# Patient Record
Sex: Female | Born: 1945 | Race: White | Hispanic: No | Marital: Married | Smoking: Former smoker
Health system: Southern US, Community
[De-identification: ages and names within clinical notes are randomized; demographics above are authoritative.]

## PROBLEM LIST (undated history)

## (undated) DIAGNOSIS — M47817 Spondylosis without myelopathy or radiculopathy, lumbosacral region: Secondary | ICD-10-CM

## (undated) DIAGNOSIS — M48061 Spinal stenosis, lumbar region without neurogenic claudication: Secondary | ICD-10-CM

## (undated) DIAGNOSIS — G479 Sleep disorder, unspecified: Secondary | ICD-10-CM

## (undated) DIAGNOSIS — G4730 Sleep apnea, unspecified: Secondary | ICD-10-CM

## (undated) DIAGNOSIS — I10 Essential (primary) hypertension: Secondary | ICD-10-CM

## (undated) DIAGNOSIS — R159 Full incontinence of feces: Secondary | ICD-10-CM

## (undated) DIAGNOSIS — R519 Headache, unspecified: Secondary | ICD-10-CM

## (undated) DIAGNOSIS — Q675 Congenital deformity of spine: Secondary | ICD-10-CM

## (undated) DIAGNOSIS — D649 Anemia, unspecified: Secondary | ICD-10-CM

## (undated) DIAGNOSIS — M545 Low back pain, unspecified: Secondary | ICD-10-CM

## (undated) DIAGNOSIS — F32A Depression, unspecified: Secondary | ICD-10-CM

## (undated) HISTORY — PX: COLONOSCOPY: SHX174

## (undated) HISTORY — DX: Spondylosis without myelopathy or radiculopathy, lumbosacral region: M47.817

## (undated) HISTORY — PX: AUGMENTATION MAMMAPLASTY: SUR837

## (undated) HISTORY — DX: Spinal stenosis, lumbar region without neurogenic claudication: M48.061

## (undated) HISTORY — DX: Full incontinence of feces: R15.9

## (undated) HISTORY — DX: Congenital deformity of spine: Q67.5

## (undated) HISTORY — PX: BREAST EXCISIONAL BIOPSY: SUR124

## (undated) HISTORY — DX: Low back pain: M54.5

## (undated) HISTORY — DX: Low back pain, unspecified: M54.50

## (undated) HISTORY — PX: OTHER SURGICAL HISTORY: SHX169

---

## 2006-08-20 ENCOUNTER — Encounter: Admission: RE | Admit: 2006-08-20 | Discharge: 2006-09-21 | Payer: Self-pay | Admitting: Family Medicine

## 2006-08-23 ENCOUNTER — Encounter: Admission: RE | Admit: 2006-08-23 | Discharge: 2006-08-23 | Payer: Self-pay | Admitting: Family Medicine

## 2006-11-05 ENCOUNTER — Other Ambulatory Visit: Admission: RE | Admit: 2006-11-05 | Discharge: 2006-11-05 | Payer: Self-pay | Admitting: Family Medicine

## 2006-11-16 ENCOUNTER — Encounter: Admission: RE | Admit: 2006-11-16 | Discharge: 2006-11-16 | Payer: Self-pay | Admitting: Family Medicine

## 2007-08-26 ENCOUNTER — Encounter: Admission: RE | Admit: 2007-08-26 | Discharge: 2007-08-26 | Payer: Self-pay | Admitting: Family Medicine

## 2007-12-11 ENCOUNTER — Other Ambulatory Visit: Admission: RE | Admit: 2007-12-11 | Discharge: 2007-12-11 | Payer: Self-pay | Admitting: Family Medicine

## 2007-12-12 ENCOUNTER — Encounter: Admission: RE | Admit: 2007-12-12 | Discharge: 2007-12-23 | Payer: Self-pay | Admitting: Orthopedic Surgery

## 2007-12-17 ENCOUNTER — Ambulatory Visit (HOSPITAL_COMMUNITY): Admission: RE | Admit: 2007-12-17 | Discharge: 2007-12-17 | Payer: Self-pay | Admitting: Family Medicine

## 2007-12-20 ENCOUNTER — Encounter
Admission: RE | Admit: 2007-12-20 | Discharge: 2008-03-19 | Payer: Self-pay | Admitting: Physical Medicine & Rehabilitation

## 2007-12-23 ENCOUNTER — Ambulatory Visit: Payer: Self-pay | Admitting: Physical Medicine & Rehabilitation

## 2007-12-26 ENCOUNTER — Encounter: Admission: RE | Admit: 2007-12-26 | Discharge: 2007-12-26 | Payer: Self-pay | Admitting: Family Medicine

## 2007-12-26 ENCOUNTER — Other Ambulatory Visit: Admission: RE | Admit: 2007-12-26 | Discharge: 2007-12-26 | Payer: Self-pay | Admitting: Interventional Radiology

## 2007-12-26 ENCOUNTER — Encounter (INDEPENDENT_AMBULATORY_CARE_PROVIDER_SITE_OTHER): Payer: Self-pay | Admitting: Interventional Radiology

## 2007-12-26 ENCOUNTER — Ambulatory Visit: Payer: Self-pay | Admitting: Physical Medicine & Rehabilitation

## 2008-01-06 ENCOUNTER — Ambulatory Visit: Payer: Self-pay | Admitting: Physical Medicine & Rehabilitation

## 2008-01-13 ENCOUNTER — Ambulatory Visit: Payer: Self-pay | Admitting: Physical Medicine & Rehabilitation

## 2008-01-16 ENCOUNTER — Ambulatory Visit: Payer: Self-pay | Admitting: Physical Medicine & Rehabilitation

## 2008-01-23 ENCOUNTER — Ambulatory Visit: Payer: Self-pay | Admitting: Physical Medicine & Rehabilitation

## 2008-02-13 ENCOUNTER — Ambulatory Visit: Payer: Self-pay | Admitting: Physical Medicine & Rehabilitation

## 2008-03-13 ENCOUNTER — Ambulatory Visit: Payer: Self-pay | Admitting: Physical Medicine & Rehabilitation

## 2008-04-20 ENCOUNTER — Encounter
Admission: RE | Admit: 2008-04-20 | Discharge: 2008-04-20 | Payer: Self-pay | Admitting: Physical Medicine & Rehabilitation

## 2008-04-20 ENCOUNTER — Ambulatory Visit: Payer: Self-pay | Admitting: Physical Medicine & Rehabilitation

## 2008-07-15 ENCOUNTER — Encounter
Admission: RE | Admit: 2008-07-15 | Discharge: 2008-07-15 | Payer: Self-pay | Admitting: Physical Medicine & Rehabilitation

## 2008-08-26 ENCOUNTER — Encounter: Admission: RE | Admit: 2008-08-26 | Discharge: 2008-08-26 | Payer: Self-pay | Admitting: Family Medicine

## 2008-09-17 ENCOUNTER — Ambulatory Visit: Payer: Self-pay | Admitting: Physical Medicine & Rehabilitation

## 2008-09-17 ENCOUNTER — Encounter
Admission: RE | Admit: 2008-09-17 | Discharge: 2008-09-17 | Payer: Self-pay | Admitting: Physical Medicine & Rehabilitation

## 2009-01-18 ENCOUNTER — Ambulatory Visit: Payer: Self-pay | Admitting: Physical Medicine & Rehabilitation

## 2009-01-18 ENCOUNTER — Encounter
Admission: RE | Admit: 2009-01-18 | Discharge: 2009-04-18 | Payer: Self-pay | Admitting: Physical Medicine & Rehabilitation

## 2009-04-12 ENCOUNTER — Ambulatory Visit: Payer: Self-pay | Admitting: Physical Medicine & Rehabilitation

## 2009-06-29 ENCOUNTER — Encounter
Admission: RE | Admit: 2009-06-29 | Discharge: 2009-07-05 | Payer: Self-pay | Source: Home / Self Care | Admitting: Physical Medicine & Rehabilitation

## 2009-07-05 ENCOUNTER — Ambulatory Visit: Payer: Self-pay | Admitting: Physical Medicine & Rehabilitation

## 2009-09-06 ENCOUNTER — Encounter: Admission: RE | Admit: 2009-09-06 | Discharge: 2009-09-06 | Payer: Self-pay | Admitting: Family Medicine

## 2010-04-21 ENCOUNTER — Other Ambulatory Visit: Payer: Self-pay | Admitting: Family Medicine

## 2010-04-21 ENCOUNTER — Other Ambulatory Visit (HOSPITAL_COMMUNITY)
Admission: RE | Admit: 2010-04-21 | Discharge: 2010-04-21 | Disposition: A | Discharge status: Home / Self Care | Payer: Federal, State, Local not specified - PPO | Source: Ambulatory Visit | Attending: Family Medicine | Admitting: Family Medicine

## 2010-04-21 DIAGNOSIS — Z124 Encounter for screening for malignant neoplasm of cervix: Secondary | ICD-10-CM | POA: Insufficient documentation

## 2010-05-24 NOTE — Procedures (Signed)
[name], EBRIGHT NO.:  [id number]   MEDICAL RECORD NO.:  [id number]          PATIENT TYPE:  SPE   LOCATION:  [facility]                         FACILITY:  [facility]   PHYSICIAN:  Erick Colace, M.D.DATE OF BIRTH:  [birth year]   DATE OF PROCEDURE:  12/26/2007  DATE OF DISCHARGE:  12/26/2007                               OPERATIVE REPORT   This 65 year old female with history of scoliosis with back pain here  for acupuncture treatment.  This is treatment #3.  Last treatment done  December 26, 2007.   No new intercurrent medical problems.  Needle was placed at BL23, BL24,  BL52, DU4, and right lower extremity SP6 GB39, GB34 with e-stim between  GB34 and GB39, 4 Hz stimulation x30 minutes.  The patient tolerated  procedure well.  Post-procedure instructions given.  Return in 1 week.      Erick Colace, M.D.  Electronically Signed     AEK/MEDQ  D:  01/06/2008 15:18:39  T:  01/07/2008 02:26:15  Job:  [id number]

## 2010-05-24 NOTE — Consult Note (Signed)
A 65 year old female who has a history of scoliosis even as a teenager,  but has had some progressive pain over the last several years along with  right lower extremity pain and tingling as well.  She has seen  orthopedic surgeon in [place] couple of years ago when she lived  there and he recommended laminectomy.  Prior to getting laminectomy, he  had her go to a acupuncturist and in fact after twice a week treatments  for about 5 weeks, she had resolution of her pain to the point where she  decided not to go through with her surgery.  She has then moved to [place] with her husband.  She is retired.  She has used intermittent  morphine with the last dosage about 3 weeks ago to help with her pain in  addition to using Motrin.   Her past medical history is significant for GERD, depression, panic  attacks, hypertension, high cholesterol, and arthritis.   Current meds include Pristiq, Abilify, Crestor, hydrochlorothiazide,  Neurontin, and Prilosec.  She is also taking Naprelan 2 a day.   Her Oswestry disability index is 46%.   Pain level was 6/10 on average, but currently only 2.  Pain interferes  with activity at a 7/10 level.  Walking tolerance is about 10 minutes.  She needs assistance with certain shopping activities.   REVIEW OF SYSTEMS:  Positive depression, anxiety, and coughing as well  as weight gain due to poor exercise per her report.   PAST SURGICAL HISTORY:  Bunionectomies 1985 and 1995, breast  augmentation in the past.   SOCIAL HISTORY:  Married.  Lives with her husband.  Drinks 1-2 glasses  of wine a night.   FAMILY HISTORY:  Diabetes, high blood pressure, psychiatric problems,  alcohol abuse.   PHYSICAL EXAMINATION:  GENERAL:  Overweight female in no acute distress.  EXTREMITIES:  Without edema.  Orientation x3.  Affect is alert and  bright.  Gait is normal.  She is able to toe walk, heel walk, but has a  forward-flexed posture.  Coordination is normal  in bilateral upper and  lower extremities.  Deep tendon reflexes are normal in bilateral upper  and lower extremities.  Right lower extremity sensation reduced in the  L4 and L5 dermatomes.  She has normal range of motion and joint  stability in her upper and lower extremities and 5/5 strength in  bilateral upper and lower extremities.  Lumbar spine range of motion,  full range, forward flexion; however, extension is limited to 0 to 25%.  Her spine does have some evidence of scoliosis.  She has mild tenderness  to palpation of lumbar paraspinals.   IMPRESSION:  Scoliosis as well as lumbar stenosis.  I believe she has  some facet-mediated pain as well as right lower extremity radicular pain  corresponding L4 and L5 levels.   We discussed her acupuncture treatments in the past.  She did well using  twice a week treatments.  She did not use E-Stim with that particular  treatment.  She had some subsequent treatments using E-Stim with  chiropractor, some different points were trailed, did not do quite as  well.  She states the [place] acupuncturist did some manual type of  therapy as well.   We will initiate treatment.  We will do Ming-Men treatments a day and  scheduled for twice a week treatments for 3 weeks as a trial.   Discussed with the patient, she elects to proceed, 20-minute treatment  today, subsequent treatments will be scheduled for 30 minutes.      Erick Colace, M.D.  Electronically Signed     AEK/MedQ  D:12/23/2007 13:09:37  T:12/24/2007 03:39:07  Job #:  [id number]   cc:   Caralyn Guile. Ethelene Hal, M.D.  Fax: [phone]   Alvy Beal, MD  Fax: [phone]

## 2010-05-24 NOTE — Procedures (Signed)
NAMEDONNITA, [name] NO.:  [id number]   MEDICAL RECORD NO.:  [id number]           PATIENT TYPE:   LOCATION:                                 FACILITY:   PHYSICIAN:  Erick Colace, M.D.DATE OF BIRTH:  07-Jun-1945   DATE OF PROCEDURE:  04/20/2008  DATE OF DISCHARGE:                               OPERATIVE REPORT   PROCEDURE:  Bilateral L5 dorsal ramus injection, bilateral L4 medial  branch block, bilateral L3 medial branch block under fluoroscopic  guidance.   INDICATIONS:  Lumbar pain which was previously relieved for 6-7 weeks  with medial branch blocks at corresponding levels.   The pain is only partially responsive to medication management and  interferes with self-care mobility.   Informed consent was obtained after describing the risks and benefits of  the procedure with the patient.  These include bleeding, bruising, and  infection.  She elects to proceed and has given written consent.   The patient was placed prone on fluoroscopy table.  Betadine prep,  sterile drape.  A 25-gauge 1-1/2-inch needle was used to anesthetize the  skin and subcutaneous tissue, 1% lidocaine x2 mL at each of 6 sites.  Then, a 22-gauge 3-1/2-inch spinal needle was inserted under  fluoroscopic guidance targeting left S1 SAP sacral ala junction.  Bone  contact made, confirmed with lateral imaging.  Omnipaque 180 x0.5 mL demonstrated no intravascular uptake.  Then, 0.5  mL of dexamethasone and lidocaine solution was injected.  Then, the left  L5 SAP transverse junction targeted.  Bone contact made, confirmed with  lateral imaging.  Omnipaque 180 x0.5 mL demonstrated no intravascular  uptake.  Then, 0.5 mL of dexamethasone and lidocaine solution injected.  Then, the left L4 SAP transverse junction targeted.  Bone contact made,  confirmed with lateral imaging.  Omnipaque 180 x0.5 mL demonstrated no  intravascular uptake.  Then, 0.5 mL of dexamethasone and lidocaine  solution  was injected.  The same procedure was repeated on the right  side using the same needle and injectate technique.  The patient  tolerated procedure well.  Pre- and post-injection vitals stable.  Post-  injection instructions given.  Pre-injection pain level 4/10, post-  injection 0/10.  Return in 3 months for reinjection.  If she has a  recurrence prior to 6 weeks post-injection, we will set her up for  radiofrequency neurotomy.      Erick Colace, M.D.  Electronically Signed     AEK/MEDQ  D:  04/20/2008 12:18:05  T:  04/21/2008 02:15:38  Job:  [id number]

## 2010-05-24 NOTE — Assessment & Plan Note (Signed)
HISTORY OF PRESENT ILLNESS:  Ms. Freeman follows up today.  She was  thinking, she was here for radiofrequency neurotomy; however, she  continues to have pain relief and only has 0-1 pain currently.  She has  had good relief again with the lumbar medial branch block, L5 dorsal  ramus, L4 medial branch, L3 medial branch bilaterally performed February 13, 2008.   She can cook for a few hours at a time.  Her main pain is when she is in  a bent forward position for a prolonged of period time.  She walks  without assistive device.  She has turned to working out in a pool.  She  climbs steps.  She drives.   REVIEW OF SYSTEMS:  Positive for depression, but otherwise negative.   PHYSICAL EXAMINATION:  VITAL SIGNS:  Her blood pressure is 142/92, pulse  74, respirations 18, O2 sat 97% room air.  GENERAL:  In no acute stress.  Orientation x3.  Affect bright and alert.  Gait is normal.  She is able to toe walk, heel walk.  Her back has no tenderness to  palpation.  Lower extremity strength and range of motion are normal.  Deep tendon reflexes normal.   IMPRESSION:  Chronic low back pain, facet mediated, good response to  medial branch blocks.  At this point, do not know how long this is going  to last her and if she has a prolonged effect from this, she may be a  good candidate for repeat MBB every 3 months or so.  If she has a  shorter response, i.e. 6 weeks or less, would be a good candidate for  radiofrequency neurotomy.  I will see her back in another 6 weeks.  We  will see how she is doing.  If she has gradually returning pain at that  point, we will repeat MBB.  If she has a more pronounce return of pain  within the next couple weeks, we will schedule her for radiofrequency  procedure.      Erick Colace, M.D.  Electronically Signed     AEK/MedQ  D:  03/13/2008 16:10:07  T:  03/14/2008 16:10:96  Job #:  [id number]

## 2010-05-24 NOTE — Procedures (Signed)
[name], [name] NO.:  [id number]   MEDICAL RECORD NO.:  [id number]           PATIENT TYPE:   LOCATION:                                 FACILITY:   PHYSICIAN:  Erick Colace, M.D.DATE OF BIRTH:  Sep 12, 1945   DATE OF PROCEDURE:  DATE OF DISCHARGE:                               OPERATIVE REPORT   Acupuncture treatment.  Treatment #5 today.  She has had good results  with the right lower extremity pain, but continues to have back pain.   Needles placed bilaterally, BL23, BL24, BL52, as well as DU4 and GB34,  GB39, and SP6 with E-Stim between SP6 and GB39 as well as between the  ming-men points.   The patient tolerated the procedure well.  Postinjection instructions  given.      Erick Colace, M.D.  Electronically Signed     AEK/MEDQ  D:  01/13/2008 14:58:03  T:  01/14/2008 04:44:03  Job:  [id number]

## 2010-05-24 NOTE — Procedures (Signed)
[name], [name] NO.:  [id number]   MEDICAL RECORD NO.:  [id number]           PATIENT TYPE:   LOCATION:                                 FACILITY:   PHYSICIAN:  Erick Colace, M.D.   DATE OF BIRTH:   DATE OF PROCEDURE:  DATE OF DISCHARGE:                               OPERATIVE REPORT   PROCEDURE:  Acupuncture treatment.   INDICATIONS:  Lumbar pain as well as right lower extremity pain.   Needles placed at ming-men site with additional placement of BL 24  bilaterally.  In addition, in the lower extremity needles placed at GB  31, GB 39, and SP 6, E-Stim between SP 6 and GB 39.  The patient  tolerated the procedure well.  Post procedure instructions given.  Return next week.      Erick Colace, M.D.  Electronically Signed     AEK/MEDQ  D:  12/26/2007 10:23:14  T:  12/27/2007 04:07:10  Job:  [id number]

## 2010-05-24 NOTE — Assessment & Plan Note (Signed)
Ms. Summa returns today.  She has had good results with the radicular  pain in regards to the acupuncture; however, continues to have back pain  mainly with activity.  Standing and walking seem to bother it most.  Her  walking tolerance is only about 15 minutes because of the pain, she sits  down and her pain is relieved.  Her current pain is 2/10, average is 7.   REVIEW OF SYSTEMS:  Positive for depression.  She states she has gained  weight because her back hurts and she cannot exercise.   PHYSICAL EXAMINATION:  VITAL SIGNS:  Blood pressure is 143/82, pulse 83,  respiratory rate 18, and O2 sat 96% on room air.  GENERAL:  No acute distress.  She is obese.  EXTREMITIES:  Without edema.  NEUROLOGIC:  Mood and affect is appropriate.  Her gait is normal.   IMPRESSION:  Lumbar pain.  I looked over at some of her previous records  from [facility].  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   We discussed the treatment options including to continue with the  acupuncture, which seems to be helping with radicular component, but not  with the actual component of her pain, which is particularly worse with  prolonged standing.  I discussed all of medial branch block and its  further diagnostic procedure and she would like to proceed with this.  I  will schedule for this in the next week or two.  I already gave her some  literature on this.      Erick Colace, M.D.  Electronically Signed     AEK/MedQ  D:  01/13/2008 15:01:27  T:  01/14/2008 05:57:08  Job #:  [id number]

## 2010-05-24 NOTE — Procedures (Signed)
NAMEANMOL, PASCHEN NO.:  [id number]   MEDICAL RECORD NO.:  [id number]           PATIENT TYPE:   LOCATION:                                 FACILITY:   PHYSICIAN:  Erick Colace, M.D.DATE OF BIRTH:  Oct 24, 1945   DATE OF PROCEDURE:  DATE OF DISCHARGE:                               OPERATIVE REPORT   Acupuncture treatment number 6 has had good results with right lower  extremity pain.  Continues to have some back pain, although this has  improved to certain extent in the mid to upper back.  Having some right  buttock pain.   Needles placed bilaterally at BL23, BL24, BL52 as well as DU4 as well as  right GB34, right GB39, right SP6, and two needles at right GB30, 4 Hz  stimulation between the points with the exception of DU4, 30 minutes  treatment time.  The patient tolerated the procedure well.  The patient  has pain that interferes with prolonged standing and walking and only  partially responsive to medication management.  We would estimate she  will need continued acupuncture treatment week x1 more week and then  start spreading her out to  every 2 weeks to see if we can maintain at  that level and proceed from there.      Erick Colace, M.D.  Electronically Signed     AEK/MEDQ  D:  01/16/2008 17:39:26  T:  01/17/2008 05:00:30  Job:  [id number]

## 2010-05-24 NOTE — Procedures (Signed)
[name], [name] NO.:  [id number]   MEDICAL RECORD NO.:  [id number]           PATIENT TYPE:   LOCATION:                                 FACILITY:   PHYSICIAN:  Erick Colace, M.D.DATE OF BIRTH:  07-30-1945   DATE OF PROCEDURE:  01/23/2008  DATE OF DISCHARGE:                               OPERATIVE REPORT   PROCEDURE:  Bilateral L5 dorsal ramus injection, bilateral L4 medial  branch block, bilateral L3 medial branch block under fluoroscopic  guidance.   INDICATIONS:  Lumbar pain, lumbago, 724.2.   Informed consent was obtained after describing risks and benefits of the  procedure with the patient.  These include bleeding, bruising,  infection.  She elects to proceed and has given written consent.  The  patient placed prone on fluoroscopy table.  Betadine prep, sterile  drape.  A 25-gauge inch and a half needle was used to anesthetize the  skin and subcutaneous tissue, 1% lidocaine x2 mL.  Then, a 22-gauge 3-  1/2-inch spinal needle was inserted under fluoroscopic guidance first  targeting the left S1 SAP sacral ala junction, bone contact made,  confirmed on lateral imaging.  Omnipaque 180 x 0.5 mL demonstrated no  intravascular uptake.  Then, 0.5 mL of a solution containing 1 mL of 4  mg/mL dexamethasone and 2 mL of 1% MPF lidocaine were injected.  Then,  the left L5 SAP transverse process junction targeted, bone contact made,  confirmed.  Omnipaque 180 under live fluoro demonstrated no  intravascular uptake.  Then, 0.5 mL of a dexamethasone lidocaine  solution was injected into the left L4 SAP transverse process junction  targeted, bone contact made, confirmed with lateral imaging.  Omnipaque  180 x0.5 mL demonstrated no intravascular uptake.  Then, 0.5 mL of a  dexamethasone lidocaine solution was injected.  The same procedure was  repeated on the right side using same needle injectate and technique.  At the L5-S1 level, needle was switched out to  a 5-inch 22-gauge needle  due to the depth of adipose tissue.   The patient tolerated the procedure well.  Pre and postinjection vitals  stable.  Postinjection instructions given.  Preinjection pain level  4/10.  Postinjection pain level 0/10.  We will schedule back in 3 weeks  for repeat procedure.      Erick Colace, M.D.  Electronically Signed     AEK/MEDQ  D:  01/23/2008 08:53:40  T:  01/23/2008 22:04:17  Job:  [id number]

## 2010-05-24 NOTE — Procedures (Signed)
[name], [name] NO.:  [id number]   MEDICAL RECORD NO.:  [id number]          PATIENT TYPE:  SPE   LOCATION:  [facility]                         FACILITY:  [facility]   PHYSICIAN:  Erick Colace, M.D.DATE OF BIRTH:  Feb 07, 1945   DATE OF PROCEDURE:  02/13/2008  DATE OF DISCHARGE:  12/26/2007                               OPERATIVE REPORT   PROCEDURE:  Bilateral L5 dorsal ramus injection, bilateral L4 medial  branch block, bilateral L3 medial branch block under fluoroscopic  guidance.   INDICATIONS:  Lumbar pain, lumbago of 724.2.   Informed consent was obtained after describing the risks and benefits of  the procedure with the patient.  These include bleeding, bruising,  infection.  She elects to proceed and has given written consent.  She  had good results with the last injection.  No complications.  Her pain  is coming back and she notes that she has had moderate pain with  standing now.   The patient placed prone on fluoroscopy table.  Betadine prep, sterile  drape 25-gauge inch and half needle was used to anesthetize skin and  subcu tissue 1% lidocaine x2 mL and then a 22-gauge 2-1/2-inch spinal  needle was inserted under fluoroscopic guidance, first charting at the  left S1 SAP sacroiliac junction.  Bone contact made and confirmed with  lateral imaging.  Omnipaque 180 x 0.5 mL demonstrated no intravascular  uptake, then 0.5 mL of a solution containing 1 mL of 4 mL dexamethasone  and 2 mL of 2% MPF lidocaine, then the left L5 SAP transverse process  junction targeted, bone contact made, confirmed with lateral imaging.  Omnipaque 180 x 0.5 mL demonstrated no intravascular uptake, then 0.5 mL  of dexamethasone lidocaine solution was injected, then left L4 SAP  transverse process junction targeted, bone contact made, confirmed with  lateral imaging.  Omnipaque 180 x 0.5 mL demonstrated no intravascular  uptake, then 0.5 mL of the dexamethasone lidocaine  solution was  injected.  The same procedure was repeated on the right side using same  needle and injectate and technique.  She did have a 5-inch 42-gauge  needle at L5-S1 level to compensate for the increased depth of the  adipose tissue at that level.   Pre and post-injection vitals stable.  Post-injection instructions  given.  I will see her back in 1 month.  We will follow up on how she  did with this and decide whether to proceed with radiofrequency  neurotomy based on her results.      Erick Colace, M.D.  Electronically Signed     AEK/MEDQ  D:  02/13/2008 11:14:19  T:  02/14/2008 00:02:48  Job:  [id number]

## 2010-05-27 NOTE — Procedures (Signed)
[name], [name]             ACCOUNT NO.:  [id number]   MEDICAL RECORD NO.:  [id number]           PATIENT TYPE:   LOCATION:                                 FACILITY:   PHYSICIAN:  Erick Colace, M.D.DATE OF BIRTH:  10/12/1945   DATE OF PROCEDURE:  09/17/2008  DATE OF DISCHARGE:                               OPERATIVE REPORT   PROCEDURE:  Bilateral L5 dorsal ramus injection, bilateral L4 medial  branch block, bilateral L3 medial branch block under fluoroscopic  guidance.   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  Her pain is in the 7/10 range right now.  Interferes with activity at a  6/10 range.  Her pain is worse with walking and sitting, and persists  despite medication management, other conservative care including an  exercise program.   Informed consent was obtained after describing the risks and benefits of  the procedure with the patient.  These include bleeding, bruising, and  infection.  She elects to proceed and has given written consent.  The  patient was placed prone on fluoroscopy table.  Betadine prep, sterile  draped.  A 25-gauge, 1-1/2-inch needle was used to anesthetize the skin  and subcutaneous tissue with 1% lidocaine x2 mL and a 22-gauge, 3-1/2-  inch spinal needle was inserted under fluoroscopic guidance, first  starting the left S1 SAP sacral ala junction, bone contact made  confirmed with lateral imaging.  Omnipaque 180 under live fluoro  demonstrated no intravascular uptake, then 0.5 mL of Omnipaque 180  injected under live fluoro followed by showing no intravascular uptake,  followed by injection of 0.5 mL with solution containing 1 mL of 4 mg/mL  dexamethasone and 2 mL of 2% MPF lidocaine.  Then, left L5 SAP  transverse process junction targeted, bone contact made confirmed with  lateral imaging.  Omnipaque 180 under live fluoro demonstrated no  intravascular uptake, then 0.5 mL of dexamethasone-lidocaine  solution  injected.  Then, left L4 SAP transverse process junction targeted, bone  contact made.  Omnipaque 180 x 0.5 mL demonstrated no intravascular  uptake.  Then, 0.5 mL of dexamethasone-lidocaine solution was injected.  Same procedure was repeated on the right side on corresponding levels,  same needle, injectate and technique.  The patient tolerated the  procedure well.  Pre- and post-injection and vitals stable.  Post-  injection instructions given.      Erick Colace, M.D.  Electronically Signed     AEK/MEDQ  D:  09/17/2008 10:36:40  T:  09/18/2008 01:32:30  Job:  [id number]   cc:   Alvy Beal, MD  Fax: [phone]

## 2010-10-03 ENCOUNTER — Other Ambulatory Visit: Payer: Self-pay | Admitting: Family Medicine

## 2010-10-03 DIAGNOSIS — Z1231 Encounter for screening mammogram for malignant neoplasm of breast: Secondary | ICD-10-CM

## 2010-10-12 ENCOUNTER — Ambulatory Visit
Admission: RE | Admit: 2010-10-12 | Discharge: 2010-10-12 | Disposition: A | Discharge status: Home / Self Care | Payer: Federal, State, Local not specified - PPO | Source: Ambulatory Visit | Attending: Family Medicine | Admitting: Family Medicine

## 2010-10-12 DIAGNOSIS — Z1231 Encounter for screening mammogram for malignant neoplasm of breast: Secondary | ICD-10-CM

## 2011-01-10 HISTORY — PX: SHOULDER SURGERY: SHX246

## 2011-02-10 ENCOUNTER — Encounter: Payer: Federal, State, Local not specified - PPO | Attending: Physical Medicine & Rehabilitation

## 2011-02-10 ENCOUNTER — Ambulatory Visit (HOSPITAL_BASED_OUTPATIENT_CLINIC_OR_DEPARTMENT_OTHER): Payer: Federal, State, Local not specified - PPO | Admitting: Physical Medicine & Rehabilitation

## 2011-02-10 DIAGNOSIS — M79609 Pain in unspecified limb: Secondary | ICD-10-CM | POA: Insufficient documentation

## 2011-02-10 DIAGNOSIS — Z79899 Other long term (current) drug therapy: Secondary | ICD-10-CM | POA: Insufficient documentation

## 2011-02-10 DIAGNOSIS — M47817 Spondylosis without myelopathy or radiculopathy, lumbosacral region: Secondary | ICD-10-CM | POA: Insufficient documentation

## 2011-02-10 DIAGNOSIS — M545 Low back pain, unspecified: Secondary | ICD-10-CM | POA: Insufficient documentation

## 2011-02-10 NOTE — Assessment & Plan Note (Signed)
REASON FOR VISIT:  Back pain and bilateral lower extremity pain.  HISTORY:  [name] is a 66 year old female with lumbar scoliosis as well as lumbar spondylosis, particularly affecting L4-5 and L5-S1 levels, left greater than right side.  I last saw her when I did a radiofrequency neurotomy on July 05, 2009.  She had 15 months relief with this and now has had recurrence of pain.  In addition, she has developed increasing lower extremity pain.  She has had some foot problems, Achilles heel problems, treated by a podiatrist, but also has pain that seems to radiate from the back toward the feet.  She has been evaluated by Dr. Venita Lick and was not felt to be a surgical candidate at the current time, but may need it in the future.  She has had no epidurals.  She has had no new surgeries other than shoulder surgery.  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  MEDICATIONS: 1. Pristiq 100 mg a day. 2. Naproxen 500 mg b.i.d. 3. Losartan 10 mg daily. 4. Crestor 20 mg daily. 5. Gabapentin 600 mg 3 p.o. at bedtime. 6. Lamotrigine 1 p.o. daily.  Her average pain is 7/10, currently at 2.  Pain increases with walking, sitting and standing.  Walking tolerance is 15 minutes.  She can climb steps.  She can drive.  She needs assist with certain household duties, but otherwise is independent.  She has trouble walking, depression and anxiety under review of systems.  SOCIAL HISTORY:  Married, lives with her husband.  Two glass of wine a day.  FAMILY HISTORY:  Diabetes and hypertension.  PHYSICAL EXAMINATION:  VITAL SIGNS:  Blood pressure 134/87, pulse 80, respirations 18 and O2 sat 94% on room air.  Height 5 feet, 6 inches, 204 pounds. GENERAL:  Obese female, in no acute distress.  Orientation x3.  Affect is alert.  Gait is with a limp.  BACK:  She has a C-shaped curve cone back to the right thoracolumbar area.  She has negative straight  leg raising test. EXTREMITIES:  Knees have good range of motion.  She has decreased internal rotation on the right lower extremity at the hip.  Ankle does have some bony swelling at the insertion of the Achilles on the right side medially.  She has no tenderness of the peroneal tendons.  She has good pulses of the feet. Sensation is normal in bilateral lower extremities to pinprick and light touch.  Deep tendon reflexes are normal.  IMPRESSION:  Lumbar spondylosis.  I believe this is the primary etiology of her pain.  However, she does have some signs that are suspicious for radiculopathy as well.  PLAN:  We will repeat medial branch blocks to see whether the majority of her back pain is at least short-term relieved.  We will need to do confirmatory said as well before we do a repeat RF.  Discussed with the patient, agrees with plan.  In terms of her sciatic symptoms, we will see what slapped after we do the medial branch blocks in terms of residual pain.  I will also like to review her MRI performed at [facility].  Discussed with the patient, agrees with plan.     Erick Colace, M.D. Electronically Signed    AEK/MedQ D:  02/10/2011 12:13:31  T:  02/10/2011 17:41:18  Job #:  [id number]

## 2011-02-20 ENCOUNTER — Encounter: Payer: Federal, State, Local not specified - PPO | Admitting: Physical Medicine & Rehabilitation

## 2011-02-23 ENCOUNTER — Encounter: Payer: Federal, State, Local not specified - PPO | Admitting: Physical Medicine & Rehabilitation

## 2011-02-23 DIAGNOSIS — M47817 Spondylosis without myelopathy or radiculopathy, lumbosacral region: Secondary | ICD-10-CM

## 2011-02-23 NOTE — Procedures (Signed)
NAMEMANROOP, JAKUBOWICZ             ACCOUNT NO.:  [id number]  MEDICAL RECORD NO.:  [id number]           PATIENT TYPE:  O  LOCATION:  [facility]                          FACILITY:  [facility]  PHYSICIAN:  Erick Colace, M.D.DATE OF BIRTH:  [birth year]  DATE OF PROCEDURE: DATE OF DISCHARGE:                              OPERATIVE REPORT  PROCEDURE:  Bilateral L5 dorsal ramus injection, bilateral L4 medial branch block, bilateral L3 medial branch block under fluoroscopic guidance.  INDICATION:  Lumbar spondylosis with back pain that is only partially responsive to medication management, other conservative care and interferes with self-care and mobility.  Informed consent was obtained after describing risks and benefits of the procedure with patient.  These include bleeding, bruising, and infection.  She elects to proceed and has given written consent.  The patient is not on any anticoagulants or antibiotics at this time.  She has no allergies to the medications we will be injecting.  The patient placed prone on fluoroscopy table.  Betadine prep, sterile drape, a 25-gauge inch and half needle was used to anesthetize the skin and subcutaneous tissue with 1% lidocaine 1.5 mL at each of 6 sites. Then, a 22-gauge 3.5 inch spinal needle was inserted, first starting left S1 SAP sacral junction bone contact made.  Omnipaque 180.5 mL demonstrated no intravascular uptake.  Then, 0.5 mL of the solution containing 1 mL of 4 mg/mL dexamethasone and 2 mL of 2% MPF lidocaine. Then, left L5 SAP transverse process junction targeted, bone contact made.  Omnipaque 180 x 0.5 mL demonstrated no intravascular uptake. Then, 0.5 mL of the dexamethasone lidocaine solution was injected. Then, the left L4 SAP transverse process junction targeted, bone contact made.  Omnipaque 180 x 0.5 mL demonstrated no intravascular uptake. Then, 0.5 mL of the dexamethasone lidocaine solution was injected.  The same procedure was  repeated on the right side using same needle, injectate, and technique at corresponding levels.  The patient tolerated procedure well.  Pre and post injection vitals stable.  Post injection instructions given.  Followup in 3 months for probable reinjection at that time.     Erick Colace, M.D. Electronically Signed    AEK/MEDQ  D:  02/23/2011 09:35:06  T:  02/23/2011 20:54:31  Job:  [id number]

## 2011-05-22 ENCOUNTER — Ambulatory Visit (HOSPITAL_BASED_OUTPATIENT_CLINIC_OR_DEPARTMENT_OTHER): Payer: Federal, State, Local not specified - PPO | Admitting: Physical Medicine & Rehabilitation

## 2011-05-22 ENCOUNTER — Encounter: Payer: Self-pay | Admitting: Physical Medicine & Rehabilitation

## 2011-05-22 ENCOUNTER — Encounter: Payer: Federal, State, Local not specified - PPO | Attending: Physical Medicine & Rehabilitation

## 2011-05-22 VITALS — BP 124/86 | HR 79 | Resp 16 | Ht 66.0 in | Wt 194.0 lb

## 2011-05-22 DIAGNOSIS — M47817 Spondylosis without myelopathy or radiculopathy, lumbosacral region: Secondary | ICD-10-CM | POA: Insufficient documentation

## 2011-05-22 DIAGNOSIS — Z79899 Other long term (current) drug therapy: Secondary | ICD-10-CM | POA: Insufficient documentation

## 2011-05-22 DIAGNOSIS — M545 Low back pain, unspecified: Secondary | ICD-10-CM | POA: Insufficient documentation

## 2011-05-22 DIAGNOSIS — M79609 Pain in unspecified limb: Secondary | ICD-10-CM | POA: Insufficient documentation

## 2011-05-22 NOTE — Patient Instructions (Signed)
We injected the small nerves that go to L4-5 and L5 -S1 joints We plan to repeat RF if your pain improves again but returns

## 2011-05-22 NOTE — Progress Notes (Signed)
Left Lumbar L3, L4  medial branch blocks and L 5 dorsal ramus injection under fluoroscopic guidance   Indication: Left Lumbar pain which is not relieved by medication management or other conservative care and interfering with self-care and mobility.  Informed consent was obtained after describing risks and benefits of the procedure with the patient, this includes bleeding, bruising, infection, paralysis and medication side effects.  The patient wishes to proceed and has given written consent.  The patient was placed in a prone position.  The lumbar area was marked and prepped with Betadine.  One mL of 1% lidocaine was injected into each of 3 areas into the skin and subcutaneous tissue.  Then a 22-gauge 3.5 spinal needle was inserted targeting the junction of the left S1 superior articular process and sacral ala junction.  Needle was advanced under fluoroscopic guidance.  Bone contact was made.  Omnipaque 180 was injected x 0.5 mL demonstrating no intravascular uptake.  Then a solution containing one mL of 4 mg per mL dexamethasone and 3 mL of 2% MPF lidocaine was injected x 0.5 mL.  Then the left L5 superior articular process in transverse process junction was targeted.  Bone contact was made.  Omnipaque 180 was injected x 0.5 mL demonstrating no intravascular uptake.  Then a solution containing one mL of 4 mg per mL dexamethasone and 3 mL of 2% MPF lidocaine was injected x 0.5 mL.  Then the left L4 superior articular process in transverse process junction was targeted.  Bone contact was made.  Omnipaque 180 was injected x 0.5 mL demonstrating no intravascular uptake.  Then a solution containing one mL of 4 mg per mL dexamethasone and 3 mL of 2% MPF lidocaine was injected x 0.5 mL.  Patient tolerated procedure well.  Post procedure instructions were given. 

## 2011-05-22 NOTE — Progress Notes (Signed)
  PROCEDURE RECORD [facility]   Name: [name] DOB:01/24/1945 MRN: [id number]  Date:05/22/2011  Physician: Claudette Laws, MD    Nurse/CMA: Noralyn Pick, CMA  Allergies: No Known Allergies  Consent Signed: yes  Is patient diabetic? no    Pregnant: no LMP: No LMP recorded. (age 64-55)  Anticoagulants: no Anti-inflammatory: no Antibiotics: no  Procedure: Medial Branch Block  Position: Prone Start Time: 9:55am  End Time: 10:02am  Fluoro Time: 20 seconds  RN/CMA Carroll,CMA Carroll,CMA    Time 9:35am 10:06am    BP 124/86 126/76    Pulse 79 74    Respirations 16 16    O2 Sat 93% 96%    S/S 6 6    Pain Level 2/10 0/10     D/C home with Joe (husband), patient A & O X 3, D/C instructions reviewed, and sits independently.

## 2011-05-23 ENCOUNTER — Telehealth: Payer: Self-pay | Admitting: Physical Medicine & Rehabilitation

## 2011-05-23 NOTE — Telephone Encounter (Signed)
Please give Dx CODES for Lumbar RF.

## 2011-05-24 NOTE — Telephone Encounter (Signed)
721.3

## 2011-07-10 HISTORY — PX: FOOT SURGERY: SHX648

## 2011-08-03 ENCOUNTER — Ambulatory Visit: Payer: Federal, State, Local not specified - PPO | Admitting: Physical Medicine & Rehabilitation

## 2011-08-29 ENCOUNTER — Encounter: Payer: Self-pay | Admitting: Physical Medicine & Rehabilitation

## 2011-08-29 ENCOUNTER — Ambulatory Visit (HOSPITAL_BASED_OUTPATIENT_CLINIC_OR_DEPARTMENT_OTHER): Payer: Federal, State, Local not specified - PPO | Admitting: Physical Medicine & Rehabilitation

## 2011-08-29 ENCOUNTER — Encounter: Payer: Federal, State, Local not specified - PPO | Attending: Physical Medicine & Rehabilitation

## 2011-08-29 VITALS — BP 122/86 | HR 78 | Resp 16 | Ht 66.0 in | Wt 194.0 lb

## 2011-08-29 DIAGNOSIS — M545 Low back pain, unspecified: Secondary | ICD-10-CM | POA: Insufficient documentation

## 2011-08-29 DIAGNOSIS — M47817 Spondylosis without myelopathy or radiculopathy, lumbosacral region: Secondary | ICD-10-CM

## 2011-08-29 DIAGNOSIS — M79609 Pain in unspecified limb: Secondary | ICD-10-CM | POA: Insufficient documentation

## 2011-08-29 DIAGNOSIS — Z79899 Other long term (current) drug therapy: Secondary | ICD-10-CM | POA: Insufficient documentation

## 2011-08-29 NOTE — Progress Notes (Signed)
Left L5 dorsal ramus., left L4 and left L3 medial branch radio frequency neuropathy under fluoroscopic guidance  Indication: Low back pain due to lumbar spondylosis which has been relieved on 2 occasions by greater than 50% by lumbar medial branch blocks at corresponding levels.  Informed consent was obtained after describing risks and benefits of the procedure with the patient, this includes bleeding, bruising, infection, paralysis and medication side effects. The patient wishes to proceed and has given written consent. The patient was placed in a prone position. The lumbar and sacral area was marked and prepped with Betadine. A 25-gauge 1-1/2 inch needle was inserted into the skin and subcutaneous tissue at 3 sites in one ML of 1% lidocaine was injected into each site. Then a 20-gauge 10 cm radio frequency needle with a 1 cm curved active tip was inserted targeting the left S1 SAP/sacral ala junction. Bone contact was made and confirmed with lateral imaging. Sensory stimulation at 50 Hz followed by motor stimulation at 2 Hz confirm proper needle location followed by injection of one ML of the solution containing one ML of 4 mg per mL dexamethasone and 3 mL of 1% MPF lidocaine. Then the left L5 SAP/transverse process junction was targeted. Bone contact was made and confirmed with lateral imaging. Sensory stimulation at 50 Hz followed by motor stimulation at 2 Hz confirm proper needle location followed by injection of one ML of the solution containing one ML of 4 mg per mL dexamethasone and 3 mL of 1% MPF lidocaine. Then the left L4 SAP/transverse process junction was targeted. Bone contact was made and confirmed with lateral imaging. Sensory stimulation at 50 Hz followed by motor stimulation at 2 Hz confirm proper needle location followed by injection of one ML of the solution containing one ML of 4 mg per mL dexamethasone and 3 mL of 1% MPF lidocaine. Radio frequency lesion being at 80C for 90 seconds was  performed. Needles were removed. Post procedure instructions and vital signs were performed. Patient tolerated procedure well. Followup appointment was given.  

## 2011-08-29 NOTE — Progress Notes (Signed)
  PROCEDURE RECORD [facility]   Name: [name] DOB:1945-03-13 MRN: [id number]  Date:08/29/2011  Physician: Claudette Laws, MD    Nurse/CMA: Redgie Grayer  Allergies: No Known Allergies  Consent Signed: yes  Is patient diabetic? no   Pregnant: no LMP: No LMP recorded. (age 64-55)  Anticoagulants: no Anti-inflammatory: no Antibiotics: no  Procedure: L Lumbar Radiofreqency  Position: Prone Start Time: 3:30pm  End Time: 3:50pm  Fluoro Time: 34 seconds  RN/CMA Carroll,CMA Carroll,CMA    Time 3:01pm 4:00pm    BP 122/86 134/88    Pulse 78 81    Respirations 16 16    O2 Sat 98% 96%    S/S 6 6    Pain Level 1/10 1/10     D/C home with Joe (husband), patient A & O X 3, D/C instructions reviewed, and sits independently.

## 2011-08-29 NOTE — Patient Instructions (Signed)
Radiofrequency Lesioning Care After Refer to this sheet in the next few weeks. These instructions provide you with information on caring for yourself after your procedure. Your caregiver may also give you more specific instructions. Your treatment has been planned according to current medical practices, but problems sometimes occur. Call your caregiver if you have any problems or questions after your procedure. HOME CARE INSTRUCTIONS  Take pain medicine as directed by your caregiver.   You may have pain from the burned nerve for a while after the procedure. This takes time to heal. Ask your caregiver how long you should expect pain.   You should be able to return to your normal activities a couple of days after the procedure. When you can go back to work will depend on the type of work you do. Discuss this with your caregiver.   Pay close attention to how you feel after the procedure. If you start having pain, write down when it hurts and how it feels. This will help you and your caregiver know if you need another treatment.  SEEK MEDICAL CARE IF:  The site where the needle was inserted for the procedure becomes red, swollen, or tender to touch.   Your pain does not get better.  SEEK IMMEDIATE MEDICAL CARE IF:  You develop sudden, severe pain.   You develop numbness or tingling near the procedure site.   You have a fever.  MAKE SURE YOU:  Understand these instructions.   Will watch your condition.   Will get help right away if you are not doing well or get worse.  Document Released: 08/24/2010 Document Revised: 12/15/2010 Document Reviewed: 08/24/2010 [employer] Patient Information 2012 [employer]. 

## 2011-09-05 ENCOUNTER — Other Ambulatory Visit: Payer: Self-pay | Admitting: Family Medicine

## 2011-09-05 DIAGNOSIS — Z1231 Encounter for screening mammogram for malignant neoplasm of breast: Secondary | ICD-10-CM

## 2011-10-13 ENCOUNTER — Ambulatory Visit
Admission: RE | Admit: 2011-10-13 | Discharge: 2011-10-13 | Disposition: A | Discharge status: Home / Self Care | Payer: Federal, State, Local not specified - PPO | Source: Ambulatory Visit | Attending: Family Medicine | Admitting: Family Medicine

## 2011-10-13 DIAGNOSIS — Z1231 Encounter for screening mammogram for malignant neoplasm of breast: Secondary | ICD-10-CM

## 2011-11-27 ENCOUNTER — Encounter: Payer: Self-pay | Admitting: Physical Medicine & Rehabilitation

## 2011-11-27 ENCOUNTER — Ambulatory Visit (HOSPITAL_BASED_OUTPATIENT_CLINIC_OR_DEPARTMENT_OTHER): Payer: Federal, State, Local not specified - PPO | Admitting: Physical Medicine & Rehabilitation

## 2011-11-27 ENCOUNTER — Encounter: Payer: Federal, State, Local not specified - PPO | Attending: Physical Medicine & Rehabilitation

## 2011-11-27 VITALS — BP 150/93 | HR 87 | Resp 14 | Ht 66.0 in | Wt 202.8 lb

## 2011-11-27 DIAGNOSIS — M545 Low back pain, unspecified: Secondary | ICD-10-CM | POA: Insufficient documentation

## 2011-11-27 DIAGNOSIS — Z79899 Other long term (current) drug therapy: Secondary | ICD-10-CM | POA: Insufficient documentation

## 2011-11-27 DIAGNOSIS — M4120 Other idiopathic scoliosis, site unspecified: Secondary | ICD-10-CM

## 2011-11-27 DIAGNOSIS — M47817 Spondylosis without myelopathy or radiculopathy, lumbosacral region: Secondary | ICD-10-CM | POA: Insufficient documentation

## 2011-11-27 DIAGNOSIS — M79609 Pain in unspecified limb: Secondary | ICD-10-CM | POA: Insufficient documentation

## 2011-11-27 DIAGNOSIS — M419 Scoliosis, unspecified: Secondary | ICD-10-CM

## 2011-11-27 NOTE — Patient Instructions (Signed)
Start stationary bicycling 15 minutes 3 times a week buildup 45 minutes every day if possible Try a heel lift in the right shoe See me in 3 month Drink plenty of liquids about 2 quarts a day High fiber diet Docusate sodium if not effective with the above

## 2011-11-27 NOTE — Progress Notes (Signed)
  Subjective:    Patient ID: [name], female    DOB: 1945/10/23, 66 y.o.   MRN: [id number]  HPI Foot surgery with Dr Victorino Dike Achilles lengthening procedure with calcaneal bursectomy.  July 2013 Last RF L L5, L4,L3,  Pain Inventory Average Pain 2 Pain Right Now 0 My pain is intermittent, sharp and stabbing  In the last 24 hours, has pain interfered with the following? General activity 2 Relation with others 2 Enjoyment of life 3 What TIME of day is your pain at its worst? daytime Sleep (in general) Good  Pain is worse with: sitting and standing Pain improves with: rest, heat/ice, therapy/exercise and injections Relief from Meds: 5  Mobility walk without assistance how many minutes can you walk? 10 ability to climb steps?  yes do you drive?  yes  Function Do you have any goals in this area?  no  Neuro/Psych dizziness depression anxiety  Prior Studies Any changes since last visit?  no  Physicians involved in your care Any changes since last visit?  no   History reviewed. No pertinent family history. History   Social History  . Marital Status: Married    Spouse Name: N/A    Number of Children: N/A  . Years of Education: N/A   Social History Main Topics  . Smoking status: Never Smoker   . Smokeless tobacco: None  . Alcohol Use: None  . Drug Use: None  . Sexually Active: None   Other Topics Concern  . None   Social History Narrative  . None   Past Surgical History  Procedure Date  . Foot surgery 07/2011   Past Medical History  Diagnosis Date  . Congenital musculoskeletal deformity of spine   . Spinal stenosis, lumbar region, without neurogenic claudication   . Lumbago   . Lumbosacral spondylosis without myelopathy    BP 150/93  Pulse 87  Resp 14  Ht 5\' 6"  (1.676 m)  Wt 202 lb 12.8 oz (91.989 kg)  BMI 32.73 kg/m2  SpO2 93%    Review of Systems  Musculoskeletal: Positive for back pain.  Neurological: Positive for dizziness.    Psychiatric/Behavioral: Positive for dysphoric mood. The patient is nervous/anxious.   All other systems reviewed and are negative.       Objective:   Physical Exam  Constitutional: She is oriented to person, place, and time. She appears well-developed and well-nourished.  HENT:  Head: Normocephalic and atraumatic.  Eyes: Conjunctivae normal and EOM are normal. Pupils are equal, round, and reactive to light.  Neck: Normal range of motion.  Musculoskeletal:       Back:  Neurological: She is alert and oriented to person, place, and time.  Psychiatric: She has a normal mood and affect.    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 Thoracic lumbar levoconvex scoliosis 2. Lumbar spondylosis improvement after radiofrequency procedure 3 months post Recommend trial of heel lift on the right side Return to clinic 3 months Weight loss Increase dietary fiber for constipation Increased fluid Discussed stationary bicycling, has a gym membership

## 2012-02-23 ENCOUNTER — Ambulatory Visit: Payer: Federal, State, Local not specified - PPO | Admitting: Physical Medicine & Rehabilitation

## 2012-07-19 DIAGNOSIS — Z79899 Other long term (current) drug therapy: Secondary | ICD-10-CM | POA: Diagnosis not present

## 2012-07-19 DIAGNOSIS — E7800 Pure hypercholesterolemia, unspecified: Secondary | ICD-10-CM | POA: Diagnosis not present

## 2012-07-22 DIAGNOSIS — E7800 Pure hypercholesterolemia, unspecified: Secondary | ICD-10-CM | POA: Diagnosis not present

## 2012-07-22 DIAGNOSIS — I10 Essential (primary) hypertension: Secondary | ICD-10-CM | POA: Diagnosis not present

## 2012-07-22 DIAGNOSIS — Z79899 Other long term (current) drug therapy: Secondary | ICD-10-CM | POA: Diagnosis not present

## 2012-07-22 DIAGNOSIS — E559 Vitamin D deficiency, unspecified: Secondary | ICD-10-CM | POA: Diagnosis not present

## 2012-07-22 DIAGNOSIS — N644 Mastodynia: Secondary | ICD-10-CM | POA: Diagnosis not present

## 2012-07-23 ENCOUNTER — Other Ambulatory Visit: Payer: Self-pay | Admitting: Family Medicine

## 2012-07-23 DIAGNOSIS — N644 Mastodynia: Secondary | ICD-10-CM

## 2012-08-01 ENCOUNTER — Other Ambulatory Visit: Payer: Self-pay | Admitting: Family Medicine

## 2012-08-01 ENCOUNTER — Ambulatory Visit
Admission: RE | Admit: 2012-08-01 | Discharge: 2012-08-01 | Disposition: A | Discharge status: Home / Self Care | Payer: Medicare Other | Source: Ambulatory Visit | Attending: Family Medicine | Admitting: Family Medicine

## 2012-08-01 ENCOUNTER — Ambulatory Visit
Admission: RE | Admit: 2012-08-01 | Discharge: 2012-08-01 | Disposition: A | Discharge status: Home / Self Care | Payer: Federal, State, Local not specified - PPO | Source: Ambulatory Visit | Attending: Family Medicine | Admitting: Family Medicine

## 2012-08-01 DIAGNOSIS — N644 Mastodynia: Secondary | ICD-10-CM

## 2012-11-01 ENCOUNTER — Encounter: Payer: Medicare Other | Attending: Physical Medicine & Rehabilitation

## 2012-11-01 ENCOUNTER — Encounter: Payer: Self-pay | Admitting: Physical Medicine & Rehabilitation

## 2012-11-01 ENCOUNTER — Ambulatory Visit (HOSPITAL_BASED_OUTPATIENT_CLINIC_OR_DEPARTMENT_OTHER): Payer: Medicare Other | Admitting: Physical Medicine & Rehabilitation

## 2012-11-01 VITALS — BP 134/87 | HR 76 | Resp 14 | Ht 66.0 in | Wt 203.0 lb

## 2012-11-01 DIAGNOSIS — M47817 Spondylosis without myelopathy or radiculopathy, lumbosacral region: Secondary | ICD-10-CM

## 2012-11-01 DIAGNOSIS — M419 Scoliosis, unspecified: Secondary | ICD-10-CM

## 2012-11-01 DIAGNOSIS — M4120 Other idiopathic scoliosis, site unspecified: Secondary | ICD-10-CM | POA: Diagnosis not present

## 2012-11-01 NOTE — Progress Notes (Signed)
  Subjective:    Patient ID: [name], female    DOB: 1945/08/05, 67 y.o.   MRN: [id number]  HPI Left sided RFA started wearing off 3-4 months ago Also has had right-sided medial branch block L3-L4 as well as L5 dorsal ramus in 2011 with greater than 50% relief on 2 occasions.  Starting to feel increasing right-sided symptoms.  Pain Inventory Average Pain 7 Pain Right Now 2 My pain is sharp and burning  In the last 24 hours, has pain interfered with the following? General activity 6 Relation with others 2 Enjoyment of life 6 What TIME of day is your pain at its worst? daytime and evening Sleep (in general) Fair  Pain is worse with: walking and standing Pain improves with: rest and injections Relief from Meds: 3  Mobility walk without assistance how many minutes can you walk? 5-10 ability to climb steps?  yes do you drive?  yes  Function retired  Neuro/Psych bladder control problems spasms dizziness depression  Prior Studies Any changes since last visit?  no  Physicians involved in your care Any changes since last visit?  no   History reviewed. No pertinent family history. History   Social History  . Marital Status: Married    Spouse Name: N/A    Number of Children: N/A  . Years of Education: N/A   Social History Main Topics  . Smoking status: Never Smoker   . Smokeless tobacco: Never Used  . Alcohol Use: None  . Drug Use: None  . Sexual Activity: None   Other Topics Concern  . None   Social History Narrative  . None   Past Surgical History  Procedure Laterality Date  . Foot surgery  07/2011   Past Medical History  Diagnosis Date  . Congenital musculoskeletal deformity of spine   . Spinal stenosis, lumbar region, without neurogenic claudication   . Lumbago   . Lumbosacral spondylosis without myelopathy    BP 134/87  Pulse 76  Resp 14  Ht 5\' 6"  (1.676 m)  Wt 203 lb (92.08 kg)  BMI 32.78 kg/m2  SpO2 94%     Review of Systems   Constitutional: Positive for diaphoresis.  Respiratory: Positive for apnea.   Musculoskeletal:       Spasms  Neurological: Positive for dizziness.  Hematological: Bruises/bleeds easily.  Psychiatric/Behavioral: Positive for dysphoric mood.  All other systems reviewed and are negative.       Objective:   Physical Exam  dextroconvex  Lumbar scoliosis Pain with extension, Limited extenion with normal lumbar flexion Palpation increases pain in the L4, L5 and S1 area left greater than right     Assessment & Plan:  1.  Lumbar spondylosis without myelopathy, radiofrequency procedure has worn off. Would recommend repeat L3-L4 medial branch and L5 dorsal ramus radiofrequency ablation. We will see how the left-sided ablation helps with right-sided pain. If the right-sided pain persists after the radiofrequency, would then perform radiofrequency on the right side

## 2012-11-12 ENCOUNTER — Encounter: Payer: Self-pay | Admitting: Physical Medicine & Rehabilitation

## 2012-11-12 ENCOUNTER — Encounter: Payer: Medicare Other | Attending: Physical Medicine & Rehabilitation

## 2012-11-12 ENCOUNTER — Ambulatory Visit (HOSPITAL_BASED_OUTPATIENT_CLINIC_OR_DEPARTMENT_OTHER): Payer: Medicare Other | Admitting: Physical Medicine & Rehabilitation

## 2012-11-12 VITALS — BP 132/86 | HR 86 | Resp 14 | Ht 66.0 in | Wt 203.8 lb

## 2012-11-12 DIAGNOSIS — M47817 Spondylosis without myelopathy or radiculopathy, lumbosacral region: Secondary | ICD-10-CM

## 2012-11-12 MED ORDER — GABAPENTIN 600 MG PO TABS: 600.0000 mg | ORAL_TABLET | Freq: Every day | ORAL | Status: DC

## 2012-11-12 NOTE — Patient Instructions (Signed)
Prescription for gabapentin 600 mg take 3 tablets tonight only Return to clinic one month

## 2012-11-12 NOTE — Progress Notes (Signed)
  PROCEDURE RECORD [facility]   Name: [name] DOB:1945-08-14 MRN: [id number]  Date:11/12/2012  Physician: Claudette Laws, MD    Nurse/CMA: Shumaker RN  Allergies: No Known Allergies  Consent Signed: yes  Is patient diabetic? no  CBG today?   Pregnant: no LMP: No LMP recorded. Patient is postmenopausal. (age 65-55)  Anticoagulants: no Anti-inflammatory: no Antibiotics: no  Procedure: left L3-4-5 Radiofrequency Neurotomy  Position: Prone Start Time: 11:36  End Time:11:55  Fluoro Time: 42 seconds  RN/CMA Designer, multimedia    Time 11:06 11:59    BP 132/86 134/82    Pulse 86 79    Respirations 14 14    O2 Sat 90 94    S/S 6 6    Pain Level 7/10 0/10     D/C home with her husband, patient A & O X 3, D/C instructions reviewed, and sits independently.

## 2012-11-12 NOTE — Progress Notes (Signed)
Left L5 dorsal ramus., left L4 and left L3 medial branch radio frequency neuropathy under fluoroscopic guidance  Indication: Low back pain due to lumbar spondylosis which has been relieved on 2 occasions by greater than 50% by lumbar medial branch blocks at corresponding levels. Previous procedure performed 08/29/2011 and resulted in 10 month duration of relief. Pain has now worsened to severe.  Informed consent was obtained after describing risks and benefits of the procedure with the patient, this includes bleeding, bruising, infection, paralysis and medication side effects. The patient wishes to proceed and has given written consent. The patient was placed in a prone position. The lumbar and sacral area was marked and prepped with Betadine. A 25-gauge 1-1/2 inch needle was inserted into the skin and subcutaneous tissue at 3 sites in one ML of 1% lidocaine was injected into each site. Then a 20-gauge 10 cm radio frequency needle with a 1 cm curved active tip was inserted targeting the left S1 SAP/sacral ala junction. Bone contact was made and confirmed with lateral imaging. Sensory stimulation at 50 Hz followed by motor stimulation at 2 Hz confirm proper needle location followed by injection of one ML of the solution containing one ML of 4 mg per mL dexamethasone and 3 mL of 1% MPF lidocaine. Then the left L5 SAP/transverse process junction was targeted. Bone contact was made and confirmed with lateral imaging. Sensory stimulation at 50 Hz followed by motor stimulation at 2 Hz confirm proper needle location followed by injection of one ML of the solution containing one ML of 4 mg per mL dexamethasone and 3 mL of 1% MPF lidocaine. Then the left L4 SAP/transverse process junction was targeted. Bone contact was made and confirmed with lateral imaging. Sensory stimulation at 50 Hz followed by motor stimulation at 2 Hz confirm proper needle location followed by injection of one ML of the solution containing one ML  of 4 mg per mL dexamethasone and 3 mL of 1% MPF lidocaine. Radio frequency lesion being at [facility] for 90 seconds was performed. Needles were removed. Post procedure instructions and vital signs were performed. Patient tolerated procedure well. Followup appointment was given.

## 2012-12-13 ENCOUNTER — Encounter: Payer: Self-pay | Admitting: Physical Medicine & Rehabilitation

## 2012-12-13 ENCOUNTER — Encounter: Payer: Medicare Other | Attending: Physical Medicine & Rehabilitation

## 2012-12-13 ENCOUNTER — Ambulatory Visit (HOSPITAL_BASED_OUTPATIENT_CLINIC_OR_DEPARTMENT_OTHER): Payer: Medicare Other | Admitting: Physical Medicine & Rehabilitation

## 2012-12-13 VITALS — BP 126/86 | HR 83 | Resp 14 | Ht 65.0 in | Wt 205.4 lb

## 2012-12-13 DIAGNOSIS — S73192A Other sprain of left hip, initial encounter: Secondary | ICD-10-CM

## 2012-12-13 DIAGNOSIS — M47817 Spondylosis without myelopathy or radiculopathy, lumbosacral region: Secondary | ICD-10-CM | POA: Diagnosis not present

## 2012-12-13 DIAGNOSIS — IMO0002 Reserved for concepts with insufficient information to code with codable children: Secondary | ICD-10-CM | POA: Diagnosis not present

## 2012-12-13 NOTE — Patient Instructions (Signed)
Please call if back pain worse again  Schedule PT visit at [facility]

## 2012-12-13 NOTE — Progress Notes (Signed)
   Subjective:    Patient ID: [name], female    DOB: [birth year], 67 y.o.   MRN: [id number]  HPI  L L3-4-5 RF helpful, Still has area over Left gluteus that is sore Uses heel lift on Right Pain Inventory Average Pain 7 Pain Right Now 0 My pain is na  In the last 24 hours, has pain interfered with the following? General activity 0 Relation with others 0 Enjoyment of life 0 What TIME of day is your pain at its worst? daytime Sleep (in general) Fair  Pain is worse with: walking and sitting Pain improves with: rest and medication Relief from Meds: na  Mobility walk without assistance how many minutes can you walk? 10 ability to climb steps?  yes do you drive?  yes transfers alone Do you have any goals in this area?  yes  Function retired  Neuro/Psych dizziness depression anxiety  Prior Studies Any changes since last visit?  no  Physicians involved in your care Any changes since last visit?  no   History reviewed. No pertinent family history. History   Social History  . Marital Status: Married    Spouse Name: N/A    Number of Children: N/A  . Years of Education: N/A   Social History Main Topics  . Smoking status: Never Smoker   . Smokeless tobacco: Never Used  . Alcohol Use: None  . Drug Use: None  . Sexual Activity: None   Other Topics Concern  . None   Social History Narrative  . None   Past Surgical History  Procedure Laterality Date  . Foot surgery  07/2011   Past Medical History  Diagnosis Date  . Congenital musculoskeletal deformity of spine   . Spinal stenosis, lumbar region, without neurogenic claudication   . Lumbago   . Lumbosacral spondylosis without myelopathy    BP 126/86  Pulse 83  Resp 14  Ht 5\' 5"  (1.651 m)  Wt 205 lb 6.4 oz (93.169 kg)  BMI 34.18 kg/m2  SpO2 92%      Review of Systems  Musculoskeletal: Positive for back pain.  Neurological: Positive for dizziness.  Psychiatric/Behavioral: Positive for  dysphoric mood. The patient is nervous/anxious.   All other systems reviewed and are negative.       Objective:   Physical Exam  Tenderness over the left gluteus medius. No tenderness over the left PSIS  Left iliac crest elevated compared to the right side. S shaped scoliosis convex right in the lumbosacral and convex left in the thoracic lumbar spine  Motor strength is normal bilateral lower extremities. Deep tendon reflexes are normal Ambulates without evidence of toe drag or knee instability     Assessment & Plan:  1. Lumbar spondylosis causing left lower facet pain. This is influenced by her scoliosis. Good relief from radiofrequency neurotomy. Previous procedure lasted for about 10 months. We can repeat as early as April of 2015 however this may last until next fall  2. Left gluteus pain this is likely secondary to a functional leg length discrepancy and over use during ambulation. Would recommend physical therapy to help with posture, stretching, strengthening. Patient prefers to do this at [facility]. She will call for an appointment

## 2013-01-09 HISTORY — PX: BACK SURGERY: SHX140

## 2013-01-09 HISTORY — PX: ANKLE SURGERY: SHX546

## 2013-03-14 DIAGNOSIS — M79609 Pain in unspecified limb: Secondary | ICD-10-CM | POA: Diagnosis not present

## 2013-03-14 DIAGNOSIS — L03039 Cellulitis of unspecified toe: Secondary | ICD-10-CM | POA: Diagnosis not present

## 2013-03-28 DIAGNOSIS — L03039 Cellulitis of unspecified toe: Secondary | ICD-10-CM | POA: Diagnosis not present

## 2013-03-28 DIAGNOSIS — M2040 Other hammer toe(s) (acquired), unspecified foot: Secondary | ICD-10-CM | POA: Diagnosis not present

## 2013-04-02 DIAGNOSIS — G4733 Obstructive sleep apnea (adult) (pediatric): Secondary | ICD-10-CM | POA: Diagnosis not present

## 2013-04-17 DIAGNOSIS — R5381 Other malaise: Secondary | ICD-10-CM | POA: Diagnosis not present

## 2013-04-17 DIAGNOSIS — I10 Essential (primary) hypertension: Secondary | ICD-10-CM | POA: Diagnosis not present

## 2013-04-25 DIAGNOSIS — R7301 Impaired fasting glucose: Secondary | ICD-10-CM | POA: Diagnosis not present

## 2013-05-15 DIAGNOSIS — F329 Major depressive disorder, single episode, unspecified: Secondary | ICD-10-CM | POA: Diagnosis not present

## 2013-05-15 DIAGNOSIS — I10 Essential (primary) hypertension: Secondary | ICD-10-CM | POA: Diagnosis not present

## 2013-05-15 DIAGNOSIS — F3289 Other specified depressive episodes: Secondary | ICD-10-CM | POA: Diagnosis not present

## 2013-05-20 DIAGNOSIS — F4321 Adjustment disorder with depressed mood: Secondary | ICD-10-CM | POA: Diagnosis not present

## 2013-06-04 DIAGNOSIS — F4321 Adjustment disorder with depressed mood: Secondary | ICD-10-CM | POA: Diagnosis not present

## 2013-06-17 DIAGNOSIS — F4321 Adjustment disorder with depressed mood: Secondary | ICD-10-CM | POA: Diagnosis not present

## 2013-07-02 DIAGNOSIS — F4321 Adjustment disorder with depressed mood: Secondary | ICD-10-CM | POA: Diagnosis not present

## 2013-07-08 DIAGNOSIS — F4321 Adjustment disorder with depressed mood: Secondary | ICD-10-CM | POA: Diagnosis not present

## 2013-07-17 DIAGNOSIS — F4321 Adjustment disorder with depressed mood: Secondary | ICD-10-CM | POA: Diagnosis not present

## 2013-08-01 ENCOUNTER — Other Ambulatory Visit: Payer: Self-pay

## 2013-08-01 DIAGNOSIS — Z1231 Encounter for screening mammogram for malignant neoplasm of breast: Secondary | ICD-10-CM

## 2013-08-01 DIAGNOSIS — Z9882 Breast implant status: Secondary | ICD-10-CM

## 2013-08-11 ENCOUNTER — Ambulatory Visit
Admission: RE | Admit: 2013-08-11 | Discharge: 2013-08-11 | Disposition: A | Discharge status: Home / Self Care | Payer: Medicare Other | Source: Ambulatory Visit

## 2013-08-11 ENCOUNTER — Encounter (INDEPENDENT_AMBULATORY_CARE_PROVIDER_SITE_OTHER): Payer: Self-pay

## 2013-08-11 DIAGNOSIS — Z1231 Encounter for screening mammogram for malignant neoplasm of breast: Secondary | ICD-10-CM | POA: Diagnosis not present

## 2013-08-11 DIAGNOSIS — Z9882 Breast implant status: Secondary | ICD-10-CM

## 2013-08-13 ENCOUNTER — Other Ambulatory Visit: Payer: Self-pay | Admitting: Family Medicine

## 2013-08-13 DIAGNOSIS — R928 Other abnormal and inconclusive findings on diagnostic imaging of breast: Secondary | ICD-10-CM

## 2013-08-19 ENCOUNTER — Ambulatory Visit
Admission: RE | Admit: 2013-08-19 | Discharge: 2013-08-19 | Disposition: A | Discharge status: Home / Self Care | Payer: Medicare Other | Source: Ambulatory Visit | Attending: Family Medicine | Admitting: Family Medicine

## 2013-08-19 DIAGNOSIS — F4321 Adjustment disorder with depressed mood: Secondary | ICD-10-CM | POA: Diagnosis not present

## 2013-08-19 DIAGNOSIS — N6459 Other signs and symptoms in breast: Secondary | ICD-10-CM | POA: Diagnosis not present

## 2013-08-19 DIAGNOSIS — R928 Other abnormal and inconclusive findings on diagnostic imaging of breast: Secondary | ICD-10-CM

## 2013-08-19 DIAGNOSIS — N6489 Other specified disorders of breast: Secondary | ICD-10-CM | POA: Diagnosis not present

## 2013-08-27 DIAGNOSIS — F4321 Adjustment disorder with depressed mood: Secondary | ICD-10-CM | POA: Diagnosis not present

## 2013-09-09 DIAGNOSIS — R159 Full incontinence of feces: Secondary | ICD-10-CM

## 2013-09-09 HISTORY — DX: Full incontinence of feces: R15.9

## 2013-09-17 DIAGNOSIS — M545 Low back pain, unspecified: Secondary | ICD-10-CM | POA: Diagnosis not present

## 2013-09-17 DIAGNOSIS — R159 Full incontinence of feces: Secondary | ICD-10-CM | POA: Diagnosis not present

## 2013-09-17 DIAGNOSIS — Z23 Encounter for immunization: Secondary | ICD-10-CM | POA: Diagnosis not present

## 2013-09-18 ENCOUNTER — Other Ambulatory Visit: Payer: Self-pay | Admitting: Family Medicine

## 2013-09-18 DIAGNOSIS — M545 Low back pain: Secondary | ICD-10-CM

## 2013-09-23 ENCOUNTER — Ambulatory Visit
Admission: RE | Admit: 2013-09-23 | Discharge: 2013-09-23 | Disposition: A | Discharge status: Home / Self Care | Payer: Medicare Other | Source: Ambulatory Visit | Attending: Family Medicine | Admitting: Family Medicine

## 2013-09-23 ENCOUNTER — Other Ambulatory Visit: Payer: Self-pay | Admitting: Family Medicine

## 2013-09-23 DIAGNOSIS — M545 Low back pain: Secondary | ICD-10-CM

## 2013-09-23 DIAGNOSIS — M48061 Spinal stenosis, lumbar region without neurogenic claudication: Secondary | ICD-10-CM | POA: Diagnosis not present

## 2013-09-23 DIAGNOSIS — M5137 Other intervertebral disc degeneration, lumbosacral region: Secondary | ICD-10-CM | POA: Diagnosis not present

## 2013-09-23 DIAGNOSIS — M47817 Spondylosis without myelopathy or radiculopathy, lumbosacral region: Secondary | ICD-10-CM | POA: Diagnosis not present

## 2013-09-23 DIAGNOSIS — M4120 Other idiopathic scoliosis, site unspecified: Secondary | ICD-10-CM | POA: Diagnosis not present

## 2013-09-26 ENCOUNTER — Other Ambulatory Visit: Payer: Medicare Other

## 2013-10-06 ENCOUNTER — Ambulatory Visit (HOSPITAL_BASED_OUTPATIENT_CLINIC_OR_DEPARTMENT_OTHER): Payer: Medicare Other | Admitting: Physical Medicine & Rehabilitation

## 2013-10-06 ENCOUNTER — Encounter: Payer: Medicare Other | Attending: Physical Medicine & Rehabilitation

## 2013-10-06 ENCOUNTER — Encounter: Payer: Self-pay | Admitting: Physical Medicine & Rehabilitation

## 2013-10-06 VITALS — BP 148/92 | HR 74 | Resp 14 | Ht 66.0 in | Wt 204.4 lb

## 2013-10-06 DIAGNOSIS — M5430 Sciatica, unspecified side: Secondary | ICD-10-CM | POA: Diagnosis not present

## 2013-10-06 DIAGNOSIS — M4120 Other idiopathic scoliosis, site unspecified: Secondary | ICD-10-CM | POA: Diagnosis not present

## 2013-10-06 DIAGNOSIS — M48062 Spinal stenosis, lumbar region with neurogenic claudication: Secondary | ICD-10-CM | POA: Insufficient documentation

## 2013-10-06 DIAGNOSIS — M47817 Spondylosis without myelopathy or radiculopathy, lumbosacral region: Secondary | ICD-10-CM | POA: Diagnosis not present

## 2013-10-06 DIAGNOSIS — M419 Scoliosis, unspecified: Secondary | ICD-10-CM

## 2013-10-06 DIAGNOSIS — M5431 Sciatica, right side: Secondary | ICD-10-CM

## 2013-10-06 MED ORDER — GABAPENTIN 300 MG PO CAPS: 300.0000 mg | ORAL_CAPSULE | Freq: Two times a day (BID) | ORAL | Status: DC

## 2013-10-06 NOTE — Patient Instructions (Addendum)
Will first do epidural injection  Then we will do radiofrequency repeat  Due knee  hug stretches as discussed 1 minute on the right, 1 minute on the left and then 1 minute with both knees

## 2013-10-06 NOTE — Progress Notes (Signed)
Subjective:    Patient ID: [name], female    DOB: 12/13/1945, 68 y.o.   MRN: [id number]  HPI  L L3-4-5 RF performed on 11/12/2012  Starting to wear off  Her chief complaint is 2 month history of right sided sciatic discomfort. Pain is worse with standing and walking. Better with sitting and lying down. Needs frequent change of positioning. No trauma, no bowel or bladder dysfunction no fevers no other new medical problems, has had weight gain rather than weight loss, nocturnal pain not a big issue  Takes aleve as needed Hasn't tried prednisone   Primary care physician ordered MRI of the lumbar spine listed below Pain Inventory Average Pain 7 Pain Right Now 7 My pain is constant, sharp, burning and stabbing  In the last 24 hours, has pain interfered with the following? General activity 7 Relation with others 7 Enjoyment of life 8 What TIME of day is your pain at its worst? morning Sleep (in general) Fair  Pain is worse with: walking, sitting and standing Pain improves with: injections Relief from Meds: 4  Mobility walk without assistance  Function retired  Neuro/Psych bowel control problems  Prior Studies Any changes since last visit?  no MRI LUMBAR SPINE WITHOUT CONTRAST  TECHNIQUE:  Multiplanar, multisequence MR imaging of the lumbar spine was  performed. No intravenous contrast was administered.  COMPARISON: None.  FINDINGS:  Moderate dextroscoliosis at L1-2. Negative for fracture or mass. No  bone marrow edema. Conus medullaris is normal and terminates at L1.  T12-L1: Disc degeneration and spurring on the left with mild left  foraminal narrowing  L1-2: Disc degeneration and spurring left greater than right.  Bilateral facet hypertrophy with mild spinal stenosis.  L2-3: Disc degeneration with disc bulging and mild spurring.  Moderate facet hypertrophy. Mild spinal stenosis. Neural foramina  are adequately patent  L3-4: 7 mm anterior slip.  Advanced facet hypertrophy. Moderate  spinal stenosis.  L4-5: 7 mm anterior slip with advanced facet hypertrophy. Moderate  spinal stenosis. Marked right foraminal encroachment due to bony  overgrowth.  L5-S1: Mild facet degeneration without stenosis  IMPRESSION:  Moderate dextroscoliosis at L1-2. No acute fracture or mass  Multilevel disc and facet degeneration throughout the lumbar spine.  Mild spinal stenosis L1-2 and L2-3.  Grade 1 slip and moderate stenosis at L3-4 and L4-5.  Physicians involved in your care Any changes since last visit?  no   Family History  Problem Relation Age of Onset  . Dementia Mother   . Stroke Mother   . Depression Mother   . COPD Father   . Cancer Brother    History   Social History  . Marital Status: Married    Spouse Name: N/A    Number of Children: N/A  . Years of Education: N/A   Social History Main Topics  . Smoking status: Never Smoker   . Smokeless tobacco: Never Used  . Alcohol Use: None  . Drug Use: None  . Sexual Activity: None   Other Topics Concern  . None   Social History Narrative  . None   Past Surgical History  Procedure Laterality Date  . Foot surgery  07/2011   Past Medical History  Diagnosis Date  . Congenital musculoskeletal deformity of spine   . Spinal stenosis, lumbar region, without neurogenic claudication   . Lumbago   . Lumbosacral spondylosis without myelopathy   . Incontinence of bowel 09/2013   BP 148/92  Pulse 74  Resp 14  Ht 5\' 6"  (1.676 m)  Wt 204 lb 6.4 oz (92.715 kg)  BMI 33.01 kg/m2  SpO2 96%  Opioid Risk Score:   Fall Risk Score: Low Fall Risk (0-5 points)   Review of Systems     Objective:   Physical Exam  Vitals reviewed. Constitutional: She is oriented to person, place, and time. She appears well-developed and well-nourished.  HENT:  Head: Normocephalic and atraumatic.  Eyes: Conjunctivae and EOM are normal. Pupils are equal, round, and reactive to light.  Neck: Normal  range of motion.  Musculoskeletal:  Ltd. range of motion lumbar spine flexion 75% extension 0-25% lateral bending 0-25%  Leg length discrepancy right iliac crest lower than left  Evidence of scoliosis  Neurological: She is alert and oriented to person, place, and time. She has normal strength.  Reflex Scores:      Patellar reflexes are 2+ on the right side and 2+ on the left side.      Achilles reflexes are 1+ on the right side and 1+ on the left side. Reduced sensation to pinprick right L3 L4-L5  Psychiatric: She has a normal mood and affect.          Assessment & Plan:  1. Lumbar spondylosis causing left lower facet pain. This is influenced by her scoliosis. Good relief from radiofrequency neurotomy. Patient is 11 months post procedure and it is starting to wear off. Will need to repeat in 1-2 months  2. right sciatic pain. Appears to be in the L4-L5 dermatomes primarily. Reviewed MRI ordered by primary care physician Has evidence of L4-L5 foraminal stenosis. Has been on anti-inflammatory, does not want to try oral steroids for fear of weight gain. Discussed other treatment options, to inflamed for physical therapy at this point. Will schedule for transforaminal injection L4-L5  Will add gabapentin 300 mg twice a day, patient will continue 1800 mg each bedtime

## 2013-10-20 DIAGNOSIS — Z8371 Family history of colonic polyps: Secondary | ICD-10-CM | POA: Diagnosis not present

## 2013-10-20 DIAGNOSIS — R159 Full incontinence of feces: Secondary | ICD-10-CM | POA: Diagnosis not present

## 2013-10-20 DIAGNOSIS — K5900 Constipation, unspecified: Secondary | ICD-10-CM | POA: Diagnosis not present

## 2013-10-30 ENCOUNTER — Ambulatory Visit: Payer: Medicare Other | Admitting: Physical Medicine & Rehabilitation

## 2013-11-17 DIAGNOSIS — E780 Pure hypercholesterolemia: Secondary | ICD-10-CM | POA: Diagnosis not present

## 2013-11-17 DIAGNOSIS — I10 Essential (primary) hypertension: Secondary | ICD-10-CM | POA: Diagnosis not present

## 2013-11-17 DIAGNOSIS — E559 Vitamin D deficiency, unspecified: Secondary | ICD-10-CM | POA: Diagnosis not present

## 2013-11-17 DIAGNOSIS — E669 Obesity, unspecified: Secondary | ICD-10-CM | POA: Diagnosis not present

## 2013-11-17 DIAGNOSIS — G4733 Obstructive sleep apnea (adult) (pediatric): Secondary | ICD-10-CM | POA: Diagnosis not present

## 2013-11-17 DIAGNOSIS — Z0000 Encounter for general adult medical examination without abnormal findings: Secondary | ICD-10-CM | POA: Diagnosis not present

## 2013-11-28 ENCOUNTER — Encounter: Payer: Self-pay | Admitting: Physical Medicine & Rehabilitation

## 2013-11-28 ENCOUNTER — Encounter: Payer: Medicare Other | Attending: Physical Medicine & Rehabilitation

## 2013-11-28 ENCOUNTER — Ambulatory Visit (HOSPITAL_BASED_OUTPATIENT_CLINIC_OR_DEPARTMENT_OTHER): Payer: Medicare Other | Admitting: Physical Medicine & Rehabilitation

## 2013-11-28 VITALS — BP 114/63 | HR 71 | Resp 14

## 2013-11-28 DIAGNOSIS — M47817 Spondylosis without myelopathy or radiculopathy, lumbosacral region: Secondary | ICD-10-CM | POA: Diagnosis not present

## 2013-11-28 DIAGNOSIS — M419 Scoliosis, unspecified: Secondary | ICD-10-CM

## 2013-11-28 DIAGNOSIS — M4120 Other idiopathic scoliosis, site unspecified: Secondary | ICD-10-CM | POA: Insufficient documentation

## 2013-11-28 MED ORDER — TRAMADOL HCL 50 MG PO TABS: 50.0000 mg | ORAL_TABLET | Freq: Two times a day (BID) | ORAL | Status: DC | PRN

## 2013-11-28 NOTE — Patient Instructions (Signed)
Next visit is for RF

## 2013-11-28 NOTE — Progress Notes (Signed)
   Subjective:    Patient ID: [name], female    DOB: 1945/07/23, 68 y.o.   MRN: [id number]  HPI Right-sided sciatic pain has improved with Aleve as well as rest.  Left-sided low back pain is increasing since last visit in September 2015  Left L3 L4 L5 lumbar radiofrequency performed about one year ago. Pain Inventory Average Pain 5 Pain Right Now 8 My pain is constant, sharp and burning  In the last 24 hours, has pain interfered with the following? General activity 6 Relation with others 5 Enjoyment of life 6 What TIME of day is your pain at its worst? morning and daytime Sleep (in general) Fair  Pain is worse with: sitting and standing Pain improves with: injections Relief from Meds: 2  Mobility walk without assistance how many minutes can you walk? 5 ability to climb steps?  yes do you drive?  yes  Function retired  Neuro/Psych No problems in this area  Prior Studies Any changes since last visit?  no  Physicians involved in your care Any changes since last visit?  no   Family History  Problem Relation Age of Onset  . Dementia Mother   . Stroke Mother   . Depression Mother   . COPD Father   . Cancer Brother    History   Social History  . Marital Status: Married    Spouse Name: N/A    Number of Children: N/A  . Years of Education: N/A   Social History Main Topics  . Smoking status: Never Smoker   . Smokeless tobacco: Never Used  . Alcohol Use: None  . Drug Use: None  . Sexual Activity: None   Other Topics Concern  . None   Social History Narrative   Past Surgical History  Procedure Laterality Date  . Foot surgery  07/2011   Past Medical History  Diagnosis Date  . Congenital musculoskeletal deformity of spine   . Spinal stenosis, lumbar region, without neurogenic claudication   . Lumbago   . Lumbosacral spondylosis without myelopathy   . Incontinence of bowel 09/2013   BP 114/63 mmHg  Pulse 71  Resp 14  SpO2  93%  Opioid Risk Score:   Fall Risk Score: Low Fall Risk (0-5 points)  Review of Systems  Psychiatric/Behavioral: Positive for confusion.  All other systems reviewed and are negative.      Objective:   Physical Exam  Emission left L4 and left L5 sensation to pinprick  Reduced lumbar range of motion with extension and lateral bending. Forward flexion is good  No tenderness palpation lumbar paraspinals  Lower extremity strength is good  Decreased hip internal/external rotation bilateral  Straight leg raising is negative       Assessment & Plan:  1. Lumbar spondylosis greater than one year post left L3-L4 medial branch radiofrequency and L5 dorsal ramus radiofrequency. We'll schedule for repeat procedure  Also having some pain across the back. Reviewed MRI findings bilateral facet arthropathy advanced at L3-4 and L4-5. This is likely accounting for the pain that goes left to right  Pain increasing despite Aleve. We will start some tramadol until RF can be performed  Now off venlafaxine which should minimize any chance of serotonin syndrome

## 2013-12-05 DIAGNOSIS — M25521 Pain in right elbow: Secondary | ICD-10-CM | POA: Diagnosis not present

## 2013-12-11 DIAGNOSIS — M25521 Pain in right elbow: Secondary | ICD-10-CM | POA: Diagnosis not present

## 2013-12-18 ENCOUNTER — Telehealth: Payer: Self-pay | Admitting: *Deleted

## 2013-12-18 ENCOUNTER — Ambulatory Visit: Payer: Medicare Other | Admitting: Physical Medicine & Rehabilitation

## 2013-12-18 NOTE — Telephone Encounter (Signed)
Pt called, got an infection around Thanksgiving, was prescribed antibiotics and had two more tablets to take. She asked if she would be able to have her procedure still. I consulted with Sybil, she said it that the patient will have to reschedule. Pt was taken off the schedule and rescheduled for 01/20/2014

## 2013-12-25 DIAGNOSIS — M8580 Other specified disorders of bone density and structure, unspecified site: Secondary | ICD-10-CM | POA: Diagnosis not present

## 2013-12-25 DIAGNOSIS — F4321 Adjustment disorder with depressed mood: Secondary | ICD-10-CM | POA: Diagnosis not present

## 2013-12-25 DIAGNOSIS — E780 Pure hypercholesterolemia: Secondary | ICD-10-CM | POA: Diagnosis not present

## 2013-12-25 DIAGNOSIS — M899 Disorder of bone, unspecified: Secondary | ICD-10-CM | POA: Diagnosis not present

## 2013-12-25 DIAGNOSIS — Z780 Asymptomatic menopausal state: Secondary | ICD-10-CM | POA: Diagnosis not present

## 2013-12-31 DIAGNOSIS — R5383 Other fatigue: Secondary | ICD-10-CM | POA: Diagnosis not present

## 2013-12-31 DIAGNOSIS — R197 Diarrhea, unspecified: Secondary | ICD-10-CM | POA: Diagnosis not present

## 2014-01-01 DIAGNOSIS — R197 Diarrhea, unspecified: Secondary | ICD-10-CM | POA: Diagnosis not present

## 2014-01-20 ENCOUNTER — Encounter: Payer: Medicare Other | Attending: Physical Medicine & Rehabilitation

## 2014-01-20 ENCOUNTER — Ambulatory Visit (HOSPITAL_BASED_OUTPATIENT_CLINIC_OR_DEPARTMENT_OTHER): Payer: Medicare Other | Admitting: Physical Medicine & Rehabilitation

## 2014-01-20 ENCOUNTER — Encounter: Payer: Self-pay | Admitting: Physical Medicine & Rehabilitation

## 2014-01-20 VITALS — BP 115/82 | HR 63 | Resp 14

## 2014-01-20 DIAGNOSIS — M47817 Spondylosis without myelopathy or radiculopathy, lumbosacral region: Secondary | ICD-10-CM

## 2014-01-20 DIAGNOSIS — M4120 Other idiopathic scoliosis, site unspecified: Secondary | ICD-10-CM | POA: Insufficient documentation

## 2014-01-20 NOTE — Patient Instructions (Signed)

## 2014-01-20 NOTE — Progress Notes (Signed)
Left L5 dorsal ramus., left L4 and left L3 medial branch radio frequency neurotomy under fluoroscopic guidance  Indication: Low back pain due to lumbar spondylosis which has been relieved on 2 occasions by greater than 50% by lumbar medial branch blocks at corresponding levels. Previous procedure performed 08/29/2011 and resulted in 10 month duration of relief. Pain has now worsened to severe.  Informed consent was obtained after describing risks and benefits of the procedure with the patient, this includes bleeding, bruising, infection, paralysis and medication side effects. The patient wishes to proceed and has given written consent. The patient was placed in a prone position. The lumbar and sacral area was marked and prepped with Betadine. A 25-gauge 1-1/2 inch needle was inserted into the skin and subcutaneous tissue at 3 sites in one ML of 1% lidocaine was injected into each site. Then a 20-gauge 10 cm radio frequency needle with a 1 cm curved active tip was inserted targeting the left S1 SAP/sacral ala junction. Bone contact was made and confirmed with lateral imaging. Sensory stimulation at 50 Hz followed by motor stimulation at 2 Hz confirm proper needle location followed by injection of one ML of the solution containing one ML of 4 mg per mL dexamethasone and 3 mL of 1% MPF lidocaine. Then the left L5 SAP/transverse process junction was targeted. Bone contact was made and confirmed with lateral imaging. Sensory stimulation at 50 Hz followed by motor stimulation at 2 Hz confirm proper needle location followed by injection of one ML of the solution containing one ML of 4 mg per mL dexamethasone and 3 mL of 1% MPF lidocaine. Then the left L4 SAP/transverse process junction was targeted. Bone contact was made and confirmed with lateral imaging. Sensory stimulation at 50 Hz followed by motor stimulation at 2 Hz confirm proper needle location followed by injection of one ML of the solution containing one ML  of 4 mg per mL dexamethasone and 3 mL of 1% MPF lidocaine. Radio frequency lesion being at [facility] for 90 seconds was performed. Needles were removed. Post procedure instructions and vital signs were performed. Patient tolerated procedure well. Followup appointment was given.

## 2014-01-20 NOTE — Progress Notes (Signed)
  PROCEDURE RECORD [facility]   Name: [name] DOB:[birth year] MRN: [id number]  Date:01/20/2014  Physician: Alysia Penna, MD    Nurse/CMA: MaryBeth Ginkel  Allergies: No Known Allergies  Consent Signed: Yes.    Is patient diabetic? No.  CBG today?  Pregnant: No. LMP: No LMP recorded. Patient is postmenopausal. (age 67-55)  Anticoagulants: no Anti-inflammatory: no Antibiotics: no    Procedure: RF L3, 4 &5 Left side Position: Prone Start Time:  3:40 End Time: 3:56 Fluoro Time: 74  RN/CMA Ken Ikeem Cleckler MaryBeth Ginkel    Time 3:00 PM 4:10    BP 115/82 144/86    Pulse 63 58    Respirations 14 14    O2 Sat 99 98    S/S 6 6    Pain Level 4/10 1/10     D/C home with husband, patient A & O X 3, D/C instructions reviewed, and sits independently.

## 2014-02-11 DIAGNOSIS — K5900 Constipation, unspecified: Secondary | ICD-10-CM | POA: Diagnosis not present

## 2014-02-11 DIAGNOSIS — E780 Pure hypercholesterolemia: Secondary | ICD-10-CM | POA: Diagnosis not present

## 2014-02-11 DIAGNOSIS — F410 Panic disorder [episodic paroxysmal anxiety] without agoraphobia: Secondary | ICD-10-CM | POA: Diagnosis not present

## 2014-03-25 DIAGNOSIS — F4321 Adjustment disorder with depressed mood: Secondary | ICD-10-CM | POA: Diagnosis not present

## 2014-04-06 DIAGNOSIS — G4733 Obstructive sleep apnea (adult) (pediatric): Secondary | ICD-10-CM | POA: Diagnosis not present

## 2014-05-20 DIAGNOSIS — E559 Vitamin D deficiency, unspecified: Secondary | ICD-10-CM | POA: Diagnosis not present

## 2014-05-20 DIAGNOSIS — E669 Obesity, unspecified: Secondary | ICD-10-CM | POA: Diagnosis not present

## 2014-05-20 DIAGNOSIS — I10 Essential (primary) hypertension: Secondary | ICD-10-CM | POA: Diagnosis not present

## 2014-05-20 DIAGNOSIS — E780 Pure hypercholesterolemia: Secondary | ICD-10-CM | POA: Diagnosis not present

## 2014-05-26 DIAGNOSIS — F4321 Adjustment disorder with depressed mood: Secondary | ICD-10-CM | POA: Diagnosis not present

## 2014-07-10 ENCOUNTER — Other Ambulatory Visit: Payer: Self-pay

## 2014-07-10 DIAGNOSIS — Z1231 Encounter for screening mammogram for malignant neoplasm of breast: Secondary | ICD-10-CM

## 2014-07-20 DIAGNOSIS — F331 Major depressive disorder, recurrent, moderate: Secondary | ICD-10-CM | POA: Diagnosis not present

## 2014-07-20 DIAGNOSIS — F411 Generalized anxiety disorder: Secondary | ICD-10-CM | POA: Diagnosis not present

## 2014-08-11 DIAGNOSIS — F331 Major depressive disorder, recurrent, moderate: Secondary | ICD-10-CM | POA: Diagnosis not present

## 2014-08-11 DIAGNOSIS — F411 Generalized anxiety disorder: Secondary | ICD-10-CM | POA: Diagnosis not present

## 2014-08-17 ENCOUNTER — Ambulatory Visit
Admission: RE | Admit: 2014-08-17 | Discharge: 2014-08-17 | Disposition: A | Discharge status: Home / Self Care | Payer: Medicare Other | Source: Ambulatory Visit

## 2014-08-17 DIAGNOSIS — Z1231 Encounter for screening mammogram for malignant neoplasm of breast: Secondary | ICD-10-CM

## 2014-08-26 DIAGNOSIS — F331 Major depressive disorder, recurrent, moderate: Secondary | ICD-10-CM | POA: Diagnosis not present

## 2014-08-26 DIAGNOSIS — F411 Generalized anxiety disorder: Secondary | ICD-10-CM | POA: Diagnosis not present

## 2014-09-09 DIAGNOSIS — F331 Major depressive disorder, recurrent, moderate: Secondary | ICD-10-CM | POA: Diagnosis not present

## 2014-09-09 DIAGNOSIS — F411 Generalized anxiety disorder: Secondary | ICD-10-CM | POA: Diagnosis not present

## 2014-09-23 DIAGNOSIS — F4321 Adjustment disorder with depressed mood: Secondary | ICD-10-CM | POA: Diagnosis not present

## 2014-09-24 ENCOUNTER — Encounter: Payer: Self-pay | Admitting: Physical Medicine & Rehabilitation

## 2014-09-24 ENCOUNTER — Encounter: Payer: Medicare Other | Attending: Physical Medicine & Rehabilitation

## 2014-09-24 ENCOUNTER — Ambulatory Visit (HOSPITAL_BASED_OUTPATIENT_CLINIC_OR_DEPARTMENT_OTHER): Payer: Medicare Other | Admitting: Physical Medicine & Rehabilitation

## 2014-09-24 VITALS — BP 107/81 | HR 63 | Resp 16

## 2014-09-24 DIAGNOSIS — M47817 Spondylosis without myelopathy or radiculopathy, lumbosacral region: Secondary | ICD-10-CM | POA: Insufficient documentation

## 2014-09-24 NOTE — Progress Notes (Signed)
Subjective:    Patient ID: [name], female    DOB: 1945-08-31, 69 y.o.   MRN: [id number]  HPI 01/20/2014- Left L5 dorsal ramus., left L4 and left L3 medial branch radio frequency neurotomy under fluoroscopic guidance  Excellent relief since that time up until about 2 weeks ago. She had some sharp stabbing pain a couple days ago. She is very physically active doing a lot of yard work.  Pain is relieved by sitting. Increases with standing and walking No pain radiating into the lower extremity. Her pain is left sided low back. Pain Inventory Average Pain 9 Pain Right Now 5 My pain is sharp and burning  In the last 24 hours, has pain interfered with the following? General activity 9 Relation with others 9 Enjoyment of life 9 What TIME of day is your pain at its worst? morning Sleep (in general) Fair  Pain is worse with: walking, bending and sitting Pain improves with: rest Relief from Meds: 0  Mobility walk without assistance how many minutes can you walk? 5 ability to climb steps?  no do you drive?  yes Do you have any goals in this area?  yes  Function retired Do you have any goals in this area?  yes  Neuro/Psych dizziness depression anxiety  Prior Studies Any changes since last visit?  no  Physicians involved in your care Any changes since last visit?  no   Family History  Problem Relation Age of Onset  . Dementia Mother   . Stroke Mother   . Depression Mother   . COPD Father   . Cancer Brother    Social History   Social History  . Marital Status: Married    Spouse Name: N/A  . Number of Children: N/A  . Years of Education: N/A   Social History Main Topics  . Smoking status: Never Smoker   . Smokeless tobacco: Never Used  . Alcohol Use: None  . Drug Use: None  . Sexual Activity: Not Asked   Other Topics Concern  . None   Social History Narrative   Past Surgical History  Procedure Laterality Date  . Foot surgery  07/2011    Past Medical History  Diagnosis Date  . Congenital musculoskeletal deformity of spine   . Spinal stenosis, lumbar region, without neurogenic claudication   . Lumbago   . Lumbosacral spondylosis without myelopathy   . Incontinence of bowel 09/2013   BP 107/81 mmHg  Pulse 63  Resp 16  SpO2 97%  Opioid Risk Score:   Fall Risk Score:  `1  Depression screen PHQ 2/9  No flowsheet data found.   Review of Systems  Neurological: Positive for dizziness and light-headedness.  Psychiatric/Behavioral: Positive for dysphoric mood. The patient is nervous/anxious.   All other systems reviewed and are negative.      Objective:   Physical Exam  Constitutional: She is oriented to person, place, and time. She appears well-developed and well-nourished.  HENT:  Head: Normocephalic and atraumatic.  Eyes: Conjunctivae and EOM are normal. Pupils are equal, round, and reactive to light.  Neck: Normal range of motion.  Neurological: She is alert and oriented to person, place, and time.  Psychiatric: She has a normal mood and affect.  Nursing note and vitals reviewed.  Pelvic obliquity left iliac crest higher than right.  Hip range of motion is normal. She has back pain with hip range of motion. Negative straight leg raising Lower extremity strength 5/5 bilaterally Sensation intact to  light touch bilateral lower extremities       Assessment & Plan:  1.Lumbar spondylosis Left Lower lumbar area. She has responded well on 2 occasions to L3 L4 L5 lumbar radiofrequency ablation on the left side. She had 10 months of relief with the first RF and approximately 8 months with the second, would recommend repeat. She may require physical therapy after the repeat radiofrequency.  Discussed with patient agrees a plan

## 2014-09-29 DIAGNOSIS — F411 Generalized anxiety disorder: Secondary | ICD-10-CM | POA: Diagnosis not present

## 2014-09-29 DIAGNOSIS — F331 Major depressive disorder, recurrent, moderate: Secondary | ICD-10-CM | POA: Diagnosis not present

## 2014-10-26 ENCOUNTER — Encounter: Payer: Self-pay | Admitting: Physical Medicine & Rehabilitation

## 2014-10-26 ENCOUNTER — Ambulatory Visit (HOSPITAL_BASED_OUTPATIENT_CLINIC_OR_DEPARTMENT_OTHER): Payer: Medicare Other | Admitting: Physical Medicine & Rehabilitation

## 2014-10-26 ENCOUNTER — Encounter: Payer: Medicare Other | Attending: Physical Medicine & Rehabilitation

## 2014-10-26 VITALS — BP 117/77 | HR 67 | Resp 14

## 2014-10-26 DIAGNOSIS — M47817 Spondylosis without myelopathy or radiculopathy, lumbosacral region: Secondary | ICD-10-CM | POA: Diagnosis not present

## 2014-10-26 MED ORDER — PREGABALIN 150 MG PO CAPS: 150.0000 mg | ORAL_CAPSULE | Freq: Two times a day (BID) | ORAL | Status: DC

## 2014-10-26 NOTE — Patient Instructions (Addendum)
You had a radio frequency procedure today This was done to alleviate joint pain in your lumbar area We injected a combination of dexamethasone which is a steroid as well as lidocaine which is a local anesthetic. Dexamethasone made increased blood sugars you are diabetic You may experience soreness at the injection sites. You may also experienced some irritation of the nerves that were heated I'm recommending ice for 30 minutes every 2 hours as needed for the next 24-48 hours In addition he will be taking lyrica 150 times a day today only if this is not one of your usual medicines

## 2014-10-26 NOTE — Progress Notes (Signed)
Left L5 dorsal ramus., left L4 and left L3 medial branch radio frequency neuropathy under fluoroscopic guidance  Indication: Low back pain due to lumbar spondylosis which has been relieved on 2 occasions by greater than 50% by lumbar medial branch blocks at corresponding levels.  Informed consent was obtained after describing risks and benefits of the procedure with the patient, this includes bleeding, bruising, infection, paralysis and medication side effects. The patient wishes to proceed and has given written consent. The patient was placed in a prone position. The lumbar and sacral area was marked and prepped with Betadine. A 25-gauge 1-1/2 inch needle was inserted into the skin and subcutaneous tissue at 3 sites in one ML of 1% lidocaine was injected into each site. Then a 20-gauge 10 cm radio frequency needle with a 1 cm curved active tip was inserted targeting the left S1 SAP/sacral ala junction. Bone contact was made and confirmed with lateral imaging. Sensory stimulation at 50 Hz followed by motor stimulation at 2 Hz confirm proper needle location followed by injection of one ML of the solution containing one ML of 4 mg per mL dexamethasone and 3 mL of 1% MPF lidocaine. Then the left L5 SAP/transverse process junction was targeted. Bone contact was made and confirmed with lateral imaging. Sensory stimulation at 50 Hz followed by motor stimulation at 2 Hz confirm proper needle location followed by injection of one ML of the solution containing one ML of 4 mg per mL dexamethasone and 3 mL of 1% MPF lidocaine. Then the left L4 SAP/transverse process junction was targeted. Bone contact was made and confirmed with lateral imaging. Sensory stimulation at 50 Hz followed by motor stimulation at 2 Hz confirm proper needle location followed by injection of one ML of the solution containing one ML of 4 mg per mL dexamethasone and 3 mL of 1% MPF lidocaine. Radio frequency lesion being at 80C for 90 seconds was  performed. Needles were removed. Post procedure instructions and vital signs were performed. Patient tolerated procedure well. Followup appointment was given.  

## 2014-10-26 NOTE — Progress Notes (Signed)
  [facility]   Name: MADYSYN HANKEN DOB:May 29, 1945 MRN: [id number]  Date:10/26/2014  Physician: Alysia Penna, MD    Nurse/CMA: Mancel Parsons  Allergies: No Known Allergies  Consent Signed: Yes.    Is patient diabetic? No.  CBG today?   Pregnant: No. LMP: No LMP recorded. Patient is postmenopausal. (age 67-55)  Anticoagulants: no Anti-inflammatory: yes (aleve taken last night) Antibiotics: no  Procedure: left l3,4,5  Radiofrequency neurotomy Position: Prone Start Time: 1:15 pm  End Time:  1:54pm      Fluoro Time:128  RN/CMA Rolan Bucco Lauraine Crespo    Time 12:48 pm 1:58 pm    BP 117/77 142/84    Pulse 67 104    Respirations 14 14    O2 Sat 94 95    S/S 6 6    Pain Level 7/10 0/10     D/C home with husband, patient A & O X 3, D/C instructions reviewed, and sits independently.

## 2014-11-03 DIAGNOSIS — F411 Generalized anxiety disorder: Secondary | ICD-10-CM | POA: Diagnosis not present

## 2014-11-03 DIAGNOSIS — F331 Major depressive disorder, recurrent, moderate: Secondary | ICD-10-CM | POA: Diagnosis not present

## 2014-11-10 DIAGNOSIS — F4321 Adjustment disorder with depressed mood: Secondary | ICD-10-CM | POA: Diagnosis not present

## 2014-11-23 DIAGNOSIS — I10 Essential (primary) hypertension: Secondary | ICD-10-CM | POA: Diagnosis not present

## 2014-11-23 DIAGNOSIS — E7800 Pure hypercholesterolemia, unspecified: Secondary | ICD-10-CM | POA: Diagnosis not present

## 2014-11-23 DIAGNOSIS — E669 Obesity, unspecified: Secondary | ICD-10-CM | POA: Diagnosis not present

## 2014-11-23 DIAGNOSIS — E559 Vitamin D deficiency, unspecified: Secondary | ICD-10-CM | POA: Diagnosis not present

## 2014-11-23 DIAGNOSIS — M4800 Spinal stenosis, site unspecified: Secondary | ICD-10-CM | POA: Diagnosis not present

## 2014-11-23 DIAGNOSIS — Z23 Encounter for immunization: Secondary | ICD-10-CM | POA: Diagnosis not present

## 2014-11-23 DIAGNOSIS — R42 Dizziness and giddiness: Secondary | ICD-10-CM | POA: Diagnosis not present

## 2014-11-24 ENCOUNTER — Telehealth: Payer: Self-pay

## 2014-11-24 NOTE — Telephone Encounter (Signed)
Patient called complaining of increased pain on her left side.  She recently had a procedure and the pain in coming back.  Patient says standing and putting pressure on her left side causes her to hardly stand.  She has tried aleve with no relief.  She does take gabapentin 1800mg  qhs.  Patient would like to know what else to do?  She does have another procedure scheduled, but would like to know what to do in the mean time.  Please advise.

## 2014-11-24 NOTE — Telephone Encounter (Signed)
You can try Ibuprofen 600mg  three times a day with food until the next appt

## 2014-11-24 NOTE — Telephone Encounter (Signed)
Called pt, passed on  Dr. Letta Pate recommendation, pt understood

## 2014-11-30 DIAGNOSIS — F331 Major depressive disorder, recurrent, moderate: Secondary | ICD-10-CM | POA: Diagnosis not present

## 2014-11-30 DIAGNOSIS — F411 Generalized anxiety disorder: Secondary | ICD-10-CM | POA: Diagnosis not present

## 2014-12-01 ENCOUNTER — Encounter: Payer: Medicare Other | Attending: Physical Medicine & Rehabilitation

## 2014-12-01 ENCOUNTER — Encounter: Payer: Self-pay | Admitting: Physical Medicine & Rehabilitation

## 2014-12-01 ENCOUNTER — Ambulatory Visit (HOSPITAL_BASED_OUTPATIENT_CLINIC_OR_DEPARTMENT_OTHER): Payer: Medicare Other | Admitting: Physical Medicine & Rehabilitation

## 2014-12-01 VITALS — BP 115/81 | HR 95

## 2014-12-01 DIAGNOSIS — M533 Sacrococcygeal disorders, not elsewhere classified: Secondary | ICD-10-CM

## 2014-12-01 DIAGNOSIS — M47817 Spondylosis without myelopathy or radiculopathy, lumbosacral region: Secondary | ICD-10-CM | POA: Insufficient documentation

## 2014-12-01 MED ORDER — MELOXICAM 15 MG PO TABS: 15.0000 mg | ORAL_TABLET | Freq: Every day | ORAL | Status: DC

## 2014-12-01 NOTE — Progress Notes (Signed)
Bilateral sacroiliac injections under fluoroscopic guidance  Indication: Low back and buttocks pain not relieved by medication management and other conservative care.Patient states that the last left L3 L4 L5 RF was not helpful. Her pain is in the buttocks area bilaterally, Centered at the PSIS area.  Informed consent was obtained after describing risks and benefits of the procedure with the patient, this includes bleeding, bruising, infection, paralysis and medication side effects. The patient wishes to proceed and has given written consent. The patient was placed in a prone position. The lumbar and sacral area was marked and prepped with Betadine. A 25-gauge 1-1/2 inch needle was inserted into the skin and subcutaneous tissue and 1 mL of 1% lidocaine was injected into each side. Then a 25-gauge 3 inch spinal needle was inserted under fluoroscopic guidance into the left sacroiliac joint. AP and lateral images were utilized. Omnipaque 180x0.5 mL under live fluoroscopy demonstrated no intravascular uptake. Then a solution containing one ML of 6 mg per mL Celestone in 2 ML of 2% lidocaine MPF was injected x1.5 mL. This same procedure was repeated on the right side using the same needle, injectate, and technique. Patient tolerated the procedure well. Post procedure instructions were given. Please see post procedure form.

## 2014-12-01 NOTE — Progress Notes (Signed)
  [facility]   Name: [name] DOB:[birth year] MRN: [id number]  Date:12/01/2014  Physician: Alysia Penna, MD    Nurse/CMA: Mancel Parsons  Allergies: No Known Allergies  Consent Signed: Yes.    Is patient diabetic? No.  CBG today? NA  Pregnant: No. LMP: No LMP recorded. Patient is postmenopausal. (age 67-55)  Anticoagulants: no Anti-inflammatory: yes (Naproxen-last dose 12/01/14 this morning) Antibiotics: no  Procedure: Bilateral sacroiliac steroid injection Position: Prone Start Time: 10:07 End Time: 10:15 Fluoro Time: 25  RN/CMA [name]	[name]    Time 9:30 10:20    BP 115/81 142/74    Pulse 95 61    Respirations 14 14    O2 Sat 95 96    S/S 6 6    Pain Level 3 2     D/C home with husband, patient A & O X 3, D/C instructions reviewed, and sits independently.

## 2014-12-01 NOTE — Patient Instructions (Addendum)
Stop naproxen Start Mobic  Sacroiliac injection was performed today. A combination of a naming medicine plus a cortisone medicine was injected. The injection was done under x-ray guidance. This procedure has been performed to help reduce low back and buttocks pain as well as potentially hip pain. The duration of this injection is variable lasting from hours to  Months. It may repeated if needed.

## 2014-12-11 DIAGNOSIS — F4321 Adjustment disorder with depressed mood: Secondary | ICD-10-CM | POA: Diagnosis not present

## 2014-12-22 ENCOUNTER — Ambulatory Visit (HOSPITAL_BASED_OUTPATIENT_CLINIC_OR_DEPARTMENT_OTHER): Payer: Medicare Other | Admitting: Physical Medicine & Rehabilitation

## 2014-12-22 ENCOUNTER — Encounter: Payer: Self-pay | Admitting: Rehabilitation

## 2014-12-22 ENCOUNTER — Ambulatory Visit: Payer: Medicare Other | Attending: Physical Medicine & Rehabilitation | Admitting: Rehabilitation

## 2014-12-22 ENCOUNTER — Encounter: Payer: Self-pay | Admitting: Physical Medicine & Rehabilitation

## 2014-12-22 ENCOUNTER — Encounter: Payer: Medicare Other | Attending: Physical Medicine & Rehabilitation

## 2014-12-22 VITALS — BP 127/92 | HR 64 | Resp 14

## 2014-12-22 DIAGNOSIS — M545 Low back pain, unspecified: Secondary | ICD-10-CM

## 2014-12-22 DIAGNOSIS — R262 Difficulty in walking, not elsewhere classified: Secondary | ICD-10-CM | POA: Diagnosis not present

## 2014-12-22 DIAGNOSIS — M533 Sacrococcygeal disorders, not elsewhere classified: Secondary | ICD-10-CM | POA: Insufficient documentation

## 2014-12-22 DIAGNOSIS — M47817 Spondylosis without myelopathy or radiculopathy, lumbosacral region: Secondary | ICD-10-CM | POA: Diagnosis not present

## 2014-12-22 DIAGNOSIS — M4120 Other idiopathic scoliosis, site unspecified: Secondary | ICD-10-CM

## 2014-12-22 DIAGNOSIS — G8929 Other chronic pain: Secondary | ICD-10-CM | POA: Diagnosis not present

## 2014-12-22 DIAGNOSIS — M419 Scoliosis, unspecified: Secondary | ICD-10-CM

## 2014-12-22 MED ORDER — TRAMADOL HCL 50 MG PO TABS: 50.0000 mg | ORAL_TABLET | Freq: Three times a day (TID) | ORAL | Status: DC

## 2014-12-22 NOTE — Therapy (Signed)
[facility] [address] [place], [place], [address] Phone: [phone]   Fax:  [phone]  Physical Therapy Evaluation  Patient Details  Name: [name] MRN: [id number] Date of Birth: April 20, 1945 Referring Provider: Alysia Penna, MD  Encounter Date: 12/22/2014      PT End of Session - 12/22/14 1705    Visit Number 1   Number of Visits 16   Date for PT Re-Evaluation 02/16/15   PT Start Time [id number]   PT Stop Time 1619   PT Time Calculation (min) 45 min   Activity Tolerance Patient tolerated treatment well      Past Medical History  Diagnosis Date  . Congenital musculoskeletal deformity of spine   . Spinal stenosis, lumbar region, without neurogenic claudication   . Lumbago   . Lumbosacral spondylosis without myelopathy   . Incontinence of bowel 09/2013    Past Surgical History  Procedure Laterality Date  . Foot surgery  07/2011    There were no vitals filed for this visit.  Visit Diagnosis:  Idiopathic scoliosis - Plan: PT plan of care cert/re-cert  Difficulty walking - Plan: PT plan of care cert/re-cert  Chronic bilateral low back pain without sciatica - Plan: PT plan of care cert/re-cert      Subjective Assessment - 12/22/14 1537    Subjective Pt presents with L3, 4, 5 severe OA and DDD.  Significant history of treatment for this from nerve blocks to radiofrequency and will be starting accupuncture.  Was even going to have a laminotomy but decided against it.   Reports L sided pain is worse than the R, but is present both sides.  some leg pain but reports most problems midline in the back/sacral area.  Difficulty with walking   Pertinent History sacral injection recently that helped, severe scoliosis   Limitations Sitting   How long can you sit comfortably? 2 hours   How long can you stand comfortably? 52min   How long can you walk comfortably? 34min; feels better flexed   Diagnostic  tests MRI showing moderate lumbar stenosis, and global OA/facet degeneration   Patient Stated Goals improve tolerance to all daily activities, decrease pain with walking, decrease pain with sewing.     Currently in Pain? Yes   Pain Score 0-No pain  currently; up to 8-9/10    Pain Location Back   Pain Orientation Right;Left   Pain Descriptors / Indicators Constant;Sharp   Pain Type Chronic pain   Pain Onset More than a month ago   Aggravating Factors  walking, sitting, standing   Pain Relieving Factors aleve, lying supine,             [facility] PT Assessment - 12/22/14 0001    Assessment   Medical Diagnosis lumbar pain DDD   Referring Provider Alysia Penna, MD   Onset Date/Surgical Date 12/09/04   Next MD Visit january 13th for a branch block   Prior Therapy yes   Precautions   Precautions None   Restrictions   Weight Bearing Restrictions No   Balance Screen   Has the patient fallen in the past 6 months No   Has the patient had a decrease in activity level because of a fear of falling?  No   Is the patient reluctant to leave their home because of a fear of falling?  No   Home Ecologist residence   Prior Function   Level of Independence Independent  Vocation Retired   Leisure sewing   Observation/Other Assessments   Focus on Therapeutic Outcomes (FOTO)  60% limited   Posture/Postural Control   Posture/Postural Control Postural limitations   Postural Limitations Rounded Shoulders;Flexed trunk;Decreased lumbar lordosis;Weight shift left  L hip signifcantly higher due to scoliosis   Posture Comments due to pain and scoliosis    ROM / Strength   AROM / PROM / Strength AROM;Strength   AROM   AROM Assessment Site Lumbar   Lumbar Flexion full "feels good" R rib hump   Lumbar Extension decreased 50%   Lumbar - Right Side Bend full with pull   Lumbar - Left Side Bend full with pull   Lumbar - Right Rotation decreased by 50%   Lumbar - Left  Rotation decreased by 50%   Strength   Overall Strength Comments not tested due to time restraints today   Flexibility   Soft Tissue Assessment /Muscle Length yes   Hamstrings to 80bil   Quadriceps sig tightness R, moderate L   Piriformis sig tightness bil   Palpation   Palpation comment tenderness +1 R lumbar paraspinals L2-5 and with CPA pressure of L5   Special Tests    Special Tests Lumbar;Leg LengthTest;Hip Special Tests   Leg length test  True;Apparent   Hip Special Tests  Thomas Test;SI Compression   True   Comments ).5in difference in supine   Apparent   Comments 2in difference in standing apparent   Motorola Positive   Side Right;Left   Comments not painful but tight   SI Compression   Findings Positive   Side Right   Comments in sidelying   Ambulation/Gait   Gait Pattern Trunk flexed;Lateral trunk lean to right                   [facility] Adult PT Treatment/Exercise - 12/22/14 0001    Exercises   Exercises Lumbar   Manual Therapy   Manual Therapy Taping   Mulligan L sidelying SIJ compression leukotaping from R ASIS held in a posterior glide as a SIJ belt trial                PT Education - 12/22/14 1705    Education provided Yes   Education Details HEP, POC, taping   Person(s) Educated Patient   Methods Explanation;Demonstration;Tactile cues;Handout   Comprehension Verbalized understanding;Returned demonstration             PT Long Term Goals - 12/22/14 1716    PT LONG TERM GOAL #1   Title pt will be independent with HEP for continued core strengthening   Time 8   Period Weeks   Status New   PT LONG TERM GOAL #2   Title pt will demonstrate 4/5 or greater MMT of the LEs   Time 8   Period Weeks   Status New   PT LONG TERM GOAL #3   Title pt will report increased walking tolerance to >25minutes   Time 8   Period Weeks   Status New   PT LONG TERM GOAL #4   Title pt will decrease FOTO to 45% or less   Time 8    Period Weeks   Status New               Plan - 12/22/14 1707    Clinical Impression Statement Pt presents with chronic low back pain due to progressive scoliosis, lumbar stenosis, and facet and segmental Degeneration and  OA.  Pt reports decreased pain and improved status with prior pilates, nerve ablation, and use of Aleve.  Current deficits include decreased lumbar ROM globally, except flexion which is pain relieving, Signifcant flexibility restrictions in the quadriceps, hip flexors, and hip rotators/adductors.  MD requested a trial of an SIJ belt.  Performed SIJ taping in L sidelying today with R innominate posterior glide held.  Strength not tested today due to time but deficits will be addressed as well as a core/pilates focus.     Pt will benefit from skilled therapeutic intervention in order to improve on the following deficits Abnormal gait;Decreased mobility;Pain;Hypomobility;Difficulty walking;Postural dysfunction   Rehab Potential Good   PT Frequency 2x / week   PT Duration 8 weeks   PT Treatment/Interventions ADLs/Self Care Home Management;Electrical Stimulation;Moist Heat;Therapeutic exercise;Neuromuscular re-education;Patient/family education;Manual techniques;Passive range of motion;Taping   PT Next Visit Plan assess strength, begin flexibility and core strengthening, repeat taping? other taping if needed,         Future considerations: heel lift?, STM and modalities as needed   Consulted and Agree with Plan of Care Patient          G-Codes - 01/06/15 1718    Functional Assessment Tool Used FOTO 60% limitation   Functional Limitation Mobility: Walking and moving around   Mobility: Walking and Moving Around Current Status [phone]) At least 60 percent but less than 80 percent impaired, limited or restricted   Mobility: Walking and Moving Around Goal Status [id number]) At least 40 percent but less than 60 percent impaired, limited or restricted       Problem List Patient  Active Problem List   Diagnosis Date Noted  . Disorder of sacroiliac joint 2015/01/06  . Chronic low back pain 01-06-2015  . Spinal stenosis, lumbar region, with neurogenic claudication 10/06/2013  . Acquired scoliosis 11/27/2011  . Lumbosacral spondylosis without myelopathy 05/22/2011    Stark Bray, DPT, CMP 06-Jan-2015, 5:21 PM  [facility] [address] [place], [place], [address] Phone: [phone]   Fax:  [phone]  Name: [name] MRN: [id number] Date of Birth: 1945-11-29

## 2014-12-22 NOTE — Patient Instructions (Signed)
HEP2go handout with bilateral SKTC and KTOS stretches 3x20" each

## 2014-12-22 NOTE — Patient Instructions (Signed)
Will order physical therapy  Start tramadol  Medial branch blocks for left-sided low back pain  May repeat sacroiliac injection in the future  Acupuncture may be helpful will have office check your insurance benefits for this.

## 2014-12-22 NOTE — Progress Notes (Signed)
Subjective:    Patient ID: [name], female    DOB: February 27, 1945, 69 y.o.   MRN: [id number]  HPI Patient returns today, she is rather upset about her decreasing level of activity. She relates this to low back pain that interferes with standing and walking. She did not get of very good relief with her last left L3 L4 L5 Medial branch radiofrequency neurotomy, In the past she's had at least a several month relief with medial branch blocks. Has not trialed these for greater than one year.  She did get good relief of her buttock pain with the sacroiliac injections but these only lasted a couple weeks. We discussed that this does help establish diagnosis of sacroiliac dysfunction which is part of her chronic low back pain syndrome  We also discussed that she has some pain radiating into the thighs. This could be either from the sacroiliac joint or from the lumbar facet joints. In addition her lumbar spinal stenosis may be progressing and causing some neurogenic claudication. She has no leg weakness.  Pain with left lateral decubitus position this pain is in the back not in the hip  Denies any numbness or tingling in the feet  Pain Inventory Average Pain 8 Pain Right Now 2 My pain is sharp and burning  In the last 24 hours, has pain interfered with the following? General activity 9 Relation with others 8 Enjoyment of life 9 What TIME of day is your pain at its worst? NA Sleep (in general) NA  Pain is worse with: walking, standing and some activites Pain improves with: rest and injections Relief from Meds: 5  Mobility walk without assistance how many minutes can you walk? 5 ability to climb steps?  yes do you drive?  yes  Function retired I need assistance with the following:  household duties and shopping  Neuro/Psych trouble walking  Prior Studies Any changes since last visit?  no  Physicians involved in your care Any changes since last visit?  no   Family  History  Problem Relation Age of Onset  . Dementia Mother   . Stroke Mother   . Depression Mother   . COPD Father   . Cancer Brother    Social History   Social History  . Marital Status: Married    Spouse Name: N/A  . Number of Children: N/A  . Years of Education: N/A   Social History Main Topics  . Smoking status: Never Smoker   . Smokeless tobacco: Never Used  . Alcohol Use: None  . Drug Use: None  . Sexual Activity: Not Asked   Other Topics Concern  . None   Social History Narrative   Past Surgical History  Procedure Laterality Date  . Foot surgery  07/2011   Past Medical History  Diagnosis Date  . Congenital musculoskeletal deformity of spine   . Spinal stenosis, lumbar region, without neurogenic claudication   . Lumbago   . Lumbosacral spondylosis without myelopathy   . Incontinence of bowel 09/2013   BP 127/92 mmHg  Pulse 64  Resp 14  SpO2 93%  Opioid Risk Score:   Fall Risk Score:  `1  Depression screen PHQ 2/9  No flowsheet data found.    Review of Systems  Respiratory: Positive for apnea.   Gastrointestinal: Positive for constipation.  Musculoskeletal: Positive for gait problem.  All other systems reviewed and are negative.      Objective:   Physical Exam  Constitutional: She is oriented to  person, place, and time. She appears well-developed and well-nourished.  HENT:  Head: Normocephalic and atraumatic.  Eyes: Conjunctivae and EOM are normal. Pupils are equal, round, and reactive to light.  Neck: Normal range of motion.  Musculoskeletal:       Lumbar back: She exhibits decreased range of motion and deformity. She exhibits no swelling.       Back:  Upper lumbar dextroscoliosis  Neurological: She is alert and oriented to person, place, and time.  Psychiatric: She has a normal mood and affect.  Nursing note and vitals reviewed. Lumbar range of motion is 75% with flexion 0-25% extension 0-25% lateral bending.  Neuro: Gait shows no  evidence of total right or knee instability  Motor strength is 5/5 in bilateral deltoid, biceps, triceps, finger flexors and extensors, wrist flexors and extensors, hip flexors, knee flexors and extensors, ankle dorsiflexors, plantar flexors, invertors and evertors, toe flexors and extensors  Sensory exam is normal to pinprick, proprioception and light touch in the  lower limbs           Assessment & Plan:  1. Chronic low back pain and multifactorial including A. Lumbar spondylosis which has in the past responded well to medial branch blocks and medial branch radiofrequency ablation. The last ablation was not particularly helpful. Would repeat medial branch blocks to see if this is the same pain generator.  B. Sacroiliac dysfunction: Has had good short-term relief with sacroiliac injections. Recommend physical therapy for stabilization exercises as well as evaluate for sacroiliac belt  C.  Lumbar scoliosis this is likely giving her some postural pain due to altered biomechanics Including her side-lying pain  Will check insurance benefits for acupuncture as this may be helpful for her overall pain relief.  Trial of tramadol 50 mg 3 times a day

## 2014-12-25 ENCOUNTER — Ambulatory Visit: Payer: Medicare Other | Admitting: Physical Medicine & Rehabilitation

## 2014-12-28 DIAGNOSIS — F331 Major depressive disorder, recurrent, moderate: Secondary | ICD-10-CM | POA: Diagnosis not present

## 2014-12-28 DIAGNOSIS — F411 Generalized anxiety disorder: Secondary | ICD-10-CM | POA: Diagnosis not present

## 2014-12-29 ENCOUNTER — Encounter: Payer: Self-pay | Admitting: Physical Therapy

## 2014-12-29 ENCOUNTER — Ambulatory Visit: Payer: Medicare Other | Admitting: Physical Therapy

## 2014-12-29 DIAGNOSIS — M4120 Other idiopathic scoliosis, site unspecified: Secondary | ICD-10-CM

## 2014-12-29 DIAGNOSIS — M545 Low back pain, unspecified: Secondary | ICD-10-CM

## 2014-12-29 DIAGNOSIS — R262 Difficulty in walking, not elsewhere classified: Secondary | ICD-10-CM | POA: Diagnosis not present

## 2014-12-29 DIAGNOSIS — G8929 Other chronic pain: Secondary | ICD-10-CM | POA: Diagnosis not present

## 2014-12-29 NOTE — Patient Instructions (Signed)
Cervico-Thoracic: Extension / Rotation (Sitting)    Reach across body with left arm and grasp back of chair. Gently look over right side shoulder. Hold __20__ seconds. Relax. Repeat to other side Repeat __3__ times alternating to each side, .per set.  Do 2-3 sessions per day.  [email or web address]   C

## 2014-12-29 NOTE — Therapy (Signed)
[facility] [address], [place], [place], [address] Phone: [phone]   Fax:  [phone]  Physical Therapy Treatment  Patient Details  Name: [name] MRN: [id number] Date of Birth: July 19, 1945 Referring Provider: Alysia Penna, MD  Encounter Date: 12/29/2014      PT End of Session - 12/29/14 1033    Visit Number 2   Number of Visits 16   Date for PT Re-Evaluation 02/16/15   PT Start Time 0928   PT Stop Time 1029   PT Time Calculation (min) 61 min   Activity Tolerance Patient tolerated treatment well   Behavior During Therapy [facility] for tasks assessed/performed      Past Medical History  Diagnosis Date  . Congenital musculoskeletal deformity of spine   . Spinal stenosis, lumbar region, without neurogenic claudication   . Lumbago   . Lumbosacral spondylosis without myelopathy   . Incontinence of bowel 09/2013    Past Surgical History  Procedure Laterality Date  . Foot surgery  07/2011    There were no vitals filed for this visit.  Visit Diagnosis:  Idiopathic scoliosis  Difficulty walking  Chronic bilateral low back pain without sciatica      Subjective Assessment - 12/29/14 1001    Pertinent History sacral injection recently that helped, severe scoliosis,  pt with L3,4,5 severe OA and DDD. History of nerve blocks to radiofrequency and will be starting accupunture. Lt sided pain is worse than Rt.    Limitations Sitting   How long can you sit comfortably? 2 hours   How long can you stand comfortably? 42min   How long can you walk comfortably? 67min; feels better flexed   Diagnostic tests MRI showing moderate lumbar stenosis, and global OA/facet degeneration   Patient Stated Goals improve tolerance to all daily activities, decrease pain with walking, decrease pain with sewing.     Currently in Pain? Yes   Pain Score 1   as the day progressed pain goes up to 7-8/10   Pain Location Back    Pain Orientation Right;Left  Rt > Lt   Pain Descriptors / Indicators Constant;Sharp   Pain Type Chronic pain   Pain Onset More than a month ago   Aggravating Factors  walking, sitting, standing   Pain Relieving Factors Aleve, supine   Multiple Pain Sites No                         [facility] Adult PT Treatment/Exercise - 12/29/14 0001    Bed Mobility   Bed Mobility --  Educated pt on   Posture/Postural Control   Posture/Postural Control --  Educated pt on use of noodle / towelroll to assist sitting     Exercises   Exercises Lumbar   Lumbar Exercises: Stretches   Single Knee to Chest Stretch 3 reps;20 seconds   Double Knee to Chest Stretch 3 reps;20 seconds   Lumbar Exercises: Seated   Other Seated Lumbar Exercises Pulleys into flexion for thoracic extension, with noodle in back for support      Other Seated Lumbar Exercises thoracic selfmob in sitting x 3 with 20 sec hold, with demo and verbal cues    Lumbar Exercises: Supine   Other Supine Lumbar Exercises Pelvic tilts ant/posterior for muscle relaxation lumbar   Other Supine Lumbar Exercises abdominal activation with pelvic floor contraction x 10 with 5 sec hold   Modalities   Modalities Moist Heat   Moist  Heat Therapy   Number Minutes Moist Heat 15 Minutes   Moist Heat Location Lumbar Spine  in hooklying                PT Education - 12/29/14 1032    Education provided Yes   Education Details Cervico / thoracic extension,// verbally discussed and practiced useof tools as noodle with sitting posture   Person(s) Educated Patient   Methods Explanation;Demonstration;Tactile cues;Handout   Comprehension Verbalized understanding;Returned demonstration             PT Long Term Goals - 12/29/14 1311    PT LONG TERM GOAL #1   Title pt will be independent with HEP for continued core strengthening   Time 8   Period Weeks   Status On-going   PT LONG TERM GOAL #2   Title pt will demonstrate 4/5  or greater MMT of the LEs   Time 8   Period Weeks   Status On-going   PT LONG TERM GOAL #3   Title pt will report increased walking tolerance to >54minutes   Time 8   Period Weeks   Status On-going   PT LONG TERM GOAL #4   Title pt will decrease FOTO to 45% or less   Time 8   Period Weeks   Status On-going               Plan - 12/29/14 1037    Clinical Impression Statement Pt tearful today due to long chronic low back pain and feeling of hopeless situation. Pt was able to perform gentle stretching exercises. Pt will continue to benefit from skilled PT to adress pain, core activation and overall endurance.   Pt will benefit from skilled therapeutic intervention in order to improve on the following deficits Abnormal gait;Decreased mobility;Pain;Hypomobility;Difficulty walking;Postural dysfunction   Rehab Potential Good   Clinical Impairments Affecting Rehab Potential chronic low back pain due to progressive scoliosis, umbar stenosis, and facet and segmental    PT Frequency 2x / week   PT Duration 8 weeks   PT Treatment/Interventions ADLs/Self Care Home Management;Electrical Stimulation;Moist Heat;Therapeutic exercise;Neuromuscular re-education;Patient/family education;Manual techniques;Passive range of motion;Taping   PT Next Visit Plan Continue with flexibility, core activation,  Future considerations: heel lift?, STM and modalities as needed   Consulted and Agree with Plan of Care Patient        Problem List Patient Active Problem List   Diagnosis Date Noted  . Disorder of sacroiliac joint 12/22/2014  . Chronic low back pain 12/22/2014  . Spinal stenosis, lumbar region, with neurogenic claudication 10/06/2013  . Acquired scoliosis 11/27/2011  . Lumbosacral spondylosis without myelopathy 05/22/2011    NAUMANN-HOUEGNIFIO,[name] PTA 12/29/2014, 1:15 PM  [facility] [address] [place], [place],  [address] Phone: [phone]   Fax:  [phone]  Name: [name] MRN: [id number] Date of Birth: [birth year]

## 2014-12-31 ENCOUNTER — Encounter: Payer: Self-pay | Admitting: Physical Therapy

## 2014-12-31 ENCOUNTER — Ambulatory Visit: Payer: Medicare Other | Admitting: Physical Therapy

## 2014-12-31 DIAGNOSIS — R262 Difficulty in walking, not elsewhere classified: Secondary | ICD-10-CM

## 2014-12-31 DIAGNOSIS — G8929 Other chronic pain: Secondary | ICD-10-CM

## 2014-12-31 DIAGNOSIS — M4120 Other idiopathic scoliosis, site unspecified: Secondary | ICD-10-CM

## 2014-12-31 DIAGNOSIS — M545 Low back pain, unspecified: Secondary | ICD-10-CM

## 2014-12-31 NOTE — Therapy (Signed)
[facility] [address] [place], [place], [address] Phone: [phone]   Fax:  [phone]  Physical Therapy Treatment  Patient Details  Name: [name] MRN: [id number] Date of Birth: 10-Sep-1945 Referring Provider: Alysia Penna, MD  Encounter Date: 12/31/2014      PT End of Session - 12/31/14 1005    Visit Number 3   Number of Visits 16   Date for PT Re-Evaluation 02/16/15   PT Start Time 0929   PT Stop Time 1030   PT Time Calculation (min) 61 min   Activity Tolerance Patient tolerated treatment well   Behavior During Therapy [facility] for tasks assessed/performed      Past Medical History  Diagnosis Date  . Congenital musculoskeletal deformity of spine   . Spinal stenosis, lumbar region, without neurogenic claudication   . Lumbago   . Lumbosacral spondylosis without myelopathy   . Incontinence of bowel 09/2013    Past Surgical History  Procedure Laterality Date  . Foot surgery  07/2011    There were no vitals filed for this visit.  Visit Diagnosis:  Idiopathic scoliosis  Difficulty walking  Chronic bilateral low back pain without sciatica      Subjective Assessment - 12/31/14 0937    Subjective Pt reports pain as 1/10 toady.    Pertinent History sacral injection recently that helped, severe scoliosis,  pt with L3,4,5 severe OA and DDD. History of nerve blocks to radiofrequency and will be starting accupunture. Lt sided pain is worse than Rt.    Limitations Sitting   How long can you sit comfortably? 2 hours   How long can you stand comfortably? 68min   How long can you walk comfortably? 58min; feels better flexed   Diagnostic tests MRI showing moderate lumbar stenosis, and global OA/facet degeneration   Patient Stated Goals improve tolerance to all daily activities, decrease pain with walking, decrease pain with sewing.     Currently in Pain? Yes   Pain Score 1    Pain Location Back    Pain Orientation Left;Right  Left sacroiliac area   Pain Descriptors / Indicators Constant;Sharp   Pain Type Chronic pain   Pain Onset More than a month ago   Pain Frequency Intermittent   Aggravating Factors  walking, sitting, standing   Pain Relieving Factors Aleve, supine   Multiple Pain Sites No                         [facility] Adult PT Treatment/Exercise - 12/31/14 0001    Posture/Postural Control   Posture/Postural Control Postural limitations  re-inforced use of towel roll or noodle at home to improve s   Postural Limitations Rounded Shoulders;Flexed trunk;Decreased lumbar lordosis;Weight shift left   Posture Comments due to pain and scoliosis    Exercises   Exercises Lumbar   Lumbar Exercises: Stretches   Single Knee to Chest Stretch Other (comment);3 reps;20 seconds  diagonal for piriformis stretch   Double Knee to Chest Stretch --   Piriformis Stretch 3 reps;20 seconds  in sitting, in supine; & pigeonpose x 1 each side   Lumbar Exercises: Seated   Long Arc Quad on Chair Other (comment)  lat bending to right x 10 with 3 sec hold sitting in chair   Sit to Stand 20 reps  2 x 10 from mat table, 5#added in UE focus on abdominal    Other Seated Lumbar Exercises Pulleys into flexion for thoracic  extension, with noodle in back for support      Other Seated Lumbar Exercises thoracic selfmob in sitting x 3 with 20 sec hold, with demo and verbal cues    Moist Heat Therapy   Number Minutes Moist Heat 15 Minutes   Moist Heat Location Lumbar Spine                PT Education - 12/31/14 1004    Education provided Yes   Education Details Piriformis stretch in sitting, supine, diagonal, and pigeonpose   Person(s) Educated Patient   Methods Explanation;Demonstration;Tactile cues;Handout   Comprehension Verbalized understanding;Returned demonstration             PT Long Term Goals - 12/29/14 1311    PT LONG TERM GOAL #1   Title pt will be  independent with HEP for continued core strengthening   Time 8   Period Weeks   Status On-going   PT LONG TERM GOAL #2   Title pt will demonstrate 4/5 or greater MMT of the LEs   Time 8   Period Weeks   Status On-going   PT LONG TERM GOAL #3   Title pt will report increased walking tolerance to >20minutes   Time 8   Period Weeks   Status On-going   PT LONG TERM GOAL #4   Title pt will decrease FOTO to 45% or less   Time 8   Period Weeks   Status On-going               Plan - 12/31/14 1005    Clinical Impression Statement Pt emotionally in a better place today, Pt able to tolerate stretching exercises very well. Pt will continue to benefit from skilled PT to address pain, core activation and overall strength and endurance.   Pt will benefit from skilled therapeutic intervention in order to improve on the following deficits Abnormal gait;Decreased mobility;Pain;Hypomobility;Difficulty walking;Postural dysfunction   Rehab Potential Good   Clinical Impairments Affecting Rehab Potential chronic low back pain due to progressive scoliosis, umbar stenosis, and facet and segmental    PT Frequency 2x / week   PT Duration 8 weeks   PT Treatment/Interventions ADLs/Self Care Home Management;Electrical Stimulation;Moist Heat;Therapeutic exercise;Neuromuscular re-education;Patient/family education;Manual techniques;Passive range of motion;Taping   PT Next Visit Plan Continue with flexibility, core activation, - Pilates-  Future considerations: heel lift?, STM and modalities as needed   Consulted and Agree with Plan of Care Patient        Problem List Patient Active Problem List   Diagnosis Date Noted  . Disorder of sacroiliac joint 12/22/2014  . Chronic low back pain 12/22/2014  . Spinal stenosis, lumbar region, with neurogenic claudication 10/06/2013  . Acquired scoliosis 11/27/2011  . Lumbosacral spondylosis without myelopathy 05/22/2011    NAUMANN-HOUEGNIFIO,Denece Shearer  PTA 12/31/2014, 10:27 AM  [facility] [address], [place], [place], [address] Phone: [phone]   Fax:  [phone]  Name: [name] MRN: [id number] Date of Birth: 06-23-1945

## 2014-12-31 NOTE — Patient Instructions (Signed)
Piriformis (Supine)     Each exercise for 20 to 30 second hold and repeat x 3 on each side  Cross legs, right on top. Gently pull other knee toward chest until stretch is felt in buttock/hip of top leg. Hold ____ seconds. Repeat ____ times per set. Do ____ sets per session. Do ____ sessions per day.  Hip Stretch  Put right ankle over left knee. Let right knee fall downward, but keep ankle in place. Feel the stretch in hip. May push down gently with hand to feel stretch. Hold ____ seconds while counting out loud. Repeat with other leg. Repeat ____ times. Do ____ sessions per day.    Cross left leg over other thigh and place elbow over outside of knee. Gently stretch buttock muscles by pushing bent knee across body. Hold ____ seconds. Repeat ____ times per set. Do ____ sets per session. Do ____ sessions per day.  Stretching: Piriformis (Supine)  Pull right knee toward opposite shoulder. Hold ____ seconds. Relax. Repeat ____ times per set. Do ____ sets per session. Do ____ sessions per day.  Piriformis Stretch, Kneeling Modified [place]  From hands and knees, slide one leg backward, turn bent other leg out slightly to side. Rest weight on outside of bent leg. If extended hip is elevated place a blanket or towel underneath to relax hip. With back straight, lay trunk forward over bent leg. Hold ___ seconds.  Repeat ___ times per session. Do ___ sessions per day.  Copyright  VHI. All rights reserved.

## 2015-01-06 ENCOUNTER — Encounter: Payer: Self-pay | Admitting: Physical Therapy

## 2015-01-06 ENCOUNTER — Ambulatory Visit: Payer: Medicare Other | Admitting: Physical Therapy

## 2015-01-06 DIAGNOSIS — M4120 Other idiopathic scoliosis, site unspecified: Secondary | ICD-10-CM

## 2015-01-06 DIAGNOSIS — M545 Low back pain, unspecified: Secondary | ICD-10-CM

## 2015-01-06 DIAGNOSIS — E7800 Pure hypercholesterolemia, unspecified: Secondary | ICD-10-CM | POA: Diagnosis not present

## 2015-01-06 DIAGNOSIS — G8929 Other chronic pain: Secondary | ICD-10-CM

## 2015-01-06 DIAGNOSIS — R262 Difficulty in walking, not elsewhere classified: Secondary | ICD-10-CM | POA: Diagnosis not present

## 2015-01-06 DIAGNOSIS — I10 Essential (primary) hypertension: Secondary | ICD-10-CM | POA: Diagnosis not present

## 2015-01-06 DIAGNOSIS — E559 Vitamin D deficiency, unspecified: Secondary | ICD-10-CM | POA: Diagnosis not present

## 2015-01-06 NOTE — Therapy (Signed)
[facility] [address] [place], [place], [address] Phone: [phone]   Fax:  [phone]  Physical Therapy Treatment  Patient Details  Name: MILISA LUU MRN: [id number] Date of Birth: [birth year] Referring Provider: Alysia Penna, MD  Encounter Date: 01/06/2015      PT End of Session - 01/06/15 1321    Visit Number 4   Number of Visits 16   Date for PT Re-Evaluation 02/16/15   PT Start Time 1233   PT Stop Time 1316   PT Time Calculation (min) 43 min   Activity Tolerance Patient tolerated treatment well   Behavior During Therapy [facility] for tasks assessed/performed      Past Medical History  Diagnosis Date  . Congenital musculoskeletal deformity of spine   . Spinal stenosis, lumbar region, without neurogenic claudication   . Lumbago   . Lumbosacral spondylosis without myelopathy   . Incontinence of bowel 09/2013    Past Surgical History  Procedure Laterality Date  . Foot surgery  07/2011    There were no vitals filed for this visit.  Visit Diagnosis:  Idiopathic scoliosis  Chronic bilateral low back pain without sciatica      Subjective Assessment - 01/06/15 1238    Subjective Constant pain mainly LT.    Currently in Pain? Yes   Pain Score 4    Pain Location Back   Pain Orientation Left   Pain Descriptors / Indicators Constant;Sore   Aggravating Factors  Walking, sitting, standing   Pain Relieving Factors Meds, laying down   Multiple Pain Sites No                         [facility] Adult PT Treatment/Exercise - 01/06/15 0001    Self-Care   Self-Care --  Sleeping positions that are decompressive.   Lumbar Exercises: Supine   Ab Set --  TA ex 8x   Heel Slides 5 reps   Bent Knee Raise 10 reps;2 seconds                PT Education - 01/06/15 1256    Education provided Yes   Education Details Sleeping positions that are decompression, TA exercises, and ADL  body mechanics   Person(s) Educated Patient   Methods Explanation;Demonstration;Handout   Comprehension Verbalized understanding;Returned demonstration             PT Long Term Goals - 01/06/15 1322    PT LONG TERM GOAL #1   Title pt will be independent with HEP for continued core strengthening   Time 8   Period Weeks   Status On-going   PT LONG TERM GOAL #3   Title pt will report increased walking tolerance to >89minutes   Time 8   Period Weeks   Status On-going  too early for any significant changes               Plan - 01/06/15 1323    Clinical Impression Statement Pt compliant with home stretches, adding basic core strength with Pilates bias today. She continues to have difficulty walking and standing for any length of time. Educating her in decompressive sleeping positions will hopefully give her some relief when sleeping or help her quality of sleeping.    Pt will benefit from skilled therapeutic intervention in order to improve on the following deficits Abnormal gait;Decreased mobility;Pain;Hypomobility;Difficulty walking;Postural dysfunction   Rehab Potential Good   Clinical Impairments Affecting Rehab Potential chronic low  back pain due to progressive scoliosis, umbar stenosis, and facet and segmental    PT Frequency 2x / week   PT Duration 8 weeks   PT Treatment/Interventions ADLs/Self Care Home Management;Electrical Stimulation;Moist Heat;Therapeutic exercise;Neuromuscular re-education;Patient/family education;Manual techniques;Passive range of motion;Taping   PT Next Visit Plan Continue with flexibility, core activation, - Pilates-  Future considerations: heel lift?, STM and modalities as needed   Consulted and Agree with Plan of Care Patient        Problem List Patient Active Problem List   Diagnosis Date Noted  . Disorder of sacroiliac joint 12/22/2014  . Chronic low back pain 12/22/2014  . Spinal stenosis, lumbar region, with neurogenic  claudication 10/06/2013  . Acquired scoliosis 11/27/2011  . Lumbosacral spondylosis without myelopathy 05/22/2011    Arabela Basaldua, PTA 01/06/2015, 1:27 PM  [facility] [address], [place], [place], [address] Phone: [phone]   Fax:  [phone]  Name: JENNIFFER MOHAMMAD MRN: [id number] Date of Birth: September 23, 1945

## 2015-01-06 NOTE — Patient Instructions (Addendum)
 Lifting Principles  .Maintain proper posture and head alignment. .Slide object as close as possible before lifting. .Move obstacles out of the way. .Test before lifting; ask for help if too heavy. .Tighten stomach muscles without holding breath. .Use smooth movements; do not jerk. .Use legs to do the work, and pivot with feet. .Distribute the work load symmetrically and close to the center of trunk. .Push instead of pull whenever possible.   Squat down and hold basket close to stand. Use leg muscles to do the work.    Avoid twisting or bending back. Pivot around using foot movements, and bend at knees if needed when reaching for articles.        Getting Into / Out of Bed   Lower self to lie down on one side by raising legs and lowering head at the same time. Use arms to assist moving without twisting. Bend both knees to roll onto back if desired. To sit up, start from lying on side, and use same move-ments in reverse. Keep trunk aligned with legs.    Shift weight from front foot to back foot as item is lifted off shelf.    When leaning forward to pick object up from floor, extend one leg out behind. Keep back straight. Hold onto a sturdy support with other hand.      Sit upright, head facing forward. Try using a roll to support lower back. Keep shoulders relaxed, and avoid rounded back. Keep hips level with knees. Avoid crossing legs for long periods.    Lower abdominal/core stability exercises  1. Practice your breathing technique: Inhale through your nose expanding your belly and rib cage. Try not to breathe into your chest. Exhale slowly and gradually out your mouth feeling a sense of softness to your body. Practice multiple times. This can be performed unlimited.  2. Finding the lower abdominals. Laying on your back with the knees bent, place your fingers just below your belly button. Using your breathing technique from above, on your exhale gently pull the  belly button away from your fingertips without tensing any other muscles. Practice this 5x. Next, as you exhale, draw belly button inwards and hold onto it...then feel as if you are pulling that muscle across your pelvis like you are tightening a belt. This can be hard to do at first so be patient and practice. Do 5-10 reps 1-3 x day. Always recognize quality over quantity; if your abdominal muscles become tired you will notice you may tighten/contract other muscles. This is the time to take a break.   Practice this first laying on your back, then in sitting, progressing to standing and finally adding it to all your daily movements.   3. Finding your pelvic floor. Using the breathing technique above, when your exhale, this time draw your pelvic floor muscles up as if you were attempting to stop the flow of urination. Be careful NOT to tense any other muscles. This can be hard, BE PATIENT. Try to hold up to 10 seconds repeating 10x. Try 2x a day. Once you feel you are doing this well, add this contraction to exercise #2. First contracting your pelvic floor followed by lower abdominals.  4. Adding leg movements. Add the following leg movements to challenge your ability to keep your core stable:  1. Single leg drop outs: Laying on your back with knees bent feet flat. Inhale,  dropping one knee outward KEEPING YOUR PELVIS STILL. Exhale as you bring the leg back, simultaneously performing   your lower abdominal contraction. Do 5-10 on each leg.  2. Marching: While keeping your pelvis still, lift the right foot a few inches, put it down then lift left foot. This will mimic a march. Start slow to establish control. Once you have control you may speed it up. Do 10-20x. You MUST keep your lower abdominlas contracted while you march. Breathe naturally   3. Single leg slides: Inhale while you slowly slide one leg out keeping your pelvis still. Only slide your leg as far as you can keep your pelvis still. Exhale as you  bring the leg back to the start, contracting the lower abdominals as you do that. Keep your upper body relaxed. Do 5-10 on each side. Lower abdominal/core stability exercises  1. Practice your breathing technique: Inhale through your nose expanding your belly and rib cage. Try not to breathe into your chest. Exhale slowly and gradually out your mouth feeling a sense of softness to your body. Practice multiple times. This can be performed unlimited.  2. Finding the lower abdominals. Laying on your back with the knees bent, place your fingers just below your belly button. Using your breathing technique from above, on your exhale gently pull the belly button away from your fingertips without tensing any other muscles. Practice this 5x. Next, as you exhale, draw belly button inwards and hold onto it...then feel as if you are pulling that muscle across your pelvis like you are tightening a belt. This can be hard to do at first so be patient and practice. Do 5-10 reps 1-3 x day. Always recognize quality over quantity; if your abdominal muscles become tired you will notice you may tighten/contract other muscles. This is the time to take a break.   Practice this first laying on your back, then in sitting, progressing to standing and finally adding it to all your daily movements.   3. Finding your pelvic floor. Using the breathing technique above, when your exhale, this time draw your pelvic floor muscles up as if you were attempting to stop the flow of urination. Be careful NOT to tense any other muscles. This can be hard, BE PATIENT. Try to hold up to 10 seconds repeating 10x. Try 2x a day. Once you feel you are doing this well, add this contraction to exercise #2. First contracting your pelvic floor followed by lower abdominals.   4. Adding leg movements. Add the following leg movements to challenge your ability to keep your core stable:  1. Single leg drop outs: Laying on your back with knees bent feet flat.  Inhale,  dropping one knee outward KEEPING YOUR PELVIS STILL. Exhale as you bring the leg back, simultaneously performing your lower abdominal contraction. Do 5-10 on each leg.   2. Marching: While keeping your pelvis still, lift the right foot a few inches, put it down then lift left foot. This will mimic a march. Start slow to establish control. Once you have control you may speed it up. Do 10-20x. You MUST keep your lower abdominlas contracted while you march. Breathe naturally    3. Single leg slides: Inhale while you slowly slide one leg out keeping your pelvis still. Only slide your leg as far as you can keep your pelvis still. Exhale as you bring the leg back to the start, contracting the lower abdominals as you do that. Keep your upper body relaxed. Do 5-10 on each side.

## 2015-01-08 ENCOUNTER — Ambulatory Visit (HOSPITAL_BASED_OUTPATIENT_CLINIC_OR_DEPARTMENT_OTHER): Payer: Medicare Other | Admitting: Physical Medicine & Rehabilitation

## 2015-01-08 ENCOUNTER — Encounter: Payer: Self-pay | Admitting: Physical Medicine & Rehabilitation

## 2015-01-08 VITALS — BP 130/80 | HR 86 | Resp 16

## 2015-01-08 DIAGNOSIS — M47817 Spondylosis without myelopathy or radiculopathy, lumbosacral region: Secondary | ICD-10-CM | POA: Diagnosis not present

## 2015-01-08 NOTE — Progress Notes (Signed)
Medical Acupuncture treatment #1  Indication is lumbar spondylosis with severe pain  The treatment was discussed with the patient, she elected to proceed. Patient placed in a prone position. Areas were palpated. needles inserted at GV 4, BL 23 BL 24 bilateral as well as BL 52 bilateral 2 Hz stimulation

## 2015-01-08 NOTE — Patient Instructions (Signed)
received acupuncture treatment today. No vigorous exercise or activities today. You are able to drive.

## 2015-01-13 ENCOUNTER — Encounter: Payer: Self-pay | Admitting: Physical Therapy

## 2015-01-13 ENCOUNTER — Ambulatory Visit: Payer: Medicare Other | Attending: Physical Medicine & Rehabilitation | Admitting: Physical Therapy

## 2015-01-13 DIAGNOSIS — G8929 Other chronic pain: Secondary | ICD-10-CM | POA: Diagnosis not present

## 2015-01-13 DIAGNOSIS — M4120 Other idiopathic scoliosis, site unspecified: Secondary | ICD-10-CM | POA: Diagnosis not present

## 2015-01-13 DIAGNOSIS — M545 Low back pain, unspecified: Secondary | ICD-10-CM

## 2015-01-13 DIAGNOSIS — R262 Difficulty in walking, not elsewhere classified: Secondary | ICD-10-CM | POA: Diagnosis not present

## 2015-01-13 NOTE — Patient Instructions (Signed)
Piriformis Stretch, Sitting    Sit, one ankle on opposite knee, same-side hand on crossed knee. Push down on knee, keeping spine straight. Lean torso forward, with flat back, until tension is felt in hamstrings and gluteals of crossed-leg side. Hold _20__ seconds.  Repeat _2__ times per session. Do __1-2_ sessions per day.  Copyright  VHI. All rights reserved.

## 2015-01-13 NOTE — Therapy (Signed)
[facility] [address], [place], [place], [address] Phone: [phone]   Fax:  [phone]  Physical Therapy Treatment  Patient Details  Name: [name] MRN: [id number] Date of Birth: 03-Jul-1945 Referring Provider: Alysia Penna, MD  Encounter Date: 01/13/2015      PT End of Session - 01/13/15 1108    Visit Number 5   Number of Visits 16   Date for PT Re-Evaluation 02/16/15   PT Start Time 1100   PT Stop Time 1200   PT Time Calculation (min) 60 min   Activity Tolerance Patient tolerated treatment well   Behavior During Therapy [facility] for tasks assessed/performed      Past Medical History  Diagnosis Date  . Congenital musculoskeletal deformity of spine   . Spinal stenosis, lumbar region, without neurogenic claudication   . Lumbago   . Lumbosacral spondylosis without myelopathy   . Incontinence of bowel 09/2013    Past Surgical History  Procedure Laterality Date  . Foot surgery  07/2011    There were no vitals filed for this visit.  Visit Diagnosis:  Idiopathic scoliosis  Chronic bilateral low back pain without sciatica  Difficulty walking      Subjective Assessment - 01/13/15 1106    Subjective I'm frustrated with my exercises. Was able to work on her sewing yesterday taking some Aleve.    Currently in Pain? Yes   Pain Score 2    Pain Location Back   Pain Orientation Left;Mid   Pain Descriptors / Indicators Discomfort   Aggravating Factors  Walking,sitting, standing   Pain Relieving Factors meds, laying down   Multiple Pain Sites No                         [facility] Adult PT Treatment/Exercise - 01/13/15 0001    Lumbar Exercises: Stretches   Lower Trunk Rotation --  Seated thoracic rotation bil 5x. VC to lift rib cage slightl   Piriformis Stretch 2 reps;20 seconds  Sitting and supine   Lumbar Exercises: Supine   Ab Set 10 reps;2 seconds   Heel Slides 10 reps   Bent Knee Raise 10 reps   Other Supine Lumbar Exercises Ball squeeze with PF/TA contraction. 10x    Other Supine Lumbar Exercises Single leg drop outs 10 bil added to HEP   Moist Heat Therapy   Number Minutes Moist Heat 15 Minutes   Moist Heat Location Lumbar Spine  sidelying with pillows   Electrical Stimulation   Electrical Stimulation Location Lt lumbar   Electrical Stimulation Action IFC   Electrical Stimulation Goals Pain   Manual Therapy   Manual Therapy --                PT Education - 01/13/15 1147    Education provided Yes   Education Details Sitting piriformis stretch   Person(s) Educated Patient   Methods Explanation;Demonstration;Tactile cues;Verbal cues;Handout   Comprehension Verbalized understanding;Returned demonstration             PT Long Term Goals - 01/13/15 1145    PT LONG TERM GOAL #1   Title pt will be independent with HEP for continued core strengthening   Time 8   Period Weeks   Status On-going   PT LONG TERM GOAL #3   Title pt will report increased walking tolerance to >60minutes   Time 8   Period Weeks   Status On-going   PT LONG TERM  GOAL #4   Title pt will decrease FOTO to 45% or less   Time 8   Period Weeks   Status On-going               Plan - 01/13/15 1143    Clinical Impression Statement pt was frustrated with her exercises, not understanding them, We reviewed them and at the end of the session she felt more comfortable with them. She reports not having any extra pillows or 'anything" at home to help with her sleeping posiitons. We tried adding Estim to tx today at her Lt  lumbar.     Pt will benefit from skilled therapeutic intervention in order to improve on the following deficits Abnormal gait;Decreased mobility;Pain;Hypomobility;Difficulty walking;Postural dysfunction   Rehab Potential Good   Clinical Impairments Affecting Rehab Potential chronic low back pain due to progressive scoliosis, umbar stenosis, and  facet and segmental    PT Frequency 2x / week   PT Duration 8 weeks   PT Treatment/Interventions ADLs/Self Care Home Management;Electrical Stimulation;Moist Heat;Therapeutic exercise;Neuromuscular re-education;Patient/family education;Manual techniques;Passive range of motion;Taping   PT Next Visit Plan Review seated hip stretches, continue withcore exercises. Pt reports trying a heel lift but it did not help. See if Estim helped.    Consulted and Agree with Plan of Care Patient        Problem List Patient Active Problem List   Diagnosis Date Noted  . Disorder of sacroiliac joint 12/22/2014  . Chronic low back pain 12/22/2014  . Spinal stenosis, lumbar region, with neurogenic claudication 10/06/2013  . Acquired scoliosis 11/27/2011  . Lumbosacral spondylosis without myelopathy 05/22/2011    Laird Runnion ,PTA 01/13/2015, 11:49 AM  [facility] [address], [place], [place], [address] Phone: [phone]   Fax:  [phone]  Name: [name] MRN: [id number] Date of Birth: 1945-11-03

## 2015-01-15 ENCOUNTER — Encounter: Payer: Self-pay | Admitting: Physical Therapy

## 2015-01-15 ENCOUNTER — Ambulatory Visit: Payer: Medicare Other | Admitting: Physical Therapy

## 2015-01-15 DIAGNOSIS — G8929 Other chronic pain: Secondary | ICD-10-CM

## 2015-01-15 DIAGNOSIS — M545 Low back pain, unspecified: Secondary | ICD-10-CM

## 2015-01-15 DIAGNOSIS — R262 Difficulty in walking, not elsewhere classified: Secondary | ICD-10-CM | POA: Diagnosis not present

## 2015-01-15 DIAGNOSIS — M4120 Other idiopathic scoliosis, site unspecified: Secondary | ICD-10-CM | POA: Diagnosis not present

## 2015-01-15 NOTE — Therapy (Signed)
[facility] [address] [place], [place], [address] Phone: [phone]   Fax:  [phone]  Physical Therapy Treatment  Patient Details  Name: [name] MRN: [id number] Date of Birth: 31-Oct-1945 Referring Provider: Alysia Penna, MD  Encounter Date: 01/15/2015      PT End of Session - 01/15/15 1107    Visit Number 6   Number of Visits 16   Date for PT Re-Evaluation 02/16/15   PT Start Time 1059   PT Stop Time 1150   PT Time Calculation (min) 51 min   Activity Tolerance Patient tolerated treatment well   Behavior During Therapy [facility] for tasks assessed/performed      Past Medical History  Diagnosis Date  . Congenital musculoskeletal deformity of spine   . Spinal stenosis, lumbar region, without neurogenic claudication   . Lumbago   . Lumbosacral spondylosis without myelopathy   . Incontinence of bowel 09/2013    Past Surgical History  Procedure Laterality Date  . Foot surgery  07/2011    There were no vitals filed for this visit.  Visit Diagnosis:  Idiopathic scoliosis  Chronic bilateral low back pain without sciatica  Difficulty walking      Subjective Assessment - 01/15/15 1105    Subjective Much better today, even feels better after doign the Estim and heat. Her muscles are just sore all over...   Currently in Pain? Yes   Pain Score 2    Pain Location --  General muscles all over   Pain Descriptors / Indicators Aching   Multiple Pain Sites No                         [facility] Adult PT Treatment/Exercise - 01/15/15 0001    Lumbar Exercises: Aerobic   Stationary Bike Nustep L1 x 5 min   Lumbar Exercises: Supine   Other Supine Lumbar Exercises scapular unattached yellow 10x each bil.    Moist Heat Therapy   Number Minutes Moist Heat 15 Minutes   Moist Heat Location Lumbar Spine  sidelying with pillows   Electrical Stimulation   Electrical Stimulation Location Lt  lumbar   Electrical Stimulation Action IFC   Electrical Stimulation Goals Pain                PT Education - 01/15/15 1122    Education provided Yes   Education Details Supine yellow band scapular unanattached series for HEP   Person(s) Educated Patient   Methods Explanation;Demonstration;Tactile cues;Verbal cues;Handout   Comprehension Verbalized understanding;Returned demonstration             PT Long Term Goals - 01/15/15 1124    PT LONG TERM GOAL #1   Title pt will be independent with HEP for continued core strengthening   Time 8   Period Weeks   Status On-going   PT LONG TERM GOAL #3   Title pt will report increased walking tolerance to >66minutes   Time 8   Period Weeks   Status On-going  5 min   PT LONG TERM GOAL #4   Title pt will decrease FOTO to 45% or less   Time 8   Period Weeks   Status On-going  Will do on visit 10               Plan - 01/15/15 1134    Clinical Impression Statement HEP is going betterand was advanced this week to include band work in  supine for postural strength & endurance. The estim and heat has given her a days relief from her back pain. She still cannot stand or walking greater than 5 min which is limited by pain.    Pt will benefit from skilled therapeutic intervention in order to improve on the following deficits Abnormal gait;Decreased mobility;Pain;Hypomobility;Difficulty walking;Postural dysfunction   Rehab Potential Good   Clinical Impairments Affecting Rehab Potential chronic low back pain due to progressive scoliosis, umbar stenosis, and facet and segmental    PT Frequency 2x / week   PT Duration 8 weeks   PT Treatment/Interventions ADLs/Self Care Home Management;Electrical Stimulation;Moist Heat;Therapeutic exercise;Neuromuscular re-education;Patient/family education;Manual techniques;Passive range of motion;Taping   PT Next Visit Plan Review band exercises, continue with Nustep, hip stretching   Consulted  and Agree with Plan of Care Patient        Problem List Patient Active Problem List   Diagnosis Date Noted  . Disorder of sacroiliac joint 12/22/2014  . Chronic low back pain 12/22/2014  . Spinal stenosis, lumbar region, with neurogenic claudication 10/06/2013  . Acquired scoliosis 11/27/2011  . Lumbosacral spondylosis without myelopathy 05/22/2011    Cathe Bilger, PTA 01/15/2015, 11:38 AM  [facility] [address], [place], [place], [address] Phone: [phone]   Fax:  [phone]  Name: [name] MRN: [id number] Date of Birth: 1945/12/31

## 2015-01-15 NOTE — Patient Instructions (Signed)
  PNF Strengthening: Resisted   Standing with resistive band around each hand, bring right arm up and away, thumb back. Repeat _10___ times per set. Do _2___ sets per session. Do _1-2___ sessions per day.      Resisted Horizontal Abduction: Bilateral   Sit or stand, tubing in both hands, arms out in front. Keeping arms straight, pinch shoulder blades together and stretch arms out. Repeat _10___ times per set. Do 2____ sets per session. Do _1-2___ sessions per day.                  Scapular Retraction: Elbow Flexion (Standing) or sitting   With elbows bent to 90, pinch shoulder blades together and rotate arms out, keeping elbows bent. Repeat 5-_10___ times per set. Do _1___ sets per session. Do many____ sessions per day.

## 2015-01-18 ENCOUNTER — Other Ambulatory Visit: Payer: Self-pay | Admitting: Family Medicine

## 2015-01-18 ENCOUNTER — Encounter: Payer: Medicare Other | Admitting: Physical Therapy

## 2015-01-18 ENCOUNTER — Ambulatory Visit: Payer: Medicare Other | Admitting: Physical Medicine & Rehabilitation

## 2015-01-18 ENCOUNTER — Ambulatory Visit
Admission: RE | Admit: 2015-01-18 | Discharge: 2015-01-18 | Disposition: A | Discharge status: Home / Self Care | Payer: Medicare Other | Source: Ambulatory Visit | Attending: Family Medicine | Admitting: Family Medicine

## 2015-01-18 DIAGNOSIS — R0781 Pleurodynia: Secondary | ICD-10-CM | POA: Diagnosis not present

## 2015-01-20 ENCOUNTER — Encounter: Payer: Self-pay | Admitting: Physical Therapy

## 2015-01-20 ENCOUNTER — Ambulatory Visit: Payer: Medicare Other | Admitting: Physical Therapy

## 2015-01-20 DIAGNOSIS — M545 Low back pain, unspecified: Secondary | ICD-10-CM

## 2015-01-20 DIAGNOSIS — R262 Difficulty in walking, not elsewhere classified: Secondary | ICD-10-CM

## 2015-01-20 DIAGNOSIS — M4120 Other idiopathic scoliosis, site unspecified: Secondary | ICD-10-CM

## 2015-01-20 DIAGNOSIS — G8929 Other chronic pain: Secondary | ICD-10-CM | POA: Diagnosis not present

## 2015-01-20 NOTE — Therapy (Signed)
[facility] [address], [place], [place], [address] Phone: [phone]   Fax:  [phone]  Physical Therapy Treatment  Patient Details  Name: [name] MRN: [id number] Date of Birth: Aug 26, 1945 Referring Provider: Alysia Penna, MD  Encounter Date: 01/20/2015      PT End of Session - 01/20/15 1103    Visit Number 7   Number of Visits 16   Date for PT Re-Evaluation 02/16/15   PT Start Time 1058   PT Stop Time 1155   PT Time Calculation (min) 57 min   Activity Tolerance Patient tolerated treatment well   Behavior During Therapy [facility] for tasks assessed/performed      Past Medical History  Diagnosis Date  . Congenital musculoskeletal deformity of spine   . Spinal stenosis, lumbar region, without neurogenic claudication   . Lumbago   . Lumbosacral spondylosis without myelopathy   . Incontinence of bowel 09/2013    Past Surgical History  Procedure Laterality Date  . Foot surgery  07/2011    There were no vitals filed for this visit.  Visit Diagnosis:  Idiopathic scoliosis  Chronic bilateral low back pain without sciatica  Difficulty walking      Subjective Assessment - 01/20/15 1059    Subjective I upholstered a lot this weekend and did not take rest breaks and as a result I hurt terribly throughout the weekend.    Currently in Pain? Yes   Pain Score 5    Pain Location Shoulder   Pain Orientation Right;Left   Pain Descriptors / Indicators Constant;Aching   Aggravating Factors  I over did it big time.   Pain Relieving Factors Meds   Multiple Pain Sites No                         [facility] Adult PT Treatment/Exercise - 01/20/15 0001    Lumbar Exercises: Stretches   Single Knee to Chest Stretch 3 reps;20 seconds  Then knee to opposite shoulder bil 3x20 sec   Pelvic Tilt 3 reps;10 seconds   Piriformis Stretch 3 reps;20 seconds   Lumbar Exercises: Aerobic   Stationary  Bike Nustep L1 x 7 min   Lumbar Exercises: Standing   Other Standing Lumbar Exercises Weight shifting 3 ways 1 min for Multifidus   VC for posture and TA   Lumbar Exercises: Supine   Other Supine Lumbar Exercises scapular unattached red 10x each bil.    Moist Heat Therapy   Number Minutes Moist Heat 15 Minutes   Moist Heat Location Lumbar Spine  sidelying with pillows   Electrical Stimulation   Electrical Stimulation Location Lt lumbar   Electrical Stimulation Action IFC   Electrical Stimulation Goals Pain                     PT Long Term Goals - 01/20/15 1140    PT LONG TERM GOAL #1   Title pt will be independent with HEP for continued core strengthening   Period Weeks   Status On-going  Inconsistent   PT LONG TERM GOAL #3   Title pt will report increased walking tolerance to >67minutes   Time 8   Period Weeks   Status On-going   PT LONG TERM GOAL #4   Title pt will decrease FOTO to 45% or less   Time 8   Period Weeks   Status On-going  Plan - 01/20/15 1130    Clinical Impression Statement Pt definitely has a hard time self monitoring herself at home: overworking, not changing her posiotns nor doing her stretching consistently. She can cross her LT knee over her RT one to put shoes/socks on now and she can sleep better using pillows between her knees. Still no improvement standing or walking.    Pt will benefit from skilled therapeutic intervention in order to improve on the following deficits Abnormal gait;Decreased mobility;Pain;Hypomobility;Difficulty walking;Postural dysfunction   Rehab Potential Good   Clinical Impairments Affecting Rehab Potential chronic low back pain due to progressive scoliosis, umbar stenosis, and facet and segmental    PT Frequency 2x / week   PT Duration 8 weeks   PT Treatment/Interventions ADLs/Self Care Home Management;Electrical Stimulation;Moist Heat;Therapeutic exercise;Neuromuscular  re-education;Patient/family education;Manual techniques;Passive range of motion;Taping   PT Next Visit Plan Add bridge to exercise program, see if PT can tolerate walking on treadmill?   Consulted and Agree with Plan of Care Patient        Problem List Patient Active Problem List   Diagnosis Date Noted  . Disorder of sacroiliac joint 12/22/2014  . Chronic low back pain 12/22/2014  . Spinal stenosis, lumbar region, with neurogenic claudication 10/06/2013  . Acquired scoliosis 11/27/2011  . Lumbosacral spondylosis without myelopathy 05/22/2011    COCHRAN,JENNIFER, PTA 01/20/2015, 11:41 AM  [facility] [address] [place], [place], [address] Phone: [phone]   Fax:  [phone]  Name: [name] MRN: [id number] Date of Birth: 28-Sep-1945

## 2015-01-22 ENCOUNTER — Encounter: Payer: Self-pay | Admitting: Physical Medicine & Rehabilitation

## 2015-01-22 ENCOUNTER — Encounter: Payer: Medicare Other | Attending: Physical Medicine & Rehabilitation

## 2015-01-22 ENCOUNTER — Ambulatory Visit (HOSPITAL_BASED_OUTPATIENT_CLINIC_OR_DEPARTMENT_OTHER): Payer: Medicare Other | Admitting: Physical Medicine & Rehabilitation

## 2015-01-22 ENCOUNTER — Other Ambulatory Visit: Payer: Self-pay | Admitting: Family Medicine

## 2015-01-22 VITALS — BP 123/70 | HR 72 | Resp 14

## 2015-01-22 DIAGNOSIS — M47816 Spondylosis without myelopathy or radiculopathy, lumbar region: Secondary | ICD-10-CM

## 2015-01-22 DIAGNOSIS — M47817 Spondylosis without myelopathy or radiculopathy, lumbosacral region: Secondary | ICD-10-CM | POA: Diagnosis not present

## 2015-01-22 DIAGNOSIS — R9389 Abnormal findings on diagnostic imaging of other specified body structures: Secondary | ICD-10-CM

## 2015-01-22 MED ORDER — TRAMADOL HCL 50 MG PO TABS: 50.0000 mg | ORAL_TABLET | Freq: Three times a day (TID) | ORAL | Status: DC

## 2015-01-22 NOTE — Progress Notes (Signed)
Left Lumbar L3, L4  medial branch blocks and L 5 dorsal ramus injection under fluoroscopic guidance   Indication: Left Lumbar pain which is not relieved by medication management or other conservative care and interfering with self-care and mobility.  Informed consent was obtained after describing risks and benefits of the procedure with the patient, this includes bleeding, bruising, infection, paralysis and medication side effects.  The patient wishes to proceed and has given written consent.  The patient was placed in a prone position.  The lumbar area was marked and prepped with Betadine.  One mL of 1% lidocaine was injected into each of 3 areas into the skin and subcutaneous tissue.  Then a 22-gauge 5in spinal needle was inserted targeting the junction of the left S1 superior articular process and sacral ala junction.  Needle was advanced under fluoroscopic guidance.  Bone contact was made.  Omnipaque 180 was injected x 0.5 mL demonstrating no intravascular uptake.  Then a solution containing one mL of 4 mg per mL dexamethasone and 3 mL of 2% MPF lidocaine was injected x 0.5 mL.  Then the left L5 superior articular process in transverse process junction was targeted.  Bone contact was made.  Omnipaque 180 was injected x 0.5 mL demonstrating no intravascular uptake.  Then a solution containing one mL of 4 mg per mL dexamethasone and 3 mL of 2% MPF lidocaine was injected x 0.5 mL.  Then the left L4 superior articular process in transverse process junction was targeted.  Bone contact was made.  Omnipaque 180 was injected x 0.5 mL demonstrating no intravascular uptake.  Then a solution containing one mL of 4 mg per mL dexamethasone and 3 mL of 2% MPF lidocaine was injected x 0.5 mL.  Patient tolerated procedure well.  Post procedure instructions were given.

## 2015-01-22 NOTE — Progress Notes (Signed)
  PROCEDURE RECORD [name]   Name: [name] DOB:Jan 25, 1945 MRN: [id number]  Date:01/22/2015  Physician: Alysia Penna, MD    Nurse/CMA: Gara Kroner, CMA  Allergies: No Known Allergies  Consent Signed: Yes.    Is patient diabetic? No.  CBG today? NA  Pregnant: No. LMP: No LMP recorded. Patient is postmenopausal. (age 68-55)  Anticoagulants: no Anti-inflammatory: no Antibiotics: no  Procedure: Medial Branch Block  Position: Prone Start Time: 11:03am End Time: 11:10am Fluoro Time: 31  RN/CMA [name]	[name]    Time 10:30am 11:16am    BP 123/70 137/75    Pulse 72 64    Respirations 14 14    O2 Sat 95 96    S/S 6 6    Pain Level 0 0     D/C home with husband, patient A & O X 3, D/C instructions reviewed, and sits independently.

## 2015-01-22 NOTE — Patient Instructions (Signed)

## 2015-01-25 ENCOUNTER — Ambulatory Visit: Payer: Medicare Other | Admitting: Physical Therapy

## 2015-01-25 ENCOUNTER — Encounter: Payer: Self-pay | Admitting: Physical Therapy

## 2015-01-25 DIAGNOSIS — R262 Difficulty in walking, not elsewhere classified: Secondary | ICD-10-CM

## 2015-01-25 DIAGNOSIS — M545 Low back pain, unspecified: Secondary | ICD-10-CM

## 2015-01-25 DIAGNOSIS — G8929 Other chronic pain: Secondary | ICD-10-CM | POA: Diagnosis not present

## 2015-01-25 DIAGNOSIS — M4120 Other idiopathic scoliosis, site unspecified: Secondary | ICD-10-CM | POA: Diagnosis not present

## 2015-01-25 NOTE — Therapy (Signed)
[facility] [address], [place], [place], [address] Phone: [phone]   Fax:  [phone]  Physical Therapy Treatment  Patient Details  Name: [name] MRN: [id number] Date of Birth: 30-Apr-1945 Referring Provider: Alysia Penna, MD  Encounter Date: 01/25/2015      PT End of Session - 01/25/15 1129    Visit Number 8   Number of Visits 16   Date for PT Re-Evaluation 02/16/15   PT Start Time 1100   PT Stop Time 1145   PT Time Calculation (min) 45 min   Activity Tolerance Patient tolerated treatment well   Behavior During Therapy [facility] for tasks assessed/performed      Past Medical History  Diagnosis Date  . Congenital musculoskeletal deformity of spine   . Spinal stenosis, lumbar region, without neurogenic claudication   . Lumbago   . Lumbosacral spondylosis without myelopathy   . Incontinence of bowel 09/2013    Past Surgical History  Procedure Laterality Date  . Foot surgery  07/2011    There were no vitals filed for this visit.  Visit Diagnosis:  Idiopathic scoliosis  Chronic bilateral low back pain without sciatica  Difficulty walking      Subjective Assessment - 01/25/15 1128    Subjective Had medial branch block on Friday. This typicaly helps. tried HEP this morning and was sore in my thighs after. She reports she can stand to do things in a range of 1-2 hours.    Currently in Pain? No/denies   Multiple Pain Sites No                         [facility] Adult PT Treatment/Exercise - 01/25/15 0001    Lumbar Exercises: Aerobic   Stationary Bike L1 x 10 min   Moist Heat Therapy   Number Minutes Moist Heat 15 Minutes   Moist Heat Location Lumbar Spine  sidelying with pillows   Electrical Stimulation   Electrical Stimulation Location Lt lumbar   Electrical Stimulation Action IFC   Electrical Stimulation Goals Pain   Manual Therapy   Manual therapy comments Soft tissue  work Lt lower back in S-L                      PT Long Term Goals - 01/25/15 1134    PT LONG TERM GOAL #1   Title pt will be independent with HEP for continued core strengthening   Time 8   Period Weeks   Status On-going   PT LONG TERM GOAL #3   Title pt will report increased walking tolerance to >86minutes   Time 8   Period Weeks   Status Achieved   PT LONG TERM GOAL #4   Title pt will decrease FOTO to 45% or less   Time 8   Period Weeks   Status On-going  Will do on visit 10               Plan - 01/25/15 1130    Clinical Impression Statement Pt is attempting to be more compliant with her HEP. She finds she is sore but it doesn't seem to increase her LBP. 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 Standing tolerance she reports is much better, able to be up and doing things 1-2 hours.    Pt will benefit from skilled therapeutic intervention in order to improve on  the following deficits Abnormal gait;Decreased mobility;Pain;Hypomobility;Difficulty walking;Postural dysfunction   Rehab Potential Good   Clinical Impairments Affecting Rehab Potential chronic low back pain due to progressive scoliosis, umbar stenosis, and facet and segmental    PT Frequency 2x / week   PT Duration 8 weeks   PT Treatment/Interventions ADLs/Self Care Home Management;Electrical Stimulation;Moist Heat;Therapeutic exercise;Neuromuscular re-education;Patient/family education;Manual techniques;Passive range of motion;Taping   PT Next Visit Plan Add bridge to exercise program, see if PT can tolerate walking on treadmill?   Consulted and Agree with Plan of Care Patient        Problem List Patient Active Problem List   Diagnosis Date Noted  . Disorder of sacroiliac joint 12/22/2014  . Chronic low back pain 12/22/2014  . Spinal stenosis, lumbar region, with neurogenic claudication 10/06/2013  . Acquired scoliosis 11/27/2011  .  Lumbosacral spondylosis without myelopathy 05/22/2011    Danyl Deems, PTA 01/25/2015, 11:35 AM  [facility] [address] [place], [place], [address] Phone: [phone]   Fax:  [phone]  Name: [name] MRN: [id number] Date of Birth: 1945/02/16

## 2015-01-27 ENCOUNTER — Ambulatory Visit
Admission: RE | Admit: 2015-01-27 | Discharge: 2015-01-27 | Disposition: A | Discharge status: Home / Self Care | Payer: Medicare Other | Source: Ambulatory Visit | Attending: Family Medicine | Admitting: Family Medicine

## 2015-01-27 ENCOUNTER — Ambulatory Visit: Payer: Medicare Other | Admitting: Physical Therapy

## 2015-01-27 ENCOUNTER — Encounter: Payer: Self-pay | Admitting: Physical Therapy

## 2015-01-27 DIAGNOSIS — R262 Difficulty in walking, not elsewhere classified: Secondary | ICD-10-CM | POA: Diagnosis not present

## 2015-01-27 DIAGNOSIS — R9389 Abnormal findings on diagnostic imaging of other specified body structures: Secondary | ICD-10-CM

## 2015-01-27 DIAGNOSIS — M545 Low back pain, unspecified: Secondary | ICD-10-CM

## 2015-01-27 DIAGNOSIS — M4120 Other idiopathic scoliosis, site unspecified: Secondary | ICD-10-CM | POA: Diagnosis not present

## 2015-01-27 DIAGNOSIS — G8929 Other chronic pain: Secondary | ICD-10-CM | POA: Diagnosis not present

## 2015-01-27 DIAGNOSIS — R911 Solitary pulmonary nodule: Secondary | ICD-10-CM | POA: Diagnosis not present

## 2015-01-27 NOTE — Patient Instructions (Signed)
Bridge    Lying on back, legs bent 90, feet flat on floor. Press up hips and torso, reaching hands to feet. Hold for _4___ breaths. Do 2x 5-10 reps  Work on symmetry and "finding your glute muscles." Always engage your core  Pelvic Tilt: Posterior - Legs Bent (Supine)    Tighten stomach and flatten back by rolling pelvis down. Hold __5__ seconds. Relax. Repeat 5-10____ times per set. Do __1__ sets per session. Do __2__ sessions per day.  http://orth.exer.us/203   Copyright  VHI. All rights reserved.   Copyright  VHI. All rights reserved.

## 2015-01-27 NOTE — Therapy (Signed)
[facility] [address] [place], [place], [address] Phone: [phone]   Fax:  [phone]  Physical Therapy Treatment  Patient Details  Name: [name] MRN: [id number] Date of Birth: 1945/05/30 Referring Provider: Alysia Penna, MD  Encounter Date: 01/27/2015      PT End of Session - 01/27/15 1119    Visit Number 9   Number of Visits 16   Date for PT Re-Evaluation 02/16/15   PT Start Time 1103   PT Stop Time 1200   PT Time Calculation (min) 57 min   Behavior During Therapy [facility] for tasks assessed/performed      Past Medical History  Diagnosis Date  . Congenital musculoskeletal deformity of spine   . Spinal stenosis, lumbar region, without neurogenic claudication   . Lumbago   . Lumbosacral spondylosis without myelopathy   . Incontinence of bowel 09/2013    Past Surgical History  Procedure Laterality Date  . Foot surgery  07/2011    There were no vitals filed for this visit.  Visit Diagnosis:  Idiopathic scoliosis  Chronic bilateral low back pain without sciatica  Difficulty walking      Subjective Assessment - 01/27/15 1107    Subjective When I stretch too aggressive I hurt. I'm learning how to execute the exercises better.    Currently in Pain? No/denies   Multiple Pain Sites No                         [facility] Adult PT Treatment/Exercise - 01/27/15 0001    Lumbar Exercises: Stretches   Lower Trunk Rotation 3 reps;10 seconds   Pelvic Tilt 5 reps   Piriformis Stretch 3 reps;20 seconds  VC to ease up stretch   Lumbar Exercises: Aerobic   Stationary Bike L2 x 10 min   Lumbar Exercises: Supine   Bridge 10 reps   Moist Heat Therapy   Number Minutes Moist Heat 15 Minutes   Moist Heat Location Lumbar Spine  sidelying with pillows   Electrical Stimulation   Electrical Stimulation Location Lt lumbar   Electrical Stimulation Action IFC   Electrical Stimulation Goals  Pain                PT Education - 01/27/15 1114    Education provided Yes   Education Details HEP bridge and post pelvic tilt   Person(s) Educated Patient   Methods Explanation;Demonstration;Tactile cues;Verbal cues   Comprehension Verbalized understanding;Returned demonstration             PT Long Term Goals - 01/25/15 1134    PT LONG TERM GOAL #1   Title pt will be independent with HEP for continued core strengthening   Time 8   Period Weeks   Status On-going   PT LONG TERM GOAL #3   Title pt will report increased walking tolerance to >69minutes   Time 8   Period Weeks   Status Achieved   PT LONG TERM GOAL #4   Title pt will decrease FOTO to 45% or less   Time 8   Period Weeks   Status On-going  Will do on visit 10               Plan - 01/27/15 1123    Clinical Impression Statement Pt continues to learning how to properly execute her exercises. She so badly wants to exercise "like she used to" and is learning that she cannot. We were able  to add to HEP today that included some lumbar stability exercises.    Pt will benefit from skilled therapeutic intervention in order to improve on the following deficits Abnormal gait;Decreased mobility;Pain;Hypomobility;Difficulty walking;Postural dysfunction   Rehab Potential Good   Clinical Impairments Affecting Rehab Potential chronic low back pain due to progressive scoliosis, umbar stenosis, and facet and segmental    PT Frequency 2x / week   PT Duration 8 weeks   PT Treatment/Interventions ADLs/Self Care Home Management;Electrical Stimulation;Moist Heat;Therapeutic exercise;Neuromuscular re-education;Patient/family education;Manual techniques;Passive range of motion;Taping   PT Next Visit Plan See how she is doing with new exercises.   Consulted and Agree with Plan of Care Patient        Problem List Patient Active Problem List   Diagnosis Date Noted  . Disorder of sacroiliac joint 12/22/2014  .  Chronic low back pain 12/22/2014  . Spinal stenosis, lumbar region, with neurogenic claudication 10/06/2013  . Acquired scoliosis 11/27/2011  . Lumbosacral spondylosis without myelopathy 05/22/2011    COCHRAN,JENNIFER, PTA 01/27/2015, 11:43 AM  [facility] [address], [place], [place], [address] Phone: [phone]   Fax:  [phone]  Name: [name] MRN: [id number] Date of Birth: July 07, 1945

## 2015-02-01 ENCOUNTER — Ambulatory Visit: Payer: Medicare Other | Admitting: Physical Therapy

## 2015-02-03 ENCOUNTER — Encounter: Payer: Self-pay | Admitting: Physical Therapy

## 2015-02-03 ENCOUNTER — Ambulatory Visit: Payer: Medicare Other | Admitting: Physical Therapy

## 2015-02-03 DIAGNOSIS — M545 Low back pain, unspecified: Secondary | ICD-10-CM

## 2015-02-03 DIAGNOSIS — R262 Difficulty in walking, not elsewhere classified: Secondary | ICD-10-CM | POA: Diagnosis not present

## 2015-02-03 DIAGNOSIS — M4120 Other idiopathic scoliosis, site unspecified: Secondary | ICD-10-CM | POA: Diagnosis not present

## 2015-02-03 DIAGNOSIS — G8929 Other chronic pain: Secondary | ICD-10-CM

## 2015-02-03 NOTE — Therapy (Signed)
[name] [address] [place], [place], [address] Phone: [phone]   Fax:  [phone]  Physical Therapy Treatment  Patient Details  Name: [name] MRN: [id number] Date of Birth: [birth year] Referring Provider: Alysia Penna, MD  Encounter Date: 02/03/2015      PT End of Session - 02/03/15 1108    Visit Number 10   Number of Visits 20   Date for PT Re-Evaluation 02/16/15   PT Start Time 1103   PT Stop Time 1200   PT Time Calculation (min) 57 min   Activity Tolerance Patient tolerated treatment well   Behavior During Therapy [facility] for tasks assessed/performed      Past Medical History  Diagnosis Date  . Congenital musculoskeletal deformity of spine   . Spinal stenosis, lumbar region, without neurogenic claudication   . Lumbago   . Lumbosacral spondylosis without myelopathy   . Incontinence of bowel 09/2013    Past Surgical History  Procedure Laterality Date  . Foot surgery  07/2011    There were no vitals filed for this visit.  Visit Diagnosis:  Idiopathic scoliosis  Chronic bilateral low back pain without sciatica  Difficulty walking      Subjective Assessment - 02/03/15 1107    Subjective I was sick the other day, feeling better now. Feel ok since she hasn't really been doig anything but resting.    Currently in Pain? No/denies   Multiple Pain Sites No            [facility] PT Assessment - 02/03/15 0001    Assessment   Medical Diagnosis lumbar pain DDD   Onset Date/Surgical Date 12/09/04   Observation/Other Assessments   Focus on Therapeutic Outcomes (FOTO)  50% limited  CK in all 3 categories   Strength   Overall Strength Comments Hip Bil 5/5, Abd Bil 4+/5, Knee ext RT 5/5, LT 4/5, Ankle 5/5, 4+/5                     [facility] Adult PT Treatment/Exercise - 02/03/15 0001    Lumbar Exercises: Aerobic   Stationary Bike L3 x10 min   UBE (Upper Arm Bike) sitting on  green ball L1 6 min   Moist Heat Therapy   Number Minutes Moist Heat 15 Minutes   Moist Heat Location Lumbar Spine  sidelying with pillows   Electrical Stimulation   Electrical Stimulation Location Lt lumbar   Electrical Stimulation Action IFC   Electrical Stimulation Goals Pain                PT Education - 02/03/15 1146    Education provided Yes   Education Details Review of HEP and gym ideas that would be healthy for her spine. Suggested walking poles.             PT Long Term Goals - 02/03/15 1112    PT LONG TERM GOAL #1   Title pt will be independent with HEP for continued core strengthening   Time 8   Period Weeks   Status On-going   PT LONG TERM GOAL #2   Title pt will demonstrate 4/5 or greater MMT of the LEs   Time 8   Period Weeks   Status Achieved   PT LONG TERM GOAL #3   Title pt will report increased walking tolerance to >34minutes   Time 8   Period Weeks   Status Achieved   PT LONG TERM GOAL #4  Title pt will decrease FOTO to 45% or less   Time 8   Period Weeks   Status On-going  50%               Plan - 02/20/15 1126    Clinical Impression Statement Pt has been sick this week so she hasn't really done much exercise. She absolutely feels better when she does not upholster, so when she finishes her last project she will most llikely continue to feel better. Her LE strength tested pretty good today. She cannot walk long distances very well  with out UE support but she can do 20-30 like a short grocery trip without hurting too bad.     Pt will benefit from skilled therapeutic intervention in order to improve on the following deficits Abnormal gait;Decreased mobility;Pain;Hypomobility;Difficulty walking;Postural dysfunction   Rehab Potential Good   Clinical Impairments Affecting Rehab Potential chronic low back pain due to progressive scoliosis, umbar stenosis, and facet and segmental    PT Frequency 2x / week   PT Duration 8 weeks   PT  Treatment/Interventions ADLs/Self Care Home Management;Electrical Stimulation;Moist Heat;Therapeutic exercise;Neuromuscular re-education;Patient/family education;Manual techniques;Passive range of motion;Taping   PT Next Visit Plan HEP   Consulted and Agree with Plan of Care Patient          G-Codes - 02-20-15 1243    Functional Assessment Tool Used FOTO: 50%    Functional Limitation Mobility: Walking and moving around   Mobility: Walking and Moving Around Current Status [phone]) At least 40 percent but less than 60 percent impaired, limited or restricted   Mobility: Walking and Moving Around Goal Status [id number]) At least 40 percent but less than 60 percent impaired, limited or restricted      Problem List Patient Active Problem List   Diagnosis Date Noted  . Disorder of sacroiliac joint 12/22/2014  . Chronic low back pain 12/22/2014  . Spinal stenosis, lumbar region, with neurogenic claudication 10/06/2013  . Acquired scoliosis 11/27/2011  . Lumbosacral spondylosis without myelopathy 05/22/2011   [name], PTA 02-20-15 1:15 PM   [facility] [address] [place], [place], [address] Phone: [phone]   Fax:  [phone]  Name: [name] MRN: [id number] Date of Birth: 1945-05-22

## 2015-02-03 NOTE — Therapy (Signed)
[facility] [address], [place], [place], [address] Phone: [phone]   Fax:  [phone]  Physical Therapy Treatment  Patient Details  Name: [name] MRN: [id number] Date of Birth: [birth year] Referring Provider: Alysia Penna, MD  Encounter Date: 02/03/2015      PT End of Session - 02/03/15 1108    Visit Number 10   Number of Visits 16   Date for PT Re-Evaluation 02/16/15   PT Start Time 1103   PT Stop Time 1200   PT Time Calculation (min) 57 min   Activity Tolerance Patient tolerated treatment well   Behavior During Therapy [facility] for tasks assessed/performed      Past Medical History  Diagnosis Date  . Congenital musculoskeletal deformity of spine   . Spinal stenosis, lumbar region, without neurogenic claudication   . Lumbago   . Lumbosacral spondylosis without myelopathy   . Incontinence of bowel 09/2013    Past Surgical History  Procedure Laterality Date  . Foot surgery  07/2011    There were no vitals filed for this visit.  Visit Diagnosis:  Idiopathic scoliosis  Chronic bilateral low back pain without sciatica  Difficulty walking      Subjective Assessment - 02/03/15 1107    Subjective I was sick the other day, feeling better now. Feel ok since she hasn't really been doig anything but resting.    Currently in Pain? No/denies   Multiple Pain Sites No            [facility] PT Assessment - 02/03/15 0001    Assessment   Medical Diagnosis lumbar pain DDD   Onset Date/Surgical Date 12/09/04   Observation/Other Assessments   Focus on Therapeutic Outcomes (FOTO)  50% limited  CK in all 3 categories   Strength   Overall Strength Comments Hip Bil 5/5, Abd Bil 4+/5, Knee ext RT 5/5, LT 4/5, Ankle 5/5, 4+/5                     [facility] Adult PT Treatment/Exercise - 02/03/15 0001    Lumbar Exercises: Aerobic   Stationary Bike L3 x10 min   UBE (Upper Arm Bike) sitting on  green ball L1 6 min   Moist Heat Therapy   Number Minutes Moist Heat 15 Minutes   Moist Heat Location Lumbar Spine  sidelying with pillows   Electrical Stimulation   Electrical Stimulation Location Lt lumbar   Electrical Stimulation Action IFC   Electrical Stimulation Goals Pain                PT Education - 02/03/15 1146    Education provided Yes   Education Details Review of HEP and gym ideas that would be healthy for her spine. Suggested walking poles.             PT Long Term Goals - 02/03/15 1112    PT LONG TERM GOAL #1   Title pt will be independent with HEP for continued core strengthening   Time 8   Period Weeks   Status On-going   PT LONG TERM GOAL #2   Title pt will demonstrate 4/5 or greater MMT of the LEs   Time 8   Period Weeks   Status Achieved   PT LONG TERM GOAL #3   Title pt will report increased walking tolerance to >82minutes   Time 8   Period Weeks   Status Achieved   PT LONG TERM GOAL #4  Title pt will decrease FOTO to 45% or less   Time 8   Period Weeks   Status On-going  50%               Plan - 02-15-2015 1126    Clinical Impression Statement Pt has been sick this week so she hasn't really done much exercise. She absolutely feels better when she does not upholster, so when she finishes her last project she will most llikely continue to feel better. Her LE strength tested pretty good today. She cannot walk long distances very well  with out UE support but she can do 20-30 like a short grocery trip without hurting too bad.     Pt will benefit from skilled therapeutic intervention in order to improve on the following deficits Abnormal gait;Decreased mobility;Pain;Hypomobility;Difficulty walking;Postural dysfunction   Rehab Potential Good   Clinical Impairments Affecting Rehab Potential chronic low back pain due to progressive scoliosis, umbar stenosis, and facet and segmental    PT Frequency 2x / week   PT Duration 8 weeks   PT  Treatment/Interventions ADLs/Self Care Home Management;Electrical Stimulation;Moist Heat;Therapeutic exercise;Neuromuscular re-education;Patient/family education;Manual techniques;Passive range of motion;Taping   PT Next Visit Plan HEP   Consulted and Agree with Plan of Care Patient          G-Codes - February 15, 2015 1243    Functional Assessment Tool Used FOTO: 50%    Functional Limitation Mobility: Walking and moving around   Mobility: Walking and Moving Around Current Status [phone]) At least 40 percent but less than 60 percent impaired, limited or restricted   Mobility: Walking and Moving Around Goal Status [id number]) At least 40 percent but less than 60 percent impaired, limited or restricted      Problem List Patient Active Problem List   Diagnosis Date Noted  . Disorder of sacroiliac joint 12/22/2014  . Chronic low back pain 12/22/2014  . Spinal stenosis, lumbar region, with neurogenic claudication 10/06/2013  . Acquired scoliosis 11/27/2011  . Lumbosacral spondylosis without myelopathy 05/22/2011   Physical Therapy Progress Note  Dates of Reporting Period: 12/22/14 to 02/15/2015  Objective Reports of Subjective Statement: See above.  Feeling better.  Has been sick so not able to determine overall progress today.    Objective Measurements: see above for MMT and FOTO score.    Goal Update: see above   Plan: Continue to improve mobility, overall strength and pain management as needed.    Reason Skilled Services are Required: Pt with continued pain and functional LE strength deficits.  Pt will benefit from skilled PT for strength and mobility progression to allow for more tolerance for community and home function.   Sigurd Sos, PT 2015-02-15 12:47 PM   2015/02/15, 12:44 PM  [facility] [address] [place], [place], [address] Phone: [phone]   Fax:  [phone]  Name: [name] MRN: [id number] Date of  Birth: 1945-12-28

## 2015-02-08 ENCOUNTER — Encounter: Payer: Self-pay | Admitting: Physical Therapy

## 2015-02-08 ENCOUNTER — Ambulatory Visit: Payer: Medicare Other | Admitting: Physical Therapy

## 2015-02-08 ENCOUNTER — Ambulatory Visit (HOSPITAL_BASED_OUTPATIENT_CLINIC_OR_DEPARTMENT_OTHER): Payer: Medicare Other | Admitting: Physical Medicine & Rehabilitation

## 2015-02-08 ENCOUNTER — Encounter: Payer: Self-pay | Admitting: Physical Medicine & Rehabilitation

## 2015-02-08 VITALS — BP 135/80 | HR 72

## 2015-02-08 DIAGNOSIS — G8929 Other chronic pain: Secondary | ICD-10-CM

## 2015-02-08 DIAGNOSIS — M545 Low back pain, unspecified: Secondary | ICD-10-CM

## 2015-02-08 DIAGNOSIS — R262 Difficulty in walking, not elsewhere classified: Secondary | ICD-10-CM

## 2015-02-08 DIAGNOSIS — M47817 Spondylosis without myelopathy or radiculopathy, lumbosacral region: Secondary | ICD-10-CM

## 2015-02-08 DIAGNOSIS — M4120 Other idiopathic scoliosis, site unspecified: Secondary | ICD-10-CM

## 2015-02-08 NOTE — Progress Notes (Signed)
Medical acupuncture treatment #2, Treatment time 30 minutes Indication Acute on chronic low back pain  Needles placed bilaterally at BL 21 BL 23 BL 25 BL 27 as well as bilateral GB 30 150 Hz stim between bilateral BL 21 and bilateral BL 23 4 Hz stim bilateral 25 BL 27 and GB 30  Patient tolerated procedure well

## 2015-02-08 NOTE — Patient Instructions (Signed)
Left low back stretches at the kitchen sink:  - Hold onto kitchen sink or other solid piece of furniture. - Walk legs back and put head between arms like you are making a right angle - Walk arms to the right, feeling a stretch along the left side of your low back.  - Relax here and breathe for 10-15 sec, return to middle then repeat 1-2 more times.

## 2015-02-08 NOTE — Therapy (Signed)
[facility] [address], [place], [place], [address] Phone: [phone]   Fax:  [phone]  Physical Therapy Treatment  Patient Details  Name: [name] MRN: [id number] Date of Birth: December 10, 1945 Referring Provider: Alysia Penna, MD  Encounter Date: 02/08/2015      PT End of Session - 02/08/15 1154    Visit Number 11   Number of Visits 20   Date for PT Re-Evaluation 02/16/15   PT Start Time [id number]   PT Stop Time 1245   PT Time Calculation (min) 60 min   Activity Tolerance Patient tolerated treatment well   Behavior During Therapy [facility] for tasks assessed/performed      Past Medical History  Diagnosis Date  . Congenital musculoskeletal deformity of spine   . Spinal stenosis, lumbar region, without neurogenic claudication   . Lumbago   . Lumbosacral spondylosis without myelopathy   . Incontinence of bowel 09/2013    Past Surgical History  Procedure Laterality Date  . Foot surgery  07/2011    There were no vitals filed for this visit.  Visit Diagnosis:  Idiopathic scoliosis  Chronic bilateral low back pain without sciatica  Difficulty walking      Subjective Assessment - 02/08/15 1148    Subjective Just taking a long time to get my chair finished so pt's back gets flared up after working on it.    Currently in Pain? No/denies   Multiple Pain Sites No                         [facility] Adult PT Treatment/Exercise - 02/08/15 0001    Lumbar Exercises: Stretches   Single Knee to Chest Stretch 3 reps;20 seconds  Then knee to opposite shoulder bil 3x20 sec   Lumbar Exercises: Aerobic   Stationary Bike L3 x10 min   UBE (Upper Arm Bike) sitting on green ball L1 6 min   Lumbar Exercises: Seated   Sit to Stand --  seated on green ball: yellow band horizontal abd 3x 10    Other Seated Lumbar Exercises Sidebend stretch bil 3x 10 sec    Other Seated Lumbar Exercises Seated forward   flexion stretch with right rotation, repeat in standing  at kitchen sink.   Pt repeated kitchen  sink stretch 3 x    Moist Heat Therapy   Number Minutes Moist Heat 15 Minutes   Moist Heat Location Lumbar Spine  sidelying with pillows   Electrical Stimulation   Electrical Stimulation Location Lt lumbar   Electrical Stimulation Action IFC   Electrical Stimulation Goals Pain                PT Education - 02/08/15 1208    Education provided Yes   Education Details Lumbar stretches standing   Person(s) Educated Patient   Methods Explanation;Demonstration;Tactile cues;Verbal cues;Handout   Comprehension Verbalized understanding;Returned demonstration             PT Long Term Goals - 02/08/15 1209    PT LONG TERM GOAL #1   Title pt will be independent with HEP for continued core strengthening   Time 8   Period Weeks   Status On-going  Still adding to it.               Plan - 02/08/15 1209    Clinical Impression Statement Continues to work on her chair which is her liimiting factor ulltimately in if she is pain or  not. She has been doing more grocery shopping which she reports continues to be less painful. Added some more stretches to her short side today.    Pt will benefit from skilled therapeutic intervention in order to improve on the following deficits Abnormal gait;Decreased mobility;Pain;Hypomobility;Difficulty walking;Postural dysfunction   Rehab Potential Good   Clinical Impairments Affecting Rehab Potential chronic low back pain due to progressive scoliosis, umbar stenosis, and facet and segmental    PT Frequency 2x / week   PT Duration 8 weeks   PT Treatment/Interventions ADLs/Self Care Home Management;Electrical Stimulation;Moist Heat;Therapeutic exercise;Neuromuscular re-education;Patient/family education;Manual techniques;Passive range of motion;Taping   PT Next Visit Plan Continue to finalize HEP, plan up on 2/7   Consulted and Agree with Plan of  Care Patient        Problem List Patient Active Problem List   Diagnosis Date Noted  . Disorder of sacroiliac joint 12/22/2014  . Chronic low back pain 12/22/2014  . Spinal stenosis, lumbar region, with neurogenic claudication 10/06/2013  . Acquired scoliosis 11/27/2011  . Lumbosacral spondylosis without myelopathy 05/22/2011    Cayleb Jarnigan, PTA 02/08/2015, 12:29 PM  [facility] [address], [place], [place], [address] Phone: [phone]   Fax:  [phone]  Name: RANDEL NETTLE MRN: [id number] Date of Birth: 1945/02/10

## 2015-02-08 NOTE — Patient Instructions (Signed)
Schedule once a week for the next 2 weeks then reassess may need of additional treatments after that

## 2015-02-10 ENCOUNTER — Ambulatory Visit: Payer: Medicare Other | Attending: Physical Medicine & Rehabilitation

## 2015-02-10 DIAGNOSIS — M4120 Other idiopathic scoliosis, site unspecified: Secondary | ICD-10-CM | POA: Diagnosis not present

## 2015-02-10 DIAGNOSIS — M545 Low back pain, unspecified: Secondary | ICD-10-CM

## 2015-02-10 DIAGNOSIS — G8929 Other chronic pain: Secondary | ICD-10-CM | POA: Diagnosis not present

## 2015-02-10 DIAGNOSIS — R262 Difficulty in walking, not elsewhere classified: Secondary | ICD-10-CM

## 2015-02-10 NOTE — Therapy (Signed)
[facility] [address], [place], [place], [address] Phone: [phone]   Fax:  [phone]  Physical Therapy Treatment  Patient Details  Name: [name] MRN: [id number] Date of Birth: October 26, 1945 Referring Provider: Alysia Penna, MD  Encounter Date: 02/10/2015      PT End of Session - 02/10/15 1435    Visit Number 12   Number of Visits 20   Date for PT Re-Evaluation 02/16/15   PT Start Time [id number]   PT Stop Time 1450   PT Time Calculation (min) 51 min   Activity Tolerance Patient limited by pain   Behavior During Therapy [facility] for tasks assessed/performed      Past Medical History  Diagnosis Date  . Congenital musculoskeletal deformity of spine   . Spinal stenosis, lumbar region, without neurogenic claudication   . Lumbago   . Lumbosacral spondylosis without myelopathy   . Incontinence of bowel 09/2013    Past Surgical History  Procedure Laterality Date  . Foot surgery  07/2011    There were no vitals filed for this visit.  Visit Diagnosis:  Idiopathic scoliosis  Chronic bilateral low back pain without sciatica  Difficulty walking      Subjective Assessment - 02/10/15 1357    Subjective Pt reports that she hurts all over today.  Pt thinks that she might have stretched too much last session and has been working to finish working on Emergency planning/management officer and this flares her symptoms.   Currently in Pain? Yes   Pain Score 7    Pain Location Back  entire body   Pain Orientation Right;Left   Pain Descriptors / Indicators Constant;Aching   Pain Type Chronic pain   Aggravating Factors  i over did it   Pain Relieving Factors rest, e-stim and heat                         [facility] Adult PT Treatment/Exercise - 02/10/15 0001    Lumbar Exercises: Stretches   Active Hamstring Stretch 3 reps;20 seconds   Single Knee to Chest Stretch 3 reps;20 seconds  Then knee to opposite shoulder bil  3x20 sec   Lower Trunk Rotation 3 reps;10 seconds   Pelvic Tilt 5 reps   Lumbar Exercises: Aerobic   Stationary Bike Nu Step: Seat 10, arms 10, Level 3   UBE (Upper Arm Bike) sitting on green ball L1 6 min   Lumbar Exercises: Seated   Other Seated Lumbar Exercises seated thoracolumbar rotation 3x20 seconds   Moist Heat Therapy   Number Minutes Moist Heat 15 Minutes   Moist Heat Location Lumbar Spine  sidelying with pillows   Electrical Stimulation   Electrical Stimulation Location Lt lumbar   Electrical Stimulation Action IFC   Electrical Stimulation Parameters 15 minutes   Electrical Stimulation Goals Pain                     PT Long Term Goals - 02/08/15 1209    PT LONG TERM GOAL #1   Title pt will be independent with HEP for continued core strengthening   Time 8   Period Weeks   Status On-going  Still adding to it.               Plan - 02/10/15 1402    Clinical Impression Statement Pt with significant flare-up of pain due to stretching too much and continuing to work on her chair project.  Gentle exercise today and modalities today due to this flare-up  Pt with scoliosis and tightness associated with this.  Pt will continue to benefit from skilled PT for flexibility, strength and modalities PRN.   Pt will benefit from skilled therapeutic intervention in order to improve on the following deficits Abnormal gait;Decreased mobility;Pain;Hypomobility;Difficulty walking;Postural dysfunction   Rehab Potential Good   Clinical Impairments Affecting Rehab Potential chronic low back pain due to progressive scoliosis, umbar stenosis, and facet and segmental    PT Frequency 2x / week   PT Duration 8 weeks   PT Treatment/Interventions ADLs/Self Care Home Management;Electrical Stimulation;Moist Heat;Therapeutic exercise;Neuromuscular re-education;Patient/family education;Manual techniques;Passive range of motion;Taping   PT Next Visit Plan Plan D/C next session to HEP.   use FOTO from visit 10 and D/C FOTO and G-codes   Consulted and Agree with Plan of Care Patient        Problem List Patient Active Problem List   Diagnosis Date Noted  . Disorder of sacroiliac joint 12/22/2014  . Chronic low back pain 12/22/2014  . Spinal stenosis, lumbar region, with neurogenic claudication 10/06/2013  . Acquired scoliosis 11/27/2011  . Lumbosacral spondylosis without myelopathy 05/22/2011    Ellen Mayol, PT 02/10/2015, 2:37 PM  [facility] [address] [place], [place], [address] Phone: [phone]   Fax:  [phone]  Name: [name] MRN: [id number] Date of Birth: Nov 04, 1945

## 2015-02-15 ENCOUNTER — Ambulatory Visit: Payer: Medicare Other

## 2015-02-15 DIAGNOSIS — R262 Difficulty in walking, not elsewhere classified: Secondary | ICD-10-CM | POA: Diagnosis not present

## 2015-02-15 DIAGNOSIS — M4120 Other idiopathic scoliosis, site unspecified: Secondary | ICD-10-CM | POA: Diagnosis not present

## 2015-02-15 DIAGNOSIS — G8929 Other chronic pain: Secondary | ICD-10-CM

## 2015-02-15 DIAGNOSIS — M545 Low back pain, unspecified: Secondary | ICD-10-CM

## 2015-02-15 NOTE — Therapy (Signed)
[facility] [address], [place], [place], [address] Phone: [phone]   Fax:  [phone]  Physical Therapy Treatment  Patient Details  Name: [name] MRN: [id number] Date of Birth: 07-25-1945 Referring Provider: Alysia Penna, MD  Encounter Date: 02/15/2015      PT End of Session - 02/15/15 1216    Visit Number 13   PT Start Time 8099   PT Stop Time 8338   PT Time Calculation (min) 49 min   Activity Tolerance Patient tolerated treatment well   Behavior During Therapy [facility] for tasks assessed/performed      Past Medical History  Diagnosis Date  . Congenital musculoskeletal deformity of spine   . Spinal stenosis, lumbar region, without neurogenic claudication   . Lumbago   . Lumbosacral spondylosis without myelopathy   . Incontinence of bowel 09/2013    Past Surgical History  Procedure Laterality Date  . Foot surgery  07/2011    There were no vitals filed for this visit.  Visit Diagnosis:  Idiopathic scoliosis  Chronic bilateral low back pain without sciatica  Difficulty walking      Subjective Assessment - 02/15/15 1151    Subjective Pt reports that she is feeling overall better with PT.  Ready for D/C.     Currently in Pain? Yes   Pain Score 1    Pain Location Back   Pain Orientation Right;Left   Pain Descriptors / Indicators Aching;Constant   Pain Onset More than a month ago   Pain Frequency Intermittent   Aggravating Factors  overdoing it, end of the day, working on chair   Pain Relieving Factors rest, e-stim and heat            [facility] PT Assessment - 02/15/15 0001    Assessment   Medical Diagnosis lumbar pain DDD   Onset Date/Surgical Date 12/09/04   Observation/Other Assessments   Focus on Therapeutic Outcomes (FOTO)  39% limitation   Strength   Overall Strength Comments Bil extension 4/5, abduction 4+/5, flexion 5/5                     [facility] Adult PT  Treatment/Exercise - 02/15/15 0001    Lumbar Exercises: Stretches   Active Hamstring Stretch 3 reps;20 seconds   Single Knee to Chest Stretch 3 reps;20 seconds  Then knee to opposite shoulder bil 3x20 sec   Lower Trunk Rotation 3 reps;10 seconds   Lumbar Exercises: Aerobic   Stationary Bike Nu Step: Seat 10, arms 10, Level 4 x 10 minutes   UBE (Upper Arm Bike) sitting on green ball L1 6 min   Moist Heat Therapy   Number Minutes Moist Heat 15 Minutes   Moist Heat Location Lumbar Spine  sidelying with pillows   Electrical Stimulation   Electrical Stimulation Location Lt lumbar   Electrical Stimulation Action IFC   Electrical Stimulation Parameters 15 minutes   Electrical Stimulation Goals Pain                     PT Long Term Goals - 02/15/15 1148    PT LONG TERM GOAL #1   Title pt will be independent with HEP for continued core strengthening   Status Achieved   PT LONG TERM GOAL #2   Title pt will demonstrate 4/5 or greater MMT of the LEs   Status Achieved   PT LONG TERM GOAL #3   Title pt will report increased walking  tolerance to >70mnutes   Status Partially Met  10 minutes   PT LONG TERM GOAL #4   Title pt will decrease FOTO to 45% or less   Status Achieved  39% limitation               Plan - 0Mar 03, 2017103-21-07   Clinical Impression Statement Pt reports 65-75% overall improvement in symptoms since the start of care.  Pt has variable pain levels based on time of day and activity level.  She continues to work on her chair project and this flares up her pain . Pt has HEP in place for fleixbility and core strength and will consider pilates after discharge.    PT Next Visit Plan D/C PT to HEP   Consulted and Agree with Plan of Care Patient          G-Codes - 003/03/2017103/21/17   Functional Assessment Tool Used 39% limitation   Functional Limitation Mobility: Walking and moving around   Mobility: Walking and Moving Around Goal Status ([phone] At least 40  percent but less than 60 percent impaired, limited or restricted   Mobility: Walking and Moving Around Discharge Status ([phone] At least 20 percent but less than 40 percent impaired, limited or restricted      Problem List Patient Active Problem List   Diagnosis Date Noted  . Disorder of sacroiliac joint 12/22/2014  . Chronic low back pain 12/22/2014  . Spinal stenosis, lumbar region, with neurogenic claudication 10/06/2013  . Acquired scoliosis 11/27/2011  . Lumbosacral spondylosis without myelopathy 05/22/2011  PHYSICAL THERAPY DISCHARGE SUMMARY  Visits from Start of Care: 13  Current functional level related to goals / functional outcomes: See above for current status.  Pt reports 65-70% overall improvement since the start of care.     Remaining deficits: Continued and chronic LBP and scoliosis.  Pt will continue with HEP and body mechanics modifications to reduce pain.     Education / Equipment: HEP, bEconomistPlan: Patient agrees to discharge.  Patient goals were partially met. Patient is being discharged due to being pleased with the current functional level.  ?????      TAKACS,KELLY 2March 03, 2017 12:18 PM  [facility] [address] [place] [address]Phone: [phone]  Fax:  [phone] Name: Suzanne ARAMBULAMRN: [id number] of Birth: 611-08-1945

## 2015-02-16 ENCOUNTER — Encounter: Payer: Self-pay | Admitting: Physical Medicine & Rehabilitation

## 2015-02-16 ENCOUNTER — Encounter: Payer: Medicare Other | Attending: Physical Medicine & Rehabilitation

## 2015-02-16 ENCOUNTER — Ambulatory Visit (HOSPITAL_BASED_OUTPATIENT_CLINIC_OR_DEPARTMENT_OTHER): Payer: Medicare Other | Admitting: Physical Medicine & Rehabilitation

## 2015-02-16 VITALS — BP 111/73 | HR 68 | Resp 14

## 2015-02-16 DIAGNOSIS — G8929 Other chronic pain: Secondary | ICD-10-CM | POA: Diagnosis not present

## 2015-02-16 DIAGNOSIS — M545 Low back pain, unspecified: Secondary | ICD-10-CM

## 2015-02-16 DIAGNOSIS — M47817 Spondylosis without myelopathy or radiculopathy, lumbosacral region: Secondary | ICD-10-CM | POA: Diagnosis not present

## 2015-02-16 NOTE — Patient Instructions (Signed)
Acupuncture  Acupuncture is a technique that is used in traditional Chinese medical treatment. Traditional Chinese medicine recognizes more than 2,000 points on the body that connect energy pathways (meridians) through the body. Acupuncture stimulates these points with needles that are inserted through your skin. The goal is to balance the physical, emotional, and mental energy in your body.  This treatment is done by a health care provider who has specialized training (licensed acupuncture practitioner). You may have this treatment for many reasons. For instance, many people have acupuncture to treat long-term or short-term pain. Others have acupuncture to treat conditions such as addiction, headaches, and arthritis. Some have it to help them recover from a stroke. Treatment often requires several acupuncture sessions. You may have acupuncture along with other medical treatments.  LET YOUR HEALTH CARE PROVIDER KNOW ABOUT:  · Any allergies you have.  · All medicines you are taking, including vitamins, herbs, eye drops, creams, and over-the-counter medicines.  · Any blood disorders you have.  · Previous surgeries you have had.  · Any medical conditions you may have.  RISKS AND COMPLICATIONS  Generally, this is a safe procedure. However, problems may occur, including:  · Skin infection.  · Damage to organs or structures beneath the skin.  BEFORE THE PROCEDURE  · Your acupuncture practitioner will ask about your medical history and your symptoms.  · You may have a physical exam.  PROCEDURE  · Your skin will be cleaned with a germ-killing (antiseptic) solution.  · Your acupuncture practitioner will open a new set of germ-free (sterile) needles.  · The needles will be inserted in your skin. They will be left in place for a certain length of time. You may feel slight pain or a tingling sensation.  · Your acupuncture practitioner may apply electrical energy to the needles.  · Your acupuncture practitioner may adjust the  needles in certain ways.  · After your procedure, the acupuncture practitioner will remove the needles, throw them away, and clean your skin.  The exact procedure that you have will depend on your condition and how your acupuncture provider treats it. The procedure may vary among health care providers.  AFTER THE PROCEDURE  · Keep all follow-up visits as directed by your health care provider. This is important.  · Let your acupuncture provider know if you have:    Soreness.    Skin redness or irritation.    Fever.     This information is not intended to replace advice given to you by your health care provider. Make sure you discuss any questions you have with your health care provider.     Document Released: 12/29/2002 Document Revised: 05/12/2014 Document Reviewed: 12/17/2013  Elsevier Interactive Patient Education ©2016 [employer]

## 2015-02-16 NOTE — Progress Notes (Addendum)
Medical acupuncture treatment #3 Treatment time 30 minutes Indication Acute on chronic low back pain, history of scoliosis, Some aggravation after doing hip extension exercises during physical therapy yesterday. 50 mm acupuncture needles placed BL 21, BL 23, BL 25, BL 27, BL 29 E-stim 2 Hz between bilateral BL 21 BL 23 BL 25 E-stim 150 Hz bilateral BL 27 BL 29  Patient notes reduction posttreatment for almost 1 week.  Patient tolerated procedure well

## 2015-02-23 ENCOUNTER — Ambulatory Visit (HOSPITAL_BASED_OUTPATIENT_CLINIC_OR_DEPARTMENT_OTHER): Payer: Medicare Other | Admitting: Physical Medicine & Rehabilitation

## 2015-02-23 ENCOUNTER — Encounter: Payer: Self-pay | Admitting: Physical Medicine & Rehabilitation

## 2015-02-23 VITALS — BP 121/65 | HR 85

## 2015-02-23 DIAGNOSIS — M4120 Other idiopathic scoliosis, site unspecified: Secondary | ICD-10-CM

## 2015-02-23 DIAGNOSIS — M419 Scoliosis, unspecified: Secondary | ICD-10-CM

## 2015-02-23 DIAGNOSIS — M533 Sacrococcygeal disorders, not elsewhere classified: Secondary | ICD-10-CM | POA: Diagnosis not present

## 2015-02-23 DIAGNOSIS — M47817 Spondylosis without myelopathy or radiculopathy, lumbosacral region: Secondary | ICD-10-CM

## 2015-02-23 DIAGNOSIS — M48062 Spinal stenosis, lumbar region with neurogenic claudication: Secondary | ICD-10-CM

## 2015-02-23 DIAGNOSIS — M4806 Spinal stenosis, lumbar region: Secondary | ICD-10-CM | POA: Diagnosis not present

## 2015-02-23 DIAGNOSIS — M545 Low back pain, unspecified: Secondary | ICD-10-CM

## 2015-02-23 DIAGNOSIS — G8929 Other chronic pain: Secondary | ICD-10-CM

## 2015-02-23 NOTE — Progress Notes (Signed)
Medical acupuncture treatment #4 Treatment time 30 minutes Indication Acute on chronic low back pain, history of scoliosis, Patient has noted some pain radiating from her buttocks bilaterally to her lateral thigh and lateral knee area. 50 mm acupuncture needles placed Right BL 21, BL 23, BL 25, BL 27, BL 29 Left BL 21 and left BL 29 Bilateral GB 30 Bilateral GB 34, bilateral GB 31  E-stim 2 Hz between bilateral GB 31, GB 34 as well as BL 29 in GB 30 as well as right BL 25 BL 27, bilateral BL 21   Patient notes reduction posttreatment for almost 1 week. We'll schedule additional visits  Patient tolerated procedure well

## 2015-02-23 NOTE — Patient Instructions (Signed)
Acupuncture  Acupuncture is a technique that is used in traditional Chinese medical treatment. Traditional Chinese medicine recognizes more than 2,000 points on the body that connect energy pathways (meridians) through the body. Acupuncture stimulates these points with needles that are inserted through your skin. The goal is to balance the physical, emotional, and mental energy in your body.  This treatment is done by a health care provider who has specialized training (licensed acupuncture practitioner). You may have this treatment for many reasons. For instance, many people have acupuncture to treat long-term or short-term pain. Others have acupuncture to treat conditions such as addiction, headaches, and arthritis. Some have it to help them recover from a stroke. Treatment often requires several acupuncture sessions. You may have acupuncture along with other medical treatments.  LET YOUR HEALTH CARE PROVIDER KNOW ABOUT:  · Any allergies you have.  · All medicines you are taking, including vitamins, herbs, eye drops, creams, and over-the-counter medicines.  · Any blood disorders you have.  · Previous surgeries you have had.  · Any medical conditions you may have.  RISKS AND COMPLICATIONS  Generally, this is a safe procedure. However, problems may occur, including:  · Skin infection.  · Damage to organs or structures beneath the skin.  BEFORE THE PROCEDURE  · Your acupuncture practitioner will ask about your medical history and your symptoms.  · You may have a physical exam.  PROCEDURE  · Your skin will be cleaned with a germ-killing (antiseptic) solution.  · Your acupuncture practitioner will open a new set of germ-free (sterile) needles.  · The needles will be inserted in your skin. They will be left in place for a certain length of time. You may feel slight pain or a tingling sensation.  · Your acupuncture practitioner may apply electrical energy to the needles.  · Your acupuncture practitioner may adjust the  needles in certain ways.  · After your procedure, the acupuncture practitioner will remove the needles, throw them away, and clean your skin.  The exact procedure that you have will depend on your condition and how your acupuncture provider treats it. The procedure may vary among health care providers.  AFTER THE PROCEDURE  · Keep all follow-up visits as directed by your health care provider. This is important.  · Let your acupuncture provider know if you have:    Soreness.    Skin redness or irritation.    Fever.     This information is not intended to replace advice given to you by your health care provider. Make sure you discuss any questions you have with your health care provider.     Document Released: 12/29/2002 Document Revised: 05/12/2014 Document Reviewed: 12/17/2013  Elsevier Interactive Patient Education ©2016 [employer]

## 2015-03-02 ENCOUNTER — Encounter: Payer: Self-pay | Admitting: Physical Medicine & Rehabilitation

## 2015-03-02 ENCOUNTER — Ambulatory Visit (HOSPITAL_BASED_OUTPATIENT_CLINIC_OR_DEPARTMENT_OTHER): Payer: Medicare Other | Admitting: Physical Medicine & Rehabilitation

## 2015-03-02 VITALS — BP 135/75 | HR 75 | Resp 14

## 2015-03-02 DIAGNOSIS — M4120 Other idiopathic scoliosis, site unspecified: Secondary | ICD-10-CM

## 2015-03-02 DIAGNOSIS — M545 Low back pain, unspecified: Secondary | ICD-10-CM

## 2015-03-02 DIAGNOSIS — M419 Scoliosis, unspecified: Secondary | ICD-10-CM

## 2015-03-02 DIAGNOSIS — G8929 Other chronic pain: Secondary | ICD-10-CM

## 2015-03-02 DIAGNOSIS — M47817 Spondylosis without myelopathy or radiculopathy, lumbosacral region: Secondary | ICD-10-CM | POA: Diagnosis not present

## 2015-03-02 NOTE — Patient Instructions (Signed)
Referral made to Dr. Patrice Paradise to evaluate scoliosis

## 2015-03-02 NOTE — Progress Notes (Signed)
Medical acupuncture treatment #5 Treatment time 30 minutes Indication Acute on chronic low back pain, history of scoliosis, Patient has noted some pain radiating from her buttocks bilaterally to her lateral thigh and lateral knee area. 50 mm acupuncture needles placed Left BL 21, BL 23, BL 25, BL 27, BL 50, BL 51, BL 52, BL 53 Left BL 21 and left BL 29 Bilateral GB 30 Bilateral BL 40, BL 61 E-stim 2 Hz between BL 21-BL 50, BL 23-BL 51, BL 25-BL 52, BL 27-BL 53, right BL 40-BL 61   Patient notes reduction posttreatment That varies and if her activity increases with prolonged standing the effects of the treatment subside. We'll schedule additional visits  Patient tolerated procedure well  We discussed other treatment alternatives. She would like a surgical opinion given that she is only getting temporary relief with conservative care. We discussed that her scoliosis would require extensive surgery with instrumentation which she is not interested in. Nevertheless she feels like something more needs to be done. I have recommended obtaining a surgical consultation so she has a better idea of the range of her options. Currently her radicular discomfort is quite intermittent and not the major symptom that she is dealing with which is actually axial back pain left greater than right side  Made a referral to Dr. Patrice Paradise at [facility] sc[facility]osis [facility] At least 15 minutes was spent counseling coordinating care

## 2015-03-08 ENCOUNTER — Encounter: Payer: Self-pay | Admitting: Physical Medicine & Rehabilitation

## 2015-03-08 ENCOUNTER — Ambulatory Visit (HOSPITAL_BASED_OUTPATIENT_CLINIC_OR_DEPARTMENT_OTHER): Payer: Medicare Other | Admitting: Physical Medicine & Rehabilitation

## 2015-03-08 VITALS — BP 127/69 | HR 75 | Resp 14

## 2015-03-08 DIAGNOSIS — M545 Low back pain, unspecified: Secondary | ICD-10-CM

## 2015-03-08 DIAGNOSIS — G8929 Other chronic pain: Secondary | ICD-10-CM | POA: Diagnosis not present

## 2015-03-08 DIAGNOSIS — M47817 Spondylosis without myelopathy or radiculopathy, lumbosacral region: Secondary | ICD-10-CM

## 2015-03-08 DIAGNOSIS — M419 Scoliosis, unspecified: Secondary | ICD-10-CM

## 2015-03-08 NOTE — Patient Instructions (Signed)
Acupuncture  Acupuncture is a technique that is used in traditional Chinese medical treatment. Traditional Chinese medicine recognizes more than 2,000 points on the body that connect energy pathways (meridians) through the body. Acupuncture stimulates these points with needles that are inserted through your skin. The goal is to balance the physical, emotional, and mental energy in your body.  This treatment is done by a health care provider who has specialized training (licensed acupuncture practitioner). You may have this treatment for many reasons. For instance, many people have acupuncture to treat long-term or short-term pain. Others have acupuncture to treat conditions such as addiction, headaches, and arthritis. Some have it to help them recover from a stroke. Treatment often requires several acupuncture sessions. You may have acupuncture along with other medical treatments.  LET YOUR HEALTH CARE PROVIDER KNOW ABOUT:  · Any allergies you have.  · All medicines you are taking, including vitamins, herbs, eye drops, creams, and over-the-counter medicines.  · Any blood disorders you have.  · Previous surgeries you have had.  · Any medical conditions you may have.  RISKS AND COMPLICATIONS  Generally, this is a safe procedure. However, problems may occur, including:  · Skin infection.  · Damage to organs or structures beneath the skin.  BEFORE THE PROCEDURE  · Your acupuncture practitioner will ask about your medical history and your symptoms.  · You may have a physical exam.  PROCEDURE  · Your skin will be cleaned with a germ-killing (antiseptic) solution.  · Your acupuncture practitioner will open a new set of germ-free (sterile) needles.  · The needles will be inserted in your skin. They will be left in place for a certain length of time. You may feel slight pain or a tingling sensation.  · Your acupuncture practitioner may apply electrical energy to the needles.  · Your acupuncture practitioner may adjust the  needles in certain ways.  · After your procedure, the acupuncture practitioner will remove the needles, throw them away, and clean your skin.  The exact procedure that you have will depend on your condition and how your acupuncture provider treats it. The procedure may vary among health care providers.  AFTER THE PROCEDURE  · Keep all follow-up visits as directed by your health care provider. This is important.  · Let your acupuncture provider know if you have:    Soreness.    Skin redness or irritation.    Fever.     This information is not intended to replace advice given to you by your health care provider. Make sure you discuss any questions you have with your health care provider.     Document Released: 12/29/2002 Document Revised: 05/12/2014 Document Reviewed: 12/17/2013  Elsevier Interactive Patient Education ©2016 [employer]

## 2015-03-08 NOTE — Progress Notes (Signed)
Medical acupuncture treatment #6 Treatment time 30 minutes Indication Acute on chronic low back pain, history of scoliosis, Patient has noted some pain radiating from her buttocks bilaterally to her lateral thigh and lateral knee area. 50 mm acupuncture needles placed Left BL 21, BL 23, BL 25, BL 27, BL 50, BL 51, BL 52, BL 53 Left BL 21 and left BL 29 Bilateral GB 30 Bilateral BL 40, BL 61 E-stim 2 Hz between BL 21-BL 50, BL 23-BL 51, BL 25-BL 52, BL 27-BL 53, Bilateral BL 40-BL 61, Bilateral GB 30   Patient notes reduction posttreatment, taking tramadol and aleve, the sciatic pain is relieved ,left lower back mainly Weekly visit x 4 more.    Patient tolerated procedure well

## 2015-03-15 ENCOUNTER — Ambulatory Visit (HOSPITAL_BASED_OUTPATIENT_CLINIC_OR_DEPARTMENT_OTHER): Payer: Medicare Other | Admitting: Physical Medicine & Rehabilitation

## 2015-03-15 ENCOUNTER — Encounter: Payer: Self-pay | Admitting: Physical Medicine & Rehabilitation

## 2015-03-15 ENCOUNTER — Encounter: Payer: Medicare Other | Attending: Physical Medicine & Rehabilitation

## 2015-03-15 VITALS — BP 121/86 | HR 68 | Resp 14

## 2015-03-15 DIAGNOSIS — M545 Low back pain, unspecified: Secondary | ICD-10-CM

## 2015-03-15 DIAGNOSIS — G8929 Other chronic pain: Secondary | ICD-10-CM | POA: Diagnosis not present

## 2015-03-15 DIAGNOSIS — M47817 Spondylosis without myelopathy or radiculopathy, lumbosacral region: Secondary | ICD-10-CM | POA: Insufficient documentation

## 2015-03-15 NOTE — Patient Instructions (Signed)
I do not think you are addicted to tramadol  Charlett Blake M.D. [facility] FAAPM&R (Sports Med, Neuromuscular Med) Diplomate Am Board of Electrodiagnostic Med

## 2015-03-15 NOTE — Progress Notes (Signed)
Medical acupuncture treatment #7 Treatment time 30 minutes Indication Acute on chronic low back pain, history of scoliosis, Patient has noted some pain radiating from her buttocks bilaterally to her lateral thigh and lateral knee area. 50 mm acupuncture needles placed Left BL 21, BL 23, BL 25, BL 27, BL 50, BL 51, BL 52, BL 53 Left BL 21 and left BL 29  Bilateral KI 3, SP-9, BL 61 E-stim 2 Hz between BL 21-BL 50, BL 23-BL 51, BL 25-BL 52, BL 27-BL 53, Bilateral BL 40-BL 61, Bilateral GB 30  Sciatic pain resolving, still has axial pain which increases towards the end of the day Patient notes reduction posttreatment, taking tramadol and aleve, the sciatic pain is relieved ,left lower back mainly Weekly visit x 3 more.    Patient tolerated procedure well

## 2015-03-22 ENCOUNTER — Encounter: Payer: Self-pay | Admitting: Physical Medicine & Rehabilitation

## 2015-03-22 ENCOUNTER — Ambulatory Visit (HOSPITAL_BASED_OUTPATIENT_CLINIC_OR_DEPARTMENT_OTHER): Payer: Medicare Other | Admitting: Physical Medicine & Rehabilitation

## 2015-03-22 VITALS — BP 118/69 | HR 80 | Resp 14

## 2015-03-22 DIAGNOSIS — G8929 Other chronic pain: Secondary | ICD-10-CM | POA: Diagnosis not present

## 2015-03-22 DIAGNOSIS — M545 Low back pain, unspecified: Secondary | ICD-10-CM

## 2015-03-22 DIAGNOSIS — M47817 Spondylosis without myelopathy or radiculopathy, lumbosacral region: Secondary | ICD-10-CM

## 2015-03-22 DIAGNOSIS — M533 Sacrococcygeal disorders, not elsewhere classified: Secondary | ICD-10-CM

## 2015-03-22 NOTE — Progress Notes (Signed)
Medical acupuncture treatment #8 Treatment time 30 minutes Indication Acute on chronic low back pain, history of scoliosis,  50 mm acupuncture needles placed Left  BL 23, BL 25, BL 27, BL 50, BL 51, BL 52, BL 53 Left BL 23 and left BL 29    Sciatic pain resolved still has axial pain which increases towards the end of the day Patient notes reduction posttreatment, taking tramadol and aleve, the sciatic pain is relieved ,left lower back mainly Next visit in 2 wks  Patient tolerated procedure well

## 2015-03-22 NOTE — Patient Instructions (Signed)
Acupuncture  Acupuncture is a technique that is used in traditional Chinese medical treatment. Traditional Chinese medicine recognizes more than 2,000 points on the body that connect energy pathways (meridians) through the body. Acupuncture stimulates these points with needles that are inserted through your skin. The goal is to balance the physical, emotional, and mental energy in your body.  This treatment is done by a health care provider who has specialized training (licensed acupuncture practitioner). You may have this treatment for many reasons. For instance, many people have acupuncture to treat long-term or short-term pain. Others have acupuncture to treat conditions such as addiction, headaches, and arthritis. Some have it to help them recover from a stroke. Treatment often requires several acupuncture sessions. You may have acupuncture along with other medical treatments.  LET YOUR HEALTH CARE PROVIDER KNOW ABOUT:  · Any allergies you have.  · All medicines you are taking, including vitamins, herbs, eye drops, creams, and over-the-counter medicines.  · Any blood disorders you have.  · Previous surgeries you have had.  · Any medical conditions you may have.  RISKS AND COMPLICATIONS  Generally, this is a safe procedure. However, problems may occur, including:  · Skin infection.  · Damage to organs or structures beneath the skin.  BEFORE THE PROCEDURE  · Your acupuncture practitioner will ask about your medical history and your symptoms.  · You may have a physical exam.  PROCEDURE  · Your skin will be cleaned with a germ-killing (antiseptic) solution.  · Your acupuncture practitioner will open a new set of germ-free (sterile) needles.  · The needles will be inserted in your skin. They will be left in place for a certain length of time. You may feel slight pain or a tingling sensation.  · Your acupuncture practitioner may apply electrical energy to the needles.  · Your acupuncture practitioner may adjust the  needles in certain ways.  · After your procedure, the acupuncture practitioner will remove the needles, throw them away, and clean your skin.  The exact procedure that you have will depend on your condition and how your acupuncture provider treats it. The procedure may vary among health care providers.  AFTER THE PROCEDURE  · Keep all follow-up visits as directed by your health care provider. This is important.  · Let your acupuncture provider know if you have:    Soreness.    Skin redness or irritation.    Fever.     This information is not intended to replace advice given to you by your health care provider. Make sure you discuss any questions you have with your health care provider.     Document Released: 12/29/2002 Document Revised: 05/12/2014 Document Reviewed: 12/17/2013  Elsevier Interactive Patient Education ©2016 [employer]

## 2015-04-05 ENCOUNTER — Encounter: Payer: Self-pay | Admitting: Physical Medicine & Rehabilitation

## 2015-04-05 ENCOUNTER — Ambulatory Visit (HOSPITAL_BASED_OUTPATIENT_CLINIC_OR_DEPARTMENT_OTHER): Payer: Medicare Other | Admitting: Physical Medicine & Rehabilitation

## 2015-04-05 VITALS — BP 114/80 | HR 69 | Resp 14

## 2015-04-05 DIAGNOSIS — G8929 Other chronic pain: Secondary | ICD-10-CM | POA: Diagnosis not present

## 2015-04-05 DIAGNOSIS — M545 Low back pain, unspecified: Secondary | ICD-10-CM | POA: Insufficient documentation

## 2015-04-05 DIAGNOSIS — M533 Sacrococcygeal disorders, not elsewhere classified: Secondary | ICD-10-CM

## 2015-04-05 NOTE — Progress Notes (Signed)
Medical acupuncture treatment #9 Pain has moved down toward the sacral area. This is bilateral. Her primarily left-sided pain has improved in the lumbar spine area Bilateral BL 25,26,27 Bilateral GB 30  4 Hz stim for 30 min  Patient tolerated procedure well Return in 2 weeks for repeat procedure

## 2015-04-05 NOTE — Patient Instructions (Signed)
Last sacroiliac injection was in November 2016, both sides were injected

## 2015-04-08 DIAGNOSIS — M4125 Other idiopathic scoliosis, thoracolumbar region: Secondary | ICD-10-CM | POA: Diagnosis not present

## 2015-04-08 DIAGNOSIS — M5416 Radiculopathy, lumbar region: Secondary | ICD-10-CM | POA: Diagnosis not present

## 2015-04-08 DIAGNOSIS — M549 Dorsalgia, unspecified: Secondary | ICD-10-CM | POA: Diagnosis not present

## 2015-04-15 ENCOUNTER — Other Ambulatory Visit: Payer: Self-pay | Admitting: Orthopaedic Surgery

## 2015-04-15 DIAGNOSIS — M5416 Radiculopathy, lumbar region: Secondary | ICD-10-CM

## 2015-04-21 ENCOUNTER — Ambulatory Visit
Admission: RE | Admit: 2015-04-21 | Discharge: 2015-04-21 | Disposition: A | Discharge status: Home / Self Care | Payer: Medicare Other | Source: Ambulatory Visit | Attending: Orthopaedic Surgery | Admitting: Orthopaedic Surgery

## 2015-04-21 DIAGNOSIS — M4806 Spinal stenosis, lumbar region: Secondary | ICD-10-CM | POA: Diagnosis not present

## 2015-04-21 DIAGNOSIS — M5416 Radiculopathy, lumbar region: Secondary | ICD-10-CM

## 2015-04-26 ENCOUNTER — Encounter: Payer: Self-pay | Admitting: Physical Medicine & Rehabilitation

## 2015-04-26 ENCOUNTER — Ambulatory Visit (HOSPITAL_BASED_OUTPATIENT_CLINIC_OR_DEPARTMENT_OTHER): Payer: Medicare Other | Admitting: Physical Medicine & Rehabilitation

## 2015-04-26 ENCOUNTER — Encounter: Payer: Medicare Other | Attending: Physical Medicine & Rehabilitation

## 2015-04-26 VITALS — BP 112/59 | HR 82 | Resp 14

## 2015-04-26 DIAGNOSIS — G8929 Other chronic pain: Secondary | ICD-10-CM

## 2015-04-26 DIAGNOSIS — M545 Low back pain, unspecified: Secondary | ICD-10-CM

## 2015-04-26 DIAGNOSIS — M47817 Spondylosis without myelopathy or radiculopathy, lumbosacral region: Secondary | ICD-10-CM | POA: Diagnosis present

## 2015-04-26 DIAGNOSIS — M4120 Other idiopathic scoliosis, site unspecified: Secondary | ICD-10-CM

## 2015-04-26 DIAGNOSIS — M419 Scoliosis, unspecified: Secondary | ICD-10-CM

## 2015-04-26 NOTE — Patient Instructions (Signed)
This is  10th acupuncture treatment If you wish to continue these we can give you a coupon for free visits at another acupuncture clinic

## 2015-04-26 NOTE — Progress Notes (Signed)
Acupuncture treatment #10 Indication: Chronic low back pain with lumbar spondylosis and scoliosis Patient has had improvement in her lumbosacral pain but has had some increase in the left-sided lumbar paraspinal pain. Needles placed at bilateral ADL 21 and bilateral BL 27 as well as left EL 23 and BL 25. In addition he feels placed at BL 50, BL 51, BL 52, BL 53 on the left Electrostimulation 4 Hz , 30 minutes  Patient tolerated procedure well Patient will [facility] and will make another appointment for follow-up. Coupons for free acupuncture at outside clinic

## 2015-05-05 DIAGNOSIS — Z6831 Body mass index (BMI) 31.0-31.9, adult: Secondary | ICD-10-CM | POA: Diagnosis not present

## 2015-05-05 DIAGNOSIS — M5416 Radiculopathy, lumbar region: Secondary | ICD-10-CM | POA: Diagnosis not present

## 2015-05-05 DIAGNOSIS — Z4689 Encounter for fitting and adjustment of other specified devices: Secondary | ICD-10-CM | POA: Diagnosis not present

## 2015-05-05 DIAGNOSIS — M4125 Other idiopathic scoliosis, thoracolumbar region: Secondary | ICD-10-CM | POA: Diagnosis not present

## 2015-05-14 DIAGNOSIS — F4321 Adjustment disorder with depressed mood: Secondary | ICD-10-CM | POA: Diagnosis not present

## 2015-05-21 ENCOUNTER — Ambulatory Visit (HOSPITAL_BASED_OUTPATIENT_CLINIC_OR_DEPARTMENT_OTHER): Payer: Medicare Other | Admitting: Physical Medicine & Rehabilitation

## 2015-05-21 ENCOUNTER — Encounter: Payer: Self-pay | Admitting: Physical Medicine & Rehabilitation

## 2015-05-21 ENCOUNTER — Encounter: Payer: Medicare Other | Attending: Physical Medicine & Rehabilitation

## 2015-05-21 VITALS — BP 120/73 | HR 82 | Resp 15

## 2015-05-21 DIAGNOSIS — Z79899 Other long term (current) drug therapy: Secondary | ICD-10-CM | POA: Diagnosis not present

## 2015-05-21 DIAGNOSIS — G894 Chronic pain syndrome: Secondary | ICD-10-CM | POA: Diagnosis not present

## 2015-05-21 DIAGNOSIS — Z5181 Encounter for therapeutic drug level monitoring: Secondary | ICD-10-CM

## 2015-05-21 DIAGNOSIS — M47817 Spondylosis without myelopathy or radiculopathy, lumbosacral region: Secondary | ICD-10-CM | POA: Insufficient documentation

## 2015-05-21 MED ORDER — BUPRENORPHINE 5 MCG/HR TD PTWK: 5.0000 ug | MEDICATED_PATCH | TRANSDERMAL | Status: DC

## 2015-05-21 NOTE — Patient Instructions (Signed)
RF next time

## 2015-05-21 NOTE — Progress Notes (Signed)
Subjective:    Patient ID: [name], female    DOB: 08/22/1945, 70 y.o.   MRN: [id number]  HPI   Pain Inventory Average Pain 8 Pain Right Now 4 My pain is sharp and burning  In the last 24 hours, has pain interfered with the following? General activity 9 Relation with others 9 Enjoyment of life 9 What TIME of day is your pain at its worst? Daytime and Evening Sleep (in general) Fair  Pain is worse with: walking, standing and some activites Pain improves with: rest, medication and injections Relief from Meds: 5  Mobility how many minutes can you walk? 4-5 ability to climb steps?  yes do you drive?  yes  Function retired I need assistance with the following:  meal prep and household duties  Neuro/Psych No problems in this area  Prior Studies Any changes since last visit?  yes MRI LUMBAR SPINE WITHOUT CONTRAST  TECHNIQUE: Multiplanar, multisequence MR imaging of the lumbar spine was performed. No intravenous contrast was administered.  COMPARISON: 09/23/2013.  FINDINGS: Moderate dextroscoliosis lumbar spine. Disc space narrowing endplate reactive changes greatest on the left at the L3-4 level.  Slightly tortuous common iliac arteries. No worrisome paravertebral abnormality.  Conus L1 level.  Various degrees of spinal stenosis and foraminal narrowing lower thoracic region incompletely assessed on present exam (axial imaging not performed). If further delineation is clinically desired, thoracic spine MR can be obtained for further delineation.  L1-2: Facet degenerative changes and bony overgrowth greater on left. Mild anterior slip, rotation and bulge. Multifactorial moderate spinal stenosis greater on the left. Mild right-sided and moderate left-sided foraminal narrowing.  L2-3: Facet degenerative changes with bony overgrowth greater on the right. Bulge and mild rotation. Multifactorial mild spinal stenosis. Mild to moderate right-sided  and mild left-sided foraminal narrowing.  L3-4: Facet bony overgrowth greater on the right. Ligamentum flavum hypertrophy. Grade 1 anterior spondylolisthesis with 5.8 mm anterior slip. Bulge. Multifactorial mild to moderate spinal stenosis. Mild foraminal narrowing slightly greater on the left.  L4-5: Facet bony overgrowth. 5.8 mm anterior slip of L4. Bulge greater to the right. Bulge/protrusion with extension into the inferior aspect of the neural foramen with right annular fissure. Multifactorial moderate to marked spinal stenosis and right greater than left lateral recess stenosis. Moderate to marked right-sided foraminal narrowing and encroachment upon the exiting right L4 nerve root. Mild left foraminal narrowing.  L5-S1: Facet degenerative changes. Bulge. Moderate left-sided and mild right-sided lateral recess stenosis. Mild spinal stenosis. Mild to moderate left-sided foraminal narrowing.  IMPRESSION: Progressive degenerative changes as detailed above. Summary of pertinent findings includes:  L1-2 multifactorial moderate spinal stenosis greater on the left. Mild right-sided and moderate left-sided foraminal narrowing.  L2-3 multifactorial mild spinal stenosis. Mild to moderate right-sided and mild left-sided foraminal narrowing.  L3-4 multifactorial mild to moderate spinal stenosis. Mild foraminal narrowing slightly greater on the left.  L4-5 multifactorial moderate to marked spinal stenosis and right greater than left lateral recess stenosis. Moderate to marked right-sided foraminal narrowing and encroachment upon the exiting right L4 nerve root. Mild left foraminal narrowing.  L5-S1 multifactorial moderate left-sided and mild right-sided lateral recess stenosis. Mild spinal stenosis. Mild to moderate left-sided foraminal narrowing.   Electronically Signed  By: Genia Del M.D.  On: 04/22/2015 07:14       Physicians involved in your care Any  changes since last visit?  no Dr. Rennis Harding, orthopedic spine surgeon, visit on 05/05/2015, thoracic and lumbar spine films done to assess scoliosis  curvature in his office.  Family History  Problem Relation Age of Onset  . Dementia Mother   . Stroke Mother   . Depression Mother   . COPD Father   . Cancer Brother    Social History   Social History  . Marital Status: Married    Spouse Name: N/A  . Number of Children: N/A  . Years of Education: N/A   Social History Main Topics  . Smoking status: Never Smoker   . Smokeless tobacco: Never Used  . Alcohol Use: None  . Drug Use: None  . Sexual Activity: Not Asked   Other Topics Concern  . None   Social History Narrative   Past Surgical History  Procedure Laterality Date  . Foot surgery  07/2011   Past Medical History  Diagnosis Date  . Congenital musculoskeletal deformity of spine   . Spinal stenosis, lumbar region, without neurogenic claudication   . Lumbago   . Lumbosacral spondylosis without myelopathy   . Incontinence of bowel 09/2013   BP 120/73 mmHg  Pulse 82  Resp 15  SpO2 95%  Opioid Risk Score:   Fall Risk Score:  `1  Depression screen PHQ 2/9  Depression screen [facility] 2/9 02/23/2015 02/16/2015 02/08/2015 01/22/2015  Decreased Interest 0 0 1 -  Down, Depressed, Hopeless 0 0 1 1  PHQ - 2 Score 0 0 2 1  Altered sleeping 0 2 - 1  Tired, decreased energy 0 2 - 1  Change in appetite 0 0 - 0  Feeling bad or failure about yourself  0 0 - 0  Trouble concentrating 0 0 - 1  Moving slowly or fidgety/restless 0 0 - 0  Suicidal thoughts 0 0 - 0  PHQ-9 Score 0 4 - 4  Difficult doing work/chores - Very difficult - Somewhat difficult       Review of Systems  All other systems reviewed and are negative.      Objective:   Physical Exam  Constitutional: She is oriented to person, place, and time. She appears well-developed and well-nourished.  HENT:  Head: Normocephalic and atraumatic.  Eyes: Conjunctivae and  EOM are normal. Pupils are equal, round, and reactive to light.  Neck: Normal range of motion.  Musculoskeletal:       Right knee: Normal. She exhibits normal range of motion, no swelling, no effusion, no deformity, no LCL laxity and no MCL laxity. No medial joint line, no lateral joint line and no patellar tendon tenderness noted.       Left knee: She exhibits normal range of motion, no swelling, no effusion, normal alignment, no LCL laxity and no MCL laxity. No medial joint line, no lateral joint line and no patellar tendon tenderness noted.       Lumbar back: She exhibits decreased range of motion, tenderness and deformity.  Dextroconvex thoracic or lumbar scoliosis with rotatory component  Pelvic obliquity with left iliac crest writing higher than right and compensatory right knee flexion during stance phase  Ambulates without evidence of toe drag or knee instability.  Bilateral knees show no evidence of effusion no tenderness for range of motion  Patient has 0-25% lumbar flexion and extension lateral bending and rotation.  Neurological: She is alert and oriented to person, place, and time. She displays no atrophy. Gait abnormal.  Reflex Scores:      Patellar reflexes are 1+ on the right side and 1+ on the left side.      Achilles reflexes are 1+ on  the right side and 1+ on the left side. Sensation intact to light touch in bilateral lower limbs no evidence of intrinsic atrophy in the feet  Motor strength is 5/5 bilateral hip flexors knee extensors ankle dorsi flexors and plantar flexors  Forward flexed gait  Psychiatric: She has a normal mood and affect.  Nursing note and vitals reviewed.         Assessment & Plan:  1. Thoracolumbar scoliosis as well as lumbar spinal stenosis. She has a combination of axial back pain worse on the left side which is related to lumbar spondylosis. She got partial relief with lumbar medial branch block for about a month at L3 L4 L5, the  radiofrequency procedure in October did not give a for very long response. Recommend doing an additional level i.e. L2-L3 L4-L5  2. Bilateral knee pain no evidence of knee pathology on examination. I believe this is radicular, given her MRI findings, suspect bilateral L4 nerve root impingement We discussed options. She is on a high-dose of gabapentin at night. We could potentially switch her to her Lyrica 200 mg at night. Another treatment option includes lumbar epidural injection, this could be either bilateral L4 transforaminal or a midline trans-laminar injection under fluoroscopic guidance  3. Chronic pain syndrome multifactorial primarily spine related, tramadol no longer effective. We will trial buprenorphine patch 5 g per hour change every week. 2 week supply. May need to titrate upward. Check urine drug screen. Explained that this is a schedule III controlled substance. We discussed risk and benefits of the patch versus Tylenol with codeine. We'll need every three-month follow-ups  Treatment options discussed 1. Surgery as discussed with Dr. Rennis Harding, lumbar fusion. We did discuss potential adjacent level degeneration over time  2. Physical therapy this has been trialed already, patient has maintained her home exercise program and has been quite compliant with exercise in general  3. Injections these are outlined above, to broad categories, axial back pain as well as radicular pain  4. Medication management as outlined above, gabapentin for radicular pain and opioid for axial pain  5. Alternative treatments has had some partial benefit with acupuncture, other treatment options include bio wave percutaneous electrical nerve stimulation

## 2015-05-24 DIAGNOSIS — E559 Vitamin D deficiency, unspecified: Secondary | ICD-10-CM | POA: Diagnosis not present

## 2015-05-24 DIAGNOSIS — I10 Essential (primary) hypertension: Secondary | ICD-10-CM | POA: Diagnosis not present

## 2015-05-24 DIAGNOSIS — E669 Obesity, unspecified: Secondary | ICD-10-CM | POA: Diagnosis not present

## 2015-05-24 DIAGNOSIS — M4125 Other idiopathic scoliosis, thoracolumbar region: Secondary | ICD-10-CM | POA: Diagnosis not present

## 2015-05-24 DIAGNOSIS — E7800 Pure hypercholesterolemia, unspecified: Secondary | ICD-10-CM | POA: Diagnosis not present

## 2015-05-27 ENCOUNTER — Telehealth: Payer: Self-pay | Admitting: *Deleted

## 2015-05-27 MED ORDER — ACETAMINOPHEN-CODEINE #3 300-30 MG PO TABS: 1.0000 | ORAL_TABLET | Freq: Three times a day (TID) | ORAL | Status: DC | PRN

## 2015-05-27 NOTE — Telephone Encounter (Signed)
Pt went to pharmacy to fill her Rx  For the [place]rans trial, insurance will not cover, out of pocket would be $120.00.  She is asking if you would write a script for tylenol with codeine....please advise

## 2015-05-27 NOTE — Telephone Encounter (Signed)
Spoke with pt. Pt. Advised that I have called in medication to the pharmacy.

## 2015-05-27 NOTE — Telephone Encounter (Signed)
Please call and Tylenol No. 3, 1 tablet every 8 hours as needed #90, 2 refill

## 2015-05-30 LAB — TOXASSURE SELECT,+ANTIDEPR,UR

## 2015-06-02 DIAGNOSIS — M4155 Other secondary scoliosis, thoracolumbar region: Secondary | ICD-10-CM | POA: Diagnosis not present

## 2015-06-02 DIAGNOSIS — Z6832 Body mass index (BMI) 32.0-32.9, adult: Secondary | ICD-10-CM | POA: Diagnosis not present

## 2015-06-04 ENCOUNTER — Ambulatory Visit: Payer: Medicare Other | Admitting: Physical Medicine & Rehabilitation

## 2015-06-04 ENCOUNTER — Ambulatory Visit (HOSPITAL_BASED_OUTPATIENT_CLINIC_OR_DEPARTMENT_OTHER): Payer: Medicare Other | Admitting: Physical Medicine & Rehabilitation

## 2015-06-04 ENCOUNTER — Encounter: Payer: Self-pay | Admitting: Physical Medicine & Rehabilitation

## 2015-06-04 VITALS — BP 138/94 | HR 73 | Resp 15

## 2015-06-04 DIAGNOSIS — M47817 Spondylosis without myelopathy or radiculopathy, lumbosacral region: Secondary | ICD-10-CM | POA: Diagnosis not present

## 2015-06-04 NOTE — Patient Instructions (Signed)

## 2015-06-04 NOTE — Progress Notes (Signed)
Left L5 dorsal ramus., left L4 and left L3 medial branch radio frequency ablation under fluoroscopic guidance  Indication: Low back pain due to lumbar spondylosis which has been relieved on 2 occasions by greater than 50% by lumbar medial branch blocks at corresponding levels.  Informed consent was obtained after describing risks and benefits of the procedure with the patient, this includes bleeding, bruising, infection, paralysis and medication side effects. The patient wishes to proceed and has given written consent. The patient was placed in a prone position. The lumbar and sacral area was marked and prepped with Betadine. A 25-gauge 1-1/2 inch needle was inserted into the skin and subcutaneous tissue at 3 sites in one ML of 1% lidocaine was injected into each site. Then a 20-gauge 10 cm radio frequency needle with a 1 cm curved active tip was inserted targeting the left S1 SAP/sacral ala junction. Bone contact was made and confirmed with lateral imaging. Sensory stimulation at 50 Hz followed by motor stimulation at 2 Hz confirm proper needle location followed by injection of one ML of the solution containing one ML of 4 mg per mL dexamethasone and 3 mL of 1% MPF lidocaine. Then the left L5 SAP/transverse process junction was targeted. Bone contact was made and confirmed with lateral imaging. Sensory stimulation at 50 Hz followed by motor stimulation at 2 Hz confirm proper needle location followed by injection of one ML of the solution containing one ML of 4 mg per mL dexamethasone and 3 mL of 1% MPF lidocaine. Then the left L4 SAP/transverse process junction was targeted. Bone contact was made and confirmed with lateral imaging. Sensory stimulation at 50 Hz followed by motor stimulation at 2 Hz confirm proper needle location followed by injection of one ML of the solution containing one ML of 4 mg per mL dexamethasone and 3 mL of 1% MPF lidocaine. Radio frequency lesion being at [facility] for 90 seconds was  performed. Needles were removed. Post procedure instructions and vital signs were performed. Patient tolerated procedure well. Followup appointment was given.

## 2015-06-04 NOTE — Progress Notes (Signed)
  [facility]   Name: [name] DOB:November 09, 1945 MRN: [id number]  Date:06/04/2015  Physician: Alysia Penna, MD    Nurse/CMA: Gara Kroner, CMA  Allergies: No Known Allergies  Consent Signed: Yes.    Is patient diabetic? No.  CBG today? NA  Pregnant: No. LMP: No LMP recorded. Patient is postmenopausal. (age 68-55)  Anticoagulants: no Anti-inflammatory: no Antibiotics: no  Procedure: Left Radiofrequency Neurotomy  Position: Prone Start Time: 1010 End Time: 1038 Fluoro Time: 27  RN/CMA Wood, CMA Wood, CMA    Time 0945 1043    BP 138/94 120/83    Pulse 73 69    Respirations 15 15    O2 Sat 96 96    S/S 6 6    Pain Level 5/10 0/10     D/C home with Husband, patient A & O X 3, D/C instructions reviewed, and sits independently.

## 2015-06-23 DIAGNOSIS — M5416 Radiculopathy, lumbar region: Secondary | ICD-10-CM | POA: Diagnosis not present

## 2015-06-23 DIAGNOSIS — M545 Low back pain: Secondary | ICD-10-CM | POA: Diagnosis not present

## 2015-06-23 DIAGNOSIS — M4125 Other idiopathic scoliosis, thoracolumbar region: Secondary | ICD-10-CM | POA: Diagnosis not present

## 2015-07-02 ENCOUNTER — Encounter: Payer: Medicare Other | Attending: Physical Medicine & Rehabilitation

## 2015-07-02 ENCOUNTER — Encounter: Payer: Self-pay | Admitting: Physical Medicine & Rehabilitation

## 2015-07-02 ENCOUNTER — Ambulatory Visit (HOSPITAL_BASED_OUTPATIENT_CLINIC_OR_DEPARTMENT_OTHER): Payer: Medicare Other | Admitting: Physical Medicine & Rehabilitation

## 2015-07-02 VITALS — BP 129/87 | HR 73

## 2015-07-02 DIAGNOSIS — M4806 Spinal stenosis, lumbar region: Secondary | ICD-10-CM | POA: Diagnosis not present

## 2015-07-02 DIAGNOSIS — M4126 Other idiopathic scoliosis, lumbar region: Secondary | ICD-10-CM

## 2015-07-02 DIAGNOSIS — M47817 Spondylosis without myelopathy or radiculopathy, lumbosacral region: Secondary | ICD-10-CM

## 2015-07-02 DIAGNOSIS — M533 Sacrococcygeal disorders, not elsewhere classified: Secondary | ICD-10-CM | POA: Diagnosis not present

## 2015-07-02 DIAGNOSIS — M419 Scoliosis, unspecified: Secondary | ICD-10-CM

## 2015-07-02 DIAGNOSIS — M545 Low back pain, unspecified: Secondary | ICD-10-CM

## 2015-07-02 DIAGNOSIS — G8929 Other chronic pain: Secondary | ICD-10-CM

## 2015-07-02 DIAGNOSIS — M48062 Spinal stenosis, lumbar region with neurogenic claudication: Secondary | ICD-10-CM

## 2015-07-02 NOTE — Patient Instructions (Addendum)
TENS-   Right heel lift

## 2015-07-02 NOTE — Progress Notes (Signed)
Subjective:    Patient ID: [name], female    DOB: 1945-03-26, 70 y.o.   MRN: [id number]  HPI Left L3,4,5  RFA  5/26 with improvement , Still has some residual pain in the left PSIS area. Notices more pain on the right side now. Had a fall 6/13 which aggraved low back pain   Sees orthopedics for scoliosis, repeated xray after fall no new issues.  Patient has a follow-up appointment with orthopedic surgery  Pain Inventory Average Pain 8 Pain Right Now 9 My pain is constant, sharp and burning  In the last 24 hours, has pain interfered with the following? General activity 8 Relation with others 8 Enjoyment of life 8 What TIME of day is your pain at its worst? all times Sleep (in general) Poor  Pain is worse with: walking and standing Pain improves with: rest, heat/ice and injections Relief from Meds: 6  Mobility walk without assistance how many minutes can you walk? 3-5 ability to climb steps?  yes do you drive?  yes  Function retired I need assistance with the following:  bathing and household duties  Neuro/Psych weakness dizziness  Prior Studies Any changes since last visit?  yes  Physicians involved in your care .   Family History  Problem Relation Age of Onset  . Dementia Mother   . Stroke Mother   . Depression Mother   . COPD Father   . Cancer Brother    Social History   Social History  . Marital Status: Married    Spouse Name: N/A  . Number of Children: N/A  . Years of Education: N/A   Social History Main Topics  . Smoking status: Never Smoker   . Smokeless tobacco: Never Used  . Alcohol Use: None  . Drug Use: None  . Sexual Activity: Not Asked   Other Topics Concern  . None   Social History Narrative   Past Surgical History  Procedure Laterality Date  . Foot surgery  07/2011   Past Medical History  Diagnosis Date  . Congenital musculoskeletal deformity of spine   . Spinal stenosis, lumbar region, without neurogenic  claudication   . Lumbago   . Lumbosacral spondylosis without myelopathy   . Incontinence of bowel 09/2013   BP 129/87 mmHg  Pulse 73  SpO2 94%  Opioid Risk Score:   Fall Risk Score:  `1  Depression screen PHQ 2/9  Depression screen [facility] 2/9 02/23/2015 02/16/2015 02/08/2015 01/22/2015  Decreased Interest 0 0 1 -  Down, Depressed, Hopeless 0 0 1 1  PHQ - 2 Score 0 0 2 1  Altered sleeping 0 2 - 1  Tired, decreased energy 0 2 - 1  Change in appetite 0 0 - 0  Feeling bad or failure about yourself  0 0 - 0  Trouble concentrating 0 0 - 1  Moving slowly or fidgety/restless 0 0 - 0  Suicidal thoughts 0 0 - 0  PHQ-9 Score 0 4 - 4  Difficult doing work/chores - Very difficult - Somewhat difficult     Review of Systems     Objective:   Physical Exam  Musculoskeletal:       Arms:   Pelvic obliquity with left iliac crest writing higher than the right iliac crest Decreased pp R L3 Dextroscoliosis thoracolumbar Motor strength is 5/5 bilateral hip flexor knee extensor and ankle dorsiflexor. Deep tendon reflexes are 1+ bilateral patellar and Achilles. Right heel Achilles tendon repair scar    Assessment &  Plan:  1. Lumbar scoliosis as well as lumbar spinal stenosis. She has  Clinical evidence of right L3 sensory radiculopathy but no motor symptoms at this point. Patient will follow up with orthopedic spine surgery, Considering thoracolumbar fusion T 10 to sacrum  Patient did not have very much pain relief with Tylenol No. 3 or with tramadol.Already failed over-the-counter analgesics  We discussed other conservative treatment options including percutaneous electrical nerve stimulation. She's had some partial relief with TENS unit. Failure of recent physical therapy as well as medication management.  2. Spondylosis lumbar mainly left sided however after left L3 L4 L5 RFA has been noticing right sided lumbar pain. Recommend right-sided L3 L4 L5 medial branch blocks   We discussed at  some length her treatment options, she is apprehensive about surgery but also is frustrated about the failure of other treatments that she has are retried. Over half of the 25 min visit was spent counseling and coordinating care.

## 2015-07-07 ENCOUNTER — Encounter: Payer: Self-pay | Admitting: *Deleted

## 2015-07-27 ENCOUNTER — Telehealth: Payer: Self-pay | Admitting: Physical Medicine & Rehabilitation

## 2015-07-27 NOTE — Telephone Encounter (Signed)
Patient is due to have a MBB this coming Friday and would like to know if she can have it switched to a left RF.  Left side is bothering her more than right side.  Please call patient at [phone].

## 2015-07-27 NOTE — Telephone Encounter (Signed)
Please advise on injection?

## 2015-07-27 NOTE — Telephone Encounter (Signed)
too Early for left repeat RF L3-L4 5, may do left medial branch blocks L3, L4, L5

## 2015-07-28 DIAGNOSIS — M5416 Radiculopathy, lumbar region: Secondary | ICD-10-CM | POA: Diagnosis not present

## 2015-07-28 DIAGNOSIS — M4125 Other idiopathic scoliosis, thoracolumbar region: Secondary | ICD-10-CM | POA: Diagnosis not present

## 2015-07-28 NOTE — Telephone Encounter (Signed)
Called patient and informed. Patient acknowledged

## 2015-07-29 ENCOUNTER — Other Ambulatory Visit: Payer: Self-pay | Admitting: Family Medicine

## 2015-07-29 DIAGNOSIS — Z1231 Encounter for screening mammogram for malignant neoplasm of breast: Secondary | ICD-10-CM

## 2015-07-30 ENCOUNTER — Encounter: Payer: Self-pay | Admitting: Physical Medicine & Rehabilitation

## 2015-07-30 ENCOUNTER — Encounter: Payer: Medicare Other | Attending: Physical Medicine & Rehabilitation

## 2015-07-30 ENCOUNTER — Ambulatory Visit (HOSPITAL_BASED_OUTPATIENT_CLINIC_OR_DEPARTMENT_OTHER): Payer: Medicare Other | Admitting: Physical Medicine & Rehabilitation

## 2015-07-30 VITALS — BP 115/76 | HR 66 | Resp 14

## 2015-07-30 DIAGNOSIS — M47817 Spondylosis without myelopathy or radiculopathy, lumbosacral region: Secondary | ICD-10-CM | POA: Insufficient documentation

## 2015-07-30 DIAGNOSIS — M533 Sacrococcygeal disorders, not elsewhere classified: Secondary | ICD-10-CM

## 2015-07-30 NOTE — Patient Instructions (Signed)
Sacroiliac injection was performed today. A combination of a naming medicine plus a cortisone medicine was injected. The injection was done under x-ray guidance. This procedure has been performed to help reduce low back and buttocks pain as well as potentially hip pain. The duration of this injection is variable lasting from hours to  Months. It may repeated if needed. 

## 2015-07-30 NOTE — Progress Notes (Signed)

## 2015-07-30 NOTE — Progress Notes (Signed)
  [facility]   Name: [name] DOB:1945/10/24 MRN: [id number]  Date:07/30/2015  Physician: Alysia Penna, MD    Nurse/CMA: Clydene Laming, CMA  Allergies: No Known Allergies  Consent Signed: Yes.    Is patient diabetic? No.  CBG today? n/a  Pregnant: No. LMP: No LMP recorded. Patient is postmenopausal. (age 68-55)  Anticoagulants: no Anti-inflammatory: no Antibiotics: no  Procedure: left sacroiliac steroid injection Position: Prone Start Time: 0958  End Time: 1002  Fluoro Time: 12  RN/CMA Chancy Smigiel, CMA Wood, CMA    Time 9:35am 1013    BP 115/76 119/82    Pulse 66 65    Respirations 14 14    O2 Sat 96 94    S/S 6 6    Pain Level 7/10 3/10     D/C home with husband, patient A & O X 3, D/C instructions reviewed, and sits independently.

## 2015-08-02 ENCOUNTER — Encounter: Payer: Self-pay | Admitting: Physical Medicine & Rehabilitation

## 2015-08-02 ENCOUNTER — Ambulatory Visit (HOSPITAL_BASED_OUTPATIENT_CLINIC_OR_DEPARTMENT_OTHER): Payer: Medicare Other | Admitting: Physical Medicine & Rehabilitation

## 2015-08-02 VITALS — BP 113/78 | HR 78 | Temp 98.5°F

## 2015-08-02 DIAGNOSIS — M4806 Spinal stenosis, lumbar region: Secondary | ICD-10-CM | POA: Diagnosis not present

## 2015-08-02 DIAGNOSIS — M47817 Spondylosis without myelopathy or radiculopathy, lumbosacral region: Secondary | ICD-10-CM

## 2015-08-02 DIAGNOSIS — M4120 Other idiopathic scoliosis, site unspecified: Secondary | ICD-10-CM

## 2015-08-02 DIAGNOSIS — M533 Sacrococcygeal disorders, not elsewhere classified: Secondary | ICD-10-CM

## 2015-08-02 DIAGNOSIS — M419 Scoliosis, unspecified: Secondary | ICD-10-CM

## 2015-08-02 DIAGNOSIS — M48062 Spinal stenosis, lumbar region with neurogenic claudication: Secondary | ICD-10-CM

## 2015-08-02 NOTE — Patient Instructions (Signed)
Patches include thermic care, which is heat Also lidocaine patches over-the-counter Also, menthol patches over-the-counter

## 2015-08-02 NOTE — Progress Notes (Signed)
Subjective:    Patient ID: [name], female    DOB: [birth year], 70 y.o.   MRN: [id number]  HPI 70 year old female with chronic low back pain. She is here today to discuss treatment options for her lumbar scoliosis and lumbar spinal stenosis. She is experienced increasing pain over the last several months. This was partially ameliorated by left L3, L4, L5 RFA on 06/04/2015, read. She then had some residual pain in the left PSIS area and underwent a left sacroiliac injection performed on 07/30/2015 also with good relief. Her primary pain concern today is her left flank pain. She denies any dysuria. She denies any sweats or chills. Does not feel sick or nauseated at all. This pain occurs both in the morning as well as after she's been moving about for a while. It does not bother her at night.  She is strongly considering thoracic or lumbar decompression fusion and correction of scoliotic deformities as recommended by Dr. Patrice Paradise.  He specifically was looking at using T10-S1. The patient would like to discuss the surgery as well as the rehabilitation process. She would be interested in going to inpatient rehabilitation facility rather than skilled nursing facility. She is currently taking baclofen 10 mg 3 times a day for spasms. In addition to occasional Tylenol with Codeine. Previously tested positive for alcohol and we discussed that in order to get further codeine prescriptions, she would need to quit drinking. Pain Inventory Average Pain 2  sitting Pain Right Now 7  While standing My pain is constant  In the last 24 hours, has pain interfered with the following? General activity 5 Relation with others 5 Enjoyment of life 5 What TIME of day is your pain at its worst? all Sleep (in general) NA  Pain is worse with: walking, standing and some activites Pain improves with: medication Relief from Meds: 2  Mobility walk without assistance  Function employed # of hrs/week  na  Neuro/Psych trouble walking  Prior Studies Any changes since last visit?  no  Physicians involved in your care Any changes since last visit?  no   Family History  Problem Relation Age of Onset  . Dementia Mother   . Stroke Mother   . Depression Mother   . COPD Father   . Cancer Brother    Social History   Social History  . Marital status: Married    Spouse name: N/A  . Number of children: N/A  . Years of education: N/A   Social History Main Topics  . Smoking status: Never Smoker  . Smokeless tobacco: Never Used  . Alcohol use None  . Drug use: Unknown  . Sexual activity: Not Asked   Other Topics Concern  . None   Social History Narrative  . None   Past Surgical History:  Procedure Laterality Date  . FOOT SURGERY  07/2011   Past Medical History:  Diagnosis Date  . Congenital musculoskeletal deformity of spine   . Incontinence of bowel 09/2013  . Lumbago   . Lumbosacral spondylosis without myelopathy   . Spinal stenosis, lumbar region, without neurogenic claudication    BP 113/78 (BP Location: Right Arm, Patient Position: Sitting, Cuff Size: Large)   Pulse 78   Temp 98.5 F (36.9 C) (Oral)   SpO2 93%   Opioid Risk Score:   Fall Risk Score:  `1  Depression screen PHQ 2/9  Depression screen [facility] 2/9 02/23/2015 02/16/2015 02/08/2015 01/22/2015  Decreased Interest 0 0 1 -  Down, Depressed,  Hopeless 0 0 1 1  PHQ - 2 Score 0 0 2 1  Altered sleeping 0 2 - 1  Tired, decreased energy 0 2 - 1  Change in appetite 0 0 - 0  Feeling bad or failure about yourself  0 0 - 0  Trouble concentrating 0 0 - 1  Moving slowly or fidgety/restless 0 0 - 0  Suicidal thoughts 0 0 - 0  PHQ-9 Score 0 4 - 4  Difficult doing work/chores - Very difficult - Somewhat difficult   Review of Systems  All other systems reviewed and are negative.      Objective:   Physical Exam  Constitutional: She is oriented to person, place, and time. She appears well-developed and  well-nourished.  HENT:  Head: Normocephalic and atraumatic.  Eyes: Conjunctivae and EOM are normal. Pupils are equal, round, and reactive to light.  Neck: Normal range of motion.  Pulmonary/Chest: Effort normal. No respiratory distress.  Neurological: She is alert and oriented to person, place, and time.  Psychiatric: She has a normal mood and affect.  Nursing note and vitals reviewed.  Dextroconvex scoliosis centered at L3 Tenderness palpation around the upper lumbar paraspinal muscles. Limited lumbar range of motion with flexion, extension, lateral bending and rotation. No tenderness over PSIS       Assessment & Plan:  1. Degenerative scoliosis. She has pain as well as decreased range of motion. This is also contributing to her lumbar facet arthropathy. We discussed treatment options including monitoring over time for any further worsening. Discussed difficulty in predicting rate of progression. We discussed postoperative rehabilitation including potential complications, such as bowel and bladder issues, infections, pain control issues as well as side effects from medications. We discussed that inpatient rehabilitation may be a good option for her and that usual length of stay is 10-14 days, although this can vary based on patient's progress as well as complicating factors.  2. Lumbar spinal stenosis. No evidence of any radiculopathy or myelopathy. At this point, no evidence of mobility issues  3. Left sacroiliac disorder. We discussed that even after a fusion. This may be an issue and could potentially be worse.  4. Lumbar facet arthropathy. This is also related to numbers 1 and 2. Radiofrequency ablation can be repeated every 6 months. She may benefit from T12, L1, L2 medial branch blocks to further assess the left flank pain.  Over half of the 25 min visit was spent counseling and coordinating care.

## 2015-08-31 ENCOUNTER — Ambulatory Visit: Payer: Medicare Other

## 2015-09-03 ENCOUNTER — Ambulatory Visit (HOSPITAL_BASED_OUTPATIENT_CLINIC_OR_DEPARTMENT_OTHER): Payer: Medicare Other | Admitting: Physical Medicine & Rehabilitation

## 2015-09-03 ENCOUNTER — Encounter: Payer: Self-pay | Admitting: Physical Medicine & Rehabilitation

## 2015-09-03 ENCOUNTER — Encounter: Payer: Medicare Other | Attending: Physical Medicine & Rehabilitation

## 2015-09-03 VITALS — BP 115/81 | HR 70 | Resp 14

## 2015-09-03 DIAGNOSIS — M47817 Spondylosis without myelopathy or radiculopathy, lumbosacral region: Secondary | ICD-10-CM | POA: Insufficient documentation

## 2015-09-03 MED ORDER — ACETAMINOPHEN-CODEINE #3 300-30 MG PO TABS: 1.0000 | ORAL_TABLET | Freq: Three times a day (TID) | ORAL | 2 refills | Status: DC | PRN

## 2015-09-03 NOTE — Patient Instructions (Signed)

## 2015-09-03 NOTE — Progress Notes (Signed)
Left Lumbar L3, L4  medial branch blocks and L 5 dorsal ramus injection under fluoroscopic guidance   Indication: Left Lumbar pain which is not relieved by medication management or other conservative care and interfering with self-care and mobility.  Informed consent was obtained after describing risks and benefits of the procedure with the patient, this includes bleeding, bruising, infection, paralysis and medication side effects.  The patient wishes to proceed and has given written consent.  The patient was placed in a prone position.  The lumbar area was marked and prepped with Betadine.  One mL of 1% lidocaine was injected into each of 3 areas into the skin and subcutaneous tissue.  Then a 22-gauge 5in spinal needle was inserted targeting the junction of the left S1 superior articular process and sacral ala junction.  Needle was advanced under fluoroscopic guidance.  Bone contact was made.  Omnipaque 180 was injected x 0.5 mL demonstrating no intravascular uptake.  Then a solution containing one mL of 4 mg per mL dexamethasone and 3 mL of 2% MPF lidocaine was injected x 0.5 mL.  Then the left L5 superior articular process in transverse process junction was targeted.  Bone contact was made.  Omnipaque 180 was injected x 0.5 mL demonstrating no intravascular uptake.  Then a solution containing one mL of 4 mg per mL dexamethasone and 3 mL of 2% MPF lidocaine was injected x 0.5 mL.  Then the left L4 superior articular process in transverse process junction was targeted.  Bone contact was made.  Omnipaque 180 was injected x 0.5 mL demonstrating no intravascular uptake.  Then a solution containing one mL of 4 mg per mL dexamethasone and 3 mL of 2% MPF lidocaine was injected x 0.5 mL.  Patient tolerated procedure well.  Post procedure instructions were given.

## 2015-09-03 NOTE — Progress Notes (Addendum)
  PROCEDURE RECORD [facility]   Name: [name] DOB:1945-06-12 MRN: [id number]  Date:09/03/2015  Physician: Alysia Penna, MD    Nurse/CMA: Kasten Leveque, CMA  Allergies: No Known Allergies  Consent Signed: Yes.    Is patient diabetic? No.  CBG today?   Pregnant: No. LMP: No LMP recorded. Patient is postmenopausal. (age 70-55)  Anticoagulants: no Anti-inflammatory: no Antibiotics: no  Procedure: left medial branch block  Position: Prone Start Time: 10:49am  End Time: 10:56am   Fluoro Time: 7  RN/CMA Villa Burgin, CMA Abdiaziz Klahn, CMA    Time 10:25am 11:04am    BP 115/81 117/83    Pulse 70 66    Respirations 14 14    O2 Sat 93 93    S/S 6 6    Pain Level 6/10 1/10     D/C home with husband, patient A & O X 3, D/C instructions reviewed, and sits independently.

## 2015-09-07 ENCOUNTER — Ambulatory Visit
Admission: RE | Admit: 2015-09-07 | Discharge: 2015-09-07 | Disposition: A | Discharge status: Home / Self Care | Payer: Medicare Other | Source: Ambulatory Visit | Attending: Family Medicine | Admitting: Family Medicine

## 2015-09-07 DIAGNOSIS — Z1231 Encounter for screening mammogram for malignant neoplasm of breast: Secondary | ICD-10-CM

## 2015-09-20 DIAGNOSIS — F4321 Adjustment disorder with depressed mood: Secondary | ICD-10-CM | POA: Diagnosis not present

## 2015-09-24 DIAGNOSIS — M4125 Other idiopathic scoliosis, thoracolumbar region: Secondary | ICD-10-CM | POA: Diagnosis not present

## 2015-09-24 DIAGNOSIS — M5416 Radiculopathy, lumbar region: Secondary | ICD-10-CM | POA: Diagnosis not present

## 2015-09-24 DIAGNOSIS — M4010 Other secondary kyphosis, site unspecified: Secondary | ICD-10-CM | POA: Diagnosis not present

## 2015-09-24 DIAGNOSIS — M4150 Other secondary scoliosis, site unspecified: Secondary | ICD-10-CM | POA: Diagnosis not present

## 2015-10-01 DIAGNOSIS — M4125 Other idiopathic scoliosis, thoracolumbar region: Secondary | ICD-10-CM | POA: Diagnosis not present

## 2015-10-01 DIAGNOSIS — M4806 Spinal stenosis, lumbar region: Secondary | ICD-10-CM | POA: Diagnosis not present

## 2015-10-01 DIAGNOSIS — M4010 Other secondary kyphosis, site unspecified: Secondary | ICD-10-CM | POA: Diagnosis not present

## 2015-10-01 DIAGNOSIS — M4150 Other secondary scoliosis, site unspecified: Secondary | ICD-10-CM | POA: Diagnosis not present

## 2015-10-05 DIAGNOSIS — M4314 Spondylolisthesis, thoracic region: Secondary | ICD-10-CM | POA: Diagnosis not present

## 2015-10-11 DIAGNOSIS — E785 Hyperlipidemia, unspecified: Secondary | ICD-10-CM | POA: Diagnosis present

## 2015-10-11 DIAGNOSIS — M4315 Spondylolisthesis, thoracolumbar region: Secondary | ICD-10-CM | POA: Diagnosis not present

## 2015-10-11 DIAGNOSIS — G4733 Obstructive sleep apnea (adult) (pediatric): Secondary | ICD-10-CM | POA: Diagnosis not present

## 2015-10-11 DIAGNOSIS — M4324 Fusion of spine, thoracic region: Secondary | ICD-10-CM | POA: Diagnosis not present

## 2015-10-11 DIAGNOSIS — M5442 Lumbago with sciatica, left side: Secondary | ICD-10-CM | POA: Diagnosis not present

## 2015-10-11 DIAGNOSIS — Z4789 Encounter for other orthopedic aftercare: Secondary | ICD-10-CM | POA: Diagnosis not present

## 2015-10-11 DIAGNOSIS — G4730 Sleep apnea, unspecified: Secondary | ICD-10-CM | POA: Diagnosis present

## 2015-10-11 DIAGNOSIS — M4186 Other forms of scoliosis, lumbar region: Secondary | ICD-10-CM | POA: Diagnosis not present

## 2015-10-11 DIAGNOSIS — M4156 Other secondary scoliosis, lumbar region: Secondary | ICD-10-CM | POA: Diagnosis present

## 2015-10-11 DIAGNOSIS — M4316 Spondylolisthesis, lumbar region: Secondary | ICD-10-CM | POA: Diagnosis present

## 2015-10-11 DIAGNOSIS — M4005 Postural kyphosis, thoracolumbar region: Secondary | ICD-10-CM | POA: Diagnosis not present

## 2015-10-11 DIAGNOSIS — N390 Urinary tract infection, site not specified: Secondary | ICD-10-CM | POA: Diagnosis not present

## 2015-10-11 DIAGNOSIS — F411 Generalized anxiety disorder: Secondary | ICD-10-CM | POA: Diagnosis not present

## 2015-10-11 DIAGNOSIS — D649 Anemia, unspecified: Secondary | ICD-10-CM | POA: Diagnosis not present

## 2015-10-11 DIAGNOSIS — M4150 Other secondary scoliosis, site unspecified: Secondary | ICD-10-CM | POA: Diagnosis not present

## 2015-10-11 DIAGNOSIS — M4010 Other secondary kyphosis, site unspecified: Secondary | ICD-10-CM | POA: Diagnosis not present

## 2015-10-11 DIAGNOSIS — K5909 Other constipation: Secondary | ICD-10-CM | POA: Diagnosis present

## 2015-10-11 DIAGNOSIS — K5903 Drug induced constipation: Secondary | ICD-10-CM | POA: Diagnosis not present

## 2015-10-11 DIAGNOSIS — T402X5A Adverse effect of other opioids, initial encounter: Secondary | ICD-10-CM | POA: Diagnosis not present

## 2015-10-11 DIAGNOSIS — M4726 Other spondylosis with radiculopathy, lumbar region: Secondary | ICD-10-CM | POA: Diagnosis not present

## 2015-10-11 DIAGNOSIS — M4326 Fusion of spine, lumbar region: Secondary | ICD-10-CM | POA: Diagnosis not present

## 2015-10-11 DIAGNOSIS — M48061 Spinal stenosis, lumbar region without neurogenic claudication: Secondary | ICD-10-CM | POA: Diagnosis present

## 2015-10-11 DIAGNOSIS — M5441 Lumbago with sciatica, right side: Secondary | ICD-10-CM | POA: Diagnosis not present

## 2015-10-11 DIAGNOSIS — I10 Essential (primary) hypertension: Secondary | ICD-10-CM | POA: Diagnosis present

## 2015-10-11 DIAGNOSIS — Z9989 Dependence on other enabling machines and devices: Secondary | ICD-10-CM | POA: Diagnosis not present

## 2015-10-14 DIAGNOSIS — Z6831 Body mass index (BMI) 31.0-31.9, adult: Secondary | ICD-10-CM | POA: Diagnosis not present

## 2015-10-14 DIAGNOSIS — M40299 Other kyphosis, site unspecified: Secondary | ICD-10-CM | POA: Diagnosis not present

## 2015-10-14 DIAGNOSIS — D72829 Elevated white blood cell count, unspecified: Secondary | ICD-10-CM | POA: Diagnosis present

## 2015-10-14 DIAGNOSIS — G4733 Obstructive sleep apnea (adult) (pediatric): Secondary | ICD-10-CM | POA: Diagnosis not present

## 2015-10-14 DIAGNOSIS — K5903 Drug induced constipation: Secondary | ICD-10-CM | POA: Diagnosis not present

## 2015-10-14 DIAGNOSIS — K5909 Other constipation: Secondary | ICD-10-CM | POA: Diagnosis not present

## 2015-10-14 DIAGNOSIS — M5442 Lumbago with sciatica, left side: Secondary | ICD-10-CM | POA: Diagnosis not present

## 2015-10-14 DIAGNOSIS — I10 Essential (primary) hypertension: Secondary | ICD-10-CM | POA: Diagnosis present

## 2015-10-14 DIAGNOSIS — Z4789 Encounter for other orthopedic aftercare: Secondary | ICD-10-CM | POA: Diagnosis not present

## 2015-10-14 DIAGNOSIS — M4186 Other forms of scoliosis, lumbar region: Secondary | ICD-10-CM | POA: Diagnosis present

## 2015-10-14 DIAGNOSIS — F411 Generalized anxiety disorder: Secondary | ICD-10-CM | POA: Diagnosis not present

## 2015-10-14 DIAGNOSIS — K219 Gastro-esophageal reflux disease without esophagitis: Secondary | ICD-10-CM | POA: Diagnosis present

## 2015-10-14 DIAGNOSIS — M4005 Postural kyphosis, thoracolumbar region: Secondary | ICD-10-CM | POA: Diagnosis not present

## 2015-10-14 DIAGNOSIS — M5441 Lumbago with sciatica, right side: Secondary | ICD-10-CM | POA: Diagnosis not present

## 2015-10-14 DIAGNOSIS — B9620 Unspecified Escherichia coli [E. coli] as the cause of diseases classified elsewhere: Secondary | ICD-10-CM | POA: Diagnosis present

## 2015-10-14 DIAGNOSIS — Z8249 Family history of ischemic heart disease and other diseases of the circulatory system: Secondary | ICD-10-CM | POA: Diagnosis not present

## 2015-10-14 DIAGNOSIS — T402X5A Adverse effect of other opioids, initial encounter: Secondary | ICD-10-CM | POA: Diagnosis not present

## 2015-10-14 DIAGNOSIS — Z981 Arthrodesis status: Secondary | ICD-10-CM | POA: Diagnosis not present

## 2015-10-14 DIAGNOSIS — M4316 Spondylolisthesis, lumbar region: Secondary | ICD-10-CM | POA: Diagnosis present

## 2015-10-14 DIAGNOSIS — N390 Urinary tract infection, site not specified: Secondary | ICD-10-CM | POA: Diagnosis present

## 2015-10-14 DIAGNOSIS — M545 Low back pain: Secondary | ICD-10-CM | POA: Diagnosis not present

## 2015-10-14 DIAGNOSIS — G4700 Insomnia, unspecified: Secondary | ICD-10-CM | POA: Diagnosis present

## 2015-10-14 DIAGNOSIS — M4726 Other spondylosis with radiculopathy, lumbar region: Secondary | ICD-10-CM | POA: Diagnosis not present

## 2015-10-14 DIAGNOSIS — M47814 Spondylosis without myelopathy or radiculopathy, thoracic region: Secondary | ICD-10-CM | POA: Diagnosis not present

## 2015-10-14 DIAGNOSIS — M48061 Spinal stenosis, lumbar region without neurogenic claudication: Secondary | ICD-10-CM | POA: Diagnosis not present

## 2015-10-14 DIAGNOSIS — Z9989 Dependence on other enabling machines and devices: Secondary | ICD-10-CM | POA: Diagnosis not present

## 2015-10-14 DIAGNOSIS — D649 Anemia, unspecified: Secondary | ICD-10-CM | POA: Diagnosis present

## 2015-10-29 DIAGNOSIS — M47814 Spondylosis without myelopathy or radiculopathy, thoracic region: Secondary | ICD-10-CM | POA: Diagnosis not present

## 2015-10-29 DIAGNOSIS — Z981 Arthrodesis status: Secondary | ICD-10-CM | POA: Diagnosis not present

## 2015-10-30 DIAGNOSIS — Z4789 Encounter for other orthopedic aftercare: Secondary | ICD-10-CM | POA: Diagnosis not present

## 2015-10-30 DIAGNOSIS — E669 Obesity, unspecified: Secondary | ICD-10-CM | POA: Diagnosis not present

## 2015-10-30 DIAGNOSIS — K5903 Drug induced constipation: Secondary | ICD-10-CM | POA: Diagnosis not present

## 2015-10-30 DIAGNOSIS — M4316 Spondylolisthesis, lumbar region: Secondary | ICD-10-CM | POA: Diagnosis not present

## 2015-10-30 DIAGNOSIS — Z6831 Body mass index (BMI) 31.0-31.9, adult: Secondary | ICD-10-CM | POA: Diagnosis not present

## 2015-10-30 DIAGNOSIS — M48061 Spinal stenosis, lumbar region without neurogenic claudication: Secondary | ICD-10-CM | POA: Diagnosis not present

## 2015-10-30 DIAGNOSIS — T402X1D Poisoning by other opioids, accidental (unintentional), subsequent encounter: Secondary | ICD-10-CM | POA: Diagnosis not present

## 2015-10-30 DIAGNOSIS — M40205 Unspecified kyphosis, thoracolumbar region: Secondary | ICD-10-CM | POA: Diagnosis not present

## 2015-10-30 DIAGNOSIS — G4733 Obstructive sleep apnea (adult) (pediatric): Secondary | ICD-10-CM | POA: Diagnosis not present

## 2015-10-30 DIAGNOSIS — F419 Anxiety disorder, unspecified: Secondary | ICD-10-CM | POA: Diagnosis not present

## 2015-11-02 DIAGNOSIS — M48061 Spinal stenosis, lumbar region without neurogenic claudication: Secondary | ICD-10-CM | POA: Diagnosis not present

## 2015-11-02 DIAGNOSIS — M4316 Spondylolisthesis, lumbar region: Secondary | ICD-10-CM | POA: Diagnosis not present

## 2015-11-02 DIAGNOSIS — T402X1D Poisoning by other opioids, accidental (unintentional), subsequent encounter: Secondary | ICD-10-CM | POA: Diagnosis not present

## 2015-11-02 DIAGNOSIS — Z4789 Encounter for other orthopedic aftercare: Secondary | ICD-10-CM | POA: Diagnosis not present

## 2015-11-02 DIAGNOSIS — K5903 Drug induced constipation: Secondary | ICD-10-CM | POA: Diagnosis not present

## 2015-11-02 DIAGNOSIS — M40205 Unspecified kyphosis, thoracolumbar region: Secondary | ICD-10-CM | POA: Diagnosis not present

## 2015-11-03 DIAGNOSIS — K5903 Drug induced constipation: Secondary | ICD-10-CM | POA: Diagnosis not present

## 2015-11-03 DIAGNOSIS — F325 Major depressive disorder, single episode, in full remission: Secondary | ICD-10-CM | POA: Diagnosis not present

## 2015-11-03 DIAGNOSIS — Z4789 Encounter for other orthopedic aftercare: Secondary | ICD-10-CM | POA: Diagnosis not present

## 2015-11-03 DIAGNOSIS — M40205 Unspecified kyphosis, thoracolumbar region: Secondary | ICD-10-CM | POA: Diagnosis not present

## 2015-11-03 DIAGNOSIS — T402X1D Poisoning by other opioids, accidental (unintentional), subsequent encounter: Secondary | ICD-10-CM | POA: Diagnosis not present

## 2015-11-03 DIAGNOSIS — M48061 Spinal stenosis, lumbar region without neurogenic claudication: Secondary | ICD-10-CM | POA: Diagnosis not present

## 2015-11-03 DIAGNOSIS — M419 Scoliosis, unspecified: Secondary | ICD-10-CM | POA: Diagnosis not present

## 2015-11-03 DIAGNOSIS — M4316 Spondylolisthesis, lumbar region: Secondary | ICD-10-CM | POA: Diagnosis not present

## 2015-11-05 DIAGNOSIS — M48061 Spinal stenosis, lumbar region without neurogenic claudication: Secondary | ICD-10-CM | POA: Diagnosis not present

## 2015-11-05 DIAGNOSIS — Z4789 Encounter for other orthopedic aftercare: Secondary | ICD-10-CM | POA: Diagnosis not present

## 2015-11-05 DIAGNOSIS — T402X1D Poisoning by other opioids, accidental (unintentional), subsequent encounter: Secondary | ICD-10-CM | POA: Diagnosis not present

## 2015-11-05 DIAGNOSIS — M40205 Unspecified kyphosis, thoracolumbar region: Secondary | ICD-10-CM | POA: Diagnosis not present

## 2015-11-05 DIAGNOSIS — M4316 Spondylolisthesis, lumbar region: Secondary | ICD-10-CM | POA: Diagnosis not present

## 2015-11-05 DIAGNOSIS — K5903 Drug induced constipation: Secondary | ICD-10-CM | POA: Diagnosis not present

## 2015-11-08 DIAGNOSIS — Z4789 Encounter for other orthopedic aftercare: Secondary | ICD-10-CM | POA: Diagnosis not present

## 2015-11-08 DIAGNOSIS — M40205 Unspecified kyphosis, thoracolumbar region: Secondary | ICD-10-CM | POA: Diagnosis not present

## 2015-11-08 DIAGNOSIS — M48061 Spinal stenosis, lumbar region without neurogenic claudication: Secondary | ICD-10-CM | POA: Diagnosis not present

## 2015-11-08 DIAGNOSIS — K5903 Drug induced constipation: Secondary | ICD-10-CM | POA: Diagnosis not present

## 2015-11-08 DIAGNOSIS — M4316 Spondylolisthesis, lumbar region: Secondary | ICD-10-CM | POA: Diagnosis not present

## 2015-11-08 DIAGNOSIS — T402X1D Poisoning by other opioids, accidental (unintentional), subsequent encounter: Secondary | ICD-10-CM | POA: Diagnosis not present

## 2015-11-09 DIAGNOSIS — M48061 Spinal stenosis, lumbar region without neurogenic claudication: Secondary | ICD-10-CM | POA: Diagnosis not present

## 2015-11-09 DIAGNOSIS — K5903 Drug induced constipation: Secondary | ICD-10-CM | POA: Diagnosis not present

## 2015-11-09 DIAGNOSIS — M549 Dorsalgia, unspecified: Secondary | ICD-10-CM | POA: Diagnosis not present

## 2015-11-09 DIAGNOSIS — M4316 Spondylolisthesis, lumbar region: Secondary | ICD-10-CM | POA: Diagnosis not present

## 2015-11-09 DIAGNOSIS — Z4789 Encounter for other orthopedic aftercare: Secondary | ICD-10-CM | POA: Diagnosis not present

## 2015-11-09 DIAGNOSIS — T402X1D Poisoning by other opioids, accidental (unintentional), subsequent encounter: Secondary | ICD-10-CM | POA: Diagnosis not present

## 2015-11-09 DIAGNOSIS — M40205 Unspecified kyphosis, thoracolumbar region: Secondary | ICD-10-CM | POA: Diagnosis not present

## 2015-11-10 DIAGNOSIS — M48061 Spinal stenosis, lumbar region without neurogenic claudication: Secondary | ICD-10-CM | POA: Diagnosis not present

## 2015-11-10 DIAGNOSIS — M40205 Unspecified kyphosis, thoracolumbar region: Secondary | ICD-10-CM | POA: Diagnosis not present

## 2015-11-10 DIAGNOSIS — Z4789 Encounter for other orthopedic aftercare: Secondary | ICD-10-CM | POA: Diagnosis not present

## 2015-11-10 DIAGNOSIS — M4316 Spondylolisthesis, lumbar region: Secondary | ICD-10-CM | POA: Diagnosis not present

## 2015-11-10 DIAGNOSIS — T402X1D Poisoning by other opioids, accidental (unintentional), subsequent encounter: Secondary | ICD-10-CM | POA: Diagnosis not present

## 2015-11-10 DIAGNOSIS — K5903 Drug induced constipation: Secondary | ICD-10-CM | POA: Diagnosis not present

## 2015-11-11 DIAGNOSIS — Z0000 Encounter for general adult medical examination without abnormal findings: Secondary | ICD-10-CM | POA: Diagnosis not present

## 2015-11-11 DIAGNOSIS — E669 Obesity, unspecified: Secondary | ICD-10-CM | POA: Diagnosis not present

## 2015-11-11 DIAGNOSIS — I10 Essential (primary) hypertension: Secondary | ICD-10-CM | POA: Diagnosis not present

## 2015-11-11 DIAGNOSIS — E7800 Pure hypercholesterolemia, unspecified: Secondary | ICD-10-CM | POA: Diagnosis not present

## 2015-11-11 DIAGNOSIS — E559 Vitamin D deficiency, unspecified: Secondary | ICD-10-CM | POA: Diagnosis not present

## 2015-11-11 DIAGNOSIS — F325 Major depressive disorder, single episode, in full remission: Secondary | ICD-10-CM | POA: Diagnosis not present

## 2015-11-12 DIAGNOSIS — M4316 Spondylolisthesis, lumbar region: Secondary | ICD-10-CM | POA: Diagnosis not present

## 2015-11-12 DIAGNOSIS — Z4789 Encounter for other orthopedic aftercare: Secondary | ICD-10-CM | POA: Diagnosis not present

## 2015-11-12 DIAGNOSIS — M48061 Spinal stenosis, lumbar region without neurogenic claudication: Secondary | ICD-10-CM | POA: Diagnosis not present

## 2015-11-12 DIAGNOSIS — T402X1D Poisoning by other opioids, accidental (unintentional), subsequent encounter: Secondary | ICD-10-CM | POA: Diagnosis not present

## 2015-11-12 DIAGNOSIS — M40205 Unspecified kyphosis, thoracolumbar region: Secondary | ICD-10-CM | POA: Diagnosis not present

## 2015-11-12 DIAGNOSIS — K5903 Drug induced constipation: Secondary | ICD-10-CM | POA: Diagnosis not present

## 2015-11-15 DIAGNOSIS — M48061 Spinal stenosis, lumbar region without neurogenic claudication: Secondary | ICD-10-CM | POA: Diagnosis not present

## 2015-11-15 DIAGNOSIS — Z4789 Encounter for other orthopedic aftercare: Secondary | ICD-10-CM | POA: Diagnosis not present

## 2015-11-15 DIAGNOSIS — M4316 Spondylolisthesis, lumbar region: Secondary | ICD-10-CM | POA: Diagnosis not present

## 2015-11-15 DIAGNOSIS — M40205 Unspecified kyphosis, thoracolumbar region: Secondary | ICD-10-CM | POA: Diagnosis not present

## 2015-11-15 DIAGNOSIS — K5903 Drug induced constipation: Secondary | ICD-10-CM | POA: Diagnosis not present

## 2015-11-15 DIAGNOSIS — T402X1D Poisoning by other opioids, accidental (unintentional), subsequent encounter: Secondary | ICD-10-CM | POA: Diagnosis not present

## 2015-11-16 DIAGNOSIS — Z4789 Encounter for other orthopedic aftercare: Secondary | ICD-10-CM | POA: Diagnosis not present

## 2015-11-16 DIAGNOSIS — K5903 Drug induced constipation: Secondary | ICD-10-CM | POA: Diagnosis not present

## 2015-11-16 DIAGNOSIS — M40205 Unspecified kyphosis, thoracolumbar region: Secondary | ICD-10-CM | POA: Diagnosis not present

## 2015-11-16 DIAGNOSIS — M48061 Spinal stenosis, lumbar region without neurogenic claudication: Secondary | ICD-10-CM | POA: Diagnosis not present

## 2015-11-16 DIAGNOSIS — T402X1D Poisoning by other opioids, accidental (unintentional), subsequent encounter: Secondary | ICD-10-CM | POA: Diagnosis not present

## 2015-11-16 DIAGNOSIS — M4316 Spondylolisthesis, lumbar region: Secondary | ICD-10-CM | POA: Diagnosis not present

## 2015-11-17 DIAGNOSIS — M40205 Unspecified kyphosis, thoracolumbar region: Secondary | ICD-10-CM | POA: Diagnosis not present

## 2015-11-17 DIAGNOSIS — M48061 Spinal stenosis, lumbar region without neurogenic claudication: Secondary | ICD-10-CM | POA: Diagnosis not present

## 2015-11-17 DIAGNOSIS — M4316 Spondylolisthesis, lumbar region: Secondary | ICD-10-CM | POA: Diagnosis not present

## 2015-11-17 DIAGNOSIS — Z4789 Encounter for other orthopedic aftercare: Secondary | ICD-10-CM | POA: Diagnosis not present

## 2015-11-17 DIAGNOSIS — K5903 Drug induced constipation: Secondary | ICD-10-CM | POA: Diagnosis not present

## 2015-11-17 DIAGNOSIS — T402X1D Poisoning by other opioids, accidental (unintentional), subsequent encounter: Secondary | ICD-10-CM | POA: Diagnosis not present

## 2015-11-19 DIAGNOSIS — K5903 Drug induced constipation: Secondary | ICD-10-CM | POA: Diagnosis not present

## 2015-11-19 DIAGNOSIS — M4316 Spondylolisthesis, lumbar region: Secondary | ICD-10-CM | POA: Diagnosis not present

## 2015-11-19 DIAGNOSIS — Z4789 Encounter for other orthopedic aftercare: Secondary | ICD-10-CM | POA: Diagnosis not present

## 2015-11-19 DIAGNOSIS — M48061 Spinal stenosis, lumbar region without neurogenic claudication: Secondary | ICD-10-CM | POA: Diagnosis not present

## 2015-11-19 DIAGNOSIS — T402X1D Poisoning by other opioids, accidental (unintentional), subsequent encounter: Secondary | ICD-10-CM | POA: Diagnosis not present

## 2015-11-19 DIAGNOSIS — M40205 Unspecified kyphosis, thoracolumbar region: Secondary | ICD-10-CM | POA: Diagnosis not present

## 2015-11-24 DIAGNOSIS — M4316 Spondylolisthesis, lumbar region: Secondary | ICD-10-CM | POA: Diagnosis not present

## 2015-11-24 DIAGNOSIS — T402X1D Poisoning by other opioids, accidental (unintentional), subsequent encounter: Secondary | ICD-10-CM | POA: Diagnosis not present

## 2015-11-24 DIAGNOSIS — K5903 Drug induced constipation: Secondary | ICD-10-CM | POA: Diagnosis not present

## 2015-11-24 DIAGNOSIS — Z4789 Encounter for other orthopedic aftercare: Secondary | ICD-10-CM | POA: Diagnosis not present

## 2015-11-24 DIAGNOSIS — M48061 Spinal stenosis, lumbar region without neurogenic claudication: Secondary | ICD-10-CM | POA: Diagnosis not present

## 2015-11-24 DIAGNOSIS — M40205 Unspecified kyphosis, thoracolumbar region: Secondary | ICD-10-CM | POA: Diagnosis not present

## 2015-11-25 DIAGNOSIS — K5903 Drug induced constipation: Secondary | ICD-10-CM | POA: Diagnosis not present

## 2015-11-25 DIAGNOSIS — Z4789 Encounter for other orthopedic aftercare: Secondary | ICD-10-CM | POA: Diagnosis not present

## 2015-11-25 DIAGNOSIS — M4316 Spondylolisthesis, lumbar region: Secondary | ICD-10-CM | POA: Diagnosis not present

## 2015-11-25 DIAGNOSIS — T402X1D Poisoning by other opioids, accidental (unintentional), subsequent encounter: Secondary | ICD-10-CM | POA: Diagnosis not present

## 2015-11-25 DIAGNOSIS — M48061 Spinal stenosis, lumbar region without neurogenic claudication: Secondary | ICD-10-CM | POA: Diagnosis not present

## 2015-11-25 DIAGNOSIS — M40205 Unspecified kyphosis, thoracolumbar region: Secondary | ICD-10-CM | POA: Diagnosis not present

## 2015-11-30 DIAGNOSIS — Z4789 Encounter for other orthopedic aftercare: Secondary | ICD-10-CM | POA: Diagnosis not present

## 2015-11-30 DIAGNOSIS — M48061 Spinal stenosis, lumbar region without neurogenic claudication: Secondary | ICD-10-CM | POA: Diagnosis not present

## 2015-11-30 DIAGNOSIS — M4316 Spondylolisthesis, lumbar region: Secondary | ICD-10-CM | POA: Diagnosis not present

## 2015-11-30 DIAGNOSIS — T402X1D Poisoning by other opioids, accidental (unintentional), subsequent encounter: Secondary | ICD-10-CM | POA: Diagnosis not present

## 2015-11-30 DIAGNOSIS — M40205 Unspecified kyphosis, thoracolumbar region: Secondary | ICD-10-CM | POA: Diagnosis not present

## 2015-11-30 DIAGNOSIS — K5903 Drug induced constipation: Secondary | ICD-10-CM | POA: Diagnosis not present

## 2016-01-05 DIAGNOSIS — M4316 Spondylolisthesis, lumbar region: Secondary | ICD-10-CM | POA: Diagnosis not present

## 2016-01-12 DIAGNOSIS — M6281 Muscle weakness (generalized): Secondary | ICD-10-CM | POA: Diagnosis not present

## 2016-01-12 DIAGNOSIS — M545 Low back pain: Secondary | ICD-10-CM | POA: Diagnosis not present

## 2016-01-12 DIAGNOSIS — M419 Scoliosis, unspecified: Secondary | ICD-10-CM | POA: Diagnosis not present

## 2016-01-17 DIAGNOSIS — M6281 Muscle weakness (generalized): Secondary | ICD-10-CM | POA: Diagnosis not present

## 2016-01-17 DIAGNOSIS — M419 Scoliosis, unspecified: Secondary | ICD-10-CM | POA: Diagnosis not present

## 2016-01-17 DIAGNOSIS — M545 Low back pain: Secondary | ICD-10-CM | POA: Diagnosis not present

## 2016-01-19 DIAGNOSIS — M6281 Muscle weakness (generalized): Secondary | ICD-10-CM | POA: Diagnosis not present

## 2016-01-19 DIAGNOSIS — M419 Scoliosis, unspecified: Secondary | ICD-10-CM | POA: Diagnosis not present

## 2016-01-19 DIAGNOSIS — M545 Low back pain: Secondary | ICD-10-CM | POA: Diagnosis not present

## 2016-01-25 DIAGNOSIS — M6281 Muscle weakness (generalized): Secondary | ICD-10-CM | POA: Diagnosis not present

## 2016-01-25 DIAGNOSIS — M545 Low back pain: Secondary | ICD-10-CM | POA: Diagnosis not present

## 2016-01-25 DIAGNOSIS — M419 Scoliosis, unspecified: Secondary | ICD-10-CM | POA: Diagnosis not present

## 2016-01-31 DIAGNOSIS — M545 Low back pain: Secondary | ICD-10-CM | POA: Diagnosis not present

## 2016-01-31 DIAGNOSIS — M419 Scoliosis, unspecified: Secondary | ICD-10-CM | POA: Diagnosis not present

## 2016-01-31 DIAGNOSIS — M6281 Muscle weakness (generalized): Secondary | ICD-10-CM | POA: Diagnosis not present

## 2016-02-02 DIAGNOSIS — M6281 Muscle weakness (generalized): Secondary | ICD-10-CM | POA: Diagnosis not present

## 2016-02-02 DIAGNOSIS — M419 Scoliosis, unspecified: Secondary | ICD-10-CM | POA: Diagnosis not present

## 2016-02-02 DIAGNOSIS — M545 Low back pain: Secondary | ICD-10-CM | POA: Diagnosis not present

## 2016-02-03 DIAGNOSIS — M47817 Spondylosis without myelopathy or radiculopathy, lumbosacral region: Secondary | ICD-10-CM | POA: Diagnosis not present

## 2016-02-03 DIAGNOSIS — M4325 Fusion of spine, thoracolumbar region: Secondary | ICD-10-CM | POA: Diagnosis not present

## 2016-02-03 DIAGNOSIS — Z6831 Body mass index (BMI) 31.0-31.9, adult: Secondary | ICD-10-CM | POA: Diagnosis not present

## 2016-02-07 DIAGNOSIS — M419 Scoliosis, unspecified: Secondary | ICD-10-CM | POA: Diagnosis not present

## 2016-02-07 DIAGNOSIS — M6281 Muscle weakness (generalized): Secondary | ICD-10-CM | POA: Diagnosis not present

## 2016-02-07 DIAGNOSIS — M545 Low back pain: Secondary | ICD-10-CM | POA: Diagnosis not present

## 2016-02-09 DIAGNOSIS — M419 Scoliosis, unspecified: Secondary | ICD-10-CM | POA: Diagnosis not present

## 2016-02-09 DIAGNOSIS — M545 Low back pain: Secondary | ICD-10-CM | POA: Diagnosis not present

## 2016-02-09 DIAGNOSIS — M6281 Muscle weakness (generalized): Secondary | ICD-10-CM | POA: Diagnosis not present

## 2016-02-14 DIAGNOSIS — M6281 Muscle weakness (generalized): Secondary | ICD-10-CM | POA: Diagnosis not present

## 2016-02-14 DIAGNOSIS — M545 Low back pain: Secondary | ICD-10-CM | POA: Diagnosis not present

## 2016-02-14 DIAGNOSIS — M419 Scoliosis, unspecified: Secondary | ICD-10-CM | POA: Diagnosis not present

## 2016-02-17 DIAGNOSIS — M545 Low back pain: Secondary | ICD-10-CM | POA: Diagnosis not present

## 2016-02-17 DIAGNOSIS — F4321 Adjustment disorder with depressed mood: Secondary | ICD-10-CM | POA: Diagnosis not present

## 2016-02-17 DIAGNOSIS — M6281 Muscle weakness (generalized): Secondary | ICD-10-CM | POA: Diagnosis not present

## 2016-02-17 DIAGNOSIS — M419 Scoliosis, unspecified: Secondary | ICD-10-CM | POA: Diagnosis not present

## 2016-02-21 DIAGNOSIS — M6281 Muscle weakness (generalized): Secondary | ICD-10-CM | POA: Diagnosis not present

## 2016-02-21 DIAGNOSIS — M419 Scoliosis, unspecified: Secondary | ICD-10-CM | POA: Diagnosis not present

## 2016-02-21 DIAGNOSIS — M545 Low back pain: Secondary | ICD-10-CM | POA: Diagnosis not present

## 2016-02-24 DIAGNOSIS — M419 Scoliosis, unspecified: Secondary | ICD-10-CM | POA: Diagnosis not present

## 2016-02-24 DIAGNOSIS — M6281 Muscle weakness (generalized): Secondary | ICD-10-CM | POA: Diagnosis not present

## 2016-02-24 DIAGNOSIS — M545 Low back pain: Secondary | ICD-10-CM | POA: Diagnosis not present

## 2016-02-28 DIAGNOSIS — M545 Low back pain: Secondary | ICD-10-CM | POA: Diagnosis not present

## 2016-02-28 DIAGNOSIS — M419 Scoliosis, unspecified: Secondary | ICD-10-CM | POA: Diagnosis not present

## 2016-02-28 DIAGNOSIS — M6281 Muscle weakness (generalized): Secondary | ICD-10-CM | POA: Diagnosis not present

## 2016-03-02 DIAGNOSIS — M419 Scoliosis, unspecified: Secondary | ICD-10-CM | POA: Diagnosis not present

## 2016-03-02 DIAGNOSIS — M545 Low back pain: Secondary | ICD-10-CM | POA: Diagnosis not present

## 2016-03-02 DIAGNOSIS — M6281 Muscle weakness (generalized): Secondary | ICD-10-CM | POA: Diagnosis not present

## 2016-03-09 DIAGNOSIS — M6281 Muscle weakness (generalized): Secondary | ICD-10-CM | POA: Diagnosis not present

## 2016-03-09 DIAGNOSIS — M419 Scoliosis, unspecified: Secondary | ICD-10-CM | POA: Diagnosis not present

## 2016-03-09 DIAGNOSIS — M545 Low back pain: Secondary | ICD-10-CM | POA: Diagnosis not present

## 2016-03-15 DIAGNOSIS — M419 Scoliosis, unspecified: Secondary | ICD-10-CM | POA: Diagnosis not present

## 2016-03-15 DIAGNOSIS — M6281 Muscle weakness (generalized): Secondary | ICD-10-CM | POA: Diagnosis not present

## 2016-03-15 DIAGNOSIS — M545 Low back pain: Secondary | ICD-10-CM | POA: Diagnosis not present

## 2016-03-20 DIAGNOSIS — M419 Scoliosis, unspecified: Secondary | ICD-10-CM | POA: Diagnosis not present

## 2016-03-20 DIAGNOSIS — M6281 Muscle weakness (generalized): Secondary | ICD-10-CM | POA: Diagnosis not present

## 2016-03-20 DIAGNOSIS — M545 Low back pain: Secondary | ICD-10-CM | POA: Diagnosis not present

## 2016-03-23 DIAGNOSIS — M545 Low back pain: Secondary | ICD-10-CM | POA: Diagnosis not present

## 2016-03-23 DIAGNOSIS — M6281 Muscle weakness (generalized): Secondary | ICD-10-CM | POA: Diagnosis not present

## 2016-03-23 DIAGNOSIS — M419 Scoliosis, unspecified: Secondary | ICD-10-CM | POA: Diagnosis not present

## 2016-03-27 DIAGNOSIS — M419 Scoliosis, unspecified: Secondary | ICD-10-CM | POA: Diagnosis not present

## 2016-03-27 DIAGNOSIS — M6281 Muscle weakness (generalized): Secondary | ICD-10-CM | POA: Diagnosis not present

## 2016-03-27 DIAGNOSIS — M545 Low back pain: Secondary | ICD-10-CM | POA: Diagnosis not present

## 2016-03-30 DIAGNOSIS — M6281 Muscle weakness (generalized): Secondary | ICD-10-CM | POA: Diagnosis not present

## 2016-03-30 DIAGNOSIS — M419 Scoliosis, unspecified: Secondary | ICD-10-CM | POA: Diagnosis not present

## 2016-03-30 DIAGNOSIS — M545 Low back pain: Secondary | ICD-10-CM | POA: Diagnosis not present

## 2016-04-03 DIAGNOSIS — M6281 Muscle weakness (generalized): Secondary | ICD-10-CM | POA: Diagnosis not present

## 2016-04-03 DIAGNOSIS — M419 Scoliosis, unspecified: Secondary | ICD-10-CM | POA: Diagnosis not present

## 2016-04-03 DIAGNOSIS — M545 Low back pain: Secondary | ICD-10-CM | POA: Diagnosis not present

## 2016-04-04 DIAGNOSIS — M791 Myalgia: Secondary | ICD-10-CM | POA: Diagnosis not present

## 2016-04-04 DIAGNOSIS — M4325 Fusion of spine, thoracolumbar region: Secondary | ICD-10-CM | POA: Diagnosis not present

## 2016-04-06 DIAGNOSIS — M6281 Muscle weakness (generalized): Secondary | ICD-10-CM | POA: Diagnosis not present

## 2016-04-06 DIAGNOSIS — M545 Low back pain: Secondary | ICD-10-CM | POA: Diagnosis not present

## 2016-04-06 DIAGNOSIS — M419 Scoliosis, unspecified: Secondary | ICD-10-CM | POA: Diagnosis not present

## 2016-04-11 DIAGNOSIS — M419 Scoliosis, unspecified: Secondary | ICD-10-CM | POA: Diagnosis not present

## 2016-04-11 DIAGNOSIS — M6281 Muscle weakness (generalized): Secondary | ICD-10-CM | POA: Diagnosis not present

## 2016-04-11 DIAGNOSIS — M545 Low back pain: Secondary | ICD-10-CM | POA: Diagnosis not present

## 2016-04-13 DIAGNOSIS — M6281 Muscle weakness (generalized): Secondary | ICD-10-CM | POA: Diagnosis not present

## 2016-04-13 DIAGNOSIS — M419 Scoliosis, unspecified: Secondary | ICD-10-CM | POA: Diagnosis not present

## 2016-04-13 DIAGNOSIS — M545 Low back pain: Secondary | ICD-10-CM | POA: Diagnosis not present

## 2016-04-17 DIAGNOSIS — M419 Scoliosis, unspecified: Secondary | ICD-10-CM | POA: Diagnosis not present

## 2016-04-17 DIAGNOSIS — M545 Low back pain: Secondary | ICD-10-CM | POA: Diagnosis not present

## 2016-04-17 DIAGNOSIS — M6281 Muscle weakness (generalized): Secondary | ICD-10-CM | POA: Diagnosis not present

## 2016-04-20 DIAGNOSIS — M419 Scoliosis, unspecified: Secondary | ICD-10-CM | POA: Diagnosis not present

## 2016-04-20 DIAGNOSIS — M545 Low back pain: Secondary | ICD-10-CM | POA: Diagnosis not present

## 2016-04-20 DIAGNOSIS — M6281 Muscle weakness (generalized): Secondary | ICD-10-CM | POA: Diagnosis not present

## 2016-04-24 DIAGNOSIS — M6281 Muscle weakness (generalized): Secondary | ICD-10-CM | POA: Diagnosis not present

## 2016-04-24 DIAGNOSIS — M545 Low back pain: Secondary | ICD-10-CM | POA: Diagnosis not present

## 2016-04-24 DIAGNOSIS — M419 Scoliosis, unspecified: Secondary | ICD-10-CM | POA: Diagnosis not present

## 2016-04-27 DIAGNOSIS — M545 Low back pain: Secondary | ICD-10-CM | POA: Diagnosis not present

## 2016-04-27 DIAGNOSIS — M6281 Muscle weakness (generalized): Secondary | ICD-10-CM | POA: Diagnosis not present

## 2016-04-27 DIAGNOSIS — M419 Scoliosis, unspecified: Secondary | ICD-10-CM | POA: Diagnosis not present

## 2016-05-01 DIAGNOSIS — M6281 Muscle weakness (generalized): Secondary | ICD-10-CM | POA: Diagnosis not present

## 2016-05-01 DIAGNOSIS — M545 Low back pain: Secondary | ICD-10-CM | POA: Diagnosis not present

## 2016-05-01 DIAGNOSIS — M419 Scoliosis, unspecified: Secondary | ICD-10-CM | POA: Diagnosis not present

## 2016-05-04 DIAGNOSIS — M545 Low back pain: Secondary | ICD-10-CM | POA: Diagnosis not present

## 2016-05-04 DIAGNOSIS — M419 Scoliosis, unspecified: Secondary | ICD-10-CM | POA: Diagnosis not present

## 2016-05-04 DIAGNOSIS — M6281 Muscle weakness (generalized): Secondary | ICD-10-CM | POA: Diagnosis not present

## 2016-05-10 DIAGNOSIS — M419 Scoliosis, unspecified: Secondary | ICD-10-CM | POA: Diagnosis not present

## 2016-05-10 DIAGNOSIS — M545 Low back pain: Secondary | ICD-10-CM | POA: Diagnosis not present

## 2016-05-10 DIAGNOSIS — M6281 Muscle weakness (generalized): Secondary | ICD-10-CM | POA: Diagnosis not present

## 2016-05-15 DIAGNOSIS — I10 Essential (primary) hypertension: Secondary | ICD-10-CM | POA: Diagnosis not present

## 2016-05-15 DIAGNOSIS — E7800 Pure hypercholesterolemia, unspecified: Secondary | ICD-10-CM | POA: Diagnosis not present

## 2016-05-15 DIAGNOSIS — M19041 Primary osteoarthritis, right hand: Secondary | ICD-10-CM | POA: Diagnosis not present

## 2016-05-17 DIAGNOSIS — M419 Scoliosis, unspecified: Secondary | ICD-10-CM | POA: Diagnosis not present

## 2016-05-17 DIAGNOSIS — M6281 Muscle weakness (generalized): Secondary | ICD-10-CM | POA: Diagnosis not present

## 2016-05-17 DIAGNOSIS — M545 Low back pain: Secondary | ICD-10-CM | POA: Diagnosis not present

## 2016-05-23 DIAGNOSIS — M419 Scoliosis, unspecified: Secondary | ICD-10-CM | POA: Diagnosis not present

## 2016-05-23 DIAGNOSIS — M6281 Muscle weakness (generalized): Secondary | ICD-10-CM | POA: Diagnosis not present

## 2016-05-23 DIAGNOSIS — M545 Low back pain: Secondary | ICD-10-CM | POA: Diagnosis not present

## 2016-05-31 DIAGNOSIS — M545 Low back pain: Secondary | ICD-10-CM | POA: Diagnosis not present

## 2016-05-31 DIAGNOSIS — M419 Scoliosis, unspecified: Secondary | ICD-10-CM | POA: Diagnosis not present

## 2016-05-31 DIAGNOSIS — M6281 Muscle weakness (generalized): Secondary | ICD-10-CM | POA: Diagnosis not present

## 2016-06-07 DIAGNOSIS — M79643 Pain in unspecified hand: Secondary | ICD-10-CM | POA: Diagnosis not present

## 2016-06-07 DIAGNOSIS — I7300 Raynaud's syndrome without gangrene: Secondary | ICD-10-CM | POA: Diagnosis not present

## 2016-06-07 DIAGNOSIS — H04129 Dry eye syndrome of unspecified lacrimal gland: Secondary | ICD-10-CM | POA: Diagnosis not present

## 2016-06-07 DIAGNOSIS — M2560 Stiffness of unspecified joint, not elsewhere classified: Secondary | ICD-10-CM | POA: Diagnosis not present

## 2016-06-07 DIAGNOSIS — M19041 Primary osteoarthritis, right hand: Secondary | ICD-10-CM | POA: Diagnosis not present

## 2016-06-07 DIAGNOSIS — M19042 Primary osteoarthritis, left hand: Secondary | ICD-10-CM | POA: Diagnosis not present

## 2016-06-19 DIAGNOSIS — M2560 Stiffness of unspecified joint, not elsewhere classified: Secondary | ICD-10-CM | POA: Diagnosis not present

## 2016-06-19 DIAGNOSIS — M79643 Pain in unspecified hand: Secondary | ICD-10-CM | POA: Diagnosis not present

## 2016-06-19 DIAGNOSIS — I7300 Raynaud's syndrome without gangrene: Secondary | ICD-10-CM | POA: Diagnosis not present

## 2016-06-19 DIAGNOSIS — H04129 Dry eye syndrome of unspecified lacrimal gland: Secondary | ICD-10-CM | POA: Diagnosis not present

## 2016-07-17 DIAGNOSIS — F4321 Adjustment disorder with depressed mood: Secondary | ICD-10-CM | POA: Diagnosis not present

## 2016-08-17 ENCOUNTER — Other Ambulatory Visit: Payer: Self-pay | Admitting: Family Medicine

## 2016-08-17 DIAGNOSIS — Z1231 Encounter for screening mammogram for malignant neoplasm of breast: Secondary | ICD-10-CM

## 2016-08-24 DIAGNOSIS — K5901 Slow transit constipation: Secondary | ICD-10-CM | POA: Diagnosis not present

## 2016-08-24 DIAGNOSIS — Z8371 Family history of colonic polyps: Secondary | ICD-10-CM | POA: Diagnosis not present

## 2016-09-08 ENCOUNTER — Ambulatory Visit
Admission: RE | Admit: 2016-09-08 | Discharge: 2016-09-08 | Disposition: A | Discharge status: Home / Self Care | Payer: Medicare Other | Source: Ambulatory Visit | Attending: Family Medicine | Admitting: Family Medicine

## 2016-09-08 DIAGNOSIS — Z1231 Encounter for screening mammogram for malignant neoplasm of breast: Secondary | ICD-10-CM

## 2016-09-09 DIAGNOSIS — T63461A Toxic effect of venom of wasps, accidental (unintentional), initial encounter: Secondary | ICD-10-CM | POA: Diagnosis not present

## 2016-09-18 DIAGNOSIS — Z1211 Encounter for screening for malignant neoplasm of colon: Secondary | ICD-10-CM | POA: Diagnosis not present

## 2016-09-18 DIAGNOSIS — K5730 Diverticulosis of large intestine without perforation or abscess without bleeding: Secondary | ICD-10-CM | POA: Diagnosis not present

## 2016-09-18 DIAGNOSIS — D126 Benign neoplasm of colon, unspecified: Secondary | ICD-10-CM | POA: Diagnosis not present

## 2016-09-18 DIAGNOSIS — Z8371 Family history of colonic polyps: Secondary | ICD-10-CM | POA: Diagnosis not present

## 2016-09-19 DIAGNOSIS — F4321 Adjustment disorder with depressed mood: Secondary | ICD-10-CM | POA: Diagnosis not present

## 2016-09-20 DIAGNOSIS — D126 Benign neoplasm of colon, unspecified: Secondary | ICD-10-CM | POA: Diagnosis not present

## 2016-10-05 DIAGNOSIS — M545 Low back pain: Secondary | ICD-10-CM | POA: Diagnosis not present

## 2016-10-05 DIAGNOSIS — M4325 Fusion of spine, thoracolumbar region: Secondary | ICD-10-CM | POA: Diagnosis not present

## 2016-10-05 DIAGNOSIS — M791 Myalgia: Secondary | ICD-10-CM | POA: Diagnosis not present

## 2016-10-05 DIAGNOSIS — M1611 Unilateral primary osteoarthritis, right hip: Secondary | ICD-10-CM | POA: Diagnosis not present

## 2016-10-11 DIAGNOSIS — S67191A Crushing injury of left index finger, initial encounter: Secondary | ICD-10-CM | POA: Diagnosis not present

## 2016-10-11 DIAGNOSIS — M79645 Pain in left finger(s): Secondary | ICD-10-CM | POA: Diagnosis not present

## 2016-10-11 DIAGNOSIS — S62661A Nondisplaced fracture of distal phalanx of left index finger, initial encounter for closed fracture: Secondary | ICD-10-CM | POA: Diagnosis not present

## 2016-10-11 DIAGNOSIS — S61211A Laceration without foreign body of left index finger without damage to nail, initial encounter: Secondary | ICD-10-CM | POA: Diagnosis not present

## 2016-10-19 DIAGNOSIS — M25551 Pain in right hip: Secondary | ICD-10-CM | POA: Diagnosis not present

## 2016-10-19 DIAGNOSIS — M25552 Pain in left hip: Secondary | ICD-10-CM | POA: Diagnosis not present

## 2016-10-19 DIAGNOSIS — M545 Low back pain: Secondary | ICD-10-CM | POA: Diagnosis not present

## 2016-10-19 DIAGNOSIS — M1611 Unilateral primary osteoarthritis, right hip: Secondary | ICD-10-CM | POA: Diagnosis not present

## 2016-10-26 DIAGNOSIS — M1611 Unilateral primary osteoarthritis, right hip: Secondary | ICD-10-CM | POA: Diagnosis not present

## 2016-10-26 DIAGNOSIS — M545 Low back pain: Secondary | ICD-10-CM | POA: Diagnosis not present

## 2016-10-26 DIAGNOSIS — G8929 Other chronic pain: Secondary | ICD-10-CM | POA: Diagnosis not present

## 2016-10-26 DIAGNOSIS — M25551 Pain in right hip: Secondary | ICD-10-CM | POA: Diagnosis not present

## 2016-10-27 DIAGNOSIS — T8489XA Other specified complication of internal orthopedic prosthetic devices, implants and grafts, initial encounter: Secondary | ICD-10-CM | POA: Diagnosis not present

## 2016-10-27 DIAGNOSIS — M791 Myalgia, unspecified site: Secondary | ICD-10-CM | POA: Diagnosis not present

## 2016-10-27 DIAGNOSIS — Z6827 Body mass index (BMI) 27.0-27.9, adult: Secondary | ICD-10-CM | POA: Diagnosis not present

## 2016-10-27 DIAGNOSIS — M4325 Fusion of spine, thoracolumbar region: Secondary | ICD-10-CM | POA: Diagnosis not present

## 2016-11-03 DIAGNOSIS — M5135 Other intervertebral disc degeneration, thoracolumbar region: Secondary | ICD-10-CM | POA: Diagnosis not present

## 2016-11-03 DIAGNOSIS — M4325 Fusion of spine, thoracolumbar region: Secondary | ICD-10-CM | POA: Diagnosis not present

## 2016-11-09 DIAGNOSIS — Z6827 Body mass index (BMI) 27.0-27.9, adult: Secondary | ICD-10-CM | POA: Diagnosis not present

## 2016-11-09 DIAGNOSIS — M4325 Fusion of spine, thoracolumbar region: Secondary | ICD-10-CM | POA: Diagnosis not present

## 2016-11-09 DIAGNOSIS — M791 Myalgia, unspecified site: Secondary | ICD-10-CM | POA: Diagnosis not present

## 2016-11-09 DIAGNOSIS — M4125 Other idiopathic scoliosis, thoracolumbar region: Secondary | ICD-10-CM | POA: Diagnosis not present

## 2016-11-17 DIAGNOSIS — M791 Myalgia, unspecified site: Secondary | ICD-10-CM | POA: Diagnosis not present

## 2016-11-17 DIAGNOSIS — M4325 Fusion of spine, thoracolumbar region: Secondary | ICD-10-CM | POA: Diagnosis not present

## 2016-11-17 DIAGNOSIS — M1611 Unilateral primary osteoarthritis, right hip: Secondary | ICD-10-CM | POA: Diagnosis not present

## 2016-12-06 DIAGNOSIS — Z0000 Encounter for general adult medical examination without abnormal findings: Secondary | ICD-10-CM | POA: Diagnosis not present

## 2016-12-06 DIAGNOSIS — R32 Unspecified urinary incontinence: Secondary | ICD-10-CM | POA: Diagnosis not present

## 2016-12-06 DIAGNOSIS — E7800 Pure hypercholesterolemia, unspecified: Secondary | ICD-10-CM | POA: Diagnosis not present

## 2016-12-06 DIAGNOSIS — I10 Essential (primary) hypertension: Secondary | ICD-10-CM | POA: Diagnosis not present

## 2016-12-06 DIAGNOSIS — M8580 Other specified disorders of bone density and structure, unspecified site: Secondary | ICD-10-CM | POA: Diagnosis not present

## 2016-12-06 DIAGNOSIS — E559 Vitamin D deficiency, unspecified: Secondary | ICD-10-CM | POA: Diagnosis not present

## 2016-12-06 DIAGNOSIS — E669 Obesity, unspecified: Secondary | ICD-10-CM | POA: Diagnosis not present

## 2016-12-06 DIAGNOSIS — F325 Major depressive disorder, single episode, in full remission: Secondary | ICD-10-CM | POA: Diagnosis not present

## 2016-12-06 DIAGNOSIS — Z1159 Encounter for screening for other viral diseases: Secondary | ICD-10-CM | POA: Diagnosis not present

## 2016-12-07 DIAGNOSIS — M1611 Unilateral primary osteoarthritis, right hip: Secondary | ICD-10-CM | POA: Diagnosis not present

## 2016-12-07 DIAGNOSIS — M545 Low back pain: Secondary | ICD-10-CM | POA: Diagnosis not present

## 2016-12-07 DIAGNOSIS — M4150 Other secondary scoliosis, site unspecified: Secondary | ICD-10-CM | POA: Diagnosis not present

## 2016-12-07 DIAGNOSIS — M4325 Fusion of spine, thoracolumbar region: Secondary | ICD-10-CM | POA: Diagnosis not present

## 2016-12-07 DIAGNOSIS — Z6827 Body mass index (BMI) 27.0-27.9, adult: Secondary | ICD-10-CM | POA: Diagnosis not present

## 2016-12-12 DIAGNOSIS — M8588 Other specified disorders of bone density and structure, other site: Secondary | ICD-10-CM | POA: Diagnosis not present

## 2016-12-15 DIAGNOSIS — M4125 Other idiopathic scoliosis, thoracolumbar region: Secondary | ICD-10-CM | POA: Diagnosis not present

## 2016-12-15 DIAGNOSIS — M791 Myalgia, unspecified site: Secondary | ICD-10-CM | POA: Diagnosis not present

## 2016-12-15 DIAGNOSIS — T8489XA Other specified complication of internal orthopedic prosthetic devices, implants and grafts, initial encounter: Secondary | ICD-10-CM | POA: Diagnosis not present

## 2017-01-05 DIAGNOSIS — M4325 Fusion of spine, thoracolumbar region: Secondary | ICD-10-CM | POA: Diagnosis not present

## 2017-01-05 DIAGNOSIS — M960 Pseudarthrosis after fusion or arthrodesis: Secondary | ICD-10-CM | POA: Diagnosis not present

## 2017-01-05 DIAGNOSIS — T84296A Other mechanical complication of internal fixation device of vertebrae, initial encounter: Secondary | ICD-10-CM | POA: Diagnosis not present

## 2017-01-09 HISTORY — PX: BACK SURGERY: SHX140

## 2017-01-10 DIAGNOSIS — Z9989 Dependence on other enabling machines and devices: Secondary | ICD-10-CM | POA: Diagnosis not present

## 2017-01-10 DIAGNOSIS — M48062 Spinal stenosis, lumbar region with neurogenic claudication: Secondary | ICD-10-CM | POA: Diagnosis not present

## 2017-01-10 DIAGNOSIS — G4733 Obstructive sleep apnea (adult) (pediatric): Secondary | ICD-10-CM | POA: Diagnosis not present

## 2017-01-10 DIAGNOSIS — M40209 Unspecified kyphosis, site unspecified: Secondary | ICD-10-CM | POA: Diagnosis not present

## 2017-01-10 DIAGNOSIS — M4316 Spondylolisthesis, lumbar region: Secondary | ICD-10-CM | POA: Diagnosis not present

## 2017-01-10 DIAGNOSIS — Z79899 Other long term (current) drug therapy: Secondary | ICD-10-CM | POA: Diagnosis not present

## 2017-01-10 DIAGNOSIS — Z01812 Encounter for preprocedural laboratory examination: Secondary | ICD-10-CM | POA: Diagnosis not present

## 2017-01-16 ENCOUNTER — Inpatient Hospital Stay: Admit: 2017-01-16 | Payer: Medicare Other | Admitting: Orthopedic Surgery

## 2017-01-16 DIAGNOSIS — K219 Gastro-esophageal reflux disease without esophagitis: Secondary | ICD-10-CM | POA: Diagnosis present

## 2017-01-16 DIAGNOSIS — M4316 Spondylolisthesis, lumbar region: Secondary | ICD-10-CM | POA: Diagnosis present

## 2017-01-16 DIAGNOSIS — T84296A Other mechanical complication of internal fixation device of vertebrae, initial encounter: Secondary | ICD-10-CM | POA: Diagnosis not present

## 2017-01-16 DIAGNOSIS — M4726 Other spondylosis with radiculopathy, lumbar region: Secondary | ICD-10-CM | POA: Diagnosis present

## 2017-01-16 DIAGNOSIS — M4325 Fusion of spine, thoracolumbar region: Secondary | ICD-10-CM | POA: Diagnosis not present

## 2017-01-16 DIAGNOSIS — Z9119 Patient's noncompliance with other medical treatment and regimen: Secondary | ICD-10-CM | POA: Diagnosis not present

## 2017-01-16 DIAGNOSIS — T84216A Breakdown (mechanical) of internal fixation device of vertebrae, initial encounter: Secondary | ICD-10-CM | POA: Diagnosis present

## 2017-01-16 DIAGNOSIS — F411 Generalized anxiety disorder: Secondary | ICD-10-CM | POA: Diagnosis present

## 2017-01-16 DIAGNOSIS — M4326 Fusion of spine, lumbar region: Secondary | ICD-10-CM | POA: Diagnosis not present

## 2017-01-16 DIAGNOSIS — Z9989 Dependence on other enabling machines and devices: Secondary | ICD-10-CM | POA: Diagnosis not present

## 2017-01-16 DIAGNOSIS — M960 Pseudarthrosis after fusion or arthrodesis: Secondary | ICD-10-CM | POA: Diagnosis not present

## 2017-01-16 DIAGNOSIS — G4733 Obstructive sleep apnea (adult) (pediatric): Secondary | ICD-10-CM | POA: Diagnosis present

## 2017-01-16 DIAGNOSIS — M4150 Other secondary scoliosis, site unspecified: Secondary | ICD-10-CM | POA: Diagnosis not present

## 2017-01-16 DIAGNOSIS — F329 Major depressive disorder, single episode, unspecified: Secondary | ICD-10-CM | POA: Diagnosis present

## 2017-01-16 SURGERY — ARTHROPLASTY, HIP, TOTAL, ANTERIOR APPROACH
Anesthesia: Spinal | Site: Hip | Laterality: Right

## 2017-01-22 DIAGNOSIS — M4316 Spondylolisthesis, lumbar region: Secondary | ICD-10-CM | POA: Diagnosis not present

## 2017-01-22 DIAGNOSIS — M4727 Other spondylosis with radiculopathy, lumbosacral region: Secondary | ICD-10-CM | POA: Diagnosis not present

## 2017-01-22 DIAGNOSIS — M455 Ankylosing spondylitis of thoracolumbar region: Secondary | ICD-10-CM | POA: Diagnosis not present

## 2017-01-22 DIAGNOSIS — Z981 Arthrodesis status: Secondary | ICD-10-CM | POA: Diagnosis not present

## 2017-01-22 DIAGNOSIS — Z9181 History of falling: Secondary | ICD-10-CM | POA: Diagnosis not present

## 2017-01-22 DIAGNOSIS — G4733 Obstructive sleep apnea (adult) (pediatric): Secondary | ICD-10-CM | POA: Diagnosis not present

## 2017-01-22 DIAGNOSIS — I10 Essential (primary) hypertension: Secondary | ICD-10-CM | POA: Diagnosis not present

## 2017-01-22 DIAGNOSIS — F419 Anxiety disorder, unspecified: Secondary | ICD-10-CM | POA: Diagnosis not present

## 2017-01-22 DIAGNOSIS — T84296D Other mechanical complication of internal fixation device of vertebrae, subsequent encounter: Secondary | ICD-10-CM | POA: Diagnosis not present

## 2017-01-22 DIAGNOSIS — M5416 Radiculopathy, lumbar region: Secondary | ICD-10-CM | POA: Diagnosis not present

## 2017-01-24 DIAGNOSIS — M5416 Radiculopathy, lumbar region: Secondary | ICD-10-CM | POA: Diagnosis not present

## 2017-01-24 DIAGNOSIS — M4727 Other spondylosis with radiculopathy, lumbosacral region: Secondary | ICD-10-CM | POA: Diagnosis not present

## 2017-01-24 DIAGNOSIS — I10 Essential (primary) hypertension: Secondary | ICD-10-CM | POA: Diagnosis not present

## 2017-01-24 DIAGNOSIS — T84296D Other mechanical complication of internal fixation device of vertebrae, subsequent encounter: Secondary | ICD-10-CM | POA: Diagnosis not present

## 2017-01-24 DIAGNOSIS — M455 Ankylosing spondylitis of thoracolumbar region: Secondary | ICD-10-CM | POA: Diagnosis not present

## 2017-01-24 DIAGNOSIS — M4316 Spondylolisthesis, lumbar region: Secondary | ICD-10-CM | POA: Diagnosis not present

## 2017-01-26 DIAGNOSIS — M4316 Spondylolisthesis, lumbar region: Secondary | ICD-10-CM | POA: Diagnosis not present

## 2017-01-26 DIAGNOSIS — M4727 Other spondylosis with radiculopathy, lumbosacral region: Secondary | ICD-10-CM | POA: Diagnosis not present

## 2017-01-26 DIAGNOSIS — M455 Ankylosing spondylitis of thoracolumbar region: Secondary | ICD-10-CM | POA: Diagnosis not present

## 2017-01-26 DIAGNOSIS — T84296D Other mechanical complication of internal fixation device of vertebrae, subsequent encounter: Secondary | ICD-10-CM | POA: Diagnosis not present

## 2017-01-26 DIAGNOSIS — M5416 Radiculopathy, lumbar region: Secondary | ICD-10-CM | POA: Diagnosis not present

## 2017-01-26 DIAGNOSIS — I10 Essential (primary) hypertension: Secondary | ICD-10-CM | POA: Diagnosis not present

## 2017-01-29 DIAGNOSIS — I10 Essential (primary) hypertension: Secondary | ICD-10-CM | POA: Diagnosis not present

## 2017-01-29 DIAGNOSIS — M5416 Radiculopathy, lumbar region: Secondary | ICD-10-CM | POA: Diagnosis not present

## 2017-01-29 DIAGNOSIS — T84296D Other mechanical complication of internal fixation device of vertebrae, subsequent encounter: Secondary | ICD-10-CM | POA: Diagnosis not present

## 2017-01-29 DIAGNOSIS — M4316 Spondylolisthesis, lumbar region: Secondary | ICD-10-CM | POA: Diagnosis not present

## 2017-01-29 DIAGNOSIS — M4727 Other spondylosis with radiculopathy, lumbosacral region: Secondary | ICD-10-CM | POA: Diagnosis not present

## 2017-01-29 DIAGNOSIS — M455 Ankylosing spondylitis of thoracolumbar region: Secondary | ICD-10-CM | POA: Diagnosis not present

## 2017-01-31 DIAGNOSIS — M4325 Fusion of spine, thoracolumbar region: Secondary | ICD-10-CM | POA: Diagnosis not present

## 2017-02-01 DIAGNOSIS — M4316 Spondylolisthesis, lumbar region: Secondary | ICD-10-CM | POA: Diagnosis not present

## 2017-02-01 DIAGNOSIS — M455 Ankylosing spondylitis of thoracolumbar region: Secondary | ICD-10-CM | POA: Diagnosis not present

## 2017-02-01 DIAGNOSIS — I10 Essential (primary) hypertension: Secondary | ICD-10-CM | POA: Diagnosis not present

## 2017-02-01 DIAGNOSIS — M4727 Other spondylosis with radiculopathy, lumbosacral region: Secondary | ICD-10-CM | POA: Diagnosis not present

## 2017-02-01 DIAGNOSIS — M5416 Radiculopathy, lumbar region: Secondary | ICD-10-CM | POA: Diagnosis not present

## 2017-02-01 DIAGNOSIS — T84296D Other mechanical complication of internal fixation device of vertebrae, subsequent encounter: Secondary | ICD-10-CM | POA: Diagnosis not present

## 2017-02-02 DIAGNOSIS — M4316 Spondylolisthesis, lumbar region: Secondary | ICD-10-CM | POA: Diagnosis not present

## 2017-02-02 DIAGNOSIS — M4727 Other spondylosis with radiculopathy, lumbosacral region: Secondary | ICD-10-CM | POA: Diagnosis not present

## 2017-02-02 DIAGNOSIS — T84296D Other mechanical complication of internal fixation device of vertebrae, subsequent encounter: Secondary | ICD-10-CM | POA: Diagnosis not present

## 2017-02-05 DIAGNOSIS — F4321 Adjustment disorder with depressed mood: Secondary | ICD-10-CM | POA: Diagnosis not present

## 2017-02-06 DIAGNOSIS — T84296D Other mechanical complication of internal fixation device of vertebrae, subsequent encounter: Secondary | ICD-10-CM | POA: Diagnosis not present

## 2017-02-06 DIAGNOSIS — M4727 Other spondylosis with radiculopathy, lumbosacral region: Secondary | ICD-10-CM | POA: Diagnosis not present

## 2017-02-06 DIAGNOSIS — M4316 Spondylolisthesis, lumbar region: Secondary | ICD-10-CM | POA: Diagnosis not present

## 2017-02-06 DIAGNOSIS — I10 Essential (primary) hypertension: Secondary | ICD-10-CM | POA: Diagnosis not present

## 2017-02-06 DIAGNOSIS — M5416 Radiculopathy, lumbar region: Secondary | ICD-10-CM | POA: Diagnosis not present

## 2017-02-06 DIAGNOSIS — M455 Ankylosing spondylitis of thoracolumbar region: Secondary | ICD-10-CM | POA: Diagnosis not present

## 2017-02-08 DIAGNOSIS — I10 Essential (primary) hypertension: Secondary | ICD-10-CM | POA: Diagnosis not present

## 2017-02-08 DIAGNOSIS — M4727 Other spondylosis with radiculopathy, lumbosacral region: Secondary | ICD-10-CM | POA: Diagnosis not present

## 2017-02-08 DIAGNOSIS — T84296D Other mechanical complication of internal fixation device of vertebrae, subsequent encounter: Secondary | ICD-10-CM | POA: Diagnosis not present

## 2017-02-08 DIAGNOSIS — M4316 Spondylolisthesis, lumbar region: Secondary | ICD-10-CM | POA: Diagnosis not present

## 2017-02-08 DIAGNOSIS — M455 Ankylosing spondylitis of thoracolumbar region: Secondary | ICD-10-CM | POA: Diagnosis not present

## 2017-02-08 DIAGNOSIS — M5416 Radiculopathy, lumbar region: Secondary | ICD-10-CM | POA: Diagnosis not present

## 2017-02-13 DIAGNOSIS — M455 Ankylosing spondylitis of thoracolumbar region: Secondary | ICD-10-CM | POA: Diagnosis not present

## 2017-02-13 DIAGNOSIS — M5416 Radiculopathy, lumbar region: Secondary | ICD-10-CM | POA: Diagnosis not present

## 2017-02-13 DIAGNOSIS — I10 Essential (primary) hypertension: Secondary | ICD-10-CM | POA: Diagnosis not present

## 2017-02-13 DIAGNOSIS — T84296D Other mechanical complication of internal fixation device of vertebrae, subsequent encounter: Secondary | ICD-10-CM | POA: Diagnosis not present

## 2017-02-13 DIAGNOSIS — M4316 Spondylolisthesis, lumbar region: Secondary | ICD-10-CM | POA: Diagnosis not present

## 2017-02-13 DIAGNOSIS — M4727 Other spondylosis with radiculopathy, lumbosacral region: Secondary | ICD-10-CM | POA: Diagnosis not present

## 2017-02-15 DIAGNOSIS — M4316 Spondylolisthesis, lumbar region: Secondary | ICD-10-CM | POA: Diagnosis not present

## 2017-02-15 DIAGNOSIS — M5416 Radiculopathy, lumbar region: Secondary | ICD-10-CM | POA: Diagnosis not present

## 2017-02-15 DIAGNOSIS — I10 Essential (primary) hypertension: Secondary | ICD-10-CM | POA: Diagnosis not present

## 2017-02-15 DIAGNOSIS — M4727 Other spondylosis with radiculopathy, lumbosacral region: Secondary | ICD-10-CM | POA: Diagnosis not present

## 2017-02-15 DIAGNOSIS — M455 Ankylosing spondylitis of thoracolumbar region: Secondary | ICD-10-CM | POA: Diagnosis not present

## 2017-02-15 DIAGNOSIS — T84296D Other mechanical complication of internal fixation device of vertebrae, subsequent encounter: Secondary | ICD-10-CM | POA: Diagnosis not present

## 2017-02-16 DIAGNOSIS — M4325 Fusion of spine, thoracolumbar region: Secondary | ICD-10-CM | POA: Diagnosis not present

## 2017-02-19 DIAGNOSIS — T84296D Other mechanical complication of internal fixation device of vertebrae, subsequent encounter: Secondary | ICD-10-CM | POA: Diagnosis not present

## 2017-02-19 DIAGNOSIS — M5416 Radiculopathy, lumbar region: Secondary | ICD-10-CM | POA: Diagnosis not present

## 2017-02-19 DIAGNOSIS — M4316 Spondylolisthesis, lumbar region: Secondary | ICD-10-CM | POA: Diagnosis not present

## 2017-02-19 DIAGNOSIS — I10 Essential (primary) hypertension: Secondary | ICD-10-CM | POA: Diagnosis not present

## 2017-02-19 DIAGNOSIS — M4727 Other spondylosis with radiculopathy, lumbosacral region: Secondary | ICD-10-CM | POA: Diagnosis not present

## 2017-02-19 DIAGNOSIS — M455 Ankylosing spondylitis of thoracolumbar region: Secondary | ICD-10-CM | POA: Diagnosis not present

## 2017-03-06 DIAGNOSIS — M4325 Fusion of spine, thoracolumbar region: Secondary | ICD-10-CM | POA: Diagnosis not present

## 2017-03-07 DIAGNOSIS — M4325 Fusion of spine, thoracolumbar region: Secondary | ICD-10-CM | POA: Diagnosis not present

## 2017-03-07 DIAGNOSIS — M545 Low back pain: Secondary | ICD-10-CM | POA: Diagnosis not present

## 2017-03-14 DIAGNOSIS — S63502A Unspecified sprain of left wrist, initial encounter: Secondary | ICD-10-CM | POA: Diagnosis not present

## 2017-03-14 DIAGNOSIS — W19XXXA Unspecified fall, initial encounter: Secondary | ICD-10-CM | POA: Diagnosis not present

## 2017-03-20 DIAGNOSIS — M4325 Fusion of spine, thoracolumbar region: Secondary | ICD-10-CM | POA: Diagnosis not present

## 2017-03-21 DIAGNOSIS — M545 Low back pain: Secondary | ICD-10-CM | POA: Diagnosis not present

## 2017-03-27 DIAGNOSIS — M4325 Fusion of spine, thoracolumbar region: Secondary | ICD-10-CM | POA: Diagnosis not present

## 2017-03-28 DIAGNOSIS — M25551 Pain in right hip: Secondary | ICD-10-CM | POA: Diagnosis not present

## 2017-03-28 DIAGNOSIS — M1611 Unilateral primary osteoarthritis, right hip: Secondary | ICD-10-CM | POA: Diagnosis not present

## 2017-04-03 DIAGNOSIS — M4325 Fusion of spine, thoracolumbar region: Secondary | ICD-10-CM | POA: Diagnosis not present

## 2017-04-10 DIAGNOSIS — M4325 Fusion of spine, thoracolumbar region: Secondary | ICD-10-CM | POA: Diagnosis not present

## 2017-04-17 DIAGNOSIS — M4325 Fusion of spine, thoracolumbar region: Secondary | ICD-10-CM | POA: Diagnosis not present

## 2017-04-20 DIAGNOSIS — M4325 Fusion of spine, thoracolumbar region: Secondary | ICD-10-CM | POA: Diagnosis not present

## 2017-04-20 NOTE — Progress Notes (Signed)
Need orders in epic.  Preop on 04/25/2017.  Thanks.

## 2017-04-23 NOTE — Progress Notes (Signed)
Preop on 04/25/2017.  Need orders in epic

## 2017-04-24 ENCOUNTER — Other Ambulatory Visit (HOSPITAL_COMMUNITY): Payer: Self-pay | Admitting: Emergency Medicine

## 2017-04-24 DIAGNOSIS — M4325 Fusion of spine, thoracolumbar region: Secondary | ICD-10-CM | POA: Diagnosis not present

## 2017-04-24 NOTE — Patient Instructions (Addendum)
[name]  04/24/2017   Your procedure is scheduled on: 05-08-17   Report to [facility] Main  Entrance    Report to admitting at 9:00AM    Call this number if you have problems the morning of surgery [phone]     Remember: Do not eat food or drink liquids :After Midnight.    PLEASE BRING CPAP MASK AND TUBING TO [place]. DEVICE WILL BE PROVIDED!   Take these medicines the morning of surgery with A SIP OF WATER: BUPROPION, ZYRTEC                                You may not have any metal on your body including hair pins and              piercings  Do not wear jewelry, make-up, lotions, powders or perfumes, deodorant             Do not wear nail polish.  Do not shave  48 hours prior to surgery.     Do not bring valuables to the hospital. [place].  Contacts, dentures or bridgework may not be worn into surgery.  Leave suitcase in the car. After surgery it may be brought to your room.               Please read over the following fact sheets you were given: _____________________________________________________________________             [facility] - Preparing for Surgery Before surgery, you can play an important role.  Because skin is not sterile, your skin needs to be as free of germs as possible.  You can reduce the number of germs on your skin by washing with CHG (chlorahexidine gluconate) soap before surgery.  CHG is an antiseptic cleaner which kills germs and bonds with the skin to continue killing germs even after washing. Please DO NOT use if you have an allergy to CHG or antibacterial soaps.  If your skin becomes reddened/irritated stop using the CHG and inform your nurse when you arrive at Short Stay. Do not shave (including legs and underarms) for at least 48 hours prior to the first CHG shower.  You may shave your face/neck. Please follow these instructions carefully:  1.  Shower  with CHG Soap the night before surgery and the  morning of Surgery.  2.  If you choose to wash your hair, wash your hair first as usual with your  normal  shampoo.  3.  After you shampoo, rinse your hair and body thoroughly to remove the  shampoo.                           4.  Use CHG as you would any other liquid soap.  You can apply chg directly  to the skin and wash                       Gently with a scrungie or clean washcloth.  5.  Apply the CHG Soap to your body ONLY FROM THE NECK DOWN.   Do not use on face/ open  Wound or open sores. Avoid contact with eyes, ears mouth and genitals (private parts).                       Wash face,  Genitals (private parts) with your normal soap.             6.  Wash thoroughly, paying special attention to the area where your surgery  will be performed.  7.  Thoroughly rinse your body with warm water from the neck down.  8.  DO NOT shower/wash with your normal soap after using and rinsing off  the CHG Soap.                9.  Pat yourself dry with a clean towel.            10.  Wear clean pajamas.            11.  Place clean sheets on your bed the night of your first shower and do not  sleep with pets. Day of Surgery : Do not apply any lotions/deodorants the morning of surgery.  Please wear clean clothes to the hospital/surgery center.  FAILURE TO FOLLOW THESE INSTRUCTIONS MAY RESULT IN THE CANCELLATION OF YOUR SURGERY PATIENT SIGNATURE_________________________________  NURSE SIGNATURE__________________________________  ________________________________________________________________________   Adam Phenix  An incentive spirometer is a tool that can help keep your lungs clear and active. This tool measures how well you are filling your lungs with each breath. Taking long deep breaths may help reverse or decrease the chance of developing breathing (pulmonary) problems (especially infection) following:  A long period  of time when you are unable to move or be active. BEFORE THE PROCEDURE   If the spirometer includes an indicator to show your best effort, your nurse or respiratory therapist will set it to a desired goal.  If possible, sit up straight or lean slightly forward. Try not to slouch.  Hold the incentive spirometer in an upright position. INSTRUCTIONS FOR USE  1. Sit on the edge of your bed if possible, or sit up as far as you can in bed or on a chair. 2. Hold the incentive spirometer in an upright position. 3. Breathe out normally. 4. Place the mouthpiece in your mouth and seal your lips tightly around it. 5. Breathe in slowly and as deeply as possible, raising the piston or the ball toward the top of the column. 6. Hold your breath for 3-5 seconds or for as long as possible. Allow the piston or ball to fall to the bottom of the column. 7. Remove the mouthpiece from your mouth and breathe out normally. 8. Rest for a few seconds and repeat Steps 1 through 7 at least 10 times every 1-2 hours when you are awake. Take your time and take a few normal breaths between deep breaths. 9. The spirometer may include an indicator to show your best effort. Use the indicator as a goal to work toward during each repetition. 10. After each set of 10 deep breaths, practice coughing to be sure your lungs are clear. If you have an incision (the cut made at the time of surgery), support your incision when coughing by placing a pillow or rolled up towels firmly against it. Once you are able to get out of bed, walk around indoors and cough well. You may stop using the incentive spirometer when instructed by your caregiver.  RISKS AND COMPLICATIONS  Take your time so you do not get  dizzy or light-headed.  If you are in pain, you may need to take or ask for pain medication before doing incentive spirometry. It is harder to take a deep breath if you are having pain. AFTER USE  Rest and breathe slowly and easily.  It  can be helpful to keep track of a log of your progress. Your caregiver can provide you with a simple table to help with this. If you are using the spirometer at home, follow these instructions: [place] IF:   You are having difficultly using the spirometer.  You have trouble using the spirometer as often as instructed.  Your pain medication is not giving enough relief while using the spirometer.  You develop fever of 100.5 F (38.1 C) or higher. SEEK IMMEDIATE MEDICAL CARE IF:   You cough up bloody sputum that had not been present before.  You develop fever of 102 F (38.9 C) or greater.  You develop worsening pain at or near the incision site. MAKE SURE YOU:   Understand these instructions.  Will watch your condition.  Will get help right away if you are not doing well or get worse. Document Released: 05/08/2006 Document Revised: 03/20/2011 Document Reviewed: 07/09/2006 Exit[employer] Patient Information 2014 [employer], [place].   ________________________________________________________________________  WHAT IS A BLOOD TRANSFUSION? Blood Transfusion Information  A transfusion is the replacement of blood or some of its parts. Blood is made up of multiple cells which provide different functions.  Red blood cells carry oxygen and are used for blood loss replacement.  White blood cells fight against infection.  Platelets control bleeding.  Plasma helps clot blood.  Other blood products are available for specialized needs, such as hemophilia or other clotting disorders. BEFORE THE TRANSFUSION  Who gives blood for transfusions?   Healthy volunteers who are fully evaluated to make sure their blood is safe. This is blood bank blood. Transfusion therapy is the safest it has ever been in the practice of medicine. Before blood is taken from a donor, a complete history is taken to make sure that person has no history of diseases nor engages in risky social behavior (examples  are intravenous drug use or sexual activity with multiple partners). The donor's travel history is screened to minimize risk of transmitting infections, such as malaria. The donated blood is tested for signs of infectious diseases, such as HIV and hepatitis. The blood is then tested to be sure it is compatible with you in order to minimize the chance of a transfusion reaction. If you or a relative donates blood, this is often done in anticipation of surgery and is not appropriate for emergency situations. It takes many days to process the donated blood. RISKS AND COMPLICATIONS Although transfusion therapy is very safe and saves many lives, the main dangers of transfusion include:   Getting an infectious disease.  Developing a transfusion reaction. This is an allergic reaction to something in the blood you were given. Every precaution is taken to prevent this. The decision to have a blood transfusion has been considered carefully by your caregiver before blood is given. Blood is not given unless the benefits outweigh the risks. AFTER THE TRANSFUSION  Right after receiving a blood transfusion, you will usually feel much better and more energetic. This is especially true if your red blood cells have gotten low (anemic). The transfusion raises the level of the red blood cells which carry oxygen, and this usually causes an energy increase.  The nurse administering the transfusion will  monitor you carefully for complications. HOME CARE INSTRUCTIONS  No special instructions are needed after a transfusion. You may find your energy is better. Speak with your caregiver about any limitations on activity for underlying diseases you may have. SEEK MEDICAL CARE IF:   Your condition is not improving after your transfusion.  You develop redness or irritation at the intravenous (IV) site. SEEK IMMEDIATE MEDICAL CARE IF:  Any of the following symptoms occur over the next 12 hours:  Shaking chills.  You have a  temperature by mouth above 102 F (38.9 C), not controlled by medicine.  Chest, back, or muscle pain.  People around you feel you are not acting correctly or are confused.  Shortness of breath or difficulty breathing.  Dizziness and fainting.  You get a rash or develop hives.  You have a decrease in urine output.  Your urine turns a dark color or changes to pink, red, or brown. Any of the following symptoms occur over the next 10 days:  You have a temperature by mouth above 102 F (38.9 C), not controlled by medicine.  Shortness of breath.  Weakness after normal activity.  The white part of the eye turns yellow (jaundice).  You have a decrease in the amount of urine or are urinating less often.  Your urine turns a dark color or changes to pink, red, or brown. Document Released: 12/24/1999 Document Revised: 03/20/2011 Document Reviewed: 08/12/2007 [facility] Patient Information 2014 [place], [place].  _______________________________________________________________________

## 2017-04-25 ENCOUNTER — Encounter (HOSPITAL_COMMUNITY): Payer: Self-pay

## 2017-04-25 ENCOUNTER — Other Ambulatory Visit: Payer: Self-pay

## 2017-04-25 ENCOUNTER — Encounter (HOSPITAL_COMMUNITY)
Admission: RE | Admit: 2017-04-25 | Discharge: 2017-04-25 | Disposition: A | Discharge status: Home / Self Care | Payer: Medicare Other | Source: Ambulatory Visit | Attending: Orthopedic Surgery | Admitting: Orthopedic Surgery

## 2017-04-25 DIAGNOSIS — I10 Essential (primary) hypertension: Secondary | ICD-10-CM | POA: Diagnosis not present

## 2017-04-25 DIAGNOSIS — M1611 Unilateral primary osteoarthritis, right hip: Secondary | ICD-10-CM | POA: Diagnosis not present

## 2017-04-25 DIAGNOSIS — M25561 Pain in right knee: Secondary | ICD-10-CM | POA: Diagnosis not present

## 2017-04-25 DIAGNOSIS — Z01818 Encounter for other preprocedural examination: Secondary | ICD-10-CM | POA: Insufficient documentation

## 2017-04-25 DIAGNOSIS — I4510 Unspecified right bundle-branch block: Secondary | ICD-10-CM | POA: Diagnosis not present

## 2017-04-25 DIAGNOSIS — Z01812 Encounter for preprocedural laboratory examination: Secondary | ICD-10-CM | POA: Insufficient documentation

## 2017-04-25 HISTORY — DX: Sleep disorder, unspecified: G47.9

## 2017-04-25 HISTORY — DX: Essential (primary) hypertension: I10

## 2017-04-25 LAB — BASIC METABOLIC PANEL
Anion gap: 12 (ref 5–15)
BUN: 14 mg/dL (ref 6–20)
CHLORIDE: 105 mmol/L (ref 101–111)
CO2: 24 mmol/L (ref 22–32)
Calcium: 9.3 mg/dL (ref 8.9–10.3)
Creatinine, Ser: 0.77 mg/dL (ref 0.44–1.00)
GFR calc Af Amer: >60 mL/min (ref >60)
GFR calc non Af Amer: >60 mL/min (ref >60)
Glucose, Bld: 89 mg/dL (ref 65–99)
Potassium: 4.2 mmol/L (ref 3.5–5.1)
SODIUM: 141 mmol/L (ref 135–145)

## 2017-04-25 LAB — CBC
HEMATOCRIT: 43.4 % (ref 36.0–46.0)
HEMOGLOBIN: 14.1 g/dL (ref 12.0–15.0)
MCH: 29 pg (ref 26.0–34.0)
MCHC: 32.5 g/dL (ref 30.0–36.0)
MCV: 89.3 fL (ref 78.0–100.0)
Platelets: 258 10*3/uL (ref 150–400)
RBC: 4.86 MIL/uL (ref 3.87–5.11)
RDW: 13.3 % (ref 11.5–15.5)
WBC: 6.4 10*3/uL (ref 4.0–10.5)

## 2017-04-25 LAB — SURGICAL PCR SCREEN
MRSA, PCR: NEGATIVE
STAPHYLOCOCCUS AUREUS: NEGATIVE

## 2017-04-30 NOTE — H&P (Signed)
TOTAL HIP ADMISSION H&P  Patient is admitted for right total hip arthroplasty, anterior approach.  Subjective:  Chief Complaint:   Right hip primary OA / pain  HPI: [name], 72 y.o. female, has a history of pain and functional disability in the right hip(s) due to arthritis and patient has failed non-surgical conservative treatments for greater than 12 weeks to include NSAID's and/or analgesics, use of assistive devices and activity modification.  Onset of symptoms was gradual starting ~2 years ago with gradually worsening course since that time.The patient noted no past surgery on the right hip(s).  Patient currently rates pain in the right hip at 9 out of 10 with activity. Patient has night pain, worsening of pain with activity and weight bearing, trendelenberg gait, pain that interfers with activities of daily living and pain with passive range of motion. Patient has evidence of periarticular osteophytes and joint space narrowing by imaging studies. This condition presents safety issues increasing the risk of falls.  There is no current active infection.  Risks, benefits and expectations were discussed with the patient.  Risks including but not limited to the risk of anesthesia, blood clots, nerve damage, blood vessel damage, failure of the prosthesis, infection and up to and including death.  Patient understand the risks, benefits and expectations and wishes to proceed with surgery.   PCP: Darcus Austin, MD  D/C Plans:       Home   Post-op Meds:       No Rx given   Tranexamic Acid:      To be given - IV   Decadron:      Is to be given  FYI:      ASA  Oxycodone  DME:   Pt already has equipment  PT:   No PT    Patient Active Problem List   Diagnosis Date Noted  . Chronic bilateral low back pain without sciatica 04/05/2015  . Disorder of sacroiliac joint 12/22/2014  . Chronic low back pain 12/22/2014  . Spinal stenosis, lumbar region, with neurogenic claudication 10/06/2013   . Acquired scoliosis 11/27/2011  . Lumbosacral spondylosis without myelopathy 05/22/2011   Past Medical History:  Diagnosis Date  . Congenital musculoskeletal deformity of spine   . Hypertension   . Incontinence of bowel 09/2013  . Lumbago   . Lumbosacral spondylosis without myelopathy   . Sleep disorder    uses gabapentin at bedtime   . Spinal stenosis, lumbar region, without neurogenic claudication     Past Surgical History:  Procedure Laterality Date  . ANKLE SURGERY Right 2015   lengthened the achilles   . AUGMENTATION MAMMAPLASTY Bilateral 2000 or 2001 unsure    Left Implant has busted and is no longer visible  . BACK SURGERY  2015   fusion , Dr Patrice Paradise at [facility]; has 2 metal rods in place , fusion from t2 to s1   . BACK SURGERY  01/2017   Dr Rennis Harding ; repair of cracked titanium rod in back lumbar   . bunionectomy  Bilateral 1980s   multiple   . COLONOSCOPY     with polypectomy ; q 5 years   . FOOT SURGERY  07/2011  . SHOULDER SURGERY Left 2013   rotator cuff     No current facility-administered medications for this encounter.    Current Outpatient Medications  Medication Sig Dispense Refill Last Dose  . buPROPion (WELLBUTRIN XL) 150 MG 24 hr tablet Take 150 mg by mouth daily.  Taking  . cetirizine (ZYRTEC) 10 MG tablet Take 10 mg by mouth daily as needed for allergies.     . clonazePAM (KLONOPIN) 0.5 MG tablet Take 1 mg by mouth at bedtime.   3 Taking  . fluticasone (FLONASE) 50 MCG/ACT nasal spray Place 1 spray into both nostrils at bedtime as needed for allergies or rhinitis.     Marland Kitchen gabapentin (NEURONTIN) 600 MG tablet Take 1 tablet (600 mg total) by mouth daily. 3 tabs QHS (Patient taking differently: Take 1,800-2,400 mg by mouth at bedtime. ) 3 tablet 1 Taking  . losartan (COZAAR) 100 MG tablet Take 50 mg by mouth daily. Patient reports that she now takes 1/2 of tablet, or 50mg  once a day ; reports her PCP Dr Inda Merlin is aware of this   Taking  .  Menthol, Topical Analgesic, (BIOFREEZE EX) Apply 1 application topically daily as needed (knee pain).     . Oxycodone HCl 10 MG TABS Take 10 mg by mouth daily as needed for pain.  0   . rosuvastatin (CRESTOR) 20 MG tablet Take 20 mg by mouth at bedtime.    Taking  . calcium carbonate (OS-CAL - DOSED IN MG OF ELEMENTAL CALCIUM) 1250 (500 Ca) MG tablet Take 1 tablet by mouth daily.     . Vitamin D, Ergocalciferol, (DRISDOL) 50000 UNITS CAPS capsule Take 50,000 Units by mouth every Thursday.    Taking   Allergies  Allergen Reactions  . Lisinopril     cough    Social History   Tobacco Use  . Smoking status: Former Research scientist (life sciences)  . Smokeless tobacco: Never Used  . Tobacco comment: smoked for ayear and half in college   Substance Use Topics  . Alcohol use: Yes    Alcohol/week: 1.8 oz    Types: 3 Glasses of wine per week    Family History  Problem Relation Age of Onset  . Dementia Mother   . Stroke Mother   . Depression Mother   . COPD Father   . Cancer Brother   . Breast cancer Neg Hx      Review of Systems  Constitutional: Negative.   HENT: Negative.   Eyes: Negative.   Respiratory: Negative.   Cardiovascular: Negative.   Gastrointestinal: Positive for constipation.  Genitourinary: Negative.   Musculoskeletal: Positive for back pain and joint pain.  Skin: Negative.   Neurological: Negative.   Endo/Heme/Allergies: Negative.   Psychiatric/Behavioral: Negative.     Objective:  Physical Exam  Constitutional: She is oriented to person, place, and time. She appears well-developed.  HENT:  Head: Normocephalic.  Eyes: Pupils are equal, round, and reactive to light.  Neck: Neck supple. No JVD present. No tracheal deviation present. No thyromegaly present.  Cardiovascular: Normal rate, regular rhythm and intact distal pulses.  Respiratory: Effort normal and breath sounds normal. No respiratory distress. She has no wheezes.  GI: Soft. There is no tenderness. There is no guarding.   Musculoskeletal:       Right hip: She exhibits decreased range of motion, decreased strength, tenderness and bony tenderness. She exhibits no swelling, no deformity and no laceration.  Lymphadenopathy:    She has no cervical adenopathy.  Neurological: She is alert and oriented to person, place, and time.  Skin: Skin is warm and dry.  Psychiatric: She has a normal mood and affect.      Labs:  Estimated body mass index is 28.79 kg/m as calculated from the following:   Height as of 04/25/17: 5'  5" (1.651 m).   Weight as of 04/25/17: 78.5 kg (173 lb).   Imaging Review Plain radiographs demonstrate severe degenerative joint disease of the right hip(s). The bone quality appears to be good for age and reported activity level.    Preoperative templating of the joint replacement has been completed, documented, and submitted to the Operating Room personnel in order to optimize intra-operative equipment management.      Assessment/Plan:  End stage arthritis, right hip  The patient history, physical examination, clinical judgement of the provider and imaging studies are consistent with end stage degenerative joint disease of the right hip(s) and total hip arthroplasty is deemed medically necessary. The treatment options including medical management, injection therapy, arthroscopy and arthroplasty were discussed at length. The risks and benefits of total hip arthroplasty were presented and reviewed. The risks due to aseptic loosening, infection, stiffness, dislocation/subluxation,  thromboembolic complications and other imponderables were discussed.  The patient acknowledged the explanation, agreed to proceed with the plan and consent was signed. Patient is being admitted for inpatient treatment for surgery, pain control, PT, OT, prophylactic antibiotics, VTE prophylaxis, progressive ambulation and ADL's and discharge planning.The patient is planning to be discharged home.     West Pugh  Lashaun Poch   PA-C  04/30/2017, 5:30 PM

## 2017-05-01 DIAGNOSIS — F4321 Adjustment disorder with depressed mood: Secondary | ICD-10-CM | POA: Diagnosis not present

## 2017-05-01 DIAGNOSIS — M4325 Fusion of spine, thoracolumbar region: Secondary | ICD-10-CM | POA: Diagnosis not present

## 2017-05-04 DIAGNOSIS — M4325 Fusion of spine, thoracolumbar region: Secondary | ICD-10-CM | POA: Diagnosis not present

## 2017-05-07 MED ORDER — TRANEXAMIC ACID 1000 MG/10ML IV SOLN
1000.0000 mg | INTRAVENOUS | Status: AC
  Administered 2017-05-08: 1000 mg via INTRAVENOUS
  Filled 2017-05-07: qty 1100

## 2017-05-08 ENCOUNTER — Inpatient Hospital Stay (HOSPITAL_COMMUNITY): Payer: Medicare Other

## 2017-05-08 ENCOUNTER — Encounter (HOSPITAL_COMMUNITY)
Admission: RE | Disposition: A | Discharge status: Home / Self Care | Payer: Self-pay | Source: Ambulatory Visit | Attending: Orthopedic Surgery

## 2017-05-08 ENCOUNTER — Inpatient Hospital Stay (HOSPITAL_COMMUNITY): Payer: Medicare Other | Admitting: Registered Nurse

## 2017-05-08 ENCOUNTER — Inpatient Hospital Stay (HOSPITAL_COMMUNITY)
Admission: RE | Admit: 2017-05-08 | Discharge: 2017-05-11 | DRG: 470 | Disposition: A | Discharge status: Home / Self Care | Payer: Medicare Other | Source: Ambulatory Visit | Attending: Orthopedic Surgery | Admitting: Orthopedic Surgery

## 2017-05-08 ENCOUNTER — Encounter (HOSPITAL_COMMUNITY): Payer: Self-pay

## 2017-05-08 ENCOUNTER — Other Ambulatory Visit: Payer: Self-pay

## 2017-05-08 DIAGNOSIS — I10 Essential (primary) hypertension: Secondary | ICD-10-CM | POA: Diagnosis not present

## 2017-05-08 DIAGNOSIS — E663 Overweight: Secondary | ICD-10-CM | POA: Diagnosis present

## 2017-05-08 DIAGNOSIS — K5900 Constipation, unspecified: Secondary | ICD-10-CM | POA: Diagnosis present

## 2017-05-08 DIAGNOSIS — M419 Scoliosis, unspecified: Secondary | ICD-10-CM | POA: Diagnosis present

## 2017-05-08 DIAGNOSIS — G479 Sleep disorder, unspecified: Secondary | ICD-10-CM | POA: Diagnosis not present

## 2017-05-08 DIAGNOSIS — Z96641 Presence of right artificial hip joint: Secondary | ICD-10-CM | POA: Diagnosis not present

## 2017-05-08 DIAGNOSIS — Z79899 Other long term (current) drug therapy: Secondary | ICD-10-CM

## 2017-05-08 DIAGNOSIS — Z6828 Body mass index (BMI) 28.0-28.9, adult: Secondary | ICD-10-CM

## 2017-05-08 DIAGNOSIS — Z7951 Long term (current) use of inhaled steroids: Secondary | ICD-10-CM | POA: Diagnosis not present

## 2017-05-08 DIAGNOSIS — M62838 Other muscle spasm: Secondary | ICD-10-CM | POA: Diagnosis not present

## 2017-05-08 DIAGNOSIS — G8929 Other chronic pain: Secondary | ICD-10-CM | POA: Diagnosis present

## 2017-05-08 DIAGNOSIS — M1611 Unilateral primary osteoarthritis, right hip: Principal | ICD-10-CM | POA: Diagnosis present

## 2017-05-08 DIAGNOSIS — Z471 Aftercare following joint replacement surgery: Secondary | ICD-10-CM | POA: Diagnosis not present

## 2017-05-08 DIAGNOSIS — Z87891 Personal history of nicotine dependence: Secondary | ICD-10-CM

## 2017-05-08 DIAGNOSIS — M545 Low back pain: Secondary | ICD-10-CM | POA: Diagnosis not present

## 2017-05-08 DIAGNOSIS — Z9181 History of falling: Secondary | ICD-10-CM | POA: Diagnosis not present

## 2017-05-08 DIAGNOSIS — Z96649 Presence of unspecified artificial hip joint: Secondary | ICD-10-CM

## 2017-05-08 DIAGNOSIS — Z888 Allergy status to other drugs, medicaments and biological substances status: Secondary | ICD-10-CM

## 2017-05-08 HISTORY — PX: TOTAL HIP ARTHROPLASTY: SHX124

## 2017-05-08 LAB — TYPE AND SCREEN
ABO/RH(D): O NEG
Antibody Screen: NEGATIVE

## 2017-05-08 LAB — ABO/RH: ABO/RH(D): O NEG

## 2017-05-08 SURGERY — ARTHROPLASTY, HIP, TOTAL, ANTERIOR APPROACH
Anesthesia: General | Site: Hip | Laterality: Right

## 2017-05-08 MED ORDER — HYDROMORPHONE HCL 1 MG/ML IJ SOLN
0.5000 mg | INTRAMUSCULAR | Status: DC | PRN
  Administered 2017-05-08: 0.5 mg via INTRAVENOUS
  Filled 2017-05-08: qty 1

## 2017-05-08 MED ORDER — OXYCODONE HCL 5 MG PO TABS
10.0000 mg | ORAL_TABLET | ORAL | Status: DC | PRN
  Administered 2017-05-09 – 2017-05-11 (×8): 10 mg via ORAL
  Filled 2017-05-08 (×9): qty 2

## 2017-05-08 MED ORDER — LACTATED RINGERS IV SOLN
INTRAVENOUS | Status: DC
  Administered 2017-05-08 (×4): via INTRAVENOUS

## 2017-05-08 MED ORDER — POLYETHYLENE GLYCOL 3350 17 G PO PACK
17.0000 g | PACK | Freq: Two times a day (BID) | ORAL | Status: DC
  Administered 2017-05-09 – 2017-05-11 (×3): 17 g via ORAL
  Filled 2017-05-08 (×5): qty 1

## 2017-05-08 MED ORDER — MIDAZOLAM HCL 2 MG/2ML IJ SOLN
INTRAMUSCULAR | Status: AC
  Filled 2017-05-08: qty 2

## 2017-05-08 MED ORDER — FENTANYL CITRATE (PF) 250 MCG/5ML IJ SOLN
INTRAMUSCULAR | Status: AC
  Filled 2017-05-08: qty 5

## 2017-05-08 MED ORDER — CELECOXIB 200 MG PO CAPS
200.0000 mg | ORAL_CAPSULE | Freq: Two times a day (BID) | ORAL | Status: DC
  Administered 2017-05-08 – 2017-05-11 (×5): 200 mg via ORAL
  Filled 2017-05-08 (×6): qty 1

## 2017-05-08 MED ORDER — METHOCARBAMOL 1000 MG/10ML IJ SOLN
500.0000 mg | Freq: Four times a day (QID) | INTRAVENOUS | Status: DC | PRN
  Administered 2017-05-08: 500 mg via INTRAVENOUS
  Filled 2017-05-08: qty 550

## 2017-05-08 MED ORDER — MEPERIDINE HCL 50 MG/ML IJ SOLN: 6.2500 mg | INTRAMUSCULAR | Status: DC | PRN

## 2017-05-08 MED ORDER — FENTANYL CITRATE (PF) 100 MCG/2ML IJ SOLN
INTRAMUSCULAR | Status: AC
  Filled 2017-05-08: qty 2

## 2017-05-08 MED ORDER — FLUTICASONE PROPIONATE 50 MCG/ACT NA SUSP
1.0000 | Freq: Every evening | NASAL | Status: DC | PRN
  Filled 2017-05-08: qty 16

## 2017-05-08 MED ORDER — MENTHOL 3 MG MT LOZG: 1.0000 | LOZENGE | OROMUCOSAL | Status: DC | PRN

## 2017-05-08 MED ORDER — DEXAMETHASONE SODIUM PHOSPHATE 10 MG/ML IJ SOLN
10.0000 mg | Freq: Once | INTRAMUSCULAR | Status: AC
  Administered 2017-05-08: 10 mg via INTRAVENOUS

## 2017-05-08 MED ORDER — SUGAMMADEX SODIUM 200 MG/2ML IV SOLN
INTRAVENOUS | Status: AC
  Filled 2017-05-08: qty 2

## 2017-05-08 MED ORDER — SUGAMMADEX SODIUM 200 MG/2ML IV SOLN
INTRAVENOUS | Status: DC | PRN
  Administered 2017-05-08: 175 mg via INTRAVENOUS

## 2017-05-08 MED ORDER — LOSARTAN POTASSIUM 50 MG PO TABS
50.0000 mg | ORAL_TABLET | Freq: Every day | ORAL | Status: DC
  Filled 2017-05-08: qty 1

## 2017-05-08 MED ORDER — ONDANSETRON HCL 4 MG PO TABS
4.0000 mg | ORAL_TABLET | Freq: Four times a day (QID) | ORAL | Status: DC | PRN
Start: 2017-05-08 — End: 2017-05-11

## 2017-05-08 MED ORDER — HYDROMORPHONE HCL 1 MG/ML IJ SOLN
0.2500 mg | INTRAMUSCULAR | Status: DC | PRN
  Administered 2017-05-08 (×4): 0.5 mg via INTRAVENOUS

## 2017-05-08 MED ORDER — POLYETHYLENE GLYCOL 3350 17 G PO PACK
17.0000 g | PACK | Freq: Two times a day (BID) | ORAL | 0 refills | Status: DC
Start: 2017-05-08 — End: 2020-08-23

## 2017-05-08 MED ORDER — DEXAMETHASONE SODIUM PHOSPHATE 10 MG/ML IJ SOLN
10.0000 mg | Freq: Once | INTRAMUSCULAR | Status: AC
  Administered 2017-05-09: 10 mg via INTRAVENOUS
  Filled 2017-05-08: qty 1

## 2017-05-08 MED ORDER — ACETAMINOPHEN 500 MG PO TABS
1000.0000 mg | ORAL_TABLET | Freq: Four times a day (QID) | ORAL | Status: AC
  Administered 2017-05-08 – 2017-05-09 (×4): 1000 mg via ORAL
  Filled 2017-05-08 (×4): qty 2

## 2017-05-08 MED ORDER — BISACODYL 10 MG RE SUPP: 10.0000 mg | Freq: Every day | RECTAL | Status: DC | PRN

## 2017-05-08 MED ORDER — OXYCODONE HCL 5 MG PO TABS: 5.0000 mg | ORAL_TABLET | ORAL | 0 refills | Status: DC | PRN

## 2017-05-08 MED ORDER — PROPOFOL 10 MG/ML IV BOLUS
INTRAVENOUS | Status: AC
  Filled 2017-05-08: qty 40

## 2017-05-08 MED ORDER — DIPHENHYDRAMINE HCL 12.5 MG/5ML PO ELIX: 12.5000 mg | ORAL_SOLUTION | ORAL | Status: DC | PRN

## 2017-05-08 MED ORDER — METOCLOPRAMIDE HCL 5 MG PO TABS: 5.0000 mg | ORAL_TABLET | Freq: Three times a day (TID) | ORAL | Status: DC | PRN

## 2017-05-08 MED ORDER — ROCURONIUM BROMIDE 100 MG/10ML IV SOLN
INTRAVENOUS | Status: DC | PRN
  Administered 2017-05-08: 20 mg via INTRAVENOUS
  Administered 2017-05-08: 50 mg via INTRAVENOUS

## 2017-05-08 MED ORDER — FENTANYL CITRATE (PF) 100 MCG/2ML IJ SOLN
INTRAMUSCULAR | Status: DC | PRN
  Administered 2017-05-08: 50 ug via INTRAVENOUS
  Administered 2017-05-08: 100 ug via INTRAVENOUS
  Administered 2017-05-08: 50 ug via INTRAVENOUS
  Administered 2017-05-08: 100 ug via INTRAVENOUS
  Administered 2017-05-08 (×6): 50 ug via INTRAVENOUS

## 2017-05-08 MED ORDER — CEFAZOLIN SODIUM-DEXTROSE 2-4 GM/100ML-% IV SOLN
2.0000 g | INTRAVENOUS | Status: AC
  Administered 2017-05-08: 2 g via INTRAVENOUS
  Filled 2017-05-08: qty 100

## 2017-05-08 MED ORDER — PHENOL 1.4 % MT LIQD
1.0000 | OROMUCOSAL | Status: DC | PRN
  Filled 2017-05-08: qty 177

## 2017-05-08 MED ORDER — ONDANSETRON HCL 4 MG/2ML IJ SOLN: 4.0000 mg | Freq: Four times a day (QID) | INTRAMUSCULAR | Status: DC | PRN

## 2017-05-08 MED ORDER — CHLORHEXIDINE GLUCONATE 4 % EX LIQD: 60.0000 mL | Freq: Once | CUTANEOUS | Status: DC

## 2017-05-08 MED ORDER — BUPROPION HCL ER (XL) 300 MG PO TB24
300.0000 mg | ORAL_TABLET | Freq: Every day | ORAL | Status: DC
  Administered 2017-05-09 – 2017-05-11 (×3): 300 mg via ORAL
  Filled 2017-05-08 (×3): qty 1

## 2017-05-08 MED ORDER — MIDAZOLAM HCL 5 MG/5ML IJ SOLN
INTRAMUSCULAR | Status: DC | PRN
  Administered 2017-05-08 (×2): 1 mg via INTRAVENOUS

## 2017-05-08 MED ORDER — LORATADINE 10 MG PO TABS
10.0000 mg | ORAL_TABLET | Freq: Every day | ORAL | Status: DC
  Filled 2017-05-08 (×2): qty 1

## 2017-05-08 MED ORDER — ONDANSETRON HCL 4 MG/2ML IJ SOLN
INTRAMUSCULAR | Status: DC | PRN
  Administered 2017-05-08: 4 mg via INTRAVENOUS

## 2017-05-08 MED ORDER — HYDROMORPHONE HCL 1 MG/ML IJ SOLN
INTRAMUSCULAR | Status: AC
  Filled 2017-05-08: qty 1

## 2017-05-08 MED ORDER — STERILE WATER FOR IRRIGATION IR SOLN
Status: DC | PRN
  Administered 2017-05-08: 2000 mL

## 2017-05-08 MED ORDER — HYDROMORPHONE HCL 1 MG/ML IJ SOLN
INTRAMUSCULAR | Status: DC | PRN
  Administered 2017-05-08 (×2): 1 mg via INTRAVENOUS

## 2017-05-08 MED ORDER — METHOCARBAMOL 500 MG PO TABS
500.0000 mg | ORAL_TABLET | Freq: Four times a day (QID) | ORAL | Status: DC | PRN
Start: 2017-05-08 — End: 2017-05-11
  Administered 2017-05-09 – 2017-05-11 (×7): 500 mg via ORAL
  Filled 2017-05-08 (×8): qty 1

## 2017-05-08 MED ORDER — ROSUVASTATIN CALCIUM 20 MG PO TABS
20.0000 mg | ORAL_TABLET | Freq: Every day | ORAL | Status: DC
  Administered 2017-05-08 – 2017-05-10 (×3): 20 mg via ORAL
  Filled 2017-05-08 (×3): qty 1

## 2017-05-08 MED ORDER — CLONAZEPAM 1 MG PO TABS
1.0000 mg | ORAL_TABLET | Freq: Every day | ORAL | Status: DC
  Administered 2017-05-08 – 2017-05-10 (×3): 1 mg via ORAL
  Filled 2017-05-08 (×3): qty 1

## 2017-05-08 MED ORDER — TRANEXAMIC ACID 1000 MG/10ML IV SOLN
1000.0000 mg | Freq: Once | INTRAVENOUS | Status: AC
  Administered 2017-05-08: 1000 mg via INTRAVENOUS
  Filled 2017-05-08: qty 1100

## 2017-05-08 MED ORDER — CEFAZOLIN SODIUM-DEXTROSE 2-4 GM/100ML-% IV SOLN
2.0000 g | Freq: Four times a day (QID) | INTRAVENOUS | Status: AC
  Administered 2017-05-08 – 2017-05-09 (×2): 2 g via INTRAVENOUS
  Filled 2017-05-08 (×2): qty 100

## 2017-05-08 MED ORDER — DEXAMETHASONE SODIUM PHOSPHATE 10 MG/ML IJ SOLN
INTRAMUSCULAR | Status: AC
  Filled 2017-05-08: qty 1

## 2017-05-08 MED ORDER — DOCUSATE SODIUM 100 MG PO CAPS: 100.0000 mg | ORAL_CAPSULE | Freq: Two times a day (BID) | ORAL | 0 refills | Status: DC

## 2017-05-08 MED ORDER — GABAPENTIN 300 MG PO CAPS
1800.0000 mg | ORAL_CAPSULE | Freq: Every day | ORAL | Status: DC
  Administered 2017-05-08: 1800 mg via ORAL
  Administered 2017-05-09: 2400 mg via ORAL
  Filled 2017-05-08 (×2): qty 8
  Filled 2017-05-08: qty 4

## 2017-05-08 MED ORDER — ASPIRIN 81 MG PO CHEW
81.0000 mg | CHEWABLE_TABLET | Freq: Two times a day (BID) | ORAL | Status: DC
  Administered 2017-05-08 – 2017-05-11 (×6): 81 mg via ORAL
  Filled 2017-05-08 (×6): qty 1

## 2017-05-08 MED ORDER — ALUM & MAG HYDROXIDE-SIMETH 200-200-20 MG/5ML PO SUSP: 15.0000 mL | ORAL | Status: DC | PRN

## 2017-05-08 MED ORDER — SODIUM CHLORIDE 0.9 % IR SOLN
Status: DC | PRN
  Administered 2017-05-08: 1000 mL

## 2017-05-08 MED ORDER — PROPOFOL 10 MG/ML IV BOLUS
INTRAVENOUS | Status: DC | PRN
  Administered 2017-05-08: 110 mg via INTRAVENOUS

## 2017-05-08 MED ORDER — ACETAMINOPHEN 500 MG PO TABS: 1000.0000 mg | ORAL_TABLET | Freq: Three times a day (TID) | ORAL | 0 refills | Status: DC

## 2017-05-08 MED ORDER — ONDANSETRON HCL 4 MG/2ML IJ SOLN: 4.0000 mg | Freq: Once | INTRAMUSCULAR | Status: DC | PRN

## 2017-05-08 MED ORDER — SODIUM CHLORIDE 0.9 % IV SOLN
INTRAVENOUS | Status: DC
  Administered 2017-05-08: 17:00:00 via INTRAVENOUS

## 2017-05-08 MED ORDER — OXYCODONE HCL 5 MG PO TABS
5.0000 mg | ORAL_TABLET | ORAL | Status: DC | PRN
  Administered 2017-05-08 – 2017-05-09 (×4): 5 mg via ORAL
  Filled 2017-05-08 (×3): qty 1

## 2017-05-08 MED ORDER — ASPIRIN 81 MG PO CHEW: 81.0000 mg | CHEWABLE_TABLET | Freq: Two times a day (BID) | ORAL | 0 refills | Status: AC

## 2017-05-08 MED ORDER — METHOCARBAMOL 500 MG PO TABS: 500.0000 mg | ORAL_TABLET | Freq: Four times a day (QID) | ORAL | 0 refills | Status: DC | PRN

## 2017-05-08 MED ORDER — DOCUSATE SODIUM 100 MG PO CAPS
100.0000 mg | ORAL_CAPSULE | Freq: Two times a day (BID) | ORAL | Status: DC
  Administered 2017-05-08 – 2017-05-11 (×6): 100 mg via ORAL
  Filled 2017-05-08 (×6): qty 1

## 2017-05-08 MED ORDER — HYDROMORPHONE HCL 2 MG/ML IJ SOLN
INTRAMUSCULAR | Status: AC
  Filled 2017-05-08: qty 1

## 2017-05-08 MED ORDER — ONDANSETRON HCL 4 MG/2ML IJ SOLN
INTRAMUSCULAR | Status: AC
  Filled 2017-05-08: qty 2

## 2017-05-08 MED ORDER — MAGNESIUM CITRATE PO SOLN: 1.0000 | Freq: Once | ORAL | Status: DC | PRN

## 2017-05-08 MED ORDER — FERROUS SULFATE 325 (65 FE) MG PO TABS
325.0000 mg | ORAL_TABLET | Freq: Three times a day (TID) | ORAL | Status: DC
  Administered 2017-05-08 – 2017-05-11 (×7): 325 mg via ORAL
  Filled 2017-05-08 (×8): qty 1

## 2017-05-08 MED ORDER — METOCLOPRAMIDE HCL 5 MG/ML IJ SOLN: 5.0000 mg | Freq: Three times a day (TID) | INTRAMUSCULAR | Status: DC | PRN

## 2017-05-08 MED ORDER — FERROUS SULFATE 325 (65 FE) MG PO TABS: 325.0000 mg | ORAL_TABLET | Freq: Three times a day (TID) | ORAL | 3 refills | Status: DC

## 2017-05-08 SURGICAL SUPPLY — 36 items
BAG DECANTER FOR FLEXI CONT (MISCELLANEOUS) IMPLANT
BAG ZIPLOCK 12X15 (MISCELLANEOUS) IMPLANT
BLADE SAG 18X100X1.27 (BLADE) ×2 IMPLANT
CAPT HIP TOTAL 2 ×2 IMPLANT
CLOTH BEACON ORANGE TIMEOUT ST (SAFETY) ×2 IMPLANT
COVER PERINEAL POST (MISCELLANEOUS) ×2 IMPLANT
COVER SURGICAL LIGHT HANDLE (MISCELLANEOUS) ×2 IMPLANT
DERMABOND ADVANCED (GAUZE/BANDAGES/DRESSINGS) ×1
DERMABOND ADVANCED .7 DNX12 (GAUZE/BANDAGES/DRESSINGS) ×1 IMPLANT
DRAPE STERI IOBAN 125X83 (DRAPES) ×2 IMPLANT
DRAPE U-SHAPE 47X51 STRL (DRAPES) ×4 IMPLANT
DRESSING AQUACEL AG SP 3.5X10 (GAUZE/BANDAGES/DRESSINGS) ×1 IMPLANT
DRSG AQUACEL AG SP 3.5X10 (GAUZE/BANDAGES/DRESSINGS) ×2
DURAPREP 26ML APPLICATOR (WOUND CARE) ×2 IMPLANT
ELECT REM PT RETURN 15FT ADLT (MISCELLANEOUS) ×2 IMPLANT
GLOVE BIOGEL M STRL SZ7.5 (GLOVE) ×4 IMPLANT
GLOVE BIOGEL PI IND STRL 7.5 (GLOVE) ×8 IMPLANT
GLOVE BIOGEL PI IND STRL 8.5 (GLOVE) IMPLANT
GLOVE BIOGEL PI INDICATOR 7.5 (GLOVE) ×8
GLOVE BIOGEL PI INDICATOR 8.5 (GLOVE)
GLOVE ECLIPSE 8.0 STRL XLNG CF (GLOVE) IMPLANT
GLOVE ORTHO TXT STRL SZ7.5 (GLOVE) ×2 IMPLANT
GOWN STRL REUS W/TWL 2XL LVL3 (GOWN DISPOSABLE) ×6 IMPLANT
GOWN STRL REUS W/TWL LRG LVL3 (GOWN DISPOSABLE) ×4 IMPLANT
HOLDER FOLEY CATH W/STRAP (MISCELLANEOUS) ×2 IMPLANT
PACK ANTERIOR HIP CUSTOM (KITS) ×2 IMPLANT
SUT MNCRL AB 4-0 PS2 18 (SUTURE) ×2 IMPLANT
SUT STRATAFIX 0 PDS 27 VIOLET (SUTURE) ×2
SUT VIC AB 1 CT1 36 (SUTURE) ×6 IMPLANT
SUT VIC AB 2-0 CT1 27 (SUTURE) ×2
SUT VIC AB 2-0 CT1 TAPERPNT 27 (SUTURE) ×2 IMPLANT
SUTURE STRATFX 0 PDS 27 VIOLET (SUTURE) ×1 IMPLANT
TRAY FOLEY CATH 14FR (SET/KITS/TRAYS/PACK) ×2 IMPLANT
TRAY FOLEY W/METER SILVER 16FR (SET/KITS/TRAYS/PACK) IMPLANT
WATER STERILE IRR 1000ML POUR (IV SOLUTION) ×2 IMPLANT
YANKAUER SUCT BULB TIP 10FT TU (MISCELLANEOUS) IMPLANT

## 2017-05-08 NOTE — Interval H&P Note (Signed)
History and Physical Interval Note:  05/08/2017 10:04 AM  [name]  has presented today for surgery, with the diagnosis of Right hip osteoarthritis  The various methods of treatment have been discussed with the patient and family. After consideration of risks, benefits and other options for treatment, the patient has consented to  Procedure(s) with comments: RIGHT TOTAL HIP ARTHROPLASTY ANTERIOR APPROACH (Right) - 70 mins as a surgical intervention .  The patient's history has been reviewed, patient examined, no change in status, stable for surgery.  I have reviewed the patient's chart and labs.  Questions were answered to the patient's satisfaction.     Mauri Pole

## 2017-05-08 NOTE — Transfer of Care (Signed)
Immediate Anesthesia Transfer of Care Note  Patient: [name]  Procedure(s) Performed: RIGHT TOTAL HIP ARTHROPLASTY ANTERIOR APPROACH (Right Hip)  Patient Location: PACU  Anesthesia Type:General  Level of Consciousness: awake, alert , oriented and patient cooperative  Airway & Oxygen Therapy: Patient Spontanous Breathing and Patient connected to face mask oxygen  Post-op Assessment: Report given to RN, Post -op Vital signs reviewed and stable and Patient moving all extremities X 4  Post vital signs: stable  Last Vitals:  Vitals Value Taken Time  BP 113/74 05/08/2017  1:45 PM  Temp    Pulse 67 05/08/2017  1:48 PM  Resp 9 05/08/2017  1:48 PM  SpO2 100 % 05/08/2017  1:48 PM  Vitals shown include unvalidated device data.  Last Pain:  Vitals:   05/08/17 0924  TempSrc:   PainSc: 1       Patients Stated Pain Goal: 1 (20/23/34 3568)  Complications: No apparent anesthesia complications

## 2017-05-08 NOTE — Anesthesia Preprocedure Evaluation (Signed)
Anesthesia Evaluation  Patient identified by MRN, date of birth, ID band Patient awake    Reviewed: Allergy & Precautions, NPO status , Patient's Chart, lab work & pertinent test results  Airway Mallampati: I  TM Distance: >3 FB Neck ROM: Full    Dental   Pulmonary former smoker,    Pulmonary exam normal        Cardiovascular hypertension, Pt. on medications Normal cardiovascular exam     Neuro/Psych    GI/Hepatic   Endo/Other    Renal/GU      Musculoskeletal   Abdominal   Peds  Hematology   Anesthesia Other Findings   Reproductive/Obstetrics                             Anesthesia Physical Anesthesia Plan  ASA: III  Anesthesia Plan: General   Post-op Pain Management:    Induction: Intravenous  PONV Risk Score and Plan: 3 and Ondansetron and Midazolam  Airway Management Planned: Oral ETT  Additional Equipment:   Intra-op Plan:   Post-operative Plan: Extubation in OR  Informed Consent: I have reviewed the patients History and Physical, chart, labs and discussed the procedure including the risks, benefits and alternatives for the proposed anesthesia with the patient or authorized representative who has indicated his/her understanding and acceptance.     Plan Discussed with: CRNA and Surgeon  Anesthesia Plan Comments:         Anesthesia Quick Evaluation

## 2017-05-08 NOTE — Op Note (Signed)
NAME:  [name]                ACCOUNT NO.: [id number]      MEDICAL RECORD NO.: [id number]      FACILITY:  [facility]      PHYSICIAN:  Mauri Pole  DATE OF BIRTH:  Jan 08, 1946     DATE OF PROCEDURE:  05/08/2017                                 OPERATIVE REPORT         PREOPERATIVE DIAGNOSIS: Right  hip osteoarthritis.      POSTOPERATIVE DIAGNOSIS:  Right hip osteoarthritis.      PROCEDURE:  Right total hip replacement through an anterior approach   utilizing DePuy THR system, component size 52mm pinnacle cup, a size 36+4 neutral   Altrex liner, a size 6 Hi Tri Lock stem with a 36+1.5 delta ceramic   ball.      SURGEON:  Pietro Cassis. Alvan Dame, M.D.      ASSISTANT:  Nehemiah Massed, PA-C     ANESTHESIA:  Spinal.      SPECIMENS:  None.      COMPLICATIONS:  None.      BLOOD LOSS:  800 cc     DRAINS:  None.      INDICATION OF THE PROCEDURE:  [name] is a 72 y.o. female who had   presented to office for evaluation of right hip pain.  Radiographs revealed   progressive degenerative changes with bone-on-bone   articulation to the  hip joint.  The patient had painful limited range of   motion significantly affecting their overall quality of life.  The patient was failing to    respond to conservative measures, and at this point was ready   to proceed with more definitive measures.  The patient has noted progressive   degenerative changes in his hip, progressive problems and dysfunction   with regarding the hip prior to surgery.  Consent was obtained for   benefit of pain relief.  Specific risk of infection, DVT, component   failure, dislocation, need for revision surgery, as well discussion of   the anterior versus posterior approach were reviewed.  Consent was   obtained for benefit of anterior pain relief through an anterior   approach.      PROCEDURE IN DETAIL:  The patient was brought to operative theater.   Once adequate anesthesia,  preoperative antibiotics, 2 gm of Ancef, 1 gm of Tranexamic Acid, and 10 mg of Decadron administered.   The patient was positioned supine on the OSI Hanna table.  Once adequate   padding of boney process was carried out, we had predraped out the hip, and  used fluoroscopy to confirm orientation of the pelvis and position.      The right hip was then prepped and draped from proximal iliac crest to   mid thigh with shower curtain technique.      Time-out was performed identifying the patient, planned procedure, and   extremity.     An incision was then made 2 cm distal and lateral to the   anterior superior iliac spine extending over the orientation of the   tensor fascia lata muscle and sharp dissection was carried down to the   fascia of the muscle and protractor placed in the soft tissues.      The  fascia was then incised.  The muscle belly was identified and swept   laterally and retractor placed along the superior neck.  Following   cauterization of the circumflex vessels and removing some pericapsular   fat, a second cobra retractor was placed on the inferior neck.  A third   retractor was placed on the anterior acetabulum after elevating the   anterior rectus.  A L-capsulotomy was along the line of the   superior neck to the trochanteric fossa, then extended proximally and   distally.  Tag sutures were placed and the retractors were then placed   intracapsular.  We then identified the trochanteric fossa and   orientation of my neck cut, confirmed this radiographically   and then made a neck osteotomy with the femur on traction.  The femoral   head was removed without difficulty or complication.  Traction was let   off and retractors were placed posterior and anterior around the   acetabulum.      The labrum and foveal tissue were debrided.  I began reaming with a 75mmmm   reamer and reamed up to 75mmmm reamer with good bony bed preparation and a 42mmmm   cup was chosen.  The  final 42mmmm Pinnacle cup was then impacted under fluoroscopy  to confirm the depth of penetration and orientation with respect to   abduction.  A screw was placed followed by the hole eliminator.  The final   36+4 neutral Altrex liner was impacted with good visualized rim fit.  The cup was positioned anatomically within the acetabular portion of the pelvis.      At this point, the femur was rolled at 80 degrees.  Further capsule was   released off the inferior aspect of the femoral neck.  I then   released the superior capsule proximally.  The hook was placed laterally   along the femur and elevated manually and held in position with the bed   hook.  The leg was then extended and adducted with the leg rolled to 100   degrees of external rotation.  Once the proximal femur was fully   exposed, I used a box osteotome to set orientation.  I then began   broaching with the starting chili pepper broach and passed this by hand and then broached up to 6.  With the 6 broach in place I chose a high offset neck and did several trial reductions.  The offset was appropriate, leg lengths   appeared to be equal best matched with the +1.5 confirmed radiographically.   Given these findings, I went ahead and dislocated the hip, repositioned all   retractors and positioned the right hip in the extended and abducted position.  The final 6 Hi Tri Lock stem was   chosen and it was impacted down to the level of neck cut.  Based on this   and the trial reduction, a 36+1.5 delta ceramic ball was chosen and   impacted onto a clean and dry trunnion, and the hip was reduced.  The   hip had been irrigated throughout the case again at this point.  I did   reapproximate the superior capsular leaflet to the anterior leaflet   using #1 Vicryl.  The fascia of the   tensor fascia lata muscle was then reapproximated using #1 Vicryl and #0 Stratafix sutures.  The   remaining wound was closed with 2-0 Vicryl and running 4-0  Monocryl.   The hip was cleaned, dried, and  dressed sterilely using Dermabond and   Aquacel dressing.  She was then brought   to recovery room in stable condition tolerating the procedure well.    Nehemiah Massed, PA-C was present for the entirety of the case involved from   preoperative positioning, perioperative retractor management, general   facilitation of the case, as well as primary wound closure as assistant.            Pietro Cassis Alvan Dame, M.D.        05/08/2017 11:31 AM

## 2017-05-08 NOTE — Anesthesia Procedure Notes (Addendum)
Procedure Name: Intubation Date/Time: 05/08/2017 11:30 AM Performed by: Lissa Morales, CRNA Pre-anesthesia Checklist: Patient identified, Emergency Drugs available, Suction available and Patient being monitored Patient Re-evaluated:Patient Re-evaluated prior to induction Oxygen Delivery Method: Circle system utilized Preoxygenation: Pre-oxygenation with 100% oxygen Induction Type: IV induction Ventilation: Mask ventilation without difficulty Laryngoscope Size: Mac and 4 Grade View: Grade II Tube type: Oral Tube size: 7.5 mm Number of attempts: 1 Airway Equipment and Method: Stylet and Oral airway Placement Confirmation: ETT inserted through vocal cords under direct vision,  positive ETCO2 and breath sounds checked- equal and bilateral Secured at: 21 cm Tube secured with: Tape Dental Injury: Teeth and Oropharynx as per pre-operative assessment

## 2017-05-08 NOTE — Anesthesia Postprocedure Evaluation (Signed)
Anesthesia Post Note  Patient: [name]  Procedure(s) Performed: RIGHT TOTAL HIP ARTHROPLASTY ANTERIOR APPROACH (Right Hip)     Patient location during evaluation: PACU Anesthesia Type: General Level of consciousness: awake and alert Pain management: pain level controlled Vital Signs Assessment: post-procedure vital signs reviewed and stable Respiratory status: spontaneous breathing, nonlabored ventilation, respiratory function stable and patient connected to nasal cannula oxygen Cardiovascular status: blood pressure returned to baseline and stable Postop Assessment: no apparent nausea or vomiting Anesthetic complications: no    Last Vitals:  Vitals:   05/08/17 1615 05/08/17 1635  BP: 119/77 117/85  Pulse: 73 72  Resp: 12   Temp: (!) 36.4 C 36.4 C  SpO2: 100% 100%    Last Pain:  Vitals:   05/08/17 1615  TempSrc:   PainSc: 7                  Gailene Youkhana DAVID

## 2017-05-08 NOTE — Discharge Instructions (Signed)

## 2017-05-09 ENCOUNTER — Encounter (HOSPITAL_COMMUNITY): Payer: Self-pay | Admitting: Orthopedic Surgery

## 2017-05-09 DIAGNOSIS — E663 Overweight: Secondary | ICD-10-CM | POA: Diagnosis present

## 2017-05-09 LAB — BASIC METABOLIC PANEL
ANION GAP: 7 (ref 5–15)
BUN: 10 mg/dL (ref 6–20)
CALCIUM: 8.3 mg/dL — AB (ref 8.9–10.3)
CHLORIDE: 107 mmol/L (ref 101–111)
CO2: 26 mmol/L (ref 22–32)
Creatinine, Ser: 0.68 mg/dL (ref 0.44–1.00)
GFR calc non Af Amer: >60 mL/min (ref >60)
Glucose, Bld: 117 mg/dL — ABNORMAL HIGH (ref 65–99)
Potassium: 3.9 mmol/L (ref 3.5–5.1)
SODIUM: 140 mmol/L (ref 135–145)

## 2017-05-09 LAB — CBC
HCT: 30.6 % — ABNORMAL LOW (ref 36.0–46.0)
HEMOGLOBIN: 9.9 g/dL — AB (ref 12.0–15.0)
MCH: 29.1 pg (ref 26.0–34.0)
MCHC: 32.4 g/dL (ref 30.0–36.0)
MCV: 90 fL (ref 78.0–100.0)
Platelets: 217 10*3/uL (ref 150–400)
RBC: 3.4 MIL/uL — ABNORMAL LOW (ref 3.87–5.11)
RDW: 13.5 % (ref 11.5–15.5)
WBC: 13.4 10*3/uL — AB (ref 4.0–10.5)

## 2017-05-09 MED ORDER — SODIUM CHLORIDE 0.9 % IV BOLUS
250.0000 mL | Freq: Once | INTRAVENOUS | Status: AC
  Administered 2017-05-09: 250 mL via INTRAVENOUS

## 2017-05-09 MED ORDER — LIP MEDEX EX OINT
TOPICAL_OINTMENT | CUTANEOUS | Status: AC
  Administered 2017-05-09: 23:00:00
  Filled 2017-05-09: qty 7

## 2017-05-09 NOTE — Progress Notes (Signed)
Merwyn Katos aware of patients hypotension and dizziness. 272mL bolus ordered, recheck of bp 94/60 and spoke with PA. Will continue to monitor.

## 2017-05-09 NOTE — Progress Notes (Signed)
Physical Therapy Treatment Patient Details Name: [name] MRN: [id number] DOB: 1945/01/28 Today's Date: 05/09/2017    History of Present Illness Pt s/p R THR and with hx of extensive back surgery in 2018    PT Comments    Pt motivated and progressing with mobility despite increased pain this pm.  Pt hopes to dc home tomorrow.   Follow Up Recommendations  Follow surgeon's recommendation for DC plan and follow-up therapies     Equipment Recommendations  None recommended by PT    Recommendations for Other Services OT consult     Precautions / Restrictions Precautions Precautions: Fall Restrictions Weight Bearing Restrictions: No Other Position/Activity Restrictions: WBAT    Mobility  Bed Mobility Overal bed mobility: Needs Assistance Bed Mobility: Sit to Supine     Supine to sit: Min assist;Mod assist Sit to supine: Min assist   General bed mobility comments: cues for sequence and use of L LE to self assist.  Physical assist to manage R LE and to bring trunk to upright sitting  Transfers Overall transfer level: Needs assistance Equipment used: Rolling walker (2 wheeled) Transfers: Sit to/from Stand Sit to Stand: Min assist         General transfer comment: cues for LE management and use of UEs to self assist  Ambulation/Gait Ambulation/Gait assistance: Min assist;Min guard Ambulation Distance (Feet): 70 Feet Assistive device: Rolling walker (2 wheeled) Gait Pattern/deviations: Step-to pattern;Step-through pattern;Decreased step length - right;Decreased step length - left;Shuffle;Trunk flexed Gait velocity: decr   General Gait Details: cues for sequence, posture and position from [employer]             Wheelchair Mobility    Modified Rankin (Stroke Patients Only)       Balance Overall balance assessment: Needs assistance Sitting-balance support: No upper extremity supported;Feet supported Sitting balance-Leahy Scale: Good      Standing balance support: Bilateral upper extremity supported Standing balance-Leahy Scale: Poor                              Cognition Arousal/Alertness: Awake/alert Behavior During Therapy: WFL for tasks assessed/performed Overall Cognitive Status: Within Functional Limits for tasks assessed                                        Exercises      General Comments        Pertinent Vitals/Pain Pain Assessment: 0-10 Pain Score: 7  Pain Location: R hip Pain Descriptors / Indicators: Aching;Sore Pain Intervention(s): Limited activity within patient's tolerance;Monitored during session;Premedicated before session;Ice applied;Patient requesting pain meds-RN notified    Home Living                      Prior Function            PT Goals (current goals can now be found in the care plan section) Acute Rehab PT Goals Patient Stated Goal: Regain IND PT Goal Formulation: With patient Time For Goal Achievement: 05/16/17 Potential to Achieve Goals: Fair Progress towards PT goals: Progressing toward goals    Frequency    7X/week      PT Plan Current plan remains appropriate    Co-evaluation              AM-PAC PT "6 Clicks" Daily Activity  Outcome Measure  Difficulty turning over in bed (including adjusting bedclothes, sheets and blankets)?: Unable Difficulty moving from lying on back to sitting on the side of the bed? : Unable Difficulty sitting down on and standing up from a chair with arms (e.g., wheelchair, bedside commode, etc,.)?: Unable Help needed moving to and from a bed to chair (including a wheelchair)?: A Lot Help needed walking in hospital room?: A Little Help needed climbing 3-5 steps with a railing? : A Lot 6 Click Score: 10    End of Session Equipment Utilized During Treatment: Gait belt Activity Tolerance: Patient tolerated treatment well Patient left: in bed;with call bell/phone within reach Nurse  Communication: Mobility status PT Visit Diagnosis: Difficulty in walking, not elsewhere classified (R26.2)     Time: 1740-8144 PT Time Calculation (min) (ACUTE ONLY): 33 min  Charges:  $Gait Training: 23-37 mins                    G Codes:       Pg [phone]    Ozella Comins 05/09/2017, 2:35 PM

## 2017-05-09 NOTE — Evaluation (Signed)
Physical Therapy Evaluation Patient Details Name: [name] MRN: [id number] DOB: 21-Aug-1945 Today's Date: 05/09/2017   History of Present Illness  Pt s/p R THR and with hx of extensive back surgery in 2018  Clinical Impression  Pt s/p R THR and presents with decreased R LE strength/ROM and post op pain limiting functional mobility.  Pt should progress to dc home with family assist.  OOB deferred this session pending completion of bolus for decreased BP.    Follow Up Recommendations Follow surgeon's recommendation for DC plan and follow-up therapies    Equipment Recommendations  None recommended by PT    Recommendations for Other Services OT consult     Precautions / Restrictions Precautions Precautions: Fall Restrictions Weight Bearing Restrictions: No Other Position/Activity Restrictions: WBAT      Mobility  Bed Mobility                  Transfers                    Ambulation/Gait                Stairs            Wheelchair Mobility    Modified Rankin (Stroke Patients Only)       Balance                                             Pertinent Vitals/Pain Pain Assessment: 0-10 Pain Score: 4  Pain Location: R hip Pain Descriptors / Indicators: Aching;Sore Pain Intervention(s): Limited activity within patient's tolerance;Monitored during session;Premedicated before session;Ice applied    Home Living Family/patient expects to be discharged to:: Private residence Living Arrangements: Spouse/significant other Available Help at Discharge: Family Type of Home: House Home Access: Stairs to enter Entrance Stairs-Rails: None Technical brewer of Steps: 3 Home Layout: One level Home Equipment: Environmental consultant - 2 wheels      Prior Function Level of Independence: Independent with assistive device(s)         Comments: using RW for balance and hip pain     Hand Dominance        Extremity/Trunk  Assessment        Lower Extremity Assessment Lower Extremity Assessment: RLE deficits/detail RLE Deficits / Details: Strength at hip 2-/5 with AAROM at hip to 80 flex and 15 abd       Communication   Communication: No difficulties  Cognition Arousal/Alertness: Awake/alert Behavior During Therapy: WFL for tasks assessed/performed Overall Cognitive Status: Within Functional Limits for tasks assessed                                        General Comments      Exercises Total Joint Exercises Ankle Circles/Pumps: AROM;Both;15 reps;Supine Quad Sets: AROM;Both;10 reps;Supine Heel Slides: AAROM;Right;20 reps;Supine Hip ABduction/ADduction: AAROM;Right;15 reps;Supine   Assessment/Plan    PT Assessment Patient needs continued PT services  PT Problem List Decreased strength;Decreased range of motion;Decreased activity tolerance;Decreased balance;Decreased mobility;Decreased knowledge of use of DME;Pain       PT Treatment Interventions DME instruction;Gait training;Stair training;Functional mobility training;Therapeutic activities;Therapeutic exercise;Patient/family education    PT Goals (Current goals can be found in the Care Plan section)  Acute Rehab PT Goals Patient Stated Goal: Regain IND PT Goal  Formulation: With patient Time For Goal Achievement: 05/16/17 Potential to Achieve Goals: Fair    Frequency 7X/week   Barriers to discharge        Co-evaluation               AM-PAC PT "6 Clicks" Daily Activity  Outcome Measure Difficulty turning over in bed (including adjusting bedclothes, sheets and blankets)?: Unable Difficulty moving from lying on back to sitting on the side of the bed? : Unable Difficulty sitting down on and standing up from a chair with arms (e.g., wheelchair, bedside commode, etc,.)?: Unable Help needed moving to and from a bed to chair (including a wheelchair)?: A Lot Help needed walking in hospital room?: A Little Help  needed climbing 3-5 steps with a railing? : A Lot 6 Click Score: 10    End of Session   Activity Tolerance: Patient tolerated treatment well Patient left: in bed;with call bell/phone within reach   PT Visit Diagnosis: Difficulty in walking, not elsewhere classified (R26.2)    Time: 0383-3383 PT Time Calculation (min) (ACUTE ONLY): 26 min   Charges:   PT Evaluation $PT Eval Low Complexity: 1 Low PT Treatments $Therapeutic Exercise: 8-22 mins   PT G Codes:        Pg [phone]   Latara Micheli 05/09/2017, 10:07 AM

## 2017-05-09 NOTE — Progress Notes (Signed)
Physical Therapy Treatment Patient Details Name: [name] MRN: [id number] DOB: 1945-01-12 Today's Date: 05/09/2017    History of Present Illness Pt s/p R THR and with hx of extensive back surgery in 2018    PT Comments    Initiated ambulation this session with pt ltd by fatigue.     Follow Up Recommendations  Follow surgeon's recommendation for DC plan and follow-up therapies     Equipment Recommendations  None recommended by PT    Recommendations for Other Services OT consult     Precautions / Restrictions Precautions Precautions: Fall Restrictions Weight Bearing Restrictions: No Other Position/Activity Restrictions: WBAT    Mobility  Bed Mobility Overal bed mobility: Needs Assistance Bed Mobility: Supine to Sit     Supine to sit: Min assist;Mod assist     General bed mobility comments: cues for sequence and use of L LE to self assist.  Physical assist to manage R LE and to bring trunk to upright sitting  Transfers Overall transfer level: Needs assistance Equipment used: Rolling walker (2 wheeled) Transfers: Sit to/from Stand Sit to Stand: Min assist;Mod assist         General transfer comment: cues for LE management and use of UEs to self assist  Ambulation/Gait Ambulation/Gait assistance: Min assist Ambulation Distance (Feet): 30 Feet Assistive device: Rolling walker (2 wheeled) Gait Pattern/deviations: Step-to pattern;Decreased step length - right;Decreased step length - left;Shuffle;Trunk flexed Gait velocity: decr   General Gait Details: cues for sequence, posture and position from [employer]             Wheelchair Mobility    Modified Rankin (Stroke Patients Only)       Balance Overall balance assessment: Needs assistance Sitting-balance support: No upper extremity supported;Feet supported Sitting balance-Leahy Scale: Good     Standing balance support: Bilateral upper extremity supported Standing balance-Leahy Scale:  Poor                              Cognition Arousal/Alertness: Awake/alert Behavior During Therapy: WFL for tasks assessed/performed Overall Cognitive Status: Within Functional Limits for tasks assessed                                        Exercises Total Joint Exercises Ankle Circles/Pumps: AROM;Both;15 reps;Supine Quad Sets: AROM;Both;10 reps;Supine Heel Slides: AAROM;Right;20 reps;Supine Hip ABduction/ADduction: AAROM;Right;15 reps;Supine    General Comments        Pertinent Vitals/Pain Pain Assessment: 0-10 Pain Score: 4  Pain Location: R hip Pain Descriptors / Indicators: Aching;Sore Pain Intervention(s): Limited activity within patient's tolerance;Monitored during session;Premedicated before session;Ice applied    Home Living Family/patient expects to be discharged to:: Private residence Living Arrangements: Spouse/significant other Available Help at Discharge: Family Type of Home: House Home Access: Stairs to enter Entrance Stairs-Rails: None Home Layout: One level Home Equipment: Environmental consultant - 2 wheels      Prior Function Level of Independence: Independent with assistive device(s)      Comments: using RW for balance and hip pain   PT Goals (current goals can now be found in the care plan section) Acute Rehab PT Goals Patient Stated Goal: Regain IND PT Goal Formulation: With patient Time For Goal Achievement: 05/16/17 Potential to Achieve Goals: Fair Progress towards PT goals: Progressing toward goals    Frequency    7X/week  PT Plan Current plan remains appropriate    Co-evaluation              AM-PAC PT "6 Clicks" Daily Activity  Outcome Measure  Difficulty turning over in bed (including adjusting bedclothes, sheets and blankets)?: Unable Difficulty moving from lying on back to sitting on the side of the bed? : Unable Difficulty sitting down on and standing up from a chair with arms (e.g., wheelchair,  bedside commode, etc,.)?: Unable Help needed moving to and from a bed to chair (including a wheelchair)?: A Lot Help needed walking in hospital room?: A Little Help needed climbing 3-5 steps with a railing? : A Lot 6 Click Score: 10    End of Session Equipment Utilized During Treatment: Gait belt Activity Tolerance: Patient tolerated treatment well Patient left: in chair;with call bell/phone within reach;with chair alarm set Nurse Communication: Mobility status PT Visit Diagnosis: Difficulty in walking, not elsewhere classified (R26.2)     Time: 4970-2637 PT Time Calculation (min) (ACUTE ONLY): 25 min  Charges:  $Gait Training: 23-37 mins $Therapeutic Exercise: 8-22 mins                    G Codes:       Pg [phone]    Amrit Cress 05/09/2017, 12:26 PM

## 2017-05-09 NOTE — Progress Notes (Signed)
     Subjective: 1 Day Post-Op Procedure(s) (LRB): RIGHT TOTAL HIP ARTHROPLASTY ANTERIOR APPROACH (Right)   Patient reports pain as mild, pain controlled.  No events throughout the night. Ready to work with PT.  Ready to be discharged home.   Objective:   VITALS:   Vitals:   05/09/17 0237 05/09/17 0536  BP: 109/60 99/68  Pulse: 75 78  Resp: 16 16  Temp: 98.1 F (36.7 C) 98.1 F (36.7 C)  SpO2: 98% 97%    Dorsiflexion/Plantar flexion intact Incision: dressing C/D/I No cellulitis present Compartment soft  LABS Recent Labs    05/09/17 0559  HGB 9.9*  HCT 30.6*  WBC 13.4*  PLT 217    Recent Labs    05/09/17 0559  NA 140  K 3.9  BUN 10  CREATININE 0.68  GLUCOSE 117*     Assessment/Plan: 1 Day Post-Op Procedure(s) (LRB): RIGHT TOTAL HIP ARTHROPLASTY ANTERIOR APPROACH (Right) Foley cath d/c'ed Advance diet Up with therapy D/C IV fluids Discharge home Follow up in 2 weeks at [facility]. Follow up with OLIN,Sigmund Morera D in 2 weeks.  Contact information:  [facility] [address] [place] [phone]    Overweight (BMI 25-29.9) Estimated body mass index is 28.79 kg/m as calculated from the following:   Height as of this encounter: 5\' 5"  (1.651 m).   Weight as of this encounter: 78.5 kg (173 lb). Patient also counseled that weight may inhibit the healing process Patient counseled that losing weight will help with future health issues      West Pugh. Tineshia Becraft   PAC  05/09/2017, 8:58 AM

## 2017-05-10 LAB — CBC
HEMATOCRIT: 30.6 % — AB (ref 36.0–46.0)
HEMOGLOBIN: 10 g/dL — AB (ref 12.0–15.0)
MCH: 29.2 pg (ref 26.0–34.0)
MCHC: 32.7 g/dL (ref 30.0–36.0)
MCV: 89.5 fL (ref 78.0–100.0)
Platelets: 189 10*3/uL (ref 150–400)
RBC: 3.42 MIL/uL — ABNORMAL LOW (ref 3.87–5.11)
RDW: 13.8 % (ref 11.5–15.5)
WBC: 14.4 10*3/uL — AB (ref 4.0–10.5)

## 2017-05-10 LAB — BASIC METABOLIC PANEL
ANION GAP: 8 (ref 5–15)
BUN: 5 mg/dL — ABNORMAL LOW (ref 6–20)
CALCIUM: 8.5 mg/dL — AB (ref 8.9–10.3)
CO2: 27 mmol/L (ref 22–32)
Chloride: 105 mmol/L (ref 101–111)
Creatinine, Ser: 0.6 mg/dL (ref 0.44–1.00)
Glucose, Bld: 98 mg/dL (ref 65–99)
Potassium: 3.5 mmol/L (ref 3.5–5.1)
Sodium: 140 mmol/L (ref 135–145)

## 2017-05-10 NOTE — Progress Notes (Signed)
Physical Therapy Treatment Patient Details Name: [name] MRN: [id number] DOB: 08/26/1945 Today's Date: 05/10/2017    History of Present Illness Pt s/p R THR and with hx of extensive back surgery in 2018    PT Comments    Pt limited this am by fatigue and increased muscle spasms.  Pt ambulated in hall and assist back to bed and positioned on L side to sleep.   Follow Up Recommendations  Follow surgeon's recommendation for DC plan and follow-up therapies     Equipment Recommendations  None recommended by PT    Recommendations for Other Services OT consult     Precautions / Restrictions Precautions Precautions: Fall Restrictions Weight Bearing Restrictions: No RLE Weight Bearing: Weight bearing as tolerated Other Position/Activity Restrictions: WBAT    Mobility  Bed Mobility Overal bed mobility: Needs Assistance Bed Mobility: Sit to Supine       Sit to supine: Min assist   General bed mobility comments: cues for sequence and use of L LE to self assist.  Physical assist to manage R LE and to bring trunk to upright sitting  Transfers Overall transfer level: Needs assistance Equipment used: Rolling walker (2 wheeled) Transfers: Sit to/from Stand Sit to Stand: Min assist         General transfer comment: cues for LE management and use of UEs to self assist  Ambulation/Gait Ambulation/Gait assistance: Min assist;Min guard Ambulation Distance (Feet): 111 Feet Assistive device: Rolling walker (2 wheeled) Gait Pattern/deviations: Step-to pattern;Step-through pattern;Decreased step length - right;Decreased step length - left;Shuffle;Trunk flexed Gait velocity: decr   General Gait Details: cues for sequence, posture and position from [employer]             Wheelchair Mobility    Modified Rankin (Stroke Patients Only)       Balance Overall balance assessment: Needs assistance Sitting-balance support: No upper extremity supported;Feet  supported Sitting balance-Leahy Scale: Good     Standing balance support: Bilateral upper extremity supported Standing balance-Leahy Scale: Poor                              Cognition Arousal/Alertness: Awake/alert Behavior During Therapy: WFL for tasks assessed/performed Overall Cognitive Status: Within Functional Limits for tasks assessed                                        Exercises      General Comments        Pertinent Vitals/Pain Pain Assessment: 0-10 Pain Score: 7  Pain Location: R hip and thigh Pain Descriptors / Indicators: Aching;Sore Pain Intervention(s): Limited activity within patient's tolerance;Monitored during session;Premedicated before session;Ice applied    Home Living                      Prior Function            PT Goals (current goals can now be found in the care plan section) Acute Rehab PT Goals Patient Stated Goal: Regain IND PT Goal Formulation: With patient Time For Goal Achievement: 05/16/17 Potential to Achieve Goals: Fair Progress towards PT goals: Progressing toward goals    Frequency    7X/week      PT Plan Current plan remains appropriate    Co-evaluation  AM-PAC PT "6 Clicks" Daily Activity  Outcome Measure  Difficulty turning over in bed (including adjusting bedclothes, sheets and blankets)?: Unable Difficulty moving from lying on back to sitting on the side of the bed? : Unable Difficulty sitting down on and standing up from a chair with arms (e.g., wheelchair, bedside commode, etc,.)?: Unable Help needed moving to and from a bed to chair (including a wheelchair)?: A Lot Help needed walking in hospital room?: A Little Help needed climbing 3-5 steps with a railing? : A Lot 6 Click Score: 10    End of Session Equipment Utilized During Treatment: Gait belt Activity Tolerance: Patient tolerated treatment well Patient left: in bed;with call bell/phone within  reach Nurse Communication: Mobility status PT Visit Diagnosis: Difficulty in walking, not elsewhere classified (R26.2)     Time: 9842-1031 PT Time Calculation (min) (ACUTE ONLY): 18 min  Charges:  $Gait Training: 8-22 mins                    G Codes:       Pg [phone]    Chamaine Stankus 05/10/2017, 11:43 AM

## 2017-05-10 NOTE — Progress Notes (Signed)
OT Cancellation Note  Patient Details Name: [name] MRN: [id number] DOB: [birth year]   Cancelled Treatment:    Reason Eval/Treat Not Completed: Other (comment).  Pt had muscle spasms when I checked on her earlier and PT got her positioned comfortably afterwards. She is now sleeping. Will check back.  Sage Hammill 05/10/2017, 10:34 AM  Lesle Chris, OTR/L [phone] 05/10/2017

## 2017-05-10 NOTE — Progress Notes (Signed)
Physical Therapy Treatment Patient Details Name: [name] MRN: [id number] DOB: 1945-04-22 Today's Date: 05/10/2017    History of Present Illness Pt s/p R THR and with hx of extensive back surgery in 2018    PT Comments    Pt feeling better this pm and progressing with mobility but continues to struggle with bed mobility.  Pt hopeful for return home tomorrow.   Follow Up Recommendations  Follow surgeon's recommendation for DC plan and follow-up therapies     Equipment Recommendations  None recommended by PT    Recommendations for Other Services OT consult     Precautions / Restrictions Precautions Precautions: Fall Restrictions Weight Bearing Restrictions: No RLE Weight Bearing: Weight bearing as tolerated    Mobility  Bed Mobility Overal bed mobility: Needs Assistance Bed Mobility: Supine to Sit     Supine to sit: Min guard     General bed mobility comments: Increased time with sequence cues and min assist to manage R LE  Transfers Overall transfer level: Needs assistance Equipment used: Rolling walker (2 wheeled) Transfers: Sit to/from Stand Sit to Stand: Min guard         General transfer comment: min cues for safe transition position and use of UEs to self assist  Ambulation/Gait Ambulation/Gait assistance: Min guard Ambulation Distance (Feet): 140 Feet Assistive device: Rolling walker (2 wheeled) Gait Pattern/deviations: Step-to pattern;Step-through pattern;Decreased step length - right;Decreased step length - left;Shuffle;Trunk flexed Gait velocity: decr   General Gait Details: cues for posture and position from [employer]             Wheelchair Mobility    Modified Rankin (Stroke Patients Only)       Balance Overall balance assessment: Needs assistance Sitting-balance support: No upper extremity supported;Feet supported Sitting balance-Leahy Scale: Good     Standing balance support: Bilateral upper extremity  supported Standing balance-Leahy Scale: Fair                              Cognition Arousal/Alertness: Awake/alert Behavior During Therapy: WFL for tasks assessed/performed Overall Cognitive Status: Within Functional Limits for tasks assessed                                        Exercises Total Joint Exercises Ankle Circles/Pumps: AROM;Both;15 reps;Supine Quad Sets: AROM;Both;10 reps;Supine Heel Slides: AAROM;Right;20 reps;Supine Hip ABduction/ADduction: AAROM;Right;15 reps;Supine    General Comments        Pertinent Vitals/Pain Pain Assessment: 0-10 Pain Score: 7  Pain Location: R hip and thigh with movement Pain Descriptors / Indicators: Aching;Sore Pain Intervention(s): Limited activity within patient's tolerance;Monitored during session;Premedicated before session;Ice applied    Home Living Family/patient expects to be discharged to:: Private residence Living Arrangements: Spouse/significant other Available Help at Discharge: Family         Home Equipment: Bedside commode;Shower Printmaker)      Prior Function Level of Independence: Independent with assistive device(s)          PT Goals (current goals can now be found in the care plan section) Acute Rehab PT Goals Patient Stated Goal: Regain IND PT Goal Formulation: With patient Time For Goal Achievement: 05/16/17 Potential to Achieve Goals: Fair Progress towards PT goals: Progressing toward goals    Frequency    7X/week      PT Plan  Current plan remains appropriate    Co-evaluation              AM-PAC PT "6 Clicks" Daily Activity  Outcome Measure  Difficulty turning over in bed (including adjusting bedclothes, sheets and blankets)?: Unable Difficulty moving from lying on back to sitting on the side of the bed? : Unable Difficulty sitting down on and standing up from a chair with arms (e.g., wheelchair, bedside commode, etc,.)?:  Unable Help needed moving to and from a bed to chair (including a wheelchair)?: A Lot Help needed walking in hospital room?: A Little Help needed climbing 3-5 steps with a railing? : A Lot 6 Click Score: 10    End of Session Equipment Utilized During Treatment: Gait belt Activity Tolerance: Patient tolerated treatment well Patient left: in bed;with call bell/phone within reach Nurse Communication: Mobility status PT Visit Diagnosis: Difficulty in walking, not elsewhere classified (R26.2)     Time: 7001-7494 PT Time Calculation (min) (ACUTE ONLY): 27 min  Charges:  $Gait Training: 8-22 mins $Therapeutic Exercise: 8-22 mins                    G Codes:       Pg [phone]    Nemiah Kissner 05/10/2017, 5:24 PM

## 2017-05-10 NOTE — Progress Notes (Signed)
     Subjective: 2 Days Post-Op Procedure(s) (LRB): RIGHT TOTAL HIP ARTHROPLASTY ANTERIOR APPROACH (Right)   Patient reports pain as severe, c/o significant muscle spasms.  Difficulty with pain and ambulation to get toilet to urinate.  She is frustrated and had some anxiety with regards to not doing as well as she felt she should be at this time. She did have a fever last night, but this has since decreased after doing the incentive spirometer.  Objective:   VITALS:   Vitals:   05/10/17 0639 05/10/17 0722  BP: 117/72   Pulse: 94   Resp: 18   Temp: (!) 101.2 F (38.4 C) 99.1 F (37.3 C)  SpO2: 95%     Dorsiflexion/Plantar flexion intact Incision: dressing C/D/I No cellulitis present Compartment soft  LABS Recent Labs    05/09/17 0559 05/10/17 0540  HGB 9.9* 10.0*  HCT 30.6* 30.6*  WBC 13.4* 14.4*  PLT 217 189    Recent Labs    05/09/17 0559 05/10/17 0540  NA 140 140  K 3.9 3.5  BUN 10 5*  CREATININE 0.68 0.60  GLUCOSE 117* 98     Assessment/Plan: 2 Days Post-Op Procedure(s) (LRB): RIGHT TOTAL HIP ARTHROPLASTY ANTERIOR APPROACH (Right)  Discussed using muscle relaxer in a more regular basis as well as working with PT Up with therapy Discharge home, eventually when ready    West Pugh. Cephus Tupy   PAC  05/10/2017, 8:33 AM

## 2017-05-10 NOTE — Evaluation (Signed)
Occupational Therapy Evaluation Patient Details Name: [name] MRN: [id number] DOB: 1945/10/31 Today's Date: 05/10/2017    History of Present Illness Pt s/p R THR and with hx of extensive back surgery in 2018   Clinical Impression   This 72 year old female was admitted for the above sx.  Evaluation was limited due to pain.  Pt verbalizes use of AE for adls. Will follow in acute to further educate on bathroom transfers    Follow Up Recommendations  Supervision/Assistance - 24 hour    Equipment Recommendations  None recommended by OT    Recommendations for Other Services       Precautions / Restrictions Precautions Precautions: Fall Restrictions RLE Weight Bearing: Weight bearing as tolerated      Mobility Bed Mobility               General bed mobility comments: tried leg lifter; did not lessen pain during bed mobility.  assisted with repositioning in bed  Transfers                 General transfer comment: not performed:  pt did not want to get OOB again    Balance                                           ADL either performed or assessed with clinical judgement   ADL Overall ADL's : Needs assistance/impaired                                       General ADL Comments: pt has been using AE for adls due to extensive back sx.  She has reacher and sock aide but not long sponge, and she is interested in getting this.  Educated pt on leg lifter for OOB; this was painful for her to use.  Based on clinical reasoning, pt needs set up for UB adls and min A for LB adls.       Vision         Perception     Praxis      Pertinent Vitals/Pain Pain Score: 8  Pain Location: R hip and thigh with movement Pain Descriptors / Indicators: Aching;Sore Pain Intervention(s): Limited activity within patient's tolerance;Monitored during session;Premedicated before session;Repositioned;Ice applied     Hand Dominance      Extremity/Trunk Assessment Upper Extremity Assessment Upper Extremity Assessment: Overall WFL for tasks assessed           Communication Communication Communication: No difficulties   Cognition Arousal/Alertness: Awake/alert Behavior During Therapy: WFL for tasks assessed/performed Overall Cognitive Status: Within Functional Limits for tasks assessed                                     General Comments       Exercises     Shoulder Instructions      Home Living Family/patient expects to be discharged to:: Private residence Living Arrangements: Spouse/significant other Available Help at Discharge: Family               Bathroom Shower/Tub: Walk-in Corporate treasurer Toilet: Handicapped height     Home Equipment: Bedside commode;Shower Printmaker)  Prior Functioning/Environment Level of Independence: Independent with assistive device(s)                 OT Problem List: Decreased strength;Decreased activity tolerance;Pain      OT Treatment/Interventions: Self-care/ADL training;DME and/or AE instruction;Patient/family education    OT Goals(Current goals can be found in the care plan section) Acute Rehab OT Goals Patient Stated Goal: Regain IND OT Goal Formulation: With patient Time For Goal Achievement: 05/24/17 Potential to Achieve Goals: Good ADL Goals Pt Will Transfer to Toilet: with min guard assist;ambulating;bedside commode Pt Will Perform Tub/Shower Transfer: Shower transfer;with min guard assist;ambulating;shower seat  OT Frequency: Min 2X/week   Barriers to D/C:            Co-evaluation              AM-PAC PT "6 Clicks" Daily Activity     Outcome Measure Help from another person eating meals?: None Help from another person taking care of personal grooming?: A Little Help from another person toileting, which includes using toliet, bedpan, or urinal?: A Little Help from another  person bathing (including washing, rinsing, drying)?: A Little Help from another person to put on and taking off regular upper body clothing?: A Little Help from another person to put on and taking off regular lower body clothing?: A Little 6 Click Score: 19   End of Session    Activity Tolerance: Patient limited by pain Patient left: in bed;with call bell/phone within reach  OT Visit Diagnosis: Pain Pain - Right/Left: Right Pain - part of body: Hip                Time: 1610-9604 OT Time Calculation (min): 15 min Charges:  OT General Charges $OT Visit: 1 Visit OT Evaluation $OT Eval Low Complexity: 1 Low G-Codes:     [place], OTR/L [phone] 05/10/2017  [place] 05/10/2017, 4:40 PM

## 2017-05-11 MED ORDER — GABAPENTIN 300 MG PO CAPS
300.0000 mg | ORAL_CAPSULE | Freq: Two times a day (BID) | ORAL | Status: DC
  Administered 2017-05-11 (×2): 300 mg via ORAL
  Filled 2017-05-11 (×2): qty 1

## 2017-05-11 NOTE — Progress Notes (Signed)
Patient ID: [name], female   DOB: 05-19-1945, 72 y.o.   MRN: [id number] Subjective: 3 Days Post-Op Procedure(s) (LRB): RIGHT TOTAL HIP ARTHROPLASTY ANTERIOR APPROACH (Right)    Patient reports pain as moderate.  Still challenged with bed mobility.  Worried about pain control and function  Objective:   VITALS:   Vitals:   05/10/17 2256 05/11/17 0637  BP:  100/66  Pulse:  84  Resp:  15  Temp: 99.9 F (37.7 C) 98.5 F (36.9 C)  SpO2:  97%    Neurovascular intact Incision: dressing C/D/I  LABS Recent Labs    05/09/17 0559 05/10/17 0540  HGB 9.9* 10.0*  HCT 30.6* 30.6*  WBC 13.4* 14.4*  PLT 217 189    Recent Labs    05/09/17 0559 05/10/17 0540  NA 140 140  K 3.9 3.5  BUN 10 5*  CREATININE 0.68 0.60  GLUCOSE 117* 98    No results for input(s): LABPT, INR in the last 72 hours.   Assessment/Plan: 3 Days Post-Op Procedure(s) (LRB): RIGHT TOTAL HIP ARTHROPLASTY ANTERIOR APPROACH (Right)   Advance diet Up with therapy   Work with therapy today If unable to make significant progress with bed mobility hold discharge until tomorrow Add gabapentin morning and lunch doses So, wither home today after therapy or tomorrow

## 2017-05-11 NOTE — Progress Notes (Signed)
Physical Therapy Treatment Patient Details Name: [name] MRN: [id number] DOB: Jun 08, 1945 Today's Date: 05/11/2017    History of Present Illness Pt s/p R THR and with hx of extensive back surgery in 2018    PT Comments    POD # 3 Assisted with amb in hallway, practiced stairs then performed HEP following handout.  Instructed on proper tech, freq as well use of ICE. Pt plans to D/C to home later today  Follow Up Recommendations  Follow surgeon's recommendation for DC plan and follow-up therapies(HEP)     Equipment Recommendations  None recommended by PT    Recommendations for Other Services       Precautions / Restrictions Precautions Precautions: Fall Precaution Comments: scoliosis  Restrictions Weight Bearing Restrictions: No RLE Weight Bearing: Weight bearing as tolerated Other Position/Activity Restrictions: WBAT    Mobility  Bed Mobility Overal bed mobility: Needs Assistance       Supine to sit: Supervision Sit to supine: Min assist   General bed mobility comments: increased assist back to bed support B LE due to hip pain   Transfers Overall transfer level: Needs assistance Equipment used: Rolling walker (2 wheeled) Transfers: Sit to/from Stand Sit to Stand: Supervision         General transfer comment: <25% VC's on proper hand placement and safety with turns   Ambulation/Gait Ambulation/Gait assistance: Supervision;Min guard Ambulation Distance (Feet): 85 Feet Assistive device: Rolling walker (2 wheeled) Gait Pattern/deviations: Step-to pattern;Step-through pattern;Decreased step length - right;Decreased step length - left;Shuffle;Trunk flexed Gait velocity: decreased    General Gait Details: <25% cues for posture and position from RW   Stairs Stairs: Yes Stairs assistance: Min assist Stair Management: No rails;With walker;Backwards Number of Stairs: 2 General stair comments: 25% VC's on proper tech, walker placement and  sequencing Also given handout  Wheelchair Mobility    Modified Rankin (Stroke Patients Only)       Balance                                            Cognition Arousal/Alertness: Awake/alert Behavior During Therapy: WFL for tasks assessed/performed Overall Cognitive Status: Within Functional Limits for tasks assessed                                        Exercises   Total Hip Replacement TE's 10 reps ankle pumps 10 reps knee presses 10 reps heel slides 10 reps SAQ's 10 reps ABD Followed by ICE Also issued a handout    General Comments        Pertinent Vitals/Pain Pain Assessment: 0-10 Pain Score: 5  Pain Location: R hip  Pain Descriptors / Indicators: Sore Pain Intervention(s): Limited activity within patient's tolerance;Monitored during session;Premedicated before session;Repositioned;Ice applied    Home Living                      Prior Function            PT Goals (current goals can now be found in the care plan section) Progress towards PT goals: Progressing toward goals    Frequency    7X/week      PT Plan Current plan remains appropriate    Co-evaluation  AM-PAC PT "6 Clicks" Daily Activity  Outcome Measure  Difficulty turning over in bed (including adjusting bedclothes, sheets and blankets)?: A Lot Difficulty moving from lying on back to sitting on the side of the bed? : A Lot Difficulty sitting down on and standing up from a chair with arms (e.g., wheelchair, bedside commode, etc,.)?: A Lot Help needed moving to and from a bed to chair (including a wheelchair)?: A Lot Help needed walking in hospital room?: A Lot Help needed climbing 3-5 steps with a railing? : A Lot 6 Click Score: 12    End of Session Equipment Utilized During Treatment: Gait belt Activity Tolerance: Patient tolerated treatment well Patient left: in bed;with call bell/phone within reach Nurse  Communication: Mobility status(pt ready for D/C to home after one PT session for today) PT Visit Diagnosis: Difficulty in walking, not elsewhere classified (R26.2)     Time: 2956-2130 PT Time Calculation (min) (ACUTE ONLY): 30 min  Charges:  $Gait Training: 8-22 mins $Therapeutic Exercise: 8-22 mins                    G Codes:       Rica Koyanagi  PTA [facility]  Acute  Rehab Pager      [phone]

## 2017-05-11 NOTE — Progress Notes (Signed)
Occupational Therapy Treatment Patient Details Name: RAYVEN RETTIG MRN: [id number] DOB: [birth year] Today's Date: 05/11/2017    History of present illness Pt s/p R THR and with hx of extensive back surgery in 2018   OT comments  All education completed this session  Follow Up Recommendations  Supervision/Assistance - 24 hour    Equipment Recommendations  None recommended by OT    Recommendations for Other Services      Precautions / Restrictions Precautions Precautions: Fall Restrictions RLE Weight Bearing: Weight bearing as tolerated       Mobility Bed Mobility         Supine to sit: Supervision     General bed mobility comments: used gait belt and moved bil LEs together. rolled to push up on elbow to be more independent  Transfers   Equipment used: Rolling walker (2 wheeled)   Sit to Stand: Supervision              Balance                                           ADL either performed or assessed with clinical judgement   ADL                                   Tub/ Shower Transfer: Walk-in shower;Min guard;Ambulation     General ADL Comments: practiced shower transfer and gave her handout with sequence     Vision       Perception     Praxis      Cognition Arousal/Alertness: Awake/alert Behavior During Therapy: WFL for tasks assessed/performed Overall Cognitive Status: Within Functional Limits for tasks assessed                                          Exercises     Shoulder Instructions       General Comments      Pertinent Vitals/ Pain       Pain Score: 4  Pain Location: mostly R knee Pain Descriptors / Indicators: Sore Pain Intervention(s): Limited activity within patient's tolerance;Monitored during session;Premedicated before session;Repositioned;Ice applied  Home Living                                          Prior Functioning/Environment               Frequency  Min 2X/week        Progress Toward Goals  OT Goals(current goals can now be found in the care plan section)  Progress towards OT goals: Progressing toward goals(does not need further OT)     Plan      Co-evaluation                 AM-PAC PT "6 Clicks" Daily Activity     Outcome Measure   Help from another person eating meals?: None Help from another person taking care of personal grooming?: A Little Help from another person toileting, which includes using toliet, bedpan, or urinal?: A Little Help from another person bathing (including washing, rinsing, drying)?: A Little Help from another  person to put on and taking off regular upper body clothing?: A Little Help from another person to put on and taking off regular lower body clothing?: A Little 6 Click Score: 19    End of Session    OT Visit Diagnosis: Pain Pain - Right/Left: Right Pain - part of body: Hip   Activity Tolerance Patient limited by pain   Patient Left in chair;with call bell/phone within reach   Nurse Communication          Time: 4970-2637 OT Time Calculation (min): 16 min  Charges: OT General Charges $OT Visit: 1 Visit OT Treatments $Self Care/Home Management : 8-22 mins  Lesle Chris, OTR/L [phone] 05/11/2017   [place] 05/11/2017, 12:05 PM

## 2017-05-15 NOTE — Discharge Summary (Signed)
Physician Discharge Summary  Patient ID: [name] MRN: [id number] DOB/AGE: [birth year] 71 y.o.  Admit date: 05/08/2017 Discharge date: 05/11/2017   Procedures:  Procedure(s) (LRB): RIGHT TOTAL HIP ARTHROPLASTY ANTERIOR APPROACH (Right)  Attending Physician:  Dr. Paralee Wood   Admission Diagnoses:   Right hip primary OA / pain  Discharge Diagnoses:  Principal Problem:   S/P right THA, AA Active Problems:   Overweight (BMI 25.0-29.9)  Past Medical History:  Diagnosis Date  . Congenital musculoskeletal deformity of spine   . Hypertension   . Incontinence of bowel 09/2013  . Lumbago   . Lumbosacral spondylosis without myelopathy   . Sleep disorder    uses gabapentin at bedtime   . Spinal stenosis, lumbar region, without neurogenic claudication     HPI:    [name], 72 y.o. female, has a history of pain and functional disability in the right hip(s) due to arthritis and patient has failed non-surgical conservative treatments for greater than 12 weeks to include NSAID's and/or analgesics, use of assistive devices and activity modification.  Onset of symptoms was gradual starting ~2 years ago with gradually worsening course since that time.The patient noted no past surgery on the right hip(s).  Patient currently rates pain in the right hip at 9 out of 10 with activity. Patient has night pain, worsening of pain with activity and weight bearing, trendelenberg gait, pain that interfers with activities of daily living and pain with passive range of motion. Patient has evidence of periarticular osteophytes and joint space narrowing by imaging studies. This condition presents safety issues increasing the risk of falls. There is no current active infection.  Risks, benefits and expectations were discussed with the patient.  Risks including but not limited to the risk of anesthesia, blood clots, nerve damage, blood vessel damage, failure of the prosthesis, infection and up to and  including death.  Patient understand the risks, benefits and expectations and wishes to proceed with surgery.   PCP: Suzanne Austin, MD   Discharged Condition: good  Hospital Course:  Patient underwent the above stated procedure on 05/08/2017. Patient tolerated the procedure well and brought to the recovery room in good condition and subsequently to the floor.  POD #1 BP: 99/68 ; Pulse: 78 ; Temp: 98.1 F (36.7 C) ; Resp: 16 Patient reports pain as mild, pain controlled.  No events throughout the night. Ready to work with PT. Difficulty with pain and blood pressure.  Stayed the night to ensure safety for eventual d/c home.  Dorsiflexion/plantar flexion intact, incision: dressing C/D/I, no cellulitis present and compartment soft.   LABS  Basename    HGB     9.9  HCT     30.6   POD #2  BP: 117/72 ; Pulse: 94 ; Temp: 99.1 F (37.3 C) ; Resp: 18 Patient reports pain as severe, c/o significant muscle spasms.  Difficulty with pain and ambulation to get toilet to urinate.  She is frustrated and had some anxiety with regards to not doing as well as she felt she should be at this time. She did have a fever last night, but this has since decreased after doing the incentive spirometer. Dorsiflexion/plantar flexion intact, incision: dressing C/D/I, no cellulitis present and compartment soft.   LABS  Basename    HGB     10.0  HCT     30.6   POD #3  BP: 100/66 ; Pulse: 84 ; Temp: 98.5 F (36.9 C) ; Resp: 15 Patient reports  pain as moderate.  Still challenged with bed mobility.  Worried about pain control and function.  WOrked better with PT.  Ready to be discharged home. Neurovascular intact and incision: dressing C/D/I.   LABS   No new labs   Discharge Exam: General appearance: alert, cooperative and no distress Extremities: Homans sign is negative, no sign of DVT, no edema, redness or tenderness in the calves or thighs and no ulcers, gangrene or trophic changes  Disposition:  Home with  follow up in 2 weeks   Follow-up Information    Suzanne Cancel, MD. Schedule an appointment as soon as possible for a visit in 2 weeks.   Specialty:  Orthopedic Surgery Contact information: [address] [place] [address] [phone]           Discharge Instructions    Call MD / Call 911   Complete by:  As directed    If you experience chest pain or shortness of breath, CALL 911 and be transported to the hospital emergency room.  If you develope a fever above 101 F, pus (white drainage) or increased drainage or redness at the wound, or calf pain, call your surgeon's office.   Change dressing   Complete by:  As directed    Maintain surgical dressing until follow up in the clinic. If the edges start to pull up, may reinforce with tape. If the dressing is no longer working, may remove and cover with gauze and tape, but must keep the area dry and clean.  Call with any questions or concerns.   Constipation Prevention   Complete by:  As directed    Drink plenty of fluids.  Prune juice may be helpful.  You may use a stool softener, such as Colace (over the counter) 100 mg twice a day.  Use MiraLax (over the counter) for constipation as needed.   Diet - low sodium heart healthy   Complete by:  As directed    Discharge instructions   Complete by:  As directed    Maintain surgical dressing until follow up in the clinic. If the edges start to pull up, may reinforce with tape. If the dressing is no longer working, may remove and cover with gauze and tape, but must keep the area dry and clean.  Follow up in 2 [facility] at [facility]. Call with any questions or concerns.   Increase activity slowly as tolerated   Complete by:  As directed    Weight bearing as tolerated with assist device (walker, cane, etc) as directed, use it as long as suggested by your surgeon or therapist, typically at least 4-6 weeks.   TED hose   Complete by:  As directed    Use stockings (TED  hose) for 2 weeks on both leg(s).  You may remove them at night for sleeping.      Allergies as of 05/11/2017      Reactions   Lisinopril    cough      Medication List    TAKE these medications   acetaminophen 500 MG tablet Commonly known as:  TYLENOL Take 2 tablets (1,000 mg total) by mouth every 8 (eight) hours.   aspirin 81 MG chewable tablet Commonly known as:  ASPIRIN CHILDRENS Chew 1 tablet (81 mg total) by mouth 2 (two) times daily. Take for 4 weeks, then resume regular dose.   BIOFREEZE EX Apply 1 application topically daily as needed (knee pain).   buPROPion 150 MG 24 hr tablet  Commonly known as:  WELLBUTRIN XL Take 300 mg by mouth daily.   calcium carbonate 1250 (500 Ca) MG tablet Commonly known as:  OS-CAL - dosed in mg of elemental calcium Take 1 tablet by mouth daily.   cetirizine 10 MG tablet Commonly known as:  ZYRTEC Take 10 mg by mouth daily as needed for allergies.   clonazePAM 0.5 MG tablet Commonly known as:  KLONOPIN Take 1 mg by mouth at bedtime.   docusate sodium 100 MG capsule Commonly known as:  COLACE Take 1 capsule (100 mg total) by mouth 2 (two) times daily.   ferrous sulfate 325 (65 FE) MG tablet Commonly known as:  FERROUSUL Take 1 tablet (325 mg total) by mouth 3 (three) times daily with meals.   fluticasone 50 MCG/ACT nasal spray Commonly known as:  FLONASE Place 1 spray into both nostrils at bedtime as needed for allergies or rhinitis.   gabapentin 600 MG tablet Commonly known as:  NEURONTIN Take 1 tablet (600 mg total) by mouth daily. 3 tabs QHS What changed:    how much to take  when to take this  additional instructions   losartan 100 MG tablet Commonly known as:  COZAAR Take 50 mg by mouth daily. Patient reports that she now takes 1/2 of tablet, or 50mg  once a day ; reports her PCP Dr Inda Merlin is aware of this   methocarbamol 500 MG tablet Commonly known as:  ROBAXIN Take 1 tablet (500 mg total) by mouth every 6  (six) hours as needed for muscle spasms.   oxyCODONE 5 MG immediate release tablet Commonly known as:  Oxy IR/ROXICODONE Take 1-2 tablets (5-10 mg total) by mouth every 4 (four) hours as needed for moderate pain or severe pain. What changed:    medication strength  how much to take  when to take this  reasons to take this   polyethylene glycol packet Commonly known as:  MIRALAX / GLYCOLAX Take 17 g by mouth 2 (two) times daily.   rosuvastatin 20 MG tablet Commonly known as:  CRESTOR Take 20 mg by mouth at bedtime.   Vitamin D (Ergocalciferol) 50000 units Caps capsule Commonly known as:  DRISDOL Take 50,000 Units by mouth every Thursday.            Discharge Care Instructions  (From admission, onward)        Start     Ordered   05/09/17 0000  Change dressing    Comments:  Maintain surgical dressing until follow up in the clinic. If the edges start to pull up, may reinforce with tape. If the dressing is no longer working, may remove and cover with gauze and tape, but must keep the area dry and clean.  Call with any questions or concerns.   05/09/17 7741       Signed: [place]. Ludene Stokke   PA-C  05/15/2017, 1:29 PM

## 2017-05-17 DIAGNOSIS — Z96641 Presence of right artificial hip joint: Secondary | ICD-10-CM | POA: Diagnosis not present

## 2017-05-17 DIAGNOSIS — Z471 Aftercare following joint replacement surgery: Secondary | ICD-10-CM | POA: Diagnosis not present

## 2017-05-23 DIAGNOSIS — M4325 Fusion of spine, thoracolumbar region: Secondary | ICD-10-CM | POA: Diagnosis not present

## 2017-05-23 DIAGNOSIS — M25551 Pain in right hip: Secondary | ICD-10-CM | POA: Diagnosis not present

## 2017-05-23 DIAGNOSIS — M545 Low back pain: Secondary | ICD-10-CM | POA: Diagnosis not present

## 2017-06-07 DIAGNOSIS — M1990 Unspecified osteoarthritis, unspecified site: Secondary | ICD-10-CM | POA: Diagnosis not present

## 2017-06-07 DIAGNOSIS — E7800 Pure hypercholesterolemia, unspecified: Secondary | ICD-10-CM | POA: Diagnosis not present

## 2017-06-07 DIAGNOSIS — I10 Essential (primary) hypertension: Secondary | ICD-10-CM | POA: Diagnosis not present

## 2017-06-20 DIAGNOSIS — Z471 Aftercare following joint replacement surgery: Secondary | ICD-10-CM | POA: Diagnosis not present

## 2017-06-20 DIAGNOSIS — Z96641 Presence of right artificial hip joint: Secondary | ICD-10-CM | POA: Diagnosis not present

## 2017-07-05 DIAGNOSIS — Z96641 Presence of right artificial hip joint: Secondary | ICD-10-CM | POA: Diagnosis not present

## 2017-07-05 DIAGNOSIS — M5410 Radiculopathy, site unspecified: Secondary | ICD-10-CM | POA: Diagnosis not present

## 2017-07-05 DIAGNOSIS — M25552 Pain in left hip: Secondary | ICD-10-CM | POA: Diagnosis not present

## 2017-07-05 DIAGNOSIS — Z471 Aftercare following joint replacement surgery: Secondary | ICD-10-CM | POA: Diagnosis not present

## 2017-07-30 DIAGNOSIS — F4321 Adjustment disorder with depressed mood: Secondary | ICD-10-CM | POA: Diagnosis not present

## 2017-08-01 DIAGNOSIS — Z96641 Presence of right artificial hip joint: Secondary | ICD-10-CM | POA: Diagnosis not present

## 2017-08-01 DIAGNOSIS — Z471 Aftercare following joint replacement surgery: Secondary | ICD-10-CM | POA: Diagnosis not present

## 2017-08-03 ENCOUNTER — Other Ambulatory Visit: Payer: Self-pay | Admitting: Family Medicine

## 2017-08-03 DIAGNOSIS — Z1231 Encounter for screening mammogram for malignant neoplasm of breast: Secondary | ICD-10-CM

## 2017-08-23 DIAGNOSIS — I10 Essential (primary) hypertension: Secondary | ICD-10-CM | POA: Diagnosis not present

## 2017-08-23 DIAGNOSIS — M4325 Fusion of spine, thoracolumbar region: Secondary | ICD-10-CM | POA: Diagnosis not present

## 2017-08-23 DIAGNOSIS — M545 Low back pain: Secondary | ICD-10-CM | POA: Diagnosis not present

## 2017-08-23 DIAGNOSIS — Z6827 Body mass index (BMI) 27.0-27.9, adult: Secondary | ICD-10-CM | POA: Diagnosis not present

## 2017-08-27 DIAGNOSIS — M545 Low back pain: Secondary | ICD-10-CM | POA: Diagnosis not present

## 2017-08-27 DIAGNOSIS — M6281 Muscle weakness (generalized): Secondary | ICD-10-CM | POA: Diagnosis not present

## 2017-08-27 DIAGNOSIS — R262 Difficulty in walking, not elsewhere classified: Secondary | ICD-10-CM | POA: Diagnosis not present

## 2017-08-27 DIAGNOSIS — M25551 Pain in right hip: Secondary | ICD-10-CM | POA: Diagnosis not present

## 2017-08-31 DIAGNOSIS — R262 Difficulty in walking, not elsewhere classified: Secondary | ICD-10-CM | POA: Diagnosis not present

## 2017-08-31 DIAGNOSIS — M25551 Pain in right hip: Secondary | ICD-10-CM | POA: Diagnosis not present

## 2017-08-31 DIAGNOSIS — M545 Low back pain: Secondary | ICD-10-CM | POA: Diagnosis not present

## 2017-08-31 DIAGNOSIS — M6281 Muscle weakness (generalized): Secondary | ICD-10-CM | POA: Diagnosis not present

## 2017-09-04 DIAGNOSIS — M6281 Muscle weakness (generalized): Secondary | ICD-10-CM | POA: Diagnosis not present

## 2017-09-04 DIAGNOSIS — M545 Low back pain: Secondary | ICD-10-CM | POA: Diagnosis not present

## 2017-09-04 DIAGNOSIS — M25551 Pain in right hip: Secondary | ICD-10-CM | POA: Diagnosis not present

## 2017-09-04 DIAGNOSIS — R262 Difficulty in walking, not elsewhere classified: Secondary | ICD-10-CM | POA: Diagnosis not present

## 2017-09-07 DIAGNOSIS — M25551 Pain in right hip: Secondary | ICD-10-CM | POA: Diagnosis not present

## 2017-09-07 DIAGNOSIS — M6281 Muscle weakness (generalized): Secondary | ICD-10-CM | POA: Diagnosis not present

## 2017-09-07 DIAGNOSIS — M545 Low back pain: Secondary | ICD-10-CM | POA: Diagnosis not present

## 2017-09-07 DIAGNOSIS — R262 Difficulty in walking, not elsewhere classified: Secondary | ICD-10-CM | POA: Diagnosis not present

## 2017-09-11 ENCOUNTER — Ambulatory Visit
Admission: RE | Admit: 2017-09-11 | Discharge: 2017-09-11 | Disposition: A | Discharge status: Home / Self Care | Payer: Medicare Other | Source: Ambulatory Visit | Attending: Family Medicine | Admitting: Family Medicine

## 2017-09-11 DIAGNOSIS — Z1231 Encounter for screening mammogram for malignant neoplasm of breast: Secondary | ICD-10-CM | POA: Diagnosis not present

## 2017-09-12 DIAGNOSIS — R262 Difficulty in walking, not elsewhere classified: Secondary | ICD-10-CM | POA: Diagnosis not present

## 2017-09-12 DIAGNOSIS — M25551 Pain in right hip: Secondary | ICD-10-CM | POA: Diagnosis not present

## 2017-09-12 DIAGNOSIS — M545 Low back pain: Secondary | ICD-10-CM | POA: Diagnosis not present

## 2017-09-12 DIAGNOSIS — M6281 Muscle weakness (generalized): Secondary | ICD-10-CM | POA: Diagnosis not present

## 2017-09-14 DIAGNOSIS — M545 Low back pain: Secondary | ICD-10-CM | POA: Diagnosis not present

## 2017-09-14 DIAGNOSIS — M6281 Muscle weakness (generalized): Secondary | ICD-10-CM | POA: Diagnosis not present

## 2017-09-14 DIAGNOSIS — R262 Difficulty in walking, not elsewhere classified: Secondary | ICD-10-CM | POA: Diagnosis not present

## 2017-09-14 DIAGNOSIS — M25551 Pain in right hip: Secondary | ICD-10-CM | POA: Diagnosis not present

## 2017-09-18 DIAGNOSIS — M25551 Pain in right hip: Secondary | ICD-10-CM | POA: Diagnosis not present

## 2017-09-18 DIAGNOSIS — R262 Difficulty in walking, not elsewhere classified: Secondary | ICD-10-CM | POA: Diagnosis not present

## 2017-09-18 DIAGNOSIS — M545 Low back pain: Secondary | ICD-10-CM | POA: Diagnosis not present

## 2017-09-18 DIAGNOSIS — M6281 Muscle weakness (generalized): Secondary | ICD-10-CM | POA: Diagnosis not present

## 2017-09-20 DIAGNOSIS — M545 Low back pain: Secondary | ICD-10-CM | POA: Diagnosis not present

## 2017-09-20 DIAGNOSIS — M6281 Muscle weakness (generalized): Secondary | ICD-10-CM | POA: Diagnosis not present

## 2017-09-20 DIAGNOSIS — R262 Difficulty in walking, not elsewhere classified: Secondary | ICD-10-CM | POA: Diagnosis not present

## 2017-09-20 DIAGNOSIS — M25551 Pain in right hip: Secondary | ICD-10-CM | POA: Diagnosis not present

## 2017-09-26 DIAGNOSIS — M6281 Muscle weakness (generalized): Secondary | ICD-10-CM | POA: Diagnosis not present

## 2017-09-26 DIAGNOSIS — R262 Difficulty in walking, not elsewhere classified: Secondary | ICD-10-CM | POA: Diagnosis not present

## 2017-09-26 DIAGNOSIS — M25551 Pain in right hip: Secondary | ICD-10-CM | POA: Diagnosis not present

## 2017-09-26 DIAGNOSIS — M545 Low back pain: Secondary | ICD-10-CM | POA: Diagnosis not present

## 2017-10-02 DIAGNOSIS — M545 Low back pain: Secondary | ICD-10-CM | POA: Diagnosis not present

## 2017-10-02 DIAGNOSIS — M25551 Pain in right hip: Secondary | ICD-10-CM | POA: Diagnosis not present

## 2017-10-02 DIAGNOSIS — M6281 Muscle weakness (generalized): Secondary | ICD-10-CM | POA: Diagnosis not present

## 2017-10-02 DIAGNOSIS — R262 Difficulty in walking, not elsewhere classified: Secondary | ICD-10-CM | POA: Diagnosis not present

## 2017-10-04 DIAGNOSIS — M419 Scoliosis, unspecified: Secondary | ICD-10-CM | POA: Diagnosis not present

## 2017-10-04 DIAGNOSIS — M25551 Pain in right hip: Secondary | ICD-10-CM | POA: Diagnosis not present

## 2017-10-04 DIAGNOSIS — Z6827 Body mass index (BMI) 27.0-27.9, adult: Secondary | ICD-10-CM | POA: Diagnosis not present

## 2017-10-04 DIAGNOSIS — M7551 Bursitis of right shoulder: Secondary | ICD-10-CM | POA: Diagnosis not present

## 2017-10-04 DIAGNOSIS — M6281 Muscle weakness (generalized): Secondary | ICD-10-CM | POA: Diagnosis not present

## 2017-10-04 DIAGNOSIS — M545 Low back pain: Secondary | ICD-10-CM | POA: Diagnosis not present

## 2017-10-04 DIAGNOSIS — M4325 Fusion of spine, thoracolumbar region: Secondary | ICD-10-CM | POA: Diagnosis not present

## 2017-10-04 DIAGNOSIS — R262 Difficulty in walking, not elsewhere classified: Secondary | ICD-10-CM | POA: Diagnosis not present

## 2017-10-09 DIAGNOSIS — M25551 Pain in right hip: Secondary | ICD-10-CM | POA: Diagnosis not present

## 2017-10-09 DIAGNOSIS — M545 Low back pain: Secondary | ICD-10-CM | POA: Diagnosis not present

## 2017-10-09 DIAGNOSIS — M6281 Muscle weakness (generalized): Secondary | ICD-10-CM | POA: Diagnosis not present

## 2017-10-09 DIAGNOSIS — R262 Difficulty in walking, not elsewhere classified: Secondary | ICD-10-CM | POA: Diagnosis not present

## 2017-10-11 DIAGNOSIS — R262 Difficulty in walking, not elsewhere classified: Secondary | ICD-10-CM | POA: Diagnosis not present

## 2017-10-11 DIAGNOSIS — M545 Low back pain: Secondary | ICD-10-CM | POA: Diagnosis not present

## 2017-10-11 DIAGNOSIS — M25551 Pain in right hip: Secondary | ICD-10-CM | POA: Diagnosis not present

## 2017-10-11 DIAGNOSIS — M6281 Muscle weakness (generalized): Secondary | ICD-10-CM | POA: Diagnosis not present

## 2017-10-16 DIAGNOSIS — R262 Difficulty in walking, not elsewhere classified: Secondary | ICD-10-CM | POA: Diagnosis not present

## 2017-10-16 DIAGNOSIS — M545 Low back pain: Secondary | ICD-10-CM | POA: Diagnosis not present

## 2017-10-16 DIAGNOSIS — M25551 Pain in right hip: Secondary | ICD-10-CM | POA: Diagnosis not present

## 2017-10-16 DIAGNOSIS — M6281 Muscle weakness (generalized): Secondary | ICD-10-CM | POA: Diagnosis not present

## 2017-10-18 DIAGNOSIS — M25551 Pain in right hip: Secondary | ICD-10-CM | POA: Diagnosis not present

## 2017-10-18 DIAGNOSIS — M545 Low back pain: Secondary | ICD-10-CM | POA: Diagnosis not present

## 2017-10-18 DIAGNOSIS — M6281 Muscle weakness (generalized): Secondary | ICD-10-CM | POA: Diagnosis not present

## 2017-10-18 DIAGNOSIS — R262 Difficulty in walking, not elsewhere classified: Secondary | ICD-10-CM | POA: Diagnosis not present

## 2017-10-22 DIAGNOSIS — M4325 Fusion of spine, thoracolumbar region: Secondary | ICD-10-CM | POA: Diagnosis not present

## 2017-10-22 DIAGNOSIS — M549 Dorsalgia, unspecified: Secondary | ICD-10-CM | POA: Diagnosis not present

## 2017-10-22 DIAGNOSIS — M545 Low back pain: Secondary | ICD-10-CM | POA: Diagnosis not present

## 2017-11-05 ENCOUNTER — Other Ambulatory Visit: Payer: Self-pay | Admitting: Orthopaedic Surgery

## 2017-11-05 DIAGNOSIS — T84296A Other mechanical complication of internal fixation device of vertebrae, initial encounter: Secondary | ICD-10-CM

## 2017-11-07 ENCOUNTER — Ambulatory Visit
Admission: RE | Admit: 2017-11-07 | Discharge: 2017-11-07 | Disposition: A | Discharge status: Home / Self Care | Payer: Medicare Other | Source: Ambulatory Visit | Attending: Orthopaedic Surgery | Admitting: Orthopaedic Surgery

## 2017-11-07 DIAGNOSIS — T84296A Other mechanical complication of internal fixation device of vertebrae, initial encounter: Secondary | ICD-10-CM

## 2017-11-07 DIAGNOSIS — M545 Low back pain: Secondary | ICD-10-CM | POA: Diagnosis not present

## 2017-11-13 DIAGNOSIS — M6281 Muscle weakness (generalized): Secondary | ICD-10-CM | POA: Diagnosis not present

## 2017-11-13 DIAGNOSIS — R262 Difficulty in walking, not elsewhere classified: Secondary | ICD-10-CM | POA: Diagnosis not present

## 2017-11-13 DIAGNOSIS — M25551 Pain in right hip: Secondary | ICD-10-CM | POA: Diagnosis not present

## 2017-11-13 DIAGNOSIS — M545 Low back pain: Secondary | ICD-10-CM | POA: Diagnosis not present

## 2017-11-14 DIAGNOSIS — M545 Low back pain: Secondary | ICD-10-CM | POA: Diagnosis not present

## 2017-11-14 DIAGNOSIS — M4325 Fusion of spine, thoracolumbar region: Secondary | ICD-10-CM | POA: Diagnosis not present

## 2017-11-15 DIAGNOSIS — R262 Difficulty in walking, not elsewhere classified: Secondary | ICD-10-CM | POA: Diagnosis not present

## 2017-11-15 DIAGNOSIS — M25551 Pain in right hip: Secondary | ICD-10-CM | POA: Diagnosis not present

## 2017-11-15 DIAGNOSIS — M6281 Muscle weakness (generalized): Secondary | ICD-10-CM | POA: Diagnosis not present

## 2017-11-15 DIAGNOSIS — M545 Low back pain: Secondary | ICD-10-CM | POA: Diagnosis not present

## 2017-11-19 DIAGNOSIS — M5136 Other intervertebral disc degeneration, lumbar region: Secondary | ICD-10-CM | POA: Diagnosis not present

## 2017-11-21 DIAGNOSIS — M25551 Pain in right hip: Secondary | ICD-10-CM | POA: Diagnosis not present

## 2017-11-21 DIAGNOSIS — M6281 Muscle weakness (generalized): Secondary | ICD-10-CM | POA: Diagnosis not present

## 2017-11-21 DIAGNOSIS — M545 Low back pain: Secondary | ICD-10-CM | POA: Diagnosis not present

## 2017-11-21 DIAGNOSIS — R262 Difficulty in walking, not elsewhere classified: Secondary | ICD-10-CM | POA: Diagnosis not present

## 2017-11-28 DIAGNOSIS — M4325 Fusion of spine, thoracolumbar region: Secondary | ICD-10-CM | POA: Diagnosis not present

## 2017-11-28 DIAGNOSIS — M545 Low back pain: Secondary | ICD-10-CM | POA: Diagnosis not present

## 2017-11-28 DIAGNOSIS — M5136 Other intervertebral disc degeneration, lumbar region: Secondary | ICD-10-CM | POA: Diagnosis not present

## 2017-11-30 DIAGNOSIS — M6281 Muscle weakness (generalized): Secondary | ICD-10-CM | POA: Diagnosis not present

## 2017-11-30 DIAGNOSIS — R262 Difficulty in walking, not elsewhere classified: Secondary | ICD-10-CM | POA: Diagnosis not present

## 2017-11-30 DIAGNOSIS — M25551 Pain in right hip: Secondary | ICD-10-CM | POA: Diagnosis not present

## 2017-11-30 DIAGNOSIS — M545 Low back pain: Secondary | ICD-10-CM | POA: Diagnosis not present

## 2017-12-04 DIAGNOSIS — R262 Difficulty in walking, not elsewhere classified: Secondary | ICD-10-CM | POA: Diagnosis not present

## 2017-12-04 DIAGNOSIS — M25551 Pain in right hip: Secondary | ICD-10-CM | POA: Diagnosis not present

## 2017-12-04 DIAGNOSIS — M545 Low back pain: Secondary | ICD-10-CM | POA: Diagnosis not present

## 2017-12-04 DIAGNOSIS — M6281 Muscle weakness (generalized): Secondary | ICD-10-CM | POA: Diagnosis not present

## 2017-12-07 DIAGNOSIS — R262 Difficulty in walking, not elsewhere classified: Secondary | ICD-10-CM | POA: Diagnosis not present

## 2017-12-07 DIAGNOSIS — M6281 Muscle weakness (generalized): Secondary | ICD-10-CM | POA: Diagnosis not present

## 2017-12-07 DIAGNOSIS — M25551 Pain in right hip: Secondary | ICD-10-CM | POA: Diagnosis not present

## 2017-12-07 DIAGNOSIS — M545 Low back pain: Secondary | ICD-10-CM | POA: Diagnosis not present

## 2017-12-11 DIAGNOSIS — M545 Low back pain: Secondary | ICD-10-CM | POA: Diagnosis not present

## 2017-12-11 DIAGNOSIS — R262 Difficulty in walking, not elsewhere classified: Secondary | ICD-10-CM | POA: Diagnosis not present

## 2017-12-11 DIAGNOSIS — M25551 Pain in right hip: Secondary | ICD-10-CM | POA: Diagnosis not present

## 2017-12-11 DIAGNOSIS — M6281 Muscle weakness (generalized): Secondary | ICD-10-CM | POA: Diagnosis not present

## 2017-12-13 DIAGNOSIS — M6281 Muscle weakness (generalized): Secondary | ICD-10-CM | POA: Diagnosis not present

## 2017-12-13 DIAGNOSIS — R262 Difficulty in walking, not elsewhere classified: Secondary | ICD-10-CM | POA: Diagnosis not present

## 2017-12-13 DIAGNOSIS — M25551 Pain in right hip: Secondary | ICD-10-CM | POA: Diagnosis not present

## 2017-12-13 DIAGNOSIS — M545 Low back pain: Secondary | ICD-10-CM | POA: Diagnosis not present

## 2017-12-17 DIAGNOSIS — M6281 Muscle weakness (generalized): Secondary | ICD-10-CM | POA: Diagnosis not present

## 2017-12-17 DIAGNOSIS — M25551 Pain in right hip: Secondary | ICD-10-CM | POA: Diagnosis not present

## 2017-12-17 DIAGNOSIS — M545 Low back pain: Secondary | ICD-10-CM | POA: Diagnosis not present

## 2017-12-17 DIAGNOSIS — R262 Difficulty in walking, not elsewhere classified: Secondary | ICD-10-CM | POA: Diagnosis not present

## 2017-12-19 DIAGNOSIS — F325 Major depressive disorder, single episode, in full remission: Secondary | ICD-10-CM | POA: Diagnosis not present

## 2017-12-19 DIAGNOSIS — E559 Vitamin D deficiency, unspecified: Secondary | ICD-10-CM | POA: Diagnosis not present

## 2017-12-19 DIAGNOSIS — I10 Essential (primary) hypertension: Secondary | ICD-10-CM | POA: Diagnosis not present

## 2017-12-19 DIAGNOSIS — Z23 Encounter for immunization: Secondary | ICD-10-CM | POA: Diagnosis not present

## 2017-12-19 DIAGNOSIS — E7800 Pure hypercholesterolemia, unspecified: Secondary | ICD-10-CM | POA: Diagnosis not present

## 2017-12-19 DIAGNOSIS — E669 Obesity, unspecified: Secondary | ICD-10-CM | POA: Diagnosis not present

## 2017-12-19 DIAGNOSIS — Z0000 Encounter for general adult medical examination without abnormal findings: Secondary | ICD-10-CM | POA: Diagnosis not present

## 2017-12-19 DIAGNOSIS — H9190 Unspecified hearing loss, unspecified ear: Secondary | ICD-10-CM | POA: Diagnosis not present

## 2017-12-21 DIAGNOSIS — Z23 Encounter for immunization: Secondary | ICD-10-CM | POA: Diagnosis not present

## 2017-12-24 DIAGNOSIS — F4321 Adjustment disorder with depressed mood: Secondary | ICD-10-CM | POA: Diagnosis not present

## 2017-12-25 DIAGNOSIS — R262 Difficulty in walking, not elsewhere classified: Secondary | ICD-10-CM | POA: Diagnosis not present

## 2017-12-25 DIAGNOSIS — M545 Low back pain: Secondary | ICD-10-CM | POA: Diagnosis not present

## 2017-12-25 DIAGNOSIS — M6281 Muscle weakness (generalized): Secondary | ICD-10-CM | POA: Diagnosis not present

## 2017-12-25 DIAGNOSIS — M25551 Pain in right hip: Secondary | ICD-10-CM | POA: Diagnosis not present

## 2017-12-26 DIAGNOSIS — M545 Low back pain: Secondary | ICD-10-CM | POA: Diagnosis not present

## 2017-12-26 DIAGNOSIS — M5136 Other intervertebral disc degeneration, lumbar region: Secondary | ICD-10-CM | POA: Diagnosis not present

## 2017-12-26 DIAGNOSIS — M7062 Trochanteric bursitis, left hip: Secondary | ICD-10-CM | POA: Diagnosis not present

## 2017-12-26 DIAGNOSIS — A084 Viral intestinal infection, unspecified: Secondary | ICD-10-CM | POA: Diagnosis not present

## 2018-01-01 DIAGNOSIS — R262 Difficulty in walking, not elsewhere classified: Secondary | ICD-10-CM | POA: Diagnosis not present

## 2018-01-01 DIAGNOSIS — M6281 Muscle weakness (generalized): Secondary | ICD-10-CM | POA: Diagnosis not present

## 2018-01-01 DIAGNOSIS — M25551 Pain in right hip: Secondary | ICD-10-CM | POA: Diagnosis not present

## 2018-01-01 DIAGNOSIS — M545 Low back pain: Secondary | ICD-10-CM | POA: Diagnosis not present

## 2018-01-03 DIAGNOSIS — M6281 Muscle weakness (generalized): Secondary | ICD-10-CM | POA: Diagnosis not present

## 2018-01-03 DIAGNOSIS — R262 Difficulty in walking, not elsewhere classified: Secondary | ICD-10-CM | POA: Diagnosis not present

## 2018-01-03 DIAGNOSIS — M25551 Pain in right hip: Secondary | ICD-10-CM | POA: Diagnosis not present

## 2018-01-03 DIAGNOSIS — M545 Low back pain: Secondary | ICD-10-CM | POA: Diagnosis not present

## 2018-01-08 DIAGNOSIS — M545 Low back pain: Secondary | ICD-10-CM | POA: Diagnosis not present

## 2018-01-08 DIAGNOSIS — R262 Difficulty in walking, not elsewhere classified: Secondary | ICD-10-CM | POA: Diagnosis not present

## 2018-01-08 DIAGNOSIS — M6281 Muscle weakness (generalized): Secondary | ICD-10-CM | POA: Diagnosis not present

## 2018-01-08 DIAGNOSIS — M25551 Pain in right hip: Secondary | ICD-10-CM | POA: Diagnosis not present

## 2018-04-18 ENCOUNTER — Other Ambulatory Visit: Payer: Self-pay | Admitting: Orthopaedic Surgery

## 2018-04-18 DIAGNOSIS — M5136 Other intervertebral disc degeneration, lumbar region: Secondary | ICD-10-CM

## 2018-04-18 DIAGNOSIS — M546 Pain in thoracic spine: Secondary | ICD-10-CM

## 2018-05-21 ENCOUNTER — Encounter: Payer: Self-pay | Admitting: Physical Medicine & Rehabilitation

## 2018-05-21 ENCOUNTER — Other Ambulatory Visit: Payer: Self-pay

## 2018-05-21 ENCOUNTER — Encounter (INDEPENDENT_AMBULATORY_CARE_PROVIDER_SITE_OTHER): Payer: Self-pay

## 2018-05-21 ENCOUNTER — Encounter: Payer: Medicare Other | Attending: Physical Medicine & Rehabilitation | Admitting: Physical Medicine & Rehabilitation

## 2018-05-21 VITALS — BP 142/91 | HR 82 | Temp 98.1°F | Ht 65.0 in | Wt 194.0 lb

## 2018-05-21 DIAGNOSIS — M961 Postlaminectomy syndrome, not elsewhere classified: Secondary | ICD-10-CM

## 2018-05-21 DIAGNOSIS — M24559 Contracture, unspecified hip: Secondary | ICD-10-CM | POA: Diagnosis present

## 2018-05-21 NOTE — Patient Instructions (Signed)
Hip flexor stretching and stationary bicycling

## 2018-05-21 NOTE — Progress Notes (Signed)
Subjective:    Patient ID: [name], female    DOB: [birth year], 73 y.o.   MRN: [id number]  HPI 73 year old female known to me from previous clinic visits.  She has a long history of thoracolumbar scoliosis with chronic low back pain.  She was treated with lumbar radiofrequency procedures for pain management as well as medication management but did not experience any significant improvements.  She was referred to orthopedic spine surgery in 2017 T9-S1 fusion per Dr Patrice Paradise, was doing very well , walking ~3 miles a day until she felt a pop and xray demonstrated hardware failure. Jan 30th, 2019 had hardware augmentation  Right THR Dr Alvan Dame, April 2019 Had PT post op   RIght leg length now using 5/8" shoe lift Patient is not sure whether the shoe lift helps her pain or not.  Her main concern right now is that she is not walking independently, she does use a cane.  She tends to lean toward the right side and forward. She also states that her husband wants her to cook meals for him every day and this is difficult for her. Her husband is approximately 30 years older than her and is not helping much in terms of housework Pain Inventory Average Pain 3 Pain Right Now 0 My pain is burning and stabbing  In the last 24 hours, has pain interfered with the following? General activity 5 Relation with others 5 Enjoyment of life 5 What TIME of day is your pain at its worst? night Sleep (in general) Fair  Pain is worse with: bending Pain improves with: medication Relief from Meds: 1  Mobility walk with assistance use a cane how many minutes can you walk? 30 ability to climb steps?  yes do you drive?  yes Do you have any goals in this area?  yes  Function Retired I need assistance with the following: household duties, shopping  Neuro/Psych bladder control problems trouble walking depression  Prior Studies Any changes since last visit?  yes CT/MRI  Physicians involved in your  care Primary care Dr. Maurice Small Neurosurgeon Max Jeremy Johann Laurence [place]. [place]   Family History  Problem Relation Age of Onset  . Dementia Mother   . Stroke Mother   . Depression Mother   . COPD Father   . Cancer Brother   . Breast cancer Neg Hx    Social History   Socioeconomic History  . Marital status: Married    Spouse name: Not on file  . Number of children: Not on file  . Years of education: Not on file  . Highest education level: Not on file  Occupational History  . Not on file  Social Needs  . Financial resource strain: Not on file  . Food insecurity:    Worry: Not on file    Inability: Not on file  . Transportation needs:    Medical: Not on file    Non-medical: Not on file  Tobacco Use  . Smoking status: Former Research scientist (life sciences)  . Smokeless tobacco: Never Used  . Tobacco comment: smoked for ayear and half in college   Substance and Sexual Activity  . Alcohol use: Yes    Alcohol/week: 3.0 standard drinks    Types: 3 Glasses of wine per week  . Drug use: Not on file  . Sexual activity: Not on file  Lifestyle  . Physical activity:    Days per week: Not on file    Minutes per session: Not on file  .  Stress: Not on file  Relationships  . Social connections:    Talks on phone: Not on file    Gets together: Not on file    Attends religious service: Not on file    Active member of club or organization: Not on file    Attends meetings of clubs or organizations: Not on file    Relationship status: Not on file  Other Topics Concern  . Not on file  Social History Narrative  . Not on file   Past Surgical History:  Procedure Laterality Date  . ANKLE SURGERY Right 2015   lengthened the achilles   . AUGMENTATION MAMMAPLASTY Bilateral 2000 or 2001 unsure    Left Implant has busted and is no longer visible  . BACK SURGERY  2015   fusion , Dr Patrice Paradise at [facility]; has 2 metal rods in place , fusion from t2 to s1   . BACK SURGERY  01/2017   Dr Rennis Harding ; repair  of cracked titanium rod in back lumbar   . bunionectomy  Bilateral 1980s   multiple   . COLONOSCOPY     with polypectomy ; q 5 years   . FOOT SURGERY  07/2011  . SHOULDER SURGERY Left 2013   rotator cuff   . TOTAL HIP ARTHROPLASTY Right 05/08/2017   Procedure: RIGHT TOTAL HIP ARTHROPLASTY ANTERIOR APPROACH;  Surgeon: Paralee Cancel, MD;  Location: [facility];  Service: Orthopedics;  Laterality: Right;  70 mins   Past Medical History:  Diagnosis Date  . Congenital musculoskeletal deformity of spine   . Hypertension   . Incontinence of bowel 09/2013  . Lumbago   . Lumbosacral spondylosis without myelopathy   . Sleep disorder    uses gabapentin at bedtime   . Spinal stenosis, lumbar region, without neurogenic claudication    BP (!) 142/91   Pulse 82   Temp 98.1 F (36.7 C)   Ht 5\' 5"  (1.651 m)   Wt 194 lb (88 kg)   SpO2 (!) 82%   BMI 32.28 kg/m   Opioid Risk Score:   Fall Risk Score:  `1  Depression screen PHQ 2/9  Depression screen [facility] 2/9 05/21/2018 02/23/2015 02/16/2015 02/08/2015 01/22/2015  Decreased Interest 1 0 0 1 -  Down, Depressed, Hopeless 1 0 0 1 1  PHQ - 2 Score 2 0 0 2 1  Altered sleeping 1 0 2 - 1  Tired, decreased energy 1 0 2 - 1  Change in appetite 0 0 0 - 0  Feeling bad or failure about yourself  1 0 0 - 0  Trouble concentrating 1 0 0 - 1  Moving slowly or fidgety/restless 0 0 0 - 0  Suicidal thoughts 0 0 0 - 0  PHQ-9 Score 6 0 4 - 4  Difficult doing work/chores Somewhat difficult - Very difficult - Somewhat difficult    Review of Systems  Constitutional: Positive for unexpected weight change.  HENT: Negative.   Eyes: Negative.   Respiratory: Negative.   Cardiovascular: Negative.   Gastrointestinal: Positive for constipation.  Endocrine: Negative.   Genitourinary: Negative.   Musculoskeletal: Negative.   Skin: Negative.   Allergic/Immunologic: Negative.   Neurological: Negative.   Hematological: Negative.   Psychiatric/Behavioral: Negative.   All  other systems reviewed and are negative.      Objective:   Physical Exam Vitals signs and nursing note reviewed.  Constitutional:      Appearance: Normal appearance. She is obese.  HENT:  Nose: No congestion.     Mouth/Throat:     Mouth: Mucous membranes are moist.  Eyes:     General: No scleral icterus.    Extraocular Movements: Extraocular movements intact.     Conjunctiva/sclera: Conjunctivae normal.     Pupils: Pupils are equal, round, and reactive to light.  Neck:     Musculoskeletal: No muscular tenderness.     Comments: Reduced cervical spine range of motion Musculoskeletal:     Thoracic back: She exhibits deformity.  Neurological:     Mental Status: She is alert and oriented to person, place, and time.     Sensory: Sensation is intact.     Motor: Weakness present. No tremor.     Coordination: Coordination is intact.     Gait: Gait abnormal.     Comments: Motor strength is 4/5 bilateral hip abductors 5 bilateral knee extensors for bilateral hip flexors 5 bilateral ankle dorsiflexors  Sensation equal bilateral L3-L4-L5 dermatome distribution  Gait is forward flexed as well as leaning toward the right.  Ambulates with out a cane short distances No evidence of toe drag or knee instability.   Psychiatric:        Mood and Affect: Affect is labile.        Behavior: Behavior normal.        Thought Content: Thought content normal.        Judgment: Judgment normal.     Comments: Cried when talking about using a cane for ambulation.        Convext right thoracolumbar scoliosis       Assessment & Plan:  1.  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  She has no residual neurologic deficits.  She has mild hip abductor and hip extensor weakness. She has moderately severe hip flexor contractures.  We discussed stretching techniques placing 1 foot flat on the ground with the knee slightly flexed and the other knee up on the table or  chair behind her to allow for hip flexor stretching.  1 minute rest a minute and repeat x3 on each side each day Continue hip abductor strengthening exercises 2.  Status post right total hip replacement overall doing well with this. 3.  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  We discussed that the hip flexor contractures would need to be corrected in order to improve her ability to ambulate without an assistive device. Physical medicine rehab follow-up in 4 to 6 weeks  Over half of the 25 min visit was spent counseling and coordinating care.

## 2018-05-28 ENCOUNTER — Telehealth: Payer: Self-pay | Admitting: Nurse Practitioner

## 2018-05-28 NOTE — Telephone Encounter (Signed)
Phone call to patient to verify medication list and allergies for myelogram procedure. Pt instructed to hold Wellbutrin for 48hrs prior to myelogram appointment time. Pt verbalized understanding. Pre and post procedure instructions reviewed with pt.

## 2018-06-18 ENCOUNTER — Other Ambulatory Visit: Payer: Medicare Other

## 2018-07-02 ENCOUNTER — Encounter: Payer: Medicare Other | Attending: Physical Medicine & Rehabilitation | Admitting: Physical Medicine & Rehabilitation

## 2018-07-02 ENCOUNTER — Other Ambulatory Visit: Payer: Self-pay

## 2018-07-02 ENCOUNTER — Encounter: Payer: Self-pay | Admitting: Physical Medicine & Rehabilitation

## 2018-07-02 VITALS — BP 129/91 | HR 94 | Temp 97.6°F | Resp 12 | Ht 65.0 in | Wt 200.0 lb

## 2018-07-02 DIAGNOSIS — Z96641 Presence of right artificial hip joint: Secondary | ICD-10-CM | POA: Diagnosis not present

## 2018-07-02 DIAGNOSIS — M961 Postlaminectomy syndrome, not elsewhere classified: Secondary | ICD-10-CM | POA: Insufficient documentation

## 2018-07-02 DIAGNOSIS — M24559 Contracture, unspecified hip: Secondary | ICD-10-CM | POA: Insufficient documentation

## 2018-07-02 DIAGNOSIS — M419 Scoliosis, unspecified: Secondary | ICD-10-CM

## 2018-07-02 NOTE — Progress Notes (Signed)
Subjective:    Patient ID: [name], female    DOB: July 05, 1945, 73 y.o.   MRN: [id number] 73 year old female with history of thoracolumbar scoliosis who failed conservative care, referred to orthopedic spine surgery in 2017 T9-S1 fusion per Dr Patrice Paradise, was doing very well , walking ~3 miles a day until she felt a pop and xray demonstrated hardware failure. Jan 30th, 2019 had hardware augmentation  Right THR Dr Alvan Dame, April 2019 Had PT post op   RIght leg length now using 5/8" shoe lift HPI  Patient continues to complain of walking bent over.  She leans toward the right as well as forward.  She has difficulty laying flat on her back. She is independent with all her self-care and mobility.  Seen by Dr Patrice Paradise who ordered Lumbar myelogram but the patient's husband told her not to get it.  Patient continues to experience weakness in the right greater than left lower extremity.  In addition she does have some numbness more in her left lower extremity.  No bowel or bladder dysfunction.   Pain Inventory Average Pain 2 Pain Right Now 2 My pain is sharp, burning, dull, stabbing, tingling and aching  In the last 24 hours, has pain interfered with the following? General activity 6 Relation with others 6 Enjoyment of life 6 What TIME of day is your pain at its worst? daytime Sleep (in general) Good  Pain is worse with: walking Pain improves with: heat/ice and medication Relief from Meds: 0  Mobility use a cane ability to climb steps?  yes do you drive?  yes  Function retired  Neuro/Psych trouble walking dizziness depression anxiety  Prior Studies n/a  Physicians involved in your care n/a   Family History  Problem Relation Age of Onset  . Dementia Mother   . Stroke Mother   . Depression Mother   . COPD Father   . Cancer Brother   . Breast cancer Neg Hx    Social History   Socioeconomic History  . Marital status: Married    Spouse name: Not on file  .  Number of children: Not on file  . Years of education: Not on file  . Highest education level: Not on file  Occupational History  . Not on file  Social Needs  . Financial resource strain: Not on file  . Food insecurity    Worry: Not on file    Inability: Not on file  . Transportation needs    Medical: Not on file    Non-medical: Not on file  Tobacco Use  . Smoking status: Former Research scientist (life sciences)  . Smokeless tobacco: Never Used  . Tobacco comment: smoked for ayear and half in college   Substance and Sexual Activity  . Alcohol use: Yes    Alcohol/week: 3.0 standard drinks    Types: 3 Glasses of wine per week  . Drug use: Not on file  . Sexual activity: Not on file  Lifestyle  . Physical activity    Days per week: Not on file    Minutes per session: Not on file  . Stress: Not on file  Relationships  . Social Herbalist on phone: Not on file    Gets together: Not on file    Attends religious service: Not on file    Active member of club or organization: Not on file    Attends meetings of clubs or organizations: Not on file    Relationship status: Not on file  Other Topics Concern  . Not on file  Social History Narrative  . Not on file   Past Surgical History:  Procedure Laterality Date  . ANKLE SURGERY Right 2015   lengthened the achilles   . AUGMENTATION MAMMAPLASTY Bilateral 2000 or 2001 unsure    Left Implant has busted and is no longer visible  . BACK SURGERY  2015   fusion , Dr Patrice Paradise at [facility]; has 2 metal rods in place , fusion from t2 to s1   . BACK SURGERY  01/2017   Dr Rennis Harding ; repair of cracked titanium rod in back lumbar   . bunionectomy  Bilateral 1980s   multiple   . COLONOSCOPY     with polypectomy ; q 5 years   . FOOT SURGERY  07/2011  . SHOULDER SURGERY Left 2013   rotator cuff   . TOTAL HIP ARTHROPLASTY Right 05/08/2017   Procedure: RIGHT TOTAL HIP ARTHROPLASTY ANTERIOR APPROACH;  Surgeon: Paralee Cancel, MD;  Location: [facility];   Service: Orthopedics;  Laterality: Right;  70 mins   Past Medical History:  Diagnosis Date  . Congenital musculoskeletal deformity of spine   . Hypertension   . Incontinence of bowel 09/2013  . Lumbago   . Lumbosacral spondylosis without myelopathy   . Sleep disorder    uses gabapentin at bedtime   . Spinal stenosis, lumbar region, without neurogenic claudication    Temp 97.6 F (36.4 C)   Resp 12   Wt 200 lb (90.7 kg)   BMI 33.28 kg/m   Opioid Risk Score:   Fall Risk Score:  `1  Depression screen PHQ 2/9  Depression screen [facility] 2/9 05/21/2018 02/23/2015 02/16/2015 02/08/2015 01/22/2015  Decreased Interest 1 0 0 1 -  Down, Depressed, Hopeless 1 0 0 1 1  PHQ - 2 Score 2 0 0 2 1  Altered sleeping 1 0 2 - 1  Tired, decreased energy 1 0 2 - 1  Change in appetite 0 0 0 - 0  Feeling bad or failure about yourself  1 0 0 - 0  Trouble concentrating 1 0 0 - 1  Moving slowly or fidgety/restless 0 0 0 - 0  Suicidal thoughts 0 0 0 - 0  PHQ-9 Score 6 0 4 - 4  Difficult doing work/chores Somewhat difficult - Very difficult - Somewhat difficult      Review of Systems     Objective:   Physical Exam Vitals signs and nursing note reviewed.  Constitutional:      Appearance: She is obese.  HENT:     Head: Normocephalic and atraumatic.  Eyes:     Extraocular Movements: Extraocular movements intact.     Conjunctiva/sclera: Conjunctivae normal.     Pupils: Pupils are equal, round, and reactive to light.  Musculoskeletal:     Comments: Patient has weakness with hip abduction bilaterally. Her posture is forward flexed and bends toward the right side. She is unable to lay flat with her hips and knees extended.  At around 30 degrees of recumbency her hips rise with knees bent off the table  Neurological:     Mental Status: She is alert and oriented to person, place, and time. Mental status is at baseline.     Comments: Patient has diminished sensation left L4 and left S1 dermatomal  distribution. Achilles reflexes diminished bilaterally. Motor strength is 3- bilateral hip abductors as well as hip flexors.  4/5 bilateral knee extensors 4/5 bilateral ankle dorsiflexors and  great toe extensors.   Psychiatric:        Mood and Affect: Mood normal.        Behavior: Behavior normal.           Assessment & Plan:  1.  Patient with history of thoracolumbar fusion for scoliosis.  She initially did quite well with this.  She had a revision due to hardware failure followed within a couple months by a right total hip replacement and since that time has not been able to ambulate without a cane.  She has seen her spine surgeon who recommended CT myelogram which the patient did not follow through on.  Dr. Alvan Dame states her hip replacement is functioning well. We discussed that her hip weakness may be from chronic poor mobility however there may be a chronic radiculopathy superimposed.  Will check EMG/NCV to further evaluate. 2.  Hip flexion contractures referral to outpatient PT at [facility]  From a pain standpoint the patient is actually doing quite well and is not requiring chronic opiates.

## 2018-07-08 ENCOUNTER — Other Ambulatory Visit: Payer: Self-pay

## 2018-07-08 ENCOUNTER — Ambulatory Visit: Payer: Medicare Other | Attending: Physical Medicine & Rehabilitation | Admitting: Physical Therapy

## 2018-07-08 ENCOUNTER — Encounter: Payer: Self-pay | Admitting: Physical Therapy

## 2018-07-08 DIAGNOSIS — R293 Abnormal posture: Secondary | ICD-10-CM | POA: Diagnosis present

## 2018-07-08 DIAGNOSIS — M6281 Muscle weakness (generalized): Secondary | ICD-10-CM

## 2018-07-08 DIAGNOSIS — R262 Difficulty in walking, not elsewhere classified: Secondary | ICD-10-CM | POA: Insufficient documentation

## 2018-07-08 DIAGNOSIS — M79605 Pain in left leg: Secondary | ICD-10-CM | POA: Insufficient documentation

## 2018-07-08 NOTE — Patient Instructions (Signed)
Access Code: [email or web address]  URL: [email or web address]  Date: 07/08/2018  Prepared by: Jari Favre   Exercises  Hooklying Small March - 10 reps - 2 sets - 1x daily - 7x weekly  Hip Flexor Stretch at Edge of Bed - 10 reps - 3 sets - 1x daily - 7x weekly

## 2018-07-08 NOTE — Therapy (Signed)
[facility] [address], [place], [place], [address] Phone: [phone]   Fax:  [phone]  Physical Therapy Evaluation  Patient Details  Name: [name] MRN: [id number] Date of Birth: [birth year] Referring Provider (PT): Letta Pate Luanna Salk, MD   Encounter Date: 07/08/2018  PT End of Session - 07/08/18 1433    Visit Number  1    Date for PT Re-Evaluation  09/30/18    PT Start Time  1330    PT Stop Time  1425    PT Time Calculation (min)  55 min    Activity Tolerance  Patient tolerated treatment well    Behavior During Therapy  [facility] for tasks assessed/performed       Past Medical History:  Diagnosis Date  . Congenital musculoskeletal deformity of spine   . Hypertension   . Incontinence of bowel 09/2013  . Lumbago   . Lumbosacral spondylosis without myelopathy   . Sleep disorder    uses gabapentin at bedtime   . Spinal stenosis, lumbar region, without neurogenic claudication     Past Surgical History:  Procedure Laterality Date  . ANKLE SURGERY Right 2015   lengthened the achilles   . AUGMENTATION MAMMAPLASTY Bilateral 2000 or 2001 unsure    Left Implant has busted and is no longer visible  . BACK SURGERY  2015   fusion , Dr Patrice Paradise at [facility]; has 2 metal rods in place , fusion from t2 to s1   . BACK SURGERY  01/2017   Dr Rennis Harding ; repair of cracked titanium rod in back lumbar   . bunionectomy  Bilateral 1980s   multiple   . COLONOSCOPY     with polypectomy ; q 5 years   . FOOT SURGERY  07/2011  . SHOULDER SURGERY Left 2013   rotator cuff   . TOTAL HIP ARTHROPLASTY Right 05/08/2017   Procedure: RIGHT TOTAL HIP ARTHROPLASTY ANTERIOR APPROACH;  Surgeon: Paralee Cancel, MD;  Location: [facility];  Service: Orthopedics;  Laterality: Right;  70 mins    There were no vitals filed for this visit.   Subjective Assessment - 07/08/18 1331    Subjective  Pt was walking 3 miles after the first  back surgery.  She did no use an AD.  She was not using a cane until that surgery.  Then she had to get back surgery revised.  Then had hip replacement on the Rt LE. She has had pain theLt thigh after this.  Pain occurs after walking about 1/4 mile and at night when lying on the left.    Limitations  Walking    How long can you walk comfortably?  1/4 mile    Diagnostic tests  scheduled for nerve conduction test    Patient Stated Goals  be able to go for walks again    Currently in Pain?  Yes    Pain Score  8     Pain Location  Leg    Pain Orientation  Left;Anterior    Pain Descriptors / Indicators  Burning;Sharp    Pain Type  Chronic pain    Pain Radiating Towards  thigh    Pain Onset  More than a month ago    Pain Frequency  Intermittent    Aggravating Factors   walking and lying on it    Pain Relieving Factors  self massage; rest and taking weight off    Multiple Pain Sites  No  [facility] PT Assessment - 07/08/18 0001      Assessment   Medical Diagnosis  M24.559 (ICD-10-CM) - Hip flexor tightness, unspecified laterality    Referring Provider (PT)  Kirsteins, Luanna Salk, MD    Prior Therapy  Yes more for her gait      Precautions   Precautions  None      Restrictions   Weight Bearing Restrictions  No      Balance Screen   Has the patient fallen in the past 6 months  No      [place] residence    Living Arrangements  Spouse/significant other      Prior Function   Level of Independence  Independent      Cognition   Overall Cognitive Status  Within Functional Limits for tasks assessed      Observation/Other Assessments   Focus on Therapeutic Outcomes (FOTO)   59%      Posture/Postural Control   Posture/Postural Control  Postural limitations    Postural Limitations  Rounded Shoulders;Increased thoracic kyphosis;Anterior pelvic tilt   scoliosis, left hip elevated     ROM / Strength   AROM / PROM / Strength  Strength;PROM       PROM   Overall PROM Comments  hip extension 10% limited      Strength   Overall Strength Comments  core weakness 4/5    Strength Assessment Site  Hip    Right/Left Hip  Right;Left    Right Hip Flexion  4-/5    Right Hip Extension  4-/5    Right Hip External Rotation   4-/5    Right Hip Internal Rotation  5/5    Right Hip ABduction  4/5    Right Hip ADduction  4/5    Left Hip Flexion  4/5    Left Hip Extension  4-/5    Left Hip External Rotation  5/5    Left Hip Internal Rotation  5/5    Left Hip ABduction  4-/5    Left Hip ADduction  4-/5      Flexibility   Soft Tissue Assessment /Muscle Length  --      Palpation   Palpation comment  hip flexors tight      Ambulation/Gait   Gait Pattern  Decreased step length - right;Decreased step length - left;Lateral trunk lean to right;Trunk flexed;Decreased stride length    Gait Comments  can ambulate without cane but usually does to take pressure off Left LE                Objective measurements completed on examination: See above findings.    Pelvic Floor Special Questions - 07/08/18 0001    Prior Pregnancies  Yes    Urinary Leakage  Yes    How often  all the time    Pad use  yes    Urinary urgency  Yes       [facility] Adult PT Treatment/Exercise - 07/08/18 0001      Self-Care   Self-Care  Other Self-Care Comments    Other Self-Care Comments   Access Code: [email or web address] educated and performed             PT Education - 07/08/18 1428    Education Details  Access Code: [email or web address]    Person(s) Educated  Patient    Methods  Explanation;Demonstration;Handout;Verbal cues    Comprehension  Verbalized understanding;Returned demonstration       PT Short Term  Goals - 07/08/18 1441      PT SHORT TERM GOAL #1   Title  ind with initial HEP    Time  2    Period  Weeks    Status  New    Target Date  07/22/18      PT SHORT TERM GOAL #2   Title  Pt will report 25% less hip pain at night    Baseline  8/10    Time  6     Period  Weeks    Status  New    Target Date  08/19/18        PT Long Term Goals - 07/08/18 1434      PT LONG TERM GOAL #1   Title  pt will be independent with HEP and consistently doing it on her own at least 5x/week    Time  12    Period  Weeks    Status  New    Target Date  09/30/18      PT LONG TERM GOAL #2   Title  Pt will be able to demonstrate hip extension ROM to neutral for improved gait and upright posture    Time  12    Period  Weeks    Status  New    Target Date  09/30/18      PT LONG TERM GOAL #3   Title  pt will report increased walking tolerance to >19minutes without increased pain    Time  12    Period  Weeks    Status  New    Target Date  09/30/18      PT LONG TERM GOAL #4   Title  pt will decrease FOTO to 47% or less    Baseline  56%    Time  12    Period  Weeks    Status  New    Target Date  09/30/18             Plan - 07/08/18 1445    Clinical Impression Statement  Pt was referred to skilled PT due to pain in left thigh.  Pt had back and Rt hip surgeries and since last surgery has had difficulty walking due to hip pain.  Pt demonstrates posture and gait abnormalities mentioned above.  Pt has LE and core weakness.  She currently uses a straight cane and wears an aspen back brace.  Pt also reports she has had urinary leakage for a long time but that has gotten worse.  No direct pelvic assessment was completed but based on history she most likely has pelvic floor weakness.  Pt has tight hip flexors with hip extension ROM limited by about 10%.  Pt will benefit from skilled PT to address all previously mentioned impairments so she can improve her ability to take care of her home and return to healthy exercise routine    Personal Factors and Comorbidities  Comorbidity 2    Comorbidities  back surgery, THA, sleep dysfunction    Examination-Activity Limitations  Bed Mobility;Locomotion Level;Sleep    Stability/Clinical Decision Making   Evolving/Moderate complexity    Clinical Decision Making  Moderate    Rehab Potential  Excellent    PT Frequency  3x / week   reducing to 2 and 1x throughout POC   PT Duration  12 weeks    PT Treatment/Interventions  ADLs/Self Care Home Management;Biofeedback;Cryotherapy;Electrical Stimulation;Iontophoresis 4mg /ml Dexamethasone;Moist Heat;Gait training;Stair training;Functional mobility training;Therapeutic activities;Therapeutic exercise;Neuromuscular re-education;Patient/family education;Manual techniques;Dry needling;Passive range of  motion;Taping;Scar mobilization    PT Next Visit Plan  f/u on shoe lift, f/u on initial HEP, core and hip strength, hip flexor stretching    PT Home Exercise Plan  Access Code: [email or web address]    Recommended Other Services  patient to f/u with insert in shoe to see if it can be raised up a little higher    Consulted and Agree with Plan of Care  Patient       Patient will benefit from skilled therapeutic intervention in order to improve the following deficits and impairments:  Abnormal gait, Difficulty walking, Impaired flexibility, Decreased strength, Decreased range of motion, Pain, Postural dysfunction  Visit Diagnosis: 1. Pain in left leg   2. Abnormal posture   3. Difficulty in walking, not elsewhere classified   4. Muscle weakness (generalized)        Problem List Patient Active Problem List   Diagnosis Date Noted  . Hip contracture, unspecified laterality 07/02/2018  . Overweight (BMI 25.0-29.9) 05/09/2017  . S/P right THA, AA 05/08/2017  . Chronic bilateral low back pain without sciatica 04/05/2015  . Disorder of sacroiliac joint 12/22/2014  . Chronic low back pain 12/22/2014  . Acquired scoliosis 11/27/2011  . Lumbosacral spondylosis without myelopathy 05/22/2011    Jule Ser, PT 07/08/2018, 3:10 PM  [facility] [address] [place], [place], [address] Phone:  [phone]   Fax:  [phone]  Name: Daliya Parchment MRN: [id number] Date of Birth: 04-May-1945

## 2018-07-10 ENCOUNTER — Ambulatory Visit: Payer: Medicare Other | Attending: Physical Medicine & Rehabilitation | Admitting: Physical Therapy

## 2018-07-10 ENCOUNTER — Other Ambulatory Visit: Payer: Self-pay

## 2018-07-10 ENCOUNTER — Encounter: Payer: Self-pay | Admitting: Physical Therapy

## 2018-07-10 DIAGNOSIS — R262 Difficulty in walking, not elsewhere classified: Secondary | ICD-10-CM | POA: Diagnosis present

## 2018-07-10 DIAGNOSIS — R293 Abnormal posture: Secondary | ICD-10-CM

## 2018-07-10 DIAGNOSIS — M79605 Pain in left leg: Secondary | ICD-10-CM | POA: Diagnosis present

## 2018-07-10 DIAGNOSIS — M6281 Muscle weakness (generalized): Secondary | ICD-10-CM | POA: Diagnosis present

## 2018-07-10 NOTE — Therapy (Signed)
[facility] [address], [place], [place], [address] Phone: [phone]   Fax:  [phone]  Physical Therapy Treatment  Patient Details  Name: [name] MRN: [id number] Date of Birth: 06-Sep-1945 Referring Provider (PT): Letta Pate Luanna Salk, MD   Encounter Date: 07/10/2018  PT End of Session - 07/10/18 1358    Visit Number  2    Date for PT Re-Evaluation  09/30/18    PT Start Time  4268    PT Stop Time  1447    PT Time Calculation (min)  49 min    Activity Tolerance  Patient tolerated treatment well    Behavior During Therapy  [facility] for tasks assessed/performed       Past Medical History:  Diagnosis Date  . Congenital musculoskeletal deformity of spine   . Hypertension   . Incontinence of bowel 09/2013  . Lumbago   . Lumbosacral spondylosis without myelopathy   . Sleep disorder    uses gabapentin at bedtime   . Spinal stenosis, lumbar region, without neurogenic claudication     Past Surgical History:  Procedure Laterality Date  . ANKLE SURGERY Right 2015   lengthened the achilles   . AUGMENTATION MAMMAPLASTY Bilateral 2000 or 2001 unsure    Left Implant has busted and is no longer visible  . BACK SURGERY  2015   fusion , Dr Patrice Paradise at [facility]; has 2 metal rods in place , fusion from t2 to s1   . BACK SURGERY  01/2017   Dr Rennis Harding ; repair of cracked titanium rod in back lumbar   . bunionectomy  Bilateral 1980s   multiple   . COLONOSCOPY     with polypectomy ; q 5 years   . FOOT SURGERY  07/2011  . SHOULDER SURGERY Left 2013   rotator cuff   . TOTAL HIP ARTHROPLASTY Right 05/08/2017   Procedure: RIGHT TOTAL HIP ARTHROPLASTY ANTERIOR APPROACH;  Surgeon: Paralee Cancel, MD;  Location: [facility];  Service: Orthopedics;  Laterality: Right;  70 mins    There were no vitals filed for this visit.  Subjective Assessment - 07/10/18 1359    Subjective  I am having difficulty with my hip flexor  stretching at home. I am walking today with ankle weights on because it makes my legs feel better.    Currently in Pain?  Yes    Pain Score  3     Pain Location  Back    Pain Orientation  Right    Pain Descriptors / Indicators  Discomfort                       [facility] Adult PT Treatment/Exercise - 07/10/18 0001      Lumbar Exercises: Supine   Bent Knee Raise  5 reps;3 seconds    Bent Knee Raise Limitations  weak in lower abs, Pt did not understand purpose of the exercise. PTA re-instructed  and pt could demo.       Knee/Hip Exercises: Stretches   Hip Flexor Stretch  Both;2 reps;20 seconds    Hip Flexor Stretch Limitations  VC/TC for technique   Standing at step and in supine     Manual Therapy   Soft tissue mobilization  Lt hip flexor, quad group, lateral hip    Myofascial Release  Lt quad             PT Education - 07/10/18 1456    Education Details  Why it is not a good idea to walk with 3# weights on her ankles. Posture    Person(s) Educated  Patient    Methods  Explanation    Comprehension  Verbalized understanding       PT Short Term Goals - 07/08/18 1441      PT SHORT TERM GOAL #1   Title  ind with initial HEP    Time  2    Period  Weeks    Status  New    Target Date  07/22/18      PT SHORT TERM GOAL #2   Title  Pt will report 25% less hip pain at night    Baseline  8/10    Time  6    Period  Weeks    Status  New    Target Date  08/19/18        PT Long Term Goals - 07/08/18 1434      PT LONG TERM GOAL #1   Title  pt will be independent with HEP and consistently doing it on her own at least 5x/week    Time  12    Period  Weeks    Status  New    Target Date  09/30/18      PT LONG TERM GOAL #2   Title  Pt will be able to demonstrate hip extension ROM to neutral for improved gait and upright posture    Time  12    Period  Weeks    Status  New    Target Date  09/30/18      PT LONG TERM GOAL #3   Title  pt will report increased  walking tolerance to >31minutes without increased pain    Time  12    Period  Weeks    Status  New    Target Date  09/30/18      PT LONG TERM GOAL #4   Title  pt will decrease FOTO to 47% or less    Baseline  56%    Time  12    Period  Weeks    Status  New    Target Date  09/30/18            Plan - 07/10/18 1420    Clinical Impression Statement  Pt presents today with a few concerns: she has a difficulty with her home stretches and she was unsure how her other exercises related to her LT thigh pain. Pt was re-instructed in her exercises including a modification to her hip flexors. Pt with increased resting tone in her left quad. This appeared to improved with myofascial worl and gentle soft tissue work.    Personal Factors and Comorbidities  Comorbidity 2    Comorbidities  back surgery, THA, sleep dysfunction    Examination-Activity Limitations  Bed Mobility;Locomotion Level;Sleep    Stability/Clinical Decision Making  Evolving/Moderate complexity    Rehab Potential  Excellent    PT Frequency  3x / week    PT Duration  12 weeks    PT Treatment/Interventions  ADLs/Self Care Home Management;Biofeedback;Cryotherapy;Electrical Stimulation;Iontophoresis 4mg /ml Dexamethasone;Moist Heat;Gait training;Stair training;Functional mobility training;Therapeutic activities;Therapeutic exercise;Neuromuscular re-education;Patient/family education;Manual techniques;Dry needling;Passive range of motion;Taping;Scar mobilization    PT Next Visit Plan  Work on LT quads, hips. progress her core strength if she is ready.    PT Home Exercise Plan  Access Code: [email or web address]    Consulted and Agree with Plan of Care  Patient  Patient will benefit from skilled therapeutic intervention in order to improve the following deficits and impairments:  Abnormal gait, Difficulty walking, Impaired flexibility, Decreased strength, Decreased range of motion, Pain, Postural dysfunction  Visit Diagnosis: 1. Pain in  left leg   2. Abnormal posture   3. Difficulty in walking, not elsewhere classified   4. Muscle weakness (generalized)        Problem List Patient Active Problem List   Diagnosis Date Noted  . Hip contracture, unspecified laterality 07/02/2018  . Overweight (BMI 25.0-29.9) 05/09/2017  . S/P right THA, AA 05/08/2017  . Chronic bilateral low back pain without sciatica 04/05/2015  . Disorder of sacroiliac joint 12/22/2014  . Chronic low back pain 12/22/2014  . Acquired scoliosis 11/27/2011  . Lumbosacral spondylosis without myelopathy 05/22/2011    [name], PTA 07/10/2018, 2:56 PM  [facility] [address], [place], [place], [address] Phone: [phone]   Fax:  [phone]  Name: [name] MRN: [id number] Date of Birth: [birth year]

## 2018-07-15 ENCOUNTER — Ambulatory Visit: Payer: Medicare Other | Admitting: Physical Therapy

## 2018-07-15 ENCOUNTER — Encounter: Payer: Self-pay | Admitting: Physical Therapy

## 2018-07-15 ENCOUNTER — Other Ambulatory Visit: Payer: Self-pay

## 2018-07-15 DIAGNOSIS — M6281 Muscle weakness (generalized): Secondary | ICD-10-CM

## 2018-07-15 DIAGNOSIS — R293 Abnormal posture: Secondary | ICD-10-CM

## 2018-07-15 DIAGNOSIS — M79605 Pain in left leg: Secondary | ICD-10-CM

## 2018-07-15 DIAGNOSIS — R262 Difficulty in walking, not elsewhere classified: Secondary | ICD-10-CM

## 2018-07-15 NOTE — Therapy (Signed)
[facility] [address], [place], [place], [address] Phone: [phone]   Fax:  [phone]  Physical Therapy Treatment  Patient Details  Name: [name] MRN: [id number] Date of Birth: 26-May-1945 Referring Provider (PT): Letta Pate Luanna Salk, MD   Encounter Date: 07/15/2018  PT End of Session - 07/15/18 1405    Visit Number  3    Date for PT Re-Evaluation  09/30/18    PT Start Time  7939    PT Stop Time  1443    PT Time Calculation (min)  41 min    Activity Tolerance  Patient tolerated treatment well    Behavior During Therapy  [facility] for tasks assessed/performed       Past Medical History:  Diagnosis Date  . Congenital musculoskeletal deformity of spine   . Hypertension   . Incontinence of bowel 09/2013  . Lumbago   . Lumbosacral spondylosis without myelopathy   . Sleep disorder    uses gabapentin at bedtime   . Spinal stenosis, lumbar region, without neurogenic claudication     Past Surgical History:  Procedure Laterality Date  . ANKLE SURGERY Right 2015   lengthened the achilles   . AUGMENTATION MAMMAPLASTY Bilateral 2000 or 2001 unsure    Left Implant has busted and is no longer visible  . BACK SURGERY  2015   fusion , Dr Patrice Paradise at [facility]; has 2 metal rods in place , fusion from t2 to s1   . BACK SURGERY  01/2017   Dr Rennis Harding ; repair of cracked titanium rod in back lumbar   . bunionectomy  Bilateral 1980s   multiple   . COLONOSCOPY     with polypectomy ; q 5 years   . FOOT SURGERY  07/2011  . SHOULDER SURGERY Left 2013   rotator cuff   . TOTAL HIP ARTHROPLASTY Right 05/08/2017   Procedure: RIGHT TOTAL HIP ARTHROPLASTY ANTERIOR APPROACH;  Surgeon: Paralee Cancel, MD;  Location: [facility];  Service: Orthopedics;  Laterality: Right;  70 mins    There were no vitals filed for this visit.  Subjective Assessment - 07/15/18 1433    Subjective  I felt very good when I left here last  session. It lasted for awhile. No pain right now. I am doing my exercises.    Currently in Pain?  No/denies                       [facility] Adult PT Treatment/Exercise - 07/15/18 0001      Lumbar Exercises: Supine   Bridge  --   2x 3 with max VC to contract gluteals     Knee/Hip Exercises: Aerobic   Recumbent Bike  L2 x 6 min, RPMS > 60      Knee/Hip Exercises: Standing   Hip Extension  AROM;Stengthening;Both;1 set;10 reps;Knee straight      Manual Therapy   Soft tissue mobilization  Lt hip flexor, quad group, lateral hip    Myofascial Release  Lt quad             PT Education - 07/15/18 1432    Education Details  HEP: supine bridge and standing hip extension    Person(s) Educated  Patient    Methods  Explanation;Demonstration;Tactile cues;Verbal cues    Comprehension  Returned demonstration;Verbalized understanding       PT Short Term Goals - 07/15/18 1417      PT SHORT TERM GOAL #1  Title  ind with initial HEP    Time  2    Period  Weeks    Status  Achieved        PT Long Term Goals - 07/08/18 1434      PT LONG TERM GOAL #1   Title  pt will be independent with HEP and consistently doing it on her own at least 5x/week    Time  12    Period  Weeks    Status  New    Target Date  09/30/18      PT LONG TERM GOAL #2   Title  Pt will be able to demonstrate hip extension ROM to neutral for improved gait and upright posture    Time  12    Period  Weeks    Status  New    Target Date  09/30/18      PT LONG TERM GOAL #3   Title  pt will report increased walking tolerance to >20minutes without increased pain    Time  12    Period  Weeks    Status  New    Target Date  09/30/18      PT LONG TERM GOAL #4   Title  pt will decrease FOTO to 47% or less    Baseline  56%    Time  12    Period  Weeks    Status  New    Target Date  09/30/18            Plan - 07/15/18 1405    Clinical Impression Statement  Pt reports the adjustments we made  to her stretches were helpful and she can do them now. Her pain is better, around 5%-8% in general. Soft tissues with improved suppleness that pt could get some better active hip extension. She will work on her bridges and standing hip extensions at home.    Personal Factors and Comorbidities  Comorbidity 2    Comorbidities  back surgery, THA, sleep dysfunction    Examination-Activity Limitations  Bed Mobility;Locomotion Level;Sleep    Stability/Clinical Decision Making  Evolving/Moderate complexity    Rehab Potential  Excellent    PT Frequency  3x / week    PT Duration  12 weeks    PT Treatment/Interventions  ADLs/Self Care Home Management;Biofeedback;Cryotherapy;Electrical Stimulation;Iontophoresis 4mg /ml Dexamethasone;Moist Heat;Gait training;Stair training;Functional mobility training;Therapeutic activities;Therapeutic exercise;Neuromuscular re-education;Patient/family education;Manual techniques;Dry needling;Passive range of motion;Taping;Scar mobilization    PT Next Visit Plan  Work on LT quads, hips. progress her core and hip strength.    PT Home Exercise Plan  Access Code: [email or web address]    Consulted and Agree with Plan of Care  Patient       Patient will benefit from skilled therapeutic intervention in order to improve the following deficits and impairments:  Abnormal gait, Difficulty walking, Impaired flexibility, Decreased strength, Decreased range of motion, Pain, Postural dysfunction  Visit Diagnosis: 1. Pain in left leg   2. Abnormal posture   3. Difficulty in walking, not elsewhere classified   4. Muscle weakness (generalized)        Problem List Patient Active Problem List   Diagnosis Date Noted  . Hip contracture, unspecified laterality 07/02/2018  . Overweight (BMI 25.0-29.9) 05/09/2017  . S/P right THA, AA 05/08/2017  . Chronic bilateral low back pain without sciatica 04/05/2015  . Disorder of sacroiliac joint 12/22/2014  . Chronic low back pain 12/22/2014  . Acquired  scoliosis 11/27/2011  . Lumbosacral spondylosis without myelopathy 05/22/2011  Enrique Weiss, PTA 07/15/2018, 2:45 PM  [facility] [address], [place], [place], [address] Phone: [phone]   Fax:  [phone]  Name: [name] MRN: [id number] Date of Birth: 09-Mar-1945  Access Code: [email or web address]  URL: [email or web address]  Date: 07/15/2018  Prepared by: [name]   Exercises  Hooklying Small March - 10 reps - 2 sets - 1x daily - 7x weekly  Supine Single Knee to Chest Stretch - 3 reps - 1 sets - 15 hold - 3x daily - 7x weekly  Hip Flexor Stretch with Chair - 3 reps - 1 sets - 15 hold - 2x daily - 7x weekly  Supine Bridge - 3 reps - 1 sets - 2 hold - 2x daily - 7x weekly  Standing Hip Extension - 10 reps - 1 sets - 2x daily - 7x weekly

## 2018-07-17 ENCOUNTER — Ambulatory Visit: Payer: Medicare Other | Admitting: Physical Therapy

## 2018-07-17 ENCOUNTER — Encounter: Payer: Self-pay | Admitting: Physical Therapy

## 2018-07-17 ENCOUNTER — Other Ambulatory Visit: Payer: Self-pay

## 2018-07-17 DIAGNOSIS — R293 Abnormal posture: Secondary | ICD-10-CM

## 2018-07-17 DIAGNOSIS — R262 Difficulty in walking, not elsewhere classified: Secondary | ICD-10-CM

## 2018-07-17 DIAGNOSIS — M79605 Pain in left leg: Secondary | ICD-10-CM | POA: Diagnosis not present

## 2018-07-17 DIAGNOSIS — M6281 Muscle weakness (generalized): Secondary | ICD-10-CM

## 2018-07-17 NOTE — Therapy (Signed)
[facility] [address] [place], [place], [address] Phone: [phone]   Fax:  [phone]  Physical Therapy Treatment  Patient Details  Name: [name] MRN: [id number] Date of Birth: December 18, 1945 Referring Provider (PT): Letta Pate Luanna Salk, MD   Encounter Date: 07/17/2018  PT End of Session - 07/17/18 1402    Visit Number  4    Date for PT Re-Evaluation  09/30/18    PT Start Time  1359    PT Stop Time  1442    PT Time Calculation (min)  43 min    Activity Tolerance  Patient tolerated treatment well    Behavior During Therapy  [facility] for tasks assessed/performed       Past Medical History:  Diagnosis Date  . Congenital musculoskeletal deformity of spine   . Hypertension   . Incontinence of bowel 09/2013  . Lumbago   . Lumbosacral spondylosis without myelopathy   . Sleep disorder    uses gabapentin at bedtime   . Spinal stenosis, lumbar region, without neurogenic claudication     Past Surgical History:  Procedure Laterality Date  . ANKLE SURGERY Right 2015   lengthened the achilles   . AUGMENTATION MAMMAPLASTY Bilateral 2000 or 2001 unsure    Left Implant has busted and is no longer visible  . BACK SURGERY  2015   fusion , Dr Patrice Paradise at [facility]; has 2 metal rods in place , fusion from t2 to s1   . BACK SURGERY  01/2017   Dr Rennis Harding ; repair of cracked titanium rod in back lumbar   . bunionectomy  Bilateral 1980s   multiple   . COLONOSCOPY     with polypectomy ; q 5 years   . FOOT SURGERY  07/2011  . SHOULDER SURGERY Left 2013   rotator cuff   . TOTAL HIP ARTHROPLASTY Right 05/08/2017   Procedure: RIGHT TOTAL HIP ARTHROPLASTY ANTERIOR APPROACH;  Surgeon: Paralee Cancel, MD;  Location: [facility];  Service: Orthopedics;  Laterality: Right;  70 mins    There were no vitals filed for this visit.  Subjective Assessment - 07/17/18 1404    Subjective  I am feeling better every day. My gym is  open and I plan on going to work on the stationary bike.    Currently in Pain?  No/denies    Multiple Pain Sites  No                       [facility] Adult PT Treatment/Exercise - 07/17/18 0001      Lumbar Exercises: Supine   Bridge  --   6x     Knee/Hip Exercises: Stretches   Hip Flexor Stretch  Both;2 reps;20 seconds    Hip Flexor Stretch Limitations  VC/TC for technique   Standing at step and in supine     Knee/Hip Exercises: Aerobic   Nustep  L2 x 10 min PTA present to discuss status      Knee/Hip Exercises: Standing   Hip Extension  AROM;Stengthening;Both;2 sets;10 reps;Knee straight      Manual Therapy   Soft tissue mobilization  Lt hip flexor, quad group, lateral hip    Myofascial Release  Lt quad               PT Short Term Goals - 07/15/18 1417      PT SHORT TERM GOAL #1   Title  ind with initial HEP  Time  2    Period  Weeks    Status  Achieved        PT Long Term Goals - 07/08/18 1434      PT LONG TERM GOAL #1   Title  pt will be independent with HEP and consistently doing it on her own at least 5x/week    Time  12    Period  Weeks    Status  New    Target Date  09/30/18      PT LONG TERM GOAL #2   Title  Pt will be able to demonstrate hip extension ROM to neutral for improved gait and upright posture    Time  12    Period  Weeks    Status  New    Target Date  09/30/18      PT LONG TERM GOAL #3   Title  pt will report increased walking tolerance to >46minutes without increased pain    Time  12    Period  Weeks    Status  New    Target Date  09/30/18      PT LONG TERM GOAL #4   Title  pt will decrease FOTO to 47% or less    Baseline  56%    Time  12    Period  Weeks    Status  New    Target Date  09/30/18            Plan - 07/17/18 1405    Clinical Impression Statement  Pt arrives today pain free with reports of  "every day is better and better." She feels the soft tissue work has really release her Lt hip  flexors and quads allowing her walking to improve. Her gym is now open so she plans to go and ride th estationary bike and work on her endurance. She currently can ride 10 minutes. She is independent in her new HEP with reports to concentrate on quality over quanity especially her bridges.    Personal Factors and Comorbidities  Comorbidity 2    Comorbidities  back surgery, THA, sleep dysfunction    Examination-Activity Limitations  Bed Mobility;Locomotion Level;Sleep    Stability/Clinical Decision Making  Evolving/Moderate complexity    Rehab Potential  Excellent    PT Frequency  3x / week    PT Duration  12 weeks    PT Treatment/Interventions  ADLs/Self Care Home Management;Biofeedback;Cryotherapy;Electrical Stimulation;Iontophoresis 4mg /ml Dexamethasone;Moist Heat;Gait training;Stair training;Functional mobility training;Therapeutic activities;Therapeutic exercise;Neuromuscular re-education;Patient/family education;Manual techniques;Dry needling;Passive range of motion;Taping;Scar mobilization    PT Next Visit Plan  Nustep or recumbent bike, work on LT quads, hips. progress her core and hip strength.    PT Home Exercise Plan  Access Code: [email or web address]    Consulted and Agree with Plan of Care  Patient       Patient will benefit from skilled therapeutic intervention in order to improve the following deficits and impairments:  Abnormal gait, Difficulty walking, Impaired flexibility, Decreased strength, Decreased range of motion, Pain, Postural dysfunction  Visit Diagnosis: 1. Pain in left leg   2. Abnormal posture   3. Difficulty in walking, not elsewhere classified   4. Muscle weakness (generalized)        Problem List Patient Active Problem List   Diagnosis Date Noted  . Hip contracture, unspecified laterality 07/02/2018  . Overweight (BMI 25.0-29.9) 05/09/2017  . S/P right THA, AA 05/08/2017  . Chronic bilateral low back pain without sciatica 04/05/2015  . Disorder of sacroiliac  joint  12/22/2014  . Chronic low back pain 12/22/2014  . Acquired scoliosis 11/27/2011  . Lumbosacral spondylosis without myelopathy 05/22/2011    Lanisa Ishler, PTA 07/17/2018, 2:45 PM  [facility] [address], [place], [place], [address] Phone: [phone]   Fax:  [phone]  Name: Britanee Vanblarcom MRN: [id number] Date of Birth: Aug 19, 1945

## 2018-07-19 ENCOUNTER — Other Ambulatory Visit: Payer: Self-pay

## 2018-07-19 ENCOUNTER — Encounter: Payer: Self-pay | Admitting: Physical Therapy

## 2018-07-19 ENCOUNTER — Ambulatory Visit: Payer: Medicare Other | Admitting: Physical Therapy

## 2018-07-19 DIAGNOSIS — R293 Abnormal posture: Secondary | ICD-10-CM

## 2018-07-19 DIAGNOSIS — M6281 Muscle weakness (generalized): Secondary | ICD-10-CM

## 2018-07-19 DIAGNOSIS — R262 Difficulty in walking, not elsewhere classified: Secondary | ICD-10-CM

## 2018-07-19 DIAGNOSIS — M79605 Pain in left leg: Secondary | ICD-10-CM

## 2018-07-19 NOTE — Therapy (Signed)
[facility] [address] [place], [place], [address] Phone: [phone]   Fax:  [phone]  Physical Therapy Treatment  Patient Details  Name: [name] MRN: [id number] Date of Birth: 03-Mar-1945 Referring Provider (PT): Letta Pate Luanna Salk, MD   Encounter Date: 07/19/2018  PT End of Session - 07/19/18 1426    Visit Number  5    Date for PT Re-Evaluation  09/30/18    PT Start Time  5885    PT Stop Time  1413    PT Time Calculation (min)  40 min    Activity Tolerance  Patient tolerated treatment well    Behavior During Therapy  [facility] for tasks assessed/performed       Past Medical History:  Diagnosis Date  . Congenital musculoskeletal deformity of spine   . Hypertension   . Incontinence of bowel 09/2013  . Lumbago   . Lumbosacral spondylosis without myelopathy   . Sleep disorder    uses gabapentin at bedtime   . Spinal stenosis, lumbar region, without neurogenic claudication     Past Surgical History:  Procedure Laterality Date  . ANKLE SURGERY Right 2015   lengthened the achilles   . AUGMENTATION MAMMAPLASTY Bilateral 2000 or 2001 unsure    Left Implant has busted and is no longer visible  . BACK SURGERY  2015   fusion , Dr Patrice Paradise at [facility]; has 2 metal rods in place , fusion from t2 to s1   . BACK SURGERY  01/2017   Dr Rennis Harding ; repair of cracked titanium rod in back lumbar   . bunionectomy  Bilateral 1980s   multiple   . COLONOSCOPY     with polypectomy ; q 5 years   . FOOT SURGERY  07/2011  . SHOULDER SURGERY Left 2013   rotator cuff   . TOTAL HIP ARTHROPLASTY Right 05/08/2017   Procedure: RIGHT TOTAL HIP ARTHROPLASTY ANTERIOR APPROACH;  Surgeon: Paralee Cancel, MD;  Location: [facility];  Service: Orthopedics;  Laterality: Right;  70 mins    There were no vitals filed for this visit.  Subjective Assessment - 07/19/18 1338    Subjective  I was able to pick up my dog this morning  and she is 13/14lbs.  I am sore today because I have company coming.    Currently in Pain?  Yes    Pain Score  1     Pain Location  Back    Pain Orientation  Right    Pain Descriptors / Indicators  Tiring    Pain Type  Chronic pain    Pain Onset  More than a month ago    Pain Frequency  Intermittent    Aggravating Factors   making the bed and cleaning    Multiple Pain Sites  No                       [facility] Adult PT Treatment/Exercise - 07/19/18 0001      Lumbar Exercises: Supine   Bridge  --   6x   Bridge Limitations  began to tilt hips to the side    Large Ball Abdominal Isometric Limitations  red ball press and overhead - 20x each    Other Supine Lumbar Exercises  clam with green band - 10x each side      Knee/Hip Exercises: Aerobic   Nustep  L3 x 10 min PT present to discuss status  Manual Therapy   Soft tissue mobilization  Lt hip flexor, quad group, lateral hip    Myofascial Release  Lt quad               PT Short Term Goals - 07/19/18 1343      PT SHORT TERM GOAL #1   Title  ind with initial HEP    Status  Achieved      PT SHORT TERM GOAL #2   Title  Pt will report 25% less hip pain at night    Baseline  40% less pain    Status  Achieved        PT Long Term Goals - 07/08/18 1434      PT LONG TERM GOAL #1   Title  pt will be independent with HEP and consistently doing it on her own at least 5x/week    Time  12    Period  Weeks    Status  New    Target Date  09/30/18      PT LONG TERM GOAL #2   Title  Pt will be able to demonstrate hip extension ROM to neutral for improved gait and upright posture    Time  12    Period  Weeks    Status  New    Target Date  09/30/18      PT LONG TERM GOAL #3   Title  pt will report increased walking tolerance to >59minutes without increased pain    Time  12    Period  Weeks    Status  New    Target Date  09/30/18      PT LONG TERM GOAL #4   Title  pt will decrease FOTO to 47% or less     Baseline  56%    Time  12    Period  Weeks    Status  New    Target Date  09/30/18            Plan - 07/19/18 1342    Clinical Impression Statement  Pt is feeling a little more pain today due to increased activity at home.  Pt did well with progression of core and glute strength and was able to add to her HEP. Pt will benefit from skilled PT to continue working towards improved gait.    PT Treatment/Interventions  ADLs/Self Care Home Management;Biofeedback;Cryotherapy;Electrical Stimulation;Iontophoresis 4mg /ml Dexamethasone;Moist Heat;Gait training;Stair training;Functional mobility training;Therapeutic activities;Therapeutic exercise;Neuromuscular re-education;Patient/family education;Manual techniques;Dry needling;Passive range of motion;Taping;Scar mobilization    PT Next Visit Plan  Nustep or recumbent bike, STM LT quads, hips. progress her core and hip strength    PT Home Exercise Plan  Access Code: [email or web address]    Consulted and Agree with Plan of Care  Patient       Patient will benefit from skilled therapeutic intervention in order to improve the following deficits and impairments:  Abnormal gait, Difficulty walking, Impaired flexibility, Decreased strength, Decreased range of motion, Pain, Postural dysfunction  Visit Diagnosis: 1. Pain in left leg   2. Abnormal posture   3. Difficulty in walking, not elsewhere classified   4. Muscle weakness (generalized)        Problem List Patient Active Problem List   Diagnosis Date Noted  . Hip contracture, unspecified laterality 07/02/2018  . Overweight (BMI 25.0-29.9) 05/09/2017  . S/P right THA, AA 05/08/2017  . Chronic bilateral low back pain without sciatica 04/05/2015  . Disorder of sacroiliac joint 12/22/2014  . Chronic low back  pain 12/22/2014  . Acquired scoliosis 11/27/2011  . Lumbosacral spondylosis without myelopathy 05/22/2011    Jule Ser, PT 07/19/2018, 2:27 PM  [facility] [address] [place], [place], [address] Phone: [phone]   Fax:  [phone]  Name: Arlys Scatena MRN: [id number] Date of Birth: [birth year]

## 2018-07-22 ENCOUNTER — Ambulatory Visit: Payer: Medicare Other | Admitting: Physical Therapy

## 2018-07-23 ENCOUNTER — Telehealth: Payer: Self-pay | Admitting: Physical Medicine & Rehabilitation

## 2018-07-23 NOTE — Telephone Encounter (Signed)
Pt called have an appt. 07/26/18--EMG/NVC--have question about instructions  about medications . Notified pt no needed to stop of any medications. Pt understood without questions.

## 2018-07-24 ENCOUNTER — Ambulatory Visit: Payer: Medicare Other | Admitting: Physical Therapy

## 2018-07-24 ENCOUNTER — Other Ambulatory Visit: Payer: Self-pay

## 2018-07-24 ENCOUNTER — Encounter: Payer: Self-pay | Admitting: Physical Therapy

## 2018-07-24 DIAGNOSIS — M79605 Pain in left leg: Secondary | ICD-10-CM

## 2018-07-24 DIAGNOSIS — M6281 Muscle weakness (generalized): Secondary | ICD-10-CM

## 2018-07-24 DIAGNOSIS — R262 Difficulty in walking, not elsewhere classified: Secondary | ICD-10-CM

## 2018-07-24 DIAGNOSIS — R293 Abnormal posture: Secondary | ICD-10-CM

## 2018-07-24 NOTE — Therapy (Signed)
[facility] [address], [place], [place], [address] Phone: [phone]   Fax:  [phone]  Physical Therapy Treatment  Patient Details  Name: [name] MRN: [id number] Date of Birth: [birth year] Referring Provider (PT): Letta Pate Luanna Salk, MD   Encounter Date: 07/24/2018  PT End of Session - 07/24/18 1355    Visit Number  6    Date for PT Re-Evaluation  09/30/18    PT Start Time  1324    PT Stop Time  1445    PT Time Calculation (min)  50 min    Activity Tolerance  Patient tolerated treatment well    Behavior During Therapy  [facility] for tasks assessed/performed       Past Medical History:  Diagnosis Date  . Congenital musculoskeletal deformity of spine   . Hypertension   . Incontinence of bowel 09/2013  . Lumbago   . Lumbosacral spondylosis without myelopathy   . Sleep disorder    uses gabapentin at bedtime   . Spinal stenosis, lumbar region, without neurogenic claudication     Past Surgical History:  Procedure Laterality Date  . ANKLE SURGERY Right 2015   lengthened the achilles   . AUGMENTATION MAMMAPLASTY Bilateral 2000 or 2001 unsure    Left Implant has busted and is no longer visible  . BACK SURGERY  2015   fusion , Dr Patrice Paradise at [facility]; has 2 metal rods in place , fusion from t2 to s1   . BACK SURGERY  01/2017   Dr Rennis Harding ; repair of cracked titanium rod in back lumbar   . bunionectomy  Bilateral 1980s   multiple   . COLONOSCOPY     with polypectomy ; q 5 years   . FOOT SURGERY  07/2011  . SHOULDER SURGERY Left 2013   rotator cuff   . TOTAL HIP ARTHROPLASTY Right 05/08/2017   Procedure: RIGHT TOTAL HIP ARTHROPLASTY ANTERIOR APPROACH;  Surgeon: Paralee Cancel, MD;  Location: [facility];  Service: Orthopedics;  Laterality: Right;  70 mins    There were no vitals filed for this visit.  Subjective Assessment - 07/24/18 1356    Subjective  My thigh issue is pretty much resolved. I  do notice some pain from posterior knee down back of calf, I plan to discuss this with Dr Aretta Nip.    Currently in Pain?  No/denies                       [facility] Adult PT Treatment/Exercise - 07/24/18 0001      Knee/Hip Exercises: Aerobic   Nustep  L3 x 10 min PT present to discuss status      Knee/Hip Exercises: Standing   Hip Abduction  --   Tried 10x, stopped d/t pt could not keep plane     Knee/Hip Exercises: Sidelying   Hip ABduction  Strengthening;Left;2 sets    Hip ABduction Limitations  6x 2 added to HEP      Manual Therapy   Soft tissue mobilization  Lt hip flexor, quad group, lateral hip    Myofascial Release  Lt quad               PT Short Term Goals - 07/19/18 1343      PT SHORT TERM GOAL #1   Title  ind with initial HEP    Status  Achieved      PT SHORT TERM GOAL #2   Title  Pt  will report 25% less hip pain at night    Baseline  40% less pain    Status  Achieved        PT Long Term Goals - 07/08/18 1434      PT LONG TERM GOAL #1   Title  pt will be independent with HEP and consistently doing it on her own at least 5x/week    Time  12    Period  Weeks    Status  New    Target Date  09/30/18      PT LONG TERM GOAL #2   Title  Pt will be able to demonstrate hip extension ROM to neutral for improved gait and upright posture    Time  12    Period  Weeks    Status  New    Target Date  09/30/18      PT LONG TERM GOAL #3   Title  pt will report increased walking tolerance to >35minutes without increased pain    Time  12    Period  Weeks    Status  New    Target Date  09/30/18      PT LONG TERM GOAL #4   Title  pt will decrease FOTO to 47% or less    Baseline  56%    Time  12    Period  Weeks    Status  New    Target Date  09/30/18            Plan - 07/24/18 1355    Clinical Impression Statement  PTA suggested to pt to break her exercises up throughout the day versus doing all of them at once. She agreed to try.  Standing abduction pt could not get into a good sagittal plane so we tried in sidelying with a shortened lever. This was more difficult but pt could stay in plane better. We added this her HEP along with lateral trunk stretching for her RT side. Soft tissues remarkable.Pt is interested in participating in aquatic therapy to work on her hip strengh and gait training .    Personal Factors and Comorbidities  Comorbidity 2    Comorbidities  back surgery, THA, sleep dysfunction    Examination-Activity Limitations  Bed Mobility;Locomotion Level;Sleep    Stability/Clinical Decision Making  Evolving/Moderate complexity    Rehab Potential  Excellent    PT Frequency  3x / week    PT Duration  12 weeks    PT Treatment/Interventions  ADLs/Self Care Home Management;Biofeedback;Cryotherapy;Electrical Stimulation;Iontophoresis 4mg /ml Dexamethasone;Moist Heat;Gait training;Stair training;Functional mobility training;Therapeutic activities;Therapeutic exercise;Neuromuscular re-education;Patient/family education;Manual techniques;Dry needling;Passive range of motion;Taping;Scar mobilization    PT Next Visit Plan  Send order to MD for aquatic therapy. Continue to work on LT glute medius strength and consider measuring hip extension for progress towards LTG.    PT Home Exercise Plan  Access Code: [email or web address]    Consulted and Agree with Plan of Care  Patient       Patient will benefit from skilled therapeutic intervention in order to improve the following deficits and impairments:  Abnormal gait, Difficulty walking, Impaired flexibility, Decreased strength, Decreased range of motion, Pain, Postural dysfunction  Visit Diagnosis: 1. Pain in left leg   2. Abnormal posture   3. Difficulty in walking, not elsewhere classified   4. Muscle weakness (generalized)        Problem List Patient Active Problem List   Diagnosis Date Noted  . Hip contracture, unspecified laterality 07/02/2018  . Overweight (  BMI 25.0-29.9)  05/09/2017  . S/P right THA, AA 05/08/2017  . Chronic bilateral low back pain without sciatica 04/05/2015  . Disorder of sacroiliac joint 12/22/2014  . Chronic low back pain 12/22/2014  . Acquired scoliosis 11/27/2011  . Lumbosacral spondylosis without myelopathy 05/22/2011    Joye Wesenberg, PTA 07/24/2018, 2:55 PM  [facility] [address] [place], [place], [address] Phone: [phone]   Fax:  [phone]  Name: [name] MRN: [id number] Date of Birth: 01-01-1946  Access Code: [email or web address]  URL: [email or web address]  Date: 07/24/2018  Prepared by: [name]   Exercises  Supine Single Knee to Chest Stretch - 3 reps - 1 sets - 15 hold - 3x daily - 7x weekly  Hip Flexor Stretch with Chair - 3 reps - 1 sets - 15 hold - 2x daily - 7x weekly  Supine Bridge - 3 reps - 1 sets - 2 hold - 2x daily - 7x weekly  Standing Hip Extension - 10 reps - 1 sets - 2x daily - 7x weekly  Hooklying Isometric Clamshell - 10 reps - 1 sets - 2x daily - 7x weekly  Seated Sidebending - 2 reps - 1 sets - 10 hold - 3x daily - 7x weekly  Sidelying Bent Knee Lift at 45 Degrees - 6 reps - 1 sets - 2x daily - 7x weekly

## 2018-07-25 ENCOUNTER — Ambulatory Visit: Payer: Medicare Other | Admitting: Physical Therapy

## 2018-07-25 ENCOUNTER — Other Ambulatory Visit: Payer: Self-pay

## 2018-07-25 ENCOUNTER — Encounter: Payer: Self-pay | Admitting: Physical Therapy

## 2018-07-25 DIAGNOSIS — M79605 Pain in left leg: Secondary | ICD-10-CM | POA: Diagnosis not present

## 2018-07-25 DIAGNOSIS — R262 Difficulty in walking, not elsewhere classified: Secondary | ICD-10-CM

## 2018-07-25 DIAGNOSIS — M6281 Muscle weakness (generalized): Secondary | ICD-10-CM

## 2018-07-25 DIAGNOSIS — R293 Abnormal posture: Secondary | ICD-10-CM

## 2018-07-25 NOTE — Addendum Note (Signed)
Addended by: Su Hoff on: 07/25/2018 07:08 PM   Modules accepted: Orders

## 2018-07-25 NOTE — Therapy (Signed)
[facility] [address] [place], [place], [address] Phone: [phone]   Fax:  [phone]  Physical Therapy Treatment  Patient Details  Name: [name] MRN: [id number] Date of Birth: 01-05-1946 Referring Provider (PT): Letta Pate Luanna Salk, MD   Encounter Date: 07/25/2018  PT End of Session - 07/25/18 1434    Visit Number  7    Date for PT Re-Evaluation  09/30/18    PT Start Time  9381    PT Stop Time  1520    PT Time Calculation (min)  46 min    Activity Tolerance  Patient tolerated treatment well    Behavior During Therapy  [facility] for tasks assessed/performed       Past Medical History:  Diagnosis Date  . Congenital musculoskeletal deformity of spine   . Hypertension   . Incontinence of bowel 09/2013  . Lumbago   . Lumbosacral spondylosis without myelopathy   . Sleep disorder    uses gabapentin at bedtime   . Spinal stenosis, lumbar region, without neurogenic claudication     Past Surgical History:  Procedure Laterality Date  . ANKLE SURGERY Right 2015   lengthened the achilles   . AUGMENTATION MAMMAPLASTY Bilateral 2000 or 2001 unsure    Left Implant has busted and is no longer visible  . BACK SURGERY  2015   fusion , Dr Patrice Paradise at [facility]; has 2 metal rods in place , fusion from t2 to s1   . BACK SURGERY  01/2017   Dr Rennis Harding ; repair of cracked titanium rod in back lumbar   . bunionectomy  Bilateral 1980s   multiple   . COLONOSCOPY     with polypectomy ; q 5 years   . FOOT SURGERY  07/2011  . SHOULDER SURGERY Left 2013   rotator cuff   . TOTAL HIP ARTHROPLASTY Right 05/08/2017   Procedure: RIGHT TOTAL HIP ARTHROPLASTY ANTERIOR APPROACH;  Surgeon: Paralee Cancel, MD;  Location: [facility];  Service: Orthopedics;  Laterality: Right;  70 mins    There were no vitals filed for this visit.  Subjective Assessment - 07/25/18 1527    Subjective  I am feeling sore from being here  yesterday.  I was also up for a long time cooking a huge meal.  Pt states she walked with the dogs for 20-25 minutes today.    Patient Stated Goals  be able to go for walks again    Currently in Pain?  No/denies         [facility] PT Assessment - 07/25/18 0001      PROM   Overall PROM Comments  Rt 4 deg limited; Lt 6 deg limited                    [facility] Adult PT Treatment/Exercise - 07/25/18 0001      Lumbar Exercises: Stretches   Hip Flexor Stretch  Right;Left;3 reps;20 seconds   standing - cues to position hip forward     Knee/Hip Exercises: Standing   Hip Extension  Stengthening;Right;Left;10 reps;Knee straight   leaning on mat table - cues to squeeze glute     Knee/Hip Exercises: Sidelying   Hip ABduction  Strengthening;Left;2 sets    Hip ABduction Limitations  6x 2 added to HEP   reviewed     Manual Therapy   Soft tissue mobilization  Lt hip flexor, quad group, lateral hip, ITB - using trigger point release and addaday  Myofascial Release  Lt quad               PT Short Term Goals - 07/19/18 1343      PT SHORT TERM GOAL #1   Title  ind with initial HEP    Status  Achieved      PT SHORT TERM GOAL #2   Title  Pt will report 25% less hip pain at night    Baseline  40% less pain    Status  Achieved        PT Long Term Goals - 07/25/18 1449      PT LONG TERM GOAL #1   Title  pt will be independent with HEP and consistently doing it on her own at least 5x/week    Status  On-going      PT LONG TERM GOAL #2   Title  Pt will be able to demonstrate hip extension ROM to neutral for improved gait and upright posture    Baseline  Lt 6 degrees; Rt 4 degrees      PT LONG TERM GOAL #3   Title  pt will report increased walking tolerance to >32minutes without increased pain    Baseline  able to walk 1/4 mile; 20 minutes with stopping d/t walking dogs    Status  On-going      PT LONG TERM GOAL #4   Title  pt will decrease FOTO to 47% or less     Status  On-going            Plan - 07/25/18 1457    Clinical Impression Statement  Pt demonstrates improved hip extension today.  She is planning on doing aquatic therapy which will help her get greater ROM and improved gait due to pressure on joints while in an upright position.  exercises done in upright position are limited in the gym due to postural deficits and weakness.  Pt will benefit from skilled PT to continue working on functional goals.    PT Treatment/Interventions  ADLs/Self Care Home Management;Biofeedback;Cryotherapy;Electrical Stimulation;Iontophoresis 4mg /ml Dexamethasone;Moist Heat;Gait training;Stair training;Functional mobility training;Therapeutic activities;Therapeutic exercise;Neuromuscular re-education;Patient/family education;Manual techniques;Dry needling;Passive range of motion;Taping;Scar mobilization;Aquatic Therapy    PT Next Visit Plan  continue LE glute strength, hip extension, core strength for improved upright posture, progress HEP as needed    PT Home Exercise Plan  Access Code: [email or web address]    Consulted and Agree with Plan of Care  Patient       Patient will benefit from skilled therapeutic intervention in order to improve the following deficits and impairments:  Abnormal gait, Difficulty walking, Impaired flexibility, Decreased strength, Decreased range of motion, Pain, Postural dysfunction  Visit Diagnosis: 1. Pain in left leg   2. Abnormal posture   3. Difficulty in walking, not elsewhere classified   4. Muscle weakness (generalized)        Problem List Patient Active Problem List   Diagnosis Date Noted  . Hip contracture, unspecified laterality 07/02/2018  . Overweight (BMI 25.0-29.9) 05/09/2017  . S/P right THA, AA 05/08/2017  . Chronic bilateral low back pain without sciatica 04/05/2015  . Disorder of sacroiliac joint 12/22/2014  . Chronic low back pain 12/22/2014  . Acquired scoliosis 11/27/2011  . Lumbosacral spondylosis without  myelopathy 05/22/2011    Jule Ser, PT 07/25/2018, 3:29 PM  [facility] [address], [place], [place], [address] Phone: [phone]   Fax:  [phone]  Name: [name] MRN: [id number] Date of  Birth: 1945/07/13

## 2018-07-25 NOTE — Therapy (Signed)
[facility] [address] [place], [place], [address] Phone: [phone]   Fax:  [phone]  Physical Therapy Treatment  Patient Details  Name: [name] MRN: [id number] Date of Birth: 23-Oct-1945 Referring Provider (PT): Letta Pate Luanna Salk, MD   Encounter Date: 07/25/2018  PT End of Session - 07/25/18 1434    Visit Number  7    Date for PT Re-Evaluation  09/30/18    PT Start Time  9702    PT Stop Time  1520    PT Time Calculation (min)  46 min    Activity Tolerance  Patient tolerated treatment well    Behavior During Therapy  [facility] for tasks assessed/performed       Past Medical History:  Diagnosis Date  . Congenital musculoskeletal deformity of spine   . Hypertension   . Incontinence of bowel 09/2013  . Lumbago   . Lumbosacral spondylosis without myelopathy   . Sleep disorder    uses gabapentin at bedtime   . Spinal stenosis, lumbar region, without neurogenic claudication     Past Surgical History:  Procedure Laterality Date  . ANKLE SURGERY Right 2015   lengthened the achilles   . AUGMENTATION MAMMAPLASTY Bilateral 2000 or 2001 unsure    Left Implant has busted and is no longer visible  . BACK SURGERY  2015   fusion , Dr Patrice Paradise at [facility]; has 2 metal rods in place , fusion from t2 to s1   . BACK SURGERY  01/2017   Dr Rennis Harding ; repair of cracked titanium rod in back lumbar   . bunionectomy  Bilateral 1980s   multiple   . COLONOSCOPY     with polypectomy ; q 5 years   . FOOT SURGERY  07/2011  . SHOULDER SURGERY Left 2013   rotator cuff   . TOTAL HIP ARTHROPLASTY Right 05/08/2017   Procedure: RIGHT TOTAL HIP ARTHROPLASTY ANTERIOR APPROACH;  Surgeon: Paralee Cancel, MD;  Location: [facility];  Service: Orthopedics;  Laterality: Right;  70 mins    There were no vitals filed for this visit.  Subjective Assessment - 07/25/18 1527    Subjective  I am feeling sore from being here  yesterday.  I was also up for a long time cooking a huge meal.  Pt states she walked with the dogs for 20-25 minutes today.    Patient Stated Goals  be able to go for walks again    Currently in Pain?  No/denies         [facility] PT Assessment - 07/25/18 0001      Assessment   Medical Diagnosis  M24.559 (ICD-10-CM) - Hip flexor tightness, unspecified laterality    Referring Provider (PT)  Kirsteins, Luanna Salk, MD      PROM   Overall PROM Comments  Rt 4 deg limited; Lt 6 deg limited                    [facility] Adult PT Treatment/Exercise - 07/25/18 0001      Lumbar Exercises: Stretches   Hip Flexor Stretch  Right;Left;3 reps;20 seconds   standing - cues to position hip forward     Knee/Hip Exercises: Standing   Hip Extension  Stengthening;Right;Left;10 reps;Knee straight   leaning on mat table - cues to squeeze glute     Knee/Hip Exercises: Sidelying   Hip ABduction  Strengthening;Left;2 sets    Hip ABduction Limitations  6x 2 added to HEP  reviewed     Manual Therapy   Soft tissue mobilization  Lt hip flexor, quad group, lateral hip, ITB - using trigger point release and addaday    Myofascial Release  Lt quad               PT Short Term Goals - 07/19/18 1343      PT SHORT TERM GOAL #1   Title  ind with initial HEP    Status  Achieved      PT SHORT TERM GOAL #2   Title  Pt will report 25% less hip pain at night    Baseline  40% less pain    Status  Achieved        PT Long Term Goals - 07/25/18 1449      PT LONG TERM GOAL #1   Title  pt will be independent with HEP and consistently doing it on her own at least 5x/week    Status  On-going      PT LONG TERM GOAL #2   Title  Pt will be able to demonstrate hip extension ROM to neutral for improved gait and upright posture    Baseline  Lt 6 degrees; Rt 4 degrees      PT LONG TERM GOAL #3   Title  pt will report increased walking tolerance to >47minutes without increased pain    Baseline  able to  walk 1/4 mile; 20 minutes with stopping d/t walking dogs    Status  On-going      PT LONG TERM GOAL #4   Title  pt will decrease FOTO to 47% or less    Status  On-going            Plan - 07/25/18 1457    Clinical Impression Statement  Pt demonstrates improved hip extension today.  She is planning on doing aquatic therapy which will help her get greater ROM and improved gait due to pressure on joints while in an upright position.  exercises done in upright position are limited in the gym due to postural deficits and weakness.  Pt will benefit from skilled PT to continue working on functional goals.    PT Treatment/Interventions  ADLs/Self Care Home Management;Biofeedback;Cryotherapy;Electrical Stimulation;Iontophoresis 4mg /ml Dexamethasone;Moist Heat;Gait training;Stair training;Functional mobility training;Therapeutic activities;Therapeutic exercise;Neuromuscular re-education;Patient/family education;Manual techniques;Dry needling;Passive range of motion;Taping;Scar mobilization;Aquatic Therapy    PT Next Visit Plan  continue LE glute strength, hip extension, core strength for improved upright posture, progress HEP as needed    PT Home Exercise Plan  Access Code: [email or web address]    Consulted and Agree with Plan of Care  Patient       Patient will benefit from skilled therapeutic intervention in order to improve the following deficits and impairments:  Abnormal gait, Difficulty walking, Impaired flexibility, Decreased strength, Decreased range of motion, Pain, Postural dysfunction  Visit Diagnosis: 1. Pain in left leg   2. Abnormal posture   3. Difficulty in walking, not elsewhere classified   4. Muscle weakness (generalized)        Problem List Patient Active Problem List   Diagnosis Date Noted  . Hip contracture, unspecified laterality 07/02/2018  . Overweight (BMI 25.0-29.9) 05/09/2017  . S/P right THA, AA 05/08/2017  . Chronic bilateral low back pain without sciatica 04/05/2015   . Disorder of sacroiliac joint 12/22/2014  . Chronic low back pain 12/22/2014  . Acquired scoliosis 11/27/2011  . Lumbosacral spondylosis without myelopathy 05/22/2011    Camillo Flaming Garrie Elenes, PT 07/25/2018, 7:06 PM  [facility] [address], [place], [place], [address] Phone: [phone]   Fax:  [phone]  Name: Delisha Peaden MRN: [id number] Date of Birth: Jan 21, 1945

## 2018-07-26 ENCOUNTER — Other Ambulatory Visit: Payer: Self-pay

## 2018-07-26 ENCOUNTER — Encounter: Payer: Medicare Other | Attending: Physical Medicine & Rehabilitation | Admitting: Physical Medicine & Rehabilitation

## 2018-07-26 ENCOUNTER — Encounter: Payer: Self-pay | Admitting: Physical Medicine & Rehabilitation

## 2018-07-26 VITALS — BP 148/105 | HR 78 | Temp 97.7°F | Ht 65.0 in | Wt 199.0 lb

## 2018-07-26 DIAGNOSIS — M961 Postlaminectomy syndrome, not elsewhere classified: Secondary | ICD-10-CM | POA: Insufficient documentation

## 2018-07-26 DIAGNOSIS — R29898 Other symptoms and signs involving the musculoskeletal system: Secondary | ICD-10-CM

## 2018-07-26 DIAGNOSIS — M24559 Contracture, unspecified hip: Secondary | ICD-10-CM | POA: Diagnosis present

## 2018-07-26 NOTE — Patient Instructions (Signed)
Left leg has no evidence of neuropathy or radiculopathy.

## 2018-07-29 ENCOUNTER — Other Ambulatory Visit: Payer: Self-pay

## 2018-07-29 ENCOUNTER — Encounter: Payer: Self-pay | Admitting: Physical Therapy

## 2018-07-29 ENCOUNTER — Ambulatory Visit: Payer: Medicare Other | Admitting: Physical Therapy

## 2018-07-29 DIAGNOSIS — M79605 Pain in left leg: Secondary | ICD-10-CM

## 2018-07-29 DIAGNOSIS — R293 Abnormal posture: Secondary | ICD-10-CM

## 2018-07-29 DIAGNOSIS — M6281 Muscle weakness (generalized): Secondary | ICD-10-CM

## 2018-07-29 DIAGNOSIS — R262 Difficulty in walking, not elsewhere classified: Secondary | ICD-10-CM

## 2018-07-29 NOTE — Therapy (Signed)
[facility] [address], [place], [place], [address] Phone: [phone]   Fax:  [phone]  Physical Therapy Treatment  Patient Details  Name: [name] MRN: [id number] Date of Birth: December 26, 1945 Referring Provider (PT): Letta Pate Luanna Salk, MD   Encounter Date: 07/29/2018  PT End of Session - 07/29/18 1405    Visit Number  8    Date for PT Re-Evaluation  09/30/18    PT Start Time  0867    PT Stop Time  1442    PT Time Calculation (min)  47 min    Activity Tolerance  Patient tolerated treatment well    Behavior During Therapy  [facility] for tasks assessed/performed       Past Medical History:  Diagnosis Date  . Congenital musculoskeletal deformity of spine   . Hypertension   . Incontinence of bowel 09/2013  . Lumbago   . Lumbosacral spondylosis without myelopathy   . Sleep disorder    uses gabapentin at bedtime   . Spinal stenosis, lumbar region, without neurogenic claudication     Past Surgical History:  Procedure Laterality Date  . ANKLE SURGERY Right 2015   lengthened the achilles   . AUGMENTATION MAMMAPLASTY Bilateral 2000 or 2001 unsure    Left Implant has busted and is no longer visible  . BACK SURGERY  2015   fusion , Dr Patrice Paradise at [facility]; has 2 metal rods in place , fusion from t2 to s1   . BACK SURGERY  01/2017   Dr Rennis Harding ; repair of cracked titanium rod in back lumbar   . bunionectomy  Bilateral 1980s   multiple   . COLONOSCOPY     with polypectomy ; q 5 years   . FOOT SURGERY  07/2011  . SHOULDER SURGERY Left 2013   rotator cuff   . TOTAL HIP ARTHROPLASTY Right 05/08/2017   Procedure: RIGHT TOTAL HIP ARTHROPLASTY ANTERIOR APPROACH;  Surgeon: Paralee Cancel, MD;  Location: [facility];  Service: Orthopedics;  Laterality: Right;  70 mins    There were no vitals filed for this visit.  Subjective Assessment - 07/29/18 1407    Subjective  I am exhausted from doing home projects.  EMG normal.    Currently in Pain?  No/denies    Multiple Pain Sites  No                       [facility] Adult PT Treatment/Exercise - 07/29/18 0001      Lumbar Exercises: Stretches   Lower Trunk Rotation  3 reps;20 seconds    Other Lumbar Stretch Exercise  knee to opposite shoulder 2 x 20sec      Lumbar Exercises: Supine   Bent Knee Raise  10 reps    Bridge  --   8x     Knee/Hip Exercises: Aerobic   Nustep  L3 x 10 min PTA present to discuss status      Knee/Hip Exercises: Supine   Other Supine Knee/Hip Exercises  green band clamshells 10x      Knee/Hip Exercises: Sidelying   Hip ABduction  Strengthening;Left    Hip ABduction Limitations  8x2       Manual Therapy   Soft tissue mobilization  Lt hip flexor, quad group, lateral hip, ITB - using trigger point release and addaday    Myofascial Release  Lt quad               PT  Short Term Goals - 07/19/18 1343      PT SHORT TERM GOAL #1   Title  ind with initial HEP    Status  Achieved      PT SHORT TERM GOAL #2   Title  Pt will report 25% less hip pain at night    Baseline  40% less pain    Status  Achieved        PT Long Term Goals - 07/25/18 1449      PT LONG TERM GOAL #1   Title  pt will be independent with HEP and consistently doing it on her own at least 5x/week    Status  On-going      PT LONG TERM GOAL #2   Title  Pt will be able to demonstrate hip extension ROM to neutral for improved gait and upright posture    Baseline  Lt 6 degrees; Rt 4 degrees      PT LONG TERM GOAL #3   Title  pt will report increased walking tolerance to >17minutes without increased pain    Baseline  able to walk 1/4 mile; 20 minutes with stopping d/t walking dogs    Status  On-going      PT LONG TERM GOAL #4   Title  pt will decrease FOTO to 47% or less    Status  On-going            Plan - 07/29/18 1406    Clinical Impression Statement  Pt presents fatigued from all the physical work she has  undertaken at her home as they are getting ready to sell their house. She had a negative EMG last week. Session focused a lot of gluteal & core strength. Pt showing improved strength in her gluteals.    Personal Factors and Comorbidities  Comorbidity 2    Comorbidities  back surgery, THA, sleep dysfunction    Examination-Activity Limitations  Bed Mobility;Locomotion Level;Sleep    Stability/Clinical Decision Making  Evolving/Moderate complexity    Rehab Potential  Excellent    PT Frequency  3x / week    PT Duration  12 weeks    PT Treatment/Interventions  ADLs/Self Care Home Management;Biofeedback;Cryotherapy;Electrical Stimulation;Iontophoresis 4mg /ml Dexamethasone;Moist Heat;Gait training;Stair training;Functional mobility training;Therapeutic activities;Therapeutic exercise;Neuromuscular re-education;Patient/family education;Manual techniques;Dry needling;Passive range of motion;Taping;Scar mobilization;Aquatic Therapy    PT Next Visit Plan  continue LE glute strength, hip extension, core strength for improved upright posture, progress HEP as needed    PT Home Exercise Plan  Access Code: [email or web address]    Consulted and Agree with Plan of Care  Patient       Patient will benefit from skilled therapeutic intervention in order to improve the following deficits and impairments:  Abnormal gait, Difficulty walking, Impaired flexibility, Decreased strength, Decreased range of motion, Pain, Postural dysfunction  Visit Diagnosis: 1. Pain in left leg   2. Abnormal posture   3. Difficulty in walking, not elsewhere classified   4. Muscle weakness (generalized)        Problem List Patient Active Problem List   Diagnosis Date Noted  . Hip contracture, unspecified laterality 07/02/2018  . Overweight (BMI 25.0-29.9) 05/09/2017  . S/P right THA, AA 05/08/2017  . Chronic bilateral low back pain without sciatica 04/05/2015  . Disorder of sacroiliac joint 12/22/2014  . Chronic low back pain 12/22/2014   . Acquired scoliosis 11/27/2011  . Lumbosacral spondylosis without myelopathy 05/22/2011    Emanuell Morina, PTA 07/29/2018, 2:45 PM  [facility] [address]  [address] [place], [place], [address] Phone: [phone]   Fax:  [phone]  Name: Kaianna Dolezal MRN: [id number] Date of Birth: 09-18-1945

## 2018-07-31 ENCOUNTER — Ambulatory Visit: Payer: Medicare Other | Admitting: Physical Therapy

## 2018-07-31 ENCOUNTER — Encounter: Payer: Self-pay | Admitting: Physical Therapy

## 2018-07-31 ENCOUNTER — Other Ambulatory Visit: Payer: Self-pay

## 2018-07-31 DIAGNOSIS — M6281 Muscle weakness (generalized): Secondary | ICD-10-CM

## 2018-07-31 DIAGNOSIS — M79605 Pain in left leg: Secondary | ICD-10-CM | POA: Diagnosis not present

## 2018-07-31 DIAGNOSIS — R293 Abnormal posture: Secondary | ICD-10-CM

## 2018-07-31 DIAGNOSIS — R262 Difficulty in walking, not elsewhere classified: Secondary | ICD-10-CM

## 2018-07-31 NOTE — Therapy (Signed)
[facility] [address], [place], [place], [address] Phone: [phone]   Fax:  [phone]  Physical Therapy Treatment  Patient Details  Name: [name] MRN: [id number] Date of Birth: 11/14/1945 Referring Provider (PT): Letta Pate Luanna Salk, MD   Encounter Date: 07/31/2018  PT End of Session - 07/31/18 1404    Visit Number  9    Date for PT Re-Evaluation  09/30/18    PT Start Time  1355    PT Stop Time  1450    PT Time Calculation (min)  55 min    Activity Tolerance  Patient tolerated treatment well    Behavior During Therapy  [facility] for tasks assessed/performed       Past Medical History:  Diagnosis Date  . Congenital musculoskeletal deformity of spine   . Hypertension   . Incontinence of bowel 09/2013  . Lumbago   . Lumbosacral spondylosis without myelopathy   . Sleep disorder    uses gabapentin at bedtime   . Spinal stenosis, lumbar region, without neurogenic claudication     Past Surgical History:  Procedure Laterality Date  . ANKLE SURGERY Right 2015   lengthened the achilles   . AUGMENTATION MAMMAPLASTY Bilateral 2000 or 2001 unsure    Left Implant has busted and is no longer visible  . BACK SURGERY  2015   fusion , Dr Patrice Paradise at [facility]; has 2 metal rods in place , fusion from t2 to s1   . BACK SURGERY  01/2017   Dr Rennis Harding ; repair of cracked titanium rod in back lumbar   . bunionectomy  Bilateral 1980s   multiple   . COLONOSCOPY     with polypectomy ; q 5 years   . FOOT SURGERY  07/2011  . SHOULDER SURGERY Left 2013   rotator cuff   . TOTAL HIP ARTHROPLASTY Right 05/08/2017   Procedure: RIGHT TOTAL HIP ARTHROPLASTY ANTERIOR APPROACH;  Surgeon: Paralee Cancel, MD;  Location: [facility];  Service: Orthopedics;  Laterality: Right;  70 mins    There were no vitals filed for this visit.  Subjective Assessment - 07/31/18 1406    Subjective  I am tired and frustrated, when I am tired  my posture isn't good. Pt walked for half a mile this AM and fatigued. I want to be all better. Still pain/nerve pain is abolished in her LT anterior hip/thigh.    Limitations  Walking    How long can you walk comfortably?  1/4 mile    Diagnostic tests  scheduled for nerve conduction test    Currently in Pain?  No/denies   Not currently, just fatigue.   Multiple Pain Sites  No                       [facility] Adult PT Treatment/Exercise - 07/31/18 0001      Lumbar Exercises: Seated   Other Seated Lumbar Exercises  Red band horizontal abduction 10x, diagonals 10x bil small folded towel under LT hip to level out pelvis   Did in supine vs sitting easier on LT hip/trunk     Lumbar Exercises: Supine   Clam  15 reps    Clam Limitations  red band    Bridge  10 reps;3 seconds      Knee/Hip Exercises: Aerobic   Nustep  L3 x 10 min PTA present to discuss status      Knee/Hip Exercises: Sidelying   Hip ABduction  Strengthening;Left;2 sets;10 reps      Manual Therapy   Soft tissue mobilization  Lt hip flexor, quad group, lateral hip, ITB - using trigger point release and addaday    Myofascial Release  Lt quad             PT Education - 07/31/18 1423    Education Details  Red band supine scapular unattached ex for HEP progression    Person(s) Educated  Patient    Methods  Explanation;Demonstration;Tactile cues;Verbal cues;Handout    Comprehension  Returned demonstration;Verbalized understanding       PT Short Term Goals - 07/19/18 1343      PT SHORT TERM GOAL #1   Title  ind with initial HEP    Status  Achieved      PT SHORT TERM GOAL #2   Title  Pt will report 25% less hip pain at night    Baseline  40% less pain    Status  Achieved        PT Long Term Goals - 07/31/18 1455      PT LONG TERM GOAL #3   Title  pt will report increased walking tolerance to >66minutes without increased pain    Time  12    Period  Weeks    Status  On-going   Pt walking 1/2  mile now, it is fatiguing but no pain.           Plan - 07/31/18 1404    Clinical Impression Statement  Pt presents today super fatigued from her days work ( [employer]) and she did mention not sleeping last night due to her dogs.She is frustrated at how hard it is to maintain her posture especially when she is fatigued. PTA affirmed her frustration and discussed all the ways pt has improved since her evaluation. Pt is now able to walk 1/2 mile with her cane ( which frustrates her having to use a cane). Prior distance was 1/4 mile. Walking does fatigue her, mainly her LT side. She was encouraged to keep up her walking and to use the cane for support. Her hip strength has improved greatly as she can now complete 10 reps of bridging and S/L hip abduction ( modifed) without complete muscle fatigue. The Lt anterior thigh pain has abolished 100%.    Personal Factors and Comorbidities  Comorbidity 2    Comorbidities  back surgery, THA, sleep dysfunction    Examination-Activity Limitations  Bed Mobility;Locomotion Level;Sleep    Stability/Clinical Decision Making  Evolving/Moderate complexity    Rehab Potential  Excellent    PT Frequency  3x / week    PT Duration  12 weeks    PT Treatment/Interventions  ADLs/Self Care Home Management;Biofeedback;Cryotherapy;Electrical Stimulation;Iontophoresis 4mg /ml Dexamethasone;Moist Heat;Gait training;Stair training;Functional mobility training;Therapeutic activities;Therapeutic exercise;Neuromuscular re-education;Patient/family education;Manual techniques;Dry needling;Passive range of motion;Taping;Scar mobilization;Aquatic Therapy    PT Next Visit Plan  10th visit protocol; FOTO, ROM of LT hip, MMT LT hip/LE, begin strength for LT trunk if time available    PT Home Exercise Plan  Access Code: [email or web address]    Consulted and Agree with Plan of Care  Patient       Patient will benefit from skilled therapeutic intervention in order to improve the following deficits and  impairments:  Abnormal gait, Difficulty walking, Impaired flexibility, Decreased strength, Decreased range of motion, Pain, Postural dysfunction  Visit Diagnosis: 1. Pain in left leg   2. Abnormal posture   3. Difficulty in walking, not elsewhere classified   4.  Muscle weakness (generalized)        Problem List Patient Active Problem List   Diagnosis Date Noted  . Hip contracture, unspecified laterality 07/02/2018  . Overweight (BMI 25.0-29.9) 05/09/2017  . S/P right THA, AA 05/08/2017  . Chronic bilateral low back pain without sciatica 04/05/2015  . Disorder of sacroiliac joint 12/22/2014  . Chronic low back pain 12/22/2014  . Acquired scoliosis 11/27/2011  . Lumbosacral spondylosis without myelopathy 05/22/2011    [name], PTA 07/31/2018, 4:51 PM  [facility] [address] [place], [place], [address] Phone: [phone]   Fax:  [phone]  Name: [name] MRN: [id number] Date of Birth: 1945-08-03  Access Code: [email or web address]  URL: [email or web address]  Date: 07/31/2018  Prepared by: [name]   Exercises  Supine Single Knee to Chest Stretch - 3 reps - 1 sets - 15 hold - 3x daily - 7x weekly  Hip Flexor Stretch with Chair - 3 reps - 1 sets - 15 hold - 2x daily - 7x weekly  Supine Bridge - 3 reps - 1 sets - 2 hold - 2x daily - 7x weekly  Standing Hip Extension - 10 reps - 1 sets - 2x daily - 7x weekly  Hooklying Isometric Clamshell - 10 reps - 1 sets - 2x daily - 7x weekly  Seated Sidebending - 2 reps - 1 sets - 10 hold - 3x daily - 7x weekly  Sidelying Bent Knee Lift at 45 Degrees - 6 reps - 1 sets - 2x daily - 7x weekly  Supine Shoulder Horizontal Abduction with Resistance - 10 reps - 2 sets - 1x daily - 7x weekly  Seated Shoulder Diagonal with Resistance - 10 reps - 2 sets - 1x daily - 7x weekly  Supine Piriformis Stretch with Foot on Ground - 3 reps - 1 sets - 30 hold -  2x daily - 7x weekly

## 2018-08-02 ENCOUNTER — Ambulatory Visit: Payer: Medicare Other | Admitting: Physical Therapy

## 2018-08-02 ENCOUNTER — Encounter: Payer: Self-pay | Admitting: Physical Therapy

## 2018-08-02 DIAGNOSIS — M6281 Muscle weakness (generalized): Secondary | ICD-10-CM

## 2018-08-02 DIAGNOSIS — M79605 Pain in left leg: Secondary | ICD-10-CM | POA: Diagnosis not present

## 2018-08-02 DIAGNOSIS — R262 Difficulty in walking, not elsewhere classified: Secondary | ICD-10-CM

## 2018-08-02 DIAGNOSIS — R293 Abnormal posture: Secondary | ICD-10-CM

## 2018-08-02 NOTE — Patient Instructions (Signed)
Added BUE extension with blue TB 2x10 reps a day   [facility] [address] [place], [place] [address] Phone # [phone] Fax [phone]

## 2018-08-02 NOTE — Therapy (Signed)
[facility] [address], [place], [place], [address] Phone: [phone]   Fax:  [phone]  Physical Therapy Treatment  Patient Details  Name: Naavya Postma MRN: [id number] Date of Birth: [birth year] Referring Provider (PT): Letta Pate Luanna Salk, MD    Progress Note Reporting Period 07/08/18 to 08/02/18  See note below for Objective Data and Assessment of Progress/Goals.      Encounter Date: 08/02/2018  PT End of Session - 08/02/18 1433    Visit Number  10    Date for PT Re-Evaluation  09/30/18    PT Start Time  0623   pt arrived before her appointment   PT Stop Time  1433    PT Time Calculation (min)  35 min    Activity Tolerance  Patient tolerated treatment well;No increased pain    Behavior During Therapy  [facility] for tasks assessed/performed       Past Medical History:  Diagnosis Date  . Congenital musculoskeletal deformity of spine   . Hypertension   . Incontinence of bowel 09/2013  . Lumbago   . Lumbosacral spondylosis without myelopathy   . Sleep disorder    uses gabapentin at bedtime   . Spinal stenosis, lumbar region, without neurogenic claudication     Past Surgical History:  Procedure Laterality Date  . ANKLE SURGERY Right 2015   lengthened the achilles   . AUGMENTATION MAMMAPLASTY Bilateral 2000 or 2001 unsure    Left Implant has busted and is no longer visible  . BACK SURGERY  2015   fusion , Dr Patrice Paradise at [facility]; has 2 metal rods in place , fusion from t2 to s1   . BACK SURGERY  01/2017   Dr Rennis Harding ; repair of cracked titanium rod in back lumbar   . bunionectomy  Bilateral 1980s   multiple   . COLONOSCOPY     with polypectomy ; q 5 years   . FOOT SURGERY  07/2011  . SHOULDER SURGERY Left 2013   rotator cuff   . TOTAL HIP ARTHROPLASTY Right 05/08/2017   Procedure: RIGHT TOTAL HIP ARTHROPLASTY ANTERIOR APPROACH;  Surgeon: Paralee Cancel, MD;  Location: [facility];  Service:  Orthopedics;  Laterality: Right;  70 mins    There were no vitals filed for this visit.  Subjective Assessment - 08/02/18 1359    Subjective  Pt states that she is frustrated about her trendelenburg gait. She is working on her exercises at home. No pain currently. She feels that her Lt hip is getting stronger.    Limitations  Walking    How long can you walk comfortably?  1/4 mile    Diagnostic tests  scheduled for nerve conduction test    Currently in Pain?  No/denies         [facility] PT Assessment - 08/02/18 0001      Assessment   Medical Diagnosis  M24.559 (ICD-10-CM) - Hip flexor tightness, unspecified laterality    Referring Provider (PT)  Kirsteins, Luanna Salk, MD      Observation/Other Assessments   Focus on Therapeutic Outcomes (FOTO)   38% limited       Strength   Right Hip Flexion  5/5    Right Hip External Rotation   4/5    Right Hip ABduction  4+/5    Left Hip Flexion  5/5    Left Hip External Rotation  4/5    Left Hip ABduction  4-/5  [facility] Adult PT Treatment/Exercise - 08/02/18 0001      Lumbar Exercises: Standing   Other Standing Lumbar Exercises  BUE pressdown with blue TB x15 reps, minor cues to tuck pelvis underneath trunk       Knee/Hip Exercises: Stretches   Other Knee/Hip Stretches  standing hip adductor stretch 3x20 sec on Lt      Knee/Hip Exercises: Standing   Hip Abduction  Both;Stengthening;1 set;10 reps    Abduction Limitations  yellow TB around feet     Other Standing Knee Exercises  Rt hip hike x10 reps BUE suport              PT Education - 08/02/18 1437    Education Details  improvements in objective measurements and FOTO; reviewed HEP questions    Person(s) Educated  Patient    Methods  Explanation;Verbal cues    Comprehension  Verbalized understanding;Returned demonstration       PT Short Term Goals - 08/02/18 1455      PT SHORT TERM GOAL #1   Title  ind with initial HEP    Status  Achieved       PT SHORT TERM GOAL #2   Title  Pt will report 25% less hip pain at night    Baseline  40% less pain    Status  Achieved        PT Long Term Goals - 08/02/18 1456      PT LONG TERM GOAL #1   Title  pt will be independent with HEP and consistently doing it on her own at least 5x/week    Status  On-going      PT LONG TERM GOAL #2   Title  Pt will be able to demonstrate hip extension ROM to neutral for improved gait and upright posture    Baseline  Lt 6 degrees; Rt 4 degrees    Status  On-going      PT LONG TERM GOAL #3   Title  pt will report increased walking tolerance to >39mnutes without increased pain    Baseline  able to walk 1/2 mile    Status  Partially Met      PT LONG TERM GOAL #4   Title  pt will decrease FOTO to 47% or less    Baseline  38% limited    Status  On-going            Plan - 08/02/18 1526    Clinical Impression Statement  Pt is making steady progress towards her goals. Her FOTO has improved from 59% limitation down to 38% limitation. She feels that her strength is improving and this was evident by her muscle grade increase with MMT. Pt is now walking .5 mile compared to .234me without difficulty. She does still have limitations in active and passive hip abduction and extension, as well as noted trunk weakness requiring consistent verbal cuing for posture correction during her session. Pt would continue to benefit from skilled PT to address her limitations in flexibility, strength and endurance to promote a healthy lifestyle and increase her quality of life.    Personal Factors and Comorbidities  Comorbidity 2    Comorbidities  back surgery, THA, sleep dysfunction    Examination-Activity Limitations  Bed Mobility;Locomotion Level;Sleep    Stability/Clinical Decision Making  Evolving/Moderate complexity    Rehab Potential  Excellent    PT Frequency  3x / week    PT Duration  12 weeks    PT Treatment/Interventions  ADLs/Self Care Home  Management;Biofeedback;Cryotherapy;Electrical Stimulation;Iontophoresis 104m/ml Dexamethasone;Moist Heat;Gait training;Stair training;Functional mobility training;Therapeutic activities;Therapeutic exercise;Neuromuscular re-education;Patient/family education;Manual techniques;Dry needling;Passive range of motion;Taping;Scar mobilization;Aquatic Therapy    PT Next Visit Plan  trunk strength, pallof press for antirotation; hip abductor and extensor strength progression    PT Home Exercise Plan  Access Code: [email or web address]   Consulted and Agree with Plan of Care  Patient       Patient will benefit from skilled therapeutic intervention in order to improve the following deficits and impairments:  Abnormal gait, Difficulty walking, Impaired flexibility, Decreased strength, Decreased range of motion, Pain, Postural dysfunction  Visit Diagnosis: 1. Pain in left leg   2. Abnormal posture   3. Difficulty in walking, not elsewhere classified   4. Muscle weakness (generalized)        Problem List Patient Active Problem List   Diagnosis Date Noted  . Hip contracture, unspecified laterality 07/02/2018  . Overweight (BMI 25.0-29.9) 05/09/2017  . S/P right THA, AA 05/08/2017  . Chronic bilateral low back pain without sciatica 04/05/2015  . Disorder of sacroiliac joint 12/22/2014  . Chronic low back pain 12/22/2014  . Acquired scoliosis 11/27/2011  . Lumbosacral spondylosis without myelopathy 05/22/2011    3:31 PM,08/02/18 SSherol DadePT, DPT [facility] [address] [place] [address]Phone: [phone]  Fax:  [phone] Name: HJameshia HayashidaMRN: [id number] of Birth: 606-Jun-1947

## 2018-08-05 ENCOUNTER — Ambulatory Visit: Payer: Medicare Other | Admitting: Physical Therapy

## 2018-08-05 ENCOUNTER — Other Ambulatory Visit: Payer: Self-pay

## 2018-08-05 ENCOUNTER — Encounter: Payer: Self-pay | Admitting: Physical Therapy

## 2018-08-05 DIAGNOSIS — R293 Abnormal posture: Secondary | ICD-10-CM

## 2018-08-05 DIAGNOSIS — M79605 Pain in left leg: Secondary | ICD-10-CM | POA: Diagnosis not present

## 2018-08-05 DIAGNOSIS — M6281 Muscle weakness (generalized): Secondary | ICD-10-CM

## 2018-08-05 DIAGNOSIS — R262 Difficulty in walking, not elsewhere classified: Secondary | ICD-10-CM

## 2018-08-05 NOTE — Therapy (Signed)
[facility] [address], [place], [place], [address] Phone: [phone]   Fax:  [phone]  Physical Therapy Treatment  Patient Details  Name: [name] MRN: [id number] Date of Birth: [birth year] Referring Provider (PT): Letta Pate Luanna Salk, MD   Encounter Date: 08/05/2018  PT End of Session - 08/05/18 1401    Visit Number  11    Date for PT Re-Evaluation  09/30/18    PT Start Time  1400    PT Stop Time  1445    PT Time Calculation (min)  45 min    Activity Tolerance  Patient tolerated treatment well    Behavior During Therapy  [facility] for tasks assessed/performed       Past Medical History:  Diagnosis Date  . Congenital musculoskeletal deformity of spine   . Hypertension   . Incontinence of bowel 09/2013  . Lumbago   . Lumbosacral spondylosis without myelopathy   . Sleep disorder    uses gabapentin at bedtime   . Spinal stenosis, lumbar region, without neurogenic claudication     Past Surgical History:  Procedure Laterality Date  . ANKLE SURGERY Right 2015   lengthened the achilles   . AUGMENTATION MAMMAPLASTY Bilateral 2000 or 2001 unsure    Left Implant has busted and is no longer visible  . BACK SURGERY  2015   fusion , Dr Patrice Paradise at [facility]; has 2 metal rods in place , fusion from t2 to s1   . BACK SURGERY  01/2017   Dr Rennis Harding ; repair of cracked titanium rod in back lumbar   . bunionectomy  Bilateral 1980s   multiple   . COLONOSCOPY     with polypectomy ; q 5 years   . FOOT SURGERY  07/2011  . SHOULDER SURGERY Left 2013   rotator cuff   . TOTAL HIP ARTHROPLASTY Right 05/08/2017   Procedure: RIGHT TOTAL HIP ARTHROPLASTY ANTERIOR APPROACH;  Surgeon: Paralee Cancel, MD;  [facility]: [facility];  Service: Orthopedics;  Laterality: Right;  70 mins    There were no vitals filed for this visit.  Subjective Assessment - 08/05/18 1404    Subjective  I need to review my exercises     Currently in Pain?  No/denies    Multiple Pain Sites  No                       [facility] Adult PT Treatment/Exercise - 08/05/18 0001      Lumbar Exercises: Stretches   Single Knee to Chest Stretch  Right;Left;1 rep;20 seconds      Lumbar Exercises: Standing   Shoulder Extension  Strengthening;15 reps;Theraband   Blue band tied over top of door   Other Standing Lumbar Exercises  red band anirotation side arm movements 10x each       Lumbar Exercises: Supine   Clam  15 reps   2 sets   Clam Limitations  green band    Bridge  20 reps;3 seconds      Knee/Hip Exercises: Aerobic   Nustep  L3 x 10 min PTA present to discuss status      Knee/Hip Exercises: Sidelying   Hip ABduction  Strengthening;Left;2 sets;10 reps   Knee bent to shorten lever   Hip ABduction Limitations  Tried straightening out the LT leg to advance ex but pt coul dnot do well.       Manual Therapy   Soft tissue mobilization  S-L proximal  glutes and glute medius               PT Short Term Goals - 08/02/18 1455      PT SHORT TERM GOAL #1   Title  ind with initial HEP    Status  Achieved      PT SHORT TERM GOAL #2   Title  Pt will report 25% less hip pain at night    Baseline  40% less pain    Status  Achieved        PT Long Term Goals - 08/02/18 1456      PT LONG TERM GOAL #1   Title  pt will be independent with HEP and consistently doing it on her own at least 5x/week    Status  On-going      PT LONG TERM GOAL #2   Title  Pt will be able to demonstrate hip extension ROM to neutral for improved gait and upright posture    Baseline  Lt 6 degrees; Rt 4 degrees    Status  On-going      PT LONG TERM GOAL #3   Title  pt will report increased walking tolerance to >33mnutes without increased pain    Baseline  able to walk 1/2 mile    Status  Partially Met      PT LONG TERM GOAL #4   Title  pt will decrease FOTO to 47% or less    Baseline  38% limited    Status  On-going             Plan - 08/05/18 1401    Clinical Impression Statement  Pt develpoed some LT low back toward the end of the session today. Soft tissues at proximal glute and glute medius were dense/thick and tender. Soft tissue mobs helped decreased the pain in the area. Pt demonstrates improved strength and endurance today regarding her hip exercises.    Personal Factors and Comorbidities  Comorbidity 2    Comorbidities  back surgery, THA, sleep dysfunction    Examination-Activity Limitations  Bed Mobility;Locomotion Level;Sleep    Stability/Clinical Decision Making  Evolving/Moderate complexity    Rehab Potential  Excellent    PT Frequency  3x / week    PT Duration  12 weeks    PT Treatment/Interventions  ADLs/Self Care Home Management;Biofeedback;Cryotherapy;Electrical Stimulation;Iontophoresis 444mml Dexamethasone;Moist Heat;Gait training;Stair training;Functional mobility training;Therapeutic activities;Therapeutic exercise;Neuromuscular re-education;Patient/family education;Manual techniques;Dry needling;Passive range of motion;Taping;Scar mobilization;Aquatic Therapy    PT Next Visit Plan  trunk strength, pallof press for antirotation; hip abductor and extensor strength progression    PT Home Exercise Plan  Access Code: [email or web address]  Consulted and Agree with Plan of Care  Patient       Patient will benefit from skilled therapeutic intervention in order to improve the following deficits and impairments:  Abnormal gait, Difficulty walking, Impaired flexibility, Decreased strength, Decreased range of motion, Pain, Postural dysfunction  Visit Diagnosis: 1. Pain in left leg   2. Abnormal posture   3. Difficulty in walking, not elsewhere classified   4. Muscle weakness (generalized)        Problem List Patient Active Problem List   Diagnosis Date Noted  . Hip contracture, unspecified laterality 07/02/2018  . Overweight (BMI 25.0-29.9) 05/09/2017  . S/P right THA, AA 05/08/2017  .  Chronic bilateral low back pain without sciatica 04/05/2015  . Disorder of sacroiliac joint 12/22/2014  . Chronic low back pain 12/22/2014  . Acquired scoliosis 11/27/2011  .  Lumbosacral spondylosis without myelopathy 05/22/2011    [name], PTA 08/05/2018, 2:57 PM  [facility] [address], [place], [place], [address] Phone: [phone]   Fax:  [phone]  Name: [name] MRN: [id number] Date of Birth: [birth year]

## 2018-08-07 ENCOUNTER — Ambulatory Visit: Payer: Medicare Other | Admitting: Physical Therapy

## 2018-08-07 ENCOUNTER — Other Ambulatory Visit: Payer: Self-pay

## 2018-08-07 DIAGNOSIS — R262 Difficulty in walking, not elsewhere classified: Secondary | ICD-10-CM

## 2018-08-07 DIAGNOSIS — M79605 Pain in left leg: Secondary | ICD-10-CM

## 2018-08-07 DIAGNOSIS — M6281 Muscle weakness (generalized): Secondary | ICD-10-CM

## 2018-08-07 DIAGNOSIS — R293 Abnormal posture: Secondary | ICD-10-CM

## 2018-08-07 NOTE — Patient Instructions (Signed)
     [facility] Welcome to  Aquatics! Here you will find all the information you will need regarding your pool therapy. If you have further questions at any time, please call our office at [phone]. After completing your initial evaluation in the [facility], you may be eligible to complete a portion of your therapy in the pool. A typical week of therapy will consist of 1-2 typical physical therapy visits at our [facility] location and an additional session of therapy in the pool located at the [facility] at [address], [place], [place] [address].  Aquatic therapy will be offered on Friday afternoons. Each session will last approximately 30 minutes. All scheduling and payments for aquatic therapy sessions, including cancelations, will be done through our [facility] location.  To be eligible for aquatic therapy, these criteria must be met: . You must be able to independently change in the locker room and get to the pool deck. A caregiver can come with you to help if needed. There are bleachers for a caregiver to sit on next to the pool. . No one with an open wound is permitted in the pool.  Handicap parking is available in the front and there is a drop off option for even closer accessibility. Please arrive 15 minutes prior to your appointment to prepare for your pool session. You must sign in at the front desk upon your arrival. Please be sure to attend to any toileting needs prior to entering the pool. Locker rooms for changing are located to the right of the check-in desk. There is direct access to the pool deck from the locker room. You can lock your belongings in a locker but must bring a lock. Your therapist will greet you on the pool deck. There may be other swimmers in the pool at the same time but your session is one-on-one with the therapist.      

## 2018-08-07 NOTE — Therapy (Signed)
[facility] [address], [place], [place], [address] Phone: [phone]   Fax:  [phone]  Physical Therapy Treatment  Patient Details  Name: [name] MRN: [id number] Date of Birth: [birth year] Referring Provider (PT): Letta Pate Luanna Salk, MD   Encounter Date: 08/07/2018  PT End of Session - 08/07/18 1407    Visit Number  12    Date for PT Re-Evaluation  09/30/18    PT Start Time  1404    PT Stop Time  1447    PT Time Calculation (min)  43 min    Activity Tolerance  Patient tolerated treatment well    Behavior During Therapy  [facility] for tasks assessed/performed       Past Medical History:  Diagnosis Date  . Congenital musculoskeletal deformity of spine   . Hypertension   . Incontinence of bowel 09/2013  . Lumbago   . Lumbosacral spondylosis without myelopathy   . Sleep disorder    uses gabapentin at bedtime   . Spinal stenosis, lumbar region, without neurogenic claudication     Past Surgical History:  Procedure Laterality Date  . ANKLE SURGERY Right 2015   lengthened the achilles   . AUGMENTATION MAMMAPLASTY Bilateral 2000 or 2001 unsure    Left Implant has busted and is no longer visible  . BACK SURGERY  2015   fusion , Dr Patrice Paradise at [facility]; has 2 metal rods in place , fusion from t2 to s1   . BACK SURGERY  01/2017   Dr Rennis Harding ; repair of cracked titanium rod in back lumbar   . bunionectomy  Bilateral 1980s   multiple   . COLONOSCOPY     with polypectomy ; q 5 years   . FOOT SURGERY  07/2011  . SHOULDER SURGERY Left 2013   rotator cuff   . TOTAL HIP ARTHROPLASTY Right 05/08/2017   Procedure: RIGHT TOTAL HIP ARTHROPLASTY ANTERIOR APPROACH;  Surgeon: Paralee Cancel, MD;  Location: [facility];  Service: Orthopedics;  Laterality: Right;  70 mins    There were no vitals filed for this visit.  Subjective Assessment - 08/07/18 1431    Subjective  I just walked .5 miles before coming  here. Felt good after last session.    Currently in Pain?  No/denies                       [facility] Adult PT Treatment/Exercise - 08/07/18 0001      Lumbar Exercises: Stretches   Other Lumbar Stretch Exercise  Seated RT lateral trunk stretch 3x 10 sec      Lumbar Exercises: Standing   Shoulder Extension  Strengthening;Both;Theraband   2x15   Other Standing Lumbar Exercises  red band anirotation side arm movements 10x each       Lumbar Exercises: Supine   Clam  15 reps   2 sets   Clam Limitations  blue band    Dead Bug  20 reps    Dead Bug Limitations  added to HEP    Bridge with [employer]  --   6x with ball 2 sets VC control/contract gluteals   Other Supine Lumbar Exercises  Double leg lift with knees bent 2x5   VC for core contraction     Knee/Hip Exercises: Aerobic   Nustep  L3 x 10 min PTA present to discuss status      Knee/Hip Exercises: Sidelying   Hip ABduction  Strengthening;Both;2 sets;10 reps  knee bent, added yellow band at distal thigh            PT Education - 08/07/18 1438    Education Details  Supine dying bug for HEP    Person(s) Educated  Patient    Methods  Explanation;Demonstration;Tactile cues;Verbal cues;Handout    Comprehension  Returned demonstration;Verbalized understanding       PT Short Term Goals - 08/02/18 1455      PT SHORT TERM GOAL #1   Title  ind with initial HEP    Status  Achieved      PT SHORT TERM GOAL #2   Title  Pt will report 25% less hip pain at night    Baseline  40% less pain    Status  Achieved        PT Long Term Goals - 08/02/18 1456      PT LONG TERM GOAL #1   Title  pt will be independent with HEP and consistently doing it on her own at least 5x/week    Status  On-going      PT LONG TERM GOAL #2   Title  Pt will be able to demonstrate hip extension ROM to neutral for improved gait and upright posture    Baseline  Lt 6 degrees; Rt 4 degrees    Status  On-going      PT LONG TERM GOAL  #3   Title  pt will report increased walking tolerance to >23mnutes without increased pain    Baseline  able to walk 1/2 mile    Status  Partially Met      PT LONG TERM GOAL #4   Title  pt will decrease FOTO to 47% or less    Baseline  38% limited    Status  On-going            Plan - 08/07/18 1408    Clinical Impression Statement  Pt has not had much pain this pain as she is learning how to pace her activites better at home. Still no anterior LT hip/thigh pain. Pt was able to demonstrate using yellow tband for her bent knee hip abduction exercise. The quality of a straight leg hip abduction in sidelying is poor and in standing she tends to use her back too much vs her hip. Added supine core stabilization to her HEP for progression. Pt still walking .5 miles almost everyday. She plans to try doing this walk 2x a week now.    Personal Factors and Comorbidities  Comorbidity 2    Comorbidities  back surgery, THA, sleep dysfunction    Examination-Activity Limitations  Bed Mobility;Locomotion Level;Sleep    Stability/Clinical Decision Making  Evolving/Moderate complexity    Rehab Potential  Excellent    PT Frequency  3x / week    PT Duration  12 weeks    PT Treatment/Interventions  ADLs/Self Care Home Management;Biofeedback;Cryotherapy;Electrical Stimulation;Iontophoresis 418mml Dexamethasone;Moist Heat;Gait training;Stair training;Functional mobility training;Therapeutic activities;Therapeutic exercise;Neuromuscular re-education;Patient/family education;Manual techniques;Dry needling;Passive range of motion;Taping;Scar mobilization;Aquatic Therapy    PT Next Visit Plan  Review new supine core exs given today. Standing red band anti-rotation exercises, Nustep, keep strengthening LT hip, LT trunk strength    PT Home Exercise Plan  Access Code: [email or web address]  Consulted and Agree with Plan of Care  Patient       Patient will benefit from skilled therapeutic intervention in order to improve the  following deficits and impairments:  Abnormal gait, Difficulty walking, Impaired flexibility, Decreased strength, Decreased range  of motion, Pain, Postural dysfunction  Visit Diagnosis: 1. Pain in left leg   2. Abnormal posture   3. Difficulty in walking, not elsewhere classified   4. Muscle weakness (generalized)        Problem List Patient Active Problem List   Diagnosis Date Noted  . Hip contracture, unspecified laterality 07/02/2018  . Overweight (BMI 25.0-29.9) 05/09/2017  . S/P right THA, AA 05/08/2017  . Chronic bilateral low back pain without sciatica 04/05/2015  . Disorder of sacroiliac joint 12/22/2014  . Chronic low back pain 12/22/2014  . Acquired scoliosis 11/27/2011  . Lumbosacral spondylosis without myelopathy 05/22/2011    Catina Nuss, PTA 08/07/2018, 3:00 PM  [facility] [address] [place], [place], [address] Phone: [phone]   Fax:  [phone]  Name: Yanin Muhlestein MRN: [id number] Date of Birth: 05-02-1945  Access Code: [email or web address]  URL: [email or web address]  Date: 08/07/2018  Prepared by: [name]   Exercises  Supine Single Knee to Chest Stretch - 3 reps - 1 sets - 15 hold - 3x daily - 7x weekly  Hip Flexor Stretch with Chair - 3 reps - 1 sets - 15 hold - 2x daily - 7x weekly  Supine Bridge - 3 reps - 1 sets - 2 hold - 2x daily - 7x weekly  Standing Hip Extension - 10 reps - 1 sets - 2x daily - 7x weekly  Hooklying Isometric Clamshell - 10 reps - 1 sets - 2x daily - 7x weekly  Seated Sidebending - 2 reps - 1 sets - 10 hold - 3x daily - 7x weekly  Sidelying Bent Knee Lift at 45 Degrees - 6 reps - 1 sets - 2x daily - 7x weekly  Supine Shoulder Horizontal Abduction with Resistance - 10 reps - 2 sets - 1x daily - 7x weekly  Seated Shoulder Diagonal with Resistance - 10 reps - 2 sets - 1x daily - 7x weekly  Supine Piriformis Stretch with Foot on Ground - 3 reps  - 1 sets - 30 hold - 2x daily - 7x weekly  Shoulder extension with resistance - Neutral - 10 reps - 2 sets - 1x daily - 7x weekly  Supine Dead Bug with Leg Extension - 20 reps - 2 sets - 2x daily - 7x weekly  Bilateral Bent Leg Lift - 10 reps - 1 sets - 2x daily - 7x weekly

## 2018-08-09 ENCOUNTER — Encounter: Payer: Self-pay | Admitting: Physical Therapy

## 2018-08-09 ENCOUNTER — Other Ambulatory Visit: Payer: Self-pay | Admitting: Family Medicine

## 2018-08-09 ENCOUNTER — Other Ambulatory Visit: Payer: Self-pay

## 2018-08-09 ENCOUNTER — Ambulatory Visit: Payer: Medicare Other | Admitting: Physical Therapy

## 2018-08-09 DIAGNOSIS — R293 Abnormal posture: Secondary | ICD-10-CM

## 2018-08-09 DIAGNOSIS — M6281 Muscle weakness (generalized): Secondary | ICD-10-CM

## 2018-08-09 DIAGNOSIS — M79605 Pain in left leg: Secondary | ICD-10-CM

## 2018-08-09 DIAGNOSIS — Z1231 Encounter for screening mammogram for malignant neoplasm of breast: Secondary | ICD-10-CM

## 2018-08-09 DIAGNOSIS — R262 Difficulty in walking, not elsewhere classified: Secondary | ICD-10-CM

## 2018-08-09 NOTE — Therapy (Signed)
[facility] [address] [place], [place], [address] Phone: [phone]   Fax:  [phone]  Physical Therapy Treatment  Patient Details  Name: [name] MRN: [id number] Date of Birth: 08-27-1945 Referring Provider (PT): Letta Pate Luanna Salk, MD   Encounter Date: 08/09/2018  PT End of Session - 08/09/18 1332    Visit Number  13    Date for PT Re-Evaluation  09/30/18    PT Start Time  1326    PT Stop Time  1406    PT Time Calculation (min)  40 min    Activity Tolerance  Patient tolerated treatment well    Behavior During Therapy  [facility] for tasks assessed/performed       Past Medical History:  Diagnosis Date  . Congenital musculoskeletal deformity of spine   . Hypertension   . Incontinence of bowel 09/2013  . Lumbago   . Lumbosacral spondylosis without myelopathy   . Sleep disorder    uses gabapentin at bedtime   . Spinal stenosis, lumbar region, without neurogenic claudication     Past Surgical History:  Procedure Laterality Date  . ANKLE SURGERY Right 2015   lengthened the achilles   . AUGMENTATION MAMMAPLASTY Bilateral 2000 or 2001 unsure    Left Implant has busted and is no longer visible  . BACK SURGERY  2015   fusion , Dr Patrice Paradise at [facility]; has 2 metal rods in place , fusion from t2 to s1   . BACK SURGERY  01/2017   Dr Rennis Harding ; repair of cracked titanium rod in back lumbar   . bunionectomy  Bilateral 1980s   multiple   . COLONOSCOPY     with polypectomy ; q 5 years   . FOOT SURGERY  07/2011  . SHOULDER SURGERY Left 2013   rotator cuff   . TOTAL HIP ARTHROPLASTY Right 05/08/2017   Procedure: RIGHT TOTAL HIP ARTHROPLASTY ANTERIOR APPROACH;  Surgeon: Paralee Cancel, MD;  Location: [facility];  Service: Orthopedics;  Laterality: Right;  70 mins    There were no vitals filed for this visit.  Subjective Assessment - 08/09/18 1332    Subjective  I walked .5 miles again yesterday, do  that consistently.    Patient Stated Goals  be able to go for walks again    Currently in Pain?  No/denies                       [facility] Adult PT Treatment/Exercise - 08/09/18 0001      Lumbar Exercises: Stretches   Single Knee to Chest Stretch  Right;Left;1 rep;20 seconds      Lumbar Exercises: Supine   Clam  15 reps   2 sets   Clam Limitations  blue band    Dead Bug  20 reps    Dead Bug Limitations  added to HEP      Knee/Hip Exercises: Aerobic   Nustep  L3 x 10 min seat 9; UE 10; PT present to discuss status      Knee/Hip Exercises: Standing   Other Standing Knee Exercises  stepping back and side - holding green band - 10x each side      Knee/Hip Exercises: Seated   Clamshell with Marga Hoots               PT Short Term Goals - 08/02/18 1455      PT SHORT TERM GOAL #1   Title  ind with initial HEP    Status  Achieved      PT SHORT TERM GOAL #2   Title  Pt will report 25% less hip pain at night    Baseline  40% less pain    Status  Achieved        PT Long Term Goals - 08/09/18 1334      PT LONG TERM GOAL #1   Title  pt will be independent with HEP and consistently doing it on her own at least 5x/week    Status  On-going      PT LONG TERM GOAL #2   Title  Pt will be able to demonstrate hip extension ROM to neutral for improved gait and upright posture    Status  On-going      PT LONG TERM GOAL #3   Title  pt will report increased walking tolerance to >76mnutes without increased pain    Baseline  able to walk 1/2 mile, 10 minutes    Status  Partially Met      PT LONG TERM GOAL #4   Title  pt will decrease FOTO to 47% or less    Status  On-going            Plan - 08/09/18 1415    Clinical Impression Statement  Pt denies pain today.  She was able to tolerate exercises.  Standing exercises were challenging and needed cues to take breaks when her form got worse.  She was able to finish out sets with breaks as needed.  Pt did  well with dead bug exercise without modification.  Pt needs a lot of encouragement throughout session.  She will continue to benefit from skilled PT to progress strength for improved gait.    PT Treatment/Interventions  ADLs/Self Care Home Management;Biofeedback;Cryotherapy;Electrical Stimulation;Iontophoresis 496mml Dexamethasone;Moist Heat;Gait training;Stair training;Functional mobility training;Therapeutic activities;Therapeutic exercise;Neuromuscular re-education;Patient/family education;Manual techniques;Dry needling;Passive range of motion;Taping;Scar mobilization;Aquatic Therapy    PT Next Visit Plan  f/u on sitting with small towel under her Rt ischium, Standing red band anti-rotation exercises, Nustep, keep strengthening LT hip, LT trunk strength    PT Home Exercise Plan  Access Code: [email or web address]  Consulted and Agree with Plan of Care  Patient       Patient will benefit from skilled therapeutic intervention in order to improve the following deficits and impairments:  Abnormal gait, Difficulty walking, Impaired flexibility, Decreased strength, Decreased range of motion, Pain, Postural dysfunction  Visit Diagnosis: 1. Pain in left leg   2. Abnormal posture   3. Difficulty in walking, not elsewhere classified   4. Muscle weakness (generalized)        Problem List Patient Active Problem List   Diagnosis Date Noted  . Hip contracture, unspecified laterality 07/02/2018  . Overweight (BMI 25.0-29.9) 05/09/2017  . S/P right THA, AA 05/08/2017  . Chronic bilateral low back pain without sciatica 04/05/2015  . Disorder of sacroiliac joint 12/22/2014  . Chronic low back pain 12/22/2014  . Acquired scoliosis 11/27/2011  . Lumbosacral spondylosis without myelopathy 05/22/2011    JaJule SerPT 08/09/2018, 2:20 PM  [facility] [address]: [phone] Fax:  [phone]:  Suzanne PinardRN: [id number] of Birth: 06/10/16/1947

## 2018-08-12 ENCOUNTER — Encounter: Payer: Self-pay | Admitting: Physical Therapy

## 2018-08-12 ENCOUNTER — Other Ambulatory Visit: Payer: Self-pay

## 2018-08-12 ENCOUNTER — Ambulatory Visit: Payer: Medicare Other | Attending: Physical Medicine & Rehabilitation | Admitting: Physical Therapy

## 2018-08-12 DIAGNOSIS — M6281 Muscle weakness (generalized): Secondary | ICD-10-CM | POA: Insufficient documentation

## 2018-08-12 DIAGNOSIS — M79605 Pain in left leg: Secondary | ICD-10-CM

## 2018-08-12 DIAGNOSIS — R293 Abnormal posture: Secondary | ICD-10-CM | POA: Diagnosis present

## 2018-08-12 DIAGNOSIS — R262 Difficulty in walking, not elsewhere classified: Secondary | ICD-10-CM | POA: Diagnosis present

## 2018-08-12 NOTE — Therapy (Signed)
[facility] [address] [place], [place], [address] Phone: [phone]   Fax:  [phone]  Physical Therapy Treatment  Patient Details  Name: [name] MRN: [id number] Date of Birth: Oct 26, 1945 Referring Provider (PT): Letta Pate Luanna Salk, MD   Encounter Date: 08/12/2018  PT End of Session - 08/12/18 1448    Visit Number  14    Date for PT Re-Evaluation  09/30/18    PT Start Time  1446    PT Stop Time  1524    PT Time Calculation (min)  38 min    Activity Tolerance  Patient tolerated treatment well    Behavior During Therapy  [facility] for tasks assessed/performed       Past Medical History:  Diagnosis Date  . Congenital musculoskeletal deformity of spine   . Hypertension   . Incontinence of bowel 09/2013  . Lumbago   . Lumbosacral spondylosis without myelopathy   . Sleep disorder    uses gabapentin at bedtime   . Spinal stenosis, lumbar region, without neurogenic claudication     Past Surgical History:  Procedure Laterality Date  . ANKLE SURGERY Right 2015   lengthened the achilles   . AUGMENTATION MAMMAPLASTY Bilateral 2000 or 2001 unsure    Left Implant has busted and is no longer visible  . BACK SURGERY  2015   fusion , Dr Patrice Paradise at [facility]; has 2 metal rods in place , fusion from t2 to s1   . BACK SURGERY  01/2017   Dr Rennis Harding ; repair of cracked titanium rod in back lumbar   . bunionectomy  Bilateral 1980s   multiple   . COLONOSCOPY     with polypectomy ; q 5 years   . FOOT SURGERY  07/2011  . SHOULDER SURGERY Left 2013   rotator cuff   . TOTAL HIP ARTHROPLASTY Right 05/08/2017   Procedure: RIGHT TOTAL HIP ARTHROPLASTY ANTERIOR APPROACH;  Surgeon: Paralee Cancel, MD;  Location: [facility];  Service: Orthopedics;  Laterality: Right;  70 mins    There were no vitals filed for this visit.  Subjective Assessment - 08/12/18 1449    Subjective  I think I overdo my exercises. I did a lot  around the house already today and i probably should not do that so much on PT days.    Currently in Pain?  No/denies    Aggravating Factors   Making the bed, doing a lot of housework    Pain Relieving Factors  self massage, rest/pacing activities.    Multiple Pain Sites  No                       [facility] Adult PT Treatment/Exercise - 08/12/18 0001      Lumbar Exercises: Standing   Other Standing Lumbar Exercises  red band anirotation side arm movements 10x each    2x10 PTA stabilizing pelvis     Lumbar Exercises: Supine   Dead Bug  20 reps    Other Supine Lumbar Exercises  yellow band diagonals 10x bil yellow band supine      Lumbar Exercises: Sidelying   Hip Abduction  Left;20 reps   2x10 with yellow band knee bent     Knee/Hip Exercises: Aerobic   Nustep  L4 x 10 min with discussion of current status      Knee/Hip Exercises: Standing   Other Standing Knee Exercises  side stepping green band along counter top 6x  Knee/Hip Exercises: Supine   Bridges with Clamshell  Strengthening;Both;2 sets;10 reps   green band              PT Short Term Goals - 08/02/18 1455      PT SHORT TERM GOAL #1   Title  ind with initial HEP    Status  Achieved      PT SHORT TERM GOAL #2   Title  Pt will report 25% less hip pain at night    Baseline  40% less pain    Status  Achieved        PT Long Term Goals - 08/09/18 1334      PT LONG TERM GOAL #1   Title  pt will be independent with HEP and consistently doing it on her own at least 5x/week    Status  On-going      PT LONG TERM GOAL #2   Title  Pt will be able to demonstrate hip extension ROM to neutral for improved gait and upright posture    Status  On-going      PT LONG TERM GOAL #3   Title  pt will report increased walking tolerance to >61mnutes without increased pain    Baseline  able to walk 1/2 mile, 10 minutes    Status  Partially Met      PT LONG TERM GOAL #4   Title  pt will decrease FOTO  to 47% or less    Status  On-going            Plan - 08/12/18 1448    Clinical Impression Statement  Pt reports her endurance for her ADLS is getting better she noticed over the weekend. Noticable improvement using the yellow band for resisatnce for her sidelying hip abduction, pt can contract her gluteals enough to affect her gait: less Trendelenberg.    Personal Factors and Comorbidities  Comorbidity 2    Comorbidities  back surgery, THA, sleep dysfunction    Examination-Activity Limitations  Bed Mobility;Locomotion Level;Sleep    Stability/Clinical Decision Making  Evolving/Moderate complexity    Rehab Potential  Excellent    PT Frequency  3x / week    PT Duration  12 weeks    PT Treatment/Interventions  ADLs/Self Care Home Management;Biofeedback;Cryotherapy;Electrical Stimulation;Iontophoresis 433mml Dexamethasone;Moist Heat;Gait training;Stair training;Functional mobility training;Therapeutic activities;Therapeutic exercise;Neuromuscular re-education;Patient/family education;Manual techniques;Dry needling;Passive range of motion;Taping;Scar mobilization;Aquatic Therapy    PT Next Visit Plan  Aquatic therapy next session    PT Home Exercise Plan  Access Code: [email or web address]  Consulted and Agree with Plan of Care  Patient       Patient will benefit from skilled therapeutic intervention in order to improve the following deficits and impairments:  Abnormal gait, Difficulty walking, Impaired flexibility, Decreased strength, Decreased range of motion, Pain, Postural dysfunction  Visit Diagnosis: 1. Pain in left leg   2. Abnormal posture   3. Difficulty in walking, not elsewhere classified   4. Muscle weakness (generalized)        Problem List Patient Active Problem List   Diagnosis Date Noted  . Hip contracture, unspecified laterality 07/02/2018  . Overweight (BMI 25.0-29.9) 05/09/2017  . S/P right THA, AA 05/08/2017  . Chronic bilateral low back pain without sciatica  04/05/2015  . Disorder of sacroiliac joint 12/22/2014  . Chronic low back pain 12/22/2014  . Acquired scoliosis 11/27/2011  . Lumbosacral spondylosis without myelopathy 05/22/2011    Jhalil Silvera, PTA 08/12/2018, 3:25 PM  [facility]  [place] [address], [place], [place], [address] Phone: [phone]   Fax:  [phone]  Name: Kyerra Vargo MRN: [id number] Date of Birth: 18-Jul-1945

## 2018-08-16 ENCOUNTER — Other Ambulatory Visit: Payer: Self-pay

## 2018-08-16 ENCOUNTER — Ambulatory Visit: Payer: Medicare Other | Admitting: Physical Therapy

## 2018-08-16 ENCOUNTER — Encounter: Payer: Self-pay | Admitting: Physical Therapy

## 2018-08-16 DIAGNOSIS — M79605 Pain in left leg: Secondary | ICD-10-CM | POA: Diagnosis not present

## 2018-08-16 DIAGNOSIS — M6281 Muscle weakness (generalized): Secondary | ICD-10-CM

## 2018-08-16 DIAGNOSIS — R293 Abnormal posture: Secondary | ICD-10-CM

## 2018-08-16 DIAGNOSIS — R262 Difficulty in walking, not elsewhere classified: Secondary | ICD-10-CM

## 2018-08-16 NOTE — Therapy (Signed)
[facility] [address] [place], [place], [address] Phone: [phone]   Fax:  [phone]  Physical Therapy Treatment  Patient Details  Name: [name] MRN: [id number] Date of Birth: [birth year] Referring Provider (PT): Letta Pate Luanna Salk, MD   Encounter Date: 08/16/2018  PT End of Session - 08/16/18 1635    Visit Number  15    Date for PT Re-Evaluation  09/30/18    PT Start Time  1345    PT Stop Time  1430    PT Time Calculation (min)  45 min    Activity Tolerance  Patient tolerated treatment well    Behavior During Therapy  [facility] for tasks assessed/performed       Past Medical History:  Diagnosis Date  . Congenital musculoskeletal deformity of spine   . Hypertension   . Incontinence of bowel 09/2013  . Lumbago   . Lumbosacral spondylosis without myelopathy   . Sleep disorder    uses gabapentin at bedtime   . Spinal stenosis, lumbar region, without neurogenic claudication     Past Surgical History:  Procedure Laterality Date  . ANKLE SURGERY Right 2015   lengthened the achilles   . AUGMENTATION MAMMAPLASTY Bilateral 2000 or 2001 unsure    Left Implant has busted and is no longer visible  . BACK SURGERY  2015   fusion , Dr Patrice Paradise at [facility]; has 2 metal rods in place , fusion from t2 to s1   . BACK SURGERY  01/2017   Dr Rennis [place] ; repair of cracked titanium rod in back lumbar   . bunionectomy  Bilateral 1980s   multiple   . COLONOSCOPY     with polypectomy ; q 5 years   . FOOT SURGERY  07/2011  . SHOULDER SURGERY Left 2013   rotator cuff   . TOTAL HIP ARTHROPLASTY Right 05/08/2017   Procedure: RIGHT TOTAL HIP ARTHROPLASTY ANTERIOR APPROACH;  Surgeon: Paralee Cancel, MD;  Location: [facility];  Service: Orthopedics;  Laterality: Right;  70 mins    There were no vitals filed for this visit.  Subjective Assessment - 08/16/18 1634    Subjective  I feel pretty good I just get so  frustrated with my walking. I can only walk straight for a short period.    Currently in Pain?  No/denies    Multiple Pain Sites  No      Water temperature 84.7 degrees Pt entered and exited the pool via long ramp using bil hand rails.  Seated: Ankle circles and pumps 20x Knee flexion and extension 15x with concurrent verbal education on how we use the properties of buoyancy, viscosity, and refraction for our session today. Pt verbally understood principles.  Standing: Walking 2x in 4 diections using the medium size noodle for lumbar support. VC for standing straight, not leaning RT.   Medium noodle presses for core contraction 5 sec hold 10x Arm/shoulder extensions with aqua jogger anteriorly worn waist deep, 15x  Decompression float with 4 noodles x 3 min and again at end of session  PTA assisted Rt lateral trunk stretching in supported supine using aqua jogger. 10x                           PT Short Term Goals - 08/02/18 1455      PT SHORT TERM GOAL #1   Title  ind with initial HEP    Status  Achieved  PT SHORT TERM GOAL #2   Title  Pt will report 25% less hip pain at night    Baseline  40% less pain    Status  Achieved        PT Long Term Goals - 08/09/18 1334      PT LONG TERM GOAL #1   Title  pt will be independent with HEP and consistently doing it on her own at least 5x/week    Status  On-going      PT LONG TERM GOAL #2   Title  Pt will be able to demonstrate hip extension ROM to neutral for improved gait and upright posture    Status  On-going      PT LONG TERM GOAL #3   Title  pt will report increased walking tolerance to >42mnutes without increased pain    Baseline  able to walk 1/2 mile, 10 minutes    Status  Partially Met      PT LONG TERM GOAL #4   Title  pt will decrease FOTO to 47% or less    Status  On-going            Plan - 08/16/18 1635    Clinical Impression Statement  Pt was able to walk longer distances  in the pool standing straight, no RT sided lean. This was tiring for pt to do, but she reported "feeling so good to be standing straight." Via property of refraction and the use of the aqua jogger, pt was able to facilitate her spinal extensors and not have pain. Additionally pt felt the buoyancy of the water helped to unload her back making mobility much less difficult.    Personal Factors and Comorbidities  Comorbidity 2    Comorbidities  back surgery, THA, sleep dysfunction    Examination-Activity Limitations  Bed Mobility;Locomotion Level;Sleep    Stability/Clinical Decision Making  Evolving/Moderate complexity    Rehab Potential  Excellent    PT Frequency  3x / week    PT Duration  12 weeks    PT Treatment/Interventions  ADLs/Self Care Home Management;Biofeedback;Cryotherapy;Electrical Stimulation;Iontophoresis 460mml Dexamethasone;Moist Heat;Gait training;Stair training;Functional mobility training;Therapeutic activities;Therapeutic exercise;Neuromuscular re-education;Patient/family education;Manual techniques;Dry needling;Passive range of motion;Taping;Scar mobilization;Aquatic Therapy    PT Next Visit Plan  Assess how pt felt after aquatic therapy    PT Home Exercise Plan  Access Code: [email or web address]  Consulted and Agree with Plan of Care  Patient       Patient will benefit from skilled therapeutic intervention in order to improve the following deficits and impairments:  Abnormal gait, Difficulty walking, Impaired flexibility, Decreased strength, Decreased range of motion, Pain, Postural dysfunction  Visit Diagnosis: 1. Pain in left leg   2. Abnormal posture   3. Difficulty in walking, not elsewhere classified   4. Muscle weakness (generalized)        Problem List Patient Active Problem List   Diagnosis Date Noted  . Hip contracture, unspecified laterality 07/02/2018  . Overweight (BMI 25.0-29.9) 05/09/2017  . S/P right THA, AA 05/08/2017  . Chronic bilateral low back pain  without sciatica 04/05/2015  . Disorder of sacroiliac joint 12/22/2014  . Chronic low back pain 12/22/2014  . Acquired scoliosis 11/27/2011  . Lumbosacral spondylosis without myelopathy 05/22/2011    Carlean Crowl, PTA 08/16/2018, 4:42 PM  [facility] [address]: [phone] Fax:  [phone]: Suzanne RamaswamyRN: [id number] of Birth: 6/02-27-1947

## 2018-08-21 ENCOUNTER — Other Ambulatory Visit: Payer: Self-pay

## 2018-08-21 ENCOUNTER — Ambulatory Visit: Payer: Medicare Other | Admitting: Physical Therapy

## 2018-08-21 ENCOUNTER — Encounter: Payer: Self-pay | Admitting: Physical Therapy

## 2018-08-21 DIAGNOSIS — M79605 Pain in left leg: Secondary | ICD-10-CM

## 2018-08-21 DIAGNOSIS — M6281 Muscle weakness (generalized): Secondary | ICD-10-CM

## 2018-08-21 DIAGNOSIS — R262 Difficulty in walking, not elsewhere classified: Secondary | ICD-10-CM

## 2018-08-21 DIAGNOSIS — R293 Abnormal posture: Secondary | ICD-10-CM

## 2018-08-21 NOTE — Therapy (Signed)
[name] [address], [place], [place], [address] Phone: [phone]   Fax:  [phone]  Physical Therapy Treatment  Patient Details  Name: [name] MRN: [id number] Date of Birth: 1945-01-13 Referring Provider (PT): [name] Suzanne Salk, MD   Encounter Date: 08/21/2018  PT End of Session - 08/21/18 1446    Visit Number  16    Date for PT Re-Evaluation  09/30/18    PT Start Time  7711    PT Stop Time  1526    PT Time Calculation (min)  41 min    Activity Tolerance  Patient tolerated treatment well    Behavior During Therapy  [name] for tasks assessed/performed       Past Medical History:  Diagnosis Date  . Congenital musculoskeletal deformity of spine   . Hypertension   . Incontinence of bowel 09/2013  . Lumbago   . Lumbosacral spondylosis without myelopathy   . Sleep disorder    uses gabapentin at bedtime   . Spinal stenosis, lumbar region, without neurogenic claudication     Past Surgical History:  Procedure Laterality Date  . ANKLE SURGERY Right 2015   lengthened the achilles   . AUGMENTATION MAMMAPLASTY Bilateral 2000 or 2001 unsure    Left Implant has busted and is no longer visible  . BACK SURGERY  2015   fusion , Dr [name] at [facility]; has 2 metal rods in place , fusion from t2 to s1   . BACK SURGERY  01/2017   Dr [name] ; repair of cracked titanium rod in back lumbar   . bunionectomy  Bilateral 1980s   multiple   . COLONOSCOPY     with polypectomy ; q 5 years   . FOOT SURGERY  07/2011  . SHOULDER SURGERY Left 2013   rotator cuff   . TOTAL HIP ARTHROPLASTY Right 05/08/2017   Procedure: RIGHT TOTAL HIP ARTHROPLASTY ANTERIOR APPROACH;  Surgeon: Suzanne Cancel, MD;  Location: [facility];  Service: Orthopedics;  Laterality: Right;  70 mins    There were no vitals filed for this visit.  Subjective Assessment - 08/21/18 1447    Subjective  The pool was great. I tried walking a  mile this weekend and I was miserable.    Currently in Pain?  --   Nothing now, but this AM my back was sore.        [facility] PT Assessment - 08/21/18 0001      Strength   Left Hip Flexion  5/5    Left Hip External Rotation  4/5    Left Hip ABduction  4/5                   [facility] Adult PT Treatment/Exercise - 08/21/18 0001      Lumbar Exercises: Stretches   Standing Side Bend  Right;5 reps    Standing Side Bend Limitations  green ball on the mat      Lumbar Exercises: Seated   Other Seated Lumbar Exercises  On green ball; diagonals 10x, green 10x : horizontal abd  seated on ball 2x10      Knee/Hip Exercises: Aerobic   Nustep  L4 x 10 min with review of status/pool       Manual Therapy   Soft tissue mobilization  Addaday assised                PT Short Term Goals - 08/02/18 1455  PT SHORT TERM GOAL #1   Title  ind with initial HEP    Status  Achieved      PT SHORT TERM GOAL #2   Title  Pt will report 25% less hip pain at night    Baseline  40% less pain    Status  Achieved        PT Long Term Goals - 08/09/18 1334      PT LONG TERM GOAL #1   Title  pt will be independent with HEP and consistently doing it on her own at least 5x/week    Status  On-going      PT LONG TERM GOAL #2   Title  Pt will be able to demonstrate hip extension ROM to neutral for improved gait and upright posture    Status  On-going      PT LONG TERM GOAL #3   Title  pt will report increased walking tolerance to >59mnutes without increased pain    Baseline  able to walk 1/2 mile, 10 minutes    Status  Partially Met      PT LONG TERM GOAL #4   Title  pt will decrease FOTO to 47% or less    Status  On-going            Plan - 08/21/18 1505    Clinical Impression Statement  Pt attempted walking 1 mile this weekend. This produced significant back pain. We discussed either walking 1/2 mile 2x a day or just walking 1/2 mile 1x day. Pt was able to perform trunk  strngthening exs on the physioball which she has at home. Pt became pretty sore from her exercises so we used the Addaday t owork on her LT flank, lateral hip and proximal glutes. This abolished the soreness. Pt felt great with her aquatic therapy, no pain and she loved being able to stand up straight.    PT Next Visit Plan  Aquatics next    PT Home Exercise Plan  Access Code: [email or web address]   Consulted and Agree with Plan of Care  Patient       Patient will benefit from skilled therapeutic intervention in order to improve the following deficits and impairments:  Abnormal gait, Difficulty walking, Impaired flexibility, Decreased strength, Decreased range of motion, Pain, Postural dysfunction  Visit Diagnosis: 1. Pain in left leg   2. Abnormal posture   3. Difficulty in walking, not elsewhere classified   4. Muscle weakness (generalized)        Problem List Patient Active Problem List   Diagnosis Date Noted  . Hip contracture, unspecified laterality 07/02/2018  . Overweight (BMI 25.0-29.9) 05/09/2017  . S/P right THA, AA 05/08/2017  . Chronic bilateral low back pain without sciatica 04/05/2015  . Disorder of sacroiliac joint 12/22/2014  . Chronic low back pain 12/22/2014  . Acquired scoliosis 11/27/2011  . Lumbosacral spondylosis without myelopathy 05/22/2011    [name], PTA 08/21/2018, 3:30 PM  [name] [address] [place] [address]Phone: [phone]  Fax:  [phone] Name: Suzanne YazdiMRN: [id number] of Birth: [birth year]

## 2018-08-23 ENCOUNTER — Encounter: Payer: Self-pay | Admitting: Physical Therapy

## 2018-08-23 ENCOUNTER — Ambulatory Visit: Payer: Medicare Other | Admitting: Physical Therapy

## 2018-08-23 ENCOUNTER — Other Ambulatory Visit: Payer: Self-pay

## 2018-08-23 DIAGNOSIS — M6281 Muscle weakness (generalized): Secondary | ICD-10-CM

## 2018-08-23 DIAGNOSIS — M79605 Pain in left leg: Secondary | ICD-10-CM

## 2018-08-23 DIAGNOSIS — R262 Difficulty in walking, not elsewhere classified: Secondary | ICD-10-CM

## 2018-08-23 DIAGNOSIS — R293 Abnormal posture: Secondary | ICD-10-CM

## 2018-08-23 NOTE — Therapy (Signed)
[facility] [address] [place], [place], [address] Phone: [phone]   Fax:  [phone]  Physical Therapy Treatment  Patient Details  Name: [name] MRN: [id number] Date of Birth: [birth year] Referring Provider (PT): Letta Pate Luanna Salk, MD   Encounter Date: 08/23/2018  PT End of Session - 08/23/18 1704    Visit Number  17    Date for PT Re-Evaluation  09/30/18    PT Start Time  1345    PT Stop Time  1425    PT Time Calculation (min)  40 min    Activity Tolerance  Patient limited by lethargy    Behavior During Therapy  [facility] for tasks assessed/performed       Past Medical History:  Diagnosis Date  . Congenital musculoskeletal deformity of spine   . Hypertension   . Incontinence of bowel 09/2013  . Lumbago   . Lumbosacral spondylosis without myelopathy   . Sleep disorder    uses gabapentin at bedtime   . Spinal stenosis, lumbar region, without neurogenic claudication     Past Surgical History:  Procedure Laterality Date  . ANKLE SURGERY Right 2015   lengthened the achilles   . AUGMENTATION MAMMAPLASTY Bilateral 2000 or 2001 unsure    Left Implant has busted and is no longer visible  . BACK SURGERY  2015   fusion , Dr Patrice Paradise at [facility]; has 2 metal rods in place , fusion from t2 to s1   . BACK SURGERY  01/2017   Dr Rennis [place] ; repair of cracked titanium rod in back lumbar   . bunionectomy  Bilateral 1980s   multiple   . COLONOSCOPY     with polypectomy ; q 5 years   . FOOT SURGERY  07/2011  . SHOULDER SURGERY Left 2013   rotator cuff   . TOTAL HIP ARTHROPLASTY Right 05/08/2017   Procedure: RIGHT TOTAL HIP ARTHROPLASTY ANTERIOR APPROACH;  Surgeon: Paralee Cancel, MD;  Location: [facility];  Service: Orthopedics;  Laterality: Right;  70 mins    There were no vitals filed for this visit.  Subjective Assessment - 08/23/18 1701    Subjective  I way over did it on Wednesday. Then the next  day I walked too much and had a really bad night. Laying on the heating pad helped.       Treatment: Water temperature 84.7 degrees F Pt entered and exited the pool via long ramp with hand rails.  Supported by aqua jogger back float for passive lateral trunk flexion ROM by PTA x 2 min 2 lengths of the waist deep water walking for each direction. Needed a large noodle to help assist the pt's back on last length as she was fatiguing along the left side of her back.   Standing hip circles LT 2x10  1 min bicycle with 2 large noodles behind pt  5 min decompression hang to end session with 1 noodle behind pt for support & buoyancy                           PT Short Term Goals - 08/02/18 1455      PT SHORT TERM GOAL #1   Title  ind with initial HEP    Status  Achieved      PT SHORT TERM GOAL #2   Title  Pt will report 25% less hip pain at night    Baseline  40%  less pain    Status  Achieved        PT Long Term Goals - 08/09/18 1334      PT LONG TERM GOAL #1   Title  pt will be independent with HEP and consistently doing it on her own at least 5x/week    Status  On-going      PT LONG TERM GOAL #2   Title  Pt will be able to demonstrate hip extension ROM to neutral for improved gait and upright posture    Status  On-going      PT LONG TERM GOAL #3   Title  pt will report increased walking tolerance to >19mnutes without increased pain    Baseline  able to walk 1/2 mile, 10 minutes    Status  Partially Met      PT LONG TERM GOAL #4   Title  pt will decrease FOTO to 47% or less    Status  On-going            Plan - 08/23/18 1705    Clinical Impression Statement  Pt reports overdoing her exercises on Wednesdays appt. She feels she should have stayed with red band and maybe she wasn't ready to sit on the ball? She arrives today with no pain but a sense of lethargy. PTA discussed with patient the importance of energy balance and pacing herself. She  admittsto having a very difficult time doing this. Decompression and lessening the effects of weightbearing on her spine helped her move/exercise more comfortably.    Personal Factors and Comorbidities  Comorbidity 2    Comorbidities  back surgery, THA, sleep dysfunction    Examination-Activity Limitations  Bed Mobility;Locomotion Level;Sleep    Rehab Potential  Excellent    PT Frequency  3x / week    PT Duration  12 weeks    PT Treatment/Interventions  ADLs/Self Care Home Management;Biofeedback;Cryotherapy;Electrical Stimulation;Iontophoresis 438mml Dexamethasone;Moist Heat;Gait training;Stair training;Functional mobility training;Therapeutic activities;Therapeutic exercise;Neuromuscular re-education;Patient/family education;Manual techniques;Dry needling;Passive range of motion;Taping;Scar mobilization;Aquatic Therapy    PT Next Visit Plan  Gentle trunk strength, Nustep    PT Home Exercise Plan  Access Code: [email or web address]  Consulted and Agree with Plan of Care  Patient       Patient will benefit from skilled therapeutic intervention in order to improve the following deficits and impairments:  Abnormal gait, Difficulty walking, Impaired flexibility, Decreased strength, Decreased range of motion, Pain, Postural dysfunction  Visit Diagnosis: 1. Pain in left leg   2. Abnormal posture   3. Difficulty in walking, not elsewhere classified   4. Muscle weakness (generalized)        Problem List Patient Active Problem List   Diagnosis Date Noted  . Hip contracture, unspecified laterality 07/02/2018  . Overweight (BMI 25.0-29.9) 05/09/2017  . S/P right THA, AA 05/08/2017  . Chronic bilateral low back pain without sciatica 04/05/2015  . Disorder of sacroiliac joint 12/22/2014  . Chronic low back pain 12/22/2014  . Acquired scoliosis 11/27/2011  . Lumbosacral spondylosis without myelopathy 05/22/2011    Vlada Uriostegui, PTA 08/23/2018, 5:11 PM  [facility] [address]one: [phone] Fax:  [phone]: HeAnalaya HoeyRN: [id number] of Birth: [birth year]

## 2018-08-28 ENCOUNTER — Other Ambulatory Visit: Payer: Self-pay

## 2018-08-28 ENCOUNTER — Ambulatory Visit: Payer: Medicare Other | Admitting: Physical Therapy

## 2018-08-28 ENCOUNTER — Encounter: Payer: Self-pay | Admitting: Physical Therapy

## 2018-08-28 DIAGNOSIS — R293 Abnormal posture: Secondary | ICD-10-CM

## 2018-08-28 DIAGNOSIS — M79605 Pain in left leg: Secondary | ICD-10-CM

## 2018-08-28 DIAGNOSIS — M6281 Muscle weakness (generalized): Secondary | ICD-10-CM

## 2018-08-28 DIAGNOSIS — R262 Difficulty in walking, not elsewhere classified: Secondary | ICD-10-CM

## 2018-08-28 NOTE — Therapy (Signed)
[facility] [address] [place], [place], [address] Phone: [phone]   Fax:  [phone]  Physical Therapy Treatment  Patient Details  Name: [name] MRN: [id number] Date of Birth: 1945-03-18 Referring Provider (PT): Letta Pate Luanna Salk, MD   Encounter Date: 08/28/2018  PT End of Session - 08/28/18 1444    Visit Number  18    Date for PT Re-Evaluation  09/30/18    PT Start Time  1436    PT Stop Time  1521    PT Time Calculation (min)  45 min    Activity Tolerance  Patient tolerated treatment well    Behavior During Therapy  [facility] for tasks assessed/performed       Past Medical History:  Diagnosis Date  . Congenital musculoskeletal deformity of spine   . Hypertension   . Incontinence of bowel 09/2013  . Lumbago   . Lumbosacral spondylosis without myelopathy   . Sleep disorder    uses gabapentin at bedtime   . Spinal stenosis, lumbar region, without neurogenic claudication     Past Surgical History:  Procedure Laterality Date  . ANKLE SURGERY Right 2015   lengthened the achilles   . AUGMENTATION MAMMAPLASTY Bilateral 2000 or 2001 unsure    Left Implant has busted and is no longer visible  . BACK SURGERY  2015   fusion , Dr Patrice Paradise at [facility]; has 2 metal rods in place , fusion from t2 to s1   . BACK SURGERY  01/2017   Dr Rennis [place] ; repair of cracked titanium rod in back lumbar   . bunionectomy  Bilateral 1980s   multiple   . COLONOSCOPY     with polypectomy ; q 5 years   . FOOT SURGERY  07/2011  . SHOULDER SURGERY Left 2013   rotator cuff   . TOTAL HIP ARTHROPLASTY Right 05/08/2017   Procedure: RIGHT TOTAL HIP ARTHROPLASTY ANTERIOR APPROACH;  Surgeon: Paralee Cancel, MD;  Location: [facility];  Service: Orthopedics;  Laterality: Right;  70 mins    There were no vitals filed for this visit.  Subjective Assessment - 08/28/18 1447    Subjective  I felt so much better after being in the  pool. I am pacing myself better this week and I feel better when I do. Pt walked 20 min this AM.    Currently in Pain?  No/denies    Multiple Pain Sites  No                       [facility] Adult PT Treatment/Exercise - 08/28/18 0001      Lumbar Exercises: Stretches   Standing Side Bend  Right;5 reps    Standing Side Bend Limitations  green ball on the mat      Lumbar Exercises: Standing   Row  Strengthening;Both;20 reps;Theraband    Theraband Level (Row)  Level 4 (Blue)    Shoulder Extension  Strengthening;Both;20 reps;Theraband    Theraband Level (Shoulder Extension)  Level 4 (Blue)    Shoulder Extension Limitations  VC for posture and core.       Lumbar Exercises: Supine   Ab Set  --   Cocontraction add/glutes/TA 3 sec hold 10x     Knee/Hip Exercises: Stretches   Hip Flexor Stretch  Both;2 reps;30 seconds      Knee/Hip Exercises: Aerobic   Nustep  L4 x 10 min with review of status/pool  Manual Therapy   Soft tissue mobilization  Addaday assised    Lt thoracic/mid back/later hip /gluteals              PT Short Term Goals - 08/02/18 1455      PT SHORT TERM GOAL #1   Title  ind with initial HEP    Status  Achieved      PT SHORT TERM GOAL #2   Title  Pt will report 25% less hip pain at night    Baseline  40% less pain    Status  Achieved        PT Long Term Goals - 08/09/18 1334      PT LONG TERM GOAL #1   Title  pt will be independent with HEP and consistently doing it on her own at least 5x/week    Status  On-going      PT LONG TERM GOAL #2   Title  Pt will be able to demonstrate hip extension ROM to neutral for improved gait and upright posture    Status  On-going      PT LONG TERM GOAL #3   Title  pt will report increased walking tolerance to >75mnutes without increased pain    Baseline  able to walk 1/2 mile, 10 minutes    Status  Partially Met      PT LONG TERM GOAL #4   Title  pt will decrease FOTO to 47% or less     Status  On-going            Plan - 08/28/18 1445    Clinical Impression Statement  Pt arrives today feeling much better than when she came to aquatic therapy on Friday. She is understanding better how to pace herself at home including her walking for ADLS and recreation/exercise.    Personal Factors and Comorbidities  Comorbidity 2    Comorbidities  back surgery, THA, sleep dysfunction    Examination-Activity Limitations  Bed Mobility;Locomotion Level;Sleep    Stability/Clinical Decision Making  Evolving/Moderate complexity    Rehab Potential  Excellent    PT Frequency  3x / week    PT Duration  12 weeks    PT Treatment/Interventions  ADLs/Self Care Home Management;Biofeedback;Cryotherapy;Electrical Stimulation;Iontophoresis 469mml Dexamethasone;Moist Heat;Gait training;Stair training;Functional mobility training;Therapeutic activities;Therapeutic exercise;Neuromuscular re-education;Patient/family education;Manual techniques;Dry needling;Passive range of motion;Taping;Scar mobilization;Aquatic Therapy    PT Next Visit Plan  Aquatics next    PT Home Exercise Plan  Access Code: [email or web address]  Consulted and Agree with Plan of Care  Patient       Patient will benefit from skilled therapeutic intervention in order to improve the following deficits and impairments:  Abnormal gait, Difficulty walking, Impaired flexibility, Decreased strength, Decreased range of motion, Pain, Postural dysfunction  Visit Diagnosis: 1. Pain in left leg   2. Abnormal posture   3. Difficulty in walking, not elsewhere classified   4. Muscle weakness (generalized)        Problem List Patient Active Problem List   Diagnosis Date Noted  . Hip contracture, unspecified laterality 07/02/2018  . Overweight (BMI 25.0-29.9) 05/09/2017  . S/P right THA, AA 05/08/2017  . Chronic bilateral low back pain without sciatica 04/05/2015  . Disorder of sacroiliac joint 12/22/2014  . Chronic low back pain 12/22/2014  .  Acquired scoliosis 11/27/2011  . Lumbosacral spondylosis without myelopathy 05/22/2011    [name],PTA 08/28/2018, 3:27 PM  [facility] [address] [place]  [address] Phone: [phone]   Fax:  [phone]  Name: [name] MRN: [id number] Date of Birth: October 14, 1945

## 2018-08-30 ENCOUNTER — Ambulatory Visit: Payer: Medicare Other | Admitting: Physical Therapy

## 2018-08-30 ENCOUNTER — Encounter: Payer: Self-pay | Admitting: Physical Therapy

## 2018-08-30 ENCOUNTER — Other Ambulatory Visit: Payer: Self-pay

## 2018-08-30 DIAGNOSIS — M79605 Pain in left leg: Secondary | ICD-10-CM

## 2018-08-30 DIAGNOSIS — R262 Difficulty in walking, not elsewhere classified: Secondary | ICD-10-CM

## 2018-08-30 DIAGNOSIS — M6281 Muscle weakness (generalized): Secondary | ICD-10-CM

## 2018-08-30 DIAGNOSIS — R293 Abnormal posture: Secondary | ICD-10-CM

## 2018-08-30 NOTE — Therapy (Signed)
[facility] [address] [place], [place], [address] Phone: [phone]   Fax:  [phone]  Physical Therapy Treatment  Patient Details  Name: [name] MRN: [id number] Date of Birth: 1945-04-19 Referring Provider (PT): Letta Pate Luanna Salk, MD   Encounter Date: 08/30/2018  PT End of Session - 08/30/18 1652    Visit Number  19    Date for PT Re-Evaluation  09/30/18    PT Start Time  1345    PT Stop Time  1430    PT Time Calculation (min)  45 min    Activity Tolerance  Patient tolerated treatment well    Behavior During Therapy  [facility] for tasks assessed/performed       Past Medical History:  Diagnosis Date  . Congenital musculoskeletal deformity of spine   . Hypertension   . Incontinence of bowel 09/2013  . Lumbago   . Lumbosacral spondylosis without myelopathy   . Sleep disorder    uses gabapentin at bedtime   . Spinal stenosis, lumbar region, without neurogenic claudication     Past Surgical History:  Procedure Laterality Date  . ANKLE SURGERY Right 2015   lengthened the achilles   . AUGMENTATION MAMMAPLASTY Bilateral 2000 or 2001 unsure    Left Implant has busted and is no longer visible  . BACK SURGERY  2015   fusion , Dr Patrice Paradise at [facility]; has 2 metal rods in place , fusion from t2 to s1   . BACK SURGERY  01/2017   Dr Rennis Harding ; repair of cracked titanium rod in back lumbar   . bunionectomy  Bilateral 1980s   multiple   . COLONOSCOPY     with polypectomy ; q 5 years   . FOOT SURGERY  07/2011  . SHOULDER SURGERY Left 2013   rotator cuff   . TOTAL HIP ARTHROPLASTY Right 05/08/2017   Procedure: RIGHT TOTAL HIP ARTHROPLASTY ANTERIOR APPROACH;  Surgeon: Paralee Cancel, MD;  Location: [facility];  Service: Orthopedics;  Laterality: Right;  70 mins    There were no vitals filed for this visit.  Subjective Assessment - 08/30/18 1651    Subjective  I did not walk this AM because I knew we  would walk in the water. A little sore after Wednesday but not as bad as last week.    Limitations  Walking    Currently in Pain?  No/denies    Multiple Pain Sites  No      Aquatic session: Water temp; 84.7 degrees F Pt entered and exited the pool via long ramp with bil hand rails.   Seated: Ankle circles, PF/DF, knee flex/ext all 10x while we discussed her current status.  Water walking: waist deep to just below the chest ( as she walked from 3 ft to 4 ft) 4 lengths in each direction. Pt was able to generate some current today due to increased speed which facilitated more resistance.   Submerged UE water weights in standing: shoulder flex/ext 10x, shoulder abd/add 10x, breast stroke arm series 15x the reverse direction 15x. VC to contract her core.  Rt lateral trunk stretch to tolerance supported LTUE on kickboard 5x. VC for move trunk not arm. Discomfort.  End 5 min decompression float with noodle.                           PT Short Term Goals - 08/02/18 1455  PT SHORT TERM GOAL #1   Title  ind with initial HEP    Status  Achieved      PT SHORT TERM GOAL #2   Title  Pt will report 25% less hip pain at night    Baseline  40% less pain    Status  Achieved        PT Long Term Goals - 08/09/18 1334      PT LONG TERM GOAL #1   Title  pt will be independent with HEP and consistently doing it on her own at least 5x/week    Status  On-going      PT LONG TERM GOAL #2   Title  Pt will be able to demonstrate hip extension ROM to neutral for improved gait and upright posture    Status  On-going      PT LONG TERM GOAL #3   Title  pt will report increased walking tolerance to >32mnutes without increased pain    Baseline  able to walk 1/2 mile, 10 minutes    Status  Partially Met      PT LONG TERM GOAL #4   Title  pt will decrease FOTO to 47% or less    Status  On-going            Plan - 08/30/18 1652    Clinical Impression Statement  Pt  arrives for aquatic therapy today tired as theaquatic center changed their parking due to front lobby renovation. Pt does not have a handicap sticker for her car so she was reluctant to use the handicap spot which would have gotten her closer to where we had to enter the pool today. Pt was able to demonstrate improved walking endurance as she could walk 4 lengths in each direction before needing to stop due to fatigue. Pt also was able to use the water weights for her ue to work on her postural/trunk strength without any pain complaints. Pt has discomfort stretching her Rt lateral trunk.    Personal Factors and Comorbidities  Comorbidity 2    Comorbidities  back surgery, THA, sleep dysfunction    Examination-Activity Limitations  Bed Mobility;Locomotion Level;Sleep    Stability/Clinical Decision Making  Evolving/Moderate complexity    PT Frequency  3x / week    PT Duration  12 weeks    PT Treatment/Interventions  ADLs/Self Care Home Management;Biofeedback;Cryotherapy;Electrical Stimulation;Iontophoresis 429mml Dexamethasone;Moist Heat;Gait training;Stair training;Functional mobility training;Therapeutic activities;Therapeutic exercise;Neuromuscular re-education;Patient/family education;Manual techniques;Dry needling;Passive range of motion;Taping;Scar mobilization;Aquatic Therapy    PT Next Visit Plan  Trunk strength, review goals, hip strength    PT Home Exercise Plan  Access Code: [email or web address]  Consulted and Agree with Plan of Care  Patient       Patient will benefit from skilled therapeutic intervention in order to improve the following deficits and impairments:  Abnormal gait, Difficulty walking, Impaired flexibility, Decreased strength, Decreased range of motion, Pain, Postural dysfunction  Visit Diagnosis: Pain in left leg  Abnormal posture  Difficulty in walking, not elsewhere classified  Muscle weakness (generalized)     Problem List Patient Active Problem List   Diagnosis Date  Noted  . Hip contracture, unspecified laterality 07/02/2018  . Overweight (BMI 25.0-29.9) 05/09/2017  . S/P right THA, AA 05/08/2017  . Chronic bilateral low back pain without sciatica 04/05/2015  . Disorder of sacroiliac joint 12/22/2014  . Chronic low back pain 12/22/2014  . Acquired scoliosis 11/27/2011  . Lumbosacral spondylosis without myelopathy 05/22/2011    Ahyana Skillin,  PTA 08/30/2018, 4:58 PM  [facility] [address], [place], [place], [address] Phone: [phone]   Fax:  [phone]  Name: [name] MRN: [id number] Date of Birth: 06-11-1945

## 2018-09-04 ENCOUNTER — Encounter: Payer: Self-pay | Admitting: Physical Therapy

## 2018-09-04 ENCOUNTER — Other Ambulatory Visit: Payer: Self-pay

## 2018-09-04 ENCOUNTER — Ambulatory Visit: Payer: Medicare Other | Admitting: Physical Therapy

## 2018-09-04 DIAGNOSIS — M79605 Pain in left leg: Secondary | ICD-10-CM

## 2018-09-04 DIAGNOSIS — M6281 Muscle weakness (generalized): Secondary | ICD-10-CM

## 2018-09-04 DIAGNOSIS — R293 Abnormal posture: Secondary | ICD-10-CM

## 2018-09-04 DIAGNOSIS — R262 Difficulty in walking, not elsewhere classified: Secondary | ICD-10-CM

## 2018-09-04 NOTE — Therapy (Addendum)
[facility] [address] [place], [place], [address] Phone: [phone]   Fax:  [phone]  Physical Therapy Treatment Progress Note Reporting Period 08/02/18 to 09/04/18   See note below for Objective Data and Assessment of Progress/Goals.   Gustavus Bryant, PT 09/04/18 5:21 PM    Patient Details  Name: [name] MRN: [id number] Date of Birth: Dec 18, 1945 Referring Provider (PT): Letta Pate Luanna Salk, MD   Encounter Date: 09/04/2018  PT End of Session - 09/04/18 1433    Visit Number  20    Date for PT Re-Evaluation  09/30/18    PT Start Time  1433    PT Stop Time  1511    PT Time Calculation (min)  38 min    Activity Tolerance  Patient tolerated treatment well    Behavior During Therapy  [facility] for tasks assessed/performed       Past Medical History:  Diagnosis Date  . Congenital musculoskeletal deformity of spine   . Hypertension   . Incontinence of bowel 09/2013  . Lumbago   . Lumbosacral spondylosis without myelopathy   . Sleep disorder    uses gabapentin at bedtime   . Spinal stenosis, lumbar region, without neurogenic claudication     Past Surgical History:  Procedure Laterality Date  . ANKLE SURGERY Right 2015   lengthened the achilles   . AUGMENTATION MAMMAPLASTY Bilateral 2000 or 2001 unsure    Left Implant has busted and is no longer visible  . BACK SURGERY  2015   fusion , Dr Patrice Paradise at [facility]; has 2 metal rods in place , fusion from t2 to s1   . BACK SURGERY  01/2017   Dr Rennis Harding ; repair of cracked titanium rod in back lumbar   . bunionectomy  Bilateral 1980s   multiple   . COLONOSCOPY     with polypectomy ; q 5 years   . FOOT SURGERY  07/2011  . SHOULDER SURGERY Left 2013   rotator cuff   . TOTAL HIP ARTHROPLASTY Right 05/08/2017   Procedure: RIGHT TOTAL HIP ARTHROPLASTY ANTERIOR APPROACH;  Surgeon: Paralee Cancel, MD;  Location: [facility];  Service: Orthopedics;   Laterality: Right;  70 mins    There were no vitals filed for this visit.      [facility] PT Assessment - 09/04/18 0001      Assessment   Medical Diagnosis  M24.559 (ICD-10-CM) - Hip flexor tightness, unspecified laterality    Referring Provider (PT)  Kirsteins, Luanna Salk, MD      Observation/Other Assessments   Focus on Therapeutic Outcomes (FOTO)   45% limited      Strength   Left Hip Flexion  5/5    Left Hip External Rotation  5/5    Left Hip ABduction  4+/5                   [facility] Adult PT Treatment/Exercise - 09/04/18 0001      Lumbar Exercises: Aerobic   Nustep  L3 x 10 min PTA present to discuss status      Lumbar Exercises: Supine   Bridge with Ball Squeeze  10 reps;3 seconds      Knee/Hip Exercises: Sidelying   Hip ABduction  Strengthening;Left;2 sets;10 reps    Hip ABduction Limitations  Pt able to do with full knee extension and no substititions    Clams  green band 2x10  PT Short Term Goals - 09/04/18 1448      PT SHORT TERM GOAL #1   Title  ind with initial HEP    Time  2    Period  Weeks    Status  Achieved    Target Date  07/22/18      PT SHORT TERM GOAL #2   Title  Pt will report 25% less hip pain at night    Baseline  Abolished    Time  6    Period  Weeks    Status  Achieved        PT Long Term Goals - 09/04/18 1448      PT LONG TERM GOAL #1   Title  pt will be independent with HEP and consistently doing it on her own at least 5x/week    Time  12    Period  Weeks    Status  On-going      PT LONG TERM GOAL #3   Title  pt will report increased walking tolerance to >75minutes without increased pain    Baseline  isn't sure how far, but she is walking 20 min now.    Time  12    Period  Weeks    Status  Achieved            Plan - 09/04/18 1434    Clinical Impression Statement  Pt  hip strength improved since last measurment, FOTO improved, as well as meeting all STG and some long term. Her main goal  at this time is to walk 1 mile with greater ease and to eventually walk longer community distances without her cane.    Personal Factors and Comorbidities  Comorbidity 2    Comorbidities  back surgery, THA, sleep dysfunction    Examination-Activity Limitations  Bed Mobility;Locomotion Level;Sleep    Stability/Clinical Decision Making  Evolving/Moderate complexity    Rehab Potential  Excellent    PT Frequency  3x / week    PT Duration  12 weeks    PT Treatment/Interventions  ADLs/Self Care Home Management;Biofeedback;Cryotherapy;Electrical Stimulation;Iontophoresis 4mg /ml Dexamethasone;Moist Heat;Gait training;Stair training;Functional mobility training;Therapeutic activities;Therapeutic exercise;Neuromuscular re-education;Patient/family education;Manual techniques;Dry needling;Passive range of motion;Taping;Scar mobilization;Aquatic Therapy    PT Next Visit Plan  Aquatics next session, 20th MD note sent    PT Home Exercise Plan  Access Code: [email or web address]    Consulted and Agree with Plan of Care  Patient       Patient will benefit from skilled therapeutic intervention in order to improve the following deficits and impairments:  Abnormal gait, Difficulty walking, Impaired flexibility, Decreased strength, Decreased range of motion, Pain, Postural dysfunction  Visit Diagnosis: Pain in left leg  Abnormal posture  Difficulty in walking, not elsewhere classified  Muscle weakness (generalized)     Problem List Patient Active Problem List   Diagnosis Date Noted  . Hip contracture, unspecified laterality 07/02/2018  . Overweight (BMI 25.0-29.9) 05/09/2017  . S/P right THA, AA 05/08/2017  . Chronic bilateral low back pain without sciatica 04/05/2015  . Disorder of sacroiliac joint 12/22/2014  . Chronic low back pain 12/22/2014  . Acquired scoliosis 11/27/2011  . Lumbosacral spondylosis without myelopathy 05/22/2011    [name], PTA 09/04/18 3:12 PM  [facility] [address], [place], [place], [address] Phone: [phone]   Fax:  [phone]  Name: [name] MRN: [id number] Date of Birth: August 30, 1945

## 2018-09-06 ENCOUNTER — Encounter: Payer: Self-pay | Admitting: Physical Therapy

## 2018-09-06 ENCOUNTER — Ambulatory Visit: Payer: Medicare Other | Admitting: Physical Therapy

## 2018-09-06 ENCOUNTER — Other Ambulatory Visit: Payer: Self-pay

## 2018-09-06 DIAGNOSIS — M79605 Pain in left leg: Secondary | ICD-10-CM | POA: Diagnosis not present

## 2018-09-06 DIAGNOSIS — M6281 Muscle weakness (generalized): Secondary | ICD-10-CM

## 2018-09-06 DIAGNOSIS — R293 Abnormal posture: Secondary | ICD-10-CM

## 2018-09-06 DIAGNOSIS — R262 Difficulty in walking, not elsewhere classified: Secondary | ICD-10-CM

## 2018-09-06 NOTE — Therapy (Signed)
[facility] [address], [place], [place], [address] Phone: [phone]   Fax:  [phone]  Physical Therapy Treatment  Patient Details  Name: [name] MRN: [id number] Date of Birth: March 30, 1945 Referring Provider (PT): Letta Pate Luanna Salk, MD   Encounter Date: 09/06/2018  PT End of Session - 09/06/18 1558    Visit Number  21    Date for PT Re-Evaluation  09/30/18    PT Start Time  1330    PT Stop Time  1420    PT Time Calculation (min)  50 min    Activity Tolerance  Patient tolerated treatment well    Behavior During Therapy  [facility] for tasks assessed/performed       Past Medical History:  Diagnosis Date  . Congenital musculoskeletal deformity of spine   . Hypertension   . Incontinence of bowel 09/2013  . Lumbago   . Lumbosacral spondylosis without myelopathy   . Sleep disorder    uses gabapentin at bedtime   . Spinal stenosis, lumbar region, without neurogenic claudication     Past Surgical History:  Procedure Laterality Date  . ANKLE SURGERY Right 2015   lengthened the achilles   . AUGMENTATION MAMMAPLASTY Bilateral 2000 or 2001 unsure    Left Implant has busted and is no longer visible  . BACK SURGERY  2015   fusion , Dr Patrice Paradise at [facility]; has 2 metal rods in place , fusion from t2 to s1   . BACK SURGERY  01/2017   Dr Rennis Harding ; repair of cracked titanium rod in back lumbar   . bunionectomy  Bilateral 1980s   multiple   . COLONOSCOPY     with polypectomy ; q 5 years   . FOOT SURGERY  07/2011  . SHOULDER SURGERY Left 2013   rotator cuff   . TOTAL HIP ARTHROPLASTY Right 05/08/2017   Procedure: RIGHT TOTAL HIP ARTHROPLASTY ANTERIOR APPROACH;  Surgeon: Paralee Cancel, MD;  Location: [facility];  Service: Orthopedics;  Laterality: Right;  70 mins    There were no vitals filed for this visit.  Subjective Assessment - 09/06/18 1607    Subjective  I hurt my back bending over to pick up a  napkin this afternoon. My left side hurts. I did good walking yesterday, was able to go a little farther. My balance when I put my pants on is not good.    Currently in Pain?  Yes    Pain Score  4     Pain Location  Back    Pain Orientation  Left    Pain Descriptors / Indicators  Sharp;Shooting    Pain Relieving Factors  Resting, doing my exercises especially in the pool    Multiple Pain Sites  No        Treatment: Aquatic session: water termp 84.7 degrees F Pt entered the pool and exited via the long ramp using both hand rails.   Sitting on bench for submersion up to upper chest. Discussed pain while warm jets hit her back. This felt good to her back. Ankle circles 20x during communication Bil, knee active flex/extension with active push against the water 10x, and seated LT hamstring stretch 3x 20 sec. PTA demo stretch with verbal instruction.   Water walking: only 2 lengths in each direction to honor her pain. Pain did not increaease.   Decompression float x 1 min to give back traction with 1 large pool noodle.  Standing hip abduction  bil alternating, pt had hard time with balance today. Added a noodle with vC to contract core and buttocks more. Hip circles 10x Bil holding onto 2 noodles. VC for core and gluteal contraction   Water walking forward and backward 1 length using the noodle to push and pull in the water ( full submersion) for upper back/postural strength.   Bicycle x 5 min with noodle behind pt.                          PT Short Term Goals - 09/04/18 1448      PT SHORT TERM GOAL #1   Title  ind with initial HEP    Time  2    Period  Weeks    Status  Achieved    Target Date  07/22/18      PT SHORT TERM GOAL #2   Title  Pt will report 25% less hip pain at night    Baseline  Abolished    Time  6    Period  Weeks    Status  Achieved        PT Long Term Goals - 09/04/18 1448      PT LONG TERM GOAL #1   Title  pt will be independent with  HEP and consistently doing it on her own at least 5x/week    Time  12    Period  Weeks    Status  On-going      PT LONG TERM GOAL #3   Title  pt will report increased walking tolerance to >65minutes without increased pain    Baseline  isn't sure how far, but she is walking 20 min now.    Time  12    Period  Weeks    Status  Achieved            Plan - 09/06/18 1559    Clinical Impression Statement  Pt bent over to pick up a napkin this afternoon and shereports feeling a very sharp pain along her LT low back into her thigh. This pain seemed to have abolishedclose to 90% after her aquatic session today. Pt was educated in avoiding unsupported forward bending and taught how to utilize the golfers lift more often. She agreed to try this. Pt was challenged in the pool whenever she had to perform an exercise in single leg stance. Even with the support of 2 pool noodles for extra buoyancy this was tough.Pt was able to add extra resistance during her water walking today using the noodle for added resistance against the water. Patient demonstrates less Rt lean when she ambulates in the pool.    Personal Factors and Comorbidities  Comorbidity 2    Comorbidities  back surgery, THA, sleep dysfunction    Examination-Activity Limitations  Bed Mobility;Locomotion Level;Sleep    Stability/Clinical Decision Making  Evolving/Moderate complexity    Rehab Potential  Excellent    PT Frequency  3x / week    PT Duration  12 weeks    PT Treatment/Interventions  ADLs/Self Care Home Management;Biofeedback;Cryotherapy;Electrical Stimulation;Iontophoresis 4mg /ml Dexamethasone;Moist Heat;Gait training;Stair training;Functional mobility training;Therapeutic activities;Therapeutic exercise;Neuromuscular re-education;Patient/family education;Manual techniques;Dry needling;Passive range of motion;Taping;Scar mobilization;Aquatic Therapy    PT Next Visit Plan  Trunk and core strengh on land next session.    PT Home  Exercise Plan  Access Code: [email or web address]    Consulted and Agree with Plan of Care  Patient       Patient will benefit from  skilled therapeutic intervention in order to improve the following deficits and impairments:  Abnormal gait, Difficulty walking, Impaired flexibility, Decreased strength, Decreased range of motion, Pain, Postural dysfunction  Visit Diagnosis: Pain in left leg  Abnormal posture  Difficulty in walking, not elsewhere classified  Muscle weakness (generalized)     Problem List Patient Active Problem List   Diagnosis Date Noted  . Hip contracture, unspecified laterality 07/02/2018  . Overweight (BMI 25.0-29.9) 05/09/2017  . S/P right THA, AA 05/08/2017  . Chronic bilateral low back pain without sciatica 04/05/2015  . Disorder of sacroiliac joint 12/22/2014  . Chronic low back pain 12/22/2014  . Acquired scoliosis 11/27/2011  . Lumbosacral spondylosis without myelopathy 05/22/2011    COCHRAN,JENNIFER, PTA 09/06/2018, 4:09 PM  [facility] [address] [place], [place], [address] Phone: [phone]   Fax:  [phone]  Name: [name] MRN: [id number] Date of Birth: 03/27/1945

## 2018-09-09 ENCOUNTER — Other Ambulatory Visit: Payer: Self-pay

## 2018-09-09 ENCOUNTER — Encounter: Payer: Self-pay | Admitting: Physical Medicine & Rehabilitation

## 2018-09-09 ENCOUNTER — Encounter: Payer: Medicare Other | Attending: Physical Medicine & Rehabilitation | Admitting: Physical Medicine & Rehabilitation

## 2018-09-09 VITALS — BP 124/83 | HR 69 | Temp 97.6°F | Ht 64.0 in | Wt 196.0 lb

## 2018-09-09 DIAGNOSIS — Z96641 Presence of right artificial hip joint: Secondary | ICD-10-CM | POA: Diagnosis not present

## 2018-09-09 DIAGNOSIS — M961 Postlaminectomy syndrome, not elsewhere classified: Secondary | ICD-10-CM | POA: Insufficient documentation

## 2018-09-09 DIAGNOSIS — M419 Scoliosis, unspecified: Secondary | ICD-10-CM

## 2018-09-09 DIAGNOSIS — M24559 Contracture, unspecified hip: Secondary | ICD-10-CM | POA: Insufficient documentation

## 2018-09-09 NOTE — Progress Notes (Signed)
Subjective:    Patient ID: [name], female    DOB: [birth year], 73 y.o.   MRN: [id number]  HPI  72 year old female with history of thoracolumbar scoliosis who underwent a T9-S1 fusion per Dr. Rennis Harding with complication of hardware failure requiring hardware augmentation performed February 07, 2017.  In addition patient had total hip replacement 3/8' lift on RIght side this is a built-up sole.  Patient feels like this is doing little bit better Overall PT is great  HomeEx program inconsistent Hip abductor exercose  Pain Inventory Average Pain 1 Pain Right Now 2 My pain is burning and aching  In the last 24 hours, has pain interfered with the following? General activity 2 Relation with others 4 Enjoyment of life 4 What TIME of day is your pain at its worst? night Sleep (in general) Fair  Pain is worse with: walking and standing Pain improves with: rest and heat/ice Relief from Meds: 5  Mobility use a cane ability to climb steps?  yes do you drive?  yes  Function retired  Neuro/Psych weakness trouble walking  Prior Studies Any changes since last visit?  no  Physicians involved in your care Any changes since last visit?  no   Family History  Problem Relation Age of Onset  . Dementia Mother   . Stroke Mother   . Depression Mother   . COPD Father   . Cancer Brother   . Breast cancer Neg Hx    Social History   Socioeconomic History  . Marital status: Married    Spouse name: Not on file  . Number of children: Not on file  . Years of education: Not on file  . Highest education level: Not on file  Occupational History  . Not on file  Social Needs  . Financial resource strain: Not on file  . Food insecurity    Worry: Not on file    Inability: Not on file  . Transportation needs    Medical: Not on file    Non-medical: Not on file  Tobacco Use  . Smoking status: Former Research scientist (life sciences)  . Smokeless tobacco: Never Used  . Tobacco comment: smoked for ayear  and half in college   Substance and Sexual Activity  . Alcohol use: Yes    Alcohol/week: 3.0 standard drinks    Types: 3 Glasses of wine per week  . Drug use: Not on file  . Sexual activity: Not on file  Lifestyle  . Physical activity    Days per week: Not on file    Minutes per session: Not on file  . Stress: Not on file  Relationships  . Social Herbalist on phone: Not on file    Gets together: Not on file    Attends religious service: Not on file    Active member of club or organization: Not on file    Attends meetings of clubs or organizations: Not on file    Relationship status: Not on file  Other Topics Concern  . Not on file  Social History Narrative  . Not on file   Past Surgical History:  Procedure Laterality Date  . ANKLE SURGERY Right 2015   lengthened the achilles   . AUGMENTATION MAMMAPLASTY Bilateral 2000 or 2001 unsure    Left Implant has busted and is no longer visible  . BACK SURGERY  2015   fusion , Dr Patrice Paradise at [facility]; has 2 metal rods in place , fusion from  t2 to s1   . BACK SURGERY  01/2017   Dr Rennis Harding ; repair of cracked titanium rod in back lumbar   . bunionectomy  Bilateral 1980s   multiple   . COLONOSCOPY     with polypectomy ; q 5 years   . FOOT SURGERY  07/2011  . SHOULDER SURGERY Left 2013   rotator cuff   . TOTAL HIP ARTHROPLASTY Right 05/08/2017   Procedure: RIGHT TOTAL HIP ARTHROPLASTY ANTERIOR APPROACH;  Surgeon: Paralee Cancel, MD;  Location: [facility];  Service: Orthopedics;  Laterality: Right;  70 mins   Past Medical History:  Diagnosis Date  . Congenital musculoskeletal deformity of spine   . Hypertension   . Incontinence of bowel 09/2013  . Lumbago   . Lumbosacral spondylosis without myelopathy   . Sleep disorder    uses gabapentin at bedtime   . Spinal stenosis, lumbar region, without neurogenic claudication    BP 124/83   Pulse 69   Temp 97.6 F (36.4 C)   Ht 5\' 4"  (1.626 m)   Wt 196 lb (88.9 kg)    SpO2 94%   BMI 33.64 kg/m   Opioid Risk Score:   Fall Risk Score:  `1  Depression screen PHQ 2/9  Depression screen [facility] 2/9 05/21/2018 02/23/2015 02/16/2015 02/08/2015 01/22/2015  Decreased Interest 1 0 0 1 -  Down, Depressed, Hopeless 1 0 0 1 1  PHQ - 2 Score 2 0 0 2 1  Altered sleeping 1 0 2 - 1  Tired, decreased energy 1 0 2 - 1  Change in appetite 0 0 0 - 0  Feeling bad or failure about yourself  1 0 0 - 0  Trouble concentrating 1 0 0 - 1  Moving slowly or fidgety/restless 0 0 0 - 0  Suicidal thoughts 0 0 0 - 0  PHQ-9 Score 6 0 4 - 4  Difficult doing work/chores Somewhat difficult - Very difficult - Somewhat difficult     Review of Systems  Constitutional: Positive for unexpected weight change.  HENT: Negative.   Eyes: Negative.   Respiratory: Negative.   Cardiovascular: Negative.   Gastrointestinal: Negative.   Endocrine: Negative.   Genitourinary: Negative.   Musculoskeletal: Positive for arthralgias, back pain, gait problem and myalgias.  Skin: Negative.   Allergic/Immunologic: Negative.   Neurological: Positive for weakness.  Hematological: Negative.   Psychiatric/Behavioral: Negative.   All other systems reviewed and are negative.      Objective:   Physical Exam Vitals signs and nursing note reviewed.  Constitutional:      Appearance: Normal appearance.  Skin:    General: Skin is warm and dry.  Neurological:     Mental Status: She is alert.    Weakness with hip abduction at posture is forward flexed and bends toward the right. 3- strength bilateral hip abductors.  4/5 bilateral ankle dorsiflexors, 4/5 bilateral knee extensors      Assessment & Plan:  1.  History of lumbar scoliosis and radiculopathy she has residual hip abductor weakness bilaterally. In addition she has pelvic obliquity and functional leg length discrepancy of approximately 5/8 inch.  I do think she would benefit from additional 1/4  inch right shoe buildup She has goals of  ambulating 1 mile She also has goal of ambulating without a cane. She is making slow progress toward these goals and I think she would benefit from additional rehabilitation We will check her progress at next visit in 2 months.  We may  consider EMG/NCV if she is failing to make any further gains as she may have chronic radiculopathy

## 2018-09-09 NOTE — Patient Instructions (Addendum)
Y-T-W Exercise 10 reps after sewing 1 hour  Please do home exercises daily  Please call if orthotist needs a new prescription for shoe lift

## 2018-09-11 ENCOUNTER — Other Ambulatory Visit: Payer: Self-pay

## 2018-09-11 ENCOUNTER — Encounter: Payer: Self-pay | Admitting: Physical Therapy

## 2018-09-11 ENCOUNTER — Ambulatory Visit: Payer: Medicare Other | Attending: Physical Medicine & Rehabilitation | Admitting: Physical Therapy

## 2018-09-11 DIAGNOSIS — M6281 Muscle weakness (generalized): Secondary | ICD-10-CM | POA: Diagnosis present

## 2018-09-11 DIAGNOSIS — R262 Difficulty in walking, not elsewhere classified: Secondary | ICD-10-CM | POA: Insufficient documentation

## 2018-09-11 DIAGNOSIS — R293 Abnormal posture: Secondary | ICD-10-CM | POA: Diagnosis present

## 2018-09-11 DIAGNOSIS — M79605 Pain in left leg: Secondary | ICD-10-CM

## 2018-09-11 NOTE — Therapy (Signed)
[facility] [address], [place], [place], [address] Phone: [phone]   Fax:  [phone]  Physical Therapy Treatment  Patient Details  Name: [name] MRN: [id number] Date of Birth: Jul 10, 1945 Referring Provider (PT): Letta Pate Luanna Salk, MD   Encounter Date: 09/11/2018  PT End of Session - 09/11/18 1405    Visit Number  [id number]    Date for PT Re-Evaluation  09/30/18    PT Start Time  [id number]    PT Stop Time  1503    PT Time Calculation (min)  60 min    Activity Tolerance  Patient tolerated treatment well    Behavior During Therapy  [facility] for tasks assessed/performed       Past Medical History:  Diagnosis Date  . Congenital musculoskeletal deformity of spine   . Hypertension   . Incontinence of bowel 09/2013  . Lumbago   . Lumbosacral spondylosis without myelopathy   . Sleep disorder    uses gabapentin at bedtime   . Spinal stenosis, lumbar region, without neurogenic claudication     Past Surgical History:  Procedure Laterality Date  . ANKLE SURGERY Right 2015   lengthened the achilles   . AUGMENTATION MAMMAPLASTY Bilateral 2000 or 2001 unsure    Left Implant has busted and is no longer visible  . BACK SURGERY  2015   fusion , Dr Patrice Paradise at [facility]; has 2 metal rods in place , fusion from t2 to s1   . BACK SURGERY  01/2017   Dr Rennis Harding ; repair of cracked titanium rod in back lumbar   . bunionectomy  Bilateral 1980s   multiple   . COLONOSCOPY     with polypectomy ; q 5 years   . FOOT SURGERY  07/2011  . SHOULDER SURGERY Left 2013   rotator cuff   . TOTAL HIP ARTHROPLASTY Right 05/08/2017   Procedure: RIGHT TOTAL HIP ARTHROPLASTY ANTERIOR APPROACH;  Surgeon: Paralee Cancel, MD;  Location: [facility];  Service: Orthopedics;  Laterality: Right;  70 mins    There were no vitals filed for this visit.  Subjective Assessment - 09/11/18 1408    Subjective  My back pain abolished each day and was  totally gone by Monday. I am paying attention more to how I bend. Saw the MD, went well. I am going to look into getting a bigger lift in my Rt shoe. Walked this AM and yesterday. My MD wants me to do another month of PT.    Currently in Pain?  No/denies    Multiple Pain Sites  No                       [facility] Adult PT Treatment/Exercise - 09/11/18 0001      Lumbar Exercises: Stretches   Lower Trunk Rotation  2 reps;20 seconds   To the Lt stretching the RT side   Standing Side Bend  Right;3 reps    Standing Side Bend Limitations  green ball on the mat      Lumbar Exercises: Aerobic   UBE (Upper Arm Bike)  #11 reverse L1.5 with towel under Rt buttocks and lumbar support. 4 min    Nustep  L4 x 15 min Discussion of MD appt, continuted idea of pacing herself, how she did after the pool.       Lumbar Exercises: Seated   Other Seated Lumbar Exercises  Yellow band Multifidus activation 2x6 5 sec hold  Added to HEP     Lumbar Exercises: Supine   Other Supine Lumbar Exercises  Multifidus heel push into towel 10x 5 sec hold Bil   Added to HEP     Knee/Hip Exercises: Sidelying   Hip ABduction  Strengthening;Left;2 sets;10 reps    Hip ABduction Limitations  Very little palpable muscle contraction: pt reports if she doesn't do this daily she looses the ability to lift her leg.              PT Education - 09/11/18 1438    Education Details  Seated and supine multifidus strengthening for HEP progression    Person(s) Educated  Patient    Methods  Explanation;Demonstration;Tactile cues;Verbal cues;Handout    Comprehension  Returned demonstration;Verbalized understanding       PT Short Term Goals - 09/04/18 1448      PT SHORT TERM GOAL #1   Title  ind with initial HEP    Time  2    Period  Weeks    Status  Achieved    Target Date  07/22/18      PT SHORT TERM GOAL #2   Title  Pt will report 25% less hip pain at night    Baseline  Abolished    Time  6    Period   Weeks    Status  Achieved        PT Long Term Goals - 09/11/18 1508      PT LONG TERM GOAL #1   Title  pt will be independent with HEP and consistently doing it on her own at least 5x/week    Time  12    Period  Weeks    Status  On-going            Plan - 09/11/18 1406    Clinical Impression Statement  Pt's flare up of back pain reported last Friday abolished by the end of the weekend. She has recently seen the MD who was pleased with her progress and has encouraged her to continue with her increased activity and to do 1 more month of PT.    Personal Factors and Comorbidities  Comorbidity 2    Comorbidities  back surgery, THA, sleep dysfunction    Examination-Activity Limitations  Bed Mobility;Locomotion Level;Sleep    Stability/Clinical Decision Making  Evolving/Moderate complexity    Rehab Potential  Excellent    PT Frequency  3x / week    PT Duration  12 weeks    PT Treatment/Interventions  ADLs/Self Care Home Management;Biofeedback;Cryotherapy;Electrical Stimulation;Iontophoresis 4mg /ml Dexamethasone;Moist Heat;Gait training;Stair training;Functional mobility training;Therapeutic activities;Therapeutic exercise;Neuromuscular re-education;Patient/family education;Manual techniques;Dry needling;Passive range of motion;Taping;Scar mobilization;Aquatic Therapy    PT Next Visit Plan  Aquatics next; re-evaluation on 9/21. pt would like to add balance to her program.    PT Home Exercise Plan  Access Code: [email or web address]    Consulted and Agree with Plan of Care  Patient       Patient will benefit from skilled therapeutic intervention in order to improve the following deficits and impairments:  Abnormal gait, Difficulty walking, Impaired flexibility, Decreased strength, Decreased range of motion, Pain, Postural dysfunction  Visit Diagnosis: Pain in left leg  Abnormal posture  Difficulty in walking, not elsewhere classified  Muscle weakness (generalized)     Problem  List Patient Active Problem List   Diagnosis Date Noted  . Hip contracture, unspecified laterality 07/02/2018  . Overweight (BMI 25.0-29.9) 05/09/2017  . S/P right THA, AA 05/08/2017  . Chronic bilateral low  back pain without sciatica 04/05/2015  . Disorder of sacroiliac joint 12/22/2014  . Chronic low back pain 12/22/2014  . Acquired scoliosis 11/27/2011  . Lumbosacral spondylosis without myelopathy 05/22/2011    Kendyn Zaman, PTA 09/11/2018, 3:12 PM  [facility] [address] [place], [place], [address] Phone: [phone]   Fax:  [phone]  Name: Kylah Puricelli MRN: [id number] Date of Birth: July 17, 1945  Access Code: [email or web address]  URL: [email or web address]  Date: 09/11/2018  Prepared by: [name]   Exercises  Supine Single Knee to Chest Stretch - 3 reps - 1 sets - 15 hold - 3x daily - 7x weekly  Hip Flexor Stretch with Chair - 3 reps - 1 sets - 15 hold - 2x daily - 7x weekly  Supine Bridge - 3 reps - 1 sets - 2 hold - 2x daily - 7x weekly  Standing Hip Extension - 10 reps - 1 sets - 2x daily - 7x weekly  Hooklying Isometric Clamshell - 10 reps - 1 sets - 2x daily - 7x weekly  Seated Sidebending - 2 reps - 1 sets - 10 hold - 3x daily - 7x weekly  Sidelying Bent Knee Lift at 45 Degrees - 6 reps - 1 sets - 2x daily - 7x weekly  Supine Shoulder Horizontal Abduction with Resistance - 10 reps - 2 sets - 1x daily - 7x weekly  Seated Shoulder Diagonal with Resistance - 10 reps - 2 sets - 1x daily - 7x weekly  Supine Piriformis Stretch with Foot on Ground - 3 reps - 1 sets - 30 hold - 2x daily - 7x weekly  Shoulder extension with resistance - Neutral - 10 reps - 2 sets - 1x daily - 7x weekly  Supine Dead Bug with Leg Extension - 20 reps - 2 sets - 2x daily - 7x weekly  Bilateral Bent Leg Lift - 10 reps - 1 sets - 2x daily - 7x weekly  Supine Multifidus with Heel Press - Leg Bent - 10 reps - 1 sets  - 5 hold - 2x daily - 7x weekly

## 2018-09-13 ENCOUNTER — Ambulatory Visit: Payer: Medicare Other | Admitting: Physical Therapy

## 2018-09-13 ENCOUNTER — Other Ambulatory Visit: Payer: Self-pay

## 2018-09-13 DIAGNOSIS — R293 Abnormal posture: Secondary | ICD-10-CM

## 2018-09-13 DIAGNOSIS — M6281 Muscle weakness (generalized): Secondary | ICD-10-CM

## 2018-09-13 DIAGNOSIS — R262 Difficulty in walking, not elsewhere classified: Secondary | ICD-10-CM

## 2018-09-13 DIAGNOSIS — M79605 Pain in left leg: Secondary | ICD-10-CM

## 2018-09-13 NOTE — Therapy (Signed)
[facility] [address] [place], [place], [address] Phone: [phone]   Fax:  [phone]  Physical Therapy Treatment  Patient Details  Name: [name] MRN: [id number] Date of Birth: 08-Jan-1946 Referring Provider (PT): Letta Pate Luanna Salk, MD   Encounter Date: 09/13/2018  PT End of Session - 09/13/18 1629    Visit Number  [id number]    Date for PT Re-Evaluation  09/30/18    PT Start Time  1345    PT Stop Time  1500    PT Time Calculation (min)  75 min    Activity Tolerance  Patient tolerated treatment well    Behavior During Therapy  [facility] for tasks assessed/performed       Past Medical History:  Diagnosis Date  . Congenital musculoskeletal deformity of spine   . Hypertension   . Incontinence of bowel 09/2013  . Lumbago   . Lumbosacral spondylosis without myelopathy   . Sleep disorder    uses gabapentin at bedtime   . Spinal stenosis, lumbar region, without neurogenic claudication     Past Surgical History:  Procedure Laterality Date  . ANKLE SURGERY Right 2015   lengthened the achilles   . AUGMENTATION MAMMAPLASTY Bilateral 2000 or 2001 unsure    Left Implant has busted and is no longer visible  . BACK SURGERY  2015   fusion , Dr Patrice Paradise at [facility]; has 2 metal rods in place , fusion from t2 to s1   . BACK SURGERY  01/2017   Dr Rennis Harding ; repair of cracked titanium rod in back lumbar   . bunionectomy  Bilateral 1980s   multiple   . COLONOSCOPY     with polypectomy ; q 5 years   . FOOT SURGERY  07/2011  . SHOULDER SURGERY Left 2013   rotator cuff   . TOTAL HIP ARTHROPLASTY Right 05/08/2017   Procedure: RIGHT TOTAL HIP ARTHROPLASTY ANTERIOR APPROACH;  Surgeon: Paralee Cancel, MD;  Location: [facility];  Service: Orthopedics;  Laterality: Right;  70 mins    There were no vitals filed for this visit.  Subjective Assessment - 09/13/18 1627    Subjective  My back muscles were sore from those  little exercises!    Currently in Pain?  No/denies   LT back muscles are sore from exs     Treatment: Water temp 86.7 degrees Pt exited and entered the pool using long ramp and bil hand rails.   Seated exs with concurrent review of status and discussion of todays treatment goals.  Ankle and knee AROM exs submerged.  Water walking, waist deep. Pt able to generate small current today to increase her difficulty/resistance. 4x each direction, used large noodle to support her back per pt choice today.   Standing with large noodle in front of pt to aide balance: Waist deep water Single leg stance, Bil 4x Pt could not do longer than 5 se, maybe a little longer on the LT.  VC for core activation.   Marching 20x alternating Hip circles 2x10 Bil Decompression float x 2 min for fatigue  Bicycle with large noodle behind pt x 15 min without stopping.                           PT Short Term Goals - 09/04/18 1448      PT SHORT TERM GOAL #1   Title  ind with initial HEP    Time  2    Period  Weeks    Status  Achieved    Target Date  07/22/18      PT SHORT TERM GOAL #2   Title  Pt will report 25% less hip pain at night    Baseline  Abolished    Time  6    Period  Weeks    Status  Achieved        PT Long Term Goals - 09/11/18 1508      PT LONG TERM GOAL #1   Title  pt will be independent with HEP and consistently doing it on her own at least 5x/week    Time  12    Period  Weeks    Status  On-going            Plan - 09/13/18 1632    Clinical Impression Statement  Pt was sore from her muitifidus exercises performed on Wednesday in the clinic. Today pt  wanted to focus on hip strength and balance in the water. Pt had great difficulty balancing on one leg even in the water and using a pool noodle. She could balance for maybe 5 sec. Pt was able to generate a small current with her water walking in order to increease her resistance. Pt was also able to  perform water bicycle for 15 minutes without stopping. Pt had no pain throughout the session and needed some verbal cuing for core activation and Rt gluteal activation when in single leg stance.    Personal Factors and Comorbidities  Comorbidity 2    Comorbidities  back surgery, THA, sleep dysfunction    Examination-Activity Limitations  Bed Mobility;Locomotion Level;Sleep    Stability/Clinical Decision Making  Evolving/Moderate complexity    Rehab Potential  Excellent    PT Frequency  3x / week    PT Duration  12 weeks    PT Treatment/Interventions  ADLs/Self Care Home Management;Biofeedback;Cryotherapy;Electrical Stimulation;Iontophoresis 4mg /ml Dexamethasone;Moist Heat;Gait training;Stair training;Functional mobility training;Therapeutic activities;Therapeutic exercise;Neuromuscular re-education;Manual techniques;Dry needling;Passive range of motion;Taping;Scar mobilization;Aquatic Therapy    PT Next Visit Plan  land therapy next, review muitifidus exs given last for HEP    PT Home Exercise Plan  Access Code: [email or web address]    Consulted and Agree with Plan of Care  Patient       Patient will benefit from skilled therapeutic intervention in order to improve the following deficits and impairments:  Abnormal gait, Difficulty walking, Impaired flexibility, Decreased strength, Decreased range of motion, Pain, Postural dysfunction  Visit Diagnosis: Pain in left leg  Abnormal posture  Difficulty in walking, not elsewhere classified  Muscle weakness (generalized)     Problem List Patient Active Problem List   Diagnosis Date Noted  . Hip contracture, unspecified laterality 07/02/2018  . Overweight (BMI 25.0-29.9) 05/09/2017  . S/P right THA, AA 05/08/2017  . Chronic bilateral low back pain without sciatica 04/05/2015  . Disorder of sacroiliac joint 12/22/2014  . Chronic low back pain 12/22/2014  . Acquired scoliosis 11/27/2011  . Lumbosacral spondylosis without myelopathy 05/22/2011     [name], PTA 09/13/2018, 4:39 PM  [facility] [address], [place], [place], [address] Phone: [phone]   Fax:  [phone]  Name: [name] MRN: [id number] Date of Birth: 1945-07-23

## 2018-09-18 ENCOUNTER — Other Ambulatory Visit: Payer: Self-pay

## 2018-09-18 ENCOUNTER — Encounter: Payer: Self-pay | Admitting: Physical Therapy

## 2018-09-18 ENCOUNTER — Ambulatory Visit: Payer: Medicare Other | Admitting: Physical Therapy

## 2018-09-18 DIAGNOSIS — M6281 Muscle weakness (generalized): Secondary | ICD-10-CM

## 2018-09-18 DIAGNOSIS — R262 Difficulty in walking, not elsewhere classified: Secondary | ICD-10-CM

## 2018-09-18 DIAGNOSIS — M79605 Pain in left leg: Secondary | ICD-10-CM | POA: Diagnosis not present

## 2018-09-18 DIAGNOSIS — R293 Abnormal posture: Secondary | ICD-10-CM

## 2018-09-18 NOTE — Therapy (Signed)
[facility] [address] [place], [place], [address] Phone: [phone]   Fax:  [phone]  Physical Therapy Treatment  Patient Details  Name: [name] MRN: [id number] Date of Birth: 1945/08/18 Referring Provider (PT): Letta Pate Luanna Salk, MD   Encounter Date: 09/18/2018  PT End of Session - 09/18/18 1623    Visit Number  24    Date for PT Re-Evaluation  09/30/18    PT Start Time  1617    PT Stop Time  1700    PT Time Calculation (min)  43 min    Activity Tolerance  Patient tolerated treatment well    Behavior During Therapy  [facility] for tasks assessed/performed       Past Medical History:  Diagnosis Date  . Congenital musculoskeletal deformity of spine   . Hypertension   . Incontinence of bowel 09/2013  . Lumbago   . Lumbosacral spondylosis without myelopathy   . Sleep disorder    uses gabapentin at bedtime   . Spinal stenosis, lumbar region, without neurogenic claudication     Past Surgical History:  Procedure Laterality Date  . ANKLE SURGERY Right 2015   lengthened the achilles   . AUGMENTATION MAMMAPLASTY Bilateral 2000 or 2001 unsure    Left Implant has busted and is no longer visible  . BACK SURGERY  2015   fusion , Dr Patrice Paradise at [facility]; has 2 metal rods in place , fusion from t2 to s1   . BACK SURGERY  01/2017   Dr Rennis Harding ; repair of cracked titanium rod in back lumbar   . bunionectomy  Bilateral 1980s   multiple   . COLONOSCOPY     with polypectomy ; q 5 years   . FOOT SURGERY  07/2011  . SHOULDER SURGERY Left 2013   rotator cuff   . TOTAL HIP ARTHROPLASTY Right 05/08/2017   Procedure: RIGHT TOTAL HIP ARTHROPLASTY ANTERIOR APPROACH;  Surgeon: Paralee Cancel, MD;  Location: [facility];  Service: Orthopedics;  Laterality: Right;  70 mins    There were no vitals filed for this visit.  Subjective Assessment - 09/18/18 1624    Subjective  I was so sore from being in the pool for  an hour! I was surprised! Walked almost 3/4 mile yesterday. I also walked Monday.    Currently in Pain?  No/denies    Multiple Pain Sites  No                       [facility] Adult PT Treatment/Exercise - 09/18/18 0001      Lumbar Exercises: Aerobic   Nustep  L4 x 7 min: pt was not able to come early to ride longer      Lumbar Exercises: Standing   Other Standing Lumbar Exercises  weight shift 10x with LTLE in front       Lumbar Exercises: Seated   Other Seated Lumbar Exercises  Yellow band Multifidus activation10x 5 sec hold    Added to HEP     Lumbar Exercises: Supine   Other Supine Lumbar Exercises  Multifidus heel push into towel 10x 5 sec hold Bil   Added to HEP     Knee/Hip Exercises: Stretches   Hip Flexor Stretch  --   Foot on 3rd step: bil 2x 30 sec   Piriformis Stretch  Both;1 rep;30 seconds      Knee/Hip Exercises: Sidelying   Hip ABduction  Strengthening;Left;2 sets;15 reps  Hip ABduction Limitations  Improved ability to lift leg with quality from Glute medius               PT Short Term Goals - 09/04/18 1448      PT SHORT TERM GOAL #1   Title  ind with initial HEP    Time  2    Period  Weeks    Status  Achieved    Target Date  07/22/18      PT SHORT TERM GOAL #2   Title  Pt will report 25% less hip pain at night    Baseline  Abolished    Time  6    Period  Weeks    Status  Achieved        PT Long Term Goals - 09/11/18 1508      PT LONG TERM GOAL #1   Title  pt will be independent with HEP and consistently doing it on her own at least 5x/week    Time  12    Period  Weeks    Status  On-going            Plan - 09/18/18 1623    Clinical Impression Statement  Pt was super sore in her muscles ( mostly back) being in the pool for 60+ minutes. She was ok with this soreness as is dissipated fully the next day. She presents today painfree. Back was sore at end of session.    Personal Factors and Comorbidities  Comorbidity 2     Comorbidities  back surgery, THA, sleep dysfunction    Examination-Activity Limitations  Bed Mobility;Locomotion Level;Sleep    Stability/Clinical Decision Making  Evolving/Moderate complexity    Rehab Potential  Excellent    PT Frequency  3x / week    PT Duration  12 weeks    PT Treatment/Interventions  ADLs/Self Care Home Management;Biofeedback;Cryotherapy;Electrical Stimulation;Iontophoresis 4mg /ml Dexamethasone;Moist Heat;Gait training;Stair training;Functional mobility training;Therapeutic activities;Therapeutic exercise;Neuromuscular re-education;Manual techniques;Dry needling;Passive range of motion;Taping;Scar mobilization;Aquatic Therapy    PT Next Visit Plan  land therapy next, review muitifidus exs given last for HEP, evaluate for dry needling to her back muscles.    PT Home Exercise Plan  Access Code: [email or web address]    Recommended Other Services  Re-evaluation on next Wednesday. No aquatic appts available this Friday. Pt would like to continue PT 1 month to work on her balance,  Lt hip strength and she would still like to walk a mile.    Consulted and Agree with Plan of Care  Patient       Patient will benefit from skilled therapeutic intervention in order to improve the following deficits and impairments:  Abnormal gait, Difficulty walking, Impaired flexibility, Decreased strength, Decreased range of motion, Pain, Postural dysfunction  Visit Diagnosis: Pain in left leg  Abnormal posture  Difficulty in walking, not elsewhere classified  Muscle weakness (generalized)     Problem List Patient Active Problem List   Diagnosis Date Noted  . Hip contracture, unspecified laterality 07/02/2018  . Overweight (BMI 25.0-29.9) 05/09/2017  . S/P right THA, AA 05/08/2017  . Chronic bilateral low back pain without sciatica 04/05/2015  . Disorder of sacroiliac joint 12/22/2014  . Chronic low back pain 12/22/2014  . Acquired scoliosis 11/27/2011  . Lumbosacral spondylosis without  myelopathy 05/22/2011    COCHRAN,JENNIFER, PTA 09/18/2018, 5:05 PM  [facility] [address] [place], [place], [address] Phone: [phone]   Fax:  [phone]  Name: [name] MRN:  [id number] Date of Birth: 07-Sep-1945

## 2018-09-25 ENCOUNTER — Other Ambulatory Visit: Payer: Self-pay

## 2018-09-25 ENCOUNTER — Ambulatory Visit: Payer: Medicare Other | Admitting: Physical Therapy

## 2018-09-25 DIAGNOSIS — R262 Difficulty in walking, not elsewhere classified: Secondary | ICD-10-CM

## 2018-09-25 DIAGNOSIS — M6281 Muscle weakness (generalized): Secondary | ICD-10-CM

## 2018-09-25 DIAGNOSIS — M79605 Pain in left leg: Secondary | ICD-10-CM

## 2018-09-25 DIAGNOSIS — R293 Abnormal posture: Secondary | ICD-10-CM

## 2018-09-25 NOTE — Therapy (Signed)
[facility] [address] [place], [place], [address] Phone: [phone]   Fax:  [phone]  Physical Therapy Treatment  Patient Details  Name: [name] MRN: [id number] Date of Birth: 10-22-1945 Referring Provider (PT): Letta Pate Luanna Salk, MD   Encounter Date: 09/25/2018  PT End of Session - 09/25/18 1408    Visit Number  25    Date for PT Re-Evaluation  11/06/18    PT Start Time  1401    PT Stop Time  1446    PT Time Calculation (min)  45 min    Activity Tolerance  Patient tolerated treatment well    Behavior During Therapy  [facility] for tasks assessed/performed       Past Medical History:  Diagnosis Date  . Congenital musculoskeletal deformity of spine   . Hypertension   . Incontinence of bowel 09/2013  . Lumbago   . Lumbosacral spondylosis without myelopathy   . Sleep disorder    uses gabapentin at bedtime   . Spinal stenosis, lumbar region, without neurogenic claudication     Past Surgical History:  Procedure Laterality Date  . ANKLE SURGERY Right 2015   lengthened the achilles   . AUGMENTATION MAMMAPLASTY Bilateral 2000 or 2001 unsure    Left Implant has busted and is no longer visible  . BACK SURGERY  2015   fusion , Dr Patrice Paradise at [facility]; has 2 metal rods in place , fusion from t2 to s1   . BACK SURGERY  01/2017   Dr Rennis Harding ; repair of cracked titanium rod in back lumbar   . bunionectomy  Bilateral 1980s   multiple   . COLONOSCOPY     with polypectomy ; q 5 years   . FOOT SURGERY  07/2011  . SHOULDER SURGERY Left 2013   rotator cuff   . TOTAL HIP ARTHROPLASTY Right 05/08/2017   Procedure: RIGHT TOTAL HIP ARTHROPLASTY ANTERIOR APPROACH;  Surgeon: Paralee Cancel, MD;  Location: [facility];  Service: Orthopedics;  Laterality: Right;  70 mins    There were no vitals filed for this visit.  Subjective Assessment - 09/25/18 1411    Subjective  Pt got a heel lift yesterday.  Feels like  I am going backwards in my gait.  It will be hard to get used to.  I walked about 3/4 of a mile today. Pt denies pain today.    Limitations  Walking    Patient Stated Goals  be able to go for walks again    Currently in Pain?  No/denies         [facility] PT Assessment - 09/26/18 0001      Assessment   Medical Diagnosis  M24.559 (ICD-10-CM) - Hip flexor tightness, unspecified laterality    Referring Provider (PT)  Kirsteins, Luanna Salk, MD      Observation/Other Assessments   Focus on Therapeutic Outcomes (FOTO)   53%      Strength   Left Hip ABduction  4+/5      Ambulation/Gait   Assistive device  --   shoe lift 1 and 1/8 inch Rt LE   Gait Pattern  Trunk flexed;Lateral trunk lean to right      Standardized Balance Assessment   Standardized Balance Assessment  Five Times Sit to Stand    Five times sit to stand comments   17 sec                   [facility]  Adult PT Treatment/Exercise - 09/26/18 0001      Lumbar Exercises: Aerobic   Nustep  L4 x 10 min   PT present for status update     Lumbar Exercises: Standing   Other Standing Lumbar Exercises  weight shifting side to side and fwd/back - cues to keep pelvis neutral and keep Rt LE quad and gluteals activated    Other Standing Lumbar Exercises  single leg stand - Lt9 sec; Rt 7 sec (after 3 atttempts)      Knee/Hip Exercises: Stretches   Piriformis Stretch  Both;1 rep;30 seconds               PT Short Term Goals - 09/04/18 1448      PT SHORT TERM GOAL #1   Title  ind with initial HEP    Time  2    Period  Weeks    Status  Achieved    Target Date  07/22/18      PT SHORT TERM GOAL #2   Title  Pt will report 25% less hip pain at night    Baseline  Abolished    Time  6    Period  Weeks    Status  Achieved        PT Long Term Goals - 09/25/18 1415      PT LONG TERM GOAL #1   Title  pt will be independent with HEP and consistently doing it on her own at least 5x/week    Time  6    Period  Weeks     Status  On-going    Target Date  11/06/18      PT LONG TERM GOAL #2   Title  Pt will be able to demonstrate hip extension ROM to neutral for improved gait and upright posture    Baseline  equal Rt and Lt both 10 deg    Time  6    Period  Weeks    Status  On-going    Target Date  11/06/18      PT LONG TERM GOAL #3   Title  pt will report increased walking tolerance to 1 mile    Time  6    Period  Weeks    Status  Revised    Target Date  11/06/18      PT LONG TERM GOAL #4   Title  pt will decrease FOTO to 47% or less    Baseline  53%    Time  6    Period  Weeks    Status  On-going    Target Date  11/06/18      PT LONG TERM GOAL #5   Title  Pt will perform 5 x sit to stand in less than 13 seconds    Baseline  17 sec    Time  6    Period  Weeks    Status  New    Target Date  11/06/18      Additional Long Term Goals   Additional Long Term Goals  Yes      PT LONG TERM GOAL #6   Title  Pt will be able to perform single leg stand x 10 sec on each side    Time  6    Period  Weeks    Status  New    Target Date  11/06/18            Plan - 09/25/18 1428    Clinical Impression  Statement  Pt has been making very steady progross and is now walking 3/4 of a mile up from 1/4.  Pt has recently gotten a shoe lift and is expected to have new issues arise from having to adjust from the new gait and posture with the 1 and 1/8 inch lift in Rt shoe.  Pt continue to have flexed posutre but hip extension is now equal on both sides. She  fatigued with exercises to work on weight shifting in her new shoes.  She responded well and felt muscles fatigued.  Pt will benefit from skilled PT to continue working on strength andendurance so she can build up to her goal of walking one mile.  She will also need further skilled PT now to assist in improving her gait with new corrective shoes.  Pt demonstrates balance deficits based off of her 5xsit to stand and single leg stance.  New goals were made  to address these issues as well in order to increase her walking distance safely with reduced risk of falls.    Comorbidities  back surgery, THA, sleep dysfunction    Examination-Activity Limitations  Bed Mobility;Locomotion Level;Sleep    Stability/Clinical Decision Making  Evolving/Moderate complexity    Clinical Decision Making  Moderate    Rehab Potential  Excellent    PT Frequency  2x / week    PT Duration  6 weeks    PT Treatment/Interventions  ADLs/Self Care Home Management;Biofeedback;Cryotherapy;Electrical Stimulation;Iontophoresis 4mg /ml Dexamethasone;Moist Heat;Gait training;Stair training;Functional mobility training;Therapeutic activities;Therapeutic exercise;Neuromuscular re-education;Manual techniques;Dry needling;Passive range of motion;Taping;Scar mobilization;Aquatic Therapy    PT Next Visit Plan  aquatic next, continue core and hip strength, balance, posture and DN/STM to Rt QL and lumbar paraspinals    PT Home Exercise Plan  Access Code: [email or web address]    Consulted and Agree with Plan of Care  Patient       Patient will benefit from skilled therapeutic intervention in order to improve the following deficits and impairments:  Abnormal gait, Difficulty walking, Impaired flexibility, Decreased strength, Decreased range of motion, Pain, Postural dysfunction  Visit Diagnosis: Pain in left leg  Abnormal posture  Difficulty in walking, not elsewhere classified  Muscle weakness (generalized)     Problem List Patient Active Problem List   Diagnosis Date Noted  . Hip contracture, unspecified laterality 07/02/2018  . Overweight (BMI 25.0-29.9) 05/09/2017  . S/P right THA, AA 05/08/2017  . Chronic bilateral low back pain without sciatica 04/05/2015  . Disorder of sacroiliac joint 12/22/2014  . Chronic low back pain 12/22/2014  . Acquired scoliosis 11/27/2011  . Lumbosacral spondylosis without myelopathy 05/22/2011    Jule Ser, PT 09/26/2018, 5:02 PM  [facility] [address], [place], [place], [address] Phone: [phone]   Fax:  [phone]  Name: Rosemarie Rohr MRN: [id number] Date of Birth: May 04, 1945

## 2018-09-26 ENCOUNTER — Ambulatory Visit
Admission: RE | Admit: 2018-09-26 | Discharge: 2018-09-26 | Disposition: A | Discharge status: Home / Self Care | Payer: Medicare Other | Source: Ambulatory Visit | Attending: Family Medicine | Admitting: Family Medicine

## 2018-09-26 ENCOUNTER — Other Ambulatory Visit: Payer: Self-pay

## 2018-09-26 DIAGNOSIS — Z1231 Encounter for screening mammogram for malignant neoplasm of breast: Secondary | ICD-10-CM

## 2018-09-27 ENCOUNTER — Other Ambulatory Visit: Payer: Self-pay

## 2018-09-27 ENCOUNTER — Ambulatory Visit: Payer: Medicare Other | Admitting: Physical Therapy

## 2018-09-27 ENCOUNTER — Encounter: Payer: Self-pay | Admitting: Physical Therapy

## 2018-09-27 DIAGNOSIS — R293 Abnormal posture: Secondary | ICD-10-CM

## 2018-09-27 DIAGNOSIS — M79605 Pain in left leg: Secondary | ICD-10-CM | POA: Diagnosis not present

## 2018-09-27 DIAGNOSIS — M6281 Muscle weakness (generalized): Secondary | ICD-10-CM

## 2018-09-27 DIAGNOSIS — R262 Difficulty in walking, not elsewhere classified: Secondary | ICD-10-CM

## 2018-09-27 NOTE — Therapy (Signed)
[facility] [address] [place], [place], [address] Phone: [phone]   Fax:  [phone]  Physical Therapy Treatment  Patient Details  Name: [name] MRN: [id number] Date of Birth: May 11, 1945 Referring Provider (PT): Letta Pate Luanna Salk, MD   Encounter Date: 09/27/2018  PT End of Session - 09/27/18 2006    Visit Number  26    Date for PT Re-Evaluation  11/06/18    PT Start Time  1200    PT Stop Time  1300    PT Time Calculation (min)  60 min    Activity Tolerance  Patient tolerated treatment well    Behavior During Therapy   [facility] for tasks assessed/performed       Past Medical History:  Diagnosis Date  . Congenital musculoskeletal deformity of spine   . Hypertension   . Incontinence of bowel 09/2013  . Lumbago   . Lumbosacral spondylosis without myelopathy   . Sleep disorder    uses gabapentin at bedtime   . Spinal stenosis, lumbar region, without neurogenic claudication     Past Surgical History:  Procedure Laterality Date  . ANKLE SURGERY Right 2015   lengthened the achilles   . AUGMENTATION MAMMAPLASTY Bilateral 2000 or 2001 unsure    Left Implant has busted and is no longer visible  . BACK SURGERY  2015   fusion , Dr Patrice Paradise at [facility]; has 2 metal rods in place , fusion from t2 to s1   . BACK SURGERY  01/2017   Dr Rennis Harding ; repair of cracked titanium rod in back lumbar   . bunionectomy  Bilateral 1980s   multiple   . COLONOSCOPY     with polypectomy ; q 5 years   . FOOT SURGERY  07/2011  . SHOULDER SURGERY Left 2013   rotator cuff   . TOTAL HIP ARTHROPLASTY Right 05/08/2017   Procedure: RIGHT TOTAL HIP ARTHROPLASTY ANTERIOR APPROACH;  Surgeon: Paralee Cancel, MD;  Location: [facility];  Service: Orthopedics;  Laterality: Right;  70 mins    There were no vitals filed for this visit.  Subjective Assessment - 09/27/18 2004    Subjective  Not sure I like the shoe being built up.  It is hard to find my COG, I feel off balance and my back was super sore yesterday.    Currently in Pain?  Yes    Pain Score  2     Pain Location  Back    Pain Orientation  Left    Pain Descriptors / Indicators  Sore    Aggravating Factors   Probably overdoing activities, maybe this new shoe?    Pain Relieving Factors  resting, exercising in the pool.    Multiple Pain Sites  No       Treatment: Aquatic Session: Water temp 86.7 degrees F Pt entered the pool via long ramp using bil hand rails.   Seated: Concurrent discussion of current status and pool. Ankle, knee, hip AROM to warm up joints total submersion for joint support.   Mid trunk depth water walking 2x length of the pool in each direction, adding the pool noodle for rowing or pushing. No UE for sidestepping.  Standing core compressions pushing noodle under the water. Hold 5 sec 10x. Hip abduction 20x bil.   Standing single leg stance with pt holding noodle: bil 5x 5 sec was greatest onLTLEand then 10 sec greatest on RTLE.  High stepping, slow to emphasize SLS length of  the pool 4x. Pt required short rest break in between the laps.   Supported bicycle x 6 min                            PT Short Term Goals - 09/04/18 1448      PT SHORT TERM GOAL #1   Title  ind with initial HEP    Time  2    Period  Weeks    Status  Achieved    Target Date  07/22/18      PT SHORT TERM GOAL #2   Title  Pt will report 25% less hip pain at night    Baseline  Abolished    Time  6    Period  Weeks    Status  Achieved        PT Long Term Goals - 09/25/18 1415      PT LONG TERM GOAL #1   Title  pt will be independent with HEP and consistently doing it on her own at least 5x/week    Time  6    Period  Weeks    Status  On-going    Target Date  11/06/18      PT LONG TERM GOAL #2   Title  Pt will be able to demonstrate hip extension ROM to neutral for improved gait and upright posture    Baseline  equal Rt  and Lt both 10 deg    Time  6    Period  Weeks    Status  On-going    Target Date  11/06/18      PT LONG TERM GOAL #3   Title  pt will report increased walking tolerance to 1 mile    Time  6    Period  Weeks    Status  Revised    Target Date  11/06/18      PT LONG TERM GOAL #4   Title  pt will decrease FOTO to 47% or less    Baseline  53%    Time  6    Period  Weeks    Status  On-going    Target Date  11/06/18      PT LONG TERM GOAL #5   Title  Pt will perform 5 x sit to stand in less than 13 seconds    Baseline  17 sec    Time  6    Period  Weeks    Status  New    Target Date  11/06/18      Additional Long Term Goals   Additional Long Term Goals  Yes      PT LONG TERM GOAL #6   Title  Pt will be able to perform single leg stand x 10 sec on each side    Time  6    Period  Weeks    Status  New    Target Date  11/06/18            Plan - 09/27/18 2008    Clinical Impression Statement  Pt having difficult time adjusting to new shoe. She reports having a lot of pain yesterday which she contributes to wearing her shoe for walking and back to back grocery shopping. PTA suggested more of a weaning into process but pt felt that was not a good idea. Pt was able to perform balance activities in the pool that focused on her single leg stance. This  is challenging for pt to more than 5 sec on her LTLE. Pt continues to build more current, increasing her resistance n the water with her aqua walking. She was also able to coordinate UE movements with her walking to work on her postural strength andendurance.    Personal Factors and Comorbidities  Comorbidity 2    Comorbidities  back surgery, THA, sleep dysfunction    Examination-Activity Limitations  Bed Mobility;Locomotion Level;Sleep    Stability/Clinical Decision Making  Evolving/Moderate complexity    Rehab Potential  Excellent    PT Frequency  2x / week    PT Duration  6 weeks    PT Treatment/Interventions  ADLs/Self Care  Home Management;Biofeedback;Cryotherapy;Electrical Stimulation;Iontophoresis 4mg /ml Dexamethasone;Moist Heat;Gait training;Stair training;Functional mobility training;Therapeutic activities;Therapeutic exercise;Neuromuscular re-education;Manual techniques;Dry needling;Passive range of motion;Taping;Scar mobilization;Aquatic Therapy    PT Next Visit Plan  land PT next,  continue core and hip strength, balance, posture and DN/STM to Rt QL and lumbar paraspinals    PT Home Exercise Plan  Access Code: [email or web address]    Consulted and Agree with Plan of Care  Patient       Patient will benefit from skilled therapeutic intervention in order to improve the following deficits and impairments:  Abnormal gait, Difficulty walking, Impaired flexibility, Decreased strength, Decreased range of motion, Pain, Postural dysfunction  Visit Diagnosis: Pain in left leg  Abnormal posture  Difficulty in walking, not elsewhere classified  Muscle weakness (generalized)     Problem List Patient Active Problem List   Diagnosis Date Noted  . Hip contracture, unspecified laterality 07/02/2018  . Overweight (BMI 25.0-29.9) 05/09/2017  . S/P right THA, AA 05/08/2017  . Chronic bilateral low back pain without sciatica 04/05/2015  . Disorder of sacroiliac joint 12/22/2014  . Chronic low back pain 12/22/2014  . Acquired scoliosis 11/27/2011  . Lumbosacral spondylosis without myelopathy 05/22/2011    Kymberly Blomberg, PTA 09/27/2018, 8:13 PM  [facility] [address], [place], [place], [address] Phone: [phone]   Fax:  [phone]  Name: [name] MRN: [id number] Date of Birth: June 20, 1945

## 2018-09-30 ENCOUNTER — Other Ambulatory Visit: Payer: Self-pay

## 2018-09-30 ENCOUNTER — Ambulatory Visit: Payer: Medicare Other | Admitting: Physical Therapy

## 2018-09-30 ENCOUNTER — Encounter: Payer: Self-pay | Admitting: Physical Therapy

## 2018-09-30 DIAGNOSIS — M79605 Pain in left leg: Secondary | ICD-10-CM | POA: Diagnosis not present

## 2018-09-30 DIAGNOSIS — M6281 Muscle weakness (generalized): Secondary | ICD-10-CM

## 2018-09-30 DIAGNOSIS — R262 Difficulty in walking, not elsewhere classified: Secondary | ICD-10-CM

## 2018-09-30 DIAGNOSIS — R293 Abnormal posture: Secondary | ICD-10-CM

## 2018-09-30 NOTE — Therapy (Signed)
[facility] [address] [place], [place], [address] Phone: [phone]   Fax:  [phone]  Physical Therapy Treatment  Patient Details  Name: [name] MRN: [id number] Date of Birth: 1945-12-25 Referring Provider (PT): Letta Pate Luanna Salk, MD   Encounter Date: 09/30/2018  PT End of Session - 09/30/18 1348    Visit Number  27    Date for PT Re-Evaluation  11/06/18    PT Start Time  1352    PT Stop Time  1432    PT Time Calculation (min)  40 min    Activity Tolerance  Patient tolerated treatment well    Behavior During Therapy  [facility] for tasks assessed/performed       Past Medical History:  Diagnosis Date  . Congenital musculoskeletal deformity of spine   . Hypertension   . Incontinence of bowel 09/2013  . Lumbago   . Lumbosacral spondylosis without myelopathy   . Sleep disorder    uses gabapentin at bedtime   . Spinal stenosis, lumbar region, without neurogenic claudication     Past Surgical History:  Procedure Laterality Date  . ANKLE SURGERY Right 2015   lengthened the achilles   . AUGMENTATION MAMMAPLASTY Bilateral 2000 or 2001 unsure    Left Implant has busted and is no longer visible  . BACK SURGERY  2015   fusion , Dr Patrice Paradise at [facility]; has 2 metal rods in place , fusion from t2 to s1   . BACK SURGERY  01/2017   Dr Rennis Harding ; repair of cracked titanium rod in back lumbar   . bunionectomy  Bilateral 1980s   multiple   . COLONOSCOPY     with polypectomy ; q 5 years   . FOOT SURGERY  07/2011  . SHOULDER SURGERY Left 2013   rotator cuff   . TOTAL HIP ARTHROPLASTY Right 05/08/2017   Procedure: RIGHT TOTAL HIP ARTHROPLASTY ANTERIOR APPROACH;  Surgeon: Paralee Cancel, MD;  Location: [facility];  Service: Orthopedics;  Laterality: Right;  70 mins    There were no vitals filed for this visit.  Subjective Assessment - 09/30/18 1401    Subjective  I still can't walk with this shoe, I feel  like starting over at square one. Denies pain.    Currently in Pain?  No/denies                       [facility] Adult PT Treatment/Exercise - 09/30/18 0001      Lumbar Exercises: Aerobic   Nustep  L4 x 10 min   PT present for status update;      Lumbar Exercises: Standing   Other Standing Lumbar Exercises  weight shifting side to side and fwd/back - cues to keep pelvis neutral and keep Rt LE quad and gluteals activated - heel strike       Lumbar Exercises: Sidelying   Clam  Left;20 reps;Right    Clam Limitations  red band    Hip Abduction  Left;15 reps      Manual Therapy   Soft tissue mobilization  gluteals       Trigger Point Dry Needling - 09/30/18 0001    Consent Given?  Yes    Education Handout Provided  Previously provided   verbally reviewed   Muscles Treated Back/Hip  Gluteus medius;Gluteus maximus;Piriformis;Tensor fascia lata    Gluteus Medius Response  Twitch response elicited;Palpable increased muscle length    Gluteus Maximus Response  Twitch response elicited;Palpable increased muscle length    Piriformis Response  Palpable increased muscle length;Twitch response elicited    Tensor Fascia Lata Response  Twitch response elicited;Palpable increased muscle length             PT Short Term Goals - 09/04/18 1448      PT SHORT TERM GOAL #1   Title  ind with initial HEP    Time  2    Period  Weeks    Status  Achieved    Target Date  07/22/18      PT SHORT TERM GOAL #2   Title  Pt will report 25% less hip pain at night    Baseline  Abolished    Time  6    Period  Weeks    Status  Achieved        PT Long Term Goals - 09/25/18 1415      PT LONG TERM GOAL #1   Title  pt will be independent with HEP and consistently doing it on her own at least 5x/week    Time  6    Period  Weeks    Status  On-going    Target Date  11/06/18      PT LONG TERM GOAL #2   Title  Pt will be able to demonstrate hip extension ROM to neutral for improved  gait and upright posture    Baseline  equal Rt and Lt both 10 deg    Time  6    Period  Weeks    Status  On-going    Target Date  11/06/18      PT LONG TERM GOAL #3   Title  pt will report increased walking tolerance to 1 mile    Time  6    Period  Weeks    Status  Revised    Target Date  11/06/18      PT LONG TERM GOAL #4   Title  pt will decrease FOTO to 47% or less    Baseline  53%    Time  6    Period  Weeks    Status  On-going    Target Date  11/06/18      PT LONG TERM GOAL #5   Title  Pt will perform 5 x sit to stand in less than 13 seconds    Baseline  17 sec    Time  6    Period  Weeks    Status  New    Target Date  11/06/18      Additional Long Term Goals   Additional Long Term Goals  Yes      PT LONG TERM GOAL #6   Title  Pt will be able to perform single leg stand x 10 sec on each side    Time  6    Period  Weeks    Status  New    Target Date  11/06/18            Plan - 09/30/18 1429    Clinical Impression Statement  Pt was educated in using the new shoe for less time initially until building the muscle.  Pt did well with weight shifting and keeping the pelvis level with moderate verbal cues.  She also needed cues to improve heel strike on the Rt foot when accepting weight into that leg.  Pt had trigger points in bilateral glutes Rt>Lt. She will benefit from skilled PT to conitnue  working on balance and strength with focus on improved gait mechanics.    PT Treatment/Interventions  ADLs/Self Care Home Management;Biofeedback;Cryotherapy;Electrical Stimulation;Iontophoresis 4mg /ml Dexamethasone;Moist Heat;Gait training;Stair training;Functional mobility training;Therapeutic activities;Therapeutic exercise;Neuromuscular re-education;Manual techniques;Dry needling;Passive range of motion;Taping;Scar mobilization;Aquatic Therapy    PT Next Visit Plan  aquatic PT next,  continue core and hip strength, balance, posture and DN/STM to Rt QL and lumbar paraspinals     PT Home Exercise Plan  Access Code: [email or web address]    Consulted and Agree with Plan of Care  Patient       Patient will benefit from skilled therapeutic intervention in order to improve the following deficits and impairments:  Abnormal gait, Difficulty walking, Impaired flexibility, Decreased strength, Decreased range of motion, Pain, Postural dysfunction  Visit Diagnosis: Pain in left leg  Abnormal posture  Difficulty in walking, not elsewhere classified  Muscle weakness (generalized)     Problem List Patient Active Problem List   Diagnosis Date Noted  . Hip contracture, unspecified laterality 07/02/2018  . Overweight (BMI 25.0-29.9) 05/09/2017  . S/P right THA, AA 05/08/2017  . Chronic bilateral low back pain without sciatica 04/05/2015  . Disorder of sacroiliac joint 12/22/2014  . Chronic low back pain 12/22/2014  . Acquired scoliosis 11/27/2011  . Lumbosacral spondylosis without myelopathy 05/22/2011    Jule Ser, PT 09/30/2018, 3:56 PM  [facility] [address], [place], [place], [address] Phone: [phone]   Fax:  [phone]  Name: Tameesha Zasada MRN: [id number] Date of Birth: 04-Feb-1945

## 2018-10-02 ENCOUNTER — Other Ambulatory Visit: Payer: Self-pay

## 2018-10-02 ENCOUNTER — Ambulatory Visit: Payer: Medicare Other | Admitting: Physical Therapy

## 2018-10-02 ENCOUNTER — Encounter: Payer: Self-pay | Admitting: Physical Therapy

## 2018-10-02 DIAGNOSIS — M6281 Muscle weakness (generalized): Secondary | ICD-10-CM

## 2018-10-02 DIAGNOSIS — M79605 Pain in left leg: Secondary | ICD-10-CM | POA: Diagnosis not present

## 2018-10-02 DIAGNOSIS — R293 Abnormal posture: Secondary | ICD-10-CM

## 2018-10-02 DIAGNOSIS — R262 Difficulty in walking, not elsewhere classified: Secondary | ICD-10-CM

## 2018-10-02 NOTE — Therapy (Signed)
[facility] [address] [place], [place], [address] Phone: [phone]   Fax:  [phone]  Physical Therapy Treatment  Patient Details  Name: [name] MRN: [id number] Date of Birth: [birth year] Referring Provider (PT): Letta Pate Luanna Salk, MD   Encounter Date: 10/02/2018  PT End of Session - 10/02/18 1233    Visit Number  28    Date for PT Re-Evaluation  11/06/18    PT Start Time  1233    PT Stop Time  1313    PT Time Calculation (min)  40 min    Activity Tolerance  Patient tolerated treatment well;Patient limited by fatigue    Behavior During Therapy  [facility] for tasks assessed/performed       Past Medical History:  Diagnosis Date  . Congenital musculoskeletal deformity of spine   . Hypertension   . Incontinence of bowel 09/2013  . Lumbago   . Lumbosacral spondylosis without myelopathy   . Sleep disorder    uses gabapentin at bedtime   . Spinal stenosis, lumbar region, without neurogenic claudication     Past Surgical History:  Procedure Laterality Date  . ANKLE SURGERY Right 2015   lengthened the achilles   . AUGMENTATION MAMMAPLASTY Bilateral 2000 or 2001 unsure    Left Implant has busted and is no longer visible  . BACK SURGERY  2015   fusion , Dr Patrice Paradise at [facility]; has 2 metal rods in place , fusion from t2 to s1   . BACK SURGERY  01/2017   Dr Rennis Harding ; repair of cracked titanium rod in back lumbar   . bunionectomy  Bilateral 1980s   multiple   . COLONOSCOPY     with polypectomy ; q 5 years   . FOOT SURGERY  07/2011  . SHOULDER SURGERY Left 2013   rotator cuff   . TOTAL HIP ARTHROPLASTY Right 05/08/2017   Procedure: RIGHT TOTAL HIP ARTHROPLASTY ANTERIOR APPROACH;  Surgeon: Paralee Cancel, MD;  Location: [facility];  Service: Orthopedics;  Laterality: Right;  70 mins    There were no vitals filed for this visit.  Subjective Assessment - 10/02/18 1238    Subjective  Dry needling  helped a lot! My muscles felt better.    Currently in Pain?  No/denies    Multiple Pain Sites  No                       [facility] Adult PT Treatment/Exercise - 10/02/18 0001      Lumbar Exercises: Standing   Other Standing Lumbar Exercises  Soft tissue work with ball on the wall for home use. PT can do gluteals and some lumbar    Other Standing Lumbar Exercises  weight sfting forward and back and side to side keeping pelvis neutral      Lumbar Exercises: Sidelying   Clam  Right;10 reps    Clam Limitations  red band   Pt reports she just did some at home so we only did 1 set   Hip Abduction  --   Bent knee: hold 10 sec 3x, will do this at home     Manual Therapy   Soft tissue mobilization  Addaday assist to LT glutes and lateral hip & leg               PT Short Term Goals - 09/04/18 1448      PT SHORT TERM GOAL #1   Title  ind with initial HEP    Time  2    Period  Weeks    Status  Achieved    Target Date  07/22/18      PT SHORT TERM GOAL #2   Title  Pt will report 25% less hip pain at night    Baseline  Abolished    Time  6    Period  Weeks    Status  Achieved        PT Long Term Goals - 09/25/18 1415      PT LONG TERM GOAL #1   Title  pt will be independent with HEP and consistently doing it on her own at least 5x/week    Time  6    Period  Weeks    Status  On-going    Target Date  11/06/18      PT LONG TERM GOAL #2   Title  Pt will be able to demonstrate hip extension ROM to neutral for improved gait and upright posture    Baseline  equal Rt and Lt both 10 deg    Time  6    Period  Weeks    Status  On-going    Target Date  11/06/18      PT LONG TERM GOAL #3   Title  pt will report increased walking tolerance to 1 mile    Time  6    Period  Weeks    Status  Revised    Target Date  11/06/18      PT LONG TERM GOAL #4   Title  pt will decrease FOTO to 47% or less    Baseline  53%    Time  6    Period  Weeks    Status  On-going     Target Date  11/06/18      PT LONG TERM GOAL #5   Title  Pt will perform 5 x sit to stand in less than 13 seconds    Baseline  17 sec    Time  6    Period  Weeks    Status  New    Target Date  11/06/18      Additional Long Term Goals   Additional Long Term Goals  Yes      PT LONG TERM GOAL #6   Title  Pt will be able to perform single leg stand x 10 sec on each side    Time  6    Period  Weeks    Status  New    Target Date  11/06/18            Plan - 10/02/18 1313    Clinical Impression Statement  Pt received a lot of musclular relief from the dry needling last session. PTA showed pt how to use the tennis ball on the wall for home gluteal release work, this was pretty tender for pt to do.  Pt could abduct her LT hip with shortened lever and hold 10 sec 3x with an excellent lift compared to what she could previously do. Gluteals and LT hip felt better/looser post tx today.    Personal Factors and Comorbidities  Comorbidity 2    Comorbidities  back surgery, THA, sleep dysfunction    Examination-Activity Limitations  Bed Mobility;Locomotion Level;Sleep    Stability/Clinical Decision Making  Evolving/Moderate complexity    Rehab Potential  Excellent    PT Frequency  2x / week    PT Duration  6 weeks    PT Treatment/Interventions  ADLs/Self Care Home Management;Biofeedback;Cryotherapy;Electrical Stimulation;Iontophoresis 4mg /ml Dexamethasone;Moist Heat;Gait training;Stair training;Functional mobility training;Therapeutic activities;Therapeutic exercise;Neuromuscular re-education;Manual techniques;Dry needling;Passive range of motion;Taping;Scar mobilization;Aquatic Therapy    PT Next Visit Plan  Dry needling next visit    PT Home Exercise Plan  Access Code: [email or web address]    Consulted and Agree with Plan of Care  Patient       Patient will benefit from skilled therapeutic intervention in order to improve the following deficits and impairments:  Abnormal gait, Difficulty walking,  Impaired flexibility, Decreased strength, Decreased range of motion, Pain, Postural dysfunction  Visit Diagnosis: Pain in left leg  Abnormal posture  Difficulty in walking, not elsewhere classified  Muscle weakness (generalized)     Problem List Patient Active Problem List   Diagnosis Date Noted  . Hip contracture, unspecified laterality 07/02/2018  . Overweight (BMI 25.0-29.9) 05/09/2017  . S/P right THA, AA 05/08/2017  . Chronic bilateral low back pain without sciatica 04/05/2015  . Disorder of sacroiliac joint 12/22/2014  . Chronic low back pain 12/22/2014  . Acquired scoliosis 11/27/2011  . Lumbosacral spondylosis without myelopathy 05/22/2011    Mckenleigh Tarlton, PTA 10/02/2018, 1:19 PM   [facility] [address] [place], [place], [address] Phone: [phone]   Fax:  [phone]  Name: [name] MRN: [id number] Date of Birth: 03/01/1945

## 2018-10-09 ENCOUNTER — Ambulatory Visit: Payer: Medicare Other | Admitting: Physical Therapy

## 2018-10-11 ENCOUNTER — Other Ambulatory Visit: Payer: Self-pay

## 2018-10-11 ENCOUNTER — Ambulatory Visit: Payer: Medicare Other | Attending: Physical Medicine & Rehabilitation | Admitting: Physical Therapy

## 2018-10-11 ENCOUNTER — Encounter: Payer: Self-pay | Admitting: Physical Therapy

## 2018-10-11 DIAGNOSIS — M79605 Pain in left leg: Secondary | ICD-10-CM | POA: Diagnosis present

## 2018-10-11 DIAGNOSIS — R262 Difficulty in walking, not elsewhere classified: Secondary | ICD-10-CM | POA: Diagnosis present

## 2018-10-11 DIAGNOSIS — R293 Abnormal posture: Secondary | ICD-10-CM | POA: Insufficient documentation

## 2018-10-11 DIAGNOSIS — M6281 Muscle weakness (generalized): Secondary | ICD-10-CM | POA: Diagnosis present

## 2018-10-11 NOTE — Therapy (Signed)
[facility] [address] [place], [place], [address] Phone: [phone]   Fax:  [phone]  Physical Therapy Treatment  Patient Details  Name: [name] MRN: [id number] Date of Birth: November 01, 1945 Referring Provider (PT): Letta Pate Luanna Salk, MD   Encounter Date: 10/11/2018  PT End of Session - 10/11/18 1556    Visit Number  29    Date for PT Re-Evaluation  11/06/18    PT Start Time  1215    PT Stop Time  1300    PT Time Calculation (min)  45 min    Activity Tolerance  Patient tolerated treatment well    Behavior During Therapy  [facility] for tasks assessed/performed       Past Medical History:  Diagnosis Date  . Congenital musculoskeletal deformity of spine   . Hypertension   . Incontinence of bowel 09/2013  . Lumbago   . Lumbosacral spondylosis without myelopathy   . Sleep disorder    uses gabapentin at bedtime   . Spinal stenosis, lumbar region, without neurogenic claudication     Past Surgical History:  Procedure Laterality Date  . ANKLE SURGERY Right 2015   lengthened the achilles   . AUGMENTATION MAMMAPLASTY Bilateral 2000 or 2001 unsure    Left Implant has busted and is no longer visible  . BACK SURGERY  2015   fusion , Dr Patrice Paradise at [facility]; has 2 metal rods in place , fusion from t2 to s1   . BACK SURGERY  01/2017   Dr Rennis Harding ; repair of cracked titanium rod in back lumbar   . bunionectomy  Bilateral 1980s   multiple   . COLONOSCOPY     with polypectomy ; q 5 years   . FOOT SURGERY  07/2011  . SHOULDER SURGERY Left 2013   rotator cuff   . TOTAL HIP ARTHROPLASTY Right 05/08/2017   Procedure: RIGHT TOTAL HIP ARTHROPLASTY ANTERIOR APPROACH;  Surgeon: Paralee Cancel, MD;  Location: [facility];  Service: Orthopedics;  Laterality: Right;  70 mins    There were no vitals filed for this visit.  Subjective Assessment - 10/11/18 1554    Subjective  I way overdid  last Friday with my  stretching and my right side was really painful. I got a muscle relaxor and it is fine now. Center of back is a little sore today.    Currently in Pain?  Yes    Pain Score  3     Pain Location  Back    Pain Orientation  Mid    Pain Descriptors / Indicators  Sore    Aggravating Factors   When i over do it    Pain Relieving Factors  Resting, exercising in the pool.    Multiple Pain Sites  No      Treatment: Aquatic session Water temperature 86.7 degrees F Pt entered and exited the pool via 3 steps with one hand rail.  Seated in chest height water for ankle, knee, and hip AROM with concurrent discussion of status, pain assessment, and what she would like to accomplish today in the pool.  Water walking in mid trunk depth only 1 length of pool today as pt did not want to overdo it as she was recovering from her flare up.   High knee marching 2 lengths of pool with balance focus. Utilized medium pool noodle to help support her trunk/back.   Weight shifting forward- back 10x each side  Decompression float x  2 min with 2 noodles today. VC to contract her core for increased stability.  Single leg stance bil: 10 sec 3x no assistance needed from floatation device.                            PT Short Term Goals - 09/04/18 1448      PT SHORT TERM GOAL #1   Title  ind with initial HEP    Time  2    Period  Weeks    Status  Achieved    Target Date  07/22/18      PT SHORT TERM GOAL #2   Title  Pt will report 25% less hip pain at night    Baseline  Abolished    Time  6    Period  Weeks    Status  Achieved        PT Long Term Goals - 09/25/18 1415      PT LONG TERM GOAL #1   Title  pt will be independent with HEP and consistently doing it on her own at least 5x/week    Time  6    Period  Weeks    Status  On-going    Target Date  11/06/18      PT LONG TERM GOAL #2   Title  Pt will be able to demonstrate hip extension ROM to neutral for improved gait and  upright posture    Baseline  equal Rt and Lt both 10 deg    Time  6    Period  Weeks    Status  On-going    Target Date  11/06/18      PT LONG TERM GOAL #3   Title  pt will report increased walking tolerance to 1 mile    Time  6    Period  Weeks    Status  Revised    Target Date  11/06/18      PT LONG TERM GOAL #4   Title  pt will decrease FOTO to 47% or less    Baseline  53%    Time  6    Period  Weeks    Status  On-going    Target Date  11/06/18      PT LONG TERM GOAL #5   Title  Pt will perform 5 x sit to stand in less than 13 seconds    Baseline  17 sec    Time  6    Period  Weeks    Status  New    Target Date  11/06/18      Additional Long Term Goals   Additional Long Term Goals  Yes      PT LONG TERM GOAL #6   Title  Pt will be able to perform single leg stand x 10 sec on each side    Time  6    Period  Weeks    Status  New    Target Date  11/06/18            Plan - 10/11/18 1556    Clinical Impression Statement  Pt had to cancel her first scheduled appt of the week secondary to "over stretching my side" and having to take a few days of rest. She got a muscle relaxor prescribed to her and once she took her first dose the pain abolished. Pt arrived today for aquatic therapy with some central mid back pain. This  did not increase throughout the session. Pt was able to demonstrate improved ability to balance on one leg in the pool 10 sec ( at least) on each leg and no support of the noodle. pt has decided to take her shoe with the lift back to her orthotist to discuss a more gradual increase in her lift. She has not done so yet.    Personal Factors and Comorbidities  Comorbidity 2    Comorbidities  back surgery, THA, sleep dysfunction    Examination-Activity Limitations  Bed Mobility;Locomotion Level;Sleep    Stability/Clinical Decision Making  Evolving/Moderate complexity    Rehab Potential  Excellent    PT Frequency  2x / week    PT Duration  6 weeks    PT  Treatment/Interventions  ADLs/Self Care Home Management;Biofeedback;Cryotherapy;Electrical Stimulation;Iontophoresis 4mg /ml Dexamethasone;Moist Heat;Gait training;Stair training;Functional mobility training;Therapeutic activities;Therapeutic exercise;Neuromuscular re-education;Manual techniques;Dry needling;Passive range of motion;Taping;Scar mobilization;Aquatic Therapy    PT Next Visit Plan  Dry needling next visit if seen by PT/land therapy.    PT Home Exercise Plan  Access Code: [email or web address]    Consulted and Agree with Plan of Care  Patient       Patient will benefit from skilled therapeutic intervention in order to improve the following deficits and impairments:  Abnormal gait, Difficulty walking, Impaired flexibility, Decreased strength, Decreased range of motion, Pain, Postural dysfunction  Visit Diagnosis: Pain in left leg  Abnormal posture  Difficulty in walking, not elsewhere classified  Muscle weakness (generalized)     Problem List Patient Active Problem List   Diagnosis Date Noted  . Hip contracture, unspecified laterality 07/02/2018  . Overweight (BMI 25.0-29.9) 05/09/2017  . S/P right THA, AA 05/08/2017  . Chronic bilateral low back pain without sciatica 04/05/2015  . Disorder of sacroiliac joint 12/22/2014  . Chronic low back pain 12/22/2014  . Acquired scoliosis 11/27/2011  . Lumbosacral spondylosis without myelopathy 05/22/2011    ,, PTA 10/11/2018, 4:02 PM  [facility] [address], [place], [place], [address] Phone: [phone]   Fax:  [phone]  Name: Ronnita Seja MRN: [id number] Date of Birth: May 05, 1945

## 2018-10-16 ENCOUNTER — Other Ambulatory Visit: Payer: Self-pay

## 2018-10-16 ENCOUNTER — Ambulatory Visit: Payer: Medicare Other | Admitting: Physical Therapy

## 2018-10-16 ENCOUNTER — Encounter: Payer: Self-pay | Admitting: Physical Therapy

## 2018-10-16 DIAGNOSIS — M6281 Muscle weakness (generalized): Secondary | ICD-10-CM

## 2018-10-16 DIAGNOSIS — R262 Difficulty in walking, not elsewhere classified: Secondary | ICD-10-CM

## 2018-10-16 DIAGNOSIS — R293 Abnormal posture: Secondary | ICD-10-CM

## 2018-10-16 DIAGNOSIS — M79605 Pain in left leg: Secondary | ICD-10-CM | POA: Diagnosis not present

## 2018-10-16 NOTE — Therapy (Signed)
[facility] [address] [place], [place], [address] Phone: [phone]   Fax:  [phone]  Physical Therapy Treatment  Patient Details  Name: [name] MRN: [id number] Date of Birth: 06/05/1945 Referring Provider (PT): Letta Pate Luanna Salk, MD   Encounter Date: 10/16/2018  PT End of Session - 10/16/18 1352    Visit Number  30    Date for PT Re-Evaluation  11/06/18    PT Start Time  1351    PT Stop Time  1437    PT Time Calculation (min)  46 min    Activity Tolerance  Patient tolerated treatment well    Behavior During Therapy  [facility] for tasks assessed/performed       Past Medical History:  Diagnosis Date  . Congenital musculoskeletal deformity of spine   . Hypertension   . Incontinence of bowel 09/2013  . Lumbago   . Lumbosacral spondylosis without myelopathy   . Sleep disorder    uses gabapentin at bedtime   . Spinal stenosis, lumbar region, without neurogenic claudication     Past Surgical History:  Procedure Laterality Date  . ANKLE SURGERY Right 2015   lengthened the achilles   . AUGMENTATION MAMMAPLASTY Bilateral 2000 or 2001 unsure    Left Implant has busted and is no longer visible  . BACK SURGERY  2015   fusion , Dr Patrice Paradise at [facility]; has 2 metal rods in place , fusion from t2 to s1   . BACK SURGERY  01/2017   Dr Rennis Harding ; repair of cracked titanium rod in back lumbar   . bunionectomy  Bilateral 1980s   multiple   . COLONOSCOPY     with polypectomy ; q 5 years   . FOOT SURGERY  07/2011  . SHOULDER SURGERY Left 2013   rotator cuff   . TOTAL HIP ARTHROPLASTY Right 05/08/2017   Procedure: RIGHT TOTAL HIP ARTHROPLASTY ANTERIOR APPROACH;  Surgeon: Paralee Cancel, MD;  Location: [facility];  Service: Orthopedics;  Laterality: Right;  70 mins    There were no vitals filed for this visit.  Subjective Assessment - 10/16/18 1354    Subjective  Pt feels great after aquatic therapy. She  walked 1/2 mile 2x yesterday  and chose to walk with her brace as that support feels better.    Limitations  Walking    Currently in Pain?  No/denies    Multiple Pain Sites  No                       [facility] Adult PT Treatment/Exercise - 10/16/18 0001      Lumbar Exercises: Stretches   Other Lumbar Stretch Exercise  Forward flexion stretch 10x with blue ball      Lumbar Exercises: Aerobic   Nustep  L4 x 10 min with concurrent discussion of status      Lumbar Exercises: Seated   Other Seated Lumbar Exercises  Sitting with corrected posture, RTarm lifts overhead 10x    This will replace the Lt side bend home exercise.      Lumbar Exercises: Supine   Clam  20 reps    Clam Limitations  Blue band upgarde, gave pt band for HEP      Knee/Hip Exercises: Stretches   Active Hamstring Stretch  Both;3 reps;20 seconds    Active Hamstring Stretch Limitations  Added to HEP/seated             PT Education -  10/16/18 1407    Education Details  Review of HEP per pt request    Person(s) Educated  Patient    Methods  Explanation    Comprehension  Verbalized understanding       PT Short Term Goals - 09/04/18 1448      PT SHORT TERM GOAL #1   Title  ind with initial HEP    Time  2    Period  Weeks    Status  Achieved    Target Date  07/22/18      PT SHORT TERM GOAL #2   Title  Pt will report 25% less hip pain at night    Baseline  Abolished    Time  6    Period  Weeks    Status  Achieved        PT Long Term Goals - 09/25/18 1415      PT LONG TERM GOAL #1   Title  pt will be independent with HEP and consistently doing it on her own at least 5x/week    Time  6    Period  Weeks    Status  On-going    Target Date  11/06/18      PT LONG TERM GOAL #2   Title  Pt will be able to demonstrate hip extension ROM to neutral for improved gait and upright posture    Baseline  equal Rt and Lt both 10 deg    Time  6    Period  Weeks    Status  On-going    Target  Date  11/06/18      PT LONG TERM GOAL #3   Title  pt will report increased walking tolerance to 1 mile    Time  6    Period  Weeks    Status  Revised    Target Date  11/06/18      PT LONG TERM GOAL #4   Title  pt will decrease FOTO to 47% or less    Baseline  53%    Time  6    Period  Weeks    Status  On-going    Target Date  11/06/18      PT LONG TERM GOAL #5   Title  Pt will perform 5 x sit to stand in less than 13 seconds    Baseline  17 sec    Time  6    Period  Weeks    Status  New    Target Date  11/06/18      Additional Long Term Goals   Additional Long Term Goals  Yes      PT LONG TERM GOAL #6   Title  Pt will be able to perform single leg stand x 10 sec on each side    Time  6    Period  Weeks    Status  New    Target Date  11/06/18            Plan - 10/16/18 1354    Clinical Impression Statement  Pt requested review and organization of her HEP. We spent time verbally reviewing the "whys" of each exercise giving pt clarity as she appraoches  discharge. Seated hamstring stretches were added to HEP especially since pt is walking more.    Personal Factors and Comorbidities  Comorbidity 2    Comorbidities  back surgery, THA, sleep dysfunction    Examination-Activity Limitations  Bed Mobility;Locomotion Level;Sleep    Stability/Clinical  Decision Making  Evolving/Moderate complexity    Rehab Potential  Excellent    PT Frequency  2x / week    PT Duration  6 weeks    PT Treatment/Interventions  ADLs/Self Care Home Management;Biofeedback;Cryotherapy;Electrical Stimulation;Iontophoresis 4mg /ml Dexamethasone;Moist Heat;Gait training;Stair training;Functional mobility training;Therapeutic activities;Therapeutic exercise;Neuromuscular re-education;Manual techniques;Dry needling;Passive range of motion;Taping;Scar mobilization;Aquatic Therapy    PT Next Visit Plan  Aquatics next session    PT Home Exercise Plan  Access Code: [email or web address]    Consulted and Agree with Plan  of Care  Patient       Patient will benefit from skilled therapeutic intervention in order to improve the following deficits and impairments:  Abnormal gait, Difficulty walking, Impaired flexibility, Decreased strength, Decreased range of motion, Pain, Postural dysfunction  Visit Diagnosis: Pain in left leg  Abnormal posture  Difficulty in walking, not elsewhere classified  Muscle weakness (generalized)     Problem List Patient Active Problem List   Diagnosis Date Noted  . Hip contracture, unspecified laterality 07/02/2018  . Overweight (BMI 25.0-29.9) 05/09/2017  . S/P right THA, AA 05/08/2017  . Chronic bilateral low back pain without sciatica 04/05/2015  . Disorder of sacroiliac joint 12/22/2014  . Chronic low back pain 12/22/2014  . Acquired scoliosis 11/27/2011  . Lumbosacral spondylosis without myelopathy 05/22/2011    COCHRAN,JENNIFER, PTA 10/16/2018, 2:36 PM  [facility] [address] [place], [place], [address] Phone: [phone]   Fax:  [phone]  Name: [name] MRN: [id number] Date of Birth: [birth year]  Access Code: [email or web address]  URL: [email or web address]  Date: 10/16/2018  Prepared by: [name]   Exercises  Supine Single Knee to Chest Stretch - 3 reps - 1 sets - 15 hold - 3x daily - 7x weekly  Hip Flexor Stretch with Chair - 3 reps - 1 sets - 15 hold - 2x daily - 7x weekly  Supine Bridge - 3 reps - 1 sets - 2 hold - 2x daily - 7x weekly  Standing Hip Extension - 10 reps - 1 sets - 2x daily - 7x weekly  Hooklying Isometric Clamshell - 10 reps - 1 sets - 2x daily - 7x weekly  Seated Sidebending - 2 reps - 1 sets - 10 hold - 3x daily - 7x weekly  Sidelying Bent Knee Lift at 45 Degrees - 6 reps - 1 sets - 2x daily - 7x weekly  Supine Shoulder Horizontal Abduction with Resistance - 10 reps - 2 sets - 1x daily - 7x weekly  Seated Shoulder Diagonal with Resistance - 10  reps - 2 sets - 1x daily - 7x weekly  Supine Piriformis Stretch with Foot on Ground - 3 reps - 1 sets - 30 hold - 2x daily - 7x weekly  Shoulder extension with resistance - Neutral - 10 reps - 2 sets - 1x daily - 7x weekly  Supine Dead Bug with Leg Extension - 20 reps - 2 sets - 2x daily - 7x weekly  Bilateral Bent Leg Lift - 10 reps - 1 sets - 2x daily - 7x weekly  Supine Multifidus with Heel Press - Leg Bent - 10 reps - 1 sets - 5 hold - 2x daily - 7x weekly  Staggered Stance Forward Backward Weight Shift with Counter Support - 10 reps - 3 sets - 1x daily - 7x weekly  Side to Side Weight Shift with Counter Support - 10 reps - 3 sets - 1x daily - 7x weekly  Seated Hamstring Stretch -  3 reps - 3 sets - 10 hold - 2x daily - 7x weekly

## 2018-10-18 ENCOUNTER — Encounter: Payer: Self-pay | Admitting: Physical Therapy

## 2018-10-18 ENCOUNTER — Other Ambulatory Visit: Payer: Self-pay

## 2018-10-18 ENCOUNTER — Ambulatory Visit: Payer: Medicare Other | Admitting: Physical Therapy

## 2018-10-18 DIAGNOSIS — M6281 Muscle weakness (generalized): Secondary | ICD-10-CM

## 2018-10-18 DIAGNOSIS — R293 Abnormal posture: Secondary | ICD-10-CM

## 2018-10-18 DIAGNOSIS — M79605 Pain in left leg: Secondary | ICD-10-CM | POA: Diagnosis not present

## 2018-10-18 DIAGNOSIS — R262 Difficulty in walking, not elsewhere classified: Secondary | ICD-10-CM

## 2018-10-18 NOTE — Therapy (Signed)
[facility] [address] [place], [place], [address] Phone: [phone]   Fax:  [phone]  Physical Therapy Treatment  Patient Details  Name: [name] MRN: [id number] Date of Birth: Aug 06, 1945 Referring Provider (PT): Letta Pate Luanna Salk, MD   Encounter Date: 10/18/2018  PT End of Session - 10/18/18 1634    Visit Number  31    Date for PT Re-Evaluation  11/06/18    PT Start Time  1215    PT Stop Time  1300    PT Time Calculation (min)  45 min    Activity Tolerance  Patient tolerated treatment well    Behavior During Therapy  [facility] for tasks assessed/performed       Past Medical History:  Diagnosis Date  . Congenital musculoskeletal deformity of spine   . Hypertension   . Incontinence of bowel 09/2013  . Lumbago   . Lumbosacral spondylosis without myelopathy   . Sleep disorder    uses gabapentin at bedtime   . Spinal stenosis, lumbar region, without neurogenic claudication     Past Surgical History:  Procedure Laterality Date  . ANKLE SURGERY Right 2015   lengthened the achilles   . AUGMENTATION MAMMAPLASTY Bilateral 2000 or 2001 unsure    Left Implant has busted and is no longer visible  . BACK SURGERY  2015   fusion , Dr Patrice Paradise at [facility]; has 2 metal rods in place , fusion from t2 to s1   . BACK SURGERY  01/2017   Dr Rennis Harding ; repair of cracked titanium rod in back lumbar   . bunionectomy  Bilateral 1980s   multiple   . COLONOSCOPY     with polypectomy ; q 5 years   . FOOT SURGERY  07/2011  . SHOULDER SURGERY Left 2013   rotator cuff   . TOTAL HIP ARTHROPLASTY Right 05/08/2017   Procedure: RIGHT TOTAL HIP ARTHROPLASTY ANTERIOR APPROACH;  Surgeon: Paralee Cancel, MD;  Location: [facility];  Service: Orthopedics;  Laterality: Right;  70 mins    There were no vitals filed for this visit.  Subjective Assessment - 10/18/18 1634    Subjective  Felt ok after last session. No complaints  today.    Currently in Pain?  No/denies    Multiple Pain Sites  No       Treatment session: AquaticTherapy: Water temp 86.7 degrees F. Pt entered pool and exited via 3 steps with 1 hand rail.  Seated with 75% submersion for AROM ankles, knees, hip with concurrent discussion on status, pain, and treatment goals.  Standing in upper, mid thoracic water depth for water walking. 2 lengths of the pool for each direction. VC to push with greater force during Lt side stepping.  Medium noodle press for increased back extension with concurrent high knee marching, VC to slow speed for longer single leg stance. 2 lengths of pool.  QL stretch LT via long leg stretch utilizing noodle for floatation. 3x 20 sec Hip abduction with VC for greater force to increase resistance 20x bil Single leg stance with medium noodle for greater thoracic extension, RT 3x 20 sec, LT 3x 15 sec ( LOB typically after 15 sec)  Noodle bicycle x 8 min VC to engage core  Black ( thicker) UE water weigghts for shoulder extensions 20x VC for core activation.  PT Short Term Goals - 09/04/18 1448      PT SHORT TERM GOAL #1   Title  ind with initial HEP    Time  2    Period  Weeks    Status  Achieved    Target Date  07/22/18      PT SHORT TERM GOAL #2   Title  Pt will report 25% less hip pain at night    Baseline  Abolished    Time  6    Period  Weeks    Status  Achieved        PT Long Term Goals - 09/25/18 1415      PT LONG TERM GOAL #1   Title  pt will be independent with HEP and consistently doing it on her own at least 5x/week    Time  6    Period  Weeks    Status  On-going    Target Date  11/06/18      PT LONG TERM GOAL #2   Title  Pt will be able to demonstrate hip extension ROM to neutral for improved gait and upright posture    Baseline  equal Rt and Lt both 10 deg    Time  6    Period  Weeks    Status  On-going    Target Date  11/06/18      PT  LONG TERM GOAL #3   Title  pt will report increased walking tolerance to 1 mile    Time  6    Period  Weeks    Status  Revised    Target Date  11/06/18      PT LONG TERM GOAL #4   Title  pt will decrease FOTO to 47% or less    Baseline  53%    Time  6    Period  Weeks    Status  On-going    Target Date  11/06/18      PT LONG TERM GOAL #5   Title  Pt will perform 5 x sit to stand in less than 13 seconds    Baseline  17 sec    Time  6    Period  Weeks    Status  New    Target Date  11/06/18      Additional Long Term Goals   Additional Long Term Goals  Yes      PT LONG TERM GOAL #6   Title  Pt will be able to perform single leg stand x 10 sec on each side    Time  6    Period  Weeks    Status  New    Target Date  11/06/18            Plan - 10/18/18 1635    Clinical Impression Statement  Pt arrives for aquatic therapy today painfree. She reports she is going to keep wearing her new built up shoe and give it some more time. She ambulates without her cane today. She is interested in increasing the resistance today on her Lt hip abductors. She was instructed to push harder against the water to create more resistance. She was able to do this without pain and compensation. Balance on her LTLE improving in the water dut to its support. She could balance > 10 sec but < 20 sec.    Personal Factors and Comorbidities  Comorbidity 2    Comorbidities  back surgery, THA, sleep dysfunction  Examination-Activity Limitations  Bed Mobility;Locomotion Level;Sleep    Stability/Clinical Decision Making  Evolving/Moderate complexity    Rehab Potential  Excellent    PT Frequency  2x / week    PT Duration  6 weeks    PT Treatment/Interventions  ADLs/Self Care Home Management;Biofeedback;Cryotherapy;Electrical Stimulation;Iontophoresis 4mg /ml Dexamethasone;Moist Heat;Gait training;Stair training;Functional mobility training;Therapeutic activities;Therapeutic exercise;Neuromuscular  re-education;Manual techniques;Dry needling;Passive range of motion;Taping;Scar mobilization;Aquatic Therapy    PT Next Visit Plan  Land session next, add resistance to hip abductions LT.    PT Home Exercise Plan  Access Code: [email or web address]    Consulted and Agree with Plan of Care  Patient       Patient will benefit from skilled therapeutic intervention in order to improve the following deficits and impairments:  Abnormal gait, Difficulty walking, Impaired flexibility, Decreased strength, Decreased range of motion, Pain, Postural dysfunction  Visit Diagnosis: Pain in left leg  Abnormal posture  Difficulty in walking, not elsewhere classified  Muscle weakness (generalized)     Problem List Patient Active Problem List   Diagnosis Date Noted  . Hip contracture, unspecified laterality 07/02/2018  . Overweight (BMI 25.0-29.9) 05/09/2017  . S/P right THA, AA 05/08/2017  . Chronic bilateral low back pain without sciatica 04/05/2015  . Disorder of sacroiliac joint 12/22/2014  . Chronic low back pain 12/22/2014  . Acquired scoliosis 11/27/2011  . Lumbosacral spondylosis without myelopathy 05/22/2011    ,, PTA 10/18/2018, 4:40 PM  [facility] [address] [place], [place], [address] Phone: [phone]   Fax:  [phone]  Name: Eryn Mitrovich MRN: [id number] Date of Birth: March 10, 1945

## 2018-10-23 ENCOUNTER — Encounter: Payer: Self-pay | Admitting: Physical Therapy

## 2018-10-23 ENCOUNTER — Other Ambulatory Visit: Payer: Self-pay

## 2018-10-23 ENCOUNTER — Ambulatory Visit: Payer: Medicare Other | Admitting: Physical Therapy

## 2018-10-23 DIAGNOSIS — M6281 Muscle weakness (generalized): Secondary | ICD-10-CM

## 2018-10-23 DIAGNOSIS — R293 Abnormal posture: Secondary | ICD-10-CM

## 2018-10-23 DIAGNOSIS — R262 Difficulty in walking, not elsewhere classified: Secondary | ICD-10-CM

## 2018-10-23 DIAGNOSIS — M79605 Pain in left leg: Secondary | ICD-10-CM | POA: Diagnosis not present

## 2018-10-23 NOTE — Therapy (Signed)
[facility] [address] [place], [place], [address] Phone: [phone]   Fax:  [phone]  Physical Therapy Treatment  Patient Details  Name: Manami Lamia MRN: [id number] Date of Birth: 01-Apr-1945 Referring Provider (PT): Letta Pate Luanna Salk, MD   Encounter Date: 10/23/2018  PT End of Session - 10/23/18 1354    Visit Number  [id number]    Date for PT Re-Evaluation  11/06/18    PT Start Time  1355    PT Stop Time  1443    PT Time Calculation (min)  48 min    Activity Tolerance  Patient tolerated treatment well    Behavior During Therapy  [facility] for tasks assessed/performed       Past Medical History:  Diagnosis Date  . Congenital musculoskeletal deformity of spine   . Hypertension   . Incontinence of bowel 09/2013  . Lumbago   . Lumbosacral spondylosis without myelopathy   . Sleep disorder    uses gabapentin at bedtime   . Spinal stenosis, lumbar region, without neurogenic claudication     Past Surgical History:  Procedure Laterality Date  . ANKLE SURGERY Right 2015   lengthened the achilles   . AUGMENTATION MAMMAPLASTY Bilateral 2000 or 2001 unsure    Left Implant has busted and is no longer visible  . BACK SURGERY  2015   fusion , Dr Patrice Paradise at [facility]; has 2 metal rods in place , fusion from t2 to s1   . BACK SURGERY  01/2017   Dr Rennis Harding ; repair of cracked titanium rod in back lumbar   . bunionectomy  Bilateral 1980s   multiple   . COLONOSCOPY     with polypectomy ; q 5 years   . FOOT SURGERY  07/2011  . SHOULDER SURGERY Left 2013   rotator cuff   . TOTAL HIP ARTHROPLASTY Right 05/08/2017   Procedure: RIGHT TOTAL HIP ARTHROPLASTY ANTERIOR APPROACH;  Surgeon: Paralee Cancel, MD;  Location: [facility];  Service: Orthopedics;  Laterality: Right;  70 mins    There were no vitals filed for this visit.  Subjective Assessment - 10/23/18 1355    Subjective  No complaints today. I am doing more  walking than my home HEP.    Currently in Pain?  No/denies    Multiple Pain Sites  No                       [facility] Adult PT Treatment/Exercise - 10/23/18 0001      Lumbar Exercises: Stretches   Single Knee to Chest Stretch  Right;Left;2 reps;20 seconds    Single Knee to Chest Stretch Limitations  VC to stretch opposite LE long to stretch hip flexor.    Standing Side Bend  --   Sititng and reching her RT arm straight up 10x   Other Lumbar Stretch Exercise  LTLE and RTUE long axis stretch 20 sec     Other Lumbar Stretch Exercise  Forward flexion stretch with blue ball 10x, then to LT 10x       Lumbar Exercises: Aerobic   Nustep  L4 x 12 min with concurrent discussion of status   Pt achieved 1/2 mile     Lumbar Exercises: Supine   Dead Bug  20 reps      Knee/Hip Exercises: Seated   Long Arc Quad  Strengthening;Both;1 set;20 reps;Weights    Long Arc Quad Weight  4 lbs.    [place]  Quad Limitations  Folded towel under RT pelvis       Knee/Hip Exercises: Sidelying   Hip ABduction  Strengthening;Left    Hip ABduction Limitations  0# 10x, 1# 2x10, VC to keep back relaxed and contract side hip more.                PT Short Term Goals - 09/04/18 1448      PT SHORT TERM GOAL #1   Title  ind with initial HEP    Time  2    Period  Weeks    Status  Achieved    Target Date  07/22/18      PT SHORT TERM GOAL #2   Title  Pt will report 25% less hip pain at night    Baseline  Abolished    Time  6    Period  Weeks    Status  Achieved        PT Long Term Goals - 09/25/18 1415      PT LONG TERM GOAL #1   Title  pt will be independent with HEP and consistently doing it on her own at least 5x/week    Time  6    Period  Weeks    Status  On-going    Target Date  11/06/18      PT LONG TERM GOAL #2   Title  Pt will be able to demonstrate hip extension ROM to neutral for improved gait and upright posture    Baseline  equal Rt and Lt both 10 deg    Time  6     Period  Weeks    Status  On-going    Target Date  11/06/18      PT LONG TERM GOAL #3   Title  pt will report increased walking tolerance to 1 mile    Time  6    Period  Weeks    Status  Revised    Target Date  11/06/18      PT LONG TERM GOAL #4   Title  pt will decrease FOTO to 47% or less    Baseline  53%    Time  6    Period  Weeks    Status  On-going    Target Date  11/06/18      PT LONG TERM GOAL #5   Title  Pt will perform 5 x sit to stand in less than 13 seconds    Baseline  17 sec    Time  6    Period  Weeks    Status  New    Target Date  11/06/18      Additional Long Term Goals   Additional Long Term Goals  Yes      PT LONG TERM GOAL #6   Title  Pt will be able to perform single leg stand x 10 sec on each side    Time  6    Period  Weeks    Status  New    Target Date  11/06/18            Plan - 10/23/18 1354    Clinical Impression Statement  Pt walking daily without increased back pain 1/2 to 1 mile. She had not tried adding resisatnce yet at home to her Lt hip abduction. We worked on this during our session today to determine proper level of resistance.    Personal Factors and Comorbidities  Comorbidity 2    Comorbidities  back surgery, THA, sleep dysfunction    Examination-Activity Limitations  Bed Mobility;Locomotion Level;Sleep    Stability/Clinical Decision Making  Evolving/Moderate complexity    Rehab Potential  Excellent    PT Frequency  2x / week    PT Duration  6 weeks    PT Treatment/Interventions  ADLs/Self Care Home Management;Biofeedback;Cryotherapy;Electrical Stimulation;Iontophoresis 4mg /ml Dexamethasone;Moist Heat;Gait training;Stair training;Functional mobility training;Therapeutic activities;Therapeutic exercise;Neuromuscular re-education;Manual techniques;Dry needling;Passive range of motion;Taping;Scar mobilization;Aquatic Therapy    PT Next Visit Plan  Aquatics next session, pt may have to go out of town next week, she will let us  know.    PT Home Exercise Plan  Access Code: [email or web address]    Consulted and Agree with Plan of Care  Patient       Patient will benefit from skilled therapeutic intervention in order to improve the following deficits and impairments:  Abnormal gait, Difficulty walking, Impaired flexibility, Decreased strength, Decreased range of motion, Pain, Postural dysfunction  Visit Diagnosis: Pain in left leg  Abnormal posture  Difficulty in walking, not elsewhere classified  Muscle weakness (generalized)     Problem List Patient Active Problem List   Diagnosis Date Noted  . Hip contracture, unspecified laterality 07/02/2018  . Overweight (BMI 25.0-29.9) 05/09/2017  . S/P right THA, AA 05/08/2017  . Chronic bilateral low back pain without sciatica 04/05/2015  . Disorder of sacroiliac joint 12/22/2014  . Chronic low back pain 12/22/2014  . Acquired scoliosis 11/27/2011  . Lumbosacral spondylosis without myelopathy 05/22/2011    Amalie Koran, PTA 10/23/2018, 2:44 PM  [facility] [address], [place], [place], [address] Phone: [phone]   Fax:  [phone]  Name: Talayshia Urso MRN: [id number] Date of Birth: 16-Feb-1945  Access Code: [email or web address]  URL: [email or web address]  Date: 10/23/2018  Prepared by: [name]   Exercises  Supine Single Knee to Chest Stretch - 3 reps - 1 sets - 15 hold - 3x daily - 7x weekly  Hip Flexor Stretch with Chair - 3 reps - 1 sets - 15 hold - 2x daily - 7x weekly  Supine Bridge - 3 reps - 1 sets - 2 hold - 2x daily - 7x weekly  Standing Hip Extension - 10 reps - 1 sets - 2x daily - 7x weekly  Hooklying Isometric Clamshell - 10 reps - 1 sets - 2x daily - 7x weekly  Seated Sidebending - 2 reps - 1 sets - 10 hold - 3x daily - 7x weekly  Sidelying Bent Knee Lift at 45 Degrees - 6 reps - 1 sets - 2x daily - 7x weekly  Supine Shoulder Horizontal Abduction with Resistance  - 10 reps - 2 sets - 1x daily - 7x weekly  Seated Shoulder Diagonal with Resistance - 10 reps - 2 sets - 1x daily - 7x weekly  Supine Piriformis Stretch with Foot on Ground - 3 reps - 1 sets - 30 hold - 2x daily - 7x weekly  Shoulder extension with resistance - Neutral - 10 reps - 2 sets - 1x daily - 7x weekly  Supine Dead Bug with Leg Extension - 20 reps - 2 sets - 2x daily - 7x weekly  Bilateral Bent Leg Lift - 10 reps - 1 sets - 2x daily - 7x weekly  Supine Multifidus with Heel Press - Leg Bent - 10 reps - 1 sets - 5 hold - 2x daily - 7x weekly  Staggered Stance Forward Backward Weight Shift with Counter Support - 10 reps - 3  sets - 1x daily - 7x weekly  Side to Side Weight Shift with Counter Support - 10 reps - 3 sets - 1x daily - 7x weekly  Seated Hamstring Stretch - 3 reps - 3 sets - 10 hold - 2x daily - 7x weekly  Seated Long Arc Quad - 10 reps - 1 sets - 5 hold - 1x daily - 7x weekly

## 2018-10-25 ENCOUNTER — Ambulatory Visit: Payer: Medicare Other | Admitting: Physical Therapy

## 2018-10-25 ENCOUNTER — Encounter: Payer: Self-pay | Admitting: Physical Therapy

## 2018-10-25 ENCOUNTER — Other Ambulatory Visit: Payer: Self-pay

## 2018-10-25 DIAGNOSIS — M79605 Pain in left leg: Secondary | ICD-10-CM | POA: Diagnosis not present

## 2018-10-25 DIAGNOSIS — R293 Abnormal posture: Secondary | ICD-10-CM

## 2018-10-25 DIAGNOSIS — R262 Difficulty in walking, not elsewhere classified: Secondary | ICD-10-CM

## 2018-10-25 DIAGNOSIS — M6281 Muscle weakness (generalized): Secondary | ICD-10-CM

## 2018-10-25 NOTE — Therapy (Signed)
[facility] [address] [place], [place], [address] Phone: [phone]   Fax:  [phone]  Physical Therapy Treatment  Patient Details  Name: [name] MRN: [id number] Date of Birth: 1945-09-19 Referring Provider (PT): Letta Pate Luanna Salk, MD   Encounter Date: 10/25/2018  PT End of Session - 10/25/18 1606    Visit Number  33    Date for PT Re-Evaluation  11/06/18    PT Start Time  1345    PT Stop Time  1445    PT Time Calculation (min)  60 min    Activity Tolerance  Patient tolerated treatment well    Behavior During Therapy  [facility] for tasks assessed/performed       Past Medical History:  Diagnosis Date  . Congenital musculoskeletal deformity of spine   . Hypertension   . Incontinence of bowel 09/2013  . Lumbago   . Lumbosacral spondylosis without myelopathy   . Sleep disorder    uses gabapentin at bedtime   . Spinal stenosis, lumbar region, without neurogenic claudication     Past Surgical History:  Procedure Laterality Date  . ANKLE SURGERY Right 2015   lengthened the achilles   . AUGMENTATION MAMMAPLASTY Bilateral 2000 or 2001 unsure    Left Implant has busted and is no longer visible  . BACK SURGERY  2015   fusion , Dr Patrice Paradise at [facility]; has 2 metal rods in place , fusion from t2 to s1   . BACK SURGERY  01/2017   Dr Rennis Harding ; repair of cracked titanium rod in back lumbar   . bunionectomy  Bilateral 1980s   multiple   . COLONOSCOPY     with polypectomy ; q 5 years   . FOOT SURGERY  07/2011  . SHOULDER SURGERY Left 2013   rotator cuff   . TOTAL HIP ARTHROPLASTY Right 05/08/2017   Procedure: RIGHT TOTAL HIP ARTHROPLASTY ANTERIOR APPROACH;  Surgeon: Paralee Cancel, MD;  Location: [facility];  Service: Orthopedics;  Laterality: Right;  70 mins    There were no vitals filed for this visit.  Subjective Assessment - 10/25/18 1604    Subjective  My hips were good after last session but  my Rt side was really sore. I think I stretched too much or too hard.    Currently in Pain?  Yes    Pain Score  3     Pain Location  Back    Pain Orientation  Right;Lateral    Pain Descriptors / Indicators  Sore    Aggravating Factors   Overdoing my stretching    Pain Relieving Factors  being in the pool, resting    Multiple Pain Sites  No      Treatment: Aquatic therapy: water temp 86.7 degrees F Pt exited and entered pool via 3 steps with 1 hand rail.  Seated with 75% submersion for ankle, knee, and hip AROM to prepare for bigger movements and discuss her pain and treatment goals.   2 lengths of mid thoracic depth water walking, pt slower than usual but no pain if pt moves slow. Pt required medium size noodle on side stepping to press down and support her back to help decrease her back pain.   Decompression float x 1 min with noodle behind pt, RTLE long leg/long axis stretch followed by Bil LE float to the Lt to stretch her RT side body. Multiple reps with longer holds.  High knee marching with thoracic extension  via noodle 3 lengths of the pool.  Noodle bicycle x 8 min  End of session with 2 min decompression float.                           PT Short Term Goals - 09/04/18 1448      PT SHORT TERM GOAL #1   Title  ind with initial HEP    Time  2    Period  Weeks    Status  Achieved    Target Date  07/22/18      PT SHORT TERM GOAL #2   Title  Pt will report 25% less hip pain at night    Baseline  Abolished    Time  6    Period  Weeks    Status  Achieved        PT Long Term Goals - 09/25/18 1415      PT LONG TERM GOAL #1   Title  pt will be independent with HEP and consistently doing it on her own at least 5x/week    Time  6    Period  Weeks    Status  On-going    Target Date  11/06/18      PT LONG TERM GOAL #2   Title  Pt will be able to demonstrate hip extension ROM to neutral for improved gait and upright posture    Baseline  equal  Rt and Lt both 10 deg    Time  6    Period  Weeks    Status  On-going    Target Date  11/06/18      PT LONG TERM GOAL #3   Title  pt will report increased walking tolerance to 1 mile    Time  6    Period  Weeks    Status  Revised    Target Date  11/06/18      PT LONG TERM GOAL #4   Title  pt will decrease FOTO to 47% or less    Baseline  53%    Time  6    Period  Weeks    Status  On-going    Target Date  11/06/18      PT LONG TERM GOAL #5   Title  Pt will perform 5 x sit to stand in less than 13 seconds    Baseline  17 sec    Time  6    Period  Weeks    Status  New    Target Date  11/06/18      Additional Long Term Goals   Additional Long Term Goals  Yes      PT LONG TERM GOAL #6   Title  Pt will be able to perform single leg stand x 10 sec on each side    Time  6    Period  Weeks    Status  New    Target Date  11/06/18            Plan - 10/25/18 1607    Clinical Impression Statement  Pt arrives for aquatic therapy today with complaints and some cautious movements secondary to Rt lateral spine pain. This decreased in the water with gentle stretching and decomprssion using the bouyancy and support of the water. Pain was abolished at the end of this session. Pt was not able to generate resistance/current due to her pain. Slow movements were better for pt today she  could complete her exercises without increasing her pain.    Personal Factors and Comorbidities  Comorbidity 2    Comorbidities  back surgery, THA, sleep dysfunction    Examination-Activity Limitations  Bed Mobility;Locomotion Level;Sleep    Stability/Clinical Decision Making  Evolving/Moderate complexity    Rehab Potential  Excellent    PT Frequency  2x / week    PT Duration  6 weeks    PT Treatment/Interventions  ADLs/Self Care Home Management;Biofeedback;Cryotherapy;Electrical Stimulation;Iontophoresis 4mg /ml Dexamethasone;Moist Heat;Gait training;Stair training;Functional mobility  training;Therapeutic activities;Therapeutic exercise;Neuromuscular re-education;Manual techniques;Dry needling;Passive range of motion;Taping;Scar mobilization;Aquatic Therapy    PT Next Visit Plan  Continue to work with resistance for LT hip strength on land    PT Home Exercise Plan  Access Code: [email or web address]    Consulted and Agree with Plan of Care  Patient       Patient will benefit from skilled therapeutic intervention in order to improve the following deficits and impairments:  Abnormal gait, Difficulty walking, Impaired flexibility, Decreased strength, Decreased range of motion, Pain, Postural dysfunction  Visit Diagnosis: Pain in left leg  Abnormal posture  Difficulty in walking, not elsewhere classified  Muscle weakness (generalized)     Problem List Patient Active Problem List   Diagnosis Date Noted  . Hip contracture, unspecified laterality 07/02/2018  . Overweight (BMI 25.0-29.9) 05/09/2017  . S/P right THA, AA 05/08/2017  . Chronic bilateral low back pain without sciatica 04/05/2015  . Disorder of sacroiliac joint 12/22/2014  . Chronic low back pain 12/22/2014  . Acquired scoliosis 11/27/2011  . Lumbosacral spondylosis without myelopathy 05/22/2011    Tailyn Hantz, PTA 10/25/2018, 4:14 PM  [facility] [address] [place], [place], [address] Phone: [phone]   Fax:  [phone]  Name: Nioma Prusha MRN: [id number] Date of Birth: 1945/09/06

## 2018-10-30 ENCOUNTER — Other Ambulatory Visit: Payer: Self-pay

## 2018-10-30 ENCOUNTER — Ambulatory Visit: Payer: Medicare Other | Admitting: Physical Therapy

## 2018-10-30 ENCOUNTER — Encounter: Payer: Self-pay | Admitting: Physical Therapy

## 2018-10-30 DIAGNOSIS — M79605 Pain in left leg: Secondary | ICD-10-CM | POA: Diagnosis not present

## 2018-10-30 DIAGNOSIS — R293 Abnormal posture: Secondary | ICD-10-CM

## 2018-10-30 DIAGNOSIS — M6281 Muscle weakness (generalized): Secondary | ICD-10-CM

## 2018-10-30 DIAGNOSIS — R262 Difficulty in walking, not elsewhere classified: Secondary | ICD-10-CM

## 2018-10-30 NOTE — Therapy (Signed)
[facility] [address] [place], [place], [address] Phone: [phone]   Fax:  [phone]  Physical Therapy Treatment  Patient Details  Name: [name] MRN: [id number] Date of Birth: December 17, 1945 Referring Provider (PT): Letta Pate Luanna Salk, MD   Encounter Date: 10/30/2018  PT End of Session - 10/30/18 1417    Visit Number  34    Date for PT Re-Evaluation  11/06/18    PT Start Time  1414    PT Stop Time  1455    PT Time Calculation (min)  41 min    Activity Tolerance  Patient tolerated treatment well    Behavior During Therapy  [facility] for tasks assessed/performed       Past Medical History:  Diagnosis Date  . Congenital musculoskeletal deformity of spine   . Hypertension   . Incontinence of bowel 09/2013  . Lumbago   . Lumbosacral spondylosis without myelopathy   . Sleep disorder    uses gabapentin at bedtime   . Spinal stenosis, lumbar region, without neurogenic claudication     Past Surgical History:  Procedure Laterality Date  . ANKLE SURGERY Right 2015   lengthened the achilles   . AUGMENTATION MAMMAPLASTY Bilateral 2000 or 2001 unsure    Left Implant has busted and is no longer visible  . BACK SURGERY  2015   fusion , Dr Patrice Paradise at [facility]; has 2 metal rods in place , fusion from t2 to s1   . BACK SURGERY  01/2017   Dr Rennis Harding ; repair of cracked titanium rod in back lumbar   . bunionectomy  Bilateral 1980s   multiple   . COLONOSCOPY     with polypectomy ; q 5 years   . FOOT SURGERY  07/2011  . SHOULDER SURGERY Left 2013   rotator cuff   . TOTAL HIP ARTHROPLASTY Right 05/08/2017   Procedure: RIGHT TOTAL HIP ARTHROPLASTY ANTERIOR APPROACH;  Surgeon: Paralee Cancel, MD;  Location: [facility];  Service: Orthopedics;  Laterality: Right;  70 mins    There were no vitals filed for this visit.                    [facility] Adult PT Treatment/Exercise - 10/30/18 0001      Lumbar Exercises: Aerobic   Nustep  L4 x 12 min end of session   Pt achieved 1/2 mile     Knee/Hip Exercises: Standing   Hip Flexion  Stengthening;Both;1 set;20 reps;Knee bent    Hip Flexion Limitations  Blue band    Hip Extension  Stengthening;Left;2 sets;10 reps;Knee straight    Extension Limitations  Blue band    Other Standing Knee Exercises  Blue band side stepping  4x along counter top      Knee/Hip Exercises: Seated   Long Arc Quad  Strengthening;Both;1 set;15 reps;Weights    Long Arc Quad Weight  4 lbs.    Long Arc Quad Limitations  Folded towel under RT pelvis       Knee/Hip Exercises: Sidelying   Hip ABduction  Strengthening;Left;2 sets;10 reps    Hip ABduction Limitations  2# 2x10                PT Short Term Goals - 09/04/18 1448      PT SHORT TERM GOAL #1   Title  ind with initial HEP    Time  2    Period  Weeks    Status  Achieved  Target Date  07/22/18      PT SHORT TERM GOAL #2   Title  Pt will report 25% less hip pain at night    Baseline  Abolished    Time  6    Period  Weeks    Status  Achieved        PT Long Term Goals - 09/25/18 1415      PT LONG TERM GOAL #1   Title  pt will be independent with HEP and consistently doing it on her own at least 5x/week    Time  6    Period  Weeks    Status  On-going    Target Date  11/06/18      PT LONG TERM GOAL #2   Title  Pt will be able to demonstrate hip extension ROM to neutral for improved gait and upright posture    Baseline  equal Rt and Lt both 10 deg    Time  6    Period  Weeks    Status  On-going    Target Date  11/06/18      PT LONG TERM GOAL #3   Title  pt will report increased walking tolerance to 1 mile    Time  6    Period  Weeks    Status  Revised    Target Date  11/06/18      PT LONG TERM GOAL #4   Title  pt will decrease FOTO to 47% or less    Baseline  53%    Time  6    Period  Weeks    Status  On-going    Target Date  11/06/18      PT LONG TERM GOAL #5    Title  Pt will perform 5 x sit to stand in less than 13 seconds    Baseline  17 sec    Time  6    Period  Weeks    Status  New    Target Date  11/06/18      Additional Long Term Goals   Additional Long Term Goals  Yes      PT LONG TERM GOAL #6   Title  Pt will be able to perform single leg stand x 10 sec on each side    Time  6    Period  Weeks    Status  New    Target Date  11/06/18            Plan - 10/30/18 1417    Clinical Impression Statement  Pt able to lift heavier weights today for her LT hip strength. She is concerned she will " never feel like I am strong in my Lt hip and back." PTA suggested pt choose to walk with her cane but pt is heavily against using it on the regular. PTA pointed out to patient that te quality of her Lt hip movement is so much improved than previous when she could compensate with her QL consistently. Pt has no issue with pain and doing her exercises. Pt does report she has some high levels of personal stress at home.    Personal Factors and Comorbidities  Comorbidity 2    Comorbidities  back surgery, THA, sleep dysfunction    Examination-Activity Limitations  Bed Mobility;Locomotion Level;Sleep    Stability/Clinical Decision Making  Evolving/Moderate complexity    Rehab Potential  Excellent    PT Frequency  2x / week    PT Duration  6 weeks    PT Treatment/Interventions  ADLs/Self Care Home Management;Biofeedback;Cryotherapy;Electrical Stimulation;Iontophoresis 4mg /ml Dexamethasone;Moist Heat;Gait training;Stair training;Functional mobility training;Therapeutic activities;Therapeutic exercise;Neuromuscular re-education;Manual techniques;Dry needling;Passive range of motion;Taping;Scar mobilization;Aquatic Therapy    PT Next Visit Plan  Aquatic session next    PT Home Exercise Plan  Access Code: [email or web address]    Consulted and Agree with Plan of Care  Patient       Patient will benefit from skilled therapeutic intervention in order to improve the  following deficits and impairments:  Abnormal gait, Difficulty walking, Impaired flexibility, Decreased strength, Decreased range of motion, Pain, Postural dysfunction  Visit Diagnosis: Pain in left leg  Abnormal posture  Difficulty in walking, not elsewhere classified  Muscle weakness (generalized)     Problem List Patient Active Problem List   Diagnosis Date Noted  . Hip contracture, unspecified laterality 07/02/2018  . Overweight (BMI 25.0-29.9) 05/09/2017  . S/P right THA, AA 05/08/2017  . Chronic bilateral low back pain without sciatica 04/05/2015  . Disorder of sacroiliac joint 12/22/2014  . Chronic low back pain 12/22/2014  . Acquired scoliosis 11/27/2011  . Lumbosacral spondylosis without myelopathy 05/22/2011    ,, PTA 10/30/2018, 3:05 PM  [facility] [address], [place], [place], [address] Phone: [phone]   Fax:  [phone]  Name: [name] MRN: [id number] Date of Birth: [birth year]

## 2018-11-01 ENCOUNTER — Encounter: Payer: Self-pay | Admitting: Physical Therapy

## 2018-11-01 ENCOUNTER — Other Ambulatory Visit: Payer: Self-pay

## 2018-11-01 ENCOUNTER — Ambulatory Visit: Payer: Medicare Other | Admitting: Physical Therapy

## 2018-11-01 DIAGNOSIS — R293 Abnormal posture: Secondary | ICD-10-CM

## 2018-11-01 DIAGNOSIS — M79605 Pain in left leg: Secondary | ICD-10-CM

## 2018-11-01 DIAGNOSIS — M6281 Muscle weakness (generalized): Secondary | ICD-10-CM

## 2018-11-01 DIAGNOSIS — R262 Difficulty in walking, not elsewhere classified: Secondary | ICD-10-CM

## 2018-11-01 NOTE — Therapy (Signed)
[facility] [address], [place], [place], [address] Phone: [phone]   Fax:  [phone]  Physical Therapy Treatment  Patient Details  Name: [name] MRN: [id number] Date of Birth: September 26, 1945 Referring Provider (PT): Letta Pate Luanna Salk, MD   Encounter Date: 11/01/2018  PT End of Session - 11/01/18 1525    Visit Number  [id number]    Date for PT Re-Evaluation  11/06/18    PT Start Time  1215    PT Stop Time  1305    PT Time Calculation (min)  50 min    Activity Tolerance  Patient tolerated treatment well    Behavior During Therapy  [facility] for tasks assessed/performed       Past Medical History:  Diagnosis Date  . Congenital musculoskeletal deformity of spine   . Hypertension   . Incontinence of bowel 09/2013  . Lumbago   . Lumbosacral spondylosis without myelopathy   . Sleep disorder    uses gabapentin at bedtime   . Spinal stenosis, lumbar region, without neurogenic claudication     Past Surgical History:  Procedure Laterality Date  . ANKLE SURGERY Right 2015   lengthened the achilles   . AUGMENTATION MAMMAPLASTY Bilateral 2000 or 2001 unsure    Left Implant has busted and is no longer visible  . BACK SURGERY  2015   fusion , Dr Patrice Paradise at [facility]; has 2 metal rods in place , fusion from t2 to s1   . BACK SURGERY  01/2017   Dr Rennis Harding ; repair of cracked titanium rod in back lumbar   . bunionectomy  Bilateral 1980s   multiple   . COLONOSCOPY     with polypectomy ; q 5 years   . FOOT SURGERY  07/2011  . SHOULDER SURGERY Left 2013   rotator cuff   . TOTAL HIP ARTHROPLASTY Right 05/08/2017   Procedure: RIGHT TOTAL HIP ARTHROPLASTY ANTERIOR APPROACH;  Surgeon: Paralee Cancel, MD;  Location: [facility];  Service: Orthopedics;  Laterality: Right;  70 mins    There were no vitals filed for this visit.  Subjective Assessment - 11/01/18 1524    Subjective  I did fine after my last session in the  clinic. I have not spoken with anyone about my shoe yet. Maybe I should speak with Dr Patrice Paradise about expectations.    Currently in Pain?  No/denies    Multiple Pain Sites  No     Treatment session: Aquatic session: Pool temperature 86.7 degrees F Pt entered and exited pool via 3 steps and 2 hand rails.  Seated with 75% submersion: A/ROM ankles, knees, hips with concurrent discussion of her status and pain.  Water walking mid thoracic depth: 4 lengths of each directions. VC to walk faster and use her UE more for more work/output.  Mid thoracic stretch with RTLE on wall, pt holding onto pool edge. 30 sec holds in between each direction change for water walking. High knee marching with downward press on noodle for thoracic extension. 2 lengths. Then, 2 lengths where pt had to single leg stance every 3-5 steps. VC for "lifting the heart" for thoracic extension.   RT side body stretches: noodle behind pt as she brings the RTLE to the LT 10x.   Water bicycle with noodle x6 min, VC to contract core more Poster Pelvic tilt against the wall in mini squat, then downward press on noodle for core. Hold 5 sec 10x  PT Short Term Goals - 09/04/18 1448      PT SHORT TERM GOAL #1   Title  ind with initial HEP    Time  2    Period  Weeks    Status  Achieved    Target Date  07/22/18      PT SHORT TERM GOAL #2   Title  Pt will report 25% less hip pain at night    Baseline  Abolished    Time  6    Period  Weeks    Status  Achieved        PT Long Term Goals - 11/01/18 1527      PT LONG TERM GOAL #1   Title  pt will be independent with HEP and consistently doing it on her own at least 5x/week    Time  6    Period  Weeks    Status  Achieved            Plan - 11/01/18 1528    Clinical Impression Statement  Pt arrives for aquatic session today without complaints of pain. She does report her back felt "tight" throughout her session. Pt  continues exercise with greater ease and better alignment in the water due to the waters bouyancy. We discussed how pt was was going to implement water exercise as her pool is currently not open due to Covid 19. Pt able to demonstrate single leg stance > 10 sec in the water but we will assess this on the land next week for goal attainment. Pt continues to walk with her shoe built up and has not Curator or MD to discuss whether or not she should continue to wear it or have it modified.    Personal Factors and Comorbidities  Comorbidity 2    Comorbidities  back surgery, THA, sleep dysfunction    Examination-Activity Limitations  Bed Mobility;Locomotion Level;Sleep    Stability/Clinical Decision Making  Evolving/Moderate complexity    Rehab Potential  Excellent    PT Frequency  2x / week    PT Duration  6 weeks    PT Treatment/Interventions  ADLs/Self Care Home Management;Biofeedback;Cryotherapy;Electrical Stimulation;Iontophoresis 4mg /ml Dexamethasone;Moist Heat;Gait training;Stair training;Functional mobility training;Therapeutic activities;Therapeutic exercise;Neuromuscular re-education;Manual techniques;Dry needling;Passive range of motion;Taping;Scar mobilization;Aquatic Therapy    PT Next Visit Plan  Land session next, Assess single leg balance for goals.    PT Home Exercise Plan  Access Code: [email or web address]    Consulted and Agree with Plan of Care  Patient       Patient will benefit from skilled therapeutic intervention in order to improve the following deficits and impairments:  Abnormal gait, Difficulty walking, Impaired flexibility, Decreased strength, Decreased range of motion, Pain, Postural dysfunction  Visit Diagnosis: Pain in left leg  Abnormal posture  Difficulty in walking, not elsewhere classified  Muscle weakness (generalized)     Problem List Patient Active Problem List   Diagnosis Date Noted  . Hip contracture, unspecified laterality 07/02/2018  . Overweight  (BMI 25.0-29.9) 05/09/2017  . S/P right THA, AA 05/08/2017  . Chronic bilateral low back pain without sciatica 04/05/2015  . Disorder of sacroiliac joint 12/22/2014  . Chronic low back pain 12/22/2014  . Acquired scoliosis 11/27/2011  . Lumbosacral spondylosis without myelopathy 05/22/2011    Richardine Peppers, PTA 11/01/2018, 3:35 PM  [facility] [address] [place], [place], [address] Phone: [phone]   Fax:  [phone]  Name: [name] MRN: [id number] Date of Birth: 1945-08-21

## 2018-11-06 ENCOUNTER — Ambulatory Visit: Payer: Medicare Other | Admitting: Physical Therapy

## 2018-11-06 ENCOUNTER — Other Ambulatory Visit: Payer: Self-pay

## 2018-11-06 DIAGNOSIS — R293 Abnormal posture: Secondary | ICD-10-CM

## 2018-11-06 DIAGNOSIS — M79605 Pain in left leg: Secondary | ICD-10-CM | POA: Diagnosis not present

## 2018-11-06 DIAGNOSIS — R262 Difficulty in walking, not elsewhere classified: Secondary | ICD-10-CM

## 2018-11-06 DIAGNOSIS — M6281 Muscle weakness (generalized): Secondary | ICD-10-CM

## 2018-11-06 NOTE — Therapy (Signed)
[facility] [address] [place], [place], [address] Phone: [phone]   Fax:  [phone]  Physical Therapy Treatment  Patient Details  Name: [name] MRN: [id number] Date of Birth: Apr 18, 1945 Referring Provider (PT): Letta Pate Luanna Salk, MD   Encounter Date: 11/06/2018  PT End of Session - 11/06/18 1419    Visit Number  36    Date for PT Re-Evaluation  11/08/18    PT Start Time  1401    PT Stop Time  1443    PT Time Calculation (min)  42 min    Activity Tolerance  Patient tolerated treatment well    Behavior During Therapy  [facility] for tasks assessed/performed       Past Medical History:  Diagnosis Date  . Congenital musculoskeletal deformity of spine   . Hypertension   . Incontinence of bowel 09/2013  . Lumbago   . Lumbosacral spondylosis without myelopathy   . Sleep disorder    uses gabapentin at bedtime   . Spinal stenosis, lumbar region, without neurogenic claudication     Past Surgical History:  Procedure Laterality Date  . ANKLE SURGERY Right 2015   lengthened the achilles   . AUGMENTATION MAMMAPLASTY Bilateral 2000 or 2001 unsure    Left Implant has busted and is no longer visible  . BACK SURGERY  2015   fusion , Dr Patrice Paradise at [facility]; has 2 metal rods in place , fusion from t2 to s1   . BACK SURGERY  01/2017   Dr Rennis Harding ; repair of cracked titanium rod in back lumbar   . bunionectomy  Bilateral 1980s   multiple   . COLONOSCOPY     with polypectomy ; q 5 years   . FOOT SURGERY  07/2011  . SHOULDER SURGERY Left 2013   rotator cuff   . TOTAL HIP ARTHROPLASTY Right 05/08/2017   Procedure: RIGHT TOTAL HIP ARTHROPLASTY ANTERIOR APPROACH;  Surgeon: Paralee Cancel, MD;  Location: [facility];  Service: Orthopedics;  Laterality: Right;  70 mins    There were no vitals filed for this visit.  Subjective Assessment - 11/06/18 1506    Subjective  Pt ambulates into clinic without cane  and looks overall, much improved with about 50% less Trendelenburg.  Pt states she feels stronger and still getting used to the shoe lift    How long can you walk comfortably?  1.5 miles    Patient Stated Goals  be able to go for walks again    Currently in Pain?  No/denies         [facility] PT Assessment - 11/06/18 0001      Assessment   Medical Diagnosis  M24.559 (ICD-10-CM) - Hip flexor tightness, unspecified laterality    Referring Provider (PT)  Kirsteins, Luanna Salk, MD      Observation/Other Assessments   Focus on Therapeutic Outcomes (FOTO)   31% limited      Balance   Balance Assessed  Yes      Static Standing Balance   Static Standing Balance -  Activities   Single Leg Stance - Right Leg;Single Leg Stance - Left Leg   10+ seconds bilateral     Standardized Balance Assessment   Five times sit to stand comments   12 sec                   [facility] Adult PT Treatment/Exercise - 11/06/18 0001      Lumbar Exercises: Aerobic  Recumbent Bike  L4 x 10 min - PT present for FOTO and status update      Lumbar Exercises: Standing   Other Standing Lumbar Exercises  hip abduction blue band around knees      Lumbar Exercises: Seated   Other Seated Lumbar Exercises  hip abduction blue band    Other Seated Lumbar Exercises  hip flexion blue band; 20x; flexion/abd/add - simulated in and out of car - 20x      Knee/Hip Exercises: Seated   Sit to Sand  5 reps;without UE support      Knee/Hip Exercises: Sidelying   Hip ABduction  Strengthening;Left;2 sets;10 reps   checked for form   Hip ABduction Limitations  abd and extension with knee bent - 20x               PT Short Term Goals - 09/04/18 1448      PT SHORT TERM GOAL #1   Title  ind with initial HEP    Time  2    Period  Weeks    Status  Achieved    Target Date  07/22/18      PT SHORT TERM GOAL #2   Title  Pt will report 25% less hip pain at night    Baseline  Abolished    Time  6    Period  Weeks     Status  Achieved        PT Long Term Goals - 11/06/18 1408      PT LONG TERM GOAL #1   Title  pt will be independent with HEP and consistently doing it on her own at least 5x/week    Baseline  2 exercises added today, one more visit to finalize for discharge    Status  Partially Met      PT LONG TERM GOAL #2   Title  Pt will be able to demonstrate hip extension ROM to neutral for improved gait and upright posture    Status  On-going      PT LONG TERM GOAL #3   Title  pt will report increased walking tolerance to 1 mile    Baseline  pt is walking 1.5 miles    Status  Achieved      PT LONG TERM GOAL #4   Title  pt will decrease FOTO to 47% or less    Baseline  31%    Status  Achieved      PT LONG TERM GOAL #5   Title  Pt will perform 5 x sit to stand in less than 13 seconds    Baseline  12 sec    Status  Achieved      PT LONG TERM GOAL #6   Title  Pt will be able to perform single leg stand x 10 sec on each side    Baseline  can stand over 10 seconds on both sides    Status  Achieved            Plan - 11/06/18 1423    Clinical Impression Statement  Pt has met balance and 5x sit to stand goals.  She has made significant porgress based on FOTO score which has imporved to 31% limited. Pt has one remaining goal and has not quite finished out aquatic therapy plan at this time. During today's treatment there were two additional exercises that were added to HEP so she can continue to make more porgress on her own as she is  expected to continue to be able to work on gait improvements.  She will benefit from one more aquatic treatment to ensure maximum benefit and ability to successfully discharge with HEP.    PT Treatment/Interventions  ADLs/Self Care Home Management;Biofeedback;Cryotherapy;Electrical Stimulation;Iontophoresis 78m/ml Dexamethasone;Moist Heat;Gait training;Stair training;Functional mobility training;Therapeutic activities;Therapeutic exercise;Neuromuscular  re-education;Manual techniques;Dry needling;Passive range of motion;Taping;Scar mobilization;Aquatic Therapy    PT Next Visit Plan  one more aquatic session to wrap up anything she can continue to do at home, hip strength and ROM, ensure there are no issues in performing exericses in her HEP    PT Home Exercise Plan  Access Code: [email or web address]   Consulted and Agree with Plan of Care  Patient       Patient will benefit from skilled therapeutic intervention in order to improve the following deficits and impairments:  Abnormal gait, Difficulty walking, Impaired flexibility, Decreased strength, Decreased range of motion, Pain, Postural dysfunction  Visit Diagnosis: Pain in left leg  Abnormal posture  Difficulty in walking, not elsewhere classified  Muscle weakness (generalized)     Problem List Patient Active Problem List   Diagnosis Date Noted  . Hip contracture, unspecified laterality 07/02/2018  . Overweight (BMI 25.0-29.9) 05/09/2017  . S/P right THA, AA 05/08/2017  . Chronic bilateral low back pain without sciatica 04/05/2015  . Disorder of sacroiliac joint 12/22/2014  . Chronic low back pain 12/22/2014  . Acquired scoliosis 11/27/2011  . Lumbosacral spondylosis without myelopathy 05/22/2011    JJule Ser PT 11/06/2018, 3:13 PM  [facility] [address] [place] [address]Phone: [phone]  Fax:  [phone] Name: Suzanne ChestnuttMRN: [id number] of Birth: [birth year]

## 2018-11-08 ENCOUNTER — Encounter: Payer: Self-pay | Admitting: Physical Therapy

## 2018-11-08 ENCOUNTER — Ambulatory Visit: Payer: Medicare Other | Admitting: Physical Therapy

## 2018-11-08 ENCOUNTER — Ambulatory Visit: Payer: Medicare Other | Admitting: Physical Medicine & Rehabilitation

## 2018-11-08 ENCOUNTER — Other Ambulatory Visit: Payer: Self-pay

## 2018-11-08 DIAGNOSIS — M79605 Pain in left leg: Secondary | ICD-10-CM

## 2018-11-08 DIAGNOSIS — M6281 Muscle weakness (generalized): Secondary | ICD-10-CM

## 2018-11-08 DIAGNOSIS — R293 Abnormal posture: Secondary | ICD-10-CM

## 2018-11-08 DIAGNOSIS — R262 Difficulty in walking, not elsewhere classified: Secondary | ICD-10-CM

## 2018-11-08 NOTE — Therapy (Addendum)
[facility] [address] [place], [place], [address] Phone: [phone]   Fax:  [phone]  Physical Therapy Treatment  Patient Details  Name: [name] MRN: [id number] Date of Birth: [birth year] Referring Provider (PT): Letta Pate Luanna Salk, MD   Encounter Date: 11/08/2018  PT End of Session - 11/08/18 1556    Visit Number  37    Date for PT Re-Evaluation  11/08/18    PT Start Time  1300    PT Stop Time  1345    PT Time Calculation (min)  45 min    Activity Tolerance  Patient tolerated treatment well    Behavior During Therapy  --   Moments of intense crying due to loss of pet yesterday      Past Medical History:  Diagnosis Date  . Congenital musculoskeletal deformity of spine   . Hypertension   . Incontinence of bowel 09/2013  . Lumbago   . Lumbosacral spondylosis without myelopathy   . Sleep disorder    uses gabapentin at bedtime   . Spinal stenosis, lumbar region, without neurogenic claudication     Past Surgical History:  Procedure Laterality Date  . ANKLE SURGERY Right 2015   lengthened the achilles   . AUGMENTATION MAMMAPLASTY Bilateral 2000 or 2001 unsure    Left Implant has busted and is no longer visible  . BACK SURGERY  2015   fusion , Dr Patrice Paradise at [facility]; has 2 metal rods in place , fusion from t2 to s1   . BACK SURGERY  01/2017   Dr Rennis Harding ; repair of cracked titanium rod in back lumbar   . bunionectomy  Bilateral 1980s   multiple   . COLONOSCOPY     with polypectomy ; q 5 years   . FOOT SURGERY  07/2011  . SHOULDER SURGERY Left 2013   rotator cuff   . TOTAL HIP ARTHROPLASTY Right 05/08/2017   Procedure: RIGHT TOTAL HIP ARTHROPLASTY ANTERIOR APPROACH;  Surgeon: Paralee Cancel, MD;  Location: [facility];  Service: Orthopedics;  Laterality: Right;  70 mins    There were no vitals filed for this visit.  Subjective Assessment - 11/08/18 1603    Subjective  Pt arrives  teary after just loosing her pet yesterday. Pain today is greatly emotional.    Currently in Pain?  No/denies      Treatment: Aquatic session Water temperature 86.7 degrees F Pt entered water via 3 steps with 1 hand rail  Seated at bench 75% submersion for pt to gather herself and decide if she could focus on the exercises as she was very emotional. Performed AROM 20x each in multiple planes for ankle, knee and hip.  Double knee to chest 10x slowly seated  Slow water walking in mid trunk depth 2 lengths of each direction  8 min slow LE bicycle with noodle behind pt then LE float stretch for lower trunk 5x bil  High knee marching with noodle press 2 lengths of pool. VC to lift heart for > thoracic extension.   Standing hip abduction 20x bil, extension 20x bil                           PT Short Term Goals - 09/04/18 1448      PT SHORT TERM GOAL #1   Title  ind with initial HEP    Time  2    Period  Weeks  Status  Achieved    Target Date  07/22/18      PT SHORT TERM GOAL #2   Title  Pt will report 25% less hip pain at night    Baseline  Abolished    Time  6    Period  Weeks    Status  Achieved        PT Long Term Goals - 11/08/18 1604      PT LONG TERM GOAL #1   Title  pt will be independent with HEP and consistently doing it on her own at least 5x/week    Baseline  2 exercises added today, one more visit to finalize for discharge    Time  6    Period  Weeks    Status  Achieved      PT LONG TERM GOAL #2   Title  Pt will be able to demonstrate hip extension ROM to neutral for improved gait and upright posture    Baseline  equal Rt and Lt both 10 deg    Time  6    Period  Weeks    Status  Achieved      PT LONG TERM GOAL #3   Title  pt will report increased walking tolerance to 1 mile    Baseline  pt is walking 1.5 miles    Time  6    Period  Weeks    Status  Achieved      PT LONG TERM GOAL #4   Title  pt will decrease FOTO to 47% or  less    Baseline  31%    Time  6    Period  Weeks    Status  Achieved      PT LONG TERM GOAL #5   Title  Pt will perform 5 x sit to stand in less than 13 seconds    Baseline  12 sec    Time  6    Period  Weeks    Status  Achieved      PT LONG TERM GOAL #6   Title  Pt will be able to perform single leg stand x 10 sec on each side    Baseline  can stand over 10 seconds on both sides    Time  6    Period  Weeks    Status  Achieved            Plan - 11/08/18 1557    Clinical Impression Statement  Pt arrived to her final aquatic session very upset after loosing her pet yesterday. She reported she wanted to complete her aquatics and would try the best she could. Pt had moments of intense bouts of crying but was able to complete her aqautic session. Pt is independent in her understaning of how to use the principles of water to benefit her wether it be support, decreasing pain, or using it for resistance. She has plans to continue with the [employer] program at the [facility].    Personal Factors and Comorbidities  Comorbidity 2    Comorbidities  back surgery, THA, sleep dysfunction    Examination-Activity Limitations  Bed Mobility;Locomotion Level;Sleep    Stability/Clinical Decision Making  Evolving/Moderate complexity    Rehab Potential  Excellent    PT Frequency  2x / week    PT Duration  6 weeks    PT Treatment/Interventions  ADLs/Self Care Home Management;Biofeedback;Cryotherapy;Electrical Stimulation;Iontophoresis 55m/ml Dexamethasone;Moist Heat;Gait training;Stair training;Functional mobility training;Therapeutic activities;Therapeutic exercise;Neuromuscular re-education;Manual techniques;Dry needling;Passive range  of motion;Taping;Scar mobilization;Aquatic Therapy    PT Next Visit Plan  Discharge to combination of land and water HEP    PT Home Exercise Plan  Access Code: [email or web address]    Consulted and Agree with Plan of Care  Patient       Patient will benefit from skilled  therapeutic intervention in order to improve the following deficits and impairments:  Abnormal gait, Difficulty walking, Impaired flexibility, Decreased strength, Decreased range of motion, Pain, Postural dysfunction  Visit Diagnosis: Pain in left leg  Abnormal posture  Difficulty in walking, not elsewhere classified  Muscle weakness (generalized)     Problem List Patient Active Problem List   Diagnosis Date Noted  . Hip contracture, unspecified laterality 07/02/2018  . Overweight (BMI 25.0-29.9) 05/09/2017  . S/P right THA, AA 05/08/2017  . Chronic bilateral low back pain without sciatica 04/05/2015  . Disorder of sacroiliac joint 12/22/2014  . Chronic low back pain 12/22/2014  . Acquired scoliosis 11/27/2011  . Lumbosacral spondylosis without myelopathy 05/22/2011    [name], PTA 11/08/2018, 4:07 PM  [facility] [address], [place], [place], [address] Phone: [phone]   Fax:  [phone]  Name: [name] MRN: [id number] Date of Birth: December 30, 1945  PHYSICAL THERAPY DISCHARGE SUMMARY  Visits from Start of Care: 37 Current functional level related to goals / functional outcomes: See above all goals met   Remaining deficits: See above   Education / Equipment: HEP  Plan: Patient agrees to discharge.  Patient goals were met. Patient is being discharged due to meeting the stated rehab goals.  ?????    [employer], PT 11/15/18 8:07 AM

## 2018-11-12 ENCOUNTER — Other Ambulatory Visit: Payer: Self-pay

## 2018-11-12 ENCOUNTER — Encounter: Payer: Self-pay | Admitting: Physical Medicine & Rehabilitation

## 2018-11-12 ENCOUNTER — Encounter: Payer: Medicare Other | Attending: Physical Medicine & Rehabilitation | Admitting: Physical Medicine & Rehabilitation

## 2018-11-12 VITALS — BP 117/78 | HR 78 | Resp 14 | Ht 65.0 in | Wt 190.0 lb

## 2018-11-12 DIAGNOSIS — M24559 Contracture, unspecified hip: Secondary | ICD-10-CM | POA: Insufficient documentation

## 2018-11-12 DIAGNOSIS — M419 Scoliosis, unspecified: Secondary | ICD-10-CM

## 2018-11-12 DIAGNOSIS — M961 Postlaminectomy syndrome, not elsewhere classified: Secondary | ICD-10-CM | POA: Insufficient documentation

## 2018-11-12 DIAGNOSIS — Z96641 Presence of right artificial hip joint: Secondary | ICD-10-CM

## 2018-11-12 NOTE — Progress Notes (Signed)
Subjective:    Patient ID: [name], female    DOB: Jun 19, 1945, 73 y.o.   MRN: [id number]  HPI  73 year old female with history of lumbar scoliosis as well as right hip osteoarthritis.  She is status post right total hip arthroplasty per Dr. Alvan Dame as well as an extensive T2-S1 fusion per Dr. Rennis Harding. She has had chronic issues with hip abductor weakness particularly on the left side, leg length discrepancy requiring built-up shoe on the right side.  Chronic right hip contracture.  Because of these problems she was referred to outpatient physical therapy. Very happy with PT at [facility] Had 1:1 PT Was doing both aquatic and land based exercise Planning trip to [place] to visit daughter Plans to do [facility] senior program Doing Glut medius exercises Planning to do stationary bicycling  Her current ambulation distance is approximately 1 mile.  She has goals of walking 1.5 miles  Interval history very sad about the death of one of her dogs. Pain Inventory Average Pain 1 Pain Right Now 3 My pain is burning and aching  In the last 24 hours, has pain interfered with the following? General activity 1 Relation with others 1 Enjoyment of life 4 What TIME of day is your pain at its worst? morning Sleep (in general) Fair  Pain is worse with: inactivity and standing Pain improves with: rest, therapy/exercise and medication Relief from Meds: 6  Mobility walk with assistance use a cane how many minutes can you walk? 30 ability to climb steps?  yes do you drive?  yes  Function retired  Neuro/Psych trouble walking depression anxiety suicidal thoughts- no active plans  Prior Studies Any changes since last visit?  no  Physicians involved in your care Any changes since last visit?  no   Family History  Problem Relation Age of Onset  . Dementia Mother   . Stroke Mother   . Depression Mother   . COPD Father   . Cancer Brother   . Breast cancer Neg Hx    Social  History   Socioeconomic History  . Marital status: Married    Spouse name: Not on file  . Number of children: Not on file  . Years of education: Not on file  . Highest education level: Not on file  Occupational History  . Not on file  Social Needs  . Financial resource strain: Not on file  . Food insecurity    Worry: Not on file    Inability: Not on file  . Transportation needs    Medical: Not on file    Non-medical: Not on file  Tobacco Use  . Smoking status: Former Research scientist (life sciences)  . Smokeless tobacco: Never Used  . Tobacco comment: smoked for ayear and half in college   Substance and Sexual Activity  . Alcohol use: Yes    Alcohol/week: 3.0 standard drinks    Types: 3 Glasses of wine per week  . Drug use: Not on file  . Sexual activity: Not on file  Lifestyle  . Physical activity    Days per week: Not on file    Minutes per session: Not on file  . Stress: Not on file  Relationships  . Social Herbalist on phone: Not on file    Gets together: Not on file    Attends religious service: Not on file    Active member of club or organization: Not on file    Attends meetings of clubs or organizations:  Not on file    Relationship status: Not on file  Other Topics Concern  . Not on file  Social History Narrative  . Not on file   Past Surgical History:  Procedure Laterality Date  . ANKLE SURGERY Right 2015   lengthened the achilles   . AUGMENTATION MAMMAPLASTY Bilateral 2000 or 2001 unsure    Left Implant has busted and is no longer visible  . BACK SURGERY  2015   fusion , Dr Patrice Paradise at [facility]; has 2 metal rods in place , fusion from t2 to s1   . BACK SURGERY  01/2017   Dr Rennis Harding ; repair of cracked titanium rod in back lumbar   . bunionectomy  Bilateral 1980s   multiple   . COLONOSCOPY     with polypectomy ; q 5 years   . FOOT SURGERY  07/2011  . SHOULDER SURGERY Left 2013   rotator cuff   . TOTAL HIP ARTHROPLASTY Right 05/08/2017   Procedure:  RIGHT TOTAL HIP ARTHROPLASTY ANTERIOR APPROACH;  Surgeon: Paralee Cancel, MD;  Location: [facility];  Service: Orthopedics;  Laterality: Right;  70 mins   Past Medical History:  Diagnosis Date  . Congenital musculoskeletal deformity of spine   . Hypertension   . Incontinence of bowel 09/2013  . Lumbago   . Lumbosacral spondylosis without myelopathy   . Sleep disorder    uses gabapentin at bedtime   . Spinal stenosis, lumbar region, without neurogenic claudication    BP 117/78   Pulse 78   Resp 14   Ht 5\' 5"  (1.651 m)   Wt 190 lb (86.2 kg)   SpO2 94%   BMI 31.62 kg/m   Opioid Risk Score:   Fall Risk Score:  `1  Depression screen PHQ 2/9  Depression screen [facility] 2/9 05/21/2018 02/23/2015 02/16/2015 02/08/2015 01/22/2015  Decreased Interest 1 0 0 1 -  Down, Depressed, Hopeless 1 0 0 1 1  PHQ - 2 Score 2 0 0 2 1  Altered sleeping 1 0 2 - 1  Tired, decreased energy 1 0 2 - 1  Change in appetite 0 0 0 - 0  Feeling bad or failure about yourself  1 0 0 - 0  Trouble concentrating 1 0 0 - 1  Moving slowly or fidgety/restless 0 0 0 - 0  Suicidal thoughts 0 0 0 - 0  PHQ-9 Score 6 0 4 - 4  Difficult doing work/chores Somewhat difficult - Very difficult - Somewhat difficult    Review of Systems  Constitutional: Positive for unexpected weight change.  HENT: Negative.   Eyes: Negative.   Respiratory: Negative.   Cardiovascular: Negative.   Gastrointestinal: Negative.   Endocrine: Negative.   Genitourinary: Negative.   Musculoskeletal: Positive for gait problem.  Allergic/Immunologic: Negative.   Hematological: Negative.   Psychiatric/Behavioral: Positive for dysphoric mood and suicidal ideas. The patient is nervous/anxious.   All other systems reviewed and are negative.      Objective:   Physical Exam Vitals signs and nursing note reviewed.  Constitutional:      Appearance: She is obese.  Eyes:     Extraocular Movements: Extraocular movements intact.     Conjunctiva/sclera:  Conjunctivae normal.     Pupils: Pupils are equal, round, and reactive to light.  Neurological:     Mental Status: She is alert.   Right hip flexor contracture. No evidence of Trendelenburg gait during ambulation.  She is forward flexed at the right hip and  bends toward the right. Ambulates with a cane but is able to ambulate without the cane if she slows down her pace. Mood and affect emotionally labile crying at times         Assessment & Plan:  #1.  Thoracolumbar scoliosis, she has an extensive T12-S1 fusion.  She has made great gains with her ambulatory distance since completing land-based as well as aquatic therapy.  Recommend to continue her home exercise program as well as her ambulation.  Stationary bicycling may be also another way that she may be able to increase heart rate and metabolic rate for her goal is to lose 20 pounds.  #2.  Right hip contracture history of hip replacement.  She has a functional leg length discrepancy as well Continue with built-up shoe, continue with stretching program.  Patient will call back for physical medicine rehab follow-up.  She is not sure how long she is staying in [place]

## 2019-01-24 ENCOUNTER — Other Ambulatory Visit: Payer: Medicare Other

## 2019-02-08 ENCOUNTER — Ambulatory Visit: Payer: Medicare Other

## 2019-02-13 ENCOUNTER — Ambulatory Visit: Payer: Medicare Other

## 2019-02-19 ENCOUNTER — Ambulatory Visit: Payer: Medicare Other

## 2019-06-05 ENCOUNTER — Other Ambulatory Visit: Payer: Self-pay

## 2019-06-05 ENCOUNTER — Ambulatory Visit: Payer: Medicare Other | Attending: Orthopaedic Surgery | Admitting: Physical Therapy

## 2019-06-05 ENCOUNTER — Encounter: Payer: Self-pay | Admitting: Physical Therapy

## 2019-06-05 DIAGNOSIS — M79605 Pain in left leg: Secondary | ICD-10-CM | POA: Insufficient documentation

## 2019-06-05 DIAGNOSIS — M6281 Muscle weakness (generalized): Secondary | ICD-10-CM | POA: Diagnosis present

## 2019-06-05 DIAGNOSIS — R262 Difficulty in walking, not elsewhere classified: Secondary | ICD-10-CM | POA: Insufficient documentation

## 2019-06-05 DIAGNOSIS — R293 Abnormal posture: Secondary | ICD-10-CM | POA: Diagnosis present

## 2019-06-05 NOTE — Therapy (Signed)
[facility] [address], [place], [place], [address] Phone: [phone]   Fax:  [phone]  Physical Therapy Evaluation  Patient Details  Name: [name] MRN: [id number] Date of Birth: 1945/07/24 Referring Provider (PT): Rennis Harding, MD   Encounter Date: 06/05/2019  PT End of Session - 06/05/19 1017    Visit Number  1    Date for PT Re-Evaluation  09/05/19    Authorization Type  Medicare/BCBS    Authorization Time Period  06/05/19 to 09/05/19    Authorization - Visit Number  1    Authorization - Number of Visits  10    PT Start Time  0930    PT Stop Time  [id number]    PT Time Calculation (min)  45 min    Activity Tolerance  No increased pain;Patient tolerated treatment well    Behavior During Therapy  [facility] for tasks assessed/performed       Past Medical History:  Diagnosis Date  . Congenital musculoskeletal deformity of spine   . Hypertension   . Incontinence of bowel 09/2013  . Lumbago   . Lumbosacral spondylosis without myelopathy   . Sleep disorder    uses gabapentin at bedtime   . Spinal stenosis, lumbar region, without neurogenic claudication     Past Surgical History:  Procedure Laterality Date  . ANKLE SURGERY Right 2015   lengthened the achilles   . AUGMENTATION MAMMAPLASTY Bilateral 2000 or 2001 unsure    Left Implant has busted and is no longer visible  . BACK SURGERY  2015   fusion , Dr Patrice Paradise at [facility]; has 2 metal rods in place , fusion from t2 to s1   . BACK SURGERY  01/2017   Dr Rennis Harding ; repair of cracked titanium rod in back lumbar   . bunionectomy  Bilateral 1980s   multiple   . COLONOSCOPY     with polypectomy ; q 5 years   . FOOT SURGERY  07/2011  . SHOULDER SURGERY Left 2013   rotator cuff   . TOTAL HIP ARTHROPLASTY Right 05/08/2017   Procedure: RIGHT TOTAL HIP ARTHROPLASTY ANTERIOR APPROACH;  Surgeon: Paralee Cancel, MD;  Location: [facility];  Service: Orthopedics;   Laterality: Right;  70 mins    There were no vitals filed for this visit.   Subjective Assessment - 06/05/19 0938    Subjective  Pt states that she had a back fusion in January of 2019. She has had PT in the past following back surgery, but she has not been doing as good with her exercises due to COVID and being out of the gym. She did not have any therapy after her hip replacement in 04/2017. She is trying to stand and walk without her cane but is having trouble with increased soreness.    Pertinent History  rods/fusion T9 to pelvis    Limitations  Walking    Patient Stated Goals  walking without a cane, improve posture    Currently in Pain?  No/denies         [facility] PT Assessment - 06/05/19 0001      Assessment   Medical Diagnosis  fusion of lumbar spine    Referring Provider (PT)  Rennis Harding, MD    Onset Date/Surgical Date  --   01/2017 revision   Prior Therapy  6 months ago finished PT      Precautions   Precautions  None      Restrictions  Weight Bearing Restrictions  No      Balance Screen   Has the patient fallen in the past 6 months  No    Has the patient had a decrease in activity level because of a fear of falling?   No    Is the patient reluctant to leave their home because of a fear of falling?   No      Home Film/video editor residence      Prior Function   Leisure  walking 3 miles a day prior to this      Cognition   Overall Cognitive Status  Within Functional Limits for tasks assessed      Observation/Other Assessments   Focus on Therapeutic Outcomes (FOTO)   --      Sensation   Additional Comments  Rt LE numb from shin down to the foot       Posture/Postural Control   Posture Comments  Rt trunk lean, forward flexed       ROM / Strength   AROM / PROM / Strength  Strength      Strength   Overall Strength Comments  hip abduction and extension 3/5 MMT       Palpation   Palpation comment  tenderness thoraco-lumbar  paraspinals: more on the Lt      Transfers   Five time sit to stand comments   10 sec, weight shifted Lt and Rt trunk lean       Ambulation/Gait   Pre-Gait Activities  ascend and descend steps with both handrails, reciprocal pattern     Gait Comments  decreased Rt hip flexion/dorsiflexion; increased trendelenburg deviation on Lt and Rt hip; improved with SPC in Rt hand                   Objective measurements completed on examination: See above findings.      [place] [facility] PT Treatment/Exercise - 06/05/19 0001      Exercises   Exercises  Knee/Hip      Knee/Hip Exercises: Seated   Clamshell with TheraBand  Yellow   x10 reps Rt only   Other Seated Knee/Hip Exercises  scap retraction x5 reps HEP demo    Sit to Sand  1 set;5 reps   Lt LE forward             PT Education - 06/05/19 1046    Education Details  eval findings/POC; HEP implemented    Person(s) Educated  Patient    Methods  Explanation    Comprehension  Verbalized understanding       PT Short Term Goals - 06/05/19 1017      PT SHORT TERM GOAL #1   Title  Pt will be independent and consistent with her initial HEP to increase LE strength and posture.    Time  6    Period  Weeks    Status  New      PT SHORT TERM GOAL #2   Title  Pt will be able to complete sit to stand without significant weight shift to the Lt which will improve her efficiency with daily activity.    Time  6    Period  Weeks    Status  New        PT Long Term Goals - 06/05/19 1020      PT LONG TERM GOAL #1   Title  Pt will be able to ambulate 274ft without the need for  AD and with adequate foot clearance, minimal trunk lean.    Time  12    Period  Weeks    Status  New      PT LONG TERM GOAL #2   Title  Pt will have 4+/5 MMT strength of the LEs for improved mechanics with daily activity.    Time  12    Period  Weeks    Status  New      PT LONG TERM GOAL #3   Title  Pt will have no more than 3 inch distance from C7  to the wall when standing with her back facing the wall, to reflect improvements in her posture and endurance.    Time  12    Period  Weeks    Status  New      PT LONG TERM GOAL #4   Title  Pt will be able to go 4 hours without her wearing her back brace during the day.    Time  12    Period  Weeks    Status  New             Plan - 06/05/19 1033    Clinical Impression Statement  Pt returns to OPPT with complaints of ongoing LE weakness and abnormal posture worsening over the past 6 months. She has history of Rt THA and T9-pelvis rod revision in 2019. She has 3/5 MMT strength of the Lt and Rt hip, limited trunk strength and increased kyphosis. Pt currently ambulates with an AD for improved safety and has recently attempted to walk more with out it at home. Pt has increased muscle tension on the left thoracic/lumbar paraspinals secondary to attempts to self-correct her posture throughout the day. Pt has had good results with PT in the past and would greatly benefit from skilled PT to improve her LE/trunk strength, posture, and improve her mobility with daily activity.    Personal Factors and Comorbidities  Age;Fitness;Comorbidity 2;Past/Current Experience    Comorbidities  Rt THA, T9-pelvis fusion    Examination-Activity Limitations  Locomotion Level;Transfers    Stability/Clinical Decision Making  Evolving/Moderate complexity    Clinical Decision Making  Moderate    Rehab Potential  Good    PT Frequency  2x / week    PT Duration  12 weeks    PT Treatment/Interventions  ADLs/Self Care Home Management;Aquatic Therapy;Cryotherapy;Moist Heat;Electrical Stimulation;Therapeutic activities;Therapeutic exercise;Balance training;Gait training;Neuromuscular re-education;Manual techniques;Dry needling;Taping;Patient/family education    PT Next Visit Plan  posture strengthening progression; LE strength-pt has altered mechanics with gait/sit to stand following THA    PT Home Exercise Plan  [email or web address]     Consulted and Agree with Plan of Care  Patient       Patient will benefit from skilled therapeutic intervention in order to improve the following deficits and impairments:  Abnormal gait, Improper body mechanics, Pain, Increased muscle spasms, Decreased strength, Decreased range of motion, Decreased activity tolerance, Hypomobility, Impaired flexibility, Difficulty walking, Decreased balance  Visit Diagnosis: Difficulty in walking, not elsewhere classified  Muscle weakness (generalized)  Abnormal posture     Problem List Patient Active Problem List   Diagnosis Date Noted  . Hip contracture, unspecified laterality 07/02/2018  . Overweight (BMI 25.0-29.9) 05/09/2017  . S/P right THA, AA 05/08/2017  . Chronic bilateral low back pain without sciatica 04/05/2015  . Disorder of sacroiliac joint 12/22/2014  . Chronic low back pain 12/22/2014  . Acquired scoliosis 11/27/2011  . Lumbosacral spondylosis without myelopathy 05/22/2011   10:46  AM,06/05/19 [place] [place] [facility] [place]  [facility] [address], [place], [place], [address] Phone: [phone]   Fax:  [phone]  Name: [name] MRN: [id number] Date of Birth: [birth year]

## 2019-06-05 NOTE — Patient Instructions (Signed)
Access Code: [email or web address]: [email or web address]: 05/27/2021Prepared by: [place] to Stand - 1 x daily - 7 x weekly - 10 reps - 3 sets  Seated Scapular Retraction - 1 x daily - 7 x weekly - 10 reps - 3 sets  Seated Hip Abduction - 1 x daily - 7 x weekly - 10 reps - 3 sets  [facility] [address] [place], [place] [address] Phone # [phone] Fax [phone]

## 2019-06-06 ENCOUNTER — Other Ambulatory Visit: Payer: Self-pay

## 2019-06-06 ENCOUNTER — Ambulatory Visit: Payer: Medicare Other | Admitting: Physical Therapy

## 2019-06-06 ENCOUNTER — Encounter: Payer: Self-pay | Admitting: Physical Therapy

## 2019-06-06 DIAGNOSIS — M79605 Pain in left leg: Secondary | ICD-10-CM

## 2019-06-06 DIAGNOSIS — R293 Abnormal posture: Secondary | ICD-10-CM

## 2019-06-06 DIAGNOSIS — M6281 Muscle weakness (generalized): Secondary | ICD-10-CM

## 2019-06-06 DIAGNOSIS — R262 Difficulty in walking, not elsewhere classified: Secondary | ICD-10-CM | POA: Diagnosis not present

## 2019-06-06 NOTE — Therapy (Signed)
[facility] [address], [place], [place], [address] Phone: [phone]   Fax:  [phone]  Physical Therapy Treatment  Patient Details  Name: [name] MRN: [id number] Date of Birth: 1945-07-10 Referring Provider (PT): Rennis Harding, MD   Encounter Date: 06/06/2019  PT End of Session - 06/06/19 1617    Visit Number  2    Date for PT Re-Evaluation  09/05/19    Authorization Type  Medicare/BCBS    Authorization Time Period  06/05/19 to 09/05/19    Authorization - Visit Number  2    Authorization - Number of Visits  10    PT Start Time  1300    PT Stop Time  1345    PT Time Calculation (min)  45 min    Activity Tolerance  Patient tolerated treatment well    Behavior During Therapy  [facility] for tasks assessed/performed       Past Medical History:  Diagnosis Date  . Congenital musculoskeletal deformity of spine   . Hypertension   . Incontinence of bowel 09/2013  . Lumbago   . Lumbosacral spondylosis without myelopathy   . Sleep disorder    uses gabapentin at bedtime   . Spinal stenosis, lumbar region, without neurogenic claudication     Past Surgical History:  Procedure Laterality Date  . ANKLE SURGERY Right 2015   lengthened the achilles   . AUGMENTATION MAMMAPLASTY Bilateral 2000 or 2001 unsure    Left Implant has busted and is no longer visible  . BACK SURGERY  2015   fusion , Dr Patrice Paradise at [facility]; has 2 metal rods in place , fusion from t2 to s1   . BACK SURGERY  01/2017   Dr Rennis Harding ; repair of cracked titanium rod in back lumbar   . bunionectomy  Bilateral 1980s   multiple   . COLONOSCOPY     with polypectomy ; q 5 years   . FOOT SURGERY  07/2011  . SHOULDER SURGERY Left 2013   rotator cuff   . TOTAL HIP ARTHROPLASTY Right 05/08/2017   Procedure: RIGHT TOTAL HIP ARTHROPLASTY ANTERIOR APPROACH;  Surgeon: Paralee Cancel, MD;  Location: [facility];  Service: Orthopedics;  Laterality: Right;   70 mins    There were no vitals filed for this visit.  Subjective Assessment - 06/06/19 1619    Subjective  My back is sore from my evaluation yesterday.    Pertinent History  rods/fusion T9 to pelvis    Currently in Pain?  No/denies   Denies "pain" but has sore back muscles per pt report     Treatment: Aquatics: water temp 87.6 degrees. Pt enters and exits pool via 4 steps using the hand rails for assistance.  Standing in 75% submersion: Water walking with blue noodle for postural assit, 2 lengths in each direction. VC for posture and less cervical extension.  High knee marching with blue noodle 1/2 length of pool 2x RT hip flexion 20x, low speed through water Water bicycle 5 min with noodle behind pt.  Decompression float intermittently to reduce any back soreness.  Tree Pose 10 sec Bil using the blue noodle for assisted balance                            PT Short Term Goals - 06/05/19 1017      PT SHORT TERM GOAL #1   Title  Pt will be independent  and consistent with her initial HEP to increase LE strength and posture.    Time  6    Period  Weeks    Status  New      PT SHORT TERM GOAL #2   Title  Pt will be able to complete sit to stand without significant weight shift to the Lt which will improve her efficiency with daily activity.    Time  6    Period  Weeks    Status  New        PT Long Term Goals - 06/05/19 1020      PT LONG TERM GOAL #1   Title  Pt will be able to ambulate 237ft without the need for AD and with adequate foot clearance, minimal trunk lean.    Time  12    Period  Weeks    Status  New      PT LONG TERM GOAL #2   Title  Pt will have 4+/5 MMT strength of the LEs for improved mechanics with daily activity.    Time  12    Period  Weeks    Status  New      PT LONG TERM GOAL #3   Title  Pt will have no more than 3 inch distance from C7 to the wall when standing with her back facing the wall, to reflect improvements in her  posture and endurance.    Time  12    Period  Weeks    Status  New      PT LONG TERM GOAL #4   Title  Pt will be able to go 4 hours without her wearing her back brace during the day.    Time  12    Period  Weeks    Status  New            Plan - 06/06/19 1620    Clinical Impression Statement  Pt arrives for first aquatics session for this new plan of care. She denies "pain" referring to only back muscles being sore. Pt tolerated 45 min in the water with only 2 decompression float breaks due to her back starting to increase in soreness. The short rest breaks abolish any increased soreness. The upthrust from the water assists in pt's posture. Pt exercised in 75% submersion for duration.    Personal Factors and Comorbidities  Age;Fitness;Comorbidity 2;Past/Current Experience    Comorbidities  Rt THA, T9-pelvis fusion    Examination-Activity Limitations  Locomotion Level;Transfers    Stability/Clinical Decision Making  Evolving/Moderate complexity    Rehab Potential  Good    PT Frequency  2x / week    PT Duration  12 weeks    PT Treatment/Interventions  ADLs/Self Care Home Management;Aquatic Therapy;Cryotherapy;Moist Heat;Electrical Stimulation;Therapeutic activities;Therapeutic exercise;Balance training;Gait training;Neuromuscular re-education;Manual techniques;Dry needling;Taping;Patient/family education    PT Home Exercise Plan  [email or web address]    Consulted and Agree with Plan of Care  Patient       Patient will benefit from skilled therapeutic intervention in order to improve the following deficits and impairments:  Abnormal gait, Improper body mechanics, Pain, Increased muscle spasms, Decreased strength, Decreased range of motion, Decreased activity tolerance, Hypomobility, Impaired flexibility, Difficulty walking, Decreased balance  Visit Diagnosis: Difficulty in walking, not elsewhere classified  Muscle weakness (generalized)  Abnormal posture  Pain in left leg     Problem  List Patient Active Problem List   Diagnosis Date Noted  . Hip contracture, unspecified laterality 07/02/2018  . Overweight (BMI  25.0-29.9) 05/09/2017  . S/P right THA, AA 05/08/2017  . Chronic bilateral low back pain without sciatica 04/05/2015  . Disorder of sacroiliac joint 12/22/2014  . Chronic low back pain 12/22/2014  . Acquired scoliosis 11/27/2011  . Lumbosacral spondylosis without myelopathy 05/22/2011    [name], [name] 06/06/2019, 4:25 PM  [facility] [address], [place], [place], [address] Phone: [phone]   Fax:  [phone]  Name: [name] MRN: [id number] Date of Birth: [birth year]

## 2019-06-11 ENCOUNTER — Encounter: Payer: Self-pay | Admitting: Physical Therapy

## 2019-06-11 ENCOUNTER — Other Ambulatory Visit: Payer: Self-pay

## 2019-06-11 ENCOUNTER — Ambulatory Visit: Payer: Medicare Other | Attending: Orthopaedic Surgery | Admitting: Physical Therapy

## 2019-06-11 DIAGNOSIS — R293 Abnormal posture: Secondary | ICD-10-CM | POA: Diagnosis present

## 2019-06-11 DIAGNOSIS — M79605 Pain in left leg: Secondary | ICD-10-CM | POA: Insufficient documentation

## 2019-06-11 DIAGNOSIS — M6281 Muscle weakness (generalized): Secondary | ICD-10-CM | POA: Diagnosis present

## 2019-06-11 DIAGNOSIS — R262 Difficulty in walking, not elsewhere classified: Secondary | ICD-10-CM | POA: Insufficient documentation

## 2019-06-11 NOTE — Therapy (Signed)
[facility] [address], [place], [place], [address] Phone: [phone]   Fax:  [phone]  Physical Therapy Treatment  Patient Details  Name: [name] MRN: [id number] Date of Birth: 1945-08-17 Referring Provider (PT): Rennis Harding, MD   Encounter Date: 06/11/2019  PT End of Session - 06/11/19 1403    Visit Number  3    Date for PT Re-Evaluation  09/05/19    Authorization Type  Medicare/BCBS    Authorization Time Period  06/05/19 to 09/05/19    Authorization - Visit Number  3    Authorization - Number of Visits  10    PT Start Time  [id number]    PT Stop Time  [id number]    PT Time Calculation (min)  42 min    Activity Tolerance  Patient tolerated treatment well    Behavior During Therapy  [facility] for tasks assessed/performed       Past Medical History:  Diagnosis Date   Congenital musculoskeletal deformity of spine    Hypertension    Incontinence of bowel 09/2013   Lumbago    Lumbosacral spondylosis without myelopathy    Sleep disorder    uses gabapentin at bedtime    Spinal stenosis, lumbar region, without neurogenic claudication     Past Surgical History:  Procedure Laterality Date   ANKLE SURGERY Right 2015   lengthened the achilles    AUGMENTATION MAMMAPLASTY Bilateral 2000 or 2001 unsure    Left Implant has busted and is no longer visible   BACK SURGERY  2015   fusion , Dr Patrice Paradise at [facility]; has 2 metal rods in place , fusion from t2 to s1    BACK SURGERY  01/2017   Dr Rennis Harding ; repair of cracked titanium rod in back lumbar    bunionectomy  Bilateral 1980s   multiple    COLONOSCOPY     with polypectomy ; q 5 years    FOOT SURGERY  07/2011   SHOULDER SURGERY Left 2013   rotator cuff    TOTAL HIP ARTHROPLASTY Right 05/08/2017   Procedure: RIGHT TOTAL HIP ARTHROPLASTY ANTERIOR APPROACH;  Surgeon: Paralee Cancel, MD;  Location: [facility];  Service: Orthopedics;  Laterality: Right;  70  mins    There were no vitals filed for this visit.  Subjective Assessment - 06/11/19 1405    Subjective  I was sore from the pool but ok the next day.    Pertinent History  rods/fusion T9 to pelvis    Currently in Pain?  Yes    Pain Score  8     Pain Location  Back    Pain Orientation  Left;Mid    Aggravating Factors   Maybe my exercises    Pain Relieving Factors  If i rest appropriately and exercise apprpriately                        [facility] Adult PT Treatment/Exercise - 06/11/19 0001      Knee/Hip Exercises: Aerobic   Nustep  L2 x 10 min PTA discussed current stattus and post pool status      Knee/Hip Exercises: Seated   Other Seated Knee/Hip Exercises  Good return demo    Sit to Sand  2 sets;10 reps;without UE support   Using mostly RTLE     Knee/Hip Exercises: Supine   Other Supine Knee/Hip Exercises  Marching 2# on ankles 2x10 VC for core  Shoulder Exercises: Stretch   Other Shoulder Stretches  Thoracic ext stretch with small towel roll : 1 min x 2     RT arm overhead and lift the heart 4x 10 sec    Other Shoulder Stretches  Seated thoracic extension 2x10: adding to HEP             PT Education - 06/11/19 1503    Education Details  HEp update:    Person(s) Educated  Patient    Methods  Explanation;Demonstration;Tactile cues;Verbal cues;Handout    Comprehension  Verbalized understanding;Returned demonstration       PT Short Term Goals - 06/05/19 1017      PT SHORT TERM GOAL #1   Title  Pt will be independent and consistent with her initial HEP to increase LE strength and posture.    Time  6    Period  Weeks    Status  New      PT SHORT TERM GOAL #2   Title  Pt will be able to complete sit to stand without significant weight shift to the Lt which will improve her efficiency with daily activity.    Time  6    Period  Weeks    Status  New        PT Long Term Goals - 06/05/19 1020      PT LONG TERM GOAL #1   Title  Pt will be  able to ambulate 226ft without the need for AD and with adequate foot clearance, minimal trunk lean.    Time  12    Period  Weeks    Status  New      PT LONG TERM GOAL #2   Title  Pt will have 4+/5 MMT strength of the LEs for improved mechanics with daily activity.    Time  12    Period  Weeks    Status  New      PT LONG TERM GOAL #3   Title  Pt will have no more than 3 inch distance from C7 to the wall when standing with her back facing the wall, to reflect improvements in her posture and endurance.    Time  12    Period  Weeks    Status  New      PT LONG TERM GOAL #4   Title  Pt will be able to go 4 hours without her wearing her back brace during the day.    Time  12    Period  Weeks    Status  New            Plan - 06/11/19 1404    Clinical Impression Statement  Pt arrives with "sore back muscles." This did not impede her exercises. Pt requested working on stretchin gher thoracic spine. She was able to perform this is supine and seated. In seated pt needed cuing on how to use a small towel under RT hip to level her pelvis out. Pt was encouraged to use this set up for any time she was seated to do things at home. She agreed to try better and be more consistent. Pt had some residual soreness from the pool which abolished in 24 hrs. PTA lowered pt's cane slightly to give relief to her upper trap. HEP was added.    Personal Factors and Comorbidities  Age;Fitness;Comorbidity 2;Past/Current Experience    Comorbidities  Rt THA, T9-pelvis fusion    Examination-Activity Limitations  Locomotion Level;Transfers    Stability/Clinical Decision  Making  Evolving/Moderate complexity    PT Duration  12 weeks    PT Next Visit Plan  Aquatics next    PT Home Exercise Plan  [email or web address]    Consulted and Agree with Plan of Care  Patient       Patient will benefit from skilled therapeutic intervention in order to improve the following deficits and impairments:  Abnormal gait, Improper body  mechanics, Pain, Increased muscle spasms, Decreased strength, Decreased range of motion, Decreased activity tolerance, Hypomobility, Impaired flexibility, Difficulty walking, Decreased balance  Visit Diagnosis: Difficulty in walking, not elsewhere classified  Muscle weakness (generalized)  Abnormal posture  Pain in left leg     Problem List Patient Active Problem List   Diagnosis Date Noted   Hip contracture, unspecified laterality 07/02/2018   Overweight (BMI 25.0-29.9) 05/09/2017   S/P right THA, AA 05/08/2017   Chronic bilateral low back pain without sciatica 04/05/2015   Disorder of sacroiliac joint 12/22/2014   Chronic low back pain 12/22/2014   Acquired scoliosis 11/27/2011   Lumbosacral spondylosis without myelopathy 05/22/2011    Reagann Dolce, PTA 06/11/2019, 3:11 PM  [place] [address], [place], [place], [address] Phone: [phone]   Fax:  [phone]  Name: [name] MRN: [id number] Date of Birth: 07-20-1945  Access Code: [email or web address]: [email or web address]: 06/02/2021Prepared by: Anderson Malta CochranExercises  Sit to Stand - 1 x daily - 7 x weekly - 10 reps - 3 sets  Seated Scapular Retraction - 1 x daily - 7 x weekly - 10 reps - 3 sets  Seated Hip Abduction - 1 x daily - 7 x weekly - 10 reps - 3 sets  Thoracic Extension Mobilization with Noodle - 1 x daily - 7 x weekly - 3 sets - 10 reps  Seated Thoracic Lumbar Extension with Pectoralis Stretch - 2 x daily - 7 x weekly - 1 sets - 10 reps

## 2019-06-13 ENCOUNTER — Ambulatory Visit: Payer: Medicare Other | Admitting: Physical Therapy

## 2019-06-16 ENCOUNTER — Ambulatory Visit: Payer: Medicare Other | Admitting: Physical Therapy

## 2019-06-16 ENCOUNTER — Other Ambulatory Visit: Payer: Self-pay

## 2019-06-16 ENCOUNTER — Encounter: Payer: Self-pay | Admitting: Physical Therapy

## 2019-06-16 DIAGNOSIS — R262 Difficulty in walking, not elsewhere classified: Secondary | ICD-10-CM

## 2019-06-16 DIAGNOSIS — M6281 Muscle weakness (generalized): Secondary | ICD-10-CM

## 2019-06-16 DIAGNOSIS — R293 Abnormal posture: Secondary | ICD-10-CM

## 2019-06-16 DIAGNOSIS — M79605 Pain in left leg: Secondary | ICD-10-CM

## 2019-06-16 NOTE — Therapy (Signed)
[facility] [address], [place], [place], [address] Phone: [phone]   Fax:  [phone]  Physical Therapy Treatment  Patient Details  Name: [name] MRN: [id number] Date of Birth: 08/26/1945 Referring Provider (PT): Rennis Harding, MD   Encounter Date: 06/16/2019  PT End of Session - 06/16/19 1104    Visit Number  4    Date for PT Re-Evaluation  09/05/19    Authorization Type  Medicare/BCBS    Authorization Time Period  06/05/19 to 09/05/19    Authorization - Visit Number  4    Authorization - Number of Visits  10    PT Start Time  1104    PT Stop Time  1142    PT Time Calculation (min)  38 min    Activity Tolerance  Patient tolerated treatment well    Behavior During Therapy  [facility] for tasks assessed/performed       Past Medical History:  Diagnosis Date  . Congenital musculoskeletal deformity of spine   . Hypertension   . Incontinence of bowel 09/2013  . Lumbago   . Lumbosacral spondylosis without myelopathy   . Sleep disorder    uses gabapentin at bedtime   . Spinal stenosis, lumbar region, without neurogenic claudication     Past Surgical History:  Procedure Laterality Date  . ANKLE SURGERY Right 2015   lengthened the achilles   . AUGMENTATION MAMMAPLASTY Bilateral 2000 or 2001 unsure    Left Implant has busted and is no longer visible  . BACK SURGERY  2015   fusion , Dr Patrice Paradise at [facility]; has 2 metal rods in place , fusion from t2 to s1   . BACK SURGERY  01/2017   Dr Rennis Harding ; repair of cracked titanium rod in back lumbar   . bunionectomy  Bilateral 1980s   multiple   . COLONOSCOPY     with polypectomy ; q 5 years   . FOOT SURGERY  07/2011  . SHOULDER SURGERY Left 2013   rotator cuff   . TOTAL HIP ARTHROPLASTY Right 05/08/2017   Procedure: RIGHT TOTAL HIP ARTHROPLASTY ANTERIOR APPROACH;  Surgeon: Paralee Cancel, MD;  Location: [facility];  Service: Orthopedics;  Laterality: Right;  70  mins    There were no vitals filed for this visit.  Subjective Assessment - 06/16/19 1106    Subjective  Back still sore mid thoracic, been better doing my stretches at home.    Pertinent History  rods/fusion T9 to pelvis    Currently in Pain?  Yes    Pain Score  5     Pain Location  Back    Pain Orientation  Right;Left;Mid    Pain Descriptors / Indicators  Aching    Multiple Pain Sites  No                        [facility] Adult PT Treatment/Exercise - 06/16/19 0001      Knee/Hip Exercises: Aerobic   Nustep  L3 x 10 min PTA discussed current stattus and post pool status   Small towel roll  fo rsupport     Knee/Hip Exercises: Seated   Sit to Sand  --   2x5 with red band horizontal abd with LTLE slightly forward      Knee/Hip Exercises: Supine   Other Supine Knee/Hip Exercises  1 min decompression rest 2x during tx     Other Supine Knee/Hip Exercises  Red  hip clamshells 15x, VC to contract core       Knee/Hip Exercises: Sidelying   Hip ABduction  AROM;Strengthening;Right;2 sets;10 reps    Hip ABduction Limitations  Knee bent       Shoulder Exercises: Stretch   Other Shoulder Stretches  seated thoracic extension gentle 5 sec 5x   VC for trunk alignment, small towle under Rt hip   Other Shoulder Stretches  Seated raising Rt arm up, keeping hip down 3x10 sec then added small side bend stretch 10 sec  3x, added this piece to HEP               PT Short Term Goals - 06/05/19 1017      PT SHORT TERM GOAL #1   Title  Pt will be independent and consistent with her initial HEP to increase LE strength and posture.    Time  6    Period  Weeks    Status  New      PT SHORT TERM GOAL #2   Title  Pt will be able to complete sit to stand without significant weight shift to the Lt which will improve her efficiency with daily activity.    Time  6    Period  Weeks    Status  New        PT Long Term Goals - 06/05/19 1020      PT LONG TERM GOAL #1   Title  Pt  will be able to ambulate 246ft without the need for AD and with adequate foot clearance, minimal trunk lean.    Time  12    Period  Weeks    Status  New      PT LONG TERM GOAL #2   Title  Pt will have 4+/5 MMT strength of the LEs for improved mechanics with daily activity.    Time  12    Period  Weeks    Status  New      PT LONG TERM GOAL #3   Title  Pt will have no more than 3 inch distance from C7 to the wall when standing with her back facing the wall, to reflect improvements in her posture and endurance.    Time  12    Period  Weeks    Status  New      PT LONG TERM GOAL #4   Title  Pt will be able to go 4 hours without her wearing her back brace during the day.    Time  12    Period  Weeks    Status  New            Plan - 06/16/19 1133    Clinical Impression Statement  Pt missed her aquatic session Friday due to GI issues. She is better today. Back pain down significantly from her last session. Pt reports improved compliance with her home exercises, especially in the AM. Pt was able to perform sidelying hip abduction with a slightly bent knee very well today against gravity. Pt had a few LOB with sit to stand when the LTLE was slightly in front of her RT. Pt will add a slight side bend stretch to her HEP.    Personal Factors and Comorbidities  Age;Fitness;Comorbidity 2;Past/Current Experience    Comorbidities  Rt THA, T9-pelvis fusion    Examination-Activity Limitations  Locomotion Level;Transfers    Stability/Clinical Decision Making  Evolving/Moderate complexity    Rehab Potential  Good    PT Frequency  2x / week    PT Duration  12 weeks    PT Treatment/Interventions  ADLs/Self Care Home Management;Aquatic Therapy;Cryotherapy;Moist Heat;Electrical Stimulation;Therapeutic activities;Therapeutic exercise;Balance training;Gait training;Neuromuscular re-education;Manual techniques;Dry needling;Taping;Patient/family education    PT Next Visit Plan  Aquatics next    PT Home  Exercise Plan  [email or web address]    Consulted and Agree with Plan of Care  Patient       Patient will benefit from skilled therapeutic intervention in order to improve the following deficits and impairments:  Abnormal gait, Improper body mechanics, Pain, Increased muscle spasms, Decreased strength, Decreased range of motion, Decreased activity tolerance, Hypomobility, Impaired flexibility, Difficulty walking, Decreased balance  Visit Diagnosis: Difficulty in walking, not elsewhere classified  Muscle weakness (generalized)  Abnormal posture  Pain in left leg     Problem List Patient Active Problem List   Diagnosis Date Noted  . Hip contracture, unspecified laterality 07/02/2018  . Overweight (BMI 25.0-29.9) 05/09/2017  . S/P right THA, AA 05/08/2017  . Chronic bilateral low back pain without sciatica 04/05/2015  . Disorder of sacroiliac joint 12/22/2014  . Chronic low back pain 12/22/2014  . Acquired scoliosis 11/27/2011  . Lumbosacral spondylosis without myelopathy 05/22/2011    [name], PTA 06/16/2019, 11:37 AM  [facility] [address] [place], [place], [address] Phone: [phone]   Fax:  [phone]  Name: [name] MRN: [id number] Date of Birth: 01/19/1945

## 2019-06-20 ENCOUNTER — Other Ambulatory Visit: Payer: Self-pay

## 2019-06-20 ENCOUNTER — Ambulatory Visit: Payer: Medicare Other | Admitting: Physical Therapy

## 2019-06-20 ENCOUNTER — Encounter: Payer: Self-pay | Admitting: Physical Therapy

## 2019-06-20 DIAGNOSIS — R262 Difficulty in walking, not elsewhere classified: Secondary | ICD-10-CM

## 2019-06-20 DIAGNOSIS — M6281 Muscle weakness (generalized): Secondary | ICD-10-CM

## 2019-06-20 DIAGNOSIS — R293 Abnormal posture: Secondary | ICD-10-CM

## 2019-06-20 DIAGNOSIS — M79605 Pain in left leg: Secondary | ICD-10-CM

## 2019-06-20 NOTE — Therapy (Signed)
[facility] [address] [place], [place], [address] Phone: [phone]   Fax:  [phone]  Physical Therapy Treatment  Patient Details  Name: [name] MRN: [id number] Date of Birth: 1945-05-25 Referring Provider (PT): Rennis Harding, MD   Encounter Date: 06/20/2019   PT End of Session - 06/20/19 1616    Visit Number 5    Date for PT Re-Evaluation 09/05/19    Authorization Type Medicare/BCBS    Authorization Time Period 06/05/19 to 09/05/19    Authorization - Visit Number 5    Authorization - Number of Visits 10    PT Start Time 9702    PT Stop Time 1300    PT Time Calculation (min) 45 min    Activity Tolerance Patient tolerated treatment well    Behavior During Therapy [facility] for tasks assessed/performed           Past Medical History:  Diagnosis Date  . Congenital musculoskeletal deformity of spine   . Hypertension   . Incontinence of bowel 09/2013  . Lumbago   . Lumbosacral spondylosis without myelopathy   . Sleep disorder    uses gabapentin at bedtime   . Spinal stenosis, lumbar region, without neurogenic claudication     Past Surgical History:  Procedure Laterality Date  . ANKLE SURGERY Right 2015   lengthened the achilles   . AUGMENTATION MAMMAPLASTY Bilateral 2000 or 2001 unsure    Left Implant has busted and is no longer visible  . BACK SURGERY  2015   fusion , Dr Patrice Paradise at [facility]; has 2 metal rods in place , fusion from t2 to s1   . BACK SURGERY  01/2017   Dr Rennis Harding ; repair of cracked titanium rod in back lumbar   . bunionectomy  Bilateral 1980s   multiple   . COLONOSCOPY     with polypectomy ; q 5 years   . FOOT SURGERY  07/2011  . SHOULDER SURGERY Left 2013   rotator cuff   . TOTAL HIP ARTHROPLASTY Right 05/08/2017   Procedure: RIGHT TOTAL HIP ARTHROPLASTY ANTERIOR APPROACH;  Surgeon: Paralee Cancel, MD;  Location: [facility];  Service: Orthopedics;  Laterality: Right;  70  mins    There were no vitals filed for this visit.   Subjective Assessment - 06/20/19 1619    Subjective I was sore 2 days after last session.    Pertinent History rods/fusion T9 to pelvis    Limitations Walking    Patient Stated Goals walking without a cane, improve posture    Currently in Pain? Yes    Pain Score 5     Pain Location Back    Pain Orientation Right;Left;Mid    Pain Descriptors / Indicators Sore    Aggravating Factors  Overdoing anything, walking    Pain Relieving Factors If i balance rest and exercise    Multiple Pain Sites No           Treatment: Aquatics Water temp 87.6 degrees F Pt entered and exited pool voa 3 steps with hand rail  Pt performed all theraputic exercises in high waist depth water.  Water walking with small noodle press to attempt more neutral trunk. 2 lengths in each direction. Verbal cuing to slow pace and find better control with retro walking.  Decompression float with small orange noodle 1 min. Hip abduction 15x bil alternating with no UE assit. RT hip flexion 15x LTUE on side of pool. Decompression float 1 min  transitioning to 3 min of bicycling UE flex/ext with VC to contract core 20x Water weights breast stroke arms 1 min each direction 3 min sitting on water bench                             PT Short Term Goals - 06/20/19 1628      PT SHORT TERM GOAL #1   Title Pt will be independent and consistent with her initial HEP to increase LE strength and posture.    Time 6    Period Weeks    Status Achieved    Target Date 07/22/18             PT Long Term Goals - 06/05/19 1020      PT LONG TERM GOAL #1   Title Pt will be able to ambulate 277ft without the need for AD and with adequate foot clearance, minimal trunk lean.    Time 12    Period Weeks    Status New      PT LONG TERM GOAL #2   Title Pt will have 4+/5 MMT strength of the LEs for improved mechanics with daily activity.    Time 12     Period Weeks    Status New      PT LONG TERM GOAL #3   Title Pt will have no more than 3 inch distance from C7 to the wall when standing with her back facing the wall, to reflect improvements in her posture and endurance.    Time 12    Period Weeks    Status New      PT LONG TERM GOAL #4   Title Pt will be able to go 4 hours without her wearing her back brace during the day.    Time 12    Period Weeks    Status New                 Plan - 06/20/19 1621    Clinical Impression Statement Pt reports even the "gentle" stretching made her really sore for about 2 days. Pt reports difficulties pacing her activites at home and more than likely she is overdoing her ADLS. Pt needs frequent redirection during her exercises to perform them with significantly less effort/intensit and to allow the water to be more supportive. Pt can perform all exercises in high waist depth with little pain but she fatigues very quickly.    Personal Factors and Comorbidities Age;Fitness;Comorbidity 2;Past/Current Experience    Comorbidities Rt THA, T9-pelvis fusion    Examination-Activity Limitations Locomotion Level;Transfers    Rehab Potential Good    PT Frequency 2x / week    PT Duration 12 weeks    PT Treatment/Interventions ADLs/Self Care Home Management;Aquatic Therapy;Cryotherapy;Moist Heat;Electrical Stimulation;Therapeutic activities;Therapeutic exercise;Balance training;Gait training;Neuromuscular re-education;Manual techniques;Dry needling;Taping;Patient/family education    PT Next Visit Plan See how pt  did after pool. Perhaps dry needling for her back muscles?    PT Home Exercise Plan [email or web address]    Consulted and Agree with Plan of Care Patient           Patient will benefit from skilled therapeutic intervention in order to improve the following deficits and impairments:  Abnormal gait, Improper body mechanics, Pain, Increased muscle spasms, Decreased strength, Decreased range of motion, Decreased  activity tolerance, Hypomobility, Impaired flexibility, Difficulty walking, Decreased balance  Visit Diagnosis: Difficulty in walking, not elsewhere classified  Muscle weakness (generalized)  Abnormal posture  Pain in left leg     Problem List Patient Active Problem List   Diagnosis Date Noted  . Hip contracture, unspecified laterality 07/02/2018  . Overweight (BMI 25.0-29.9) 05/09/2017  . S/P right THA, AA 05/08/2017  . Chronic bilateral low back pain without sciatica 04/05/2015  . Disorder of sacroiliac joint 12/22/2014  . Chronic low back pain 12/22/2014  . Acquired scoliosis 11/27/2011  . Lumbosacral spondylosis without myelopathy 05/22/2011    Molleigh Huot, PTA 06/20/2019, 4:30 PM  [facility] [address], [place], [place], [address] Phone: [phone]   Fax:  [phone]  Name: [name] MRN: [id number] Date of Birth: 1945/02/12

## 2019-06-30 ENCOUNTER — Encounter: Payer: Self-pay | Admitting: Physical Therapy

## 2019-06-30 ENCOUNTER — Ambulatory Visit: Payer: Medicare Other | Admitting: Physical Therapy

## 2019-06-30 ENCOUNTER — Other Ambulatory Visit: Payer: Self-pay

## 2019-06-30 DIAGNOSIS — R262 Difficulty in walking, not elsewhere classified: Secondary | ICD-10-CM | POA: Diagnosis not present

## 2019-06-30 DIAGNOSIS — M79605 Pain in left leg: Secondary | ICD-10-CM

## 2019-06-30 DIAGNOSIS — R293 Abnormal posture: Secondary | ICD-10-CM

## 2019-06-30 DIAGNOSIS — M6281 Muscle weakness (generalized): Secondary | ICD-10-CM

## 2019-06-30 NOTE — Therapy (Signed)
[facility] [address] [place], [place], [address] Phone: [phone]   Fax:  [phone]  Physical Therapy Treatment  Patient Details  Name: Caera Enwright MRN: [id number] Date of Birth: 1945-05-30 Referring Provider (PT): Rennis Harding, MD   Encounter Date: 06/30/2019   PT End of Session - 06/30/19 1443    Visit Number 6    Date for PT Re-Evaluation 09/05/19    Authorization Type Medicare/BCBS    Authorization Time Period 06/05/19 to 09/05/19    Authorization - Visit Number 6    Authorization - Number of Visits 10    PT Start Time 7353   Has to leave early   PT Stop Time 1515    PT Time Calculation (min) 36 min    Activity Tolerance Patient tolerated treatment well    Behavior During Therapy [facility] for tasks assessed/performed           Past Medical History:  Diagnosis Date  . Congenital musculoskeletal deformity of spine   . Hypertension   . Incontinence of bowel 09/2013  . Lumbago   . Lumbosacral spondylosis without myelopathy   . Sleep disorder    uses gabapentin at bedtime   . Spinal stenosis, lumbar region, without neurogenic claudication     Past Surgical History:  Procedure Laterality Date  . ANKLE SURGERY Right 2015   lengthened the achilles   . AUGMENTATION MAMMAPLASTY Bilateral 2000 or 2001 unsure    Left Implant has busted and is no longer visible  . BACK SURGERY  2015   fusion , Dr Patrice Paradise at [facility]; has 2 metal rods in place , fusion from t2 to s1   . BACK SURGERY  01/2017   Dr Rennis Harding ; repair of cracked titanium rod in back lumbar   . bunionectomy  Bilateral 1980s   multiple   . COLONOSCOPY     with polypectomy ; q 5 years   . FOOT SURGERY  07/2011  . SHOULDER SURGERY Left 2013   rotator cuff   . TOTAL HIP ARTHROPLASTY Right 05/08/2017   Procedure: RIGHT TOTAL HIP ARTHROPLASTY ANTERIOR APPROACH;  Surgeon: Paralee Cancel, MD;  Location: [facility];  Service: Orthopedics;   Laterality: Right;  70 mins    There were no vitals filed for this visit.   Subjective Assessment - 06/30/19 1506    Subjective I put sheets on bed for 2 hours and then cut branches down, I hurt across my back. i think my show with the lift is too high, my knees are not even.    Pertinent History rods/fusion T9 to pelvis    Currently in Pain? Yes    Pain Score 7     Pain Location Back    Pain Orientation Left;Right;Mid    Pain Descriptors / Indicators Sore    Aggravating Factors  Overdoing anything walking    Pain Relieving Factors balancing rest & exercise, paying attention to my body mechanics    Multiple Pain Sites No                             [facility] Adult PT Treatment/Exercise - 06/30/19 0001      Knee/Hip Exercises: Aerobic   Nustep L3 x 6 min warm up, discussion of status      Moist Heat Therapy   Number Minutes Moist Heat 15 Minutes    Moist Heat Location --   mid back  Bil     Electrical Stimulation   Electrical Stimulation Location IFC mid back    Electrical Stimulation Parameters 80-150 HZ in S/L    Electrical Stimulation Goals Pain      Manual Therapy   Soft tissue mobilization Bil mid and low back pt in RT sidelying                    PT Short Term Goals - 06/20/19 1628      PT SHORT TERM GOAL #1   Title Pt will be independent and consistent with her initial HEP to increase LE strength and posture.    Time 6    Period Weeks    Status Achieved    Target Date 07/22/18             PT Long Term Goals - 06/05/19 1020      PT LONG TERM GOAL #1   Title Pt will be able to ambulate 221ft without the need for AD and with adequate foot clearance, minimal trunk lean.    Time 12    Period Weeks    Status New      PT LONG TERM GOAL #2   Title Pt will have 4+/5 MMT strength of the LEs for improved mechanics with daily activity.    Time 12    Period Weeks    Status New      PT LONG TERM GOAL #3   Title Pt will have no more  than 3 inch distance from C7 to the wall when standing with her back facing the wall, to reflect improvements in her posture and endurance.    Time 12    Period Weeks    Status New      PT LONG TERM GOAL #4   Title Pt will be able to go 4 hours without her wearing her back brace during the day.    Time 12    Period Weeks    Status New                 Plan - 06/30/19 1502    Clinical Impression Statement Pt arrives with 7/10 mid back pain. She reports she continues to have difficulty disciplining herself regardin gher ADLs; she spent 2 hours putting sheets on her bed ( stooped over) and then continues to reach forward cutting down branches/doing other yardwork. Pt completely understands why she shoulde not be in these postures for long periods of time but reports "they just have to get done." We took abreak from exercises to perform soft tissue work on her low and mid back muscles followed by Estim for pain relief. Pain post session 3/10.    Personal Factors and Comorbidities Age;Fitness;Comorbidity 2;Past/Current Experience    Comorbidities Rt THA, T9-pelvis fusion    Examination-Activity Limitations Locomotion Level;Transfers    Stability/Clinical Decision Making Evolving/Moderate complexity    Rehab Potential Good    PT Frequency 2x / week    PT Duration 12 weeks    PT Treatment/Interventions ADLs/Self Care Home Management;Aquatic Therapy;Cryotherapy;Moist Heat;Electrical Stimulation;Therapeutic activities;Therapeutic exercise;Balance training;Gait training;Neuromuscular re-education;Manual techniques;Dry needling;Taping;Patient/family education    PT Next Visit Plan evaluate if pt could benefit from dry needling to her back, gentle back strength and endurance    PT Home Exercise Plan [email or web address]    Consulted and Agree with Plan of Care Patient           Patient will benefit from skilled therapeutic intervention in order to improve the  following deficits and impairments:   Abnormal gait, Improper body mechanics, Pain, Increased muscle spasms, Decreased strength, Decreased range of motion, Decreased activity tolerance, Hypomobility, Impaired flexibility, Difficulty walking, Decreased balance  Visit Diagnosis: Difficulty in walking, not elsewhere classified  Muscle weakness (generalized)  Abnormal posture  Pain in left leg     Problem List Patient Active Problem List   Diagnosis Date Noted  . Hip contracture, unspecified laterality 07/02/2018  . Overweight (BMI 25.0-29.9) 05/09/2017  . S/P right THA, AA 05/08/2017  . Chronic bilateral low back pain without sciatica 04/05/2015  . Disorder of sacroiliac joint 12/22/2014  . Chronic low back pain 12/22/2014  . Acquired scoliosis 11/27/2011  . Lumbosacral spondylosis without myelopathy 05/22/2011    Neave Lenger, PTA 06/30/2019, 3:24 PM  [facility] [address], [place], [place], [address] Phone: [phone]   Fax:  [phone]  Name: Ermel Verne MRN: [id number] Date of Birth: [birth year]

## 2019-07-01 ENCOUNTER — Other Ambulatory Visit: Payer: Self-pay

## 2019-07-01 ENCOUNTER — Ambulatory Visit: Payer: Medicare Other | Admitting: Physical Therapy

## 2019-07-01 ENCOUNTER — Encounter: Payer: Self-pay | Admitting: Physical Therapy

## 2019-07-01 DIAGNOSIS — M79605 Pain in left leg: Secondary | ICD-10-CM

## 2019-07-01 DIAGNOSIS — M6281 Muscle weakness (generalized): Secondary | ICD-10-CM

## 2019-07-01 DIAGNOSIS — R262 Difficulty in walking, not elsewhere classified: Secondary | ICD-10-CM | POA: Diagnosis not present

## 2019-07-01 DIAGNOSIS — R293 Abnormal posture: Secondary | ICD-10-CM

## 2019-07-02 ENCOUNTER — Encounter: Payer: Self-pay | Admitting: Physical Therapy

## 2019-07-02 NOTE — Therapy (Signed)
[facility] [address], [place], [place], [address] Phone: [phone]   Fax:  [phone]  Physical Therapy Treatment  Patient Details  Name: [name] MRN: [id number] Date of Birth: 03-Aug-1945 Referring Provider (PT): Rennis Harding, MD   Encounter Date: 07/01/2019   PT End of Session - 07/02/19 0745    Visit Number 7    Date for PT Re-Evaluation 09/05/19    Authorization Type Medicare/BCBS    Authorization Time Period 06/05/19 to 09/05/19    Authorization - Visit Number 7    Authorization - Number of Visits 10    PT Start Time 9794    PT Stop Time 8016    PT Time Calculation (min) 43 min    Activity Tolerance Patient tolerated treatment well;No increased pain    Behavior During Therapy WFL for tasks assessed/performed           Past Medical History:  Diagnosis Date  . Congenital musculoskeletal deformity of spine   . Hypertension   . Incontinence of bowel 09/2013  . Lumbago   . Lumbosacral spondylosis without myelopathy   . Sleep disorder    uses gabapentin at bedtime   . Spinal stenosis, lumbar region, without neurogenic claudication     Past Surgical History:  Procedure Laterality Date  . ANKLE SURGERY Right 2015   lengthened the achilles   . AUGMENTATION MAMMAPLASTY Bilateral 2000 or 2001 unsure    Left Implant has busted and is no longer visible  . BACK SURGERY  2015   fusion , Dr Patrice Paradise at [facility]; has 2 metal rods in place , fusion from t2 to s1   . BACK SURGERY  01/2017   Dr Rennis Harding ; repair of cracked titanium rod in back lumbar   . bunionectomy  Bilateral 1980s   multiple   . COLONOSCOPY     with polypectomy ; q 5 years   . FOOT SURGERY  07/2011  . SHOULDER SURGERY Left 2013   rotator cuff   . TOTAL HIP ARTHROPLASTY Right 05/08/2017   Procedure: RIGHT TOTAL HIP ARTHROPLASTY ANTERIOR APPROACH;  Surgeon: Paralee Cancel, MD;  Location: [facility];  Service: Orthopedics;   Laterality: Right;  70 mins    There were no vitals filed for this visit.   Subjective Assessment - 07/01/19 1407    Subjective Pt is frustrated that she is still having pain and walking is still difficult with her fatigue and posture. She has had dry needling in the past with good improvements.    Pertinent History rods/fusion T9 to pelvis    How long can you walk comfortably? 10 minutes    Currently in Pain? Yes                             [facility] Adult PT Treatment/Exercise - 07/02/19 0001      Knee/Hip Exercises: Standing   Other Standing Knee Exercises hip hike/lower x5 reps each side off step      Knee/Hip Exercises: Seated   Clamshell with TheraBand Green   2x10 reps single leg    Other Seated Knee/Hip Exercises seated pallof press red TB pull Rt 2x10 reps     Sit to Sand 1 set;5 reps   TB around knees      Manual Therapy   Soft tissue mobilization STM lumbar and thoracic paraspinals  Trigger Point Dry Needling - 07/02/19 0001    Consent Given? Yes    Education Handout Provided Previously provided    Muscles Treated Back/Hip Erector spinae;Thoracic multifidi    Erector spinae Response Twitch response elicited;Palpable increased muscle length   Lt   Thoracic multifidi response Palpable increased muscle length;Twitch response elicited   mid/lower thoracic region                PT Education - 07/02/19 0745    Education Details HEP update    Person(s) Educated Patient    Methods Explanation    Comprehension Verbalized understanding            PT Short Term Goals - 06/20/19 1628      PT SHORT TERM GOAL #1   Title Pt will be independent and consistent with her initial HEP to increase LE strength and posture.    Time 6    Period Weeks    Status Achieved    Target Date 07/22/18             PT Long Term Goals - 06/05/19 1020      PT LONG TERM GOAL #1   Title Pt will be able to ambulate 218ft without the need for AD and  with adequate foot clearance, minimal trunk lean.    Time 12    Period Weeks    Status New      PT LONG TERM GOAL #2   Title Pt will have 4+/5 MMT strength of the LEs for improved mechanics with daily activity.    Time 12    Period Weeks    Status New      PT LONG TERM GOAL #3   Title Pt will have no more than 3 inch distance from C7 to the wall when standing with her back facing the wall, to reflect improvements in her posture and endurance.    Time 12    Period Weeks    Status New      PT LONG TERM GOAL #4   Title Pt will be able to go 4 hours without her wearing her back brace during the day.    Time 12    Period Weeks    Status New                 Plan - 07/02/19 0758    Clinical Impression Statement Pt is frustrated with ongoing pain and difficulty walking. PT reassured patient that she is making gradual progress expected for someone dealing with scoliosis and other comorbidities. PT made several updates to her HEP during today's session to address trunk and hip strength limitations. Pt has palpable muscle spasm along the thoracic and lumbar paraspinals and responded well to dry needling and soft tissue mobilization end of today's session. She would continue to benefit from skilled PT addressing trunk/hip weakness, and endurance with daily activity.    Personal Factors and Comorbidities Age;Fitness;Comorbidity 2;Past/Current Experience    Comorbidities Rt THA, T9-pelvis fusion    Examination-Activity Limitations Locomotion Level;Transfers    Stability/Clinical Decision Making Evolving/Moderate complexity    Rehab Potential Good    PT Frequency 2x / week    PT Duration 12 weeks    PT Treatment/Interventions ADLs/Self Care Home Management;Aquatic Therapy;Cryotherapy;Moist Heat;Electrical Stimulation;Therapeutic activities;Therapeutic exercise;Balance training;Gait training;Neuromuscular re-education;Manual techniques;Dry needling;Taping;Patient/family education    PT Next  Visit Plan seated trunk stability/strength, hip abductor strength progression, leg press might be a good option for single leg work; [employer] as  needed    PT Home Exercise Plan [email or web address]    Consulted and Agree with Plan of Care Patient           Patient will benefit from skilled therapeutic intervention in order to improve the following deficits and impairments:  Abnormal gait, Improper body mechanics, Pain, Increased muscle spasms, Decreased strength, Decreased range of motion, Decreased activity tolerance, Hypomobility, Impaired flexibility, Difficulty walking, Decreased balance  Visit Diagnosis: Difficulty in walking, not elsewhere classified  Muscle weakness (generalized)  Abnormal posture  Pain in left leg     Problem List Patient Active Problem List   Diagnosis Date Noted  . Hip contracture, unspecified laterality 07/02/2018  . Overweight (BMI 25.0-29.9) 05/09/2017  . S/P right THA, AA 05/08/2017  . Chronic bilateral low back pain without sciatica 04/05/2015  . Disorder of sacroiliac joint 12/22/2014  . Chronic low back pain 12/22/2014  . Acquired scoliosis 11/27/2011  . Lumbosacral spondylosis without myelopathy 05/22/2011    9:31 AM,07/02/19 Sherol Dade PT, DPT [facility] [address], [place], [place], [address] Phone: [phone]   Fax:  [phone]  Name: [name] MRN: [id number] Date of Birth: 18-Jun-1945

## 2019-07-09 ENCOUNTER — Ambulatory Visit: Payer: Medicare Other | Admitting: Physical Therapy

## 2019-07-09 ENCOUNTER — Encounter: Payer: Self-pay | Admitting: Physical Therapy

## 2019-07-09 ENCOUNTER — Other Ambulatory Visit: Payer: Self-pay

## 2019-07-09 DIAGNOSIS — R262 Difficulty in walking, not elsewhere classified: Secondary | ICD-10-CM

## 2019-07-09 DIAGNOSIS — M6281 Muscle weakness (generalized): Secondary | ICD-10-CM

## 2019-07-09 DIAGNOSIS — R293 Abnormal posture: Secondary | ICD-10-CM

## 2019-07-09 DIAGNOSIS — M79605 Pain in left leg: Secondary | ICD-10-CM

## 2019-07-09 NOTE — Patient Instructions (Signed)
Access Code: [email or web address] URL: [email or web address] Date: 07/09/2019 Prepared by: Almyra Free  Exercises Sit to Stand - 1 x daily - 7 x weekly - 10 reps - 3 sets Seated Hip Abduction - 1 x daily - 7 x weekly - 10 reps - 3 sets Standing Anti-Rotation Press with Anchored Resistance - 1 x daily - 7 x weekly - 3 sets - 10 reps Doorway Pec Stretch at 60 Elevation - 1 x daily - 7 x weekly - 3 sets - 10 reps Seated Thoracic Lumbar Extension - 1 x daily - 7 x weekly - 2 sets - 10 reps Single Leg Heel Raise with Chair Support - 1 x daily - 7 x weekly - 1-2 sets - 10 reps Supine Hip Abduction - 1 x daily - 7 x weekly - 1-3 sets - 10 reps Seated Thoracic Extension Arms Overhead - 1 x daily - 7 x weekly - 10 reps - 1-2 sets - 2-3 sec hold Seated Trunk Rotation - 1 x daily - 7 x weekly - 10 reps - 1-2 sets - 2-3 sec hold Seated Diagonal Chops with Medicine Ball - 1 x daily - 7 x weekly - 10 reps - 1-2 sets - 2-3 hold

## 2019-07-09 NOTE — Therapy (Signed)
[facility] [address] [place], [place], [address] Phone: [phone]   Fax:  [phone]  Physical Therapy Treatment  Patient Details  Name: [name] MRN: [id number] Date of Birth: Nov 02, 1945 Referring Provider (PT): Rennis Harding, MD   Encounter Date: 07/09/2019   PT End of Session - 07/09/19 1229    Visit Number 8    Date for PT Re-Evaluation 09/05/19    Authorization Type Medicare/BCBS    Authorization Time Period 06/05/19 to 09/05/19    Authorization - Visit Number 8    Authorization - Number of Visits 10    PT Start Time 5361    PT Stop Time 1318    PT Time Calculation (min) 48 min    Activity Tolerance Patient tolerated treatment well;No increased pain    Behavior During Therapy WFL for tasks assessed/performed           Past Medical History:  Diagnosis Date  . Congenital musculoskeletal deformity of spine   . Hypertension   . Incontinence of bowel 09/2013  . Lumbago   . Lumbosacral spondylosis without myelopathy   . Sleep disorder    uses gabapentin at bedtime   . Spinal stenosis, lumbar region, without neurogenic claudication     Past Surgical History:  Procedure Laterality Date  . ANKLE SURGERY Right 2015   lengthened the achilles   . AUGMENTATION MAMMAPLASTY Bilateral 2000 or 2001 unsure    Left Implant has busted and is no longer visible  . BACK SURGERY  2015   fusion , Dr Patrice Paradise at [facility]; has 2 metal rods in place , fusion from t2 to s1   . BACK SURGERY  01/2017   Dr Rennis Harding ; repair of cracked titanium rod in back lumbar   . bunionectomy  Bilateral 1980s   multiple   . COLONOSCOPY     with polypectomy ; q 5 years   . FOOT SURGERY  07/2011  . SHOULDER SURGERY Left 2013   rotator cuff   . TOTAL HIP ARTHROPLASTY Right 05/08/2017   Procedure: RIGHT TOTAL HIP ARTHROPLASTY ANTERIOR APPROACH;  Surgeon: Paralee Cancel, MD;  Location: [facility];  Service: Orthopedics;   Laterality: Right;  70 mins    There were no vitals filed for this visit.   Subjective Assessment - 07/09/19 1234    Subjective The needling helped for a few days.    Currently in Pain? Yes    Pain Score 5                              [facility] Adult PT Treatment/Exercise - 07/09/19 0001      Knee/Hip Exercises: Aerobic   Stationary Bike L2 x 5 min      Knee/Hip Exercises: Machines for Strengthening   Cybex Leg Press 50# 2x10 Rt leg seat 7; left leg seat 6; Bil 100# 2x 10      Knee/Hip Exercises: Standing   Heel Raises Both;2 sets;10 reps    Heel Raises Limitations single leg heel raise x 5 ea    Hip Abduction Stengthening;Left;10 reps;Knee straight    Abduction Limitations against wall; unable to stabilze on LLE for Rt side ABD    Other Standing Knee Exercises MFR with foam roller at wall while squatting; different levels of back      Knee/Hip Exercises: Seated   Other Seated Knee/Hip Exercises with small ball in hands: floor to  OH lift x 10 for thoracic ext; diagonals x 5 each; rotation x 5 ea.      Knee/Hip Exercises: Supine   Bridges Limitations on foam roller for MFR; also rolling side to side    Other Supine Knee/Hip Exercises HIp ABD x 10 bil      Knee/Hip Exercises: Sidelying   Hip ABduction Strengthening;Left;5 reps;Right;10 reps    Hip ABduction Limitations unable to get full range with LLE    Other Sidelying Knee/Hip Exercises open book lying on right side x 5 with 10 second hold and cues to breathe                  PT Education - 07/09/19 1615    Education Details HEP modified; MFR using foam roller on wall and supine on mat    Person(s) Educated Patient    Methods Explanation;Demonstration;Handout    Comprehension Verbalized understanding;Returned demonstration            PT Short Term Goals - 06/20/19 1628      PT SHORT TERM GOAL #1   Title Pt will be independent and consistent with her initial HEP to increase LE strength and  posture.    Time 6    Period Weeks    Status Achieved    Target Date 07/22/18             PT Long Term Goals - 06/05/19 1020      PT LONG TERM GOAL #1   Title Pt will be able to ambulate 250ft without the need for AD and with adequate foot clearance, minimal trunk lean.    Time 12    Period Weeks    Status New      PT LONG TERM GOAL #2   Title Pt will have 4+/5 MMT strength of the LEs for improved mechanics with daily activity.    Time 12    Period Weeks    Status New      PT LONG TERM GOAL #3   Title Pt will have no more than 3 inch distance from C7 to the wall when standing with her back facing the wall, to reflect improvements in her posture and endurance.    Time 12    Period Weeks    Status New      PT LONG TERM GOAL #4   Title Pt will be able to go 4 hours without her wearing her back brace during the day.    Time 12    Period Weeks    Status New                 Plan - 07/09/19 1616    Clinical Impression Statement Patient reports only temporary relief from DN and wanted to focus on strengthening today. She did well on leg press, but is challenged by SLS activities. We tried various postions for glut med strengthening and HEP was progressed. SDLY hip ABD too difficult for left side. Trunk flexibility and strength was also progressed. LTGs are ongoing.    Comorbidities Rt THA, T9-pelvis fusion    PT Frequency 2x / week    PT Duration 12 weeks    PT Treatment/Interventions ADLs/Self Care Home Management;Aquatic Therapy;Cryotherapy;Moist Heat;Electrical Stimulation;Therapeutic activities;Therapeutic exercise;Balance training;Gait training;Neuromuscular re-education;Manual techniques;Dry needling;Taping;Patient/family education    PT Next Visit Plan seated trunk stability/strength, hip abductor strength progression, STM prn    PT Home Exercise Plan [email or web address]    Consulted and Agree with Plan of Care Patient  Patient will benefit from skilled  therapeutic intervention in order to improve the following deficits and impairments:  Abnormal gait, Improper body mechanics, Pain, Increased muscle spasms, Decreased strength, Decreased range of motion, Decreased activity tolerance, Hypomobility, Impaired flexibility, Difficulty walking, Decreased balance  Visit Diagnosis: Muscle weakness (generalized)  Difficulty in walking, not elsewhere classified  Abnormal posture  Pain in left leg     Problem List Patient Active Problem List   Diagnosis Date Noted  . Hip contracture, unspecified laterality 07/02/2018  . Overweight (BMI 25.0-29.9) 05/09/2017  . S/P right THA, AA 05/08/2017  . Chronic bilateral low back pain without sciatica 04/05/2015  . Disorder of sacroiliac joint 12/22/2014  . Chronic low back pain 12/22/2014  . Acquired scoliosis 11/27/2011  . Lumbosacral spondylosis without myelopathy 05/22/2011    Madelyn Flavors PT 07/09/2019, 4:28 PM  [facility] [address], [place], [place], [address] Phone: [phone]   Fax:  [phone]  Name: Lichelle Viets MRN: [id number] Date of Birth: 1945-11-03

## 2019-07-15 ENCOUNTER — Ambulatory Visit: Payer: Medicare Other | Attending: Orthopaedic Surgery | Admitting: Physical Therapy

## 2019-07-15 ENCOUNTER — Encounter: Payer: Self-pay | Admitting: Physical Therapy

## 2019-07-15 ENCOUNTER — Other Ambulatory Visit: Payer: Self-pay

## 2019-07-15 DIAGNOSIS — M6281 Muscle weakness (generalized): Secondary | ICD-10-CM | POA: Insufficient documentation

## 2019-07-15 DIAGNOSIS — M79605 Pain in left leg: Secondary | ICD-10-CM | POA: Diagnosis present

## 2019-07-15 DIAGNOSIS — R293 Abnormal posture: Secondary | ICD-10-CM | POA: Diagnosis present

## 2019-07-15 DIAGNOSIS — R262 Difficulty in walking, not elsewhere classified: Secondary | ICD-10-CM | POA: Insufficient documentation

## 2019-07-15 NOTE — Therapy (Signed)
[facility] [address], [place], [place], [address] Phone: [phone]   Fax:  [phone]  Physical Therapy Treatment  Patient Details  Name: [name] MRN: [id number] Date of Birth: 1945/03/26 Referring Provider (PT): Rennis Harding, MD   Encounter Date: 07/15/2019   PT End of Session - 07/15/19 1118    Visit Number 9    Date for PT Re-Evaluation 09/05/19    Authorization Type Medicare/BCBS    Authorization Time Period 06/05/19 to 09/05/19    Authorization - Visit Number 9    Authorization - Number of Visits 10    PT Start Time 1100    PT Stop Time 8828    PT Time Calculation (min) 44 min    Activity Tolerance Patient tolerated treatment well;No increased pain    Behavior During Therapy WFL for tasks assessed/performed           Past Medical History:  Diagnosis Date  . Congenital musculoskeletal deformity of spine   . Hypertension   . Incontinence of bowel 09/2013  . Lumbago   . Lumbosacral spondylosis without myelopathy   . Sleep disorder    uses gabapentin at bedtime   . Spinal stenosis, lumbar region, without neurogenic claudication     Past Surgical History:  Procedure Laterality Date  . ANKLE SURGERY Right 2015   lengthened the achilles   . AUGMENTATION MAMMAPLASTY Bilateral 2000 or 2001 unsure    Left Implant has busted and is no longer visible  . BACK SURGERY  2015   fusion , Dr Patrice Paradise at [facility]; has 2 metal rods in place , fusion from t2 to s1   . BACK SURGERY  01/2017   Dr Rennis Harding ; repair of cracked titanium rod in back lumbar   . bunionectomy  Bilateral 1980s   multiple   . COLONOSCOPY     with polypectomy ; q 5 years   . FOOT SURGERY  07/2011  . SHOULDER SURGERY Left 2013   rotator cuff   . TOTAL HIP ARTHROPLASTY Right 05/08/2017   Procedure: RIGHT TOTAL HIP ARTHROPLASTY ANTERIOR APPROACH;  Surgeon: Paralee Cancel, MD;  Location: [facility];  Service: Orthopedics;   Laterality: Right;  70 mins    There were no vitals filed for this visit.   Subjective Assessment - 07/15/19 1101    Subjective Pt states that she has been working on her older exercises. No complaints at this time.    Currently in Pain? Yes    Pain Score 6     Pain Location Back    Pain Orientation Left;Right;Lateral    Pain Descriptors / Indicators Sore    Pain Type Chronic pain    Pain Radiating Towards none    Pain Onset More than a month ago    Pain Frequency Constant    Aggravating Factors  just there                             [facility] Adult PT Treatment/Exercise - 07/15/19 0001      Knee/Hip Exercises: Stretches   Other Knee/Hip Stretches Lt adductor butterfly stretch 4x10 sec hold       Knee/Hip Exercises: Aerobic   Stationary Bike L4 x6 min PT present to discuss POC      Knee/Hip Exercises: Machines for Strengthening   Cybex Leg Press seat 7: BLE #110 x15 reps, single leg #70 x15 reps on Lt, x15  reps on Rt       Knee/Hip Exercises: Seated   Other Seated Knee/Hip Exercises lat stretch over foam roll 5x10 sec hold       Knee/Hip Exercises: Supine   Bridges Both;1 set;10 reps    Bridges Limitations x5 reps each with small LE march     Other Supine Knee/Hip Exercises neck retraction x10 reps       Knee/Hip Exercises: Sidelying   Hip ABduction Strengthening;Right;Left;2 sets;10 reps    Hip ABduction Limitations Lt needing some assistance to end range      Manual Therapy   Manual Therapy Joint mobilization    Manual therapy comments contract/relax/stretch Lt hip adductor     Joint Mobilization Rt hip inferior/lateral joint mobilization 3 bouts x30 sec                   PT Education - 07/15/19 1459    Education Details technique with therex; updated HEP    Person(s) Educated Patient    Methods Explanation;Verbal cues;Handout    Comprehension Verbalized understanding;Returned demonstration            PT Short Term Goals - 06/20/19  1628      PT SHORT TERM GOAL #1   Title Pt will be independent and consistent with her initial HEP to increase LE strength and posture.    Time 6    Period Weeks    Status Achieved    Target Date 07/22/18             PT Long Term Goals - 07/15/19 1112      PT LONG TERM GOAL #1   Title Pt will be able to ambulate 238f without the need for AD and with adequate foot clearance, minimal trunk lean.    Time 12    Period Weeks    Status New      PT LONG TERM GOAL #2   Title Pt will have 4+/5 MMT strength of the LEs for improved mechanics with daily activity.    Time 12    Period Weeks    Status New      PT LONG TERM GOAL #3   Title Pt will have no more than 3 inch distance from C7 to the wall when standing with her back facing the wall, to reflect improvements in her posture and endurance.    Baseline 3.5 inches    Time 12    Period Weeks    Status Partially Met      PT LONG TERM GOAL #4   Title Pt will be able to go 4 hours without her wearing her back brace during the day.    Time 12    Period Weeks    Status New                 Plan - 07/15/19 1145    Clinical Impression Statement Pt is making slow progress towards her goals. Her posture is improved, and she can stand within 3.5 inches from the wall compared to greater than 5 inches at her evaluation. Pt has hip mobility and strength limitations on the Lt, so PT completed joint mobilization and made exercise modifications during today's session. Pt was able to progress resistance with leg press. PT updated her HEP to improve understanding of the goals of each exercise. She reported trunk and LE fatigue end of session.    Comorbidities Rt THA, T9-pelvis fusion    PT Frequency 2x / week  PT Duration 12 weeks    PT Treatment/Interventions ADLs/Self Care Home Management;Aquatic Therapy;Cryotherapy;Moist Heat;Electrical Stimulation;Therapeutic activities;Therapeutic exercise;Balance training;Gait  training;Neuromuscular re-education;Manual techniques;Dry needling;Taping;Patient/family education    PT Next Visit Plan 10th visit progress note; seated trunk stability/strength, hip abductor strength progression, increase resistance/reps/sets with leg press; STM prn    PT Home Exercise Plan [id number]    Consulted and Agree with Plan of Care Patient           Patient will benefit from skilled therapeutic intervention in order to improve the following deficits and impairments:  Abnormal gait, Improper body mechanics, Pain, Increased muscle spasms, Decreased strength, Decreased range of motion, Decreased activity tolerance, Hypomobility, Impaired flexibility, Difficulty walking, Decreased balance  Visit Diagnosis: Muscle weakness (generalized)  Difficulty in walking, not elsewhere classified  Abnormal posture  Pain in left leg     Problem List Patient Active Problem List   Diagnosis Date Noted  . Hip contracture, unspecified laterality 07/02/2018  . Overweight (BMI 25.0-29.9) 05/09/2017  . S/P right THA, AA 05/08/2017  . Chronic bilateral low back pain without sciatica 04/05/2015  . Disorder of sacroiliac joint 12/22/2014  . Chronic low back pain 12/22/2014  . Acquired scoliosis 11/27/2011  . Lumbosacral spondylosis without myelopathy 05/22/2011   3:02 PM,07/15/19 [name] PT, DPT [facility] [address] [place], [place], [address] Phone: [phone]   Fax:  [phone]  Name: [name] MRN: [id number] Date of Birth: March 31, 1945

## 2019-07-18 ENCOUNTER — Encounter: Payer: Medicare Other | Admitting: Physical Therapy

## 2019-07-21 ENCOUNTER — Encounter: Payer: Medicare Other | Admitting: Physical Therapy

## 2019-07-23 ENCOUNTER — Ambulatory Visit: Payer: Medicare Other | Admitting: Physical Therapy

## 2019-07-23 ENCOUNTER — Other Ambulatory Visit: Payer: Self-pay

## 2019-07-23 ENCOUNTER — Encounter: Payer: Self-pay | Admitting: Physical Therapy

## 2019-07-23 DIAGNOSIS — M79605 Pain in left leg: Secondary | ICD-10-CM

## 2019-07-23 DIAGNOSIS — M6281 Muscle weakness (generalized): Secondary | ICD-10-CM | POA: Diagnosis not present

## 2019-07-23 DIAGNOSIS — R293 Abnormal posture: Secondary | ICD-10-CM

## 2019-07-23 DIAGNOSIS — R262 Difficulty in walking, not elsewhere classified: Secondary | ICD-10-CM

## 2019-07-23 NOTE — Therapy (Addendum)
[facility] [address] [place], [place], [address] Phone: [phone]   Fax:  [phone]  Physical Therapy Treatment  Patient Details  Name: [name] MRN: [id number] Date of Birth: 05-08-1945 Referring Provider (PT): Rennis Harding, MD   Progress Note Reporting Period 06/05/19 to 07/23/19  See note below for Objective Data and Assessment of Progress/Goals.          Encounter Date: 07/23/2019   PT End of Session - 07/23/19 1103    Visit Number 10    Date for PT Re-Evaluation 09/05/19    Authorization Type Medicare/BCBS    Authorization Time Period 06/05/19 to 09/05/19    Authorization - Visit Number 10    Authorization - Number of Visits 10    PT Start Time 1100    PT Stop Time 1145    PT Time Calculation (min) 45 min    Activity Tolerance Patient tolerated treatment well    Behavior During Therapy [facility] for tasks assessed/performed           Past Medical History:  Diagnosis Date  . Congenital musculoskeletal deformity of spine   . Hypertension   . Incontinence of bowel 09/2013  . Lumbago   . Lumbosacral spondylosis without myelopathy   . Sleep disorder    uses gabapentin at bedtime   . Spinal stenosis, lumbar region, without neurogenic claudication     Past Surgical History:  Procedure Laterality Date  . ANKLE SURGERY Right 2015   lengthened the achilles   . AUGMENTATION MAMMAPLASTY Bilateral 2000 or 2001 unsure    Left Implant has busted and is no longer visible  . BACK SURGERY  2015   fusion , Dr Patrice Paradise at [facility]; has 2 metal rods in place , fusion from t2 to s1   . BACK SURGERY  01/2017   Dr Rennis Harding ; repair of cracked titanium rod in back lumbar   . bunionectomy  Bilateral 1980s   multiple   . COLONOSCOPY     with polypectomy ; q 5 years   . FOOT SURGERY  07/2011  . SHOULDER SURGERY Left 2013   rotator cuff   . TOTAL HIP ARTHROPLASTY Right 05/08/2017   Procedure:  RIGHT TOTAL HIP ARTHROPLASTY ANTERIOR APPROACH;  Surgeon: Paralee Cancel, MD;  Location: [facility];  Service: Orthopedics;  Laterality: Right;  70 mins    There were no vitals filed for this visit.   Subjective Assessment - 07/23/19 1105    Subjective A lot of stuff going on at home, back about the same.    Pertinent History rods/fusion T9 to pelvis    Limitations Walking    Currently in Pain? Yes    Pain Score 5     Pain Location Back    Pain Orientation Right;Left;Mid    Pain Descriptors / Indicators Sore    Aggravating Factors  Constant    Pain Relieving Factors rest    Multiple Pain Sites No              [facility] PT Assessment - 07/23/19 0001      Assessment   Medical Diagnosis fusion of lumbar spine    Referring Provider (PT) Rennis Harding, MD      Strength   Overall Strength Comments Lt hip 3/5 abduction, Rt 4/5  Hip extension Lt 3+/5, RT 4/5      Transfers   Five time sit to stand comments  11  [facility] Adult PT Treatment/Exercise - 07/23/19 0001      Knee/Hip Exercises: Stretches   Other Knee/Hip Stretches Supine leg lengtheer strtech to decompress 3x5 sec Bil    Other Knee/Hip Stretches Lt adductor butterfly stretch 2x30 sec hold       Knee/Hip Exercises: Aerobic   Stationary Bike L4 x 12 min PTA present to discuss status/progress      Knee/Hip Exercises: Machines for Strengthening   Cybex Leg Press Seat 7: BilLE 110# 20x, single leg 70#  15x2 Bil      Knee/Hip Exercises: Standing   Hip Abduction --   red band side stepping along counter 4x     Knee/Hip Exercises: Seated   Other Seated Knee/Hip Exercises lat stretch over foam roll 5x15 sec hold    Vc to sit back into hips                   PT Short Term Goals - 07/23/19 1141      PT SHORT TERM GOAL #1   Title Pt will be independent and consistent with her initial HEP to increase LE strength and posture.    Time 6    Period Weeks    Status Achieved    Target  Date 07/22/18      PT SHORT TERM GOAL #2   Title Pt will be able to complete sit to stand without significant weight shift to the Lt which will improve her efficiency with daily activity.    Time 6    Period Weeks    Status Achieved   Min weight shift            PT Long Term Goals - 07/23/19 1141      PT LONG TERM GOAL #1   Title Pt will be able to ambulate 272f without the need for AD and with adequate foot clearance, minimal trunk lean.    Time 12    Period Weeks    Status Partially Met   Not consistently: fatfigue factore as well as what she has already done that day     PT LONG TERM GOAL #2   Title Pt will have 4+/5 MMT strength of the LEs for improved mechanics with daily activity.    Period Weeks    Status Partially Met      PT LONG TERM GOAL #3   Title Pt will have no more than 3 inch distance from C7 to the wall when standing with her back facing the wall, to reflect improvements in her posture and endurance.    Period Weeks    Status Partially Met      PT LONG TERM GOAL #4   Title Pt will be able to go 4 hours without her wearing her back brace during the day.    Time 12    Period Weeks    Status Partially Met   Has not worn in last five days, but pt not confident she wil stick with it. May go back to wearing for certain activities.     PT LONG TERM GOAL #5   Title Pt will perform 5 x sit to stand in less than 13 seconds    Period Weeks    Status Achieved   11 sec                Plan - 07/23/19 1203    Clinical Impression Statement Pt's pain level is reportedly staying in the 5-6/10 range. She presents with improving  standing posture and at times can ambulate without significant trendelenberg. MMT on RT hip improved greater than LT, see measurements in chart. Pt semicompliant with HEP and has difficuluties balancing her activities at home. She will do any activity for hours before changing her position or taking a rest. Pt reports less pain when  exercising in the water.Pt gradually meeting goals. Sit to stand goal is met.    Personal Factors and Comorbidities Age;Fitness;Comorbidity 2;Past/Current Experience    Comorbidities Rt THA, T9-pelvis fusion    Examination-Activity Limitations Locomotion Level;Transfers    Stability/Clinical Decision Making Evolving/Moderate complexity    Rehab Potential Good    PT Frequency 2x / week    PT Duration 12 weeks    PT Treatment/Interventions ADLs/Self Care Home Management;Aquatic Therapy;Cryotherapy;Moist Heat;Electrical Stimulation;Therapeutic activities;Therapeutic exercise;Balance training;Gait training;Neuromuscular re-education;Manual techniques;Dry needling;Taping;Patient/family education    PT Next Visit Plan Aquatics next session    PT Home Exercise Plan [id number]    Consulted and Agree with Plan of Care Patient           Patient will benefit from skilled therapeutic intervention in order to improve the following deficits and impairments:  Abnormal gait, Improper body mechanics, Pain, Increased muscle spasms, Decreased strength, Decreased range of motion, Decreased activity tolerance, Hypomobility, Impaired flexibility, Difficulty walking, Decreased balance  Visit Diagnosis: Muscle weakness (generalized)  Difficulty in walking, not elsewhere classified  Abnormal posture  Pain in left leg     Problem List Patient Active Problem List   Diagnosis Date Noted  . Hip contracture, unspecified laterality 07/02/2018  . Overweight (BMI 25.0-29.9) 05/09/2017  . S/P right THA, AA 05/08/2017  . Chronic bilateral low back pain without sciatica 04/05/2015  . Disorder of sacroiliac joint 12/22/2014  . Chronic low back pain 12/22/2014  . Acquired scoliosis 11/27/2011  . Lumbosacral spondylosis without myelopathy 05/22/2011    Rainelle Sulewski, PTA 07/23/2019, 12:11 PM  [facility] [address] [place], [place],  [address] Phone: [phone]   Fax:  [phone]  Name: Lavanna Rog MRN: [id number] Date of Birth: [birth year]

## 2019-07-25 ENCOUNTER — Ambulatory Visit: Payer: Medicare Other | Admitting: Physical Therapy

## 2019-07-25 ENCOUNTER — Other Ambulatory Visit: Payer: Self-pay

## 2019-07-25 ENCOUNTER — Encounter: Payer: Self-pay | Admitting: Physical Therapy

## 2019-07-25 ENCOUNTER — Encounter: Payer: Medicare Other | Admitting: Physical Therapy

## 2019-07-25 DIAGNOSIS — M6281 Muscle weakness (generalized): Secondary | ICD-10-CM | POA: Diagnosis not present

## 2019-07-25 DIAGNOSIS — R293 Abnormal posture: Secondary | ICD-10-CM

## 2019-07-25 DIAGNOSIS — R262 Difficulty in walking, not elsewhere classified: Secondary | ICD-10-CM

## 2019-07-25 DIAGNOSIS — M79605 Pain in left leg: Secondary | ICD-10-CM

## 2019-07-25 NOTE — Therapy (Signed)
[facility] [address], [place], [place], [address] Phone: [phone]   Fax:  [phone]  Physical Therapy Treatment  Patient Details  Name: [name] MRN: [id number] Date of Birth: 24-Apr-1945 Referring Provider (PT): Rennis Harding, MD   Encounter Date: 07/25/2019   PT End of Session - 07/25/19 1700    Visit Number 11    Date for PT Re-Evaluation 09/05/19    Authorization Type Medicare/BCBS    Authorization Time Period 06/05/19 to 09/05/19    Authorization - Visit Number 11    Progress Note Due on Visit 20    PT Start Time 1445   Pt 15 min late due to weather   PT Stop Time 1515    PT Time Calculation (min) 30 min    Activity Tolerance Patient tolerated treatment well    Behavior During Therapy [facility] for tasks assessed/performed           Past Medical History:  Diagnosis Date  . Congenital musculoskeletal deformity of spine   . Hypertension   . Incontinence of bowel 09/2013  . Lumbago   . Lumbosacral spondylosis without myelopathy   . Sleep disorder    uses gabapentin at bedtime   . Spinal stenosis, lumbar region, without neurogenic claudication     Past Surgical History:  Procedure Laterality Date  . ANKLE SURGERY Right 2015   lengthened the achilles   . AUGMENTATION MAMMAPLASTY Bilateral 2000 or 2001 unsure    Left Implant has busted and is no longer visible  . BACK SURGERY  2015   fusion , Dr Patrice Paradise at [facility]; has 2 metal rods in place , fusion from t2 to s1   . BACK SURGERY  01/2017   Dr Rennis Harding ; repair of cracked titanium rod in back lumbar   . bunionectomy  Bilateral 1980s   multiple   . COLONOSCOPY     with polypectomy ; q 5 years   . FOOT SURGERY  07/2011  . SHOULDER SURGERY Left 2013   rotator cuff   . TOTAL HIP ARTHROPLASTY Right 05/08/2017   Procedure: RIGHT TOTAL HIP ARTHROPLASTY ANTERIOR APPROACH;  Surgeon: Paralee Cancel, MD;  Location: [facility];  Service: Orthopedics;   Laterality: Right;  70 mins    There were no vitals filed for this visit.   Subjective Assessment - 07/25/19 1705    Subjective I actually felt really good for days after last session. I am late because of the heavy rain.    Pertinent History rods/fusion T9 to pelvis    Currently in Pain? No/denies          Treatment: Aquatics Water temp 87.6 F Pt entered & exited pool via 3 steps with heavy use of hand rails  Standing in mid chest depth: Walking in all 4 directions using the blue noodle to help correct posture, full length of pool. High knee marching with use of noodle for posture: 1/2 pool length 2x Thoracic stretching/standing childs pose 10 sec 3x, then in between exercises PRN Decompression float with noodle 2 min Post pelvic tilt against pool wall with noodle press for core contraction 10x 3 sec hold Black UE weights breast stroke arms/rows 1 min ineach direction                             PT Short Term Goals - 07/23/19 1141      PT SHORT TERM GOAL #  1   Title Pt will be independent and consistent with her initial HEP to increase LE strength and posture.    Time 6    Period Weeks    Status Achieved    Target Date 07/22/18      PT SHORT TERM GOAL #2   Title Pt will be able to complete sit to stand without significant weight shift to the Lt which will improve her efficiency with daily activity.    Time 6    Period Weeks    Status Achieved   Min weight shift            PT Long Term Goals - 07/23/19 1141      PT LONG TERM GOAL #1   Title Pt will be able to ambulate 250f without the need for AD and with adequate foot clearance, minimal trunk lean.    Time 12    Period Weeks    Status Partially Met   Not consistently: fatfigue factore as well as what she has already done that day     PT LONG TERM GOAL #2   Title Pt will have 4+/5 MMT strength of the LEs for improved mechanics with daily activity.    Period Weeks    Status Partially  Met      PT LONG TERM GOAL #3   Title Pt will have no more than 3 inch distance from C7 to the wall when standing with her back facing the wall, to reflect improvements in her posture and endurance.    Period Weeks    Status Partially Met      PT LONG TERM GOAL #4   Title Pt will be able to go 4 hours without her wearing her back brace during the day.    Time 12    Period Weeks    Status Partially Met   Has not worn in last five days, but pt not confident she wil stick with it. May go back to wearing for certain activities.     PT LONG TERM GOAL #5   Title Pt will perform 5 x sit to stand in less than 13 seconds    Period Weeks    Status Achieved   11 sec                Plan - 07/25/19 1701    Clinical Impression Statement Pt arrives late for her aquatic session. Pt reports three good days and attributes it to more stretching. Pt did well with all exercises in the pool until  rows at the very end. After performing her rows she complained of pain right below her scapula. Taking short stretch breaks for her mid back appeared to help her exercise more comfortably as well as her decompression positions using the noodle. Pt demonstrated erect posture in the pool with assistance of the noodle.    Personal Factors and Comorbidities Age;Fitness;Comorbidity 2;Past/Current Experience    Comorbidities Rt THA, T9-pelvis fusion    Examination-Activity Limitations Locomotion Level;Transfers    Stability/Clinical Decision Making Evolving/Moderate complexity    PT Frequency 2x / week    PT Duration 12 weeks    PT Treatment/Interventions ADLs/Self Care Home Management;Aquatic Therapy;Cryotherapy;Moist Heat;Electrical Stimulation;Therapeutic activities;Therapeutic exercise;Balance training;Gait training;Neuromuscular re-education;Manual techniques;Dry needling;Taping;Patient/family education    PT Next Visit Plan Continue with trunk strength and flexibility    PT Home Exercise Plan [id number]     Consulted and Agree with Plan of Care Patient  Patient will benefit from skilled therapeutic intervention in order to improve the following deficits and impairments:  Abnormal gait, Improper body mechanics, Pain, Increased muscle spasms, Decreased strength, Decreased range of motion, Decreased activity tolerance, Hypomobility, Impaired flexibility, Difficulty walking, Decreased balance  Visit Diagnosis: Muscle weakness (generalized)  Abnormal posture  Difficulty in walking, not elsewhere classified  Pain in left leg     Problem List Patient Active Problem List   Diagnosis Date Noted  . Hip contracture, unspecified laterality 07/02/2018  . Overweight (BMI 25.0-29.9) 05/09/2017  . S/P right THA, AA 05/08/2017  . Chronic bilateral low back pain without sciatica 04/05/2015  . Disorder of sacroiliac joint 12/22/2014  . Chronic low back pain 12/22/2014  . Acquired scoliosis 11/27/2011  . Lumbosacral spondylosis without myelopathy 05/22/2011    [name], [name] 07/25/2019, 5:11 PM  [facility] [address], [place], [place], [address] Phone: [phone]   Fax:  [phone]  Name: [name] MRN: [id number] Date of Birth: [birth year]

## 2019-07-30 ENCOUNTER — Ambulatory Visit: Payer: Medicare Other | Admitting: Physical Therapy

## 2019-07-30 ENCOUNTER — Encounter: Payer: Self-pay | Admitting: Physical Therapy

## 2019-07-30 ENCOUNTER — Other Ambulatory Visit: Payer: Self-pay

## 2019-07-30 DIAGNOSIS — M6281 Muscle weakness (generalized): Secondary | ICD-10-CM

## 2019-07-30 DIAGNOSIS — R262 Difficulty in walking, not elsewhere classified: Secondary | ICD-10-CM

## 2019-07-30 DIAGNOSIS — M79605 Pain in left leg: Secondary | ICD-10-CM

## 2019-07-30 DIAGNOSIS — R293 Abnormal posture: Secondary | ICD-10-CM

## 2019-07-30 NOTE — Therapy (Signed)
[facility] [address], [place], [place], [address] Phone: [phone]   Fax:  [phone]  Physical Therapy Treatment  Patient Details  Name: [name] MRN: [id number] Date of Birth: June 19, 1945 Referring Provider (PT): Rennis Harding, MD   Encounter Date: 07/30/2019   PT End of Session - 07/30/19 1103    Visit Number 12    Date for PT Re-Evaluation 09/05/19    Authorization Type Medicare/BCBS    Authorization Time Period 06/05/19 to 09/05/19    Authorization - Visit Number 12    Progress Note Due on Visit 20    PT Start Time 1100    PT Stop Time 1144    PT Time Calculation (min) 44 min    Activity Tolerance Patient tolerated treatment well    Behavior During Therapy [facility] for tasks assessed/performed           Past Medical History:  Diagnosis Date  . Congenital musculoskeletal deformity of spine   . Hypertension   . Incontinence of bowel 09/2013  . Lumbago   . Lumbosacral spondylosis without myelopathy   . Sleep disorder    uses gabapentin at bedtime   . Spinal stenosis, lumbar region, without neurogenic claudication     Past Surgical History:  Procedure Laterality Date  . ANKLE SURGERY Right 2015   lengthened the achilles   . AUGMENTATION MAMMAPLASTY Bilateral 2000 or 2001 unsure    Left Implant has busted and is no longer visible  . BACK SURGERY  2015   fusion , Dr Patrice Paradise at [facility]; has 2 metal rods in place , fusion from t2 to s1   . BACK SURGERY  01/2017   Dr Rennis Harding ; repair of cracked titanium rod in back lumbar   . bunionectomy  Bilateral 1980s   multiple   . COLONOSCOPY     with polypectomy ; q 5 years   . FOOT SURGERY  07/2011  . SHOULDER SURGERY Left 2013   rotator cuff   . TOTAL HIP ARTHROPLASTY Right 05/08/2017   Procedure: RIGHT TOTAL HIP ARTHROPLASTY ANTERIOR APPROACH;  Surgeon: Paralee Cancel, MD;  Location: [facility];  Service: Orthopedics;  Laterality: Right;  70 mins      There were no vitals filed for this visit.   Subjective Assessment - 07/30/19 1105    Subjective i felt really good after the pool, i was super tired but no pain. I have no pain today, just feel a little tight. i have decided to stop wearing my shoe with the lift.    Pertinent History rods/fusion T9 to pelvis    Limitations Walking    Currently in Pain? No/denies    Pain Location Back    Pain Orientation Mid    Pain Descriptors / Indicators Tightness                             [facility] Adult PT Treatment/Exercise - 07/30/19 0001      Knee/Hip Exercises: Stretches   Other Knee/Hip Stretches Supine leg lengtheer strtech to decompress 5x5 sec Bil    Other Knee/Hip Stretches Lt adductor butterfly stretch 2x30 sec hold       Knee/Hip Exercises: Aerobic   Stationary Bike l4 x 10 min with discussion about shoe lift and pain assessment.      Knee/Hip Exercises: Machines for Strengthening   Cybex Leg Press Seat 7; BilLE 130# 2x15, 110# Single leg  10x2 Bil        Knee/Hip Exercises: Seated   Other Seated Knee/Hip Exercises lat stretch over foam roll 5x10 sec hold    Vc to sit back into hips     Knee/Hip Exercises: Supine   Bridges Strengthening;Both;1 set;15 reps    Bridges Limitations Knee to chest stretch post 2x 10 sec      Knee/Hip Exercises: Sidelying   Hip ABduction Strengthening;Right;Left;2 sets;10 reps    Hip ABduction Limitations LT knee bent, VC for greater hip extension                     PT Short Term Goals - 07/23/19 1141      PT SHORT TERM GOAL #1   Title Pt will be independent and consistent with her initial HEP to increase LE strength and posture.    Time 6    Period Weeks    Status Achieved    Target Date 07/22/18      PT SHORT TERM GOAL #2   Title Pt will be able to complete sit to stand without significant weight shift to the Lt which will improve her efficiency with daily activity.    Time 6    Period Weeks    Status  Achieved   Min weight shift            PT Long Term Goals - 07/23/19 1141      PT LONG TERM GOAL #1   Title Pt will be able to ambulate 220f without the need for AD and with adequate foot clearance, minimal trunk lean.    Time 12    Period Weeks    Status Partially Met   Not consistently: fatfigue factore as well as what she has already done that day     PT LONG TERM GOAL #2   Title Pt will have 4+/5 MMT strength of the LEs for improved mechanics with daily activity.    Period Weeks    Status Partially Met      PT LONG TERM GOAL #3   Title Pt will have no more than 3 inch distance from C7 to the wall when standing with her back facing the wall, to reflect improvements in her posture and endurance.    Period Weeks    Status Partially Met      PT LONG TERM GOAL #4   Title Pt will be able to go 4 hours without her wearing her back brace during the day.    Time 12    Period Weeks    Status Partially Met   Has not worn in last five days, but pt not confident she wil stick with it. May go back to wearing for certain activities.     PT LONG TERM GOAL #5   Title Pt will perform 5 x sit to stand in less than 13 seconds    Period Weeks    Status Achieved   11 sec                Plan - 07/30/19 1103    Clinical Impression Statement Pt arrives with some tightness in her back but no "real pain today." She has decided to stop wearing her shoe withher lift. She feels that makes her pain worse and is better without it. Pt was able to increase her resistance on the leg press. She cannot enage her Lt glute medius at all and relies mainly on her RF and TFL which  feels like it is shortening and is very tight. Pt could do a little better if she bent her knee to abduct, there was a trace contration. She will work on this at home.    Personal Factors and Comorbidities Age;Fitness;Comorbidity 2;Past/Current Experience    Comorbidities Rt THA, T9-pelvis fusion    Examination-Activity  Limitations Locomotion Level;Transfers    Stability/Clinical Decision Making Evolving/Moderate complexity    Rehab Potential Good    PT Frequency 2x / week    PT Duration 12 weeks    PT Treatment/Interventions ADLs/Self Care Home Management;Aquatic Therapy;Cryotherapy;Moist Heat;Electrical Stimulation;Therapeutic activities;Therapeutic exercise;Balance training;Gait training;Neuromuscular re-education;Manual techniques;Dry needling;Taping;Patient/family education    PT Next Visit Plan Aquatics next session    PT Home Exercise Plan [email or web address]    Consulted and Agree with Plan of Care Patient           Patient will benefit from skilled therapeutic intervention in order to improve the following deficits and impairments:  Abnormal gait, Improper body mechanics, Pain, Increased muscle spasms, Decreased strength, Decreased range of motion, Decreased activity tolerance, Hypomobility, Impaired flexibility, Difficulty walking, Decreased balance  Visit Diagnosis: Muscle weakness (generalized)  Abnormal posture  Difficulty in walking, not elsewhere classified  Pain in left leg     Problem List Patient Active Problem List   Diagnosis Date Noted  . Hip contracture, unspecified laterality 07/02/2018  . Overweight (BMI 25.0-29.9) 05/09/2017  . S/P right THA, AA 05/08/2017  . Chronic bilateral low back pain without sciatica 04/05/2015  . Disorder of sacroiliac joint 12/22/2014  . Chronic low back pain 12/22/2014  . Acquired scoliosis 11/27/2011  . Lumbosacral spondylosis without myelopathy 05/22/2011    Princetta Uplinger, PTA 07/30/2019, 2:00 PM  [facility] [address] [place], [place], [address] Phone: [phone]   Fax:  [phone]  Name: Gary Bultman MRN: [id number] Date of Birth: 06-30-1945

## 2019-08-01 ENCOUNTER — Encounter: Payer: Self-pay | Admitting: Physical Therapy

## 2019-08-01 ENCOUNTER — Other Ambulatory Visit: Payer: Self-pay

## 2019-08-01 ENCOUNTER — Ambulatory Visit: Payer: Medicare Other | Admitting: Physical Therapy

## 2019-08-01 DIAGNOSIS — M6281 Muscle weakness (generalized): Secondary | ICD-10-CM

## 2019-08-01 DIAGNOSIS — M79605 Pain in left leg: Secondary | ICD-10-CM

## 2019-08-01 DIAGNOSIS — R262 Difficulty in walking, not elsewhere classified: Secondary | ICD-10-CM

## 2019-08-01 DIAGNOSIS — R293 Abnormal posture: Secondary | ICD-10-CM

## 2019-08-01 NOTE — Therapy (Signed)
[facility] [address] [place], [place], [address] Phone: [phone]   Fax:  [phone]  Physical Therapy Treatment  Patient Details  Name: [name] MRN: [id number] Date of Birth: 12-Oct-1945 Referring Provider (PT): Rennis Harding, MD   Encounter Date: 08/01/2019   PT End of Session - 08/01/19 1608    Visit Number 13    Date for PT Re-Evaluation 09/05/19    Authorization Type Medicare/BCBS    Authorization Time Period 06/05/19 to 09/05/19    Authorization - Visit Number 13    Progress Note Due on Visit 20    PT Start Time 8182    PT Stop Time 1430    PT Time Calculation (min) 45 min    Activity Tolerance Patient tolerated treatment well    Behavior During Therapy [facility] for tasks assessed/performed           Past Medical History:  Diagnosis Date  . Congenital musculoskeletal deformity of spine   . Hypertension   . Incontinence of bowel 09/2013  . Lumbago   . Lumbosacral spondylosis without myelopathy   . Sleep disorder    uses gabapentin at bedtime   . Spinal stenosis, lumbar region, without neurogenic claudication     Past Surgical History:  Procedure Laterality Date  . ANKLE SURGERY Right 2015   lengthened the achilles   . AUGMENTATION MAMMAPLASTY Bilateral 2000 or 2001 unsure    Left Implant has busted and is no longer visible  . BACK SURGERY  2015   fusion , Dr Patrice Paradise at [facility]; has 2 metal rods in place , fusion from t2 to s1   . BACK SURGERY  01/2017   Dr Rennis Harding ; repair of cracked titanium rod in back lumbar   . bunionectomy  Bilateral 1980s   multiple   . COLONOSCOPY     with polypectomy ; q 5 years   . FOOT SURGERY  07/2011  . SHOULDER SURGERY Left 2013   rotator cuff   . TOTAL HIP ARTHROPLASTY Right 05/08/2017   Procedure: RIGHT TOTAL HIP ARTHROPLASTY ANTERIOR APPROACH;  Surgeon: Paralee Cancel, MD;  Location: [facility];  Service: Orthopedics;  Laterality: Right;  70 mins      There were no vitals filed for this visit.   Subjective Assessment - 08/01/19 1607    Subjective Sore inher mid back Lt > RT after last land session.    Pertinent History rods/fusion T9 to pelvis    Currently in Pain? Yes    Pain Score 5     Pain Location Back    Pain Orientation Mid    Pain Descriptors / Indicators Sore;Tightness    Aggravating Factors  Constant    Pain Relieving Factors rest          Treatment: Aquatics Water temp 87.6 F Pt enters pool via 3 steps using rails heavily  Seated at water bench: Sit to stand 2x10, chest press/pull with water weights 20x Standing in mid waist depth: 4 lengths of water walking with noodle for postural support. Pt required stretching and/or decompression float to modulate her Lt mid back pain & tightness. VC to engage core helpful during the walking, Lt hip abduction 10x, UE support required. Standing static and dynamic marching 1/4 length of pool, used blue noodle for postural support.  PT Short Term Goals - 07/23/19 1141      PT SHORT TERM GOAL #1   Title Pt will be independent and consistent with her initial HEP to increase LE strength and posture.    Time 6    Period Weeks    Status Achieved    Target Date 07/22/18      PT SHORT TERM GOAL #2   Title Pt will be able to complete sit to stand without significant weight shift to the Lt which will improve her efficiency with daily activity.    Time 6    Period Weeks    Status Achieved   Min weight shift            PT Long Term Goals - 07/23/19 1141      PT LONG TERM GOAL #1   Title Pt will be able to ambulate 275f without the need for AD and with adequate foot clearance, minimal trunk lean.    Time 12    Period Weeks    Status Partially Met   Not consistently: fatfigue factore as well as what she has already done that day     PT LONG TERM GOAL #2   Title Pt will have 4+/5 MMT strength of the LEs for improved  mechanics with daily activity.    Period Weeks    Status Partially Met      PT LONG TERM GOAL #3   Title Pt will have no more than 3 inch distance from C7 to the wall when standing with her back facing the wall, to reflect improvements in her posture and endurance.    Period Weeks    Status Partially Met      PT LONG TERM GOAL #4   Title Pt will be able to go 4 hours without her wearing her back brace during the day.    Time 12    Period Weeks    Status Partially Met   Has not worn in last five days, but pt not confident she wil stick with it. May go back to wearing for certain activities.     PT LONG TERM GOAL #5   Title Pt will perform 5 x sit to stand in less than 13 seconds    Period Weeks    Status Achieved   11 sec                Plan - 08/01/19 1609    Clinical Impression Statement Pt arrives sore for aquatics today. She required frequent stretching breaks for decompression floats especially after an exercise that required postural emphasis. Pt reports working on her hip abduction at home, only feeling little muscle contraction.    Personal Factors and Comorbidities Age;Fitness;Comorbidity 2;Past/Current Experience    Comorbidities Rt THA, T9-pelvis fusion    Examination-Activity Limitations Locomotion Level;Transfers    Stability/Clinical Decision Making Evolving/Moderate complexity    Rehab Potential Good    PT Frequency 2x / week    PT Duration 12 weeks    PT Treatment/Interventions ADLs/Self Care Home Management;Aquatic Therapy;Cryotherapy;Moist Heat;Electrical Stimulation;Therapeutic activities;Therapeutic exercise;Balance training;Gait training;Neuromuscular re-education;Manual techniques;Dry needling;Taping;Patient/family education    PT Next Visit Plan Land session next    PT Home Exercise Plan [email or web address]    Consulted and Agree with Plan of Care Patient           Patient will benefit from skilled therapeutic intervention in order to improve the following  deficits and impairments:  Abnormal gait, Improper body mechanics, Pain, Increased  muscle spasms, Decreased strength, Decreased range of motion, Decreased activity tolerance, Hypomobility, Impaired flexibility, Difficulty walking, Decreased balance  Visit Diagnosis: Muscle weakness (generalized)  Abnormal posture  Difficulty in walking, not elsewhere classified  Pain in left leg     Problem List Patient Active Problem List   Diagnosis Date Noted  . Hip contracture, unspecified laterality 07/02/2018  . Overweight (BMI 25.0-29.9) 05/09/2017  . S/P right THA, AA 05/08/2017  . Chronic bilateral low back pain without sciatica 04/05/2015  . Disorder of sacroiliac joint 12/22/2014  . Chronic low back pain 12/22/2014  . Acquired scoliosis 11/27/2011  . Lumbosacral spondylosis without myelopathy 05/22/2011    Georjean Toya, PTA 08/01/2019, 4:13 PM  [facility] [address], [place], [place], [address] Phone: [phone]   Fax:  [phone]  Name: [name] MRN: [id number] Date of Birth: [birth year]

## 2019-08-05 ENCOUNTER — Encounter: Payer: Self-pay | Admitting: Physical Therapy

## 2019-08-05 ENCOUNTER — Ambulatory Visit: Payer: Medicare Other | Admitting: Physical Therapy

## 2019-08-05 ENCOUNTER — Other Ambulatory Visit: Payer: Self-pay

## 2019-08-05 DIAGNOSIS — R262 Difficulty in walking, not elsewhere classified: Secondary | ICD-10-CM

## 2019-08-05 DIAGNOSIS — R293 Abnormal posture: Secondary | ICD-10-CM

## 2019-08-05 DIAGNOSIS — M6281 Muscle weakness (generalized): Secondary | ICD-10-CM

## 2019-08-05 DIAGNOSIS — M79605 Pain in left leg: Secondary | ICD-10-CM

## 2019-08-05 NOTE — Patient Instructions (Signed)
Access Code: [email or web address] URL: [email or web address] Date: 08/05/2019 Prepared by: [facility]  Exercises .Sit to Stand - 1 x daily - 7 x weekly - 10 reps - 3 sets .Standing Anti-Rotation Press with Anchored Resistance - 1 x daily - 7 x weekly - 3 sets - 10 reps .Doorway Pec Stretch at 60 Elevation - 1 x daily - 7 x weekly - 3 sets - 10 reps .Sidelying Open Book Thoracic Lumbar Rotation and Extension - 2 x daily - 7 x weekly - 10 reps .Supine Butterfly Groin Stretch - 1 x daily - 7 x weekly - 10 reps - 10 seconds hold .Standing Row with Anchored Resistance - 1 x daily - 7 x weekly - 3 sets - 10 reps   [facility] [address], [place], [place] [address] Phone # [phone] Fax [phone]

## 2019-08-06 NOTE — Therapy (Signed)
[facility] [address] [place], [place], [address] Phone: [phone]   Fax:  [phone]  Physical Therapy Treatment  Patient Details  Name: [name] MRN: [id number] Date of Birth: February 12, 1945 Referring Provider (PT): Rennis Harding, MD   Encounter Date: 08/05/2019   PT End of Session - 08/06/19 1942    Visit Number 14    Date for PT Re-Evaluation 09/05/19    Authorization Type Medicare/BCBS    Authorization Time Period 06/05/19 to 09/05/19    Authorization - Visit Number 14    Progress Note Due on Visit 20    PT Start Time 1147    PT Stop Time 1230    PT Time Calculation (min) 43 min    Activity Tolerance Patient tolerated treatment well    Behavior During Therapy [facility] for tasks assessed/performed           Past Medical History:  Diagnosis Date  . Congenital musculoskeletal deformity of spine   . Hypertension   . Incontinence of bowel 09/2013  . Lumbago   . Lumbosacral spondylosis without myelopathy   . Sleep disorder    uses gabapentin at bedtime   . Spinal stenosis, lumbar region, without neurogenic claudication     Past Surgical History:  Procedure Laterality Date  . ANKLE SURGERY Right 2015   lengthened the achilles   . AUGMENTATION MAMMAPLASTY Bilateral 2000 or 2001 unsure    Left Implant has busted and is no longer visible  . BACK SURGERY  2015   fusion , Dr Patrice Paradise at [facility]; has 2 metal rods in place , fusion from t2 to s1   . BACK SURGERY  01/2017   Dr Rennis Harding ; repair of cracked titanium rod in back lumbar   . bunionectomy  Bilateral 1980s   multiple   . COLONOSCOPY     with polypectomy ; q 5 years   . FOOT SURGERY  07/2011  . SHOULDER SURGERY Left 2013   rotator cuff   . TOTAL HIP ARTHROPLASTY Right 05/08/2017   Procedure: RIGHT TOTAL HIP ARTHROPLASTY ANTERIOR APPROACH;  Surgeon: Paralee Cancel, MD;  Location: [facility];  Service: Orthopedics;  Laterality: Right;  70 mins      There were no vitals filed for this visit.   Subjective Assessment - 08/06/19 1956    Subjective Pt states that things are going well. She is going to have to work on getting her shoe lift adjusted. She is having trouble getting her Lt glute to fire.    Pertinent History rods/fusion T9 to pelvis    Currently in Pain? No/denies                             [facility] Adult PT Treatment/Exercise - 08/06/19 0001      Knee/Hip Exercises: Aerobic   Nustep L5, x7 min PT present to discuss progress       Knee/Hip Exercises: Machines for Strengthening   Cybex Leg Press seat 7: B LE #150 x15 reps, #170 2x10 reps; single Lt and Rt #110 3x10 reps (PT cuing lateral knee)       Knee/Hip Exercises: Standing   Other Standing Knee Exercises red TB pallof press x15 reps pull to the Rt x15 reps     Other Standing Knee Exercises Lt hip slider abduction and diagonal x10 reps, PT preventing trunk lean       Knee/Hip Exercises: Seated  Sit to Sand 1 set;10 reps   red TB around knees                  PT Education - 08/06/19 1953    Education Details technique with therex    Person(s) Educated Patient    Methods Explanation;Handout    Comprehension Verbalized understanding;Returned demonstration            PT Short Term Goals - 07/23/19 1141      PT SHORT TERM GOAL #1   Title Pt will be independent and consistent with her initial HEP to increase LE strength and posture.    Time 6    Period Weeks    Status Achieved    Target Date 07/22/18      PT SHORT TERM GOAL #2   Title Pt will be able to complete sit to stand without significant weight shift to the Lt which will improve her efficiency with daily activity.    Time 6    Period Weeks    Status Achieved   Min weight shift            PT Long Term Goals - 07/23/19 1141      PT LONG TERM GOAL #1   Title Pt will be able to ambulate 265f without the need for AD and with adequate foot clearance, minimal trunk  lean.    Time 12    Period Weeks    Status Partially Met   Not consistently: fatfigue factore as well as what she has already done that day     PT LONG TERM GOAL #2   Title Pt will have 4+/5 MMT strength of the LEs for improved mechanics with daily activity.    Period Weeks    Status Partially Met      PT LONG TERM GOAL #3   Title Pt will have no more than 3 inch distance from C7 to the wall when standing with her back facing the wall, to reflect improvements in her posture and endurance.    Period Weeks    Status Partially Met      PT LONG TERM GOAL #4   Title Pt will be able to go 4 hours without her wearing her back brace during the day.    Time 12    Period Weeks    Status Partially Met   Has not worn in last five days, but pt not confident she wil stick with it. May go back to wearing for certain activities.     PT LONG TERM GOAL #5   Title Pt will perform 5 x sit to stand in less than 13 seconds    Period Weeks    Status Achieved   11 sec                Plan - 08/06/19 1943    Clinical Impression Statement Pt continues to work on her posture throughout the day. She is standing more upright during ambulation and static standing during today's session. Pt was able to complete exercise progressions with LE and trunk strengthening without increase in back pain or significant compensation. PT made modifications to exercises to increase glute med activation. Pt would continue to benefit from skilled PT to promote LE and trunk strength and endurance.    Personal Factors and Comorbidities Age;Fitness;Comorbidity 2;Past/Current Experience    Comorbidities Rt THA, T9-pelvis fusion    Examination-Activity Limitations Locomotion Level;Transfers    Stability/Clinical Decision Making Evolving/Moderate complexity  Rehab Potential Good    PT Frequency 2x / week    PT Duration 12 weeks    PT Treatment/Interventions ADLs/Self Care Home Management;Aquatic Therapy;Cryotherapy;Moist  Heat;Electrical Stimulation;Therapeutic activities;Therapeutic exercise;Balance training;Gait training;Neuromuscular re-education;Manual techniques;Dry needling;Taping;Patient/family education    PT Next Visit Plan glute med isometric strength in different positions; f/u on pt completing entire HEP; trunk strength progressions    PT Home Exercise Plan [email or web address]    Consulted and Agree with Plan of Care Patient           Patient will benefit from skilled therapeutic intervention in order to improve the following deficits and impairments:  Abnormal gait, Improper body mechanics, Pain, Increased muscle spasms, Decreased strength, Decreased range of motion, Decreased activity tolerance, Hypomobility, Impaired flexibility, Difficulty walking, Decreased balance  Visit Diagnosis: Muscle weakness (generalized)  Abnormal posture  Difficulty in walking, not elsewhere classified  Pain in left leg     Problem List Patient Active Problem List   Diagnosis Date Noted  . Hip contracture, unspecified laterality 07/02/2018  . Overweight (BMI 25.0-29.9) 05/09/2017  . S/P right THA, AA 05/08/2017  . Chronic bilateral low back pain without sciatica 04/05/2015  . Disorder of sacroiliac joint 12/22/2014  . Chronic low back pain 12/22/2014  . Acquired scoliosis 11/27/2011  . Lumbosacral spondylosis without myelopathy 05/22/2011    7:57 PM,08/06/19 Sherol Dade PT, DPT [facility] [address], [place], [place], [address] Phone: [phone]   Fax:  [phone]  Name: Aracelia Brinson MRN: [id number] Date of Birth: 1945/10/26

## 2019-08-08 ENCOUNTER — Ambulatory Visit: Payer: Medicare Other | Admitting: Physical Therapy

## 2019-08-11 ENCOUNTER — Other Ambulatory Visit: Payer: Self-pay

## 2019-08-11 ENCOUNTER — Ambulatory Visit: Payer: Medicare Other | Attending: Orthopaedic Surgery | Admitting: Physical Therapy

## 2019-08-11 ENCOUNTER — Encounter: Payer: Self-pay | Admitting: Physical Therapy

## 2019-08-11 DIAGNOSIS — R262 Difficulty in walking, not elsewhere classified: Secondary | ICD-10-CM | POA: Diagnosis present

## 2019-08-11 DIAGNOSIS — M79605 Pain in left leg: Secondary | ICD-10-CM | POA: Insufficient documentation

## 2019-08-11 DIAGNOSIS — M6281 Muscle weakness (generalized): Secondary | ICD-10-CM | POA: Diagnosis not present

## 2019-08-11 DIAGNOSIS — R293 Abnormal posture: Secondary | ICD-10-CM

## 2019-08-11 NOTE — Therapy (Signed)
[facility] [address] [place], [place], [address] Phone: [phone]   Fax:  [phone]  Physical Therapy Treatment  Patient Details  Name: [name] MRN: [id number] Date of Birth: 1945-07-25 Referring Provider (PT): Rennis Harding, MD   Encounter Date: 08/11/2019   PT End of Session - 08/11/19 1142    Visit Number 15    Date for PT Re-Evaluation 09/05/19    Authorization Type Medicare/BCBS    Authorization Time Period 06/05/19 to 09/05/19    Authorization - Visit Number 15    Progress Note Due on Visit 20    PT Start Time 7681    PT Stop Time 1217    PT Time Calculation (min) 42 min    Activity Tolerance Patient tolerated treatment well    Behavior During Therapy [facility] for tasks assessed/performed           Past Medical History:  Diagnosis Date  . Congenital musculoskeletal deformity of spine   . Hypertension   . Incontinence of bowel 09/2013  . Lumbago   . Lumbosacral spondylosis without myelopathy   . Sleep disorder    uses gabapentin at bedtime   . Spinal stenosis, lumbar region, without neurogenic claudication     Past Surgical History:  Procedure Laterality Date  . ANKLE SURGERY Right 2015   lengthened the achilles   . AUGMENTATION MAMMAPLASTY Bilateral 2000 or 2001 unsure    Left Implant has busted and is no longer visible  . BACK SURGERY  2015   fusion , Dr Patrice Paradise at [facility]; has 2 metal rods in place , fusion from t2 to s1   . BACK SURGERY  01/2017   Dr Rennis Harding ; repair of cracked titanium rod in back lumbar   . bunionectomy  Bilateral 1980s   multiple   . COLONOSCOPY     with polypectomy ; q 5 years   . FOOT SURGERY  07/2011  . SHOULDER SURGERY Left 2013   rotator cuff   . TOTAL HIP ARTHROPLASTY Right 05/08/2017   Procedure: RIGHT TOTAL HIP ARTHROPLASTY ANTERIOR APPROACH;  Surgeon: Paralee Cancel, MD;  Location: [facility];  Service: Orthopedics;  Laterality: Right;  70 mins     There were no vitals filed for this visit.   Subjective Assessment - 08/11/19 1144    Subjective I sewed for hours yesterday, probably overdid it. So  I am sore today.    Pertinent History rods/fusion T9 to pelvis    Currently in Pain? Yes    Pain Score 3     Pain Location Back    Pain Orientation Mid    Pain Descriptors / Indicators Sore    Aggravating Factors  Constant, overdoing    Pain Relieving Factors rest    Multiple Pain Sites No                             [facility] Adult PT Treatment/Exercise - 08/11/19 0001      Knee/Hip Exercises: Aerobic   Nustep L5, x10 min PTA present to discuss progress       Knee/Hip Exercises: Machines for Strengthening   Cybex Leg Press seat 7 Bil 170# 15x, 180# 10x: Lt/RT 110#  3x 10       Knee/Hip Exercises: Standing   Other Standing Knee Exercises red TB pallof press x15 reps pull to the Rt x15 reps     Other Standing Knee  Exercises Lt hip slider abdution 2x10 diagonals 2x10       Knee/Hip Exercises: Seated   Sit to Sand 1 set;20 reps   red loop around knees      Shoulder Exercises: Stretch   Other Shoulder Stretches Seated lat stretch 10x 8 sec hold  with blue roll                    PT Short Term Goals - 07/23/19 1141      PT SHORT TERM GOAL #1   Title Pt will be independent and consistent with her initial HEP to increase LE strength and posture.    Time 6    Period Weeks    Status Achieved    Target Date 07/22/18      PT SHORT TERM GOAL #2   Title Pt will be able to complete sit to stand without significant weight shift to the Lt which will improve her efficiency with daily activity.    Time 6    Period Weeks    Status Achieved   Min weight shift            PT Long Term Goals - 07/23/19 1141      PT LONG TERM GOAL #1   Title Pt will be able to ambulate 275f without the need for AD and with adequate foot clearance, minimal trunk lean.    Time 12    Period Weeks    Status Partially Met    Not consistently: fatfigue factore as well as what she has already done that day     PT LONG TERM GOAL #2   Title Pt will have 4+/5 MMT strength of the LEs for improved mechanics with daily activity.    Period Weeks    Status Partially Met      PT LONG TERM GOAL #3   Title Pt will have no more than 3 inch distance from C7 to the wall when standing with her back facing the wall, to reflect improvements in her posture and endurance.    Period Weeks    Status Partially Met      PT LONG TERM GOAL #4   Title Pt will be able to go 4 hours without her wearing her back brace during the day.    Time 12    Period Weeks    Status Partially Met   Has not worn in last five days, but pt not confident she wil stick with it. May go back to wearing for certain activities.     PT LONG TERM GOAL #5   Title Pt will perform 5 x sit to stand in less than 13 seconds    Period Weeks    Status Achieved   11 sec                Plan - 08/11/19 1143    Clinical Impression Statement pt arrives wearing her shoe with the lift. She reports the lift still doesn't feel quite right and has made an appt with the orthotist to discuss her options. Pt did not report increased pain with todays workout nor did she have to stop to stretch due to back pain.    Personal Factors and Comorbidities Age;Fitness;Comorbidity 2;Past/Current Experience    Comorbidities Rt THA, T9-pelvis fusion    Examination-Activity Limitations Locomotion Level;Transfers    Stability/Clinical Decision Making Evolving/Moderate complexity    Rehab Potential Good    PT Frequency 2x / week  PT Duration 12 weeks    PT Treatment/Interventions ADLs/Self Care Home Management;Aquatic Therapy;Cryotherapy;Moist Heat;Electrical Stimulation;Therapeutic activities;Therapeutic exercise;Balance training;Gait training;Neuromuscular re-education;Manual techniques;Dry needling;Taping;Patient/family education    PT Next Visit Plan glute med isometric  strength in different positions; f/u on pt completing entire HEP; trunk strength progressions    PT Home Exercise Plan [email or web address]    Consulted and Agree with Plan of Care Patient           Patient will benefit from skilled therapeutic intervention in order to improve the following deficits and impairments:  Abnormal gait, Improper body mechanics, Pain, Increased muscle spasms, Decreased strength, Decreased range of motion, Decreased activity tolerance, Hypomobility, Impaired flexibility, Difficulty walking, Decreased balance  Visit Diagnosis: Muscle weakness (generalized)  Abnormal posture  Difficulty in walking, not elsewhere classified     Problem List Patient Active Problem List   Diagnosis Date Noted  . Hip contracture, unspecified laterality 07/02/2018  . Overweight (BMI 25.0-29.9) 05/09/2017  . S/P right THA, AA 05/08/2017  . Chronic bilateral low back pain without sciatica 04/05/2015  . Disorder of sacroiliac joint 12/22/2014  . Chronic low back pain 12/22/2014  . Acquired scoliosis 11/27/2011  . Lumbosacral spondylosis without myelopathy 05/22/2011    Jaleea Alesi, PTA 08/11/2019, 12:18 PM  [facility] [address] [place], [place], [address] Phone: [phone]   Fax:  [phone]  Name: Carmelita Amparo MRN: [id number] Date of Birth: 08/11/1945

## 2019-08-13 ENCOUNTER — Other Ambulatory Visit: Payer: Self-pay

## 2019-08-13 ENCOUNTER — Ambulatory Visit: Payer: Medicare Other | Admitting: Physical Therapy

## 2019-08-13 DIAGNOSIS — M79605 Pain in left leg: Secondary | ICD-10-CM

## 2019-08-13 DIAGNOSIS — M6281 Muscle weakness (generalized): Secondary | ICD-10-CM | POA: Diagnosis not present

## 2019-08-13 DIAGNOSIS — R293 Abnormal posture: Secondary | ICD-10-CM

## 2019-08-13 DIAGNOSIS — R262 Difficulty in walking, not elsewhere classified: Secondary | ICD-10-CM

## 2019-08-13 NOTE — Therapy (Signed)
[facility] [address], [place], [place], [address] Phone: [phone]   Fax:  [phone]  Physical Therapy Treatment  Patient Details  Name: [name] MRN: [id number] Date of Birth: Jun 13, 1945 Referring Provider (PT): Rennis Harding, MD   Encounter Date: 08/13/2019   PT End of Session - 08/13/19 1146    Visit Number 16    Date for PT Re-Evaluation 09/05/19    Authorization Type Medicare/BCBS    Authorization - Visit Number 16    Progress Note Due on Visit 20    PT Start Time 1140    PT Stop Time 1230    PT Time Calculation (min) 50 min    Activity Tolerance Patient tolerated treatment well    Behavior During Therapy [facility] for tasks assessed/performed           Past Medical History:  Diagnosis Date  . Congenital musculoskeletal deformity of spine   . Hypertension   . Incontinence of bowel 09/2013  . Lumbago   . Lumbosacral spondylosis without myelopathy   . Sleep disorder    uses gabapentin at bedtime   . Spinal stenosis, lumbar region, without neurogenic claudication     Past Surgical History:  Procedure Laterality Date  . ANKLE SURGERY Right 2015   lengthened the achilles   . AUGMENTATION MAMMAPLASTY Bilateral 2000 or 2001 unsure    Left Implant has busted and is no longer visible  . BACK SURGERY  2015   fusion , Dr Patrice Paradise at [facility]; has 2 metal rods in place , fusion from t2 to s1   . BACK SURGERY  01/2017   Dr Rennis Harding ; repair of cracked titanium rod in back lumbar   . bunionectomy  Bilateral 1980s   multiple   . COLONOSCOPY     with polypectomy ; q 5 years   . FOOT SURGERY  07/2011  . SHOULDER SURGERY Left 2013   rotator cuff   . TOTAL HIP ARTHROPLASTY Right 05/08/2017   Procedure: RIGHT TOTAL HIP ARTHROPLASTY ANTERIOR APPROACH;  Surgeon: Paralee Cancel, MD;  Location: [facility];  Service: Orthopedics;  Laterality: Right;  70 mins    There were no vitals filed for this  visit.   Subjective Assessment - 08/13/19 1151    Subjective i just overdo my sewing and it makes me sore.    Pertinent History rods/fusion T9 to pelvis    Currently in Pain? Yes    Pain Score 7     Pain Location Back    Pain Orientation Left;Mid    Pain Descriptors / Indicators Sore    Multiple Pain Sites No                             [facility] Adult PT Treatment/Exercise - 08/13/19 0001      Knee/Hip Exercises: Aerobic   Nustep L5, x10 min PTA present to discuss progress and pain/soreness      Knee/Hip Exercises: Machines for Strengthening   Cybex Leg Press Seat 180# 2x10: RT/LT 110# 2x10      Knee/Hip Exercises: Standing   Other Standing Knee Exercises Lt hip slider abdution 2x10 diagonals 2x10       Knee/Hip Exercises: Supine   Bridges Strengthening;Both;1 set;10 reps    Bridges with Clamshell --   blue loop added 10x 5 sec hold     Knee/Hip Exercises: Sidelying   Hip ABduction Strengthening;Left;2 sets;10 reps  knee bent, TC to keep hips stacked     Shoulder Exercises: Stretch   Other Shoulder Stretches Seated lat stretch 10x 8 sec hold  with blue roll   added Rt rotation to stretch LT 5x                   PT Short Term Goals - 07/23/19 1141      PT SHORT TERM GOAL #1   Title Pt will be independent and consistent with her initial HEP to increase LE strength and posture.    Time 6    Period Weeks    Status Achieved    Target Date 07/22/18      PT SHORT TERM GOAL #2   Title Pt will be able to complete sit to stand without significant weight shift to the Lt which will improve her efficiency with daily activity.    Time 6    Period Weeks    Status Achieved   Min weight shift            PT Long Term Goals - 07/23/19 1141      PT LONG TERM GOAL #1   Title Pt will be able to ambulate 271f without the need for AD and with adequate foot clearance, minimal trunk lean.    Time 12    Period Weeks    Status Partially Met   Not  consistently: fatfigue factore as well as what she has already done that day     PT LONG TERM GOAL #2   Title Pt will have 4+/5 MMT strength of the LEs for improved mechanics with daily activity.    Period Weeks    Status Partially Met      PT LONG TERM GOAL #3   Title Pt will have no more than 3 inch distance from C7 to the wall when standing with her back facing the wall, to reflect improvements in her posture and endurance.    Period Weeks    Status Partially Met      PT LONG TERM GOAL #4   Title Pt will be able to go 4 hours without her wearing her back brace during the day.    Time 12    Period Weeks    Status Partially Met   Has not worn in last five days, but pt not confident she wil stick with it. May go back to wearing for certain activities.     PT LONG TERM GOAL #5   Title Pt will perform 5 x sit to stand in less than 13 seconds    Period Weeks    Status Achieved   11 sec                Plan - 08/13/19 1214    Clinical Impression Statement Pt going to see orthotist today about her shoe lift. Lt thoacic was more sore today from continued sewing for longer tha nshe probably should. Pt had more difficulty tolerating standing exercises secondary to her Lt back muscle soreness, pt requested ice at end of session which she stated helped.    Personal Factors and Comorbidities Age;Fitness;Comorbidity 2;Past/Current Experience    Comorbidities Rt THA, T9-pelvis fusion    Examination-Activity Limitations Locomotion Level;Transfers    Stability/Clinical Decision Making Evolving/Moderate complexity    Rehab Potential Good    PT Frequency 2x / week    PT Duration 12 weeks    PT Treatment/Interventions ADLs/Self Care Home Management;Aquatic Therapy;Cryotherapy;Moist Heat;Electrical Stimulation;Therapeutic activities;Therapeutic  exercise;Balance training;Gait training;Neuromuscular re-education;Manual techniques;Dry needling;Taping;Patient/family education    PT Next Visit Plan  glute med isometric strength in different positions; f/u on pt completing entire HEP; trunk strength progressions    PT Home Exercise Plan [email or web address]    Consulted and Agree with Plan of Care Patient           Patient will benefit from skilled therapeutic intervention in order to improve the following deficits and impairments:  Abnormal gait, Improper body mechanics, Pain, Increased muscle spasms, Decreased strength, Decreased range of motion, Decreased activity tolerance, Hypomobility, Impaired flexibility, Difficulty walking, Decreased balance  Visit Diagnosis: Muscle weakness (generalized)  Abnormal posture  Difficulty in walking, not elsewhere classified  Pain in left leg     Problem List Patient Active Problem List   Diagnosis Date Noted  . Hip contracture, unspecified laterality 07/02/2018  . Overweight (BMI 25.0-29.9) 05/09/2017  . S/P right THA, AA 05/08/2017  . Chronic bilateral low back pain without sciatica 04/05/2015  . Disorder of sacroiliac joint 12/22/2014  . Chronic low back pain 12/22/2014  . Acquired scoliosis 11/27/2011  . Lumbosacral spondylosis without myelopathy 05/22/2011    Dianna Deshler, PTA 08/13/2019, 12:22 PM  [facility] [address], [place], [place], [address] Phone: [phone]   Fax:  [phone]  Name: Roya Gieselman MRN: [id number] Date of Birth: 04/19/1945

## 2019-08-18 ENCOUNTER — Encounter: Payer: Medicare Other | Admitting: Physical Therapy

## 2019-08-19 ENCOUNTER — Other Ambulatory Visit: Payer: Self-pay | Admitting: Family Medicine

## 2019-08-19 DIAGNOSIS — Z1231 Encounter for screening mammogram for malignant neoplasm of breast: Secondary | ICD-10-CM

## 2019-08-20 ENCOUNTER — Ambulatory Visit: Payer: Medicare Other | Admitting: Physical Therapy

## 2019-08-20 ENCOUNTER — Other Ambulatory Visit: Payer: Self-pay

## 2019-08-20 ENCOUNTER — Encounter: Payer: Self-pay | Admitting: Physical Therapy

## 2019-08-20 DIAGNOSIS — R293 Abnormal posture: Secondary | ICD-10-CM

## 2019-08-20 DIAGNOSIS — R262 Difficulty in walking, not elsewhere classified: Secondary | ICD-10-CM

## 2019-08-20 DIAGNOSIS — M79605 Pain in left leg: Secondary | ICD-10-CM

## 2019-08-20 DIAGNOSIS — M6281 Muscle weakness (generalized): Secondary | ICD-10-CM

## 2019-08-20 NOTE — Patient Instructions (Signed)
Access Code: [email or web address] URL: [email or web address] Date: 08/20/2019 Prepared by: [place]  Exercises Sit to Stand - 1 x daily - 7 x weekly - 10 reps - 3 sets Standing Anti-Rotation Press with Anchored Resistance - 1 x daily - 7 x weekly - 3 sets - 10 reps Doorway Pec Stretch at 60 Elevation - 1 x daily - 7 x weekly - 3 sets - 10 reps Supine Butterfly Groin Stretch - 1 x daily - 7 x weekly - 10 reps - 10 seconds hold Standing Row with Anchored Resistance - 1 x daily - 7 x weekly - 3 sets - 10 reps Standing Hip Abduction with Resistance at Ankles and Unilateral Counter Support - 1 x daily - 7 x weekly - 3 sets - 10 reps    [facility] [address] [place], [place] [address] Phone # [phone] Fax [phone]

## 2019-08-21 NOTE — Therapy (Signed)
[facility] [address], [place], [place], [address] Phone: [phone]   Fax:  [phone]  Physical Therapy Treatment  Patient Details  Name: [name] MRN: [id number] Date of Birth: May 22, 1945 Referring Provider (PT): Rennis Harding, MD   Encounter Date: 08/20/2019   PT End of Session - 08/20/19 1207    Visit Number 17    Date for PT Re-Evaluation 09/05/19    Authorization Type Medicare/BCBS    Authorization - Visit Number 17    Progress Note Due on Visit 20    PT Start Time 6962    PT Stop Time 1226    PT Time Calculation (min) 43 min    Activity Tolerance Patient tolerated treatment well;No increased pain    Behavior During Therapy [facility] for tasks assessed/performed           Past Medical History:  Diagnosis Date   Congenital musculoskeletal deformity of spine    Hypertension    Incontinence of bowel 09/2013   Lumbago    Lumbosacral spondylosis without myelopathy    Sleep disorder    uses gabapentin at bedtime    Spinal stenosis, lumbar region, without neurogenic claudication     Past Surgical History:  Procedure Laterality Date   ANKLE SURGERY Right 2015   lengthened the achilles    AUGMENTATION MAMMAPLASTY Bilateral 2000 or 2001 unsure    Left Implant has busted and is no longer visible   BACK SURGERY  2015   fusion , Dr Patrice Paradise at [facility]; has 2 metal rods in place , fusion from t2 to s1    BACK SURGERY  01/2017   Dr Rennis Harding ; repair of cracked titanium rod in back lumbar    bunionectomy  Bilateral 1980s   multiple    COLONOSCOPY     with polypectomy ; q 5 years    FOOT SURGERY  07/2011   SHOULDER SURGERY Left 2013   rotator cuff    TOTAL HIP ARTHROPLASTY Right 05/08/2017   Procedure: RIGHT TOTAL HIP ARTHROPLASTY ANTERIOR APPROACH;  Surgeon: Paralee Cancel, MD;  Location: [facility];  Service: Orthopedics;  Laterality: Right;  70 mins    There were no vitals filed  for this visit.   Subjective Assessment - 08/20/19 1154    Subjective Pt states that she is doing ok with her shoe lift after making those adjustments. No pain currently.    Pertinent History rods/fusion T9 to pelvis    Currently in Pain? No/denies                             [facility] Adult PT Treatment/Exercise - 08/21/19 0001      Knee/Hip Exercises: Aerobic   Nustep L5, x10 min PTA present to discuss progress and pain/soreness      Knee/Hip Exercises: Machines for Strengthening   Cybex Leg Press Seat 7: BLE #180; single leg 2x10 reps     Other Machine power tower pressdown with straight bar x12 reps       Knee/Hip Exercises: Standing   Other Standing Knee Exercises power tower #20 x15 reps BUE pressdown with bar    Other Standing Knee Exercises Lt and Rt hip slider with yellow TB side and diagonal x10 reps      Knee/Hip Exercises: Seated   Other Seated Knee/Hip Exercises neck retraction with ball on wall 2x10 reps     Other Seated Knee/Hip Exercises B  horizontal abduction red TB 2x10 reps                   PT Education - 08/21/19 0800    Education Details updates to HEP resistance bands    Person(s) Educated Patient    Methods Explanation    Comprehension Verbalized understanding            PT Short Term Goals - 07/23/19 1141      PT SHORT TERM GOAL #1   Title Pt will be independent and consistent with her initial HEP to increase LE strength and posture.    Time 6    Period Weeks    Status Achieved    Target Date 07/22/18      PT SHORT TERM GOAL #2   Title Pt will be able to complete sit to stand without significant weight shift to the Lt which will improve her efficiency with daily activity.    Time 6    Period Weeks    Status Achieved   Min weight shift            PT Long Term Goals - 08/20/19 1208      PT LONG TERM GOAL #1   Title Pt will be able to ambulate 243f without the need for AD and with adequate foot clearance,  minimal trunk lean.    Time 12    Period Weeks    Status Partially Met   Not consistently: fatfigue factore as well as what she has already done that day     PT LONG TERM GOAL #2   Title Pt will have 4+/5 MMT strength of the LEs for improved mechanics with daily activity.    Period Weeks    Status Partially Met      PT LONG TERM GOAL #3   Title Pt will have no more than 3 inch distance from C7 to the wall when standing with her back facing the wall, to reflect improvements in her posture and endurance.    Period Weeks    Status Partially Met      PT LONG TERM GOAL #4   Title Pt will be able to go 4 hours without her wearing her back brace during the day.    Time 12    Period Weeks    Status Partially Met   Has not worn in last five days, but pt not confident she wil stick with it. May go back to wearing for certain activities.     PT LONG TERM GOAL #5   Title Pt will perform 5 x sit to stand in less than 13 seconds    Period Weeks    Status Achieved   11 sec                Plan - 08/21/19 0801    Clinical Impression Statement Today's session continued with therex to increase LE/trunk strength. Pt demonstrates significant improvements in her technique and focus on proper tempo with her hip abduction slide today. PT made some updates to her resistance bands for home. Session also included cervical and other posture exercises. Pt denied increase in pain at the end of todays session. PT encouraged her to use her back brace periodically throughout the day if needed to decrease pain. If she would like to not wear it eventually, she should try to ease out of it.    Personal Factors and Comorbidities Age;Fitness;Comorbidity 2;Past/Current Experience    Comorbidities Rt THA, T9-pelvis  fusion    Examination-Activity Limitations Locomotion Level;Transfers    Stability/Clinical Decision Making Evolving/Moderate complexity    Rehab Potential Good    PT Frequency 2x / week    PT  Duration 12 weeks    PT Treatment/Interventions ADLs/Self Care Home Management;Aquatic Therapy;Cryotherapy;Moist Heat;Electrical Stimulation;Therapeutic activities;Therapeutic exercise;Balance training;Gait training;Neuromuscular re-education;Manual techniques;Dry needling;Taping;Patient/family education    PT Next Visit Plan glute med isometric strength in different positions; f/u on pt completing entire HEP; trunk strength progressions    PT Home Exercise Plan [email or web address]    Consulted and Agree with Plan of Care Patient           Patient will benefit from skilled therapeutic intervention in order to improve the following deficits and impairments:  Abnormal gait, Improper body mechanics, Pain, Increased muscle spasms, Decreased strength, Decreased range of motion, Decreased activity tolerance, Hypomobility, Impaired flexibility, Difficulty walking, Decreased balance  Visit Diagnosis: Muscle weakness (generalized)  Abnormal posture  Difficulty in walking, not elsewhere classified  Pain in left leg     Problem List Patient Active Problem List   Diagnosis Date Noted   Hip contracture, unspecified laterality 07/02/2018   Overweight (BMI 25.0-29.9) 05/09/2017   S/P right THA, AA 05/08/2017   Chronic bilateral low back pain without sciatica 04/05/2015   Disorder of sacroiliac joint 12/22/2014   Chronic low back pain 12/22/2014   Acquired scoliosis 11/27/2011   Lumbosacral spondylosis without myelopathy 05/22/2011    8:58 AM,08/21/19 Sherol Dade PT, DPT [facility] [address] [place], [place], [address] Phone: [phone]   Fax:  [phone]  Name: [name] MRN: [id number] Date of Birth: 28-Nov-1945

## 2019-08-22 ENCOUNTER — Encounter: Payer: Self-pay | Admitting: Physical Therapy

## 2019-08-22 ENCOUNTER — Other Ambulatory Visit: Payer: Self-pay

## 2019-08-22 ENCOUNTER — Ambulatory Visit: Payer: Medicare Other | Admitting: Physical Therapy

## 2019-08-22 DIAGNOSIS — M6281 Muscle weakness (generalized): Secondary | ICD-10-CM

## 2019-08-22 DIAGNOSIS — R293 Abnormal posture: Secondary | ICD-10-CM

## 2019-08-22 DIAGNOSIS — R262 Difficulty in walking, not elsewhere classified: Secondary | ICD-10-CM

## 2019-08-22 NOTE — Therapy (Signed)
[facility] [address] [place], [place], [address] Phone: [phone]   Fax:  [phone]  Physical Therapy Treatment  Patient Details  Name: [name] MRN: [id number] Date of Birth: Jan 04, 1946 Referring Provider (PT): Rennis Harding, MD   Encounter Date: 08/22/2019   PT End of Session - 08/22/19 1653    Visit Number 18    Date for PT Re-Evaluation 09/05/19    Authorization Type Medicare/BCBS    Authorization Time Period 06/05/19 to 09/05/19    Authorization - Visit Number 18    Progress Note Due on Visit 20    PT Start Time 3832    PT Stop Time 1515    PT Time Calculation (min) 50 min    Activity Tolerance Patient tolerated treatment well    Behavior During Therapy [facility] for tasks assessed/performed           Past Medical History:  Diagnosis Date  . Congenital musculoskeletal deformity of spine   . Hypertension   . Incontinence of bowel 09/2013  . Lumbago   . Lumbosacral spondylosis without myelopathy   . Sleep disorder    uses gabapentin at bedtime   . Spinal stenosis, lumbar region, without neurogenic claudication     Past Surgical History:  Procedure Laterality Date  . ANKLE SURGERY Right 2015   lengthened the achilles   . AUGMENTATION MAMMAPLASTY Bilateral 2000 or 2001 unsure    Left Implant has busted and is no longer visible  . BACK SURGERY  2015   fusion , Dr Patrice Paradise at [facility]; has 2 metal rods in place , fusion from t2 to s1   . BACK SURGERY  01/2017   Dr Rennis Harding ; repair of cracked titanium rod in back lumbar   . bunionectomy  Bilateral 1980s   multiple   . COLONOSCOPY     with polypectomy ; q 5 years   . FOOT SURGERY  07/2011  . SHOULDER SURGERY Left 2013   rotator cuff   . TOTAL HIP ARTHROPLASTY Right 05/08/2017   Procedure: RIGHT TOTAL HIP ARTHROPLASTY ANTERIOR APPROACH;  Surgeon: Paralee Cancel, MD;  Location: [facility];  Service: Orthopedics;  Laterality: Right;  70 mins      There were no vitals filed for this visit.   Subjective Assessment - 08/22/19 1652    Subjective Back is a little sore today.    Pertinent History rods/fusion T9 to pelvis    Currently in Pain? Yes    Pain Score 6     Pain Location Back    Pain Orientation Mid;Right    Pain Descriptors / Indicators Sore    Aggravating Factors  constant, overdoing    Pain Relieving Factors rest    Multiple Pain Sites No           Patient seen for aquatic therapy today.  Treatment took place in water 2.5-4 feet deep depending upon activity.  Pt entered the pool via steps with heavy use of rails. Water temp 87.6 degrees  Seated water bench with 75% submersion Pt performed seated LE AROM exercises 20x in all planes, Standing in mid chest/waist depth water pt performed water walking in all  4 directions 4  lengths of pool. VC for speed in order to generate appropriate current for resistance and posture. Hip 3 ways 20x ea with min support of pool wall. VC to push/pull against water with a force appropriate. Water bicycle x 6 min with blue noodle. Bil  heel raises 20x no UE support. White noodle knee extensions 10x rtLE, UE support required for balance.High knee marching with blue noodle 2 full lengths of pool.   Frequent mid back stretching in between exercises holding onto the side of the pool, Lt rotation to stretch RT side.Ended with decompression float x 2 min.                              PT Short Term Goals - 07/23/19 1141      PT SHORT TERM GOAL #1   Title Pt will be independent and consistent with her initial HEP to increase LE strength and posture.    Time 6    Period Weeks    Status Achieved    Target Date 07/22/18      PT SHORT TERM GOAL #2   Title Pt will be able to complete sit to stand without significant weight shift to the Lt which will improve her efficiency with daily activity.    Time 6    Period Weeks    Status Achieved   Min weight shift             PT Long Term Goals - 08/20/19 1208      PT LONG TERM GOAL #1   Title Pt will be able to ambulate 239f without the need for AD and with adequate foot clearance, minimal trunk lean.    Time 12    Period Weeks    Status Partially Met   Not consistently: fatfigue factore as well as what she has already done that day     PT LONG TERM GOAL #2   Title Pt will have 4+/5 MMT strength of the LEs for improved mechanics with daily activity.    Period Weeks    Status Partially Met      PT LONG TERM GOAL #3   Title Pt will have no more than 3 inch distance from C7 to the wall when standing with her back facing the wall, to reflect improvements in her posture and endurance.    Period Weeks    Status Partially Met      PT LONG TERM GOAL #4   Title Pt will be able to go 4 hours without her wearing her back brace during the day.    Time 12    Period Weeks    Status Partially Met   Has not worn in last five days, but pt not confident she wil stick with it. May go back to wearing for certain activities.     PT LONG TERM GOAL #5   Title Pt will perform 5 x sit to stand in less than 13 seconds    Period Weeks    Status Achieved   11 sec                Plan - 08/22/19 1655    Clinical Impression Statement Pt arrives for aquatic therapy today with Rt midback pain. This was allieviated with gentle stretching in the pool. Pt is bale to stand straighter and longer when she is at least 50% submersed in the water, Pt did need to take frequent stretch breaks as pt was attempting to increase her endurance in the pool. She tried 3 laps of water walking vs 2.    Personal Factors and Comorbidities Age;Fitness;Comorbidity 2;Past/Current Experience    Comorbidities Rt THA, T9-pelvis fusion    Examination-Activity Limitations Locomotion Level;Transfers  Stability/Clinical Decision Making Evolving/Moderate complexity    Rehab Potential Good    PT Frequency 2x / week    PT Duration 12 weeks    PT  Treatment/Interventions ADLs/Self Care Home Management;Aquatic Therapy;Cryotherapy;Moist Heat;Electrical Stimulation;Therapeutic activities;Therapeutic exercise;Balance training;Gait training;Neuromuscular re-education;Manual techniques;Dry needling;Taping;Patient/family education    PT Next Visit Plan glute med isometric strength in different positions; f/u on pt completing entire HEP; trunk strength progressions    PT Home Exercise Plan [email or web address]    Consulted and Agree with Plan of Care Patient           Patient will benefit from skilled therapeutic intervention in order to improve the following deficits and impairments:  Abnormal gait, Improper body mechanics, Pain, Increased muscle spasms, Decreased strength, Decreased range of motion, Decreased activity tolerance, Hypomobility, Impaired flexibility, Difficulty walking, Decreased balance  Visit Diagnosis: Muscle weakness (generalized)  Difficulty in walking, not elsewhere classified  Abnormal posture     Problem List Patient Active Problem List   Diagnosis Date Noted  . Hip contracture, unspecified laterality 07/02/2018  . Overweight (BMI 25.0-29.9) 05/09/2017  . S/P right THA, AA 05/08/2017  . Chronic bilateral low back pain without sciatica 04/05/2015  . Disorder of sacroiliac joint 12/22/2014  . Chronic low back pain 12/22/2014  . Acquired scoliosis 11/27/2011  . Lumbosacral spondylosis without myelopathy 05/22/2011    Dollie Bressi, PTA 08/22/2019, 4:58 PM  [facility] [address], [place], [place], [address] Phone: [phone]   Fax:  [phone]  Name: [name] MRN: [id number] Date of Birth: July 10, 1945

## 2019-08-27 ENCOUNTER — Ambulatory Visit: Payer: Medicare Other | Admitting: Physical Therapy

## 2019-08-27 ENCOUNTER — Encounter: Payer: Self-pay | Admitting: Physical Therapy

## 2019-08-27 ENCOUNTER — Other Ambulatory Visit: Payer: Self-pay

## 2019-08-27 DIAGNOSIS — R262 Difficulty in walking, not elsewhere classified: Secondary | ICD-10-CM

## 2019-08-27 DIAGNOSIS — R293 Abnormal posture: Secondary | ICD-10-CM

## 2019-08-27 DIAGNOSIS — M6281 Muscle weakness (generalized): Secondary | ICD-10-CM | POA: Diagnosis not present

## 2019-08-27 DIAGNOSIS — M79605 Pain in left leg: Secondary | ICD-10-CM

## 2019-08-27 NOTE — Therapy (Signed)
[facility] [address], [place], [place], [address] Phone: [phone]   Fax:  [phone]  Physical Therapy Treatment  Patient Details  Name: [name] MRN: [id number] Date of Birth: [birth year] Referring Provider (PT): Rennis Harding, MD  Progress Note Reporting Period 07/25/19 to 08/27/19  See note below for Objective Data and Assessment of Progress/Goals.      Encounter Date: 08/27/2019   PT End of Session - 08/27/19 1126    Visit Number 19    Date for PT Re-Evaluation 09/05/19    Authorization Type Medicare/BCBS    Authorization Time Period 06/05/19 to 09/05/19    Authorization - Visit Number 19    Progress Note Due on Visit 29    PT Start Time 1058    PT Stop Time 1138    PT Time Calculation (min) 40 min    Activity Tolerance Patient tolerated treatment well;No increased pain    Behavior During Therapy WFL for tasks assessed/performed           Past Medical History:  Diagnosis Date  . Congenital musculoskeletal deformity of spine   . Hypertension   . Incontinence of bowel 09/2013  . Lumbago   . Lumbosacral spondylosis without myelopathy   . Sleep disorder    uses gabapentin at bedtime   . Spinal stenosis, lumbar region, without neurogenic claudication     Past Surgical History:  Procedure Laterality Date  . ANKLE SURGERY Right 2015   lengthened the achilles   . AUGMENTATION MAMMAPLASTY Bilateral 2000 or 2001 unsure    Left Implant has busted and is no longer visible  . BACK SURGERY  2015   fusion , Dr Patrice Paradise at [facility]; has 2 metal rods in place , fusion from t2 to s1   . BACK SURGERY  01/2017   Dr Rennis Harding ; repair of cracked titanium rod in back lumbar   . bunionectomy  Bilateral 1980s   multiple   . COLONOSCOPY     with polypectomy ; q 5 years   . FOOT SURGERY  07/2011  . SHOULDER SURGERY Left 2013   rotator cuff   . TOTAL HIP ARTHROPLASTY Right 05/08/2017   Procedure:  RIGHT TOTAL HIP ARTHROPLASTY ANTERIOR APPROACH;  Surgeon: Paralee Cancel, MD;  Location: [facility];  Service: Orthopedics;  Laterality: Right;  70 mins    There were no vitals filed for this visit.   Subjective Assessment - 08/27/19 1340    Subjective Pt states she was doing good last week.    Pertinent History rods/fusion T9 to pelvis    Currently in Pain? No/denies                             [facility] Adult PT Treatment/Exercise - 08/27/19 0001      Knee/Hip Exercises: Machines for Strengthening   Cybex Leg Press Seat 7: BLE 2x10 reps; single leg #110 2x10 reps each       Knee/Hip Exercises: Standing   Other Standing Knee Exercises Lt and Rt LE slider with yellow TB around ankles 2x10 reps     Other Standing Knee Exercises BUE pressdown with green TB x10 reps, blue 2x10       Moist Heat Therapy   Number Minutes Moist Heat 10 Minutes    Moist Heat Location Lumbar Spine      Manual Therapy   Manual therapy comments passive Lt hip abduction  Joint Mobilization Lt hip inferior/medial mobilization grade IV, pt on side    Soft tissue mobilization STM Lt thoracic paraspinals                  PT Education - 08/27/19 1340    Education Details technique with therex    Person(s) Educated Patient    Methods Explanation;Verbal cues    Comprehension Verbalized understanding;Returned demonstration            PT Short Term Goals - 08/27/19 1142      PT SHORT TERM GOAL #1   Title Pt will be independent and consistent with her initial HEP to increase LE strength and posture.    Time 6    Period Weeks    Status Achieved    Target Date 07/22/18      PT SHORT TERM GOAL #2   Title Pt will be able to complete sit to stand without significant weight shift to the Lt which will improve her efficiency with daily activity.    Time 6    Period Weeks    Status Achieved   Min weight shift            PT Long Term Goals - 08/27/19 1142      PT LONG TERM GOAL #1     Title Pt will be able to ambulate 215f without the need for AD and with adequate foot clearance, minimal trunk lean.    Baseline no AD but she fatigues with significant hip drop    Time 12    Period Weeks    Status Partially Met   Not consistently: fatfigue factore as well as what she has already done that day     PT LONG TERM GOAL #2   Title Pt will have 4+/5 MMT strength of the LEs for improved mechanics with daily activity.    Period Weeks    Status Partially Met      PT LONG TERM GOAL #3   Title Pt will have no more than 3 inch distance from C7 to the wall when standing with her back facing the wall, to reflect improvements in her posture and endurance.    Period Weeks    Status Partially Met      PT LONG TERM GOAL #4   Title Pt will be able to go 4 hours without her wearing her back brace during the day.    Baseline not consistent with this    Time 12    Period Weeks    Status Partially Met   Has not worn in last five days, but pt not confident she wil stick with it. May go back to wearing for certain activities.     PT LONG TERM GOAL #5   Title Pt will perform 5 x sit to stand in less than 13 seconds    Period Weeks    Status Achieved   11 sec                Plan - 08/27/19 1141    Clinical Impression Statement Pt is doing better with her walking since changing the shoe lift. She is limited with some therex secondary to Lt hip ROM limitations. PT completed Lt hip mobilization and passive stretching to address this. Pt had improved sidelying hip abduction with PT resistance through her partial range. She would continue to benefit from skilled PT to address her limitations in hip flexibility, Lt hip abductor strength and improve her overall  activity participation moving forward.    Personal Factors and Comorbidities Age;Fitness;Comorbidity 2;Past/Current Experience    Comorbidities Rt THA, T9-pelvis fusion    Examination-Activity Limitations Locomotion Level;Transfers     Stability/Clinical Decision Making Evolving/Moderate complexity    Rehab Potential Good    PT Frequency 2x / week    PT Duration 12 weeks    PT Treatment/Interventions ADLs/Self Care Home Management;Aquatic Therapy;Cryotherapy;Moist Heat;Electrical Stimulation;Therapeutic activities;Therapeutic exercise;Balance training;Gait training;Neuromuscular re-education;Manual techniques;Dry needling;Taping;Patient/family education    PT Next Visit Plan glute med isometric strength in different positions; hip abductor stretch; trunk strength progressions    PT Home Exercise Plan [email or web address]    Consulted and Agree with Plan of Care Patient           Patient will benefit from skilled therapeutic intervention in order to improve the following deficits and impairments:  Abnormal gait, Improper body mechanics, Pain, Increased muscle spasms, Decreased strength, Decreased range of motion, Decreased activity tolerance, Hypomobility, Impaired flexibility, Difficulty walking, Decreased balance  Visit Diagnosis: Muscle weakness (generalized)  Difficulty in walking, not elsewhere classified  Abnormal posture  Pain in left leg     Problem List Patient Active Problem List   Diagnosis Date Noted  . Hip contracture, unspecified laterality 07/02/2018  . Overweight (BMI 25.0-29.9) 05/09/2017  . S/P right THA, AA 05/08/2017  . Chronic bilateral low back pain without sciatica 04/05/2015  . Disorder of sacroiliac joint 12/22/2014  . Chronic low back pain 12/22/2014  . Acquired scoliosis 11/27/2011  . Lumbosacral spondylosis without myelopathy 05/22/2011    1:45 PM,08/27/19 Sherol Dade PT, DPT [facility] [address], [place], [place], [address] Phone: [phone]   Fax:  [phone]  Name: [name] MRN: [id number] Date of Birth: May 05, 1945

## 2019-08-29 ENCOUNTER — Encounter: Payer: Self-pay | Admitting: Physical Therapy

## 2019-08-29 ENCOUNTER — Other Ambulatory Visit: Payer: Self-pay

## 2019-08-29 ENCOUNTER — Ambulatory Visit: Payer: Medicare Other | Admitting: Physical Therapy

## 2019-08-29 DIAGNOSIS — M6281 Muscle weakness (generalized): Secondary | ICD-10-CM

## 2019-08-29 DIAGNOSIS — R262 Difficulty in walking, not elsewhere classified: Secondary | ICD-10-CM

## 2019-08-29 DIAGNOSIS — M79605 Pain in left leg: Secondary | ICD-10-CM

## 2019-08-29 DIAGNOSIS — R293 Abnormal posture: Secondary | ICD-10-CM

## 2019-08-29 NOTE — Therapy (Signed)
[facility] [address] [place], [place], [address] Phone: [phone]   Fax:  [phone]  Physical Therapy Treatment  Patient Details  Name: [name] MRN: [id number] Date of Birth: [birth year] Referring Provider (PT): Rennis Harding, MD   Encounter Date: 08/29/2019   PT End of Session - 08/29/19 1509    Visit Number 20    Date for PT Re-Evaluation 09/05/19    Authorization Type Medicare/BCBS    Authorization Time Period 06/05/19 to 09/05/19    Authorization - Visit Number 20    Progress Note Due on Visit 29    PT Start Time 1210    PT Stop Time 1255    PT Time Calculation (min) 45 min    Activity Tolerance Patient tolerated treatment well    Behavior During Therapy [facility] for tasks assessed/performed           Past Medical History:  Diagnosis Date  . Congenital musculoskeletal deformity of spine   . Hypertension   . Incontinence of bowel 09/2013  . Lumbago   . Lumbosacral spondylosis without myelopathy   . Sleep disorder    uses gabapentin at bedtime   . Spinal stenosis, lumbar region, without neurogenic claudication     Past Surgical History:  Procedure Laterality Date  . ANKLE SURGERY Right 2015   lengthened the achilles   . AUGMENTATION MAMMAPLASTY Bilateral 2000 or 2001 unsure    Left Implant has busted and is no longer visible  . BACK SURGERY  2015   fusion , Dr Patrice Paradise at [facility]; has 2 metal rods in place , fusion from t2 to s1   . BACK SURGERY  01/2017   Dr Rennis Harding ; repair of cracked titanium rod in back lumbar   . bunionectomy  Bilateral 1980s   multiple   . COLONOSCOPY     with polypectomy ; q 5 years   . FOOT SURGERY  07/2011  . SHOULDER SURGERY Left 2013   rotator cuff   . TOTAL HIP ARTHROPLASTY Right 05/08/2017   Procedure: RIGHT TOTAL HIP ARTHROPLASTY ANTERIOR APPROACH;  Surgeon: Paralee Cancel, MD;  Location: [facility];  Service: Orthopedics;  Laterality: Right;  70 mins      There were no vitals filed for this visit.   Subjective Assessment - 08/29/19 1508    Subjective I took a muscle relaxer and it really helped my back.    Pertinent History rods/fusion T9 to pelvis    Currently in Pain? No/denies           Patient seen for aquatic therapy today.  Treatment took place in water 2.5-4 feet deep depending upon activity.  Pt entered the pool via 3 stairs with heavy use of the rails. Water temp 87.6 degrees F  Seated water bench with 75% submersion Pt performed seated LE AROM exercises 20x in all planes,  Standing in mid chest/waist depth water pt performed water walking in all 4 directions 4 lengths of pool. VC for speed in order to generate appropriate current for resistance. Pt required short decompression float break to inhibit Lt mid thoracic muscle spasm. Water bicycle x 4 min with blue noodle Mid thoracic stretch at pool edge. White noodle Rt hip flexion/knee ext 2x10, required UE support Black UE water weights breast stroke arms 2x 1 min End session x 2 min decompression float  PT Short Term Goals - 08/27/19 1142      PT SHORT TERM GOAL #1   Title Pt will be independent and consistent with her initial HEP to increase LE strength and posture.    Time 6    Period Weeks    Status Achieved    Target Date 07/22/18      PT SHORT TERM GOAL #2   Title Pt will be able to complete sit to stand without significant weight shift to the Lt which will improve her efficiency with daily activity.    Time 6    Period Weeks    Status Achieved   Min weight shift            PT Long Term Goals - 08/27/19 1142      PT LONG TERM GOAL #1   Title Pt will be able to ambulate 268f without the need for AD and with adequate foot clearance, minimal trunk lean.    Baseline no AD but she fatigues with significant hip drop    Time 12    Period Weeks    Status Partially Met   Not consistently: fatfigue  factore as well as what she has already done that day     PT LONG TERM GOAL #2   Title Pt will have 4+/5 MMT strength of the LEs for improved mechanics with daily activity.    Period Weeks    Status Partially Met      PT LONG TERM GOAL #3   Title Pt will have no more than 3 inch distance from C7 to the wall when standing with her back facing the wall, to reflect improvements in her posture and endurance.    Period Weeks    Status Partially Met      PT LONG TERM GOAL #4   Title Pt will be able to go 4 hours without her wearing her back brace during the day.    Baseline not consistent with this    Time 12    Period Weeks    Status Partially Met   Has not worn in last five days, but pt not confident she wil stick with it. May go back to wearing for certain activities.     PT LONG TERM GOAL #5   Title Pt will perform 5 x sit to stand in less than 13 seconds    Period Weeks    Status Achieved   11 sec                Plan - 08/29/19 1510    Clinical Impression Statement Pt continues to report greater ease walking in water, especially in staying "more straight." I want to work on my endurance today so I can walk longer.    Personal Factors and Comorbidities Age;Fitness;Comorbidity 2;Past/Current Experience    Examination-Activity Limitations Locomotion Level;Transfers    Stability/Clinical Decision Making Evolving/Moderate complexity    Rehab Potential Good    PT Frequency 2x / week    PT Duration 12 weeks    PT Treatment/Interventions ADLs/Self Care Home Management;Aquatic Therapy;Cryotherapy;Moist Heat;Electrical Stimulation;Therapeutic activities;Therapeutic exercise;Balance training;Gait training;Neuromuscular re-education;Manual techniques;Dry needling;Taping;Patient/family education    PT Next Visit Plan glute med isometric strength in different positions; hip abductor stretch; trunk strength progressions    PT Home Exercise Plan [email or web address]    Consulted and Agree with Plan of  Care Patient           Patient will benefit from skilled therapeutic intervention in order to  improve the following deficits and impairments:  Abnormal gait, Improper body mechanics, Pain, Increased muscle spasms, Decreased strength, Decreased range of motion, Decreased activity tolerance, Hypomobility, Impaired flexibility, Difficulty walking, Decreased balance  Visit Diagnosis: Muscle weakness (generalized)  Difficulty in walking, not elsewhere classified  Abnormal posture  Pain in left leg     Problem List Patient Active Problem List   Diagnosis Date Noted  . Hip contracture, unspecified laterality 07/02/2018  . Overweight (BMI 25.0-29.9) 05/09/2017  . S/P right THA, AA 05/08/2017  . Chronic bilateral low back pain without sciatica 04/05/2015  . Disorder of sacroiliac joint 12/22/2014  . Chronic low back pain 12/22/2014  . Acquired scoliosis 11/27/2011  . Lumbosacral spondylosis without myelopathy 05/22/2011    [name] PTA  08/29/19 3:20 PM 08/29/2019, 3:13 PM  [facility] [address], [place], [place], [address] Phone: [phone]   Fax:  [phone]  Name: [name] MRN: [id number] Date of Birth: Sep 13, 1945

## 2019-09-03 ENCOUNTER — Ambulatory Visit: Payer: Medicare Other | Admitting: Physical Therapy

## 2019-09-03 ENCOUNTER — Other Ambulatory Visit: Payer: Self-pay

## 2019-09-03 ENCOUNTER — Encounter: Payer: Self-pay | Admitting: Physical Therapy

## 2019-09-03 DIAGNOSIS — R293 Abnormal posture: Secondary | ICD-10-CM

## 2019-09-03 DIAGNOSIS — M79605 Pain in left leg: Secondary | ICD-10-CM

## 2019-09-03 DIAGNOSIS — M6281 Muscle weakness (generalized): Secondary | ICD-10-CM

## 2019-09-03 DIAGNOSIS — R262 Difficulty in walking, not elsewhere classified: Secondary | ICD-10-CM

## 2019-09-04 NOTE — Patient Instructions (Signed)
Access Code: [email or web address] URL: [email or web address] Date: 09/04/2019 Prepared by: [facility]  Exercises .Sit to Stand - 1 x daily - 7 x weekly - 10 reps - 3 sets .Standing Anti-Rotation Press with Anchored Resistance - 1 x daily - 7 x weekly - 3 sets - 10 reps .Doorway Pec Stretch at 60 Elevation - 1 x daily - 7 x weekly - 3 sets - 10 reps .Supine Butterfly Groin Stretch - 1 x daily - 7 x weekly - 10 reps - 10 seconds hold .Standing Row with Anchored Resistance - 1 x daily - 7 x weekly - 3 sets - 10 reps .Standing Hip Abduction with Resistance at Ankles and Unilateral Counter Support - 1 x daily - 7 x weekly - 3 sets - 5 reps   [facility] [address], [place],  [address] Phone # [phone] Fax [phone]

## 2019-09-04 NOTE — Therapy (Signed)
[facility] [address], [place], [place], [address] Phone: [phone]   Fax:  [phone]  Physical Therapy Treatment/re-eval  Patient Details  Name: [name] MRN: [id number] Date of Birth: 05-21-1945 Referring Provider (PT): Rennis Harding, MD   Encounter Date: 09/03/2019   PT End of Session - 09/04/19 0823    Visit Number 21    Date for PT Re-Evaluation 11/06/19    Authorization Type Medicare/BCBS    Authorization Time Period 09/06/19 to 11/06/19    Authorization - Visit Number 21    Progress Note Due on Visit 31    PT Start Time 1102    PT Stop Time 1145    PT Time Calculation (min) 43 min    Activity Tolerance Patient tolerated treatment well    Behavior During Therapy [facility] for tasks assessed/performed           Past Medical History:  Diagnosis Date   Congenital musculoskeletal deformity of spine    Hypertension    Incontinence of bowel 09/2013   Lumbago    Lumbosacral spondylosis without myelopathy    Sleep disorder    uses gabapentin at bedtime    Spinal stenosis, lumbar region, without neurogenic claudication     Past Surgical History:  Procedure Laterality Date   ANKLE SURGERY Right 2015   lengthened the achilles    AUGMENTATION MAMMAPLASTY Bilateral 2000 or 2001 unsure    Left Implant has busted and is no longer visible   BACK SURGERY  2015   fusion , Dr Patrice Paradise at [facility]; has 2 metal rods in place , fusion from t2 to s1    BACK SURGERY  01/2017   Dr Rennis Harding ; repair of cracked titanium rod in back lumbar    bunionectomy  Bilateral 1980s   multiple    COLONOSCOPY     with polypectomy ; q 5 years    FOOT SURGERY  07/2011   SHOULDER SURGERY Left 2013   rotator cuff    TOTAL HIP ARTHROPLASTY Right 05/08/2017   Procedure: RIGHT TOTAL HIP ARTHROPLASTY ANTERIOR APPROACH;  Surgeon: Paralee Cancel, MD;  Location: [facility];  Service: Orthopedics;  Laterality: Right;   70 mins    There were no vitals filed for this visit.   Subjective Assessment - 09/03/19 1103    Subjective Pt states that things are going well. She is feeling improvements with her walking and activity participation. She is noticing with her walking that her knee tends to drop in.    Pertinent History rods/fusion T9 to pelvis    Currently in Pain? No/denies              [facility] PT Assessment - 09/04/19 0001      Assessment   Medical Diagnosis fusion of lumbar spine    Referring Provider (PT) Rennis Harding, MD    Next MD Visit 6 weeks       Precautions   Precautions None      Restrictions   Weight Bearing Restrictions No      Home Environment   Living Environment Private residence      Cognition   Overall Cognitive Status Within Functional Limits for tasks assessed      Strength   Overall Strength Comments Lt hip abd 3+/5, Lt hip extension 3+/5      Flexibility   Soft Tissue Assessment /Muscle Length yes    Hamstrings WNL    Quadriceps Ely's test: 95 deg  bilaterally       High Level Balance   High Level Balance Comments SLS: Rt 5, Lt 3 sec (+) trendelenburg                          [facility] Adult PT Treatment/Exercise - 09/04/19 0001      Ambulation/Gait   Gait Comments x233f: ambulating with SPC, decreased Lt foot clearance as distance increased, (+) Lt hip trendelenburg      Knee/Hip Exercises: Standing   Hip Abduction Stengthening;Right;Left;3 sets;5 reps    Abduction Limitations yellow TB around feet       Manual Therapy   Soft tissue mobilization using addady: STM Lt glutes- pt in sidelying                   PT Education - 09/04/19 0823    Education Details reviewed HEP; discussed importance of completing atleast 3 days a week    Person(s) Educated Patient    Methods Explanation;Handout    Comprehension Verbalized understanding;Returned demonstration            PT Short Term Goals - 09/03/19 1114      PT SHORT TERM GOAL #1     Title Pt will be independent and consistent with her initial HEP to increase LE strength and posture.    Time 6    Period Weeks    Status Achieved    Target Date 07/22/18      PT SHORT TERM GOAL #2   Title Pt will be able to complete sit to stand without significant weight shift to the Lt which will improve her efficiency with daily activity.    Time 6    Period Weeks    Status Achieved   Min weight shift            PT Long Term Goals - 09/03/19 1120      PT LONG TERM GOAL #1   Title Pt will be able to ambulate 2037fwithout the need for AD and with adequate foot clearance, minimal trunk lean.    Baseline met except for foot clearance worsened 15075f   Status Partially Met      PT LONG TERM GOAL #2   Title Pt will have 4+/5 MMT strength of the LEs for improved mechanics with daily activity.    Period Weeks    Status Partially Met      PT LONG TERM GOAL #3   Title Pt will have no more than 3 inch distance from C7 to the wall when standing with her back facing the wall, to reflect improvements in her posture and endurance.    Baseline 3.5 inches    Time 12    Status Partially Met      PT LONG TERM GOAL #4   Title Pt will be able to go 4 hours without her wearing her back brace during the day.    Baseline not wearing this in the past 2 weeks.    Time 12    Period Weeks    Status Achieved      PT LONG TERM GOAL #5   Title Pt will perform 5 x sit to stand in less than 13 seconds    Baseline 11 sec    Period Weeks    Status Achieved                 Plan - 09/04/19 0826    Clinical  Impression Statement Pt is making steady progress towards her goals. She has met all of her short term goals and 1 of her long term goals. She is no longer wearing her brace consistently throughout the day. She is standing with improved posture and ambulating with improved foot clearance. Pts pain is more intermittent during the day, and she finds that her pain is less when  completing her morning stretches. Pts gluteal strength has improved to 3+/5 MMT although she still has noted Trendelenburg on the Lt and limited passive/active hip abduction on this side. This weakness is evident with ambulating greater than 174f and requires her to put most of her weight through her AD. Pt is consistently working on portions of her HEP, so PT educated her on the benefits of completing the HEP in its entirety. She would continue to benefit from skilled PT to address her remaining limitations in LE strength, flexibility and endurance with daily activity.    Personal Factors and Comorbidities Age;Fitness;Comorbidity 2;Past/Current Experience    Examination-Activity Limitations Locomotion Level;Transfers    Stability/Clinical Decision Making Evolving/Moderate complexity    Rehab Potential Good    PT Frequency 2x / week    PT Duration 8 weeks    PT Treatment/Interventions ADLs/Self Care Home Management;Aquatic Therapy;Cryotherapy;Moist Heat;Electrical Stimulation;Therapeutic activities;Therapeutic exercise;Balance training;Gait training;Neuromuscular re-education;Manual techniques;Dry needling;Taping;Patient/family education;Joint Manipulations    PT Next Visit Plan glute med isometric strength in different positions; hip abductor stretch; trunk strength progressions    PT Home Exercise Plan [id number]    Consulted and Agree with Plan of Care Patient           Patient will benefit from skilled therapeutic intervention in order to improve the following deficits and impairments:  Abnormal gait, Improper body mechanics, Pain, Increased muscle spasms, Decreased strength, Decreased range of motion, Decreased activity tolerance, Hypomobility, Impaired flexibility, Difficulty walking, Decreased balance, Postural dysfunction  Visit Diagnosis: Muscle weakness (generalized) - Plan: PT plan of care cert/re-cert  Difficulty in walking, not elsewhere classified - Plan: PT plan of care  cert/re-cert  Abnormal posture - Plan: PT plan of care cert/re-cert  Pain in left leg - Plan: PT plan of care cert/re-cert     Problem List Patient Active Problem List   Diagnosis Date Noted   Hip contracture, unspecified laterality 07/02/2018   Overweight (BMI 25.0-29.9) 05/09/2017   S/P right THA, AA 05/08/2017   Chronic bilateral low back pain without sciatica 04/05/2015   Disorder of sacroiliac joint 12/22/2014   Chronic low back pain 12/22/2014   Acquired scoliosis 11/27/2011   Lumbosacral spondylosis without myelopathy 05/22/2011    8:44 AM,09/04/19 SSherol DadePT, DPT [facility] [place] [address]Phone: [phone]  Fax:  [phone] Name: Suzanne BasdenMRN: [id number] of Birth: [birth year]

## 2019-09-05 ENCOUNTER — Other Ambulatory Visit: Payer: Self-pay

## 2019-09-05 ENCOUNTER — Encounter: Payer: Self-pay | Admitting: Physical Therapy

## 2019-09-05 ENCOUNTER — Ambulatory Visit: Payer: Medicare Other | Admitting: Physical Therapy

## 2019-09-05 DIAGNOSIS — M6281 Muscle weakness (generalized): Secondary | ICD-10-CM | POA: Diagnosis not present

## 2019-09-05 DIAGNOSIS — M79605 Pain in left leg: Secondary | ICD-10-CM

## 2019-09-05 DIAGNOSIS — R293 Abnormal posture: Secondary | ICD-10-CM

## 2019-09-05 DIAGNOSIS — R262 Difficulty in walking, not elsewhere classified: Secondary | ICD-10-CM

## 2019-09-05 NOTE — Therapy (Signed)
[facility] [address], [place], [place], [address] Phone: [phone]   Fax:  [phone]  Physical Therapy Treatment  Patient Details  Name: [name] MRN: [id number] Date of Birth: 1945/01/30 Referring Provider (PT): Rennis Harding, MD   Encounter Date: 09/05/2019   PT End of Session - 09/05/19 1723    Visit Number 22    Date for PT Re-Evaluation 11/06/19    Authorization Type Medicare/BCBS    Authorization Time Period 09/06/19 to 11/06/19    Authorization - Visit Number 22    Progress Note Due on Visit 31    PT Start Time 1430    PT Stop Time 1515    PT Time Calculation (min) 45 min    Activity Tolerance Patient tolerated treatment well    Behavior During Therapy [facility] for tasks assessed/performed           Past Medical History:  Diagnosis Date  . Congenital musculoskeletal deformity of spine   . Hypertension   . Incontinence of bowel 09/2013  . Lumbago   . Lumbosacral spondylosis without myelopathy   . Sleep disorder    uses gabapentin at bedtime   . Spinal stenosis, lumbar region, without neurogenic claudication     Past Surgical History:  Procedure Laterality Date  . ANKLE SURGERY Right 2015   lengthened the achilles   . AUGMENTATION MAMMAPLASTY Bilateral 2000 or 2001 unsure    Left Implant has busted and is no longer visible  . BACK SURGERY  2015   fusion , Dr Patrice Paradise at [facility]; has 2 metal rods in place , fusion from t2 to s1   . BACK SURGERY  01/2017   Dr Rennis Harding ; repair of cracked titanium rod in back lumbar   . bunionectomy  Bilateral 1980s   multiple   . COLONOSCOPY     with polypectomy ; q 5 years   . FOOT SURGERY  07/2011  . SHOULDER SURGERY Left 2013   rotator cuff   . TOTAL HIP ARTHROPLASTY Right 05/08/2017   Procedure: RIGHT TOTAL HIP ARTHROPLASTY ANTERIOR APPROACH;  Surgeon: Paralee Cancel, MD;  Location: [facility];  Service: Orthopedics;  Laterality: Right;  70 mins      There were no vitals filed for this visit.   Subjective Assessment - 09/05/19 1722    Subjective I was sore this AM doing my exercises.    Pertinent History rods/fusion T9 to pelvis    Currently in Pain? Yes    Pain Score 5     Pain Location Back    Pain Orientation Right;Mid    Pain Descriptors / Indicators Sore    Aggravating Factors  overdoing it    Pain Relieving Factors rest    Multiple Pain Sites No          Treatment: Patient seen for aquatic therapy today.  Treatment took place in water 2.5-4 feet deep depending upon activity.  Pt entered the pool via steps with heavy use of hand rails. Seated water bench with 75% submersion Pt performed seated LE AROM exercises 20x in all planes, Standing in mid chest/waist depth water pt performed water walking in all 4 directions 4 lengths of pool. VC for speed in order to generate appropriate current for resistance. Hip 3 ways 20x ea with min support of pool wall. VC to push/pull against water with a force appropriate. Core contractions in partial wall squat with blue noodle 5 sec hold 10x. Bil heel  raises 20x no UE support. White noodle knee extensions 10x bil, UE support required for balance.High knee marching with blue noodle 2 full lengths of pool.  Water bicycle 4 min with blue noodle. Decompression float x 2 min to end session.                             PT Short Term Goals - 09/03/19 1114      PT SHORT TERM GOAL #1   Title Pt will be independent and consistent with her initial HEP to increase LE strength and posture.    Time 6    Period Weeks    Status Achieved    Target Date 07/22/18      PT SHORT TERM GOAL #2   Title Pt will be able to complete sit to stand without significant weight shift to the Lt which will improve her efficiency with daily activity.    Time 6    Period Weeks    Status Achieved   Min weight shift            PT Long Term Goals - 09/03/19 1120      PT LONG TERM GOAL  #1   Title Pt will be able to ambulate 286f without the need for AD and with adequate foot clearance, minimal trunk lean.    Baseline met except for foot clearance worsened 1531f    Status Partially Met      PT LONG TERM GOAL #2   Title Pt will have 4+/5 MMT strength of the LEs for improved mechanics with daily activity.    Period Weeks    Status Partially Met      PT LONG TERM GOAL #3   Title Pt will have no more than 3 inch distance from C7 to the wall when standing with her back facing the wall, to reflect improvements in her posture and endurance.    Baseline 3.5 inches    Time 12    Status Partially Met      PT LONG TERM GOAL #4   Title Pt will be able to go 4 hours without her wearing her back brace during the day.    Baseline not wearing this in the past 2 weeks.    Time 12    Period Weeks    Status Achieved      PT LONG TERM GOAL #5   Title Pt will perform 5 x sit to stand in less than 13 seconds    Baseline 11 sec    Period Weeks    Status Achieved                 Plan - 09/05/19 1724    Clinical Impression Statement Pt arrives with moderate mid back pain. Throughout the aquatic session pt was bothered by her Rt medial elbow pain. She is attempting to use improved mechanics with her cane with appears to be the impetus of this pain. Pt was able to increase her speed with water walking with increases her resistance. Pt can stand more erect with greater ease while in the water. Pt did have some moments of increased pain that was helped by short stretching breaks.    Personal Factors and Comorbidities Age;Fitness;Comorbidity 2;Past/Current Experience    Comorbidities Rt THA, T9-pelvis fusion    Examination-Activity Limitations Locomotion Level;Transfers    Stability/Clinical Decision Making Evolving/Moderate complexity    Rehab Potential Good    PT  Frequency 2x / week    PT Duration 8 weeks    PT Treatment/Interventions ADLs/Self Care Home Management;Aquatic  Therapy;Cryotherapy;Moist Heat;Electrical Stimulation;Therapeutic activities;Therapeutic exercise;Balance training;Gait training;Neuromuscular re-education;Manual techniques;Dry needling;Taping;Patient/family education;Joint Manipulations    PT Next Visit Plan glute med isometric strength in different positions; hip abductor stretch; trunk strength progressions    PT Home Exercise Plan [id number]    Consulted and Agree with Plan of Care Patient           Patient will benefit from skilled therapeutic intervention in order to improve the following deficits and impairments:  Abnormal gait, Improper body mechanics, Pain, Increased muscle spasms, Decreased strength, Decreased range of motion, Decreased activity tolerance, Hypomobility, Impaired flexibility, Difficulty walking, Decreased balance, Postural dysfunction  Visit Diagnosis: Muscle weakness (generalized)  Difficulty in walking, not elsewhere classified  Abnormal posture  Pain in left leg     Problem List Patient Active Problem List   Diagnosis Date Noted  . Hip contracture, unspecified laterality 07/02/2018  . Overweight (BMI 25.0-29.9) 05/09/2017  . S/P right THA, AA 05/08/2017  . Chronic bilateral low back pain without sciatica 04/05/2015  . Disorder of sacroiliac joint 12/22/2014  . Chronic low back pain 12/22/2014  . Acquired scoliosis 11/27/2011  . Lumbosacral spondylosis without myelopathy 05/22/2011    Javanni Maring, PTA 09/05/2019, 5:28 PM  [facility] [address], [place], [place], [address] Phone: [phone]   Fax:  [phone]  Name: [name] MRN: [id number] Date of Birth: 21-Mar-1945

## 2019-09-09 ENCOUNTER — Other Ambulatory Visit: Payer: Self-pay

## 2019-09-09 ENCOUNTER — Ambulatory Visit: Payer: Medicare Other | Admitting: Physical Therapy

## 2019-09-09 ENCOUNTER — Encounter: Payer: Self-pay | Admitting: Physical Therapy

## 2019-09-09 DIAGNOSIS — D3611 Benign neoplasm of peripheral nerves and autonomic nervous system of face, head, and neck: Secondary | ICD-10-CM | POA: Diagnosis not present

## 2019-09-09 DIAGNOSIS — M79605 Pain in left leg: Secondary | ICD-10-CM

## 2019-09-09 DIAGNOSIS — M6281 Muscle weakness (generalized): Secondary | ICD-10-CM | POA: Diagnosis not present

## 2019-09-09 DIAGNOSIS — R262 Difficulty in walking, not elsewhere classified: Secondary | ICD-10-CM

## 2019-09-09 DIAGNOSIS — R293 Abnormal posture: Secondary | ICD-10-CM

## 2019-09-09 DIAGNOSIS — D3617 Benign neoplasm of peripheral nerves and autonomic nervous system of trunk, unspecified: Secondary | ICD-10-CM | POA: Diagnosis not present

## 2019-09-09 NOTE — Therapy (Signed)
[facility] [address], [place], [place], [address] Phone: [phone]   Fax:  [phone]  Physical Therapy Treatment  Patient Details  Name: [name] MRN: [id number] Date of Birth: 02/24/1945 Referring Provider (PT): Rennis Harding, MD   Encounter Date: 09/09/2019   PT End of Session - 09/09/19 1204    Visit Number 23    Date for PT Re-Evaluation 11/06/19    Authorization Type Medicare/BCBS    Authorization Time Period 09/06/19 to 11/06/19    Authorization - Visit Number 23    Progress Note Due on Visit 31    PT Start Time 1151    PT Stop Time 1230    PT Time Calculation (min) 39 min    Activity Tolerance Patient tolerated treatment well;No increased pain    Behavior During Therapy WFL for tasks assessed/performed           Past Medical History:  Diagnosis Date  . Congenital musculoskeletal deformity of spine   . Hypertension   . Incontinence of bowel 09/2013  . Lumbago   . Lumbosacral spondylosis without myelopathy   . Sleep disorder    uses gabapentin at bedtime   . Spinal stenosis, lumbar region, without neurogenic claudication     Past Surgical History:  Procedure Laterality Date  . ANKLE SURGERY Right 2015   lengthened the achilles   . AUGMENTATION MAMMAPLASTY Bilateral 2000 or 2001 unsure    Left Implant has busted and is no longer visible  . BACK SURGERY  2015   fusion , Dr Patrice Paradise at [facility]; has 2 metal rods in place , fusion from t2 to s1   . BACK SURGERY  01/2017   Dr Rennis Harding ; repair of cracked titanium rod in back lumbar   . bunionectomy  Bilateral 1980s   multiple   . COLONOSCOPY     with polypectomy ; q 5 years   . FOOT SURGERY  07/2011  . SHOULDER SURGERY Left 2013   rotator cuff   . TOTAL HIP ARTHROPLASTY Right 05/08/2017   Procedure: RIGHT TOTAL HIP ARTHROPLASTY ANTERIOR APPROACH;  Surgeon: Paralee Cancel, MD;  Location: [facility];  Service: Orthopedics;   Laterality: Right;  70 mins    There were no vitals filed for this visit.   Subjective Assessment - 09/09/19 1204    Subjective Pt states that things are going ok. Her Rt elbow is still bothering her alot during the day.    Pertinent History rods/fusion T9 to pelvis    Currently in Pain? No/denies                             [facility] Adult PT Treatment/Exercise - 09/09/19 0001      Ambulation/Gait   Gait Comments x79f with modifications to hand placement on SPC and avoiding wrist pronation      Knee/Hip Exercises: Stretches   Hip Flexor Stretch Left;3 reps;20 seconds    Hip Flexor Stretch Limitations hanging off side of bed     Other Knee/Hip Stretches Lt butterfly stretch 5x10 sec (2 trials) PT overpressure       Knee/Hip Exercises: Machines for Strengthening   Cybex Leg Press Seat 7: BLE x15 reps #170, #180 x10 reps       Knee/Hip Exercises: Standing   Other Standing Knee Exercises Rt tricep extension at power tower: 3x10 reps #15      Knee/Hip Exercises: Supine  Other Supine Knee/Hip Exercises Lt single leg clam yellow TB 2x10 reps                   PT Education - 09/09/19 1310    Education Details modification of activity to avoid elbow pain during the day.    Person(s) Educated Patient    Methods Explanation;Verbal cues    Comprehension Verbalized understanding;Returned demonstration            PT Short Term Goals - 09/03/19 1114      PT SHORT TERM GOAL #1   Title Pt will be independent and consistent with her initial HEP to increase LE strength and posture.    Time 6    Period Weeks    Status Achieved    Target Date 07/22/18      PT SHORT TERM GOAL #2   Title Pt will be able to complete sit to stand without significant weight shift to the Lt which will improve her efficiency with daily activity.    Time 6    Period Weeks    Status Achieved   Min weight shift            PT Long Term Goals - 09/03/19 1120      PT LONG TERM  GOAL #1   Title Pt will be able to ambulate 270f without the need for AD and with adequate foot clearance, minimal trunk lean.    Baseline met except for foot clearance worsened 1522f    Status Partially Met      PT LONG TERM GOAL #2   Title Pt will have 4+/5 MMT strength of the LEs for improved mechanics with daily activity.    Period Weeks    Status Partially Met      PT LONG TERM GOAL #3   Title Pt will have no more than 3 inch distance from C7 to the wall when standing with her back facing the wall, to reflect improvements in her posture and endurance.    Baseline 3.5 inches    Time 12    Status Partially Met      PT LONG TERM GOAL #4   Title Pt will be able to go 4 hours without her wearing her back brace during the day.    Baseline not wearing this in the past 2 weeks.    Time 12    Period Weeks    Status Achieved      PT LONG TERM GOAL #5   Title Pt will perform 5 x sit to stand in less than 13 seconds    Baseline 11 sec    Period Weeks    Status Achieved                 Plan - 09/09/19 1310    Clinical Impression Statement Pt continues to have increased Rt medial elbow pain with daily activity. PT discussed modifications to her daily routine to avoid wrist flexion/pronation. Pt was able to ambulate without elbow pain after adjustments were made. Remainder of the session targeted Lt hip flexibility limitations into hip extension and abduction. Pt is greatly limited into hips extension, unable to reach the table during modified Thomas stretch. She was provided a handout for this moving forward. Will continue with current POC.    Personal Factors and Comorbidities Age;Fitness;Comorbidity 2;Past/Current Experience    Comorbidities Rt THA, T9-pelvis fusion    Examination-Activity Limitations Locomotion Level;Transfers    Stability/Clinical Decision Making Evolving/Moderate complexity  Rehab Potential Good    PT Frequency 2x / week    PT Duration 8 weeks    PT  Treatment/Interventions ADLs/Self Care Home Management;Aquatic Therapy;Cryotherapy;Moist Heat;Electrical Stimulation;Therapeutic activities;Therapeutic exercise;Balance training;Gait training;Neuromuscular re-education;Manual techniques;Dry needling;Taping;Patient/family education;Joint Manipulations    PT Next Visit Plan glute med isometric strength in different positions; hip abductor and extensor stretch; trunk strength progressions    PT Home Exercise Plan [id number]    Consulted and Agree with Plan of Care Patient           Patient will benefit from skilled therapeutic intervention in order to improve the following deficits and impairments:  Abnormal gait, Improper body mechanics, Pain, Increased muscle spasms, Decreased strength, Decreased range of motion, Decreased activity tolerance, Hypomobility, Impaired flexibility, Difficulty walking, Decreased balance, Postural dysfunction  Visit Diagnosis: Muscle weakness (generalized)  Difficulty in walking, not elsewhere classified  Abnormal posture  Pain in left leg     Problem List Patient Active Problem List   Diagnosis Date Noted  . Hip contracture, unspecified laterality 07/02/2018  . Overweight (BMI 25.0-29.9) 05/09/2017  . S/P right THA, AA 05/08/2017  . Chronic bilateral low back pain without sciatica 04/05/2015  . Disorder of sacroiliac joint 12/22/2014  . Chronic low back pain 12/22/2014  . Acquired scoliosis 11/27/2011  . Lumbosacral spondylosis without myelopathy 05/22/2011    2:30 PM,09/09/19 Sherol Dade PT, DPT [facility] [address] [place], [place], [address] Phone: [phone]   Fax:  [phone]  Name: [name] MRN: [id number] Date of Birth: [birth year]

## 2019-09-11 ENCOUNTER — Other Ambulatory Visit: Payer: Self-pay

## 2019-09-11 ENCOUNTER — Encounter: Payer: Self-pay | Admitting: Physical Therapy

## 2019-09-11 ENCOUNTER — Ambulatory Visit: Payer: Medicare Other | Attending: Orthopaedic Surgery | Admitting: Physical Therapy

## 2019-09-11 DIAGNOSIS — R262 Difficulty in walking, not elsewhere classified: Secondary | ICD-10-CM | POA: Diagnosis present

## 2019-09-11 DIAGNOSIS — M6281 Muscle weakness (generalized): Secondary | ICD-10-CM | POA: Insufficient documentation

## 2019-09-11 DIAGNOSIS — M79605 Pain in left leg: Secondary | ICD-10-CM | POA: Diagnosis present

## 2019-09-11 DIAGNOSIS — R293 Abnormal posture: Secondary | ICD-10-CM

## 2019-09-11 NOTE — Therapy (Signed)
[facility] [address], [place], [place], [address] Phone: [phone]   Fax:  [phone]  Physical Therapy Treatment  Patient Details  Name: [name] MRN: [id number] Date of Birth: [birth year] Referring Provider (PT): Rennis Harding, MD   Encounter Date: 09/11/2019   PT End of Session - 09/11/19 1230    Visit Number 24    Date for PT Re-Evaluation 11/06/19    Authorization Type Medicare/BCBS    Authorization Time Period 09/06/19 to 11/06/19    Authorization - Visit Number 24    Progress Note Due on Visit 31    PT Start Time 1146    PT Stop Time 1225    PT Time Calculation (min) 39 min    Activity Tolerance Patient tolerated treatment well;No increased pain    Behavior During Therapy WFL for tasks assessed/performed           Past Medical History:  Diagnosis Date  . Congenital musculoskeletal deformity of spine   . Hypertension   . Incontinence of bowel 09/2013  . Lumbago   . Lumbosacral spondylosis without myelopathy   . Sleep disorder    uses gabapentin at bedtime   . Spinal stenosis, lumbar region, without neurogenic claudication     Past Surgical History:  Procedure Laterality Date  . ANKLE SURGERY Right 2015   lengthened the achilles   . AUGMENTATION MAMMAPLASTY Bilateral 2000 or 2001 unsure    Left Implant has busted and is no longer visible  . BACK SURGERY  2015   fusion , Dr Patrice Paradise at [facility]; has 2 metal rods in place , fusion from t2 to s1   . BACK SURGERY  01/2017   Dr Rennis Harding ; repair of cracked titanium rod in back lumbar   . bunionectomy  Bilateral 1980s   multiple   . COLONOSCOPY     with polypectomy ; q 5 years   . FOOT SURGERY  07/2011  . SHOULDER SURGERY Left 2013   rotator cuff   . TOTAL HIP ARTHROPLASTY Right 05/08/2017   Procedure: RIGHT TOTAL HIP ARTHROPLASTY ANTERIOR APPROACH;  Surgeon: Paralee Cancel, MD;  Location: [facility];  Service: Orthopedics;  Laterality:  Right;  70 mins    There were no vitals filed for this visit.   Subjective Assessment - 09/11/19 1156    Subjective Pt states her back has been sore last night and this morning. She didn't sleep well last night.    Pertinent History rods/fusion T9 to pelvis    Currently in Pain? Yes    Pain Score 4     Pain Location Back    Pain Orientation Right;Left;Mid;Upper    Pain Descriptors / Indicators Dull;Aching    Pain Type Chronic pain    Pain Radiating Towards none                             [facility] Adult PT Treatment/Exercise - 09/11/19 0001      Knee/Hip Exercises: Stretches   Hip Flexor Stretch Left;3 reps;20 seconds    Hip Flexor Stretch Limitations hanging off side of bed     Other Knee/Hip Stretches Lt butterfly stretch 4 x20 sec       Manual Therapy   Manual therapy comments Lt contract relax during butterfly stretch x5 reps: passive Lt hip adductor strech, Lt quad stretch in modified thomas position 4x20 sec each    Soft tissue mobilization  STM Lt and Rt thoracic paraspinals, middle traps                  PT Education - 09/11/19 1230    Education Details encouraged HEP adherence and taking breaks during the day as needed    Person(s) Educated Patient    Methods Explanation    Comprehension Verbalized understanding            PT Short Term Goals - 09/03/19 1114      PT SHORT TERM GOAL #1   Title Pt will be independent and consistent with her initial HEP to increase LE strength and posture.    Time 6    Period Weeks    Status Achieved    Target Date 07/22/18      PT SHORT TERM GOAL #2   Title Pt will be able to complete sit to stand without significant weight shift to the Lt which will improve her efficiency with daily activity.    Time 6    Period Weeks    Status Achieved   Min weight shift            PT Long Term Goals - 09/03/19 1120      PT LONG TERM GOAL #1   Title Pt will be able to ambulate 250f without the need for AD  and with adequate foot clearance, minimal trunk lean.    Baseline met except for foot clearance worsened 1585f    Status Partially Met      PT LONG TERM GOAL #2   Title Pt will have 4+/5 MMT strength of the LEs for improved mechanics with daily activity.    Period Weeks    Status Partially Met      PT LONG TERM GOAL #3   Title Pt will have no more than 3 inch distance from C7 to the wall when standing with her back facing the wall, to reflect improvements in her posture and endurance.    Baseline 3.5 inches    Time 12    Status Partially Met      PT LONG TERM GOAL #4   Title Pt will be able to go 4 hours without her wearing her back brace during the day.    Baseline not wearing this in the past 2 weeks.    Time 12    Period Weeks    Status Achieved      PT LONG TERM GOAL #5   Title Pt will perform 5 x sit to stand in less than 13 seconds    Baseline 11 sec    Period Weeks    Status Achieved                 Plan - 09/11/19 1231    Clinical Impression Statement Pt arrived with higher levels of fatigue and back soreness following a night of little sleep. A majority of today's session focused on passive stretches and soft tissue mobilization to improve hip flexibility and decrease pain. Pt had improvements in Lt hip abduction following butterfly stretch with contract/relax technique and was able to complete clamshell in this range. Pt has significant muscle spasm in the mid to upper thoracic paraspinals and responded well to soft tissue mobilization reporting decrease in pain end of session. She was encouraged to pick back up on her HEP moving forward, as able.    Personal Factors and Comorbidities Age;Fitness;Comorbidity 2;Past/Current Experience    Comorbidities Rt THA, T9-pelvis fusion  Examination-Activity Limitations Locomotion Level;Transfers    Stability/Clinical Decision Making Evolving/Moderate complexity    Rehab Potential Good    PT Frequency 2x / week    PT  Duration 8 weeks    PT Treatment/Interventions ADLs/Self Care Home Management;Aquatic Therapy;Cryotherapy;Moist Heat;Electrical Stimulation;Therapeutic activities;Therapeutic exercise;Balance training;Gait training;Neuromuscular re-education;Manual techniques;Dry needling;Taping;Patient/family education;Joint Manipulations    PT Next Visit Plan glute med isometric strength in different positions; hip abductor and extensor stretch; trunk strength progressions    PT Home Exercise Plan [id number]    Consulted and Agree with Plan of Care Patient           Patient will benefit from skilled therapeutic intervention in order to improve the following deficits and impairments:  Abnormal gait, Improper body mechanics, Pain, Increased muscle spasms, Decreased strength, Decreased range of motion, Decreased activity tolerance, Hypomobility, Impaired flexibility, Difficulty walking, Decreased balance, Postural dysfunction  Visit Diagnosis: Muscle weakness (generalized)  Difficulty in walking, not elsewhere classified  Abnormal posture  Pain in left leg     Problem List Patient Active Problem List   Diagnosis Date Noted  . Hip contracture, unspecified laterality 07/02/2018  . Overweight (BMI 25.0-29.9) 05/09/2017  . S/P right THA, AA 05/08/2017  . Chronic bilateral low back pain without sciatica 04/05/2015  . Disorder of sacroiliac joint 12/22/2014  . Chronic low back pain 12/22/2014  . Acquired scoliosis 11/27/2011  . Lumbosacral spondylosis without myelopathy 05/22/2011    Sherol Dade 09/11/2019, 1:16 PM  [facility] [address], [place], [place], [address] Phone: [phone]   Fax:  [phone]  Name: [name] MRN: [id number] Date of Birth: 09-03-1945

## 2019-09-17 ENCOUNTER — Encounter: Payer: Self-pay | Admitting: Physical Therapy

## 2019-09-17 ENCOUNTER — Ambulatory Visit: Payer: Medicare Other | Admitting: Physical Therapy

## 2019-09-17 ENCOUNTER — Other Ambulatory Visit: Payer: Self-pay

## 2019-09-17 DIAGNOSIS — M6281 Muscle weakness (generalized): Secondary | ICD-10-CM

## 2019-09-17 DIAGNOSIS — R293 Abnormal posture: Secondary | ICD-10-CM

## 2019-09-17 DIAGNOSIS — R262 Difficulty in walking, not elsewhere classified: Secondary | ICD-10-CM

## 2019-09-17 DIAGNOSIS — M79605 Pain in left leg: Secondary | ICD-10-CM

## 2019-09-17 NOTE — Therapy (Signed)
[facility] [address], [place], [place], [address] Phone: [phone]   Fax:  [phone]  Physical Therapy Treatment  Patient Details  Name: [name] MRN: [id number] Date of Birth: 1945-10-02 Referring Provider (PT): Rennis Harding, MD   Encounter Date: 09/17/2019   PT End of Session - 09/17/19 1252    Visit Number 25    Date for PT Re-Evaluation 11/06/19    Authorization Type Medicare/BCBS    Authorization Time Period 09/06/19 to 11/06/19    Authorization - Visit Number 25    Progress Note Due on Visit 31    PT Start Time 1146    PT Stop Time 1229    PT Time Calculation (min) 43 min    Activity Tolerance Patient tolerated treatment well;No increased pain    Behavior During Therapy WFL for tasks assessed/performed           Past Medical History:  Diagnosis Date  . Congenital musculoskeletal deformity of spine   . Hypertension   . Incontinence of bowel 09/2013  . Lumbago   . Lumbosacral spondylosis without myelopathy   . Sleep disorder    uses gabapentin at bedtime   . Spinal stenosis, lumbar region, without neurogenic claudication     Past Surgical History:  Procedure Laterality Date  . ANKLE SURGERY Right 2015   lengthened the achilles   . AUGMENTATION MAMMAPLASTY Bilateral 2000 or 2001 unsure    Left Implant has busted and is no longer visible  . BACK SURGERY  2015   fusion , Dr Patrice Paradise at [facility]; has 2 metal rods in place , fusion from t2 to s1   . BACK SURGERY  01/2017   Dr Rennis Harding ; repair of cracked titanium rod in back lumbar   . bunionectomy  Bilateral 1980s   multiple   . COLONOSCOPY     with polypectomy ; q 5 years   . FOOT SURGERY  07/2011  . SHOULDER SURGERY Left 2013   rotator cuff   . TOTAL HIP ARTHROPLASTY Right 05/08/2017   Procedure: RIGHT TOTAL HIP ARTHROPLASTY ANTERIOR APPROACH;  Surgeon: Paralee Cancel, MD;  Location: [facility];  Service: Orthopedics;  Laterality:  Right;  70 mins    There were no vitals filed for this visit.   Subjective Assessment - 09/17/19 1147    Subjective Pt states that she walked 2 dogs half a mile up and down some hills. She had a lot of difficulty with this and had to stay in bed for the following 2 days. Her Rt elbow and knee are bothering her most since her walk.    Pertinent History rods/fusion T9 to pelvis    Currently in Pain? No/denies                             [facility] Adult PT Treatment/Exercise - 09/17/19 0001      Self-Care   Self-Care Other Self-Care Comments    Other Self-Care Comments  ways to modify daily activity and avoid excessive use of Rt UE lifting      Knee/Hip Exercises: Aerobic   Nustep L2 x13 min, PT present to discuss       Knee/Hip Exercises: Machines for Strengthening   Cybex Leg Press seat 7: BLE #180 3x10 reps; single leg 3x10 #80       Knee/Hip Exercises: Supine   Other Supine Knee/Hip Exercises Lt clamshell 2x10 reps yellow  TB          standing hip abduction isometric 10x5 sec hold against wall          PT Education - 09/17/19 1251    Education Details activity modification    Person(s) Educated Patient    Methods Explanation;Verbal cues    Comprehension Verbalized understanding            PT Short Term Goals - 09/03/19 1114      PT SHORT TERM GOAL #1   Title Pt will be independent and consistent with her initial HEP to increase LE strength and posture.    Time 6    Period Weeks    Status Achieved    Target Date 07/22/18      PT SHORT TERM GOAL #2   Title Pt will be able to complete sit to stand without significant weight shift to the Lt which will improve her efficiency with daily activity.    Time 6    Period Weeks    Status Achieved   Min weight shift            PT Long Term Goals - 09/03/19 1120      PT LONG TERM GOAL #1   Title Pt will be able to ambulate 247f without the need for AD and with adequate foot clearance, minimal  trunk lean.    Baseline met except for foot clearance worsened 1533f    Status Partially Met      PT LONG TERM GOAL #2   Title Pt will have 4+/5 MMT strength of the LEs for improved mechanics with daily activity.    Period Weeks    Status Partially Met      PT LONG TERM GOAL #3   Title Pt will have no more than 3 inch distance from C7 to the wall when standing with her back facing the wall, to reflect improvements in her posture and endurance.    Baseline 3.5 inches    Time 12    Status Partially Met      PT LONG TERM GOAL #4   Title Pt will be able to go 4 hours without her wearing her back brace during the day.    Baseline not wearing this in the past 2 weeks.    Time 12    Period Weeks    Status Achieved      PT LONG TERM GOAL #5   Title Pt will perform 5 x sit to stand in less than 13 seconds    Baseline 11 sec    Period Weeks    Status Achieved                 Plan - 09/17/19 1252    Clinical Impression Statement Pt had a flare up of her Rt elbow pain over the weekend after walking her dogs for half a mile. PT spent a portion of today's session educating her on the importance of modifying her activity to avoid irritating her elbow. PT was able to complete her exercises today without increase in back or elbow pain. Pt verbalized understanding of activity modifications to make at home moving forward.    Personal Factors and Comorbidities Age;Fitness;Comorbidity 2;Past/Current Experience    Comorbidities Rt THA, T9-pelvis fusion    Examination-Activity Limitations Locomotion Level;Transfers    Stability/Clinical Decision Making Evolving/Moderate complexity    Rehab Potential Good    PT Frequency 2x / week    PT Duration 8 weeks  PT Treatment/Interventions ADLs/Self Care Home Management;Aquatic Therapy;Cryotherapy;Moist Heat;Electrical Stimulation;Therapeutic activities;Therapeutic exercise;Balance training;Gait training;Neuromuscular re-education;Manual  techniques;Dry needling;Taping;Patient/family education;Joint Manipulations    PT Next Visit Plan glute med isometric strength in different positions; hip abductor and extensor stretch; trunk strength progressions    PT Home Exercise Plan [id number]    Consulted and Agree with Plan of Care Patient           Patient will benefit from skilled therapeutic intervention in order to improve the following deficits and impairments:  Abnormal gait, Improper body mechanics, Pain, Increased muscle spasms, Decreased strength, Decreased range of motion, Decreased activity tolerance, Hypomobility, Impaired flexibility, Difficulty walking, Decreased balance, Postural dysfunction  Visit Diagnosis: Muscle weakness (generalized)  Difficulty in walking, not elsewhere classified  Abnormal posture  Pain in left leg     Problem List Patient Active Problem List   Diagnosis Date Noted  . Hip contracture, unspecified laterality 07/02/2018  . Overweight (BMI 25.0-29.9) 05/09/2017  . S/P right THA, AA 05/08/2017  . Chronic bilateral low back pain without sciatica 04/05/2015  . Disorder of sacroiliac joint 12/22/2014  . Chronic low back pain 12/22/2014  . Acquired scoliosis 11/27/2011  . Lumbosacral spondylosis without myelopathy 05/22/2011    1:24 PM,09/17/19 Sherol Dade PT, DPT [facility] [address], [place], [place], [address] Phone: [phone]   Fax:  [phone]  Name: [name] MRN: [id number] Date of Birth: May 10, 1945

## 2019-09-22 ENCOUNTER — Other Ambulatory Visit: Payer: Self-pay | Admitting: Family Medicine

## 2019-09-22 DIAGNOSIS — Z1231 Encounter for screening mammogram for malignant neoplasm of breast: Secondary | ICD-10-CM

## 2019-09-25 ENCOUNTER — Ambulatory Visit: Payer: Medicare Other | Admitting: Physical Therapy

## 2019-09-25 ENCOUNTER — Encounter: Payer: Self-pay | Admitting: Physical Therapy

## 2019-09-25 ENCOUNTER — Other Ambulatory Visit: Payer: Self-pay

## 2019-09-25 DIAGNOSIS — M6281 Muscle weakness (generalized): Secondary | ICD-10-CM | POA: Diagnosis not present

## 2019-09-25 DIAGNOSIS — R293 Abnormal posture: Secondary | ICD-10-CM

## 2019-09-25 DIAGNOSIS — R262 Difficulty in walking, not elsewhere classified: Secondary | ICD-10-CM

## 2019-09-25 DIAGNOSIS — M79605 Pain in left leg: Secondary | ICD-10-CM

## 2019-09-25 NOTE — Therapy (Signed)
[facility] [address] [place], [place], [address] Phone: [phone]   Fax:  [phone]  Physical Therapy Treatment  Patient Details  Name: [name] MRN: [id number] Date of Birth: [birth year] Referring Provider (PT): Rennis Harding, MD   Encounter Date: 09/25/2019   PT End of Session - 09/25/19 1711    Visit Number 26    Date for PT Re-Evaluation 11/06/19    Authorization Type Medicare/BCBS    Authorization Time Period 09/06/19 to 11/06/19    Authorization - Visit Number 25    Progress Note Due on Visit 31    PT Start Time 1611    PT Stop Time 1700    PT Time Calculation (min) 49 min    Activity Tolerance Patient tolerated treatment well;No increased pain    Behavior During Therapy WFL for tasks assessed/performed           Past Medical History:  Diagnosis Date  . Congenital musculoskeletal deformity of spine   . Hypertension   . Incontinence of bowel 09/2013  . Lumbago   . Lumbosacral spondylosis without myelopathy   . Sleep disorder    uses gabapentin at bedtime   . Spinal stenosis, lumbar region, without neurogenic claudication     Past Surgical History:  Procedure Laterality Date  . ANKLE SURGERY Right 2015   lengthened the achilles   . AUGMENTATION MAMMAPLASTY Bilateral 2000 or 2001 unsure    Left Implant has busted and is no longer visible  . BACK SURGERY  2015   fusion , Dr Patrice Paradise at [facility]; has 2 metal rods in place , fusion from t2 to s1   . BACK SURGERY  01/2017   Dr Rennis Harding ; repair of cracked titanium rod in back lumbar   . bunionectomy  Bilateral 1980s   multiple   . COLONOSCOPY     with polypectomy ; q 5 years   . FOOT SURGERY  07/2011  . SHOULDER SURGERY Left 2013   rotator cuff   . TOTAL HIP ARTHROPLASTY Right 05/08/2017   Procedure: RIGHT TOTAL HIP ARTHROPLASTY ANTERIOR APPROACH;  Surgeon: Paralee Cancel, MD;  Location: [facility];  Service: Orthopedics;   Laterality: Right;  70 mins    There were no vitals filed for this visit.   Subjective Assessment - 09/25/19 1624    Subjective Pt states that she is leaning forward more today.    Pertinent History rods/fusion T9 to pelvis    Currently in Pain? No/denies                             [facility] Adult PT Treatment/Exercise - 09/25/19 0001      Knee/Hip Exercises: Aerobic   Nustep L2 x7 min PT discussing progress       Knee/Hip Exercises: Machines for Strengthening   Cybex Leg Press seat 7: BLE #180 3x10 reps, single leg #90 2x10 reps       Knee/Hip Exercises: Standing   Forward Lunges 2 sets;Both;15 reps    Forward Lunges Limitations LE on 2nd step; back LE driving the body forward, 2nd set with green TB around pelvis       Knee/Hip Exercises: Seated   Other Seated Knee/Hip Exercises rows 3x10 reps blue TB with neck retraction; neck retraction 2x10 reps       Knee/Hip Exercises: Sidelying   Hip ABduction Left;Strengthening;2 sets    Hip ABduction Limitations 5 sec  hold into       Manual Therapy   Manual therapy comments passive Lt hip abduction to end range with pt instructed hold for 5 sec; Lt butterfly stretch with over pressure 3x15 sec                   PT Education - 09/25/19 1631    Education Details technique with therex    Person(s) Educated Patient    Methods Explanation    Comprehension Verbalized understanding            PT Short Term Goals - 09/03/19 1114      PT SHORT TERM GOAL #1   Title Pt will be independent and consistent with her initial HEP to increase LE strength and posture.    Time 6    Period Weeks    Status Achieved    Target Date 07/22/18      PT SHORT TERM GOAL #2   Title Pt will be able to complete sit to stand without significant weight shift to the Lt which will improve her efficiency with daily activity.    Time 6    Period Weeks    Status Achieved   Min weight shift            PT Long Term Goals -  09/03/19 1120      PT LONG TERM GOAL #1   Title Pt will be able to ambulate 253f without the need for AD and with adequate foot clearance, minimal trunk lean.    Baseline met except for foot clearance worsened 1516f    Status Partially Met      PT LONG TERM GOAL #2   Title Pt will have 4+/5 MMT strength of the LEs for improved mechanics with daily activity.    Period Weeks    Status Partially Met      PT LONG TERM GOAL #3   Title Pt will have no more than 3 inch distance from C7 to the wall when standing with her back facing the wall, to reflect improvements in her posture and endurance.    Baseline 3.5 inches    Time 12    Status Partially Met      PT LONG TERM GOAL #4   Title Pt will be able to go 4 hours without her wearing her back brace during the day.    Baseline not wearing this in the past 2 weeks.    Time 12    Period Weeks    Status Achieved      PT LONG TERM GOAL #5   Title Pt will perform 5 x sit to stand in less than 13 seconds    Baseline 11 sec    Period Weeks    Status Achieved                 Plan - 09/25/19 1711    Clinical Impression Statement Pt's hip strength and glute activation are improving. Pt's passive hip abduction improved to 5 deg beyond neutral and she was able to hold this for 3 sec after PT took her to the end range. Pt was cued to complete neck retraction to improve her posture during seated rows. She demonstrated understanding of this. Pt denied pain end of session.    Personal Factors and Comorbidities Age;Fitness;Comorbidity 2;Past/Current Experience    Comorbidities Rt THA, T9-pelvis fusion    Examination-Activity Limitations Locomotion Level;Transfers    Stability/Clinical Decision Making Evolving/Moderate complexity  Rehab Potential Good    PT Frequency 2x / week    PT Duration 8 weeks    PT Treatment/Interventions ADLs/Self Care Home Management;Aquatic Therapy;Cryotherapy;Moist Heat;Electrical Stimulation;Therapeutic  activities;Therapeutic exercise;Balance training;Gait training;Neuromuscular re-education;Manual techniques;Dry needling;Taping;Patient/family education;Joint Manipulations    PT Next Visit Plan glute med isometric strength in different positions; hip abductor and extensor stretch; trunk strength progressions    PT Home Exercise Plan [id number]    Consulted and Agree with Plan of Care Patient           Patient will benefit from skilled therapeutic intervention in order to improve the following deficits and impairments:  Abnormal gait, Improper body mechanics, Pain, Increased muscle spasms, Decreased strength, Decreased range of motion, Decreased activity tolerance, Hypomobility, Impaired flexibility, Difficulty walking, Decreased balance, Postural dysfunction  Visit Diagnosis: Muscle weakness (generalized)  Difficulty in walking, not elsewhere classified  Abnormal posture  Pain in left leg     Problem List Patient Active Problem List   Diagnosis Date Noted  . Hip contracture, unspecified laterality 07/02/2018  . Overweight (BMI 25.0-29.9) 05/09/2017  . S/P right THA, AA 05/08/2017  . Chronic bilateral low back pain without sciatica 04/05/2015  . Disorder of sacroiliac joint 12/22/2014  . Chronic low back pain 12/22/2014  . Acquired scoliosis 11/27/2011  . Lumbosacral spondylosis without myelopathy 05/22/2011    5:16 PM,09/25/19 Sherol Dade PT, DPT [facility] [address] [place], [place], [address] Phone: [phone]   Fax:  [phone]  Name: [name] MRN: [id number] Date of Birth: [birth year]

## 2019-10-03 ENCOUNTER — Ambulatory Visit: Payer: Medicare Other | Admitting: Physical Therapy

## 2019-10-03 ENCOUNTER — Encounter: Payer: Self-pay | Admitting: Physical Therapy

## 2019-10-03 ENCOUNTER — Other Ambulatory Visit: Payer: Self-pay

## 2019-10-03 DIAGNOSIS — R262 Difficulty in walking, not elsewhere classified: Secondary | ICD-10-CM

## 2019-10-03 DIAGNOSIS — M6281 Muscle weakness (generalized): Secondary | ICD-10-CM

## 2019-10-03 DIAGNOSIS — R293 Abnormal posture: Secondary | ICD-10-CM

## 2019-10-03 DIAGNOSIS — M79605 Pain in left leg: Secondary | ICD-10-CM

## 2019-10-03 NOTE — Therapy (Signed)
[facility] [address], [place], [place], [address] Phone: [phone]   Fax:  [phone]  Physical Therapy Treatment  Patient Details  Name: [name] MRN: [id number] Date of Birth: Apr 01, 1945 Referring Provider (PT): Rennis Harding, MD   Encounter Date: 10/03/2019   PT End of Session - 10/03/19 1600    Visit Number 27    Date for PT Re-Evaluation 11/06/19    Authorization Type Medicare/BCBS    Authorization Time Period 09/06/19 to 11/06/19    Authorization - Visit Number 26    Progress Note Due on Visit 31    PT Start Time 1345    PT Stop Time 1430    PT Time Calculation (min) 45 min    Activity Tolerance Patient tolerated treatment well    Behavior During Therapy [facility] for tasks assessed/performed           Past Medical History:  Diagnosis Date  . Congenital musculoskeletal deformity of spine   . Hypertension   . Incontinence of bowel 09/2013  . Lumbago   . Lumbosacral spondylosis without myelopathy   . Sleep disorder    uses gabapentin at bedtime   . Spinal stenosis, lumbar region, without neurogenic claudication     Past Surgical History:  Procedure Laterality Date  . ANKLE SURGERY Right 2015   lengthened the achilles   . AUGMENTATION MAMMAPLASTY Bilateral 2000 or 2001 unsure    Left Implant has busted and is no longer visible  . BACK SURGERY  2015   fusion , Dr Patrice Paradise at [facility]; has 2 metal rods in place , fusion from t2 to s1   . BACK SURGERY  01/2017   Dr Rennis Harding ; repair of cracked titanium rod in back lumbar   . bunionectomy  Bilateral 1980s   multiple   . COLONOSCOPY     with polypectomy ; q 5 years   . FOOT SURGERY  07/2011  . SHOULDER SURGERY Left 2013   rotator cuff   . TOTAL HIP ARTHROPLASTY Right 05/08/2017   Procedure: RIGHT TOTAL HIP ARTHROPLASTY ANTERIOR APPROACH;  Surgeon: Paralee Cancel, MD;  Location: [facility];  Service: Orthopedics;  Laterality: Right;  70 mins      There were no vitals filed for this visit.   Subjective Assessment - 10/03/19 1558    Subjective I am sore all the time. I think I need to be better about stretching daily but only doing the strengthening exercises 3x week. My Rt inner elbow still bothers me. I think I will call my PCP to get an order for PT.    Pertinent History rods/fusion T9 to pelvis    Currently in Pain? Yes    Pain Score 4     Pain Location Back    Pain Orientation Mid;Right;Left    Pain Descriptors / Indicators Sore    Aggravating Factors  Overdoing any activity, walking    Pain Relieving Factors rest, moving in the pool    Multiple Pain Sites No           Patient seen for aquatic therapy today.  Treatment took place in water 2.5-4 feet deep depending upon activity.  Pt entered the pool via steps with very heavy use of rails.  Seated water bench with 75% submersion Pt performed seated LE AROM exercises 20x in all planes, pt verbally discussed with PTA her ongoing difficulties with Rt medial elbow pain. Standing in mid chest/waist depth water pt performed  water walking in all 4 directions 2 lengths of pool. VC for speed in order to generate appropriate current for resistance. Pt required short decompression rest break in between direction changes. High knee marching with noodle: 1/2 length of pool 4x. Standing hip abduction 2x10 bil  Water bicycle with noodle x 6 min  Shoulder horizontal abd/add with yellow UE weights 20x, followed by breast stroke arms 10x Pt was given time to sit at water bench 5-10 min to let back relax after exercises.                             PT Short Term Goals - 09/03/19 1114      PT SHORT TERM GOAL #1   Title Pt will be independent and consistent with her initial HEP to increase LE strength and posture.    Time 6    Period Weeks    Status Achieved    Target Date 07/22/18      PT SHORT TERM GOAL #2   Title Pt will be able to complete sit to stand  without significant weight shift to the Lt which will improve her efficiency with daily activity.    Time 6    Period Weeks    Status Achieved   Min weight shift            PT Long Term Goals - 09/03/19 1120      PT LONG TERM GOAL #1   Title Pt will be able to ambulate 230f without the need for AD and with adequate foot clearance, minimal trunk lean.    Baseline met except for foot clearance worsened 1557f    Status Partially Met      PT LONG TERM GOAL #2   Title Pt will have 4+/5 MMT strength of the LEs for improved mechanics with daily activity.    Period Weeks    Status Partially Met      PT LONG TERM GOAL #3   Title Pt will have no more than 3 inch distance from C7 to the wall when standing with her back facing the wall, to reflect improvements in her posture and endurance.    Baseline 3.5 inches    Time 12    Status Partially Met      PT LONG TERM GOAL #4   Title Pt will be able to go 4 hours without her wearing her back brace during the day.    Baseline not wearing this in the past 2 weeks.    Time 12    Period Weeks    Status Achieved      PT LONG TERM GOAL #5   Title Pt will perform 5 x sit to stand in less than 13 seconds    Baseline 11 sec    Period Weeks    Status Achieved                 Plan - 10/03/19 1602    Clinical Impression Statement Pt arrives for aquatic therapy with  low level back soreness. Pt's soreness ramped up slightly towards the end of her session. Pt must be monitored to make sure she doesn't overdo her exercises. PTA encouraged pt to look into returning to her gym where she can do the [place] and possibly continuing with aquatic exercise. Pt [facility] [employer] may [facility] changing to [facility] [employer] in order to use the pool. Pt conitinues  to require short but frequent decompression rest breaks to manage back pain/soreness from increasing.    Personal Factors and Comorbidities Age;Fitness;Comorbidity  2;Past/Current Experience    Comorbidities Rt THA, T9-pelvis fusion    Examination-Activity Limitations Locomotion Level;Transfers    Stability/Clinical Decision Making Evolving/Moderate complexity    Rehab Potential Good    PT Frequency 2x / week    PT Duration 8 weeks    PT Treatment/Interventions ADLs/Self Care Home Management;Aquatic Therapy;Cryotherapy;Moist Heat;Electrical Stimulation;Therapeutic activities;Therapeutic exercise;Balance training;Gait training;Neuromuscular re-education;Manual techniques;Dry needling;Taping;Patient/family education;Joint Manipulations    PT Next Visit Plan glute med isometric strength in different positions; hip abductor and extensor stretch; trunk strength progressions    PT Home Exercise Plan [email or web address]    Consulted and Agree with Plan of Care Patient           Patient will benefit from skilled therapeutic intervention in order to improve the following deficits and impairments:  Abnormal gait, Improper body mechanics, Pain, Increased muscle spasms, Decreased strength, Decreased range of motion, Decreased activity tolerance, Hypomobility, Impaired flexibility, Difficulty walking, Decreased balance, Postural dysfunction  Visit Diagnosis: Muscle weakness (generalized)  Difficulty in walking, not elsewhere classified  Abnormal posture  Pain in left leg     Problem List Patient Active Problem List   Diagnosis Date Noted  . Hip contracture, unspecified laterality 07/02/2018  . Overweight (BMI 25.0-29.9) 05/09/2017  . S/P right THA, AA 05/08/2017  . Chronic bilateral low back pain without sciatica 04/05/2015  . Disorder of sacroiliac joint 12/22/2014  . Chronic low back pain 12/22/2014  . Acquired scoliosis 11/27/2011  . Lumbosacral spondylosis without myelopathy 05/22/2011    Kylea Berrong, PTA 10/03/2019, 4:07 PM  [facility] [address] [place], [place], [address] Phone:  [phone]   Fax:  [phone]  Name: [name] MRN: [id number] Date of Birth: 02-09-1945

## 2019-10-10 ENCOUNTER — Ambulatory Visit: Payer: Medicare Other | Attending: Orthopaedic Surgery | Admitting: Physical Therapy

## 2019-10-10 ENCOUNTER — Other Ambulatory Visit: Payer: Self-pay

## 2019-10-10 ENCOUNTER — Encounter: Payer: Self-pay | Admitting: Physical Therapy

## 2019-10-10 DIAGNOSIS — R262 Difficulty in walking, not elsewhere classified: Secondary | ICD-10-CM | POA: Diagnosis present

## 2019-10-10 DIAGNOSIS — R293 Abnormal posture: Secondary | ICD-10-CM | POA: Diagnosis present

## 2019-10-10 DIAGNOSIS — M6281 Muscle weakness (generalized): Secondary | ICD-10-CM | POA: Diagnosis present

## 2019-10-10 DIAGNOSIS — M79605 Pain in left leg: Secondary | ICD-10-CM | POA: Insufficient documentation

## 2019-10-10 NOTE — Therapy (Signed)
.[facility] [address], [address] [place], [place], [address] Phone: [phone]   Fax:  [phone]  Physical Therapy Treatment  Patient Details  Name: [name] MRN: [id number] Date of Birth: 09/08/1945 Referring Provider (PT): Max Cohen, MD   Encounter Date: 10/10/2019   PT End of Session - 10/10/19 1604    Visit Number 28    Date for PT Re-Evaluation 11/06/19    Authorization Type Medicare/BCBS    Authorization Time Period 09/06/19 to 11/06/19    Authorization - Visit Number 27    Progress Note Due on Visit 31    PT Start Time 1215    PT Stop Time 1310    PT Time Calculation (min) 55 min    Activity Tolerance Patient tolerated treatment well    Behavior During Therapy WFL for tasks assessed/performed           Past Medical History:  Diagnosis Date  . Congenital musculoskeletal deformity of spine   . Hypertension   . Incontinence of bowel 09/2013  . Lumbago   . Lumbosacral spondylosis without myelopathy   . Sleep disorder    uses gabapentin at bedtime   . Spinal stenosis, lumbar region, without neurogenic claudication     Past Surgical History:  Procedure Laterality Date  . ANKLE SURGERY Right 2015   lengthened the achilles   . AUGMENTATION MAMMAPLASTY Bilateral 2000 or 2001 unsure    Left Implant has busted and is no longer visible  . BACK SURGERY  2015   fusion , Dr Cohen at [facility]; has 2 metal rods in place , fusion from t2 to s1   . BACK SURGERY  01/2017   Dr Max Cohen ; repair of cracked titanium rod in back lumbar   . bunionectomy  Bilateral 1980s   multiple   . COLONOSCOPY     with polypectomy ; q 5 years   . FOOT SURGERY  07/2011  . SHOULDER SURGERY Left 2013   rotator cuff   . TOTAL HIP ARTHROPLASTY Right 05/08/2017   Procedure: RIGHT TOTAL HIP ARTHROPLASTY ANTERIOR APPROACH;  Surgeon: Olin, Matthew, MD;  Location: [facility];  Service: Orthopedics;  Laterality: Right;  70 mins     There were no vitals filed for this visit.   Subjective Assessment - 10/10/19 1602    Subjective I am trying to stretch every day and do teh strengthening everyother day. I tried to walk my dog 3/4 of a mile and I paid.    Currently in Pain? Yes    Pain Score 5     Pain Location Back    Pain Orientation Mid    Pain Descriptors / Indicators Sore    Aggravating Factors  Overdoing any activity, walking    Pain Relieving Factors rest, stretching, being in the water    Multiple Pain Sites No           Patient seen for aquatic therapy today.  Treatment took place in water 2.5-4 feet deep depending upon activity.  Pt entered the pool via steps with heavy use of hand rails. Water temp 86.7 degrees.  Seated water bench with 75% submersion Pt performed seated LE AROM exercises 20x in all planes, Standing: Standing in mid chest/waist depth water pt performed water walking in all 4 directions 6 lengths of pool. VC for speed in order to generate appropriate current for resistance. Pt required decompression float in between direction changes to manage pain.  Hip circumduction   Bil 20x ea with min support of pool wall. VC to push/pull against water with a force appropriate. Bil heel raises 20x no UE support. White noodle knee extensions 10x RTLEl, UE support required for balance.High knee marching with blue noodle 2 full lengths of pool.   Water bicycle with blue noodle x 5min                            PT Short Term Goals - 09/03/19 1114      PT SHORT TERM GOAL #1   Title Pt will be independent and consistent with her initial HEP to increase LE strength and posture.    Time 6    Period Weeks    Status Achieved    Target Date 07/22/18      PT SHORT TERM GOAL #2   Title Pt will be able to complete sit to stand without significant weight shift to the Lt which will improve her efficiency with daily activity.    Time 6    Period Weeks    Status Achieved   Min weight  shift            PT Long Term Goals - 09/03/19 1120      PT LONG TERM GOAL #1   Title Pt will be able to ambulate 200ft without the need for AD and with adequate foot clearance, minimal trunk lean.    Baseline met except for foot clearance worsened 150ft+    Status Partially Met      PT LONG TERM GOAL #2   Title Pt will have 4+/5 MMT strength of the LEs for improved mechanics with daily activity.    Period Weeks    Status Partially Met      PT LONG TERM GOAL #3   Title Pt will have no more than 3 inch distance from C7 to the wall when standing with her back facing the wall, to reflect improvements in her posture and endurance.    Baseline 3.5 inches    Time 12    Status Partially Met      PT LONG TERM GOAL #4   Title Pt will be able to go 4 hours without her wearing her back brace during the day.    Baseline not wearing this in the past 2 weeks.    Time 12    Period Weeks    Status Achieved      PT LONG TERM GOAL #5   Title Pt will perform 5 x sit to stand in less than 13 seconds    Baseline 11 sec    Period Weeks    Status Achieved                 Plan - 10/10/19 1605    Clinical Impression Statement Pt arrives with moderate back pain. PTA discussed with patient the importance of deciding what her DC plan will be: HEP or joining a gym that has a pool. Pt verbally agreed to put some effort into this. Pt continues to have less pain when it comes to exercising when she is in the water, PTA encouraged pt to listen to her body more when exercising and take the appropriate breaks as this is also helpful to manage her back pain. Pt focused on contracting her RT glutes more during her standing exercises.    Personal Factors and Comorbidities Age;Fitness;Comorbidity 2;Past/Current Experience    Comorbidities Rt THA, T9-pelvis fusion      Examination-Activity Limitations Locomotion Level;Transfers    Stability/Clinical Decision Making Evolving/Moderate complexity    Rehab  Potential Good    PT Frequency 2x / week    PT Duration 8 weeks    PT Treatment/Interventions ADLs/Self Care Home Management;Aquatic Therapy;Cryotherapy;Moist Heat;Electrical Stimulation;Therapeutic activities;Therapeutic exercise;Balance training;Gait training;Neuromuscular re-education;Manual techniques;Dry needling;Taping;Patient/family education;Joint Manipulations    PT Next Visit Plan glute med isometric strength in different positions; hip abductor and extensor stretch; trunk strength progressions: Aquatics    PT Home Exercise Plan [email or web address]    Consulted and Agree with Plan of Care Patient           Patient will benefit from skilled therapeutic intervention in order to improve the following deficits and impairments:  Abnormal gait, Improper body mechanics, Pain, Increased muscle spasms, Decreased strength, Decreased range of motion, Decreased activity tolerance, Hypomobility, Impaired flexibility, Difficulty walking, Decreased balance, Postural dysfunction  Visit Diagnosis: Muscle weakness (generalized)  Difficulty in walking, not elsewhere classified  Abnormal posture  Pain in left leg     Problem List Patient Active Problem List   Diagnosis Date Noted  . Hip contracture, unspecified laterality 07/02/2018  . Overweight (BMI 25.0-29.9) 05/09/2017  . S/P right THA, AA 05/08/2017  . Chronic bilateral low back pain without sciatica 04/05/2015  . Disorder of sacroiliac joint 12/22/2014  . Chronic low back pain 12/22/2014  . Acquired scoliosis 11/27/2011  . Lumbosacral spondylosis without myelopathy 05/22/2011    COCHRAN,JENNIFER, PTA 10/10/2019, 4:11 PM  [facility] [address], [address] [place], [place], [address] Phone: [phone]   Fax:  [phone]  Name: [name] MRN: [id number] Date of Birth: 10/03/1945   

## 2019-10-14 ENCOUNTER — Other Ambulatory Visit: Payer: Self-pay

## 2019-10-14 ENCOUNTER — Ambulatory Visit
Admission: RE | Admit: 2019-10-14 | Discharge: 2019-10-14 | Disposition: A | Discharge status: Home / Self Care | Payer: Medicare Other | Source: Ambulatory Visit | Attending: Family Medicine | Admitting: Family Medicine

## 2019-10-14 DIAGNOSIS — Z1231 Encounter for screening mammogram for malignant neoplasm of breast: Secondary | ICD-10-CM

## 2019-10-15 ENCOUNTER — Encounter: Payer: Self-pay | Admitting: Physical Therapy

## 2019-10-15 ENCOUNTER — Ambulatory Visit: Payer: Medicare Other | Admitting: Physical Therapy

## 2019-10-15 DIAGNOSIS — M79605 Pain in left leg: Secondary | ICD-10-CM

## 2019-10-15 DIAGNOSIS — M6281 Muscle weakness (generalized): Secondary | ICD-10-CM | POA: Diagnosis not present

## 2019-10-15 DIAGNOSIS — R262 Difficulty in walking, not elsewhere classified: Secondary | ICD-10-CM

## 2019-10-15 DIAGNOSIS — R293 Abnormal posture: Secondary | ICD-10-CM

## 2019-10-15 NOTE — Patient Instructions (Signed)
Access Code: [email or web address] URL: [email or web address] Date: 10/15/2019 Prepared by: Almyra Free  Exercises Sit to Stand - 1 x daily - 7 x weekly - 10 reps - 3 sets Standing Anti-Rotation Press with Anchored Resistance - 1 x daily - 7 x weekly - 3 sets - 10 reps Doorway Pec Stretch at 60 Elevation - 1 x daily - 7 x weekly - 3 sets - 10 reps Supine Butterfly Groin Stretch - 1 x daily - 7 x weekly - 10 reps - 10 seconds hold Standing Row with Anchored Resistance - 1 x daily - 7 x weekly - 3 sets - 10 reps Standing Hip Abduction with Resistance at Ankles and Unilateral Counter Support - 1 x daily - 7 x weekly - 3 sets - 5 reps Bridge with Hip Abduction and Resistance - 1 x daily - 3 x weekly - 1-3 sets - 10 reps - 10 sec hold Supine Hip Abduction with Resistance at Ankles - 1 x daily - 7 x weekly - 1-3 sets - 10 reps - 5-10 sec hold Seated Hip Flexor Stretch - 2 x daily - 7 x weekly - 3 reps - 1 sets - 30-60 sec hold Clamshell with Resistance - 1 x daily - 7 x weekly - 1-3 sets - 10 reps

## 2019-10-15 NOTE — Therapy (Addendum)
[facility] [address], [place], [place], [address] Phone: [phone]   Fax:  [phone]  Physical Therapy Treatment  Patient Details  Name: [name] MRN: [id number] Date of Birth: 08/16/1945 Referring Provider (PT): Rennis Harding, MD   Encounter Date: 10/15/2019   PT End of Session - 10/15/19 1145    Visit Number 29    Date for PT Re-Evaluation 11/06/19    Authorization Type Medicare/BCBS    Authorization Time Period 09/06/19 to 11/06/19    Authorization - Visit Number 29    Progress Note Due on Visit 31    PT Start Time 1100    PT Stop Time 1146    PT Time Calculation (min) 46 min    Activity Tolerance Patient tolerated treatment well    Behavior During Therapy [facility] for tasks assessed/performed           Past Medical History:  Diagnosis Date  . Congenital musculoskeletal deformity of spine   . Hypertension   . Incontinence of bowel 09/2013  . Lumbago   . Lumbosacral spondylosis without myelopathy   . Sleep disorder    uses gabapentin at bedtime   . Spinal stenosis, lumbar region, without neurogenic claudication     Past Surgical History:  Procedure Laterality Date  . ANKLE SURGERY Right 2015   lengthened the achilles   . AUGMENTATION MAMMAPLASTY Bilateral 2000 or 2001 unsure    Left Implant has busted and is no longer visible  . BACK SURGERY  2015   fusion , Dr Patrice Paradise at [facility]; has 2 metal rods in place , fusion from t2 to s1   . BACK SURGERY  01/2017   Dr Rennis Harding ; repair of cracked titanium rod in back lumbar   . BREAST EXCISIONAL BIOPSY Left    30 yrs ago  . bunionectomy  Bilateral 1980s   multiple   . COLONOSCOPY     with polypectomy ; q 5 years   . FOOT SURGERY  07/2011  . SHOULDER SURGERY Left 2013   rotator cuff   . TOTAL HIP ARTHROPLASTY Right 05/08/2017   Procedure: RIGHT TOTAL HIP ARTHROPLASTY ANTERIOR APPROACH;  Surgeon: Paralee Cancel, MD;  Location: [facility];   Service: Orthopedics;  Laterality: Right;  70 mins    There were no vitals filed for this visit.   Subjective Assessment - 10/15/19 1100    Subjective I did some stretching this morning. I've been cutting back on my exercises so I'm not so sore.    Pertinent History rods/fusion T9 to pelvis    How long can you walk comfortably? 1/4 mile or 15-20 min    Patient Stated Goals walking without a cane, improve posture    Currently in Pain? Yes    Pain Score 2     Pain Location Back    Pain Orientation Mid    Pain Descriptors / Indicators Sore    Pain Type Chronic pain              [facility] PT Assessment - 10/15/19 0001      Strength   Overall Strength Comments bil hip flex 5/5; Rt hip ABD 4+5, ext 4+/5; Left hip ABD/Ext 3+                         [facility] Adult PT Treatment/Exercise - 10/15/19 0001      Ambulation/Gait   Ambulation/Gait Yes    Ambulation/Gait  Assistance 7: Independent    Ambulation Distance (Feet) 400 Feet    Ambulation Surface Level    Gait Comments trendelenberg left; decreased heel strike right after 150' and pain in left groin      Knee/Hip Exercises: Stretches   Hip Flexor Stretch Both;1 rep;60 seconds    Hip Flexor Stretch Limitations seated      Knee/Hip Exercises: Aerobic   Nustep L2 x7 min PT discussing progress       Knee/Hip Exercises: Standing   Hip Abduction Both;2 sets;10 reps    Abduction Limitations momentum used with left side    Other Standing Knee Exercises hip hike x 15 bil with one UE assist when standing on left and right toe touch      Knee/Hip Exercises: Supine   Bridges Both;1 set;5 reps    Bridges Limitations 10 sec hold with black loop for ABD    Other Supine Knee/Hip Exercises hip ABD 10 sec hold x 10 with black loop      Knee/Hip Exercises: Sidelying   Hip ABduction Limitations unable to get full range                  PT Education - 10/15/19 1412    Education Details HEP progressed for gluteal  strengthening and seated hip flexor stretche    Person(s) Educated Patient    Methods Explanation;Demonstration;Handout    Comprehension Verbalized understanding;Returned demonstration            PT Short Term Goals - 09/03/19 1114      PT SHORT TERM GOAL #1   Title Pt will be independent and consistent with her initial HEP to increase LE strength and posture.    Time 6    Period Weeks    Status Achieved    Target Date 07/22/18      PT SHORT TERM GOAL #2   Title Pt will be able to complete sit to stand without significant weight shift to the Lt which will improve her efficiency with daily activity.    Time 6    Period Weeks    Status Achieved   Min weight shift            PT Long Term Goals - 10/15/19 1418      PT LONG TERM GOAL #1   Title Pt will be able to ambulate 27f without the need for AD and with adequate foot clearance, minimal trunk lean.    Baseline met except for foot clearance worsened 1521f and groin pain left    Status Partially Met      PT LONG TERM GOAL #2   Title Pt will have 4+/5 MMT strength of the LEs for improved mechanics with daily activity.    Baseline met on right; left still 3+/5 with ABD and ext    Status Partially Met                 Plan - 10/15/19 1413    Clinical Impression Statement Patient continues to have mid back pain. She reports doing just 2-3 stretches in the morning helps this a lot. [facility] We focused on gluteal strengthening today. Her left hip ABD and ext is still 3+/5 while the right side is almost 5/5. She did well with new TE and could feel the muscles in her left hip contracting. These new exercises were added to HEP. She was able to walk 400 feet without her  cane today, however she started getting left groin pain at 150 ft and right ankle DF continues to weaken at this stage. She was able to improve this with cueing.    Personal Factors and Comorbidities  Age;Fitness;Comorbidity 2;Past/Current Experience    Comorbidities Rt THA, T9-pelvis fusion    Examination-Activity Limitations Locomotion Level;Transfers    PT Frequency 2x / week    PT Duration 8 weeks    PT Treatment/Interventions ADLs/Self Care Home Management;Aquatic Therapy;Cryotherapy;Moist Heat;Electrical Stimulation;Therapeutic activities;Therapeutic exercise;Balance training;Gait training;Neuromuscular re-education;Manual techniques;Dry needling;Taping;Patient/family education;Joint Manipulations    PT Next Visit Plan FOTO/progress note/ check posture goal/ plans to d/c. glute med isometric strength in different positions; hip abductor and extensor stretch; trunk strength progressions: Aquatics    PT Home Exercise Plan [id number]           Patient will benefit from skilled therapeutic intervention in order to improve the following deficits and impairments:  Abnormal gait, Improper body mechanics, Pain, Increased muscle spasms, Decreased strength, Decreased range of motion, Decreased activity tolerance, Hypomobility, Impaired flexibility, Difficulty walking, Decreased balance, Postural dysfunction  Visit Diagnosis: Muscle weakness (generalized)  Difficulty in walking, not elsewhere classified  Abnormal posture  Pain in left leg     Problem List Patient Active Problem List   Diagnosis Date Noted  . Hip contracture, unspecified laterality 07/02/2018  . Overweight (BMI 25.0-29.9) 05/09/2017  . S/P right THA, AA 05/08/2017  . Chronic bilateral low back pain without sciatica 04/05/2015  . Disorder of sacroiliac joint 12/22/2014  . Chronic low back pain 12/22/2014  . Acquired scoliosis 11/27/2011  . Lumbosacral spondylosis without myelopathy 05/22/2011    Madelyn Flavors PT 10/15/2019, 2:27 PM  [facility] [address] [place], [place], [address] Phone: [phone]   Fax:  [phone]  Name: Julien Berryman MRN: [id number] Date of Birth: December 01, 1945

## 2019-10-17 ENCOUNTER — Ambulatory Visit: Payer: Medicare Other | Admitting: Physical Therapy

## 2019-10-17 ENCOUNTER — Encounter: Payer: Self-pay | Admitting: Physical Therapy

## 2019-10-17 ENCOUNTER — Other Ambulatory Visit: Payer: Self-pay

## 2019-10-17 DIAGNOSIS — M6281 Muscle weakness (generalized): Secondary | ICD-10-CM

## 2019-10-17 DIAGNOSIS — R293 Abnormal posture: Secondary | ICD-10-CM

## 2019-10-17 DIAGNOSIS — M79605 Pain in left leg: Secondary | ICD-10-CM

## 2019-10-17 DIAGNOSIS — R262 Difficulty in walking, not elsewhere classified: Secondary | ICD-10-CM

## 2019-10-17 NOTE — Therapy (Signed)
[facility] [address] [place], [place], [address] Phone: [phone]   Fax:  [phone]  Physical Therapy Treatment  Patient Details  Name: [name] MRN: [id number] Date of Birth: Dec 21, 1945 Referring Provider (PT): Rennis Harding, MD   Encounter Date: 10/17/2019   PT End of Session - 10/17/19 1520    Visit Number 30    Date for PT Re-Evaluation 11/06/19    Authorization Type Medicare/BCBS    Authorization Time Period 09/06/19 to 11/06/19    Authorization - Visit Number 30    Progress Note Due on Visit 31    PT Start Time 1220    PT Stop Time 1305    PT Time Calculation (min) 45 min    Activity Tolerance Patient tolerated treatment well    Behavior During Therapy [facility] for tasks assessed/performed           Past Medical History:  Diagnosis Date  . Congenital musculoskeletal deformity of spine   . Hypertension   . Incontinence of bowel 09/2013  . Lumbago   . Lumbosacral spondylosis without myelopathy   . Sleep disorder    uses gabapentin at bedtime   . Spinal stenosis, lumbar region, without neurogenic claudication     Past Surgical History:  Procedure Laterality Date  . ANKLE SURGERY Right 2015   lengthened the achilles   . AUGMENTATION MAMMAPLASTY Bilateral 2000 or 2001 unsure    Left Implant has busted and is no longer visible  . BACK SURGERY  2015   fusion , Dr Patrice Paradise at [facility]; has 2 metal rods in place , fusion from t2 to s1   . BACK SURGERY  01/2017   Dr Rennis Harding ; repair of cracked titanium rod in back lumbar   . BREAST EXCISIONAL BIOPSY Left    30 yrs ago  . bunionectomy  Bilateral 1980s   multiple   . COLONOSCOPY     with polypectomy ; q 5 years   . FOOT SURGERY  07/2011  . SHOULDER SURGERY Left 2013   rotator cuff   . TOTAL HIP ARTHROPLASTY Right 05/08/2017   Procedure: RIGHT TOTAL HIP ARTHROPLASTY ANTERIOR APPROACH;  Surgeon: Paralee Cancel, MD;  Location: [facility];   Service: Orthopedics;  Laterality: Right;  70 mins    There were no vitals filed for this visit.   Subjective Assessment - 10/17/19 1518    Subjective I liked pressing my hip into the wall. I feel like it tries to wake my muscles on teh left side up. I am frustrated with my RT shoe.    Pertinent History rods/fusion T9 to pelvis    Currently in Pain? Yes    Pain Score 2     Pain Location Back    Pain Orientation Mid    Pain Descriptors / Indicators Sore    Multiple Pain Sites No           Patient seen for aquatic therapy today.  Treatment took place in water 2.5-4 feet deep depending upon activity.  Pt entered the pool via steps with heavy use of hand rails. Water temp 87.6 degrees F  Seated water bench with 75% submersion Pt performed seated LE AROM exercises 20x in all planes, Standing in mid chest/waist depth water pt performed water walking in all 4 directions 4 lengths of pool. VC for speed in order to generate appropriate current for resistance. Pt uses blue noodle to assist in thoracic extension.  Walking on  heels 1/2 length of pool, then toes 1/2 length 2x each.  High knee marching 2 lengths of pool with noodle for postural assit. UE black weights: 1 min shld add/abd with VC to push faster in water to increase resistance. Then 1 min breast stroke arms.  Single leg stance LT 2x 20 sec: VC to lift RT rib vs shoulder and contract LT buttocks more.                              PT Short Term Goals - 09/03/19 1114      PT SHORT TERM GOAL #1   Title Pt will be independent and consistent with her initial HEP to increase LE strength and posture.    Time 6    Period Weeks    Status Achieved    Target Date 07/22/18      PT SHORT TERM GOAL #2   Title Pt will be able to complete sit to stand without significant weight shift to the Lt which will improve her efficiency with daily activity.    Time 6    Period Weeks    Status Achieved   Min weight shift              PT Long Term Goals - 10/15/19 1418      PT LONG TERM GOAL #1   Title Pt will be able to ambulate 245f without the need for AD and with adequate foot clearance, minimal trunk lean.    Baseline met except for foot clearance worsened 1513f and groin pain left    Status Partially Met      PT LONG TERM GOAL #2   Title Pt will have 4+/5 MMT strength of the LEs for improved mechanics with daily activity.    Baseline met on right; left still 3+/5 with ABD and ext    Status Partially Met                 Plan - 10/17/19 1520    Clinical Impression Statement Pt reports her insurance does not cover any exercise facility that has a pool, only gyms without a pool. Shehas 2 other options to check as she would prefer to continue her HEP in a pool. Pt required almost zero rest breaks in the pool today, only at the end of her session as her bacl did not hurt or spasm. Pt was able to try walking on her heels 1/2 length of pool, Rt ankle more difficult than LT.    Personal Factors and Comorbidities Age;Fitness;Comorbidity 2;Past/Current Experience    Comorbidities Rt THA, T9-pelvis fusion    Examination-Activity Limitations Locomotion Level;Transfers    Stability/Clinical Decision Making Evolving/Moderate complexity    Rehab Potential Good    PT Frequency 2x / week    PT Duration 8 weeks    PT Treatment/Interventions ADLs/Self Care Home Management;Aquatic Therapy;Cryotherapy;Moist Heat;Electrical Stimulation;Therapeutic activities;Therapeutic exercise;Balance training;Gait training;Neuromuscular re-education;Manual techniques;Dry needling;Taping;Patient/family education;Joint Manipulations    PT Home Exercise Plan [id number]           Patient will benefit from skilled therapeutic intervention in order to improve the following deficits and impairments:  Abnormal gait, Improper body mechanics, Pain, Increased muscle spasms, Decreased strength, Decreased range of motion, Decreased  activity tolerance, Hypomobility, Impaired flexibility, Difficulty walking, Decreased balance, Postural dysfunction  Visit Diagnosis: Muscle weakness (generalized)  Difficulty in walking, not elsewhere classified  Abnormal posture  Pain in left leg  Problem List Patient Active Problem List   Diagnosis Date Noted  . Hip contracture, unspecified laterality 07/02/2018  . Overweight (BMI 25.0-29.9) 05/09/2017  . S/P right THA, AA 05/08/2017  . Chronic bilateral low back pain without sciatica 04/05/2015  . Disorder of sacroiliac joint 12/22/2014  . Chronic low back pain 12/22/2014  . Acquired scoliosis 11/27/2011  . Lumbosacral spondylosis without myelopathy 05/22/2011    [name], PTA 10/17/2019, 3:25 PM  [facility] [address] [place], [place], [address] Phone: [phone]   Fax:  [phone]  Name: [name] MRN: [id number] Date of Birth: 04/13/1945

## 2019-10-22 ENCOUNTER — Encounter: Payer: Self-pay | Admitting: Physical Therapy

## 2019-10-22 ENCOUNTER — Ambulatory Visit: Payer: Medicare Other | Admitting: Physical Therapy

## 2019-10-22 ENCOUNTER — Other Ambulatory Visit: Payer: Self-pay

## 2019-10-22 DIAGNOSIS — M79605 Pain in left leg: Secondary | ICD-10-CM

## 2019-10-22 DIAGNOSIS — R262 Difficulty in walking, not elsewhere classified: Secondary | ICD-10-CM

## 2019-10-22 DIAGNOSIS — M6281 Muscle weakness (generalized): Secondary | ICD-10-CM

## 2019-10-22 DIAGNOSIS — R293 Abnormal posture: Secondary | ICD-10-CM

## 2019-10-22 NOTE — Therapy (Signed)
[facility] [address], [place], [place], [address] Phone: [phone]   Fax:  [phone]  Physical Therapy Treatment and Progress Note  Patient Details  Name: [name] MRN: [id number] Date of Birth: Dec 31, 1945 Referring Provider (PT): Rennis Harding, MD  Physical Therapy Progress Note  Dates of Reporting Period: 06/05/19 to 10/22/19   Encounter Date: 10/22/2019   PT End of Session - 10/22/19 1144    Visit Number 31    Date for PT Re-Evaluation 11/06/19    Authorization Type Medicare/BCBS    Authorization Time Period 09/06/19 to 11/06/19    Progress Note Due on Visit 41    PT Start Time 1101    PT Stop Time 1145    PT Time Calculation (min) 44 min    Activity Tolerance Patient tolerated treatment well    Behavior During Therapy [facility] for tasks assessed/performed           Past Medical History:  Diagnosis Date   Congenital musculoskeletal deformity of spine    Hypertension    Incontinence of bowel 09/2013   Lumbago    Lumbosacral spondylosis without myelopathy    Sleep disorder    uses gabapentin at bedtime    Spinal stenosis, lumbar region, without neurogenic claudication     Past Surgical History:  Procedure Laterality Date   ANKLE SURGERY Right 2015   lengthened the achilles    AUGMENTATION MAMMAPLASTY Bilateral 2000 or 2001 unsure    Left Implant has busted and is no longer visible   BACK SURGERY  2015   fusion , Dr Patrice Paradise at [facility]; has 2 metal rods in place , fusion from t2 to s1    BACK SURGERY  01/2017   Dr Rennis Harding ; repair of cracked titanium rod in back lumbar    BREAST EXCISIONAL BIOPSY Left    30 yrs ago   bunionectomy  Bilateral 1980s   multiple    COLONOSCOPY     with polypectomy ; q 5 years    FOOT SURGERY  07/2011   SHOULDER SURGERY Left 2013   rotator cuff    TOTAL HIP ARTHROPLASTY Right 05/08/2017   Procedure: RIGHT TOTAL HIP ARTHROPLASTY  ANTERIOR APPROACH;  Surgeon: Paralee Cancel, MD;  Location: [facility];  Service: Orthopedics;  Laterality: Right;  70 mins    There were no vitals filed for this visit.   Subjective Assessment - 10/22/19 1107    Subjective right knee still turning in when walking may get brace; the left hip is really bothering her with walking and her upper back is hurting today.    Pertinent History rods/fusion T9 to pelvis    How long can you walk comfortably? 1/4 mile or 15-20 min    Patient Stated Goals walking without a cane, improve posture    Currently in Pain? Yes    Pain Score 5     Pain Location Back    Pain Orientation Mid    Pain Descriptors / Indicators Sore    Pain Type Chronic pain                             [facility] Adult PT Treatment/Exercise - 10/22/19 0001      Self-Care   Self-Care Other Self-Care Comments    Other Self-Care Comments  MRR with ballupper back      Knee/Hip Exercises: Stretches   Active Hamstring Stretch Right;Left;2 reps;60  seconds    Hip Flexor Stretch Both;30 seconds    Hip Flexor Stretch Limitations on 3rd step lunge    Other Knee/Hip Stretches standing bil hip ADD stretch on stairs x 10 active then prolonged hold x 20 sec       Knee/Hip Exercises: Aerobic   Nustep L2 x7 min PT discussing progress       Knee/Hip Exercises: Supine   Bridges Both;10 reps    Bridges Limitations 10 sec hold with black loop for ABD    Other Supine Knee/Hip Exercises clam with black band 10 sec hold x 10                    PT Short Term Goals - 09/03/19 1114      PT SHORT TERM GOAL #1   Title Pt will be independent and consistent with her initial HEP to increase LE strength and posture.    Time 6    Period Weeks    Status Achieved    Target Date 07/22/18      PT SHORT TERM GOAL #2   Title Pt will be able to complete sit to stand without significant weight shift to the Lt which will improve her efficiency with daily activity.    Time 6     Period Weeks    Status Achieved   Min weight shift            PT Long Term Goals - 10/22/19 1123      PT LONG TERM GOAL #1   Title Pt will be able to ambulate 223f without the need for AD and with adequate foot clearance, minimal trunk lean.    Baseline met except for foot clearance worsened 1545f and groin pain left    Status Partially Met      PT LONG TERM GOAL #2   Title Pt will have 4+/5 MMT strength of the LEs for improved mechanics with daily activity.    Baseline met on right; left still 3+/5 with ABD and ext    Status Partially Met      PT LONG TERM GOAL #3   Title Pt will have no more than 3 inch distance from C7 to the wall when standing with her back facing the wall, to reflect improvements in her posture and endurance.    Baseline 3 and 3/8 inc    Status Partially Met      PT LONG TERM GOAL #4   Title Pt will be able to go 4 hours without her wearing her back brace during the day.    Baseline pt not wearing brace at all    Status Achieved      PT LONG TERM GOAL #5   Title Pt will perform 5 x sit to stand in less than 13 seconds    Status Achieved                 Plan - 10/22/19 1914    Clinical Impression Statement Patient is progressing toward her LTGs. She has met or partially met all of them however the deficits she has are still substantial. She has weakness in the left hip (3+/5) in ABD and ext, affecting her gait. She has improved with her ambulation, but experiences left groin pain around 150 ft and her right heel strike gets progressively weaker at this stage as well. She requires the use of a cart when shopping. Amb is limited to 15-20 min. Posturally, she has improved  her forward head position and she works hard to maintain this. She continues to have pain in the mid back which was worse today. She was able to decrease this with MFR using a tennis ball. Her endurance in the pool is improving as well and needs minimal rest breaks. She will benefit  from continued PT to address these deficits.    Personal Factors and Comorbidities Age;Fitness;Comorbidity 2;Past/Current Experience    Comorbidities Rt THA, T9-pelvis fusion    Examination-Activity Limitations Locomotion Level;Transfers    PT Frequency 2x / week    PT Duration 8 weeks    PT Treatment/Interventions ADLs/Self Care Home Management;Aquatic Therapy;Cryotherapy;Moist Heat;Electrical Stimulation;Therapeutic activities;Therapeutic exercise;Balance training;Gait training;Neuromuscular re-education;Manual techniques;Dry needling;Taping;Patient/family education;Joint Manipulations    PT Next Visit Plan PT mistaken re: d/c date. Continue POC.  glute med and hip extensor strength; Rt DF strength; Hip flexor/ADD stretches;  Aquatics    PT Home Exercise Plan [id number]           Patient will benefit from skilled therapeutic intervention in order to improve the following deficits and impairments:  Abnormal gait, Improper body mechanics, Pain, Increased muscle spasms, Decreased strength, Decreased range of motion, Decreased activity tolerance, Hypomobility, Impaired flexibility, Difficulty walking, Decreased balance, Postural dysfunction  Visit Diagnosis: Muscle weakness (generalized)  Difficulty in walking, not elsewhere classified  Abnormal posture  Pain in left leg     Problem List Patient Active Problem List   Diagnosis Date Noted   Hip contracture, unspecified laterality 07/02/2018   Overweight (BMI 25.0-29.9) 05/09/2017   S/P right THA, AA 05/08/2017   Chronic bilateral low back pain without sciatica 04/05/2015   Disorder of sacroiliac joint 12/22/2014   Chronic low back pain 12/22/2014   Acquired scoliosis 11/27/2011   Lumbosacral spondylosis without myelopathy 05/22/2011    Madelyn Flavors PT 10/22/2019, 7:24 PM   [facility] [address], [place], [place], [address] Phone: [phone]   Fax:   [phone]  Name: [name] MRN: [id number] Date of Birth: 1945-08-27

## 2019-10-24 ENCOUNTER — Encounter: Payer: Self-pay | Admitting: Physical Therapy

## 2019-10-24 ENCOUNTER — Ambulatory Visit: Payer: Medicare Other | Admitting: Physical Therapy

## 2019-10-24 ENCOUNTER — Other Ambulatory Visit: Payer: Self-pay

## 2019-10-24 DIAGNOSIS — R293 Abnormal posture: Secondary | ICD-10-CM

## 2019-10-24 DIAGNOSIS — M79605 Pain in left leg: Secondary | ICD-10-CM

## 2019-10-24 DIAGNOSIS — M6281 Muscle weakness (generalized): Secondary | ICD-10-CM

## 2019-10-24 DIAGNOSIS — R262 Difficulty in walking, not elsewhere classified: Secondary | ICD-10-CM

## 2019-10-24 NOTE — Therapy (Signed)
[facility] [address] [place], [place], [address] Phone: [phone]   Fax:  [phone]  Physical Therapy Treatment  Patient Details  Name: [name] MRN: [id number] Date of Birth: October 30, 1945 Referring Provider (PT): Rennis Harding, MD   Encounter Date: 10/24/2019   PT End of Session - 10/24/19 1454    Visit Number 32    Date for PT Re-Evaluation 11/06/19    Authorization Type Medicare/BCBS    Authorization Time Period 09/06/19 to 11/06/19    Authorization - Visit Number 30    Progress Note Due on Visit 51    PT Start Time 1215    PT Stop Time 1300    PT Time Calculation (min) 45 min    Activity Tolerance Patient tolerated treatment well    Behavior During Therapy [facility] for tasks assessed/performed           Past Medical History:  Diagnosis Date  . Congenital musculoskeletal deformity of spine   . Hypertension   . Incontinence of bowel 09/2013  . Lumbago   . Lumbosacral spondylosis without myelopathy   . Sleep disorder    uses gabapentin at bedtime   . Spinal stenosis, lumbar region, without neurogenic claudication     Past Surgical History:  Procedure Laterality Date  . ANKLE SURGERY Right 2015   lengthened the achilles   . AUGMENTATION MAMMAPLASTY Bilateral 2000 or 2001 unsure    Left Implant has busted and is no longer visible  . BACK SURGERY  2015   fusion , Dr Patrice Paradise at [facility]; has 2 metal rods in place , fusion from t2 to s1   . BACK SURGERY  01/2017   Dr Rennis Harding ; repair of cracked titanium rod in back lumbar   . BREAST EXCISIONAL BIOPSY Left    30 yrs ago  . bunionectomy  Bilateral 1980s   multiple   . COLONOSCOPY     with polypectomy ; q 5 years   . FOOT SURGERY  07/2011  . SHOULDER SURGERY Left 2013   rotator cuff   . TOTAL HIP ARTHROPLASTY Right 05/08/2017   Procedure: RIGHT TOTAL HIP ARTHROPLASTY ANTERIOR APPROACH;  Surgeon: Paralee Cancel, MD;  Location: [facility];   Service: Orthopedics;  Laterality: Right;  70 mins    There were no vitals filed for this visit.   Subjective Assessment - 10/24/19 1452    Subjective I was really worked last session I had to rest yesterday. I feel like I am standing straighter.    Pertinent History rods/fusion T9 to pelvis    Currently in Pain? Yes    Pain Score 3     Pain Location Back    Pain Orientation Mid    Pain Descriptors / Indicators Aching;Sore    Aggravating Factors  Overdoing ti    Pain Relieving Factors rest, being in the water    Multiple Pain Sites No           Patient seen for aquatic therapy today.  Treatment took place in water 2.5-4 feet deep depending upon activity.  Pt entered the pool via steps with heavy use of hand rails. Water temp 87.6 bdegrees F  Seated water bench with 75% submersion Pt performed seated LE AROM exercises 20x in all planes, Standing in mid chest/waist depth water pt performed water walking in all 4 directions 6 lengths of pool. VC for speed in order to generate appropriate current for resistance. Pt uses blue noodle  for slight thoracic extension. High knee marching 2 lengths of the pool. Heel walking 1/2 length of pool. While noodle knee/hip extension 2x10 RT, 10x LT.   Noodle bicycle x5 min.  Pt required 1 min decompression float in between each walking direction.                              PT Short Term Goals - 09/03/19 1114      PT SHORT TERM GOAL #1   Title Pt will be independent and consistent with her initial HEP to increase LE strength and posture.    Time 6    Period Weeks    Status Achieved    Target Date 07/22/18      PT SHORT TERM GOAL #2   Title Pt will be able to complete sit to stand without significant weight shift to the Lt which will improve her efficiency with daily activity.    Time 6    Period Weeks    Status Achieved   Min weight shift            PT Long Term Goals - 10/22/19 1123      PT LONG TERM GOAL #1    Title Pt will be able to ambulate 288f without the need for AD and with adequate foot clearance, minimal trunk lean.    Baseline met except for foot clearance worsened 1549f and groin pain left    Status Partially Met      PT LONG TERM GOAL #2   Title Pt will have 4+/5 MMT strength of the LEs for improved mechanics with daily activity.    Baseline met on right; left still 3+/5 with ABD and ext    Status Partially Met      PT LONG TERM GOAL #3   Title Pt will have no more than 3 inch distance from C7 to the wall when standing with her back facing the wall, to reflect improvements in her posture and endurance.    Baseline 3 and 3/8 inc    Status Partially Met      PT LONG TERM GOAL #4   Title Pt will be able to go 4 hours without her wearing her back brace during the day.    Baseline pt not wearing brace at all    Status Achieved      PT LONG TERM GOAL #5   Title Pt will perform 5 x sit to stand in less than 13 seconds    Status Achieved                 Plan - 10/24/19 1455    Clinical Impression Statement Pt focused on hip abduction and dorsiflexion (RT) strength. Pt most successful using the bouyancy and assistance of the water. No increased pain in the water. Pt is deciding between [employer] [facility] pool to continue with her aquatic exercise. She is concerned about Rt medial knee collapse and is considering a doctor consult.    Personal Factors and Comorbidities Age;Fitness;Comorbidity 2;Past/Current Experience    Comorbidities Rt THA, T9-pelvis fusion    Examination-Activity Limitations Locomotion Level;Transfers    Stability/Clinical Decision Making Evolving/Moderate complexity    Rehab Potential Good    PT Frequency 2x / week    PT Duration 8 weeks    PT Treatment/Interventions ADLs/Self Care Home Management;Aquatic Therapy;Cryotherapy;Moist Heat;Electrical Stimulation;Therapeutic activities;Therapeutic exercise;Balance training;Gait training;Neuromuscular  re-education;Manual techniques;Dry needling;Taping;Patient/family education;Joint Manipulations  PT Next Visit Plan Pt may want to organize HEP with PT next visit    PT Home Exercise Plan [email or web address]    Consulted and Agree with Plan of Care Patient           Patient will benefit from skilled therapeutic intervention in order to improve the following deficits and impairments:  Abnormal gait, Improper body mechanics, Pain, Increased muscle spasms, Decreased strength, Decreased range of motion, Decreased activity tolerance, Hypomobility, Impaired flexibility, Difficulty walking, Decreased balance, Postural dysfunction  Visit Diagnosis: Muscle weakness (generalized)  Difficulty in walking, not elsewhere classified  Abnormal posture  Pain in left leg     Problem List Patient Active Problem List   Diagnosis Date Noted  . Hip contracture, unspecified laterality 07/02/2018  . Overweight (BMI 25.0-29.9) 05/09/2017  . S/P right THA, AA 05/08/2017  . Chronic bilateral low back pain without sciatica 04/05/2015  . Disorder of sacroiliac joint 12/22/2014  . Chronic low back pain 12/22/2014  . Acquired scoliosis 11/27/2011  . Lumbosacral spondylosis without myelopathy 05/22/2011    Shayla Heming, PTA 10/24/2019, 2:59 PM  [facility] [address], [place], [place], [address] Phone: [phone]   Fax:  [phone]  Name: Tiffanye Hartmann MRN: [id number] Date of Birth: [birth year]

## 2019-10-29 ENCOUNTER — Ambulatory Visit: Payer: Medicare Other | Admitting: Physical Therapy

## 2019-10-29 ENCOUNTER — Other Ambulatory Visit: Payer: Self-pay

## 2019-10-29 ENCOUNTER — Encounter: Payer: Self-pay | Admitting: Physical Therapy

## 2019-10-29 DIAGNOSIS — R293 Abnormal posture: Secondary | ICD-10-CM

## 2019-10-29 DIAGNOSIS — M6281 Muscle weakness (generalized): Secondary | ICD-10-CM

## 2019-10-29 DIAGNOSIS — R262 Difficulty in walking, not elsewhere classified: Secondary | ICD-10-CM

## 2019-10-29 DIAGNOSIS — M79605 Pain in left leg: Secondary | ICD-10-CM

## 2019-10-29 NOTE — Patient Instructions (Signed)
Access Code: [email or web address] URL: [email or web address] Date: 10/29/2019 Prepared by: Almyra Free  Exercises Seated Side Bending with Arms Overhead with PLB - 1 x daily - 7 x weekly - 3 sets - 10 reps Seated Hip Flexor Stretch - 1 x daily - 7 x weekly - 3 reps - 1 sets - 30-60 sec hold Hip Flexor Stretch with Chair - 1 x daily - 7 x weekly - 1 sets - 3 reps - 30-60 hold Standing Hamstring Stretch on Chair - 1 x daily - 7 x weekly - 1 sets - 3 reps - 30-60 hold Doorway Pec Stretch at 60 Elevation - 1 x daily - 7 x weekly - 3 sets - 10 reps Supine Butterfly Groin Stretch - 1 x daily - 7 x weekly - 10 reps - 10 seconds hold Supine Piriformis Stretch with Foot on Ground - 1 x daily - 7 x weekly - 1 sets - 3 reps - 30-60 hold Sidelying Open Book Thoracic Lumbar Rotation and Extension - 1 x daily - 7 x weekly - 1 sets - 10 reps Sit to Stand - 1 x daily - 7 x weekly - 10 reps - 3 sets Standing Anti-Rotation Press with Anchored Resistance - 1 x daily - 7 x weekly - 3 sets - 10 reps Standing Row with Anchored Resistance - 1 x daily - 7 x weekly - 3 sets - 10 reps Standing Hip Abduction with Resistance at Ankles and Unilateral Counter Support - 1 x daily - 7 x weekly - 3 sets - 5 reps Standing Hip Extension with Resistance at Ankles and Counter Support - 1 x daily - 7 x weekly - 3 sets - 5 reps Bridge with Hip Abduction and Resistance - 1 x daily - 3 x weekly - 1-3 sets - 10 reps - 10 sec hold Supine Hip Abduction with Resistance at Ankles - 1 x daily - 7 x weekly - 1-3 sets - 10 reps - 5-10 sec hold Supine Shoulder Horizontal Abduction with Resistance - 1 x daily - 7 x weekly - 2 sets - 10 reps Bilateral Bent Leg Lift - 1 x daily - 7 x weekly - 3 sets - 10 reps Clamshell with Resistance - 1 x daily - 7 x weekly - 1-3 sets - 10 reps Sidelying Bent Knee Lift at 45 Degrees - 1 x daily - 7 x weekly - 1-3 sets - 10 reps Seated Long Arc Quad with Ankle Weight - 1 x daily - 7 x weekly - 1-3 sets - 10 reps  - 5 hold Seated Hip Flexion March with Ankle Weights - 1 x daily - 7 x weekly - 1-3 sets - 10 reps Seated Thoracic Extension Arms Overhead - 1 x daily - 7 x weekly - 10 reps - 1-2 sets - 2-3 sec hold Seated Diagonal Chops with Medicine Ball - 1 x daily - 7 x weekly - 10 reps - 1-2 sets - 2-3 hold Seated Shoulder Diagonal with Resistance - 1 x daily - 7 x weekly - 3 sets - 10 reps

## 2019-10-29 NOTE — Therapy (Signed)
[facility] [address] [place], [place], [address] Phone: [phone]   Fax:  [phone]  Physical Therapy Treatment  Patient Details  Name: [name] MRN: [id number] Date of Birth: 10-06-1945 Referring Provider (PT): Rennis Harding, MD   Encounter Date: 10/29/2019   PT End of Session - 10/29/19 1105    Visit Number 33    Date for PT Re-Evaluation 11/06/19    Authorization Type Medicare/BCBS    Authorization Time Period 09/06/19 to 11/06/19    Progress Note Due on Visit 107    PT Start Time 1106    PT Stop Time 1147    PT Time Calculation (min) 41 min    Activity Tolerance Patient tolerated treatment well    Behavior During Therapy [facility] for tasks assessed/performed           Past Medical History:  Diagnosis Date  . Congenital musculoskeletal deformity of spine   . Hypertension   . Incontinence of bowel 09/2013  . Lumbago   . Lumbosacral spondylosis without myelopathy   . Sleep disorder    uses gabapentin at bedtime   . Spinal stenosis, lumbar region, without neurogenic claudication     Past Surgical History:  Procedure Laterality Date  . ANKLE SURGERY Right 2015   lengthened the achilles   . AUGMENTATION MAMMAPLASTY Bilateral 2000 or 2001 unsure    Left Implant has busted and is no longer visible  . BACK SURGERY  2015   fusion , Dr Patrice Paradise at [facility]; has 2 metal rods in place , fusion from t2 to s1   . BACK SURGERY  01/2017   Dr Rennis Harding ; repair of cracked titanium rod in back lumbar   . BREAST EXCISIONAL BIOPSY Left    30 yrs ago  . bunionectomy  Bilateral 1980s   multiple   . COLONOSCOPY     with polypectomy ; q 5 years   . FOOT SURGERY  07/2011  . SHOULDER SURGERY Left 2013   rotator cuff   . TOTAL HIP ARTHROPLASTY Right 05/08/2017   Procedure: RIGHT TOTAL HIP ARTHROPLASTY ANTERIOR APPROACH;  Surgeon: Paralee Cancel, MD;  [facility]: [facility];  Service: Orthopedics;  Laterality:  Right;  70 mins    There were no vitals filed for this visit.   Subjective Assessment - 10/29/19 1106    Subjective I was able to put on pants standing on the left LE for the first time yesterday.  I feel like I'm standing straighter. Walking is still tiring.    How long can you walk comfortably? 1/2 mile    Patient Stated Goals walking without a cane, improve posture    Currently in Pain? No/denies                             [facility] Adult PT Treatment/Exercise - 10/29/19 0001      Knee/Hip Exercises: Aerobic   Nustep L4 x 7 PT present to discuss status      Knee/Hip Exercises: Supine   Bridges Both;10 reps    Bridges Limitations with black band      Knee/Hip Exercises: Sidelying   Hip ABduction Right;10 reps    Hip ABduction Limitations at 45 deg    Clams right x 10 black band (hard)                  PT Education - 10/29/19 1304  Education Details Pt's book of HEP was reviewed and consolidated to be more manageable and relevant to her current status.    Person(s) Educated Patient    Methods Explanation;Demonstration;Handout    Comprehension Verbalized understanding;Returned demonstration            PT Short Term Goals - 09/03/19 1114      PT SHORT TERM GOAL #1   Title Pt will be independent and consistent with her initial HEP to increase LE strength and posture.    Time 6    Period Weeks    Status Achieved    Target Date 07/22/18      PT SHORT TERM GOAL #2   Title Pt will be able to complete sit to stand without significant weight shift to the Lt which will improve her efficiency with daily activity.    Time 6    Period Weeks    Status Achieved   Min weight shift            PT Long Term Goals - 10/22/19 1123      PT LONG TERM GOAL #1   Title Pt will be able to ambulate 295f without the need for AD and with adequate foot clearance, minimal trunk lean.    Baseline met except for foot clearance worsened 1590f and groin pain  left    Status Partially Met      PT LONG TERM GOAL #2   Title Pt will have 4+/5 MMT strength of the LEs for improved mechanics with daily activity.    Baseline met on right; left still 3+/5 with ABD and ext    Status Partially Met      PT LONG TERM GOAL #3   Title Pt will have no more than 3 inch distance from C7 to the wall when standing with her back facing the wall, to reflect improvements in her posture and endurance.    Baseline 3 and 3/8 inc    Status Partially Met      PT LONG TERM GOAL #4   Title Pt will be able to go 4 hours without her wearing her back brace during the day.    Baseline pt not wearing brace at all    Status Achieved      PT LONG TERM GOAL #5   Title Pt will perform 5 x sit to stand in less than 13 seconds    Status Achieved                 Plan - 10/29/19 1253    Clinical Impression Statement Patient presents today without pain and requests a consolidation of her HEP. We consolidated these down to 24 exercises including stretching, strengthening and ROM. Overall she reports improvements in stength and posture and was able to stand on her Lt LE and put her pants on without UE support yesterday.    PT Treatment/Interventions ADLs/Self Care Home Management;Aquatic Therapy;Cryotherapy;Moist Heat;Electrical Stimulation;Therapeutic activities;Therapeutic exercise;Balance training;Gait training;Neuromuscular re-education;Manual techniques;Dry needling;Taping;Patient/family education;Joint Manipulations    PT Next Visit Plan Review any TE from HEP that patient has questioins with. Continue strengthening of hip ABD/EXT.    PT Home Exercise Plan [email or web address]           Patient will benefit from skilled therapeutic intervention in order to improve the following deficits and impairments:  Abnormal gait, Improper body mechanics, Pain, Increased muscle spasms, Decreased strength, Decreased range of motion, Decreased activity tolerance, Hypomobility, Impaired  flexibility, Difficulty walking, Decreased balance, Postural dysfunction  Visit Diagnosis: Muscle weakness (generalized)  Difficulty in walking, not elsewhere classified  Abnormal posture  Pain in left leg     Problem List Patient Active Problem List   Diagnosis Date Noted  . Hip contracture, unspecified laterality 07/02/2018  . Overweight (BMI 25.0-29.9) 05/09/2017  . S/P right THA, AA 05/08/2017  . Chronic bilateral low back pain without sciatica 04/05/2015  . Disorder of sacroiliac joint 12/22/2014  . Chronic low back pain 12/22/2014  . Acquired scoliosis 11/27/2011  . Lumbosacral spondylosis without myelopathy 05/22/2011    Madelyn Flavors PT 10/29/2019, 1:09 PM  [facility] [address], [place], [place], [address] Phone: [phone]   Fax:  [phone]  Name: [name] MRN: [id number] Date of Birth: 12/05/1945

## 2019-10-31 ENCOUNTER — Ambulatory Visit: Payer: Medicare Other | Admitting: Physical Therapy

## 2019-10-31 ENCOUNTER — Other Ambulatory Visit: Payer: Self-pay

## 2019-10-31 ENCOUNTER — Encounter: Payer: Self-pay | Admitting: Physical Therapy

## 2019-10-31 DIAGNOSIS — M6281 Muscle weakness (generalized): Secondary | ICD-10-CM | POA: Diagnosis not present

## 2019-10-31 DIAGNOSIS — R262 Difficulty in walking, not elsewhere classified: Secondary | ICD-10-CM

## 2019-10-31 DIAGNOSIS — M79605 Pain in left leg: Secondary | ICD-10-CM

## 2019-10-31 DIAGNOSIS — R293 Abnormal posture: Secondary | ICD-10-CM

## 2019-10-31 NOTE — Therapy (Signed)
[facility] [address], [place], [place], [address] Phone: [phone]   Fax:  [phone]  Physical Therapy Treatment  Patient Details  Name: [name] MRN: [id number] Date of Birth: 09/29/1945 Referring Provider (PT): Rennis Harding, MD   Encounter Date: 10/31/2019   PT End of Session - 10/31/19 0850    Visit Number 34    Date for PT Re-Evaluation 11/06/19    Authorization Type Medicare/BCBS    Authorization Time Period 09/06/19 to 11/06/19    Progress Note Due on Visit 41    PT Start Time 0848    PT Stop Time 0931    PT Time Calculation (min) 43 min    Activity Tolerance Patient tolerated treatment well    Behavior During Therapy [facility] for tasks assessed/performed           Past Medical History:  Diagnosis Date  . Congenital musculoskeletal deformity of spine   . Hypertension   . Incontinence of bowel 09/2013  . Lumbago   . Lumbosacral spondylosis without myelopathy   . Sleep disorder    uses gabapentin at bedtime   . Spinal stenosis, lumbar region, without neurogenic claudication     Past Surgical History:  Procedure Laterality Date  . ANKLE SURGERY Right 2015   lengthened the achilles   . AUGMENTATION MAMMAPLASTY Bilateral 2000 or 2001 unsure    Left Implant has busted and is no longer visible  . BACK SURGERY  2015   fusion , Dr Patrice Paradise at [facility]; has 2 metal rods in place , fusion from t2 to s1   . BACK SURGERY  01/2017   Dr Rennis Harding ; repair of cracked titanium rod in back lumbar   . BREAST EXCISIONAL BIOPSY Left    30 yrs ago  . bunionectomy  Bilateral 1980s   multiple   . COLONOSCOPY     with polypectomy ; q 5 years   . FOOT SURGERY  07/2011  . SHOULDER SURGERY Left 2013   rotator cuff   . TOTAL HIP ARTHROPLASTY Right 05/08/2017   Procedure: RIGHT TOTAL HIP ARTHROPLASTY ANTERIOR APPROACH;  Surgeon: Paralee Cancel, MD;  [facility];  Service: Orthopedics;  Laterality:  Right;  70 mins    There were no vitals filed for this visit.   Subjective Assessment - 10/31/19 0851    Subjective I have no pain this morning. I feel more organized with my home exercises.    Pertinent History rods/fusion T9 to pelvis    Currently in Pain? No/denies    Multiple Pain Sites No                             [facility] Adult PT Treatment/Exercise - 10/31/19 0001      Knee/Hip Exercises: Aerobic   Nustep L4 x 10 PTA present to discuss status      Knee/Hip Exercises: Sidelying   Hip ABduction Right;Both;2 sets;10 reps   3rd set 2# wt above knee 10x   Hip ABduction Limitations at 45 deg: knee bent       Manual Therapy   Soft tissue mobilization Lt later quad, RF, glute medius                    PT Short Term Goals - 09/03/19 1114      PT SHORT TERM GOAL #1   Title Pt will be independent and consistent with her  initial HEP to increase LE strength and posture.    Time 6    Period Weeks    Status Achieved    Target Date 07/22/18      PT SHORT TERM GOAL #2   Title Pt will be able to complete sit to stand without significant weight shift to the Lt which will improve her efficiency with daily activity.    Time 6    Period Weeks    Status Achieved   Min weight shift            PT Long Term Goals - 10/22/19 1123      PT LONG TERM GOAL #1   Title Pt will be able to ambulate 2103ft without the need for AD and with adequate foot clearance, minimal trunk lean.    Baseline met except for foot clearance worsened 159ft+ and groin pain left    Status Partially Met      PT LONG TERM GOAL #2   Title Pt will have 4+/5 MMT strength of the LEs for improved mechanics with daily activity.    Baseline met on right; left still 3+/5 with ABD and ext    Status Partially Met      PT LONG TERM GOAL #3   Title Pt will have no more than 3 inch distance from C7 to the wall when standing with her back facing the wall, to reflect improvements in her posture  and endurance.    Baseline 3 and 3/8 inc    Status Partially Met      PT LONG TERM GOAL #4   Title Pt will be able to go 4 hours without her wearing her back brace during the day.    Baseline pt not wearing brace at all    Status Achieved      PT LONG TERM GOAL #5   Title Pt will perform 5 x sit to stand in less than 13 seconds    Status Achieved                 Plan - 10/31/19 0850    Clinical Impression Statement Pt arrives with no pain today but experienced some increased pain along her LT groin after performing the Nustep. Pt's Lt RF and lateral quads were pretty tender and dense so we worked on those muscles which seemed to give her relief. She did remain with Lt groin pain, although less, at the end of todays session. Pt did demonstrate really good, improved tenchinque with Lt hip abduction with her knee slightly bent.    Personal Factors and Comorbidities Age;Fitness;Comorbidity 2;Past/Current Experience    Comorbidities Rt THA, T9-pelvis fusion    Examination-Activity Limitations Locomotion Level;Transfers    Stability/Clinical Decision Making Evolving/Moderate complexity    Rehab Potential Good    PT Frequency 2x / week    PT Duration 8 weeks    PT Treatment/Interventions ADLs/Self Care Home Management;Aquatic Therapy;Cryotherapy;Moist Heat;Electrical Stimulation;Therapeutic activities;Therapeutic exercise;Balance training;Gait training;Neuromuscular re-education;Manual techniques;Dry needling;Taping;Patient/family education;Joint Manipulations    PT Next Visit Plan prepare for DC next week    PT Home Exercise Plan [email or web address]    Consulted and Agree with Plan of Care Patient           Patient will benefit from skilled therapeutic intervention in order to improve the following deficits and impairments:  Abnormal gait, Improper body mechanics, Pain, Increased muscle spasms, Decreased strength, Decreased range of motion, Decreased activity tolerance, Hypomobility, Impaired  flexibility, Difficulty walking, Decreased balance, Postural dysfunction  Visit Diagnosis: Muscle weakness (generalized)  Difficulty in walking, not elsewhere classified  Abnormal posture  Pain in left leg     Problem List Patient Active Problem List   Diagnosis Date Noted  . Hip contracture, unspecified laterality 07/02/2018  . Overweight (BMI 25.0-29.9) 05/09/2017  . S/P right THA, AA 05/08/2017  . Chronic bilateral low back pain without sciatica 04/05/2015  . Disorder of sacroiliac joint 12/22/2014  . Chronic low back pain 12/22/2014  . Acquired scoliosis 11/27/2011  . Lumbosacral spondylosis without myelopathy 05/22/2011    [name], PTA 10/31/2019, 11:53 AM  [facility] [address], [place], [place], [address] Phone: [phone]   Fax:  [phone]  Name: [name] MRN: [id number] Date of Birth: 1945/09/04

## 2019-11-05 ENCOUNTER — Encounter: Payer: Self-pay | Admitting: Physical Therapy

## 2019-11-05 ENCOUNTER — Other Ambulatory Visit: Payer: Self-pay

## 2019-11-05 ENCOUNTER — Ambulatory Visit: Payer: Medicare Other | Admitting: Physical Therapy

## 2019-11-05 DIAGNOSIS — R262 Difficulty in walking, not elsewhere classified: Secondary | ICD-10-CM

## 2019-11-05 DIAGNOSIS — M6281 Muscle weakness (generalized): Secondary | ICD-10-CM

## 2019-11-05 DIAGNOSIS — R293 Abnormal posture: Secondary | ICD-10-CM

## 2019-11-05 NOTE — Patient Instructions (Signed)
Access Code: [email or web address] URL: [email or web address] Date: 11/05/2019 Prepared by: Almyra Free  Exercises Seated Side Bending with Arms Overhead with PLB - 1 x daily - 7 x weekly - 3 sets - 10 reps Seated Hip Flexor Stretch - 1 x daily - 7 x weekly - 3 reps - 1 sets - 30-60 sec hold Hip Flexor Stretch with Chair - 1 x daily - 7 x weekly - 1 sets - 3 reps - 30-60 hold Standing Hamstring Stretch on Chair - 1 x daily - 7 x weekly - 1 sets - 3 reps - 30-60 hold Doorway Pec Stretch at 60 Elevation - 1 x daily - 7 x weekly - 3 sets - 10 reps Supine Butterfly Groin Stretch - 1 x daily - 7 x weekly - 10 reps - 10 seconds hold Supine Piriformis Stretch with Foot on Ground - 1 x daily - 7 x weekly - 1 sets - 3 reps - 30-60 hold Sidelying Open Book Thoracic Lumbar Rotation and Extension - 1 x daily - 7 x weekly - 1 sets - 10 reps Sit to Stand - 1 x daily - 7 x weekly - 10 reps - 3 sets Standing Anti-Rotation Press with Anchored Resistance - 1 x daily - 7 x weekly - 3 sets - 10 reps Standing Row with Anchored Resistance - 1 x daily - 7 x weekly - 3 sets - 10 reps Standing Hip Abduction with Resistance at Ankles and Unilateral Counter Support - 1 x daily - 7 x weekly - 3 sets - 5 reps Standing Hip Extension with Resistance at Ankles and Counter Support - 1 x daily - 7 x weekly - 3 sets - 5 reps Bridge with Hip Abduction and Resistance - 1 x daily - 3 x weekly - 1-3 sets - 10 reps - 10 sec hold Supine Hip Abduction with Resistance at Ankles - 1 x daily - 7 x weekly - 1-3 sets - 10 reps - 5-10 sec hold Supine Shoulder Horizontal Abduction with Resistance - 1 x daily - 7 x weekly - 2 sets - 10 reps Bilateral Bent Leg Lift - 1 x daily - 7 x weekly - 3 sets - 10 reps Clamshell with Resistance - 1 x daily - 7 x weekly - 1-3 sets - 10 reps Sidelying Bent Knee Lift at 45 Degrees - 1 x daily - 7 x weekly - 1-3 sets - 10 reps Seated Long Arc Quad with Ankle Weight - 1 x daily - 7 x weekly - 1-3 sets - 10 reps  - 5 hold Seated Hip Flexion March with Ankle Weights - 1 x daily - 7 x weekly - 1-3 sets - 10 reps Seated Thoracic Extension Arms Overhead - 1 x daily - 7 x weekly - 10 reps - 1-2 sets - 2-3 sec hold Seated Diagonal Chops with Medicine Ball - 1 x daily - 7 x weekly - 10 reps - 1-2 sets - 2-3 hold Seated Shoulder Diagonal with Resistance - 1 x daily - 7 x weekly - 3 sets - 10 reps Quadricep Stretch with Chair and Counter Support - 1 x daily - 7 x weekly - 1 sets - 3 reps - 30-60 sec hold Supine Hip Abduction on Slider - 1 x daily - 7 x weekly - 3 sets - 10 reps

## 2019-11-05 NOTE — Therapy (Signed)
[facility] [address] [place], [place], [address] Phone: [phone]   Fax:  [phone]  Physical Therapy Treatment  Patient Details  Name: [name] MRN: [id number] Date of Birth: 04/06/1945 Referring Provider (PT): Rennis Harding, MD   Encounter Date: 11/05/2019   PT End of Session - 11/05/19 1108    Visit Number 35    Date for PT Re-Evaluation 11/06/19    Authorization Type Medicare/BCBS    PT Start Time 1108    PT Stop Time 1146    PT Time Calculation (min) 38 min    Activity Tolerance Patient tolerated treatment well    Behavior During Therapy [facility] for tasks assessed/performed           Past Medical History:  Diagnosis Date  . Congenital musculoskeletal deformity of spine   . Hypertension   . Incontinence of bowel 09/2013  . Lumbago   . Lumbosacral spondylosis without myelopathy   . Sleep disorder    uses gabapentin at bedtime   . Spinal stenosis, lumbar region, without neurogenic claudication     Past Surgical History:  Procedure Laterality Date  . ANKLE SURGERY Right 2015   lengthened the achilles   . AUGMENTATION MAMMAPLASTY Bilateral 2000 or 2001 unsure    Left Implant has busted and is no longer visible  . BACK SURGERY  2015   fusion , Dr Patrice Paradise at [facility]; has 2 metal rods in place , fusion from t2 to s1   . BACK SURGERY  01/2017   Dr Rennis Harding ; repair of cracked titanium rod in back lumbar   . BREAST EXCISIONAL BIOPSY Left    30 yrs ago  . bunionectomy  Bilateral 1980s   multiple   . COLONOSCOPY     with polypectomy ; q 5 years   . FOOT SURGERY  07/2011  . SHOULDER SURGERY Left 2013   rotator cuff   . TOTAL HIP ARTHROPLASTY Right 05/08/2017   Procedure: RIGHT TOTAL HIP ARTHROPLASTY ANTERIOR APPROACH;  Surgeon: Paralee Cancel, MD;  Location: [facility];  Service: Orthopedics;  Laterality: Right;  70 mins    There were no vitals filed for this visit.   Subjective  Assessment - 11/05/19 1116    Subjective I have improved but it's slow. I'm angry about the hip surgery. I still can't walk very far.    Pertinent History rods/fusion T9 to pelvis    Limitations Walking    Patient Stated Goals walking without a cane, improve posture    Currently in Pain? Yes    Pain Score 4     Pain Location Back    Pain Orientation Mid    Pain Descriptors / Indicators Aching;Sore    Pain Type Chronic pain              [facility] PT Assessment - 11/05/19 0001      Strength   Overall Strength Comments Left hip ABD/Ext 3+/5      Flexibility   Quadriceps marked bil                         [facility] Adult PT Treatment/Exercise - 11/05/19 0001      Self-Care   Self-Care Other Self-Care Comments    Other Self-Care Comments  discussed use of walking sticks or using AD to help maintain correct posture when walking.       Knee/Hip Exercises: Stretches   Architectural technologist  reps;20 seconds    Quad Stretch Limitations on chair facing chairback    Other Knee/Hip Stretches standing bil hip ADD stretch on stairs x 10 active then prolonged hold x 20 sec       Knee/Hip Exercises: Aerobic   Elliptical R1 incline 5 x 2 min   hard on quads   Nustep L4 x 7 min      Knee/Hip Exercises: Supine   Other Supine Knee/Hip Exercises left hip ABD on slider x 10                  PT Education - 11/05/19 1238    Education Details HEP finalized    Person(s) Educated Patient    Methods Explanation;Demonstration;Handout    Comprehension Verbalized understanding;Returned demonstration            PT Short Term Goals - 09/03/19 1114      PT SHORT TERM GOAL #1   Title Pt will be independent and consistent with her initial HEP to increase LE strength and posture.    Time 6    Period Weeks    Status Achieved    Target Date 07/22/18      PT SHORT TERM GOAL #2   Title Pt will be able to complete sit to stand without significant weight shift to the Lt which will  improve her efficiency with daily activity.    Time 6    Period Weeks    Status Achieved   Min weight shift            PT Long Term Goals - 11/05/19 1127      PT LONG TERM GOAL #1   Title Pt will be able to ambulate 234f without the need for AD and with adequate foot clearance, minimal trunk lean.    Baseline met except for foot clearance worsened 1540f and groin pain left    Status Partially Met      PT LONG TERM GOAL #2   Title Pt will have 4+/5 MMT strength of the LEs for improved mechanics with daily activity.    Baseline met on right; left still 3+/5 with ABD and ext    Status Not Met      PT LONG TERM GOAL #3   Title Pt will have no more than 3 inch distance from C7 to the wall when standing with her back facing the wall, to reflect improvements in her posture and endurance.    Baseline 2 and 3/4"    Status Achieved      PT LONG TERM GOAL #4   Title Pt will be able to go 4 hours without her wearing her back brace during the day.    Baseline pt not wearing brace at all    Status Achieved      PT LONG TERM GOAL #5   Title Pt will perform 5 x sit to stand in less than 13 seconds    Status Achieved                 Plan - 11/05/19 1520    Clinical Impression Statement Patient will be discharged at next visit. She has met or partially met 4/5 LTGs. She is still limited with left hip ABD and extension strength. She works hard at strengthening and is compliant with her HEP and should continue to improve functionally. We discussed using walking sticks for vacations or longer walks. She is frustrated with the limitations with ambulation. Posturally she has improved  significantly at the C/T junction. She hopes to continue aquatic strengthening after d/c.    PT Frequency 2x / week    PT Duration 8 weeks    PT Treatment/Interventions ADLs/Self Care Home Management;Aquatic Therapy;Cryotherapy;Moist Heat;Electrical Stimulation;Therapeutic activities;Therapeutic  exercise;Balance training;Gait training;Neuromuscular re-education;Manual techniques;Dry needling;Taping;Patient/family education;Joint Manipulations    PT Next Visit Plan d/c at next visit.    PT Home Exercise Plan [email or web address]           Patient will benefit from skilled therapeutic intervention in order to improve the following deficits and impairments:  Abnormal gait, Improper body mechanics, Pain, Increased muscle spasms, Decreased strength, Decreased range of motion, Decreased activity tolerance, Hypomobility, Impaired flexibility, Difficulty walking, Decreased balance, Postural dysfunction  Visit Diagnosis: Muscle weakness (generalized)  Difficulty in walking, not elsewhere classified  Abnormal posture     Problem List Patient Active Problem List   Diagnosis Date Noted  . Hip contracture, unspecified laterality 07/02/2018  . Overweight (BMI 25.0-29.9) 05/09/2017  . S/P right THA, AA 05/08/2017  . Chronic bilateral low back pain without sciatica 04/05/2015  . Disorder of sacroiliac joint 12/22/2014  . Chronic low back pain 12/22/2014  . Acquired scoliosis 11/27/2011  . Lumbosacral spondylosis without myelopathy 05/22/2011    Madelyn Flavors PT 11/05/2019, 3:33 PM  [facility] [address] [place], [place], [address] Phone: [phone]   Fax:  [phone]  Name: Giabella Duhart MRN: [id number] Date of Birth: July 08, 1945

## 2019-11-07 ENCOUNTER — Encounter: Payer: Self-pay | Admitting: Physical Therapy

## 2019-11-07 ENCOUNTER — Ambulatory Visit: Payer: Medicare Other | Admitting: Physical Therapy

## 2019-11-07 ENCOUNTER — Other Ambulatory Visit: Payer: Self-pay

## 2019-11-07 DIAGNOSIS — M6281 Muscle weakness (generalized): Secondary | ICD-10-CM | POA: Diagnosis not present

## 2019-11-07 DIAGNOSIS — M79605 Pain in left leg: Secondary | ICD-10-CM

## 2019-11-07 DIAGNOSIS — R293 Abnormal posture: Secondary | ICD-10-CM

## 2019-11-07 DIAGNOSIS — R262 Difficulty in walking, not elsewhere classified: Secondary | ICD-10-CM

## 2019-11-07 NOTE — Therapy (Addendum)
[facility] [address] [place], [place], [address] Phone: [phone]   Fax:  [phone]  Physical Therapy Treatment and Discharge Summary  Patient Details  Name: [name] MRN: [id number] Date of Birth: 25-Jan-1945 Referring Provider (PT): Rennis Harding, MD   Encounter Date: 11/07/2019   PT End of Session - 11/07/19 1515    Visit Number 36    Date for PT Re-Evaluation 11/06/19    Authorization Type Medicare/BCBS    Authorization Time Period 09/06/19 to 11/06/19    PT Start Time 1210    PT Stop Time 1255    PT Time Calculation (min) 45 min    Activity Tolerance Patient tolerated treatment well    Behavior During Therapy [facility] for tasks assessed/performed           Past Medical History:  Diagnosis Date  . Congenital musculoskeletal deformity of spine   . Hypertension   . Incontinence of bowel 09/2013  . Lumbago   . Lumbosacral spondylosis without myelopathy   . Sleep disorder    uses gabapentin at bedtime   . Spinal stenosis, lumbar region, without neurogenic claudication     Past Surgical History:  Procedure Laterality Date  . ANKLE SURGERY Right 2015   lengthened the achilles   . AUGMENTATION MAMMAPLASTY Bilateral 2000 or 2001 unsure    Left Implant has busted and is no longer visible  . BACK SURGERY  2015   fusion , Dr Patrice Paradise at [facility]; has 2 metal rods in place , fusion from t2 to s1   . BACK SURGERY  01/2017   Dr Rennis Harding ; repair of cracked titanium rod in back lumbar   . BREAST EXCISIONAL BIOPSY Left    30 yrs ago  . bunionectomy  Bilateral 1980s   multiple   . COLONOSCOPY     with polypectomy ; q 5 years   . FOOT SURGERY  07/2011  . SHOULDER SURGERY Left 2013   rotator cuff   . TOTAL HIP ARTHROPLASTY Right 05/08/2017   Procedure: RIGHT TOTAL HIP ARTHROPLASTY ANTERIOR APPROACH;  Surgeon: Paralee Cancel, MD;  Location: [facility];  Service: Orthopedics;  Laterality: Right;  70  mins    There were no vitals filed for this visit.   Subjective Assessment - 11/07/19 1514    Subjective I am sore today but not bad. I am [facility]ng [facility] [employer] next week,    Pertinent History rods/fusion T9 to pelvis    Currently in Pain? Yes    Pain Score 4     Pain Location Back    Pain Orientation Mid    Pain Descriptors / Indicators Aching;Sore    Aggravating Factors  Overdoing it    Pain Relieving Factors Rest, stretching, water exercises    Multiple Pain Sites No         Patient seen for aquatic therapy today.  Treatment took place in water 2.5-4 feet deep depending upon activity.  Pt entered the pool via stairs with heavy use of hand rails. Water temp 87.6 degrees F.  Standing in mid chest/waist depth water pt performed water walking in all 4 directions 4 lengths of pool. VC for speed in order to generate appropriate current for resistance. Blue noodle for postural assistance. Hip 3 ways 20x ea with min support of pool wall. VC to push/pull against water with a force appropriate. Core contractions in partial wall squat with blue noodle 5 sec hold 10x.  White noodle  knee extensions 10x bil, UE support required for balance.High knee marching with blue noodle 2 full lengths of pool.  Water bicycle x 6 min to end session. Pt required 2 short bouts of decompression floating to release back muscles.     [facility] PT Assessment - 11/07/19 0001      Assessment   Medical Diagnosis fusion of lumbar spine    Referring Provider (PT) Rennis Harding, MD      Strength   Overall Strength Comments Left hip ABD/Ext 3+/5      Flexibility   Quadriceps marked bil                                   PT Short Term Goals - 09/03/19 1114      PT SHORT TERM GOAL #1   Title Pt will be independent and consistent with her initial HEP to increase LE strength and posture.    Time 6    Period Weeks    Status Achieved    Target Date 07/22/18      PT SHORT TERM GOAL #2   Title Pt  will be able to complete sit to stand without significant weight shift to the Lt which will improve her efficiency with daily activity.    Time 6    Period Weeks    Status Achieved   Min weight shift            PT Long Term Goals - 11/07/19 1518      PT LONG TERM GOAL #1   Title Pt will be able to ambulate 225f without the need for AD and with adequate foot clearance, minimal trunk lean.    Baseline met except for foot clearance worsened 1539f and groin pain left    Time 12    Period Weeks    Status Partially Met      PT LONG TERM GOAL #2   Title Pt will have 4+/5 MMT strength of the LEs for improved mechanics with daily activity.    Baseline met on right; left still 3+/5 with ABD and ext    Time 12    Period Weeks    Status Not Met      PT LONG TERM GOAL #3   Title Pt will have no more than 3 inch distance from C7 to the wall when standing with her back facing the wall, to reflect improvements in her posture and endurance.    Baseline 2 and 3/4"    Time 12    Period Weeks    Status Achieved      PT LONG TERM GOAL #4   Title Pt will be able to go 4 hours without her wearing her back brace during the day.    Period Weeks    Status Achieved      PT LONG TERM GOAL #5   Title Pt will perform 5 x sit to stand in less than 13 seconds    Baseline 11 sec    Time 6    Period Weeks    Status Achieved                 Plan - 11/07/19 1516    Clinical Impression Statement Pt completed her final aquatic session. Pt is independent in her own water based HEP [facility] Pt also plans to purchase walking sticks to assit in her  walking . No increased pain while exercising in the water.    Personal Factors and Comorbidities Age;Fitness;Comorbidity 2;Past/Current Experience    Comorbidities Rt THA, T9-pelvis fusion    Examination-Activity Limitations Locomotion Level;Transfers    PT Treatment/Interventions ADLs/Self Care Home Management;Aquatic  Therapy;Cryotherapy;Moist Heat;Electrical Stimulation;Therapeutic activities;Therapeutic exercise;Balance training;Gait training;Neuromuscular re-education;Manual techniques;Dry needling;Taping;Patient/family education;Joint Manipulations    PT Next Visit Plan DC to HEp    PT Home Exercise Plan [email or web address]    Consulted and Agree with Plan of Care Patient           Patient will benefit from skilled therapeutic intervention in order to improve the following deficits and impairments:  Abnormal gait, Improper body mechanics, Pain, Increased muscle spasms, Decreased strength, Decreased range of motion, Decreased activity tolerance, Hypomobility, Impaired flexibility, Difficulty walking, Decreased balance, Postural dysfunction  Visit Diagnosis: Muscle weakness (generalized)  Difficulty in walking, not elsewhere classified  Abnormal posture  Pain in left leg     Problem List Patient Active Problem List   Diagnosis Date Noted  . Hip contracture, unspecified laterality 07/02/2018  . Overweight (BMI 25.0-29.9) 05/09/2017  . S/P right THA, AA 05/08/2017  . Chronic bilateral low back pain without sciatica 04/05/2015  . Disorder of sacroiliac joint 12/22/2014  . Chronic low back pain 12/22/2014  . Acquired scoliosis 11/27/2011  . Lumbosacral spondylosis without myelopathy 05/22/2011    [name], PTA 11/07/19 3:27 PM  [facility] [address] [place], [place], [address] Phone: [phone]   Fax:  [phone]  Name: Nalee Lightle MRN: [id number] Date of Birth: [birth year]  PHYSICAL THERAPY DISCHARGE SUMMARY  Visits from Start of Care: 36  Current functional level related to goals / functional outcomes: See above   Remaining deficits: See above   Education / Equipment: HEP Plan: Patient agrees to discharge.  Patient goals were partially met. Patient is being discharged due to being pleased with the  current functional level.  ?????    Madelyn Flavors, PT 11/10/19 8:55 AM [facility] [address], [place], [place] [address] Phone # [phone] Fax [phone]

## 2019-12-26 ENCOUNTER — Telehealth: Payer: Self-pay

## 2019-12-26 ENCOUNTER — Other Ambulatory Visit: Payer: Self-pay | Admitting: Rehabilitation

## 2019-12-26 DIAGNOSIS — M4325 Fusion of spine, thoracolumbar region: Secondary | ICD-10-CM

## 2019-12-26 NOTE — Telephone Encounter (Signed)
Phone call to patient to verify medication list and allergies for myelogram procedure. Pt instructed to hold Wellbutrin for 48hrs prior to myelogram appointment time and 24 hours after appointment. Pt states "I will have to talk with my psychiatrist prior to stopping this medication". Advised pt to contact our office if she is unable to stop this medication. Pt has the office phone number. Pt also instructed to have a driver the day of the procedure, the procedure would take around 2 hours, and discharge instructions discussed. Pt verbalized understanding.

## 2020-01-05 ENCOUNTER — Ambulatory Visit (INDEPENDENT_AMBULATORY_CARE_PROVIDER_SITE_OTHER): Payer: Medicare Other | Admitting: Orthopaedic Surgery

## 2020-01-05 ENCOUNTER — Encounter: Payer: Self-pay | Admitting: Orthopaedic Surgery

## 2020-01-05 ENCOUNTER — Ambulatory Visit (INDEPENDENT_AMBULATORY_CARE_PROVIDER_SITE_OTHER): Payer: Medicare Other

## 2020-01-05 VITALS — Ht 63.5 in | Wt 183.0 lb

## 2020-01-05 DIAGNOSIS — M2170 Unequal limb length (acquired), unspecified site: Secondary | ICD-10-CM | POA: Diagnosis not present

## 2020-01-05 DIAGNOSIS — M25561 Pain in right knee: Secondary | ICD-10-CM | POA: Diagnosis not present

## 2020-01-05 DIAGNOSIS — M25552 Pain in left hip: Secondary | ICD-10-CM

## 2020-01-05 DIAGNOSIS — Z96641 Presence of right artificial hip joint: Secondary | ICD-10-CM | POA: Diagnosis not present

## 2020-01-05 DIAGNOSIS — G8929 Other chronic pain: Secondary | ICD-10-CM

## 2020-01-05 NOTE — Progress Notes (Signed)
Office Visit Note   Patient: Suzanne EmmsHeather Smet           Date of Birth: [birth year]           MRN: [id number] Visit Date: 01/05/2020              Requested by: Shirlean MylarWebb, Carol, MD [address] [place],  [place] [id number] PCP: Shirlean MylarWebb, Carol, MD   Assessment & Plan: Visit Diagnoses:  1. Pain in left hip   2. History of total right hip replacement   3. Chronic pain of right knee   4. Leg length discrepancy     Plan: Based on my exam and findings, I would not recommend a left hip replacement.  We would not be able to make her shoulder to have her leg lengths equal.  Also, based on just mild arthritis and my clinical exam, I do not think she needs a left hip replacement at all.  I did review my colleagues operative note and her hip ball is a 36+1.5 on the right total hip side.  One recommendation for her to consider would be a revision surgery would just increase in her length going up to a +5 versus a +8.5 hip ball length we can get her back out to length and will improve her balance and coordination.  I would at least also put her in a hinged knee brace for her right knee today.  Her right knee exam is otherwise normal but her balance is off and off that her knee tends to turn in on occasion.  She said this would allow her to take NSAID I would like to see her back in just 2 weeks to discuss this further.  She agrees with this treatment plan.  All questions and concerns were answered and addressed.  Follow-Up Instructions: Return in about 2 weeks (around 01/19/2020).   Orders:  Orders Placed This Encounter  Procedures  . XR HIP UNILAT W OR W/O PELVIS 2-3 VIEWS LEFT   No orders of the defined types were placed in this encounter.     Procedures: No procedures performed   Clinical Data: No additional findings.   Subjective: Chief Complaint  Patient presents with  . Left Hip - Pain  The patient comes in today with a complicated history as it relates to hip pain.  She  has had significant lumbar spine surgery and has significant lumbar spine scoliosis.  She comes to me for evaluation treatment of left hip pain but it sounds like her right hip is what her issue has been.  She actually had a right hip arthroplasty by one of my colleagues in town in 2019.  She has been very upset in terms of her leg length discrepancy with her right side actually much shorter than the left side.  She also reports that her right knee tends to turn in.  She has had some left hip and groin pain and had a steroid injection under fluoroscopy of her left hip and that helped some.  It is more of her issue with her balance and coordination that is bothering her more than anything.  She is tried a shoe lift on her right side and needs a significant 1 to balance her back out.  She is not a diabetic.  HPI  Review of Systems Again she has had a significant spine surgery in the past.  There is currently listed headache, chest pain, shortness of breath, fever, chills, nausea, vomiting  Objective: Vital Signs: Ht 5' 3.5" (1.613 m)   Wt 183 lb (83 kg)   BMI 31.91 kg/m   Physical Exam She does ambulate using a cane.  There is definitely a slight Trendelenburg gait.  She is alert and orient x3 and in no acute distress Ortho Exam Examination of her left hip today is basically normal.  It moves smoothly and fluidly with no pain in the groin.  Her right operative hip also moves smoothly and fluidly and she has a well-healed right hip incision.  When I have her lay in a supine position there is definitely a leg length discrepancy with her right operative side significantly shorter than the left. Specialty Comments:  No specialty comments available.  Imaging: No results found.   PMFS History: Patient Active Problem List   Diagnosis Date Noted  . Hip contracture, unspecified laterality 07/02/2018  . Overweight (BMI 25.0-29.9) 05/09/2017  . S/P right THA, AA 05/08/2017  . Chronic bilateral low  back pain without sciatica 04/05/2015  . Disorder of sacroiliac joint 12/22/2014  . Chronic low back pain 12/22/2014  . Acquired scoliosis 11/27/2011  . Lumbosacral spondylosis without myelopathy 05/22/2011   Past Medical History:  Diagnosis Date  . Congenital musculoskeletal deformity of spine   . Hypertension   . Incontinence of bowel 09/2013  . Lumbago   . Lumbosacral spondylosis without myelopathy   . Sleep disorder    uses gabapentin at bedtime   . Spinal stenosis, lumbar region, without neurogenic claudication     Family History  Problem Relation Age of Onset  . Dementia Mother   . Stroke Mother   . Depression Mother   . COPD Father   . Cancer Brother   . Breast cancer Neg Hx     Past Surgical History:  Procedure Laterality Date  . ANKLE SURGERY Right 2015   lengthened the achilles   . AUGMENTATION MAMMAPLASTY Bilateral 2000 or 2001 unsure    Left Implant has busted and is no longer visible  . BACK SURGERY  2015   fusion , Dr Noel Gerold at [facility]; has 2 metal rods in place , fusion from t2 to s1   . BACK SURGERY  01/2017   Dr Sharolyn Douglas ; repair of cracked titanium rod in back lumbar   . BREAST EXCISIONAL BIOPSY Left    30 yrs ago  . bunionectomy  Bilateral 1980s   multiple   . COLONOSCOPY     with polypectomy ; q 5 years   . FOOT SURGERY  07/2011  . SHOULDER SURGERY Left 2013   rotator cuff   . TOTAL HIP ARTHROPLASTY Right 05/08/2017   Procedure: RIGHT TOTAL HIP ARTHROPLASTY ANTERIOR APPROACH;  Surgeon: Durene Romans, MD;  Location: [facility];  Service: Orthopedics;  Laterality: Right;  70 mins   Social History   Occupational History  . Not on file  Tobacco Use  . Smoking status: Former Games developer  . Smokeless tobacco: Never Used  . Tobacco comment: smoked for ayear and half in college   Vaping Use  . Vaping Use: Never used  Substance and Sexual Activity  . Alcohol use: Yes    Alcohol/week: 3.0 standard drinks    Types: 3 Glasses of wine per week  .  Drug use: Not on file  . Sexual activity: Not on file

## 2020-01-07 ENCOUNTER — Ambulatory Visit
Admission: RE | Admit: 2020-01-07 | Discharge: 2020-01-07 | Disposition: A | Discharge status: Home / Self Care | Payer: Medicare Other | Source: Ambulatory Visit | Attending: Rehabilitation | Admitting: Rehabilitation

## 2020-01-07 ENCOUNTER — Other Ambulatory Visit: Payer: Self-pay

## 2020-01-07 DIAGNOSIS — M4325 Fusion of spine, thoracolumbar region: Secondary | ICD-10-CM

## 2020-01-07 MED ORDER — ONDANSETRON HCL 4 MG/2ML IJ SOLN
4.0000 mg | Freq: Once | INTRAMUSCULAR | Status: AC
  Administered 2020-01-07: 4 mg via INTRAMUSCULAR

## 2020-01-07 MED ORDER — MEPERIDINE HCL 50 MG/ML IJ SOLN
50.0000 mg | Freq: Once | INTRAMUSCULAR | Status: AC
  Administered 2020-01-07: 50 mg via INTRAMUSCULAR

## 2020-01-07 MED ORDER — DIAZEPAM 5 MG PO TABS
5.0000 mg | ORAL_TABLET | Freq: Once | ORAL | Status: AC
  Administered 2020-01-07: 5 mg via ORAL

## 2020-01-07 MED ORDER — IOPAMIDOL (ISOVUE-M 300) INJECTION 61%
10.0000 mL | Freq: Once | INTRAMUSCULAR | Status: AC | PRN
  Administered 2020-01-07: 10 mL via INTRATHECAL

## 2020-01-07 NOTE — Discharge Instr - Other Orders (Signed)
Pain 8/10 in lower back.  See MAR.

## 2020-01-07 NOTE — Progress Notes (Signed)
Pt reports she has been off of her Wellbutrin for at least 48 hours.

## 2020-01-07 NOTE — Discharge Instructions (Signed)
Myelogram Discharge Instructions  1. Go home and rest quietly for the next 24 hours.  It is important to lie flat for the next 24 hours.  Get up only to go to the restroom.  You may lie in the bed or on a couch on your back, your stomach, your left side or your right side.  You may have one pillow under your head.  You may have pillows between your knees while you are on your side or under your knees while you are on your back.  2. DO NOT drive today.  Recline the seat as far back as it will go, while still wearing your seat belt, on the way home.  3. You may get up to go to the bathroom as needed.  You may sit up for 10 minutes to eat.  You may resume your normal diet and medications unless otherwise indicated.  Drink lots of extra fluids today and tomorrow.  4. The incidence of headache, nausea, or vomiting is about 5% (one in 20 patients).  If you develop a headache, lie flat and drink plenty of fluids until the headache goes away.  Caffeinated beverages may be helpful.  If you develop severe nausea and vomiting or a headache that does not go away with flat bed rest, call [phone].  5. You may resume normal activities after your 24 hours of bed rest is over; however, do not exert yourself strongly or do any heavy lifting tomorrow. If when you get up you have a headache when standing, go back to bed and force fluids for another 24 hours.  6. Call your physician for a follow-up appointment.  The results of your myelogram will be sent directly to your physician by the following day.  7. If you have any questions or if complications develop after you arrive home, please call [phone].  Discharge instructions have been explained to the patient.  The patient, or the person responsible for the patient, fully understands these instructions  YOU MAY TAKE YOUR WELLBUTRIN AT 11AM TOMORROW ON 01/08/20

## 2020-01-21 ENCOUNTER — Ambulatory Visit (INDEPENDENT_AMBULATORY_CARE_PROVIDER_SITE_OTHER): Payer: Medicare Other | Admitting: Orthopaedic Surgery

## 2020-01-21 ENCOUNTER — Other Ambulatory Visit: Payer: Self-pay

## 2020-01-21 ENCOUNTER — Encounter: Payer: Self-pay | Admitting: Orthopaedic Surgery

## 2020-01-21 DIAGNOSIS — M2170 Unequal limb length (acquired), unspecified site: Secondary | ICD-10-CM | POA: Diagnosis not present

## 2020-01-21 DIAGNOSIS — R269 Unspecified abnormalities of gait and mobility: Secondary | ICD-10-CM

## 2020-01-21 DIAGNOSIS — Z96641 Presence of right artificial hip joint: Secondary | ICD-10-CM | POA: Diagnosis not present

## 2020-01-21 NOTE — Progress Notes (Signed)
The patient is being seen in follow-up.  She is someone with a leg length discrepancy with her right total hip arthroplasty shorter than the left side.  She has scoliosis as well and her right hip and knee want to turn in.  She does have a shoe lift on the right side.  Her hip replacement was done by one of my colleagues in town.  She does ambulate using a cane as well.  I talked her about the possibility of a hip ball and polyliner exchange at some point to lengthen her to get her back out to length which could help with her balance and coordination in general.  Her right knee and hip seem to move normally.  When she stands, her right knee wants to balance.  I do feel that she would benefit from outpatient physical therapy to work on her gait and balance and coordination with concentrating on any modalities that can keep her from standing the way that she does and walking the way that she does.  At some point she will consider's improving her leg lengths with a head ball and liner exchange.  She agrees to outpatient physical therapy.  I can see her back in about 3 months to see how she is doing overall.  She agrees with this treatment plan is well.

## 2020-02-02 DIAGNOSIS — F4321 Adjustment disorder with depressed mood: Secondary | ICD-10-CM | POA: Diagnosis not present

## 2020-02-05 DIAGNOSIS — R269 Unspecified abnormalities of gait and mobility: Secondary | ICD-10-CM | POA: Diagnosis not present

## 2020-02-05 DIAGNOSIS — M47814 Spondylosis without myelopathy or radiculopathy, thoracic region: Secondary | ICD-10-CM | POA: Diagnosis not present

## 2020-02-05 DIAGNOSIS — M40209 Unspecified kyphosis, site unspecified: Secondary | ICD-10-CM | POA: Diagnosis not present

## 2020-02-05 DIAGNOSIS — M4325 Fusion of spine, thoracolumbar region: Secondary | ICD-10-CM | POA: Diagnosis not present

## 2020-02-06 DIAGNOSIS — M4325 Fusion of spine, thoracolumbar region: Secondary | ICD-10-CM | POA: Diagnosis not present

## 2020-02-06 DIAGNOSIS — R2689 Other abnormalities of gait and mobility: Secondary | ICD-10-CM | POA: Diagnosis not present

## 2020-02-10 ENCOUNTER — Other Ambulatory Visit: Payer: Self-pay | Admitting: Family Medicine

## 2020-02-10 DIAGNOSIS — R2689 Other abnormalities of gait and mobility: Secondary | ICD-10-CM | POA: Diagnosis not present

## 2020-02-10 DIAGNOSIS — M4325 Fusion of spine, thoracolumbar region: Secondary | ICD-10-CM | POA: Diagnosis not present

## 2020-02-10 DIAGNOSIS — E041 Nontoxic single thyroid nodule: Secondary | ICD-10-CM

## 2020-02-13 DIAGNOSIS — M4325 Fusion of spine, thoracolumbar region: Secondary | ICD-10-CM | POA: Diagnosis not present

## 2020-02-13 DIAGNOSIS — R2689 Other abnormalities of gait and mobility: Secondary | ICD-10-CM | POA: Diagnosis not present

## 2020-02-16 DIAGNOSIS — F331 Major depressive disorder, recurrent, moderate: Secondary | ICD-10-CM | POA: Diagnosis not present

## 2020-02-16 DIAGNOSIS — F4321 Adjustment disorder with depressed mood: Secondary | ICD-10-CM | POA: Diagnosis not present

## 2020-02-17 DIAGNOSIS — R2689 Other abnormalities of gait and mobility: Secondary | ICD-10-CM | POA: Diagnosis not present

## 2020-02-17 DIAGNOSIS — M4325 Fusion of spine, thoracolumbar region: Secondary | ICD-10-CM | POA: Diagnosis not present

## 2020-02-20 ENCOUNTER — Ambulatory Visit
Admission: RE | Admit: 2020-02-20 | Discharge: 2020-02-20 | Disposition: A | Discharge status: Home / Self Care | Payer: Medicare Other | Source: Ambulatory Visit | Attending: Family Medicine | Admitting: Family Medicine

## 2020-02-20 DIAGNOSIS — R2689 Other abnormalities of gait and mobility: Secondary | ICD-10-CM | POA: Diagnosis not present

## 2020-02-20 DIAGNOSIS — M4325 Fusion of spine, thoracolumbar region: Secondary | ICD-10-CM | POA: Diagnosis not present

## 2020-02-20 DIAGNOSIS — E041 Nontoxic single thyroid nodule: Secondary | ICD-10-CM | POA: Diagnosis not present

## 2020-02-22 DIAGNOSIS — Z1152 Encounter for screening for COVID-19: Secondary | ICD-10-CM | POA: Diagnosis not present

## 2020-02-24 DIAGNOSIS — R2689 Other abnormalities of gait and mobility: Secondary | ICD-10-CM | POA: Diagnosis not present

## 2020-02-24 DIAGNOSIS — M4325 Fusion of spine, thoracolumbar region: Secondary | ICD-10-CM | POA: Diagnosis not present

## 2020-02-27 DIAGNOSIS — R2689 Other abnormalities of gait and mobility: Secondary | ICD-10-CM | POA: Diagnosis not present

## 2020-02-27 DIAGNOSIS — M4325 Fusion of spine, thoracolumbar region: Secondary | ICD-10-CM | POA: Diagnosis not present

## 2020-03-02 DIAGNOSIS — R2689 Other abnormalities of gait and mobility: Secondary | ICD-10-CM | POA: Diagnosis not present

## 2020-03-02 DIAGNOSIS — M4325 Fusion of spine, thoracolumbar region: Secondary | ICD-10-CM | POA: Diagnosis not present

## 2020-03-03 DIAGNOSIS — F419 Anxiety disorder, unspecified: Secondary | ICD-10-CM | POA: Diagnosis not present

## 2020-03-03 DIAGNOSIS — F331 Major depressive disorder, recurrent, moderate: Secondary | ICD-10-CM | POA: Diagnosis not present

## 2020-03-05 DIAGNOSIS — M4325 Fusion of spine, thoracolumbar region: Secondary | ICD-10-CM | POA: Diagnosis not present

## 2020-03-05 DIAGNOSIS — R2689 Other abnormalities of gait and mobility: Secondary | ICD-10-CM | POA: Diagnosis not present

## 2020-03-09 DIAGNOSIS — M4325 Fusion of spine, thoracolumbar region: Secondary | ICD-10-CM | POA: Diagnosis not present

## 2020-03-09 DIAGNOSIS — R2689 Other abnormalities of gait and mobility: Secondary | ICD-10-CM | POA: Diagnosis not present

## 2020-03-12 DIAGNOSIS — M4325 Fusion of spine, thoracolumbar region: Secondary | ICD-10-CM | POA: Diagnosis not present

## 2020-03-12 DIAGNOSIS — R2689 Other abnormalities of gait and mobility: Secondary | ICD-10-CM | POA: Diagnosis not present

## 2020-03-15 DIAGNOSIS — F331 Major depressive disorder, recurrent, moderate: Secondary | ICD-10-CM | POA: Diagnosis not present

## 2020-03-16 DIAGNOSIS — M4325 Fusion of spine, thoracolumbar region: Secondary | ICD-10-CM | POA: Diagnosis not present

## 2020-03-16 DIAGNOSIS — F419 Anxiety disorder, unspecified: Secondary | ICD-10-CM | POA: Diagnosis not present

## 2020-03-16 DIAGNOSIS — R2689 Other abnormalities of gait and mobility: Secondary | ICD-10-CM | POA: Diagnosis not present

## 2020-03-16 DIAGNOSIS — F331 Major depressive disorder, recurrent, moderate: Secondary | ICD-10-CM | POA: Diagnosis not present

## 2020-03-19 DIAGNOSIS — R2689 Other abnormalities of gait and mobility: Secondary | ICD-10-CM | POA: Diagnosis not present

## 2020-03-19 DIAGNOSIS — M4325 Fusion of spine, thoracolumbar region: Secondary | ICD-10-CM | POA: Diagnosis not present

## 2020-03-22 DIAGNOSIS — R2689 Other abnormalities of gait and mobility: Secondary | ICD-10-CM | POA: Diagnosis not present

## 2020-03-22 DIAGNOSIS — M4325 Fusion of spine, thoracolumbar region: Secondary | ICD-10-CM | POA: Diagnosis not present

## 2020-03-26 DIAGNOSIS — R2689 Other abnormalities of gait and mobility: Secondary | ICD-10-CM | POA: Diagnosis not present

## 2020-03-26 DIAGNOSIS — M4325 Fusion of spine, thoracolumbar region: Secondary | ICD-10-CM | POA: Diagnosis not present

## 2020-03-29 DIAGNOSIS — R2689 Other abnormalities of gait and mobility: Secondary | ICD-10-CM | POA: Diagnosis not present

## 2020-03-29 DIAGNOSIS — M4325 Fusion of spine, thoracolumbar region: Secondary | ICD-10-CM | POA: Diagnosis not present

## 2020-04-01 DIAGNOSIS — M4325 Fusion of spine, thoracolumbar region: Secondary | ICD-10-CM | POA: Diagnosis not present

## 2020-04-01 DIAGNOSIS — R2689 Other abnormalities of gait and mobility: Secondary | ICD-10-CM | POA: Diagnosis not present

## 2020-04-05 DIAGNOSIS — R2689 Other abnormalities of gait and mobility: Secondary | ICD-10-CM | POA: Diagnosis not present

## 2020-04-05 DIAGNOSIS — M40209 Unspecified kyphosis, site unspecified: Secondary | ICD-10-CM | POA: Diagnosis not present

## 2020-04-05 DIAGNOSIS — F331 Major depressive disorder, recurrent, moderate: Secondary | ICD-10-CM | POA: Diagnosis not present

## 2020-04-05 DIAGNOSIS — F419 Anxiety disorder, unspecified: Secondary | ICD-10-CM | POA: Diagnosis not present

## 2020-04-05 DIAGNOSIS — M4325 Fusion of spine, thoracolumbar region: Secondary | ICD-10-CM | POA: Diagnosis not present

## 2020-04-07 ENCOUNTER — Ambulatory Visit: Payer: Medicare Other | Admitting: Endocrinology

## 2020-04-07 DIAGNOSIS — H35033 Hypertensive retinopathy, bilateral: Secondary | ICD-10-CM | POA: Diagnosis not present

## 2020-04-07 DIAGNOSIS — H5203 Hypermetropia, bilateral: Secondary | ICD-10-CM | POA: Diagnosis not present

## 2020-04-07 DIAGNOSIS — D3132 Benign neoplasm of left choroid: Secondary | ICD-10-CM | POA: Diagnosis not present

## 2020-04-07 DIAGNOSIS — H2513 Age-related nuclear cataract, bilateral: Secondary | ICD-10-CM | POA: Diagnosis not present

## 2020-04-07 DIAGNOSIS — H52223 Regular astigmatism, bilateral: Secondary | ICD-10-CM | POA: Diagnosis not present

## 2020-04-07 DIAGNOSIS — H524 Presbyopia: Secondary | ICD-10-CM | POA: Diagnosis not present

## 2020-04-11 DIAGNOSIS — Z23 Encounter for immunization: Secondary | ICD-10-CM | POA: Diagnosis not present

## 2020-04-12 DIAGNOSIS — R2689 Other abnormalities of gait and mobility: Secondary | ICD-10-CM | POA: Diagnosis not present

## 2020-04-12 DIAGNOSIS — M4325 Fusion of spine, thoracolumbar region: Secondary | ICD-10-CM | POA: Diagnosis not present

## 2020-04-15 DIAGNOSIS — M4325 Fusion of spine, thoracolumbar region: Secondary | ICD-10-CM | POA: Diagnosis not present

## 2020-04-15 DIAGNOSIS — R2689 Other abnormalities of gait and mobility: Secondary | ICD-10-CM | POA: Diagnosis not present

## 2020-04-19 DIAGNOSIS — R2689 Other abnormalities of gait and mobility: Secondary | ICD-10-CM | POA: Diagnosis not present

## 2020-04-19 DIAGNOSIS — M4325 Fusion of spine, thoracolumbar region: Secondary | ICD-10-CM | POA: Diagnosis not present

## 2020-04-20 DIAGNOSIS — F419 Anxiety disorder, unspecified: Secondary | ICD-10-CM | POA: Diagnosis not present

## 2020-04-20 DIAGNOSIS — F331 Major depressive disorder, recurrent, moderate: Secondary | ICD-10-CM | POA: Diagnosis not present

## 2020-04-22 DIAGNOSIS — M4325 Fusion of spine, thoracolumbar region: Secondary | ICD-10-CM | POA: Diagnosis not present

## 2020-04-22 DIAGNOSIS — R2689 Other abnormalities of gait and mobility: Secondary | ICD-10-CM | POA: Diagnosis not present

## 2020-04-26 DIAGNOSIS — R2689 Other abnormalities of gait and mobility: Secondary | ICD-10-CM | POA: Diagnosis not present

## 2020-04-26 DIAGNOSIS — M4325 Fusion of spine, thoracolumbar region: Secondary | ICD-10-CM | POA: Diagnosis not present

## 2020-05-03 DIAGNOSIS — R2689 Other abnormalities of gait and mobility: Secondary | ICD-10-CM | POA: Diagnosis not present

## 2020-05-03 DIAGNOSIS — M4325 Fusion of spine, thoracolumbar region: Secondary | ICD-10-CM | POA: Diagnosis not present

## 2020-05-06 DIAGNOSIS — R2689 Other abnormalities of gait and mobility: Secondary | ICD-10-CM | POA: Diagnosis not present

## 2020-05-06 DIAGNOSIS — M4325 Fusion of spine, thoracolumbar region: Secondary | ICD-10-CM | POA: Diagnosis not present

## 2020-05-06 DIAGNOSIS — F331 Major depressive disorder, recurrent, moderate: Secondary | ICD-10-CM | POA: Diagnosis not present

## 2020-05-06 DIAGNOSIS — F419 Anxiety disorder, unspecified: Secondary | ICD-10-CM | POA: Diagnosis not present

## 2020-05-10 DIAGNOSIS — M4325 Fusion of spine, thoracolumbar region: Secondary | ICD-10-CM | POA: Diagnosis not present

## 2020-05-10 DIAGNOSIS — R2689 Other abnormalities of gait and mobility: Secondary | ICD-10-CM | POA: Diagnosis not present

## 2020-05-13 DIAGNOSIS — R2689 Other abnormalities of gait and mobility: Secondary | ICD-10-CM | POA: Diagnosis not present

## 2020-05-13 DIAGNOSIS — M4325 Fusion of spine, thoracolumbar region: Secondary | ICD-10-CM | POA: Diagnosis not present

## 2020-05-18 ENCOUNTER — Ambulatory Visit (INDEPENDENT_AMBULATORY_CARE_PROVIDER_SITE_OTHER): Payer: Medicare Other | Admitting: Endocrinology

## 2020-05-18 ENCOUNTER — Encounter: Payer: Self-pay | Admitting: Endocrinology

## 2020-05-18 ENCOUNTER — Other Ambulatory Visit: Payer: Self-pay

## 2020-05-18 DIAGNOSIS — M4325 Fusion of spine, thoracolumbar region: Secondary | ICD-10-CM | POA: Diagnosis not present

## 2020-05-18 DIAGNOSIS — R2689 Other abnormalities of gait and mobility: Secondary | ICD-10-CM | POA: Diagnosis not present

## 2020-05-18 DIAGNOSIS — E042 Nontoxic multinodular goiter: Secondary | ICD-10-CM | POA: Insufficient documentation

## 2020-05-18 LAB — T4, FREE: Free T4: 0.85 ng/dL (ref 0.60–1.60)

## 2020-05-18 LAB — TSH: TSH: 1.47 u[IU]/mL (ref 0.35–4.50)

## 2020-05-18 NOTE — Patient Instructions (Addendum)
Blood tests are requested for you today.  We'll let you know about the results.   If the thyroid is not overactive, please come soon for the biopsy.  It is easy.

## 2020-05-18 NOTE — Progress Notes (Signed)
Subjective:    Patient ID: [name], female    DOB: 1945/05/30, 75 y.o.   MRN: [id number]  HPI Pt is referred by Dr Justin Mend, for nodular thyroid.  Pt was noted to have MNG in 2009.  she has no h/o XRT or surgery to the neck.   Past Medical History:  Diagnosis Date  . Congenital musculoskeletal deformity of spine   . Hypertension   . Incontinence of bowel 09/2013  . Lumbago   . Lumbosacral spondylosis without myelopathy   . Sleep disorder    uses gabapentin at bedtime   . Spinal stenosis, lumbar region, without neurogenic claudication     Past Surgical History:  Procedure Laterality Date  . ANKLE SURGERY Right 2015   lengthened the achilles   . AUGMENTATION MAMMAPLASTY Bilateral 2000 or 2001 unsure    Left Implant has busted and is no longer visible  . BACK SURGERY  2015   fusion , Dr Patrice Paradise at [facility]; has 2 metal rods in place , fusion from t2 to s1   . BACK SURGERY  01/2017   Dr Rennis Harding ; repair of cracked titanium rod in back lumbar   . BREAST EXCISIONAL BIOPSY Left    30 yrs ago  . bunionectomy  Bilateral 1980s   multiple   . COLONOSCOPY     with polypectomy ; q 5 years   . FOOT SURGERY  07/2011  . SHOULDER SURGERY Left 2013   rotator cuff   . TOTAL HIP ARTHROPLASTY Right 05/08/2017   Procedure: RIGHT TOTAL HIP ARTHROPLASTY ANTERIOR APPROACH;  Surgeon: Paralee Cancel, MD;  Location: [facility];  Service: Orthopedics;  Laterality: Right;  70 mins    Social History   Socioeconomic History  . Marital status: Married    Spouse name: Not on file  . Number of children: Not on file  . Years of education: Not on file  . Highest education level: Not on file  Occupational History  . Not on file  Tobacco Use  . Smoking status: Former Research scientist (life sciences)  . Smokeless tobacco: Never Used  . Tobacco comment: smoked for ayear and half in college   Vaping Use  . Vaping Use: Never used  Substance and Sexual Activity  . Alcohol use: Yes    Alcohol/week: 3.0 standard  drinks    Types: 3 Glasses of wine per week  . Drug use: Not on file  . Sexual activity: Not on file  Other Topics Concern  . Not on file  Social History Narrative  . Not on file   Social Determinants of Health   Financial Resource Strain: Not on file  Food Insecurity: Not on file  Transportation Needs: Not on file  Physical Activity: Not on file  Stress: Not on file  Social Connections: Not on file  Intimate Partner Violence: Not on file    Current Outpatient Medications on File Prior to Visit  Medication Sig Dispense Refill  . buPROPion (WELLBUTRIN XL) 150 MG 24 hr tablet Take 300 mg by mouth daily.     Marland Kitchen docusate sodium (COLACE) 100 MG capsule Take 1 capsule (100 mg total) by mouth 2 (two) times daily. 10 capsule 0  . gabapentin (NEURONTIN) 600 MG tablet Take 1 tablet (600 mg total) by mouth daily. 3 tabs QHS 3 tablet 1  . methocarbamol (ROBAXIN) 750 MG tablet Take 750 mg by mouth 3 (three) times daily. As needed for pain    . rosuvastatin (CRESTOR) 20 MG tablet  Take 20 mg by mouth at bedtime.     . temazepam (RESTORIL) 15 MG capsule Take 15 mg by mouth at bedtime as needed for sleep.    . Vitamin D, Ergocalciferol, (DRISDOL) 50000 UNITS CAPS capsule Take 50,000 Units by mouth every Thursday.     . calcium carbonate (OS-CAL - DOSED IN MG OF ELEMENTAL CALCIUM) 1250 (500 Ca) MG tablet Take 1 tablet by mouth daily. (Patient not taking: Reported on 12/26/2019)    . clonazePAM (KLONOPIN) 0.5 MG tablet Take 1 mg by mouth at bedtime.  (Patient not taking: Reported on 12/26/2019)  3  . ibuprofen (ADVIL) 800 MG tablet Take 800 mg by mouth every 8 (eight) hours as needed.    Marland Kitchen losartan (COZAAR) 100 MG tablet Take 25 mg by mouth daily. Patient reports that she now takes 1/2 of tablet, or 50mg  once a day ; reports her PCP Dr Inda Merlin is aware of this    . Menthol, Topical Analgesic, (BIOFREEZE EX) Apply 1 application topically daily as needed (knee pain).    . polyethylene glycol (MIRALAX /  GLYCOLAX) packet Take 17 g by mouth 2 (two) times daily. 14 each 0   No current facility-administered medications on file prior to visit.    Allergies  Allergen Reactions  . Lisinopril     cough    Family History  Problem Relation Age of Onset  . Dementia Mother   . Stroke Mother   . Depression Mother   . Thyroid disease Mother   . COPD Father   . Cancer Brother   . Breast cancer Neg Hx     BP 130/84 (BP Location: Right Arm, Patient Position: Sitting, Cuff Size: Normal)   Pulse 84   Ht 5' 3.5" (1.613 m)   Wt 183 lb 6.4 oz (83.2 kg)   SpO2 94%   BMI 31.98 kg/m   Review of Systems Denies hoarseness, neck pain, and sob.       Objective:   Physical Exam VITAL SIGNS:  See vs page GENERAL: no distress NECK: 3 cm right thyroid nodule is easily palpable (I cannot palpate the left nodule).   outside test results are reviewed: I cannot find that pt has had TFT  I have reviewed outside records, and summarized: Pt was noted to have thyroid nodule, and referred here. No nodule was palpable at that time, but h/o MNG was noted     Assessment & Plan:  Thyroid nodule, new to me, uncertain etiology and prognosis.   Patient Instructions  Blood tests are requested for you today.  We'll let you know about the results.   If the thyroid is not overactive, please come soon for the biopsy.  It is easy.

## 2020-05-19 ENCOUNTER — Ambulatory Visit (INDEPENDENT_AMBULATORY_CARE_PROVIDER_SITE_OTHER): Payer: Medicare Other | Admitting: Orthopaedic Surgery

## 2020-05-19 ENCOUNTER — Encounter: Payer: Self-pay | Admitting: Orthopaedic Surgery

## 2020-05-19 DIAGNOSIS — R269 Unspecified abnormalities of gait and mobility: Secondary | ICD-10-CM

## 2020-05-19 DIAGNOSIS — Z96641 Presence of right artificial hip joint: Secondary | ICD-10-CM

## 2020-05-19 DIAGNOSIS — M2170 Unequal limb length (acquired), unspecified site: Secondary | ICD-10-CM | POA: Diagnosis not present

## 2020-05-19 NOTE — Progress Notes (Signed)
The patient is a very pleasant 75 year old female that I seen before.  She has a history of a right total hip arthroplasty done to an anterior approach by one of my colleagues 3 years ago.  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  This is also complicated by her scoliosis.  Since I have seen her I sent her to physical therapy which is helped quite a bit with her balance and coordination.  She still ambulates with a cane and has a significant shoe buildup on her right side.  We did talk in detail about hip revision surgery with hopefully increasing her leg length with a polyliner exchange and a new hip ball.  I was able to read her operative note and get a good idea that we could lengthen her.  We had a long and thorough discussion about this.  She is frustrated with given her balance and coordination due to this likely discrepancy and I think this is a reasonable option.  She understands that with surgery there is certainly risk of acute blood loss anemia and her vessel injury as well as fracture, dislocation, implant failure and skin and soft tissue issues.  She understands her goals are improving her balance and her leg lengths which can help with her coordination and degree of her pain as well.  She has had no other acute change in her medical status.  At this point she is interested in surgery and I agree with this as well given failure of conservative treatment.  We will work on getting this scheduled at her convenience.

## 2020-05-21 DIAGNOSIS — M4325 Fusion of spine, thoracolumbar region: Secondary | ICD-10-CM | POA: Diagnosis not present

## 2020-05-21 DIAGNOSIS — R2689 Other abnormalities of gait and mobility: Secondary | ICD-10-CM | POA: Diagnosis not present

## 2020-05-24 ENCOUNTER — Encounter: Payer: Self-pay | Admitting: Endocrinology

## 2020-06-04 ENCOUNTER — Telehealth: Payer: Self-pay

## 2020-06-04 DIAGNOSIS — M25552 Pain in left hip: Secondary | ICD-10-CM

## 2020-06-04 NOTE — Telephone Encounter (Signed)
Is this ok?

## 2020-06-04 NOTE — Telephone Encounter (Signed)
Patient is requesting to continue P.T. at [facility] until she is able to have surgery in July.

## 2020-06-04 NOTE — Telephone Encounter (Signed)
That will be fine. 

## 2020-06-08 NOTE — Addendum Note (Signed)
Addended by: Robyne Peers on: 06/08/2020 08:31 AM   Modules accepted: Orders

## 2020-06-08 NOTE — Telephone Encounter (Signed)
Therapy order placed in chart  

## 2020-06-08 NOTE — Telephone Encounter (Signed)
Lvm informing pt.

## 2020-06-14 DIAGNOSIS — F419 Anxiety disorder, unspecified: Secondary | ICD-10-CM | POA: Diagnosis not present

## 2020-06-14 DIAGNOSIS — F331 Major depressive disorder, recurrent, moderate: Secondary | ICD-10-CM | POA: Diagnosis not present

## 2020-07-06 DIAGNOSIS — R2689 Other abnormalities of gait and mobility: Secondary | ICD-10-CM | POA: Diagnosis not present

## 2020-07-06 DIAGNOSIS — M4325 Fusion of spine, thoracolumbar region: Secondary | ICD-10-CM | POA: Diagnosis not present

## 2020-07-07 DIAGNOSIS — F331 Major depressive disorder, recurrent, moderate: Secondary | ICD-10-CM | POA: Diagnosis not present

## 2020-07-07 DIAGNOSIS — F419 Anxiety disorder, unspecified: Secondary | ICD-10-CM | POA: Diagnosis not present

## 2020-07-09 DIAGNOSIS — G459 Transient cerebral ischemic attack, unspecified: Secondary | ICD-10-CM | POA: Diagnosis not present

## 2020-07-13 ENCOUNTER — Other Ambulatory Visit (HOSPITAL_COMMUNITY)
Admission: RE | Admit: 2020-07-13 | Discharge: 2020-07-13 | Disposition: A | Discharge status: Home / Self Care | Payer: Medicare Other | Source: Ambulatory Visit | Attending: Endocrinology | Admitting: Endocrinology

## 2020-07-13 ENCOUNTER — Other Ambulatory Visit: Payer: Self-pay

## 2020-07-13 ENCOUNTER — Ambulatory Visit (INDEPENDENT_AMBULATORY_CARE_PROVIDER_SITE_OTHER): Payer: Medicare Other | Admitting: Endocrinology

## 2020-07-13 VITALS — BP 130/82 | HR 73 | Ht 63.5 in | Wt 184.2 lb

## 2020-07-13 DIAGNOSIS — D34 Benign neoplasm of thyroid gland: Secondary | ICD-10-CM | POA: Insufficient documentation

## 2020-07-13 DIAGNOSIS — E042 Nontoxic multinodular goiter: Secondary | ICD-10-CM | POA: Diagnosis not present

## 2020-07-13 DIAGNOSIS — E041 Nontoxic single thyroid nodule: Secondary | ICD-10-CM | POA: Diagnosis present

## 2020-07-13 NOTE — Progress Notes (Signed)
   Subjective:    Patient ID: [name], female    DOB: [birth year], 75 y.o.   MRN: [id number]  HPI Procedure only   Review of Systems     Objective:   Physical Exam   Lab Results  Component Value Date   TSH 1.47 05/18/2020   thyroid needle bx: Location: thyroid consent obtained, signed form on chart The area is first sprayed with cooling agent local: xylocaine 2%, with epinephrine prep: alcohol pad 4 bxs are done with 25 and 27g needles We are out of slide holder vials, so al material is put into vial with no slides made here no complications     Assessment & Plan:

## 2020-07-13 NOTE — Patient Instructions (Addendum)
We'll let you know about the biopsy results.  If no cancer is found, Please come back for a follow-up appointment in 8 months.

## 2020-07-14 ENCOUNTER — Other Ambulatory Visit: Payer: Self-pay | Admitting: Family Medicine

## 2020-07-14 ENCOUNTER — Other Ambulatory Visit (HOSPITAL_COMMUNITY): Payer: Self-pay | Admitting: Family Medicine

## 2020-07-14 DIAGNOSIS — G459 Transient cerebral ischemic attack, unspecified: Secondary | ICD-10-CM

## 2020-07-14 DIAGNOSIS — M4325 Fusion of spine, thoracolumbar region: Secondary | ICD-10-CM | POA: Diagnosis not present

## 2020-07-14 DIAGNOSIS — R2689 Other abnormalities of gait and mobility: Secondary | ICD-10-CM | POA: Diagnosis not present

## 2020-07-15 ENCOUNTER — Other Ambulatory Visit: Payer: Self-pay | Admitting: Family Medicine

## 2020-07-15 ENCOUNTER — Other Ambulatory Visit: Payer: Self-pay

## 2020-07-15 ENCOUNTER — Encounter: Payer: Self-pay | Admitting: Endocrinology

## 2020-07-15 ENCOUNTER — Ambulatory Visit (HOSPITAL_COMMUNITY): Payer: Medicare Other | Attending: Cardiovascular Disease

## 2020-07-15 DIAGNOSIS — R4701 Aphasia: Secondary | ICD-10-CM

## 2020-07-15 DIAGNOSIS — G459 Transient cerebral ischemic attack, unspecified: Secondary | ICD-10-CM | POA: Insufficient documentation

## 2020-07-15 LAB — ECHOCARDIOGRAM COMPLETE
Area-P 1/2: 4.01 cm2
S' Lateral: 2.9 cm

## 2020-07-15 LAB — CYTOLOGY - NON PAP

## 2020-07-19 DIAGNOSIS — M4325 Fusion of spine, thoracolumbar region: Secondary | ICD-10-CM | POA: Diagnosis not present

## 2020-07-19 DIAGNOSIS — R2689 Other abnormalities of gait and mobility: Secondary | ICD-10-CM | POA: Diagnosis not present

## 2020-07-21 ENCOUNTER — Ambulatory Visit
Admission: RE | Admit: 2020-07-21 | Discharge: 2020-07-21 | Disposition: A | Discharge status: Home / Self Care | Payer: Medicare Other | Source: Ambulatory Visit | Attending: Family Medicine | Admitting: Family Medicine

## 2020-07-21 DIAGNOSIS — R410 Disorientation, unspecified: Secondary | ICD-10-CM | POA: Diagnosis not present

## 2020-07-21 DIAGNOSIS — G319 Degenerative disease of nervous system, unspecified: Secondary | ICD-10-CM | POA: Diagnosis not present

## 2020-07-21 DIAGNOSIS — R2689 Other abnormalities of gait and mobility: Secondary | ICD-10-CM | POA: Diagnosis not present

## 2020-07-21 DIAGNOSIS — R4701 Aphasia: Secondary | ICD-10-CM

## 2020-07-21 DIAGNOSIS — M4325 Fusion of spine, thoracolumbar region: Secondary | ICD-10-CM | POA: Diagnosis not present

## 2020-07-23 ENCOUNTER — Other Ambulatory Visit: Payer: Self-pay | Admitting: Physician Assistant

## 2020-07-26 DIAGNOSIS — R2689 Other abnormalities of gait and mobility: Secondary | ICD-10-CM | POA: Diagnosis not present

## 2020-07-26 DIAGNOSIS — M4325 Fusion of spine, thoracolumbar region: Secondary | ICD-10-CM | POA: Diagnosis not present

## 2020-07-27 ENCOUNTER — Ambulatory Visit
Admission: RE | Admit: 2020-07-27 | Discharge: 2020-07-27 | Disposition: A | Discharge status: Home / Self Care | Payer: Medicare Other | Source: Ambulatory Visit | Attending: Family Medicine | Admitting: Family Medicine

## 2020-07-27 DIAGNOSIS — E782 Mixed hyperlipidemia: Secondary | ICD-10-CM | POA: Diagnosis not present

## 2020-07-27 DIAGNOSIS — R479 Unspecified speech disturbances: Secondary | ICD-10-CM | POA: Diagnosis not present

## 2020-07-27 DIAGNOSIS — I6523 Occlusion and stenosis of bilateral carotid arteries: Secondary | ICD-10-CM | POA: Diagnosis not present

## 2020-07-27 DIAGNOSIS — G459 Transient cerebral ischemic attack, unspecified: Secondary | ICD-10-CM

## 2020-07-27 DIAGNOSIS — I10 Essential (primary) hypertension: Secondary | ICD-10-CM | POA: Diagnosis not present

## 2020-07-29 NOTE — Progress Notes (Signed)
Surgical Instructions    Your procedure is scheduled on Tuesday, July 26th, 2022 .  Report to [facility] Main Entrance "A" at 10:15 A.M., then check in with the Admitting office.  Call this number if you have problems the morning of surgery:  [phone]   If you have any questions prior to your surgery date call [phone]: Open Monday-Friday 8am-4pm    Remember:  Do not eat after midnight the night before your surgery  You may drink clear liquids until 09:15 the morning of your surgery.   Clear liquids allowed are: Water, Non-Citrus Juices (without pulp), Carbonated Beverages, Clear Tea, Black Coffee Only, and Gatorade  Patient Instructions  The night before surgery:  No food after midnight. ONLY clear liquids after midnight  The day of surgery (if you do NOT have diabetes):  Drink ONE (1) Pre-Surgery Clear Ensure by 09:15 the morning of surgery. Drink in one sitting. Do not sip.  This drink was given to you during your hospital  pre-op appointment visit.  Nothing else to drink after completing the  Pre-Surgery Clear Ensure.          If you have questions, please contact your surgeon's office.     Take these medicines the morning of surgery with A SIP OF WATER:  buPROPion (WELLBUTRIN XL)   If needed:  methocarbamol (ROBAXIN)  As of today, STOP taking any Aspirin (unless otherwise instructed by your surgeon) Aleve, Naproxen, Ibuprofen, Motrin, Advil, Goody's, BC's, all herbal medications, fish oil, and all vitamins.          Do not wear jewelry or makeup Do not wear lotions, powders, perfumes, or deodorant. Do not shave 48 hours prior to surgery.   Do not bring valuables to the hospital. DO Not wear nail polish, gel polish, artificial nails, or any other type of covering on natural nails including finger and toenails. If patients have artificial nails, gel coating, etc. that need to be removed by a nail salon please have this removed prior to surgery or surgery  may need to be canceled/delayed if the surgeon/ anesthesia feels like the patient is unable to be adequately monitored.             [facility] is not responsible for any belongings or valuables.  Do NOT Smoke (Tobacco/Vaping) or drink Alcohol 24 hours prior to your procedure If you use a CPAP at night, you may bring all equipment for your overnight stay.   Contacts, glasses, dentures or bridgework may not be worn into surgery, please bring cases for these belongings   For patients admitted to the hospital, discharge time will be determined by your treatment team.   Patients discharged the day of surgery will not be allowed to drive home, and someone needs to stay with them for 24 hours.  ONLY 1 SUPPORT PERSON MAY BE PRESENT WHILE YOU ARE IN SURGERY. IF YOU ARE TO BE ADMITTED ONCE YOU ARE IN YOUR ROOM YOU WILL BE ALLOWED TWO (2) VISITORS.  Minor children may have two parents present. Special consideration for safety and communication needs will be reviewed on a case by case basis.  Special instructions:    Oral Hygiene is also important to reduce your risk of infection.  Remember - BRUSH YOUR TEETH THE MORNING OF SURGERY WITH YOUR REGULAR TOOTHPASTE   - Preparing For Surgery  Before surgery, you can play an important role. Because skin is not sterile, your skin needs to be as free of germs as  possible. You can reduce the number of germs on your skin by washing with CHG (chlorahexidine gluconate) Soap before surgery.  CHG is an antiseptic cleaner which kills germs and bonds with the skin to continue killing germs even after washing.     Please do not use if you have an allergy to CHG or antibacterial soaps. If your skin becomes reddened/irritated stop using the CHG.  Do not shave (including legs and underarms) for at least 48 hours prior to first CHG shower. It is OK to shave your face.  Please follow these instructions carefully.     Shower the NIGHT BEFORE SURGERY and the  MORNING OF SURGERY with CHG Soap.   If you chose to wash your hair, wash your hair first as usual with your normal shampoo. After you shampoo, rinse your hair and body thoroughly to remove the shampoo.  Then [employer] and genitals (private parts) with your normal soap and rinse thoroughly to remove soap.  After that Use CHG Soap as you would any other liquid soap. You can apply CHG directly to the skin and wash gently with a scrungie or a clean washcloth.   Apply the CHG Soap to your body ONLY FROM THE NECK DOWN.  Do not use on open wounds or open sores. Avoid contact with your eyes, ears, mouth and genitals (private parts). Wash Face and genitals (private parts)  with your normal soap.   Wash thoroughly, paying special attention to the area where your surgery will be performed.  Thoroughly rinse your body with warm water from the neck down.  DO NOT shower/wash with your normal soap after using and rinsing off the CHG Soap.  Pat yourself dry with a CLEAN TOWEL.  Wear CLEAN PAJAMAS to bed the night before surgery  Place CLEAN SHEETS on your bed the night before your surgery  DO NOT SLEEP WITH PETS.   Day of Surgery:  Take a shower with CHG soap. Wear Clean/Comfortable clothing the morning of surgery Do not apply any deodorants/lotions.   Remember to brush your teeth WITH YOUR REGULAR TOOTHPASTE.   Please read over the following fact sheets that you were given.

## 2020-07-30 ENCOUNTER — Other Ambulatory Visit: Payer: Self-pay

## 2020-07-30 ENCOUNTER — Encounter (HOSPITAL_COMMUNITY)
Admission: RE | Admit: 2020-07-30 | Discharge: 2020-07-30 | Disposition: A | Discharge status: Home / Self Care | Payer: Medicare Other | Source: Ambulatory Visit | Attending: Orthopaedic Surgery | Admitting: Orthopaedic Surgery

## 2020-07-30 ENCOUNTER — Encounter (HOSPITAL_COMMUNITY): Payer: Self-pay

## 2020-07-30 DIAGNOSIS — R2689 Other abnormalities of gait and mobility: Secondary | ICD-10-CM | POA: Diagnosis not present

## 2020-07-30 DIAGNOSIS — Z01812 Encounter for preprocedural laboratory examination: Secondary | ICD-10-CM | POA: Diagnosis not present

## 2020-07-30 DIAGNOSIS — Z20822 Contact with and (suspected) exposure to covid-19: Secondary | ICD-10-CM | POA: Insufficient documentation

## 2020-07-30 DIAGNOSIS — M4325 Fusion of spine, thoracolumbar region: Secondary | ICD-10-CM | POA: Diagnosis not present

## 2020-07-30 HISTORY — DX: Depression, unspecified: F32.A

## 2020-07-30 HISTORY — DX: Sleep apnea, unspecified: G47.30

## 2020-07-30 HISTORY — DX: Anemia, unspecified: D64.9

## 2020-07-30 LAB — COMPREHENSIVE METABOLIC PANEL
ALT: 19 U/L (ref 0–44)
AST: 21 U/L (ref 15–41)
Albumin: 4 g/dL (ref 3.5–5.0)
Alkaline Phosphatase: 74 U/L (ref 38–126)
Anion gap: 9 (ref 5–15)
BUN: 7 mg/dL — ABNORMAL LOW (ref 8–23)
CO2: 23 mmol/L (ref 22–32)
Calcium: 9.3 mg/dL (ref 8.9–10.3)
Chloride: 105 mmol/L (ref 98–111)
Creatinine, Ser: 0.94 mg/dL (ref 0.44–1.00)
GFR, Estimated: >60 mL/min (ref >60)
Glucose, Bld: 112 mg/dL — ABNORMAL HIGH (ref 70–99)
Potassium: 3.8 mmol/L (ref 3.5–5.1)
Sodium: 137 mmol/L (ref 135–145)
Total Bilirubin: 0.8 mg/dL (ref 0.3–1.2)
Total Protein: 6.6 g/dL (ref 6.5–8.1)

## 2020-07-30 LAB — TYPE AND SCREEN
ABO/RH(D): O NEG
Antibody Screen: NEGATIVE

## 2020-07-30 LAB — SURGICAL PCR SCREEN
MRSA, PCR: NEGATIVE
Staphylococcus aureus: NEGATIVE

## 2020-07-30 LAB — CBC
HCT: 45.5 % (ref 36.0–46.0)
Hemoglobin: 14.8 g/dL (ref 12.0–15.0)
MCH: 29.7 pg (ref 26.0–34.0)
MCHC: 32.5 g/dL (ref 30.0–36.0)
MCV: 91.2 fL (ref 80.0–100.0)
Platelets: 244 10*3/uL (ref 150–400)
RBC: 4.99 MIL/uL (ref 3.87–5.11)
RDW: 13.1 % (ref 11.5–15.5)
WBC: 6.4 10*3/uL (ref 4.0–10.5)
nRBC: 0 % (ref 0.0–0.2)

## 2020-07-30 LAB — SARS CORONAVIRUS 2 (TAT 6-24 HRS): SARS Coronavirus 2: NEGATIVE

## 2020-07-30 NOTE — Progress Notes (Signed)
PCP: Maurice Small, MD Cardiologist: denies  EKG: 07/30/20 CXR: na ECHO: 07/15/20 (to rule out stroke per patient. .  It was negative) Stress Test: denies Cardiac Cath: denies  Fasting Blood Sugar- na Checks Blood Sugar__na_ times a day  OSA/CPAP: No  ASA/Blood Thinner: No  Covid test 07/30/20 at PAT  Anesthesia Review: Yes, abnormal EKG  Patient denies shortness of breath, fever, cough, and chest pain at PAT appointment.  Patient verbalized understanding of instructions provided today at the PAT appointment.  Patient asked to review instructions at home and day of surgery.

## 2020-08-02 ENCOUNTER — Encounter (HOSPITAL_COMMUNITY): Payer: Self-pay | Admitting: Physician Assistant

## 2020-08-02 ENCOUNTER — Encounter (HOSPITAL_COMMUNITY): Payer: Self-pay | Admitting: Certified Registered"

## 2020-08-03 ENCOUNTER — Encounter (HOSPITAL_COMMUNITY): Admission: RE | Payer: Self-pay | Source: Home / Self Care

## 2020-08-03 ENCOUNTER — Inpatient Hospital Stay (HOSPITAL_COMMUNITY): Admission: RE | Admit: 2020-08-03 | Payer: Medicare Other | Source: Home / Self Care | Admitting: Orthopaedic Surgery

## 2020-08-03 SURGERY — REVISION, TOTAL ARTHROPLASTY, HIP, ANTERIOR APPROACH
Anesthesia: Choice | Site: Hip | Laterality: Right

## 2020-08-03 NOTE — Anesthesia Preprocedure Evaluation (Deleted)
Anesthesia Evaluation    Reviewed: Allergy & Precautions, Patient's Chart, lab work & pertinent test results  History of Anesthesia Complications Negative for: history of anesthetic complications  Airway        Dental   Pulmonary sleep apnea , former smoker,           Cardiovascular hypertension, Pt. on medications    '22 Carotid US - Bilateral carotid bifurcation plaque resulting in less than 50% diameter ICA stenosis.  '22 TTE - EF 55 to 60%. No valvulopathy  .    Neuro/Psych PSYCHIATRIC DISORDERS Depression TIA   GI/Hepatic negative GI ROS, Neg liver ROS,   Endo/Other   Obesity   Renal/GU negative Renal ROS     Musculoskeletal  (+) Arthritis ,  Scoliosis Hx multiple back surgeries    Abdominal   Peds  Hematology negative hematology ROS (+)   Anesthesia Other Findings Covid test negative   Reproductive/Obstetrics                            Anesthesia Physical Anesthesia Plan  ASA:   Anesthesia Plan:    Post-op Pain Management:    Induction:   PONV Risk Score and Plan:   Airway Management Planned:   Additional Equipment:   Intra-op Plan:   Post-operative Plan:   Informed Consent:   Plan Discussed with:   Anesthesia Plan Comments: (Case postponed after discussion with surgeon in setting of recent TIA without consultation from neurology/cardiology )        Anesthesia Quick Evaluation

## 2020-08-04 ENCOUNTER — Telehealth: Payer: Self-pay | Admitting: Orthopaedic Surgery

## 2020-08-04 NOTE — Telephone Encounter (Signed)
Please advise 

## 2020-08-04 NOTE — Telephone Encounter (Signed)
I called and talked to the pt. She wanted to know if she just needs a medical clearance from her pcp or do you also want CVM and neuro clearances as well. Please advise

## 2020-08-04 NOTE — Telephone Encounter (Signed)
Pt calling asking what the next steps are since her surg was cancelled yesterday. Pt still wants to go through with the surg but wants to know what Dr. Ninfa Linden wants her to follow up to do on her end. The best call back number is [phone].

## 2020-08-05 NOTE — Telephone Encounter (Signed)
Please see message from dr. Ninfa Linden about clearance

## 2020-08-06 NOTE — Telephone Encounter (Signed)
I called patient.  She stated PCP, Dr. Justin Mend, ordered tests for TIA/CVA.  I advised I would fax medical clearance request to Dr.Webb.

## 2020-08-10 DIAGNOSIS — F419 Anxiety disorder, unspecified: Secondary | ICD-10-CM | POA: Diagnosis not present

## 2020-08-10 DIAGNOSIS — F331 Major depressive disorder, recurrent, moderate: Secondary | ICD-10-CM | POA: Diagnosis not present

## 2020-08-11 DIAGNOSIS — M4325 Fusion of spine, thoracolumbar region: Secondary | ICD-10-CM | POA: Diagnosis not present

## 2020-08-11 DIAGNOSIS — Z6829 Body mass index (BMI) 29.0-29.9, adult: Secondary | ICD-10-CM | POA: Diagnosis not present

## 2020-08-11 DIAGNOSIS — M40209 Unspecified kyphosis, site unspecified: Secondary | ICD-10-CM | POA: Diagnosis not present

## 2020-08-17 ENCOUNTER — Encounter: Payer: Medicare Other | Admitting: Orthopaedic Surgery

## 2020-08-24 DIAGNOSIS — F331 Major depressive disorder, recurrent, moderate: Secondary | ICD-10-CM | POA: Diagnosis not present

## 2020-08-26 NOTE — Progress Notes (Signed)
Surgical Instructions    Your procedure is scheduled on 09/16/20.  Report to [facility] Main Entrance "A" at 5:30 A.M., then check in with the Admitting office.  Call this number if you have problems the morning of surgery:  [phone]   If you have any questions prior to your surgery date call [phone]: Open Monday-Friday 8am-4pm    Remember:  Do not eat after midnight the night before your surgery  You may drink clear liquids until 4:30 the morning of your surgery.   Clear liquids allowed are: Water, Non-Citrus Juices (without pulp), Carbonated Beverages, Clear Tea, Black Coffee Only, and Gatorade    Take these medicines the morning of surgery with A SIP OF WATER: buPROPion (WELLBUTRIN XL)  methocarbamol (ROBAXIN) - as needed    As of today, STOP taking any Aspirin (unless otherwise instructed by your surgeon) Aleve, Naproxen, Ibuprofen, Motrin, Advil, Goody's, BC's, all herbal medications, fish oil, and all vitamins.          Do not wear jewelry or makeup Do not wear lotions, powders, perfumes or deodorant. Do not shave 48 hours prior to surgery.   Do not bring valuables to the hospital. DO Not wear nail polish, gel polish, artificial nails, or any other type of covering on  natural nails including finger and toenails. If patients have artificial nails, gel coating, etc. that need to be removed by a nail salon please have this removed prior to surgery or surgery may need to be canceled/delayed if the surgeon/ anesthesia feels like the patient is unable to be adequately monitored.             [employer] is not responsible for any belongings or valuables.  Do NOT Smoke (Tobacco/Vaping) or drink Alcohol 24 hours prior to your procedure If you use a CPAP at night, you may bring all equipment for your overnight stay.   Contacts, glasses, dentures or bridgework may not be worn into surgery, please bring cases for these belongings   For patients admitted to the hospital,  discharge time will be determined by your treatment team.   Patients discharged the day of surgery will not be allowed to drive home, and someone needs to stay with them for 24 hours.  ONLY 1 SUPPORT PERSON MAY BE PRESENT WHILE YOU ARE IN SURGERY. IF YOU ARE TO BE ADMITTED ONCE YOU ARE IN YOUR ROOM YOU WILL BE ALLOWED TWO (2) VISITORS.  Minor children may have two parents present. Special consideration for safety and communication needs will be reviewed on a case by case basis.  Special instructions:    Oral Hygiene is also important to reduce your risk of infection.  Remember - BRUSH YOUR TEETH THE MORNING OF SURGERY WITH YOUR REGULAR TOOTHPASTE   [facility]- Preparing For Surgery  Before surgery, you can play an important role. Because skin is not sterile, your skin needs to be as free of germs as possible. You can reduce the number of germs on your skin by washing with CHG (chlorahexidine gluconate) Soap before surgery.  CHG is an antiseptic cleaner which kills germs and bonds with the skin to continue killing germs even after washing.     Please do not use if you have an allergy to CHG or antibacterial soaps. If your skin becomes reddened/irritated stop using the CHG.  Do not shave (including legs and underarms) for at least 48 hours prior to first CHG shower. It is OK to shave your face.  Please follow these  instructions carefully.     Shower the NIGHT BEFORE SURGERY and the MORNING OF SURGERY with CHG Soap.   If you chose to wash your hair, wash your hair first as usual with your normal shampoo. After you shampoo, rinse your hair and body thoroughly to remove the shampoo.  Then [employer] and genitals (private parts) with your normal soap and rinse thoroughly to remove soap.  After that Use CHG Soap as you would any other liquid soap. You can apply CHG directly to the skin and wash gently with a scrungie or a clean washcloth.   Apply the CHG Soap to your body ONLY FROM THE NECK DOWN.   Do not use on open wounds or open sores. Avoid contact with your eyes, ears, mouth and genitals (private parts). Wash Face and genitals (private parts)  with your normal soap.   Wash thoroughly, paying special attention to the area where your surgery will be performed.  Thoroughly rinse your body with warm water from the neck down.  DO NOT shower/wash with your normal soap after using and rinsing off the CHG Soap.  Pat yourself dry with a CLEAN TOWEL.  Wear CLEAN PAJAMAS to bed the night before surgery  Place CLEAN SHEETS on your bed the night before your surgery  DO NOT SLEEP WITH PETS.   Day of Surgery:  Take a shower with CHG soap. Wear Clean/Comfortable clothing the morning of surgery Do not apply any deodorants/lotions.   Remember to brush your teeth WITH YOUR REGULAR TOOTHPASTE.   Please read over the following fact sheets that you were given.

## 2020-08-27 ENCOUNTER — Other Ambulatory Visit: Payer: Self-pay

## 2020-08-27 ENCOUNTER — Encounter (HOSPITAL_COMMUNITY)
Admission: RE | Admit: 2020-08-27 | Discharge: 2020-08-27 | Disposition: A | Discharge status: Home / Self Care | Payer: Medicare Other | Source: Ambulatory Visit | Attending: Orthopaedic Surgery | Admitting: Orthopaedic Surgery

## 2020-08-27 ENCOUNTER — Encounter (HOSPITAL_COMMUNITY): Payer: Self-pay | Admitting: Physician Assistant

## 2020-08-27 ENCOUNTER — Encounter (HOSPITAL_COMMUNITY): Payer: Self-pay

## 2020-08-27 DIAGNOSIS — Z01812 Encounter for preprocedural laboratory examination: Secondary | ICD-10-CM | POA: Diagnosis not present

## 2020-08-27 LAB — COMPREHENSIVE METABOLIC PANEL
ALT: 18 U/L (ref 0–44)
AST: 18 U/L (ref 15–41)
Albumin: 3.9 g/dL (ref 3.5–5.0)
Alkaline Phosphatase: 76 U/L (ref 38–126)
Anion gap: 5 (ref 5–15)
BUN: 14 mg/dL (ref 8–23)
CO2: 29 mmol/L (ref 22–32)
Calcium: 9 mg/dL (ref 8.9–10.3)
Chloride: 105 mmol/L (ref 98–111)
Creatinine, Ser: 0.77 mg/dL (ref 0.44–1.00)
GFR, Estimated: >60 mL/min (ref >60)
Glucose, Bld: 100 mg/dL — ABNORMAL HIGH (ref 70–99)
Potassium: 4 mmol/L (ref 3.5–5.1)
Sodium: 139 mmol/L (ref 135–145)
Total Bilirubin: 0.7 mg/dL (ref 0.3–1.2)
Total Protein: 6.4 g/dL — ABNORMAL LOW (ref 6.5–8.1)

## 2020-08-27 LAB — SURGICAL PCR SCREEN
MRSA, PCR: NEGATIVE
Staphylococcus aureus: NEGATIVE

## 2020-08-27 NOTE — Progress Notes (Signed)
PCP: Maurice Small, MD Cardiologist: denies  EKG:07/30/20 CXR: na ECHO: 7/7/2 Stress Test: denies Cardiac Cath: denies  Fasting Blood Sugar- na Checks Blood Sugar__na_ times a day  OSA/CPAP: Yes, does not use cpap  ASA/Blood Thinner: No  Covid test 9/5.  Gave instructions, map and requisition to patient.   Anesthesia Review: Yes, sx was cancelled 7/26 due to needing clearance for recent stroke work up.  Left message for Sherri at Dr. Trevor Mace office for clearance.  Patient denies shortness of breath, fever, cough, and chest pain at PAT appointment.  Patient verbalized understanding of instructions provided today at the PAT appointment.  Patient asked to review instructions at home and day of surgery.

## 2020-08-27 NOTE — Progress Notes (Signed)
Spoke to [facility] at Dr. Trevor Mace office.  She has just spoke to [employer] regarding clearance and sending him what we need.  I spoke to [place] earlier this morning about this .

## 2020-08-30 ENCOUNTER — Telehealth: Payer: Self-pay | Admitting: Orthopaedic Surgery

## 2020-08-30 DIAGNOSIS — F331 Major depressive disorder, recurrent, moderate: Secondary | ICD-10-CM | POA: Diagnosis not present

## 2020-08-30 DIAGNOSIS — F419 Anxiety disorder, unspecified: Secondary | ICD-10-CM | POA: Diagnosis not present

## 2020-08-30 NOTE — Telephone Encounter (Signed)
Pt needing a call back from Dr. Ninfa Linden or nurse. Pt was supposed to have surg on Sept 8th but pt went in for pre op and having lots of issus and has no idea what to do. This is the second time the surg has been cancelled. Pt states Anesthesiology wanting a consult but pt got it completed and cleared previously by her PCP but has cancelled surg a second time. This is the second time its been cancelled and she has no idea where to go from here. Pt unsure if she needs to talk with the nurse or the surg sch. The best call back number is [phone].

## 2020-09-01 NOTE — Telephone Encounter (Signed)
Patient called back.  We discussed that anesthesiologist maintains that she must see/be evaluated by neurologist and cleared of having had CVA/TIA before surgery.  She is very upset that her PCP, Dr. Jason Nest, clearance was not sufficient since she has already had testing done.  I told her I would put in urgent referral to neurologist.

## 2020-09-01 NOTE — Telephone Encounter (Signed)
I called patient and left message with husband for return call.

## 2020-09-02 NOTE — Progress Notes (Signed)
NEUROLOGY CONSULTATION NOTE  [name] MRN: [id number] DOB: Nov 30, 1945  Referring provider: Jean Rosenthal, MD Primary care provider: Maurice Small, MD  Reason for consult:  TIA - surgical clearance  Assessment/Plan:   I do not think that she had a TIA.  It appears that she mistakenly said Tuesday instead of Wednesday perhaps because she was preoccupied or not completely listening (she said that she was feeling anxious).  However, she did not exhibit any lateralizing symptoms such as aphasia, dysarthria, confusion, facial droop, unilateral numbness or weakness.  Stroke workup unremarkable.  Therefore, she is cleared for surgery from a neurologic perspective.   Subjective:  [name] is a 75 year old right-handed female with HTN, sleep apnea, depression who presents for TIA.  History supplemented by referring provider's notes.  On 07/09/2020, she was on the phone having a conversation with her daughter.  She was feeling anxious.  Her daughter was going to go away on a trip.  Her daughter told her that she was leaving on Wednesday.  However, during the conversation the patient kept saying Tuesday instead of Wednesday.  She was concerned that she may have had a TIA.  No slurred speech, facial droop, unilateral numbness or weakness.  She was not confused.  She told her PCP who performed a stroke workup.  MRI of brain without contrast ordered by her PCP was performed on 07/21/2020 which was negative for acute stroke.  Carotid ultrasound on 07/27/2020 showed no hemodynamically significant stenosis.    She has gait difficulty due to discrepancy in leg length.  She has history of right total hip arthroplasty and needs a revision to hopefully increase her leg length.  However, they request neurology clearance before going forward.   PAST MEDICAL HISTORY: Past Medical History:  Diagnosis Date   Anemia    Congenital musculoskeletal deformity of spine    Depression    Hypertension     Incontinence of bowel 09/2013   Lumbago    Lumbosacral spondylosis without myelopathy    Sleep apnea    Sleep disorder    uses gabapentin at bedtime    Spinal stenosis, lumbar region, without neurogenic claudication     PAST SURGICAL HISTORY: Past Surgical History:  Procedure Laterality Date   ANKLE SURGERY Right 2015   lengthened the achilles    AUGMENTATION MAMMAPLASTY Bilateral 2000 or 2001 unsure    Left Implant has busted and is no longer visible   BACK SURGERY  2015   fusion , Dr Patrice Paradise at [facility]; has 2 metal rods in place , fusion from t2 to s1    BACK SURGERY  01/2017   Dr Rennis Harding ; repair of cracked titanium rod in back lumbar    BREAST EXCISIONAL BIOPSY Left    30 yrs ago   bunionectomy  Bilateral 1980s   multiple    COLONOSCOPY     with polypectomy ; q 5 years    FOOT SURGERY  07/2011   SHOULDER SURGERY Left 2013   rotator cuff    TOTAL HIP ARTHROPLASTY Right 05/08/2017   Procedure: RIGHT TOTAL HIP ARTHROPLASTY ANTERIOR APPROACH;  Surgeon: Paralee Cancel, MD;  Location: [facility]S;  Service: Orthopedics;  Laterality: Right;  70 mins    MEDICATIONS: Current Outpatient Medications on File Prior to Visit  Medication Sig Dispense Refill   buPROPion (WELLBUTRIN XL) 150 MG 24 hr tablet Take 450 mg by mouth daily.     docusate sodium (COLACE) 50 MG  capsule Take 50 mg by mouth daily.     gabapentin (NEURONTIN) 600 MG tablet Take 1 tablet (600 mg total) by mouth daily. 3 tabs QHS (Patient taking differently: Take 1,200 mg by mouth at bedtime.) 3 tablet 1   losartan (COZAAR) 50 MG tablet Take 50 mg by mouth daily.     Menthol, Topical Analgesic, (BIOFREEZE) 10 % LIQD Apply 1 application topically daily as needed (pain).     methocarbamol (ROBAXIN) 750 MG tablet Take 750 mg by mouth 3 (three) times daily as needed for muscle spasms.     naproxen sodium (ALEVE) 220 MG tablet Take 440 mg by mouth daily as needed (pain).     rosuvastatin (CRESTOR) 20 MG tablet Take  20 mg by mouth at bedtime.      Vitamin D, Ergocalciferol, (DRISDOL) 50000 UNITS CAPS capsule Take 50,000 Units by mouth every Thursday.      No current facility-administered medications on file prior to visit.    ALLERGIES: Allergies  Allergen Reactions   Lisinopril Cough    FAMILY HISTORY: Family History  Problem Relation Age of Onset   Dementia Mother    Stroke Mother    Depression Mother    Thyroid disease Mother    COPD Father    Cancer Brother    Breast cancer Neg Hx     Objective:  Blood pressure 119/81, pulse 77, resp. rate 20, height '5\' 5"'$  (1.651 m), weight 184 lb (83.5 kg), SpO2 97 %. General: No acute distress.  Patient appears well-groomed.   Head:  Normocephalic/atraumatic Eyes:  fundi examined but not visualized Neck: supple, no paraspinal tenderness, full range of motion Back: No paraspinal tenderness Heart: regular rate and rhythm Lungs: Clear to auscultation bilaterally. Vascular: No carotid bruits. Neurological Exam: Mental status: alert and oriented to person, place, and time, recent and remote memory intact, fund of knowledge intact, attention and concentration intact, speech fluent and not dysarthric, language intact. Cranial nerves: CN I: not tested CN II: pupils equal, round and reactive to light, visual fields intact CN III, IV, VI:  full range of motion, no nystagmus, no ptosis CN V: facial sensation intact. CN VII: upper and lower face symmetric CN VIII: hearing intact CN IX, X: gag intact, uvula midline CN XI: sternocleidomastoid and trapezius muscles intact CN XII: tongue midline Bulk & Tone: normal, no fasciculations. Motor:  muscle strength 4+/5 right hip flexion, otherwise 5/5 throughout Sensation:  Pinprick and vibratory sensation reduced in right lower extremity Deep Tendon Reflexes:  2+ throughout,  toes downgoing.   Finger to nose testing:  Without dysmetria.   Heel to shin:  Without dysmetria.   Gait:  Right limp.  Uses can to  ambulate.  Romberg negative.    Thank you for allowing me to take part in the care of this patient.  Metta Clines, DO  CC:  Maurice Small, MD  Jean Rosenthal, MD

## 2020-09-03 ENCOUNTER — Encounter: Payer: Self-pay | Admitting: Neurology

## 2020-09-03 ENCOUNTER — Ambulatory Visit (INDEPENDENT_AMBULATORY_CARE_PROVIDER_SITE_OTHER): Payer: Medicare Other | Admitting: Neurology

## 2020-09-03 ENCOUNTER — Other Ambulatory Visit: Payer: Self-pay

## 2020-09-03 VITALS — BP 119/81 | HR 77 | Resp 20 | Ht 65.0 in | Wt 184.0 lb

## 2020-09-03 DIAGNOSIS — R4789 Other speech disturbances: Secondary | ICD-10-CM | POA: Diagnosis not present

## 2020-09-03 NOTE — Patient Instructions (Signed)
I don't think you had a TIA.  From a neurologic standpoint, you are cleared for surgery.

## 2020-09-07 DIAGNOSIS — F419 Anxiety disorder, unspecified: Secondary | ICD-10-CM | POA: Diagnosis not present

## 2020-09-07 DIAGNOSIS — F331 Major depressive disorder, recurrent, moderate: Secondary | ICD-10-CM | POA: Diagnosis not present

## 2020-09-16 ENCOUNTER — Inpatient Hospital Stay: Admit: 2020-09-16 | Payer: Medicare Other | Admitting: Orthopaedic Surgery

## 2020-09-16 SURGERY — REVISION, TOTAL ARTHROPLASTY, HIP, ANTERIOR APPROACH
Anesthesia: Choice | Site: Hip | Laterality: Right

## 2020-09-24 ENCOUNTER — Other Ambulatory Visit: Payer: Self-pay | Admitting: Family Medicine

## 2020-09-24 DIAGNOSIS — Z1231 Encounter for screening mammogram for malignant neoplasm of breast: Secondary | ICD-10-CM

## 2020-09-30 ENCOUNTER — Encounter: Payer: Medicare Other | Admitting: Orthopaedic Surgery

## 2020-10-04 NOTE — Progress Notes (Signed)
Surgical Instructions    Your procedure is scheduled on 10/07/20.  Report to [facility] Main Entrance "A" at 05:30 A.M., then check in with the Admitting office.  Call this number if you have problems the morning of surgery:  [phone]   If you have any questions prior to your surgery date call [phone]: Open Monday-Friday 8am-4pm    Remember:  Do not eat after midnight the night before your surgery  You may drink clear liquids until 04:30am the morning of your surgery.   Clear liquids allowed are: Water, Non-Citrus Juices (without pulp), Carbonated Beverages, Clear Tea, Black Coffee ONLY (NO MILK, CREAM OR POWDERED CREAMER of any kind), and Gatorade    Take these medicines the morning of surgery with A SIP OF WATER  buPROPion (WELLBUTRIN XL)  methocarbamol (ROBAXIN) if needed   As of today, STOP taking any Aspirin (unless otherwise instructed by your surgeon) Aleve, Naproxen, Ibuprofen, Motrin, Advil, Goody's, BC's, all herbal medications, fish oil, and all vitamins.          Do not wear jewelry or makeup Do not wear lotions, powders, perfumes, or deodorant. Do not shave 48 hours prior to surgery.   Do not bring valuables to the hospital. DO Not wear nail polish, gel polish, artificial nails, or any other type of covering on natural nails including finger and toenails. If patients have artificial nails, gel coating, etc. that need to be removed by a nail salon please have this removed prior to surgery or surgery may need to be canceled/delayed if the surgeon/ anesthesia feels like the patient is unable to be adequately monitored.             [facility] is not responsible for any belongings or valuables.  Do NOT Smoke (Tobacco/Vaping)  24 hours prior to your procedure If you use a CPAP at night, you may bring your mask for your overnight stay.   Contacts, glasses, dentures or bridgework may not be worn into surgery, please bring cases for these belongings   For patients  admitted to the hospital, discharge time will be determined by your treatment team.   Patients discharged the day of surgery will not be allowed to drive home, and someone needs to stay with them for 24 hours.  NO VISITORS WILL BE ALLOWED IN PRE-OP WHERE PATIENTS GET READY FOR SURGERY.  ONLY 1 SUPPORT PERSON MAY BE PRESENT WHILE YOU ARE IN SURGERY.  IF YOU ARE TO BE ADMITTED, ONCE YOU ARE IN YOUR ROOM YOU WILL BE ALLOWED TWO (2) VISITORS.  Minor children may have two parents present. Special consideration for safety and communication needs will be reviewed on a case by case basis.  Special instructions:    Oral Hygiene is also important to reduce your risk of infection.  Remember - BRUSH YOUR TEETH THE MORNING OF SURGERY WITH YOUR REGULAR TOOTHPASTE   [facility]- Preparing For Surgery  Before surgery, you can play an important role. Because skin is not sterile, your skin needs to be as free of germs as possible. You can reduce the number of germs on your skin by washing with CHG (chlorahexidine gluconate) Soap before surgery.  CHG is an antiseptic cleaner which kills germs and bonds with the skin to continue killing germs even after washing.     Please do not use if you have an allergy to CHG or antibacterial soaps. If your skin becomes reddened/irritated stop using the CHG.  Do not shave (including legs and underarms) for  at least 48 hours prior to first CHG shower. It is OK to shave your face.  Please follow these instructions carefully.     Shower the NIGHT BEFORE SURGERY and the MORNING OF SURGERY with CHG Soap.   If you chose to wash your hair, wash your hair first as usual with your normal shampoo. After you shampoo, rinse your hair and body thoroughly to remove the shampoo.  Then [employer] and genitals (private parts) with your normal soap and rinse thoroughly to remove soap.  After that Use CHG Soap as you would any other liquid soap. You can apply CHG directly to the skin and wash  gently with a scrungie or a clean washcloth.   Apply the CHG Soap to your body ONLY FROM THE NECK DOWN.  Do not use on open wounds or open sores. Avoid contact with your eyes, ears, mouth and genitals (private parts). Wash Face and genitals (private parts)  with your normal soap.   Wash thoroughly, paying special attention to the area where your surgery will be performed.  Thoroughly rinse your body with warm water from the neck down.  DO NOT shower/wash with your normal soap after using and rinsing off the CHG Soap.  Pat yourself dry with a CLEAN TOWEL.  Wear CLEAN PAJAMAS to bed the night before surgery  Place CLEAN SHEETS on your bed the night before your surgery  DO NOT SLEEP WITH PETS.   Day of Surgery: Take a shower with CHG soap. Wear Clean/Comfortable clothing the morning of surgery Do not apply any deodorants/lotions.   Remember to brush your teeth WITH YOUR REGULAR TOOTHPASTE.   Please read over the following fact sheets that you were given.

## 2020-10-05 ENCOUNTER — Encounter (HOSPITAL_COMMUNITY): Payer: Self-pay

## 2020-10-05 ENCOUNTER — Encounter (HOSPITAL_COMMUNITY)
Admission: RE | Admit: 2020-10-05 | Discharge: 2020-10-05 | Disposition: A | Discharge status: Home / Self Care | Payer: Medicare Other | Source: Ambulatory Visit | Attending: Orthopaedic Surgery | Admitting: Orthopaedic Surgery

## 2020-10-05 ENCOUNTER — Other Ambulatory Visit: Payer: Self-pay

## 2020-10-05 DIAGNOSIS — Z791 Long term (current) use of non-steroidal anti-inflammatories (NSAID): Secondary | ICD-10-CM | POA: Insufficient documentation

## 2020-10-05 DIAGNOSIS — Z01812 Encounter for preprocedural laboratory examination: Secondary | ICD-10-CM | POA: Insufficient documentation

## 2020-10-05 DIAGNOSIS — Z87891 Personal history of nicotine dependence: Secondary | ICD-10-CM | POA: Insufficient documentation

## 2020-10-05 DIAGNOSIS — G4733 Obstructive sleep apnea (adult) (pediatric): Secondary | ICD-10-CM | POA: Insufficient documentation

## 2020-10-05 DIAGNOSIS — Z79899 Other long term (current) drug therapy: Secondary | ICD-10-CM | POA: Insufficient documentation

## 2020-10-05 DIAGNOSIS — D649 Anemia, unspecified: Secondary | ICD-10-CM | POA: Insufficient documentation

## 2020-10-05 DIAGNOSIS — Z981 Arthrodesis status: Secondary | ICD-10-CM | POA: Insufficient documentation

## 2020-10-05 DIAGNOSIS — Z20822 Contact with and (suspected) exposure to covid-19: Secondary | ICD-10-CM | POA: Insufficient documentation

## 2020-10-05 DIAGNOSIS — M2170 Unequal limb length (acquired), unspecified site: Secondary | ICD-10-CM | POA: Insufficient documentation

## 2020-10-05 DIAGNOSIS — I10 Essential (primary) hypertension: Secondary | ICD-10-CM | POA: Insufficient documentation

## 2020-10-05 DIAGNOSIS — D34 Benign neoplasm of thyroid gland: Secondary | ICD-10-CM | POA: Insufficient documentation

## 2020-10-05 HISTORY — DX: Headache, unspecified: R51.9

## 2020-10-05 LAB — SURGICAL PCR SCREEN
MRSA, PCR: NEGATIVE
Staphylococcus aureus: NEGATIVE

## 2020-10-05 LAB — BASIC METABOLIC PANEL
Anion gap: 5 (ref 5–15)
BUN: 13 mg/dL (ref 8–23)
CO2: 27 mmol/L (ref 22–32)
Calcium: 9.3 mg/dL (ref 8.9–10.3)
Chloride: 106 mmol/L (ref 98–111)
Creatinine, Ser: 0.89 mg/dL (ref 0.44–1.00)
GFR, Estimated: >60 mL/min (ref >60)
Glucose, Bld: 100 mg/dL — ABNORMAL HIGH (ref 70–99)
Potassium: 4 mmol/L (ref 3.5–5.1)
Sodium: 138 mmol/L (ref 135–145)

## 2020-10-05 LAB — CBC
HCT: 43.7 % (ref 36.0–46.0)
Hemoglobin: 14 g/dL (ref 12.0–15.0)
MCH: 29.5 pg (ref 26.0–34.0)
MCHC: 32 g/dL (ref 30.0–36.0)
MCV: 92 fL (ref 80.0–100.0)
Platelets: 286 10*3/uL (ref 150–400)
RBC: 4.75 MIL/uL (ref 3.87–5.11)
RDW: 13 % (ref 11.5–15.5)
WBC: 7 10*3/uL (ref 4.0–10.5)
nRBC: 0 % (ref 0.0–0.2)

## 2020-10-05 LAB — TYPE AND SCREEN
ABO/RH(D): O NEG
Antibody Screen: NEGATIVE

## 2020-10-05 LAB — SARS CORONAVIRUS 2 (TAT 6-24 HRS): SARS Coronavirus 2: NEGATIVE

## 2020-10-05 NOTE — Progress Notes (Signed)
PCP - [name] Cardiologist - denies Neurology: Metta Clines  PPM/ICD - denies   Chest x-ray - n/a EKG - 07/30/20 Stress Test - denies ECHO - 07/15/20 Cardiac Cath - denies  Sleep Study - yes CPAP - recommended but patient does not wear it   No diabetes  As of today, STOP taking any Aspirin (unless otherwise instructed by your surgeon) Aleve, Naproxen, Ibuprofen, Motrin, Advil, Goody's, BC's, all herbal medications, fish oil, and all vitamins.  ERAS Protcol -yes PRE-SURGERY Ensure or G2- no  COVID TEST- completed in PAT 10/05/20   Anesthesia review: multiple cancelled surgeries. Received neurology clearance.   Patient denies shortness of breath, fever, cough and chest pain at PAT appointment   All instructions explained to the patient, with a verbal understanding of the material. Patient agrees to go over the instructions while at home for a better understanding. Patient also instructed to self quarantine after being tested for COVID-19. The opportunity to ask questions was provided.

## 2020-10-06 DIAGNOSIS — F419 Anxiety disorder, unspecified: Secondary | ICD-10-CM | POA: Diagnosis not present

## 2020-10-06 DIAGNOSIS — F331 Major depressive disorder, recurrent, moderate: Secondary | ICD-10-CM | POA: Diagnosis not present

## 2020-10-06 NOTE — Progress Notes (Signed)
Anesthesia Chart Review:  Case: [id number] Date/Time: 10/07/20 0715   Procedure: RIGHT HIP REVISION OF  HIP BALL/POLY-LINER (Right: Hip) - RNFA PLEASE   Anesthesia type: Choice   Pre-op diagnosis: Leg Length Discrepancy, History of Right Total Hip Arthroplasty   Location: [facility] OR ROOM 06 / [place] [facility]   Surgeons: Mcarthur Rossetti, MD       DISCUSSION: Patient is a 75 year old female scheduled for the above procedure. Surgery was initially scheduled for 08/03/20, but cancelled because she did not have surgical clearance following recent TIA-type symptoms. Since then patient was evaluated by neurologist Dr. Tomi Likens. Per 09/03/20 note, on 07/09/20 she was anxious while on the phone with her daughter and kept referring to her daughter's trip as on a Tuesday and not a Wednesday. There was not slurred speech, facial droop, or unilateral numbness or weakness. She does have gait difficulty, but thought due to discrepancy in leg length. She mentioned this to her PCP Dr. Justin Mend who ordered a brain MRI, echo, and carotid US. Brain MRI was negative for acute CVA. Echo and carotid US did not show any source of potential emboli. Per Dr. Tomi Likens, "I do not think that she had a TIA.  It appears that she mistakenly said Tuesday instead of Wednesday perhaps because she was preoccupied or not completely listening (she said that she was feeling anxious).  However, she did not exhibit any lateralizing symptoms such as aphasia, dysarthria, confusion, facial droop, unilateral numbness or weakness.  Stroke workup unremarkable.  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   Reviewed neurology clearance with anesthesiologist Renold Don, MD. Anesthesia team will evaluate on the day of  surgery. 10/05/20 presurgical COVID-19 test was negative.    VS: BP 134/78   Pulse 76   Temp 36.7 C (Oral)   Resp 18   Ht 5\' 5"  (1.651 m)   Wt 82.1 kg   SpO2 98%   BMI 30.12 kg/m    PROVIDERS: Maurice Small, MD is PCP  Metta Clines, DO is neurologist Renato Shin, MD is endocrinologist   LABS: Labs reviewed: Acceptable for surgery. AST/ALT normal 08/27/20.  (all labs ordered are listed, but only abnormal results are displayed)  Labs Reviewed  BASIC METABOLIC PANEL - Abnormal; Notable for the following components:      Result Value   Glucose, Bld 100 (*)    All other components within normal limits  SURGICAL PCR SCREEN  SARS CORONAVIRUS 2 (TAT 6-24 HRS)  CBC  TYPE AND SCREEN     IMAGES: MRI Brain 07/21/20: IMPRESSION: - No acute abnormality - Generalized atrophy with mild white matter changes consistent with chronic microvascular ischemia.    EKG: 07/30/20: Normal sinus rhythm Possible Left atrial enlargement Incomplete right bundle branch block Nonspecific T wave abnormality Abnormal ECG No significant change since last tracing Confirmed by Mertie Moores ([id number]) on 08/01/2020 11:24:17 AM   CV: US Carotid 07/27/20: IMPRESSION: 1. Bilateral carotid bifurcation plaque resulting in less than 50% diameter ICA stenosis. 2. Antegrade bilateral vertebral arterial flow.    Echo 07/15/20: IMPRESSIONS   1. Left ventricular ejection fraction, by estimation, is 55 to 60%. Left  ventricular ejection fraction by 3D volume is 56 %. The left ventricle has  normal function. The left ventricle has no regional wall motion  abnormalities. Left ventricular diastolic   parameters were normal. The average left ventricular global longitudinal  strain is -22.9 %. The global longitudinal strain is normal.   2. Right ventricular systolic function is normal. The right ventricular  size is normal. There is normal pulmonary artery systolic pressure. The  estimated right ventricular  systolic pressure is 03.7 mmHg.   3. The mitral valve is grossly normal. No evidence of mitral valve  regurgitation. No evidence of mitral stenosis.   4. The aortic valve is tricuspid. Aortic valve regurgitation is not  visualized. No aortic stenosis is present.   5. The inferior vena cava is normal in size with greater than 50%  respiratory variability, suggesting right atrial pressure of 3 mmHg.   Conclusion(s)/Recommendation(s): No intracardiac source of embolism  detected on this transthoracic study. A transesophageal echocardiogram is  recommended to exclude cardiac source of embolism if clinically indicated.     Past Medical History:  Diagnosis Date   Anemia    Congenital musculoskeletal deformity of spine    Depression    Headache    Hypertension    Incontinence of bowel 09/2013   Lumbago    Lumbosacral spondylosis without myelopathy    Sleep apnea    Sleep disorder    uses gabapentin at bedtime    Spinal stenosis, lumbar region, without neurogenic claudication     Past Surgical History:  Procedure Laterality Date   ANKLE SURGERY Right 2015   lengthened the achilles    AUGMENTATION MAMMAPLASTY Bilateral 2000 or 2001 unsure    Left Implant has busted and is no longer visible   BACK SURGERY  2015   fusion , Dr Patrice Paradise at [facility]; has 2 metal rods in place , fusion from t2 to s1    BACK SURGERY  01/2017   Dr Rennis Harding ; repair of cracked titanium rod in back lumbar    BREAST EXCISIONAL BIOPSY Left    30 yrs ago   bunionectomy  Bilateral 1980s   multiple    COLONOSCOPY     with polypectomy ; q 5 years    FOOT SURGERY  07/2011   SHOULDER SURGERY Left 2013   rotator cuff    TOTAL HIP ARTHROPLASTY Right 05/08/2017   Procedure: RIGHT TOTAL HIP ARTHROPLASTY ANTERIOR APPROACH;  Surgeon: Paralee Cancel, MD;  Location: [facility] ORS;  Service: Orthopedics;  Laterality: Right;  70 mins    MEDICATIONS:  buPROPion (WELLBUTRIN XL) 150 MG 24 hr tablet   docusate sodium  (COLACE) 50 MG capsule   gabapentin (NEURONTIN) 600 MG tablet   losartan (COZAAR) 50 MG tablet   Menthol, Topical Analgesic, (BIOFREEZE) 10 % LIQD   methocarbamol (ROBAXIN) 750 MG tablet   naproxen sodium (ALEVE) 220 MG tablet   rosuvastatin (CRESTOR) 20 MG tablet   Vitamin D, Ergocalciferol, (DRISDOL) 50000 UNITS CAPS capsule   No current facility-administered medications for this encounter.    Myra Gianotti, PA-C Surgical Short Stay/Anesthesiology [facility] Phone [phone] [facility] Phone [phone] 10/06/2020 10:01 AM

## 2020-10-06 NOTE — Anesthesia Preprocedure Evaluation (Addendum)
Anesthesia Evaluation  Patient identified by MRN, date of birth, ID band Patient awake    Reviewed: Allergy & Precautions, NPO status , Patient's Chart, lab work & pertinent test results  Airway Mallampati: II  TM Distance: >3 FB     Dental   Pulmonary sleep apnea , former smoker,    breath sounds clear to auscultation       Cardiovascular hypertension,  Rhythm:Regular Rate:Normal     Neuro/Psych  Headaches,    GI/Hepatic negative GI ROS,   Endo/Other  negative endocrine ROS  Renal/GU negative Renal ROS     Musculoskeletal   Abdominal   Peds  Hematology   Anesthesia Other Findings   Reproductive/Obstetrics                            Anesthesia Physical Anesthesia Plan  ASA: 3  Anesthesia Plan: General   Post-op Pain Management:    Induction: Intravenous  PONV Risk Score and Plan: 3 and Ondansetron, Dexamethasone and Midazolam  Airway Management Planned: Oral ETT  Additional Equipment:   Intra-op Plan:   Post-operative Plan: Extubation in OR  Informed Consent: I have reviewed the patients History and Physical, chart, labs and discussed the procedure including the risks, benefits and alternatives for the proposed anesthesia with the patient or authorized representative who has indicated his/her understanding and acceptance.     Dental advisory given  Plan Discussed with: CRNA and Anesthesiologist  Anesthesia Plan Comments: (PAT note written 10/06/2020 by Myra Gianotti, PA-C. Has neurology clearance from Dr. Tomi Likens.  )      Anesthesia Quick Evaluation

## 2020-10-07 ENCOUNTER — Inpatient Hospital Stay (HOSPITAL_COMMUNITY): Payer: Medicare Other | Admitting: Anesthesiology

## 2020-10-07 ENCOUNTER — Encounter (HOSPITAL_COMMUNITY)
Admission: RE | Disposition: A | Discharge status: Home Health | Payer: Self-pay | Source: Home / Self Care | Attending: Orthopaedic Surgery

## 2020-10-07 ENCOUNTER — Inpatient Hospital Stay (HOSPITAL_COMMUNITY)
Admission: RE | Admit: 2020-10-07 | Discharge: 2020-10-12 | DRG: 468 | Disposition: A | Discharge status: Home Health | Payer: Medicare Other | Attending: Orthopaedic Surgery | Admitting: Orthopaedic Surgery

## 2020-10-07 ENCOUNTER — Inpatient Hospital Stay (HOSPITAL_COMMUNITY): Payer: Medicare Other

## 2020-10-07 ENCOUNTER — Other Ambulatory Visit: Payer: Self-pay

## 2020-10-07 ENCOUNTER — Encounter (HOSPITAL_COMMUNITY): Payer: Self-pay | Admitting: Orthopaedic Surgery

## 2020-10-07 ENCOUNTER — Inpatient Hospital Stay (HOSPITAL_COMMUNITY): Payer: Medicare Other | Admitting: Vascular Surgery

## 2020-10-07 DIAGNOSIS — T8489XA Other specified complication of internal orthopedic prosthetic devices, implants and grafts, initial encounter: Principal | ICD-10-CM | POA: Diagnosis present

## 2020-10-07 DIAGNOSIS — Z818 Family history of other mental and behavioral disorders: Secondary | ICD-10-CM | POA: Diagnosis not present

## 2020-10-07 DIAGNOSIS — Z96651 Presence of right artificial knee joint: Secondary | ICD-10-CM | POA: Diagnosis not present

## 2020-10-07 DIAGNOSIS — Y792 Prosthetic and other implants, materials and accessory orthopedic devices associated with adverse incidents: Secondary | ICD-10-CM | POA: Diagnosis present

## 2020-10-07 DIAGNOSIS — M21751 Unequal limb length (acquired), right femur: Secondary | ICD-10-CM | POA: Diagnosis not present

## 2020-10-07 DIAGNOSIS — Z825 Family history of asthma and other chronic lower respiratory diseases: Secondary | ICD-10-CM

## 2020-10-07 DIAGNOSIS — I10 Essential (primary) hypertension: Secondary | ICD-10-CM | POA: Diagnosis not present

## 2020-10-07 DIAGNOSIS — Z8349 Family history of other endocrine, nutritional and metabolic diseases: Secondary | ICD-10-CM

## 2020-10-07 DIAGNOSIS — Z823 Family history of stroke: Secondary | ICD-10-CM

## 2020-10-07 DIAGNOSIS — M419 Scoliosis, unspecified: Secondary | ICD-10-CM | POA: Diagnosis present

## 2020-10-07 DIAGNOSIS — Z87891 Personal history of nicotine dependence: Secondary | ICD-10-CM

## 2020-10-07 DIAGNOSIS — Z471 Aftercare following joint replacement surgery: Secondary | ICD-10-CM | POA: Diagnosis not present

## 2020-10-07 DIAGNOSIS — T84090A Other mechanical complication of internal right hip prosthesis, initial encounter: Secondary | ICD-10-CM | POA: Diagnosis not present

## 2020-10-07 DIAGNOSIS — G4730 Sleep apnea, unspecified: Secondary | ICD-10-CM | POA: Diagnosis not present

## 2020-10-07 DIAGNOSIS — M2170 Unequal limb length (acquired), unspecified site: Secondary | ICD-10-CM

## 2020-10-07 DIAGNOSIS — Z20822 Contact with and (suspected) exposure to covid-19: Secondary | ICD-10-CM | POA: Diagnosis present

## 2020-10-07 DIAGNOSIS — Z96649 Presence of unspecified artificial hip joint: Secondary | ICD-10-CM

## 2020-10-07 DIAGNOSIS — Z96641 Presence of right artificial hip joint: Secondary | ICD-10-CM | POA: Diagnosis not present

## 2020-10-07 DIAGNOSIS — Z419 Encounter for procedure for purposes other than remedying health state, unspecified: Secondary | ICD-10-CM

## 2020-10-07 HISTORY — PX: ANTERIOR HIP REVISION: SHX6527

## 2020-10-07 SURGERY — REVISION, TOTAL ARTHROPLASTY, HIP, ANTERIOR APPROACH
Anesthesia: General | Site: Hip | Laterality: Right

## 2020-10-07 MED ORDER — HYDROMORPHONE HCL 1 MG/ML IJ SOLN: 0.5000 mg | INTRAMUSCULAR | Status: DC | PRN

## 2020-10-07 MED ORDER — GABAPENTIN 300 MG PO CAPS
600.0000 mg | ORAL_CAPSULE | Freq: Every day | ORAL | Status: DC
  Administered 2020-10-07 – 2020-10-11 (×5): 600 mg via ORAL
  Filled 2020-10-07 (×5): qty 2

## 2020-10-07 MED ORDER — PHENOL 1.4 % MT LIQD: 1.0000 | OROMUCOSAL | Status: DC | PRN

## 2020-10-07 MED ORDER — LIDOCAINE HCL (CARDIAC) PF 100 MG/5ML IV SOSY
PREFILLED_SYRINGE | INTRAVENOUS | Status: DC | PRN
  Administered 2020-10-07: 100 mg via INTRAVENOUS

## 2020-10-07 MED ORDER — OXYCODONE HCL 5 MG PO TABS
5.0000 mg | ORAL_TABLET | ORAL | Status: DC | PRN
  Administered 2020-10-07 (×2): 5 mg via ORAL
  Filled 2020-10-07: qty 2
  Filled 2020-10-07: qty 1

## 2020-10-07 MED ORDER — MENTHOL 3 MG MT LOZG: 1.0000 | LOZENGE | OROMUCOSAL | Status: DC | PRN

## 2020-10-07 MED ORDER — ROSUVASTATIN CALCIUM 20 MG PO TABS
20.0000 mg | ORAL_TABLET | Freq: Every day | ORAL | Status: DC
  Filled 2020-10-07: qty 1

## 2020-10-07 MED ORDER — CHLORHEXIDINE GLUCONATE 0.12 % MT SOLN
15.0000 mL | Freq: Once | OROMUCOSAL | Status: AC
  Administered 2020-10-07: 15 mL via OROMUCOSAL
  Filled 2020-10-07: qty 15

## 2020-10-07 MED ORDER — LACTATED RINGERS IV SOLN
INTRAVENOUS | Status: DC | PRN
Start: 2020-10-07 — End: 2020-10-07

## 2020-10-07 MED ORDER — PANTOPRAZOLE SODIUM 40 MG PO TBEC
40.0000 mg | DELAYED_RELEASE_TABLET | Freq: Every day | ORAL | Status: DC
  Administered 2020-10-07 – 2020-10-12 (×6): 40 mg via ORAL
  Filled 2020-10-07 (×6): qty 1

## 2020-10-07 MED ORDER — ASPIRIN 81 MG PO CHEW
81.0000 mg | CHEWABLE_TABLET | Freq: Two times a day (BID) | ORAL | Status: DC
  Administered 2020-10-07 – 2020-10-12 (×10): 81 mg via ORAL
  Filled 2020-10-07 (×11): qty 1

## 2020-10-07 MED ORDER — LACTATED RINGERS IV SOLN: INTRAVENOUS | Status: DC

## 2020-10-07 MED ORDER — SUFENTANIL CITRATE 50 MCG/ML IV SOLN
INTRAVENOUS | Status: DC | PRN
  Administered 2020-10-07 (×2): 10 ug via INTRAVENOUS

## 2020-10-07 MED ORDER — 0.9 % SODIUM CHLORIDE (POUR BTL) OPTIME
TOPICAL | Status: DC | PRN
  Administered 2020-10-07: 1000 mL

## 2020-10-07 MED ORDER — SUFENTANIL CITRATE 50 MCG/ML IV SOLN
INTRAVENOUS | Status: AC
  Filled 2020-10-07: qty 1

## 2020-10-07 MED ORDER — CEFAZOLIN SODIUM-DEXTROSE 1-4 GM/50ML-% IV SOLN
1.0000 g | Freq: Four times a day (QID) | INTRAVENOUS | Status: AC
Start: 2020-10-07 — End: 2020-10-07
  Administered 2020-10-07 (×2): 1 g via INTRAVENOUS
  Filled 2020-10-07 (×2): qty 50

## 2020-10-07 MED ORDER — FENTANYL CITRATE (PF) 100 MCG/2ML IJ SOLN
INTRAMUSCULAR | Status: AC
  Filled 2020-10-07: qty 2

## 2020-10-07 MED ORDER — ALUM & MAG HYDROXIDE-SIMETH 200-200-20 MG/5ML PO SUSP: 30.0000 mL | ORAL | Status: DC | PRN

## 2020-10-07 MED ORDER — METHOCARBAMOL 500 MG PO TABS
500.0000 mg | ORAL_TABLET | Freq: Four times a day (QID) | ORAL | Status: DC | PRN
  Administered 2020-10-07 – 2020-10-11 (×5): 500 mg via ORAL
  Filled 2020-10-07 (×6): qty 1

## 2020-10-07 MED ORDER — FENTANYL CITRATE (PF) 100 MCG/2ML IJ SOLN
25.0000 ug | INTRAMUSCULAR | Status: DC | PRN
  Administered 2020-10-07 (×3): 50 ug via INTRAVENOUS

## 2020-10-07 MED ORDER — BUPROPION HCL ER (XL) 150 MG PO TB24
450.0000 mg | ORAL_TABLET | Freq: Every day | ORAL | Status: DC
  Administered 2020-10-08 – 2020-10-12 (×5): 450 mg via ORAL
  Filled 2020-10-07 (×5): qty 3

## 2020-10-07 MED ORDER — ONDANSETRON HCL 4 MG/2ML IJ SOLN: 4.0000 mg | Freq: Four times a day (QID) | INTRAMUSCULAR | Status: DC | PRN

## 2020-10-07 MED ORDER — ACETAMINOPHEN 10 MG/ML IV SOLN
INTRAVENOUS | Status: DC | PRN
  Administered 2020-10-07: 1000 mg via INTRAVENOUS

## 2020-10-07 MED ORDER — LOSARTAN POTASSIUM 50 MG PO TABS
50.0000 mg | ORAL_TABLET | Freq: Every day | ORAL | Status: DC
  Administered 2020-10-07 – 2020-10-12 (×6): 50 mg via ORAL
  Filled 2020-10-07 (×6): qty 1

## 2020-10-07 MED ORDER — CEFAZOLIN SODIUM-DEXTROSE 2-3 GM-%(50ML) IV SOLR
INTRAVENOUS | Status: DC | PRN
  Administered 2020-10-07: 2 g via INTRAVENOUS

## 2020-10-07 MED ORDER — HYDROMORPHONE HCL 1 MG/ML IJ SOLN
0.2500 mg | INTRAMUSCULAR | Status: DC | PRN
  Administered 2020-10-07 (×2): 0.25 mg via INTRAVENOUS
  Administered 2020-10-07: 0.5 mg via INTRAVENOUS

## 2020-10-07 MED ORDER — PROPOFOL 10 MG/ML IV BOLUS
INTRAVENOUS | Status: DC | PRN
  Administered 2020-10-07: 160 mg via INTRAVENOUS

## 2020-10-07 MED ORDER — PROPOFOL 10 MG/ML IV BOLUS
INTRAVENOUS | Status: AC
  Filled 2020-10-07: qty 20

## 2020-10-07 MED ORDER — SODIUM CHLORIDE 0.9 % IR SOLN
Status: DC | PRN
  Administered 2020-10-07: 3000 mL

## 2020-10-07 MED ORDER — DIPHENHYDRAMINE HCL 12.5 MG/5ML PO ELIX: 12.5000 mg | ORAL_SOLUTION | ORAL | Status: DC | PRN

## 2020-10-07 MED ORDER — ROCURONIUM 10MG/ML (10ML) SYRINGE FOR MEDFUSION PUMP - OPTIME
INTRAVENOUS | Status: DC | PRN
Start: 2020-10-07 — End: 2020-10-07
  Administered 2020-10-07: 50 mg via INTRAVENOUS

## 2020-10-07 MED ORDER — OXYCODONE HCL 5 MG PO TABS
10.0000 mg | ORAL_TABLET | ORAL | Status: DC | PRN
  Administered 2020-10-08 (×3): 10 mg via ORAL
  Administered 2020-10-11: 15 mg via ORAL
  Filled 2020-10-07: qty 3
  Filled 2020-10-07 (×3): qty 2

## 2020-10-07 MED ORDER — MIDAZOLAM HCL 2 MG/2ML IJ SOLN
INTRAMUSCULAR | Status: DC | PRN
Start: 2020-10-07 — End: 2020-10-07
  Administered 2020-10-07: 2 mg via INTRAVENOUS

## 2020-10-07 MED ORDER — ACETAMINOPHEN 325 MG PO TABS: 325.0000 mg | ORAL_TABLET | Freq: Four times a day (QID) | ORAL | Status: DC | PRN

## 2020-10-07 MED ORDER — DOCUSATE SODIUM 100 MG PO CAPS
100.0000 mg | ORAL_CAPSULE | Freq: Two times a day (BID) | ORAL | Status: DC
  Administered 2020-10-08 – 2020-10-12 (×9): 100 mg via ORAL
  Filled 2020-10-07 (×11): qty 1

## 2020-10-07 MED ORDER — DEXAMETHASONE SODIUM PHOSPHATE 10 MG/ML IJ SOLN
INTRAMUSCULAR | Status: DC | PRN
  Administered 2020-10-07: 10 mg via INTRAVENOUS

## 2020-10-07 MED ORDER — SODIUM CHLORIDE 0.9 % IV SOLN: INTRAVENOUS | Status: DC

## 2020-10-07 MED ORDER — ONDANSETRON HCL 4 MG PO TABS: 4.0000 mg | ORAL_TABLET | Freq: Four times a day (QID) | ORAL | Status: DC | PRN

## 2020-10-07 MED ORDER — HYDROMORPHONE HCL 1 MG/ML IJ SOLN
INTRAMUSCULAR | Status: AC
  Filled 2020-10-07: qty 1

## 2020-10-07 MED ORDER — CEFAZOLIN SODIUM-DEXTROSE 2-4 GM/100ML-% IV SOLN
INTRAVENOUS | Status: AC
  Filled 2020-10-07: qty 100

## 2020-10-07 MED ORDER — MIDAZOLAM HCL 2 MG/2ML IJ SOLN
INTRAMUSCULAR | Status: AC
  Filled 2020-10-07: qty 2

## 2020-10-07 MED ORDER — METHOCARBAMOL 1000 MG/10ML IJ SOLN
500.0000 mg | Freq: Four times a day (QID) | INTRAVENOUS | Status: DC | PRN
  Filled 2020-10-07: qty 5

## 2020-10-07 MED ORDER — ORAL CARE MOUTH RINSE: 15.0000 mL | Freq: Once | OROMUCOSAL | Status: AC

## 2020-10-07 MED ORDER — SUGAMMADEX SODIUM 200 MG/2ML IV SOLN
INTRAVENOUS | Status: DC | PRN
Start: 2020-10-07 — End: 2020-10-07
  Administered 2020-10-07: 200 mg via INTRAVENOUS

## 2020-10-07 MED ORDER — POLYETHYLENE GLYCOL 3350 17 G PO PACK
17.0000 g | PACK | Freq: Every day | ORAL | Status: DC | PRN
  Administered 2020-10-09 – 2020-10-10 (×2): 17 g via ORAL
  Filled 2020-10-07 (×2): qty 1

## 2020-10-07 MED ORDER — METOCLOPRAMIDE HCL 5 MG PO TABS: 5.0000 mg | ORAL_TABLET | Freq: Three times a day (TID) | ORAL | Status: DC | PRN

## 2020-10-07 MED ORDER — ONDANSETRON HCL 4 MG/2ML IJ SOLN
INTRAMUSCULAR | Status: DC | PRN
  Administered 2020-10-07: 4 mg via INTRAVENOUS

## 2020-10-07 MED ORDER — METOCLOPRAMIDE HCL 5 MG/ML IJ SOLN: 5.0000 mg | Freq: Three times a day (TID) | INTRAMUSCULAR | Status: DC | PRN

## 2020-10-07 SURGICAL SUPPLY — 50 items
BAG COUNTER SPONGE SURGICOUNT (BAG) ×2 IMPLANT
BALL HIP ARTICU EZE 36 8.5 (Hips) ×1 IMPLANT
BENZOIN TINCTURE PRP APPL 2/3 (GAUZE/BANDAGES/DRESSINGS) ×2 IMPLANT
BLADE CLIPPER SURG (BLADE) IMPLANT
BLADE SAW SGTL 18X1.27X75 (BLADE) ×2 IMPLANT
COVER SURGICAL LIGHT HANDLE (MISCELLANEOUS) ×2 IMPLANT
DRAPE C-ARM 42X72 X-RAY (DRAPES) ×2 IMPLANT
DRAPE STERI IOBAN 125X83 (DRAPES) ×2 IMPLANT
DRAPE U-SHAPE 47X51 STRL (DRAPES) ×6 IMPLANT
DRSG AQUACEL AG ADV 3.5X10 (GAUZE/BANDAGES/DRESSINGS) ×2 IMPLANT
DURAPREP 26ML APPLICATOR (WOUND CARE) ×2 IMPLANT
ELECT BLADE 4.0 EZ CLEAN MEGAD (MISCELLANEOUS) ×2
ELECT BLADE 6.5 EXT (BLADE) IMPLANT
ELECT REM PT RETURN 9FT ADLT (ELECTROSURGICAL) ×2
ELECTRODE BLDE 4.0 EZ CLN MEGD (MISCELLANEOUS) ×1 IMPLANT
ELECTRODE REM PT RTRN 9FT ADLT (ELECTROSURGICAL) ×1 IMPLANT
FACESHIELD WRAPAROUND (MASK) ×4 IMPLANT
GLOVE SRG 8 PF TXTR STRL LF DI (GLOVE) ×2 IMPLANT
GLOVE SURG LTX SZ8 (GLOVE) ×2 IMPLANT
GLOVE SURG ORTHO LTX SZ7.5 (GLOVE) ×4 IMPLANT
GLOVE SURG UNDER POLY LF SZ8 (GLOVE) ×2
GOWN STRL REUS W/ TWL LRG LVL3 (GOWN DISPOSABLE) ×2 IMPLANT
GOWN STRL REUS W/ TWL XL LVL3 (GOWN DISPOSABLE) ×2 IMPLANT
GOWN STRL REUS W/TWL LRG LVL3 (GOWN DISPOSABLE) ×2
GOWN STRL REUS W/TWL XL LVL3 (GOWN DISPOSABLE) ×2
HANDPIECE INTERPULSE COAX TIP (DISPOSABLE) ×1
HIP BALL ARTICU EZE 36 8.5 (Hips) ×2 IMPLANT
KIT BASIN OR (CUSTOM PROCEDURE TRAY) ×2 IMPLANT
KIT TURNOVER KIT B (KITS) ×2 IMPLANT
MANIFOLD NEPTUNE II (INSTRUMENTS) ×2 IMPLANT
NS IRRIG 1000ML POUR BTL (IV SOLUTION) ×2 IMPLANT
PACK TOTAL JOINT (CUSTOM PROCEDURE TRAY) ×2 IMPLANT
PAD ARMBOARD 7.5X6 YLW CONV (MISCELLANEOUS) ×2 IMPLANT
SET HNDPC FAN SPRY TIP SCT (DISPOSABLE) ×1 IMPLANT
STAPLER VISISTAT 35W (STAPLE) IMPLANT
STRIP CLOSURE SKIN 1/2X4 (GAUZE/BANDAGES/DRESSINGS) ×4 IMPLANT
SUT ETHIBOND NAB CT1 #1 30IN (SUTURE) ×2 IMPLANT
SUT MNCRL AB 4-0 PS2 18 (SUTURE) IMPLANT
SUT VIC AB 0 CT1 27 (SUTURE) ×1
SUT VIC AB 0 CT1 27XBRD ANBCTR (SUTURE) ×1 IMPLANT
SUT VIC AB 1 CT1 27 (SUTURE) ×1
SUT VIC AB 1 CT1 27XBRD ANBCTR (SUTURE) ×1 IMPLANT
SUT VIC AB 2-0 CT1 27 (SUTURE) ×1
SUT VIC AB 2-0 CT1 TAPERPNT 27 (SUTURE) ×1 IMPLANT
TOWEL GREEN STERILE (TOWEL DISPOSABLE) ×2 IMPLANT
TOWEL GREEN STERILE FF (TOWEL DISPOSABLE) ×2 IMPLANT
TRAY CATH 16FR W/PLASTIC CATH (SET/KITS/TRAYS/PACK) IMPLANT
TRAY FOLEY W/BAG SLVR 16FR (SET/KITS/TRAYS/PACK)
TRAY FOLEY W/BAG SLVR 16FR ST (SET/KITS/TRAYS/PACK) IMPLANT
WATER STERILE IRR 1000ML POUR (IV SOLUTION) ×4 IMPLANT

## 2020-10-07 NOTE — Evaluation (Signed)
Physical Therapy Evaluation Patient Details Name: [name] MRN: [id number] DOB: 1945-10-10 Today's Date: 10/07/2020  History of Present Illness  The pt is a 75 yo female presenting 9/29 for R THA revision due to leg length discrepancy (R<L) causing balance deficits and back pain. PMH includes: anemia, depression, HTN, back pain, sleep apnea, and x2 back surgeries (fusion T2-S1)   Clinical Impression  Pt in bed upon arrival of PT, agreeable to evaluation at this time. Prior to admission the pt was mobilizing with use of SPC, but reports she was limited only by pain and was otherwise able to complete ADLs without assist. The pt now presents with limitations in functional mobility, stability, power, and strength due to above dx and resulting pain, and will continue to benefit from skilled PT to address these deficits. The pt was able to complete bed mobility with modA to manage movement of BLE and trunk at this time, but was able to complete sit-stand transfers with minG for safety with use of RW. The pt was then able to complete short bout of ambulation in the room with use of RW and minG for safety. She moves slowly but steadily with no overt LOB. Will continue to benefit from skilled PT acutely to progress mobility, stair training, and to establish HEP prior to anticipated d/c home.        Recommendations for follow up therapy are one component of a multi-disciplinary discharge planning process, led by the attending physician.  Recommendations may be updated based on patient status, additional functional criteria and insurance authorization.  Follow Up Recommendations Home health PT;Supervision for mobility/OOB    Equipment Recommendations  None recommended by PT    Recommendations for Other Services       Precautions / Restrictions Precautions Precautions: Fall;Anterior Hip Precaution Comments: low fall Required Braces or Orthoses: Knee Immobilizer - Right Knee Immobilizer -  Right: Other (comment) (no written order, pt states MD verbally informed her to maintain at all times) Restrictions Weight Bearing Restrictions: Yes RLE Weight Bearing: Weight bearing as tolerated Other Position/Activity Restrictions: "strict anterior hip precautions with no external rotation or abduction of   that right hip" per op note      Mobility  Bed Mobility Overal bed mobility: Needs Assistance Bed Mobility: Supine to Sit;Sit to Supine     Supine to sit: Mod assist Sit to supine: Mod assist   General bed mobility comments: modA to manage BLE and to elevate trunk from supine, modA to assist BLE back to bed    Transfers Overall transfer level: Needs assistance Equipment used: Rolling walker (2 wheeled) Transfers: Sit to/from Stand Sit to Stand: Min guard         General transfer comment: minG with cues for positioning of RLE and hand palcement to power up and lower.  Ambulation/Gait Ambulation/Gait assistance: Min guard Gait Distance (Feet): 20 Feet Assistive device: Rolling walker (2 wheeled) Gait Pattern/deviations: Step-to pattern;Decreased weight shift to right;Decreased dorsiflexion - right;Antalgic Gait velocity: decreased Gait velocity interpretation: <1.31 ft/sec, indicative of household ambulator General Gait Details: pt with decreased wt shift to R, but increased through session. no LOB, slow but steady with RW     Balance Overall balance assessment: Mild deficits observed, not formally tested  Pertinent Vitals/Pain Pain Assessment: 0-10 Pain Score: 2  Pain Location: R hip Pain Descriptors / Indicators: Discomfort Pain Intervention(s): Limited activity within patient's tolerance;Monitored during session;Repositioned;Ice applied    Home Living Family/patient expects to be discharged to:: Private residence Living Arrangements: Spouse/significant other Available Help at Discharge:  Family;Available 24 hours/day Type of Home: House Home Access: Stairs to enter Entrance Stairs-Rails: None Entrance Stairs-Number of Steps: 2 Home Layout: One level Home Equipment: Walker - 2 wheels;Cane - single point;Bedside commode;Shower seat;Hand held shower head Additional Comments: talked about grab bars for bathroom    Prior Function Level of Independence: Independent with assistive device(s)         Comments: hired cleaners, pt mobilizing with cane, independent with ADLs     Hand Dominance   Dominant Hand: Right    Extremity/Trunk Assessment   Upper Extremity Assessment Upper Extremity Assessment: Overall WFL for tasks assessed (pt reports R shoulder bursitis with use of cane in past, WFL at eval)    Lower Extremity Assessment Lower Extremity Assessment: RLE deficits/detail RLE Deficits / Details: limited by pain and precautions, pt with good movement at ankle, reports no change in sensation RLE: Unable to fully assess due to immobilization RLE Sensation: WNL    Cervical / Trunk Assessment Cervical / Trunk Assessment: Normal  Communication   Communication: No difficulties  Cognition Arousal/Alertness: Awake/alert Behavior During Therapy: WFL for tasks assessed/performed Overall Cognitive Status: Within Functional Limits for tasks assessed                                        General Comments General comments (skin integrity, edema, etc.): VSS on RA    Exercises     Assessment/Plan    PT Assessment Patient needs continued PT services  PT Problem List Decreased strength;Decreased range of motion;Decreased activity tolerance;Decreased balance;Decreased mobility;Pain       PT Treatment Interventions DME instruction;Gait training;Stair training;Functional mobility training;Therapeutic activities;Therapeutic exercise;Balance training    PT Goals (Current goals can be found in the Care Plan section)  Acute Rehab PT Goals Patient Stated  Goal: return home and eventually to exercise classes PT Goal Formulation: With patient Time For Goal Achievement: 10/21/20 Potential to Achieve Goals: Good    Frequency Min 5X/week    AM-PAC PT "6 Clicks" Mobility  Outcome Measure Help needed turning from your back to your side while in a flat bed without using bedrails?: A Lot Help needed moving from lying on your back to sitting on the side of a flat bed without using bedrails?: A Lot Help needed moving to and from a bed to a chair (including a wheelchair)?: A Little Help needed standing up from a chair using your arms (e.g., wheelchair or bedside chair)?: A Little Help needed to walk in hospital room?: A Little Help needed climbing 3-5 steps with a railing? : A Lot 6 Click Score: 15    End of Session Equipment Utilized During Treatment: Gait belt;Right knee immobilizer Activity Tolerance: Patient tolerated treatment well Patient left: in bed;with call bell/phone within reach;with bed alarm set Nurse Communication: Mobility status PT Visit Diagnosis: Other abnormalities of gait and mobility (R26.89);Pain Pain - Right/Left: Right Pain - part of body: Hip    Time: 1700-1749 PT Time Calculation (min) (ACUTE ONLY): 30 min   Charges:   PT Evaluation $PT Eval Low Complexity: 1 Low PT Treatments $Gait Training: 8-22 mins  [place] Carbo, PT, DPT   Acute Rehabilitation Department Pager #: [phone]  Sandra Cockayne 10/07/2020, 6:03 PM

## 2020-10-07 NOTE — Transfer of Care (Signed)
Immediate Anesthesia Transfer of Care Note  Patient: [name]  Procedure(s) Performed: RIGHT HIP REVISION OF  HIP BALL/POLY-LINER (Right: Hip)  Patient Location: PACU  Anesthesia Type:General  Level of Consciousness: awake, oriented, drowsy and patient cooperative  Airway & Oxygen Therapy: Patient Spontanous Breathing and Patient connected to nasal cannula oxygen  Post-op Assessment: Report given to RN, Post -op Vital signs reviewed and stable and Patient moving all extremities X 4  Post vital signs: Reviewed and stable  Last Vitals:  Vitals Value Taken Time  BP 113/87 10/07/20 0921  Temp    Pulse 61 10/07/20 0925  Resp 18 10/07/20 0925  SpO2 98 % 10/07/20 0925  Vitals shown include unvalidated device data.  Last Pain:  Vitals:   10/07/20 0614  TempSrc:   PainSc: 4          Complications: No notable events documented.

## 2020-10-07 NOTE — H&P (Signed)
[name] is an 75 y.o. female.   Chief Complaint: Leg length difference with the right shorter than left HPI: The patient is a 75 year old female who had a right total hip arthroplasty done by one of my colleagues in town 2019.  She does have scoliosis but also has a leg length difference/discrepancy with her right operative side shorter than the left.  This is set off her gait and her balance and is affected her back detrimentally.  We have seen her in the office and assessed her hip.  We recommended a revision arthroplasty with increasing the leg lengths to her hip ball and polyliner exchange.  Past Medical History:  Diagnosis Date   Anemia    Congenital musculoskeletal deformity of spine    Depression    Headache    Hypertension    Incontinence of bowel 09/2013   Lumbago    Lumbosacral spondylosis without myelopathy    Sleep apnea    Sleep disorder    uses gabapentin at bedtime    Spinal stenosis, lumbar region, without neurogenic claudication     Past Surgical History:  Procedure Laterality Date   ANKLE SURGERY Right 2015   lengthened the achilles    AUGMENTATION MAMMAPLASTY Bilateral 2000 or 2001 unsure    Left Implant has busted and is no longer visible   BACK SURGERY  2015   fusion , Dr Patrice Paradise at [facility]; has 2 metal rods in place , fusion from t2 to s1    BACK SURGERY  01/2017   Dr Rennis Harding ; repair of cracked titanium rod in back lumbar    BREAST EXCISIONAL BIOPSY Left    30 yrs ago   bunionectomy  Bilateral 1980s   multiple    COLONOSCOPY     with polypectomy ; q 5 years    FOOT SURGERY  07/2011   SHOULDER SURGERY Left 2013   rotator cuff    TOTAL HIP ARTHROPLASTY Right 05/08/2017   Procedure: RIGHT TOTAL HIP ARTHROPLASTY ANTERIOR APPROACH;  Surgeon: Paralee Cancel, MD;  Location: [facility];  Service: Orthopedics;  Laterality: Right;  70 mins    Family History  Problem Relation Age of Onset   Dementia Mother    Stroke Mother    Depression  Mother    Thyroid disease Mother    COPD Father    Cancer Brother    Breast cancer Neg Hx    Social History:  reports that she has quit smoking. She has never used smokeless tobacco. She reports current alcohol use of about 2.0 standard drinks per week. She reports that she does not use drugs.  Allergies:  Allergies  Allergen Reactions   Lisinopril Cough    Medications Prior to Admission  Medication Sig Dispense Refill   buPROPion (WELLBUTRIN XL) 150 MG 24 hr tablet Take 450 mg by mouth daily.     docusate sodium (COLACE) 50 MG capsule Take 50 mg by mouth daily.     gabapentin (NEURONTIN) 600 MG tablet Take 1 tablet (600 mg total) by mouth daily. 3 tabs QHS (Patient taking differently: Take 600 mg by mouth at bedtime.) 3 tablet 1   losartan (COZAAR) 50 MG tablet Take 50 mg by mouth daily.     Menthol, Topical Analgesic, (BIOFREEZE) 10 % LIQD Apply 1 application topically daily as needed (pain).     methocarbamol (ROBAXIN) 750 MG tablet Take 750 mg by mouth daily as needed for muscle spasms.     naproxen sodium (  ALEVE) 220 MG tablet Take 440 mg by mouth daily as needed (headache).     rosuvastatin (CRESTOR) 20 MG tablet Take 20 mg by mouth at bedtime.      Vitamin D, Ergocalciferol, (DRISDOL) 50000 UNITS CAPS capsule Take 50,000 Units by mouth every Thursday.       Results for orders placed or performed during the hospital encounter of 10/05/20 (from the past 48 hour(s))  Surgical pcr screen     Status: None   Collection Time: 10/05/20 10:16 AM   Specimen: Nasal Mucosa; Nasal Swab  Result Value Ref Range   MRSA, PCR NEGATIVE NEGATIVE   Staphylococcus aureus NEGATIVE NEGATIVE    Comment: (NOTE) The Xpert SA Assay (FDA approved for NASAL specimens in patients 25 years of age and older), is one component of a comprehensive surveillance program. It is not intended to diagnose infection nor to guide or monitor treatment. Performed at [facility], [place] [address]  [place], [place] [address]   SARS CORONAVIRUS 2 (TAT 6-24 HRS) Nasopharyngeal Nasopharyngeal Swab     Status: None   Collection Time: 10/05/20 10:16 AM   Specimen: Nasopharyngeal Swab  Result Value Ref Range   SARS Coronavirus 2 NEGATIVE NEGATIVE    Comment: (NOTE) SARS-CoV-2 target nucleic acids are NOT DETECTED.  The SARS-CoV-2 RNA is generally detectable in upper and lower respiratory specimens during the acute phase of infection. Negative results do not preclude SARS-CoV-2 infection, do not rule out co-infections with other pathogens, and should not be used as the sole basis for treatment or other patient management decisions. Negative results must be combined with clinical observations, patient history, and epidemiological information. The expected result is Negative.  Fact Sheet for Patients: SugarRoll.be  Fact Sheet for Healthcare Providers: https://www.woods-mathews.com/  This test is not yet approved or cleared by the [place] FDA and  has been authorized for detection and/or diagnosis of SARS-CoV-2 by FDA under an Emergency Use Authorization (EUA). This EUA will remain  in effect (meaning this test can be used) for the duration of the COVID-19 declaration under Se ction 564(b)(1) of the Act, 21 U.S.C. section 360bbb-3(b)(1), unless the authorization is terminated or revoked sooner.  Performed at [facility], [place] [address] [place], [place] [address]   Type and screen [place]     Status: None   Collection Time: 10/05/20 11:00 AM  Result Value Ref Range   ABO/RH(D) O NEG    Antibody Screen NEG    Sample Expiration 10/19/2020,2359    Extend sample reason      NO TRANSFUSIONS OR PREGNANCY IN THE PAST 3 MONTHS Performed at [facility], [place] [address], [place],  [address]   Basic metabolic panel per protocol     Status: Abnormal   Collection Time: 10/05/20 11:16 AM  Result Value Ref  Range   Sodium 138 135 - 145 mmol/L   Potassium 4.0 3.5 - 5.1 mmol/L   Chloride 106 98 - 111 mmol/L   CO2 27 22 - 32 mmol/L   Glucose, Bld 100 (H) 70 - 99 mg/dL    Comment: Glucose reference range applies only to samples taken after fasting for at least 8 hours.   BUN 13 8 - 23 mg/dL   Creatinine, Ser 0.89 0.44 - 1.00 mg/dL   Calcium 9.3 8.9 - 10.3 mg/dL   GFR, Estimated >60 >60 mL/min    Comment: (NOTE) Calculated using the CKD-EPI Creatinine Equation (2021)  Anion gap 5 5 - 15    Comment: Performed at [facility] [address] [place], [place] [address]  CBC per protocol     Status: None   Collection Time: 10/05/20 11:16 AM  Result Value Ref Range   WBC 7.0 4.0 - 10.5 K/uL   RBC 4.75 3.87 - 5.11 MIL/uL   Hemoglobin 14.0 12.0 - 15.0 g/dL   HCT 43.7 36.0 - 46.0 %   MCV 92.0 80.0 - 100.0 fL   MCH 29.5 26.0 - 34.0 pg   MCHC 32.0 30.0 - 36.0 g/dL   RDW 13.0 11.5 - 15.5 %   Platelets 286 150 - 400 K/uL   nRBC 0.0 0.0 - 0.2 %    Comment: Performed at [facility], [place] [address] [place], [place] [address]   No results found.  Review of Systems  Musculoskeletal:  Positive for back pain and gait problem.  All other systems reviewed and are negative.  Blood pressure (!) 135/94, pulse 69, temperature (!) 97.4 F (36.3 C), temperature source Oral, resp. rate 18, height 5\' 5"  (1.651 m), weight 82.1 kg, SpO2 95 %. Physical Exam Vitals reviewed.  Constitutional:      Appearance: Normal appearance.  HENT:     Head: Normocephalic and atraumatic.  Eyes:     Extraocular Movements: Extraocular movements intact.     Pupils: Pupils are equal, round, and reactive to light.  Cardiovascular:     Rate and Rhythm: Normal rate and regular rhythm.     Pulses: Normal pulses.  Pulmonary:     Effort: Pulmonary effort is normal.     Breath sounds: Normal breath sounds.  Abdominal:     Palpations: Abdomen is soft.  Musculoskeletal:     Cervical back: Normal range of motion  and neck supple.     Right hip: Deformity present.  Neurological:     Mental Status: She is alert and oriented to person, place, and time.  Psychiatric:        Behavior: Behavior normal.     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  We will go safely to increase her leg length as a means to balance her gait and improve her posture.  She does wear a lift of 1 inch in her right shoe currently.  We discussed the risks of fracture, infection, DVT, continued leg length discrepancy and implant failure.  We talked about the risks of acute blood loss anemia and nerve vessel injury.  Informed consent is obtained and the right has been marked.  Mcarthur Rossetti, MD 10/07/2020, 7:17 AM

## 2020-10-07 NOTE — Discharge Instructions (Signed)

## 2020-10-07 NOTE — Brief Op Note (Signed)
10/07/2020  9:08 AM  PATIENT:  [name]  75 y.o. female  PRE-OPERATIVE DIAGNOSIS:  Leg Length Discrepancy, History of Right Total Hip Arthroplasty  POST-OPERATIVE DIAGNOSIS:  Leg Length Discrepancy, History of Right Total Hip Arthroplasty  PROCEDURE:  Procedure(s) with comments: RIGHT HIP REVISION OF  HIP BALL/POLY-LINER (Right) - RNFA PLEASE  SURGEON:  Surgeon(s) and Role:    Mcarthur Rossetti, MD - Primary  PHYSICIAN ASSISTANT: Benita Stabile, PA-C  ANESTHESIA:   general  EBL:  100 mL   COUNTS:  YES  DICTATION: .Other Dictation: Dictation Number [id number]  PLAN OF CARE: Admit for overnight observation  PATIENT DISPOSITION:  PACU - hemodynamically stable.   Delay start of Pharmacological VTE agent (>24hrs) due to surgical blood loss or risk of bleeding: no

## 2020-10-07 NOTE — Anesthesia Postprocedure Evaluation (Signed)
Anesthesia Post Note  Patient: [name]  Procedure(s) Performed: RIGHT HIP REVISION OF  HIP BALL/POLY-LINER (Right: Hip)     Anesthesia Post Evaluation No notable events documented.  Last Vitals:  Vitals:   10/07/20 0559  BP: (!) 135/94  Pulse: 69  Resp: 18  Temp: (!) 36.3 C  SpO2: 95%    Last Pain:  Vitals:   10/07/20 0614  TempSrc:   PainSc: 4                  Ebonie Westerlund

## 2020-10-07 NOTE — Op Note (Signed)
NAMECAMYAH, [name] MEDICAL RECORD NO: [id number] ACCOUNT NO: [id number] DATE OF BIRTH: 10-10-1945 FACILITY: [facility] LOCATION: [facility] PHYSICIAN: Suzanne Guest. Ninfa Linden, MD  Operative Report   DATE OF PROCEDURE: 10/07/2020   PREOPERATIVE DIAGNOSIS:  Acquired leg length discrepancy, status post right total hip arthroplasty with the right leg an inch or more shorter than the left side.  POSTOPERATIVE DIAGNOSIS:  Acquired leg length discrepancy, status post right total hip arthroplasty with the right leg an inch or more shorter than the left side.  PROCEDURE:  Right hip revision of hip ball with upsizing and lengthening the right lower extremity with a longer hip ball.  EXPLANTS:  36+1.5 ceramic hip ball.  IMPLANTS:  36+8.5 metal hip ball.  SURGEON:  [name], M.D.   ASSISTANT: Suzanne Emery, PA-C.  ANESTHESIA:  General.  ANTIBIOTICS:  2 g IV Ancef.  ESTIMATED BLOOD LOSS:  100 mL.  COMPLICATIONS:  None.  INDICATIONS:  The patient is a 75 year old female who in 2019 underwent a primary right total hip arthroplasty through a direct anterior approach by one of my colleagues in town.  She has also had extensive back surgery in the lumbar spine and has some  scoliosis.  She unfortunately has a leg length discrepancy with the right operative side at least an inch or more short on the left side.  This was assessed on the x-rays and on clinical exam with her lying supine.  This puts off her gait and balance  quite a bit and has affected her back detrimentally.  She wears a lift in her shoe buildup at times on her right side.  She has dealt with this extensively and came to me for a second opinion.  Looking at her x-rays, the acetabular component is a little  bit more anteverted than what I would normally place it in and is more at an inclined level.  I have recommended upsizing her hip ball.  I did talk to her about the possibility of instability after this that would require  an acetabular revision to change  the anteversion and inclination.  We had a long and thorough discussion about the surgery including the risk of acute blood loss anemia, nerve or vessel injury, fracture, infection, DVT.  She understands our goals are hopefully balancing her gait, which  will improve her leg lengths and hopefully decrease her back pain and improve her posture without necessitating the need for further wear that she build ups.  DESCRIPTION OF PROCEDURE:  After informed consent was obtained, appropriate right hip was marked.  She was brought to the operating room where general anesthesia was obtained while she was on a stretcher.  I assessed her leg lengths again and again she  is significantly short on the right, comparing right and left side and is about an inch or more.  Traction boots were placed on both her feet and I placed her supine on the Hana fracture table, the perineal post in place and both legs in line skeletal  traction device and no traction applied.  I then assessed her hips again radiographically, so we could have a good starting point.  Her right hip was prepped and draped with DuraPrep and sterile drapes.  A timeout was called.  She was identified correct  patient, correct right hip.  I then assessed the incision that was made previously by my colleague in town and I felt this incision was a little bit too lateral and posterior, so I felt it was more  prudent to make an incision that I will be using to get  into a direct anterior approach to the hip.  I was able to do that and we dissected down to the tensor fascia muscle area.  I was able to open up the tensor fascia and then proceeded with direct anterior approach to the hip.  We found significant scar  tissue of the anterior capsule.  We had to excise the anterior capsule.  We were able to expose the hip and there was no evidence of infection and no joint effusion.  We then easily dislocated the hip and removed the  previous ceramic hip ball, which was  a 36+1.5 ceramic hip ball.  We then assessed the polyethylene liner in the acetabulum.  We did not see any wear of this.  We then decided to go ahead and trial up to an 8.5 trial hip ball and reduced this in acetabulum and we had increased her leg  lengths back out to equal.  Due to the verticality and anteversion of the cup, I am still concerned significantly about instability.  We dislocated the hip pretty easily, but I felt like it was not prudent to go up to a 12 hip ball because that would  then over lengthen her.  Hoping that she scars in.  We placed in the real 8.5 metal hip ball and reduced this in the acetabulum and verified its placement radiographically and clinically and we had increased her leg lengths back to equal.  We then  irrigated the soft tissue with normal saline solution using pulsatile lavage.  I closed any remnants of the joint capsule and soft tissue that I could trying to get this to scar and then tighten up.  This was closed with #1 Ethibond suture.  A #1 Vicryl  was used to close the tensor fascia and 0 Vicryl was used to close deep tissue and 2-0 Vicryl was used to close subcutaneous tissue.  The skin was closed with staples.  An Aquacel dressing was applied.  She was taken off the Hana table, awakened,  extubated, and taken to recovery room in stable condition with all final counts being correct.  No complications noted.  Postoperatively, I am actually going to have her adhere to strict anterior hip precautions with no external rotation or abduction of  that right hip until further notice.  If this becomes an issue of instability, she would need a revision of the acetabular component.  Of note, Suzanne Stabile, PA-C did assist during the entire case and assistance was helpful for facilitating every aspect of  this case from opening to closure.     Elián.Darby D: 10/07/2020 9:06:22 am T: 10/07/2020 9:29:00 am  JOB: [id number]/ [id number]

## 2020-10-07 NOTE — Plan of Care (Signed)

## 2020-10-07 NOTE — Anesthesia Postprocedure Evaluation (Signed)
Anesthesia Post Note  Patient: [name]  Procedure(s) Performed: RIGHT HIP REVISION OF  HIP BALL/POLY-LINER (Right: Hip)     Patient location during evaluation: PACU Anesthesia Type: General Level of consciousness: awake Pain management: pain level controlled Vital Signs Assessment: post-procedure vital signs reviewed and stable Respiratory status: spontaneous breathing Cardiovascular status: stable Postop Assessment: no apparent nausea or vomiting Anesthetic complications: no   No notable events documented.  Last Vitals:  Vitals:   10/07/20 1205 10/07/20 1249  BP:  130/81  Pulse: 66 74  Resp: 17 18  Temp: (!) 36.1 C (!) 36.4 C  SpO2: 100% 100%    Last Pain:  Vitals:   10/07/20 1249  TempSrc: Oral  PainSc: 4                  Sadao Weyer

## 2020-10-07 NOTE — Progress Notes (Signed)
Orthopedic Tech Progress Note Patient Details:  [name] 1945-07-10 [id number]  Ortho Devices Type of Ortho Device: Knee Immobilizer Ortho Device/Splint Location: RLE Ortho Device/Splint Interventions: Application, Ordered   Post Interventions Patient Tolerated: Well  [name] 10/07/2020, 9:54 AM

## 2020-10-07 NOTE — Anesthesia Procedure Notes (Signed)
Procedure Name: Intubation Date/Time: 10/07/2020 7:40 AM Performed by: Claris Che, CRNA Pre-anesthesia Checklist: Patient identified, Emergency Drugs available, Suction available, Patient being monitored and Timeout performed Patient Re-evaluated:Patient Re-evaluated prior to induction Oxygen Delivery Method: Circle system utilized Preoxygenation: Pre-oxygenation with 100% oxygen Induction Type: IV induction and Cricoid Pressure applied Ventilation: Mask ventilation without difficulty Laryngoscope Size: Mac and 3 Grade View: Grade II Tube type: Oral Tube size: 7.5 mm Number of attempts: 1 Airway Equipment and Method: Stylet Placement Confirmation: ETT inserted through vocal cords under direct vision, positive ETCO2 and breath sounds checked- equal and bilateral Secured at: 22 cm Tube secured with: Tape Dental Injury: Teeth and Oropharynx as per pre-operative assessment

## 2020-10-08 ENCOUNTER — Encounter (HOSPITAL_COMMUNITY): Payer: Self-pay | Admitting: Orthopaedic Surgery

## 2020-10-08 LAB — BASIC METABOLIC PANEL
Anion gap: 6 (ref 5–15)
BUN: 11 mg/dL (ref 8–23)
CO2: 24 mmol/L (ref 22–32)
Calcium: 8.3 mg/dL — ABNORMAL LOW (ref 8.9–10.3)
Chloride: 105 mmol/L (ref 98–111)
Creatinine, Ser: 0.8 mg/dL (ref 0.44–1.00)
GFR, Estimated: >60 mL/min (ref >60)
Glucose, Bld: 128 mg/dL — ABNORMAL HIGH (ref 70–99)
Potassium: 3.7 mmol/L (ref 3.5–5.1)
Sodium: 135 mmol/L (ref 135–145)

## 2020-10-08 LAB — CBC
HCT: 34.6 % — ABNORMAL LOW (ref 36.0–46.0)
Hemoglobin: 11.5 g/dL — ABNORMAL LOW (ref 12.0–15.0)
MCH: 30.1 pg (ref 26.0–34.0)
MCHC: 33.2 g/dL (ref 30.0–36.0)
MCV: 90.6 fL (ref 80.0–100.0)
Platelets: 215 10*3/uL (ref 150–400)
RBC: 3.82 MIL/uL — ABNORMAL LOW (ref 3.87–5.11)
RDW: 13 % (ref 11.5–15.5)
WBC: 13.1 10*3/uL — ABNORMAL HIGH (ref 4.0–10.5)
nRBC: 0 % (ref 0.0–0.2)

## 2020-10-08 MED ORDER — ROSUVASTATIN CALCIUM 20 MG PO TABS
20.0000 mg | ORAL_TABLET | Freq: Every day | ORAL | Status: DC
  Administered 2020-10-08 – 2020-10-12 (×5): 20 mg via ORAL
  Filled 2020-10-08 (×5): qty 1

## 2020-10-08 MED ORDER — HYDROCODONE-ACETAMINOPHEN 5-325 MG PO TABS
1.0000 | ORAL_TABLET | ORAL | Status: DC | PRN
  Administered 2020-10-08 – 2020-10-12 (×10): 2 via ORAL
  Filled 2020-10-08 (×10): qty 2

## 2020-10-08 NOTE — Progress Notes (Signed)
Subjective: 1 Day Post-Op Procedure(s) (LRB): RIGHT HIP REVISION OF  HIP BALL/POLY-LINER (Right) Patient reports pain as moderate.    Objective: Vital signs in last 24 hours: Temp:  [97 F (36.1 C)-98.1 F (36.7 C)] 98.1 F (36.7 C) (09/29 2105) Pulse Rate:  [60-89] 89 (09/29 2105) Resp:  [6-25] 18 (09/29 2105) BP: (113-152)/(73-105) 119/79 (09/29 2105) SpO2:  [95 %-100 %] 95 % (09/29 2105)  Intake/Output from previous day: 09/29 0701 - 09/30 0700 In: 1211.5 [I.V.:1161.5; IV Piggyback:50] Out: 900 [Urine:800; Blood:100] Intake/Output this shift: No intake/output data recorded.  Recent Labs    10/05/20 1116 10/08/20 0256  HGB 14.0 11.5*   Recent Labs    10/05/20 1116 10/08/20 0256  WBC 7.0 13.1*  RBC 4.75 3.82*  HCT 43.7 34.6*  PLT 286 215   Recent Labs    10/05/20 1116 10/08/20 0256  NA 138 135  K 4.0 3.7  CL 106 105  CO2 27 24  BUN 13 11  CREATININE 0.89 0.80  GLUCOSE 100* 128*  CALCIUM 9.3 8.3*   No results for input(s): LABPT, INR in the last 72 hours.  Sensation intact distally Intact pulses distally Dorsiflexion/Plantar flexion intact Incision: scant drainage   Assessment/Plan: 1 Day Post-Op Procedure(s) (LRB): RIGHT HIP REVISION OF  HIP BALL/POLY-LINER (Right) Up with therapy Discharge home with home health next 1-2 days depending on progress with therapy and pain control.  Only plan the knee immobilizer as a reminder for anterior hip precautions - no hip abduction and no external rotation.      Mcarthur Rossetti 10/08/2020, 7:06 AM

## 2020-10-08 NOTE — Progress Notes (Signed)
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 PMH includes: anemia, depression, HTN, back pain, sleep apnea, and x2 back surgeries (fusion T2-S1)    PT Comments    Pt and PT had discussion throughout session about her situation at home with help from husband.  He is cognitively impaired and cannot be as available to her for help.  Pt is likely to go directly home but asking for SNF stay to give her time to stand and sit with independence and safety without help.  Follow her daily to increase strength, reduce pain of mobility and increase stability of gait and transfers.  Will continue to see her as before due to her likelihood of electing to go directly home despite the issues.   Recommendations for follow up therapy are one component of a multi-disciplinary discharge planning process, led by the attending physician.  Recommendations may be updated based on patient status, additional functional criteria and insurance authorization.  Follow Up Recommendations  SNF     Equipment Recommendations  None recommended by PT    Recommendations for Other Services       Precautions / Restrictions Precautions Precautions: Fall;Anterior Hip Precaution Comments: low fall Required Braces or Orthoses: Knee Immobilizer - Right Knee Immobilizer - Right: On at all times Restrictions Weight Bearing Restrictions: Yes RLE Weight Bearing: Weight bearing as tolerated     Mobility  Bed Mobility Overal bed mobility: Needs Assistance Bed Mobility: Supine to Sit     Supine to sit: Min assist;Mod assist     General bed mobility comments: mod for short time to sit up then min to scoot to EOB    Transfers Overall transfer level: Needs assistance Equipment used:  Rolling walker (2 wheeled) Transfers: Sit to/from Stand Sit to Stand: Min assist         General transfer comment: min assist to power up from bed, mod to lower to chair  Ambulation/Gait Ambulation/Gait assistance: Min guard;Min assist Gait Distance (Feet): 30 Feet Assistive device: Rolling walker (2 wheeled) Gait Pattern/deviations: Step-through pattern;Step-to pattern;Decreased stride length;Decreased weight shift to right Gait velocity: decreased Gait velocity interpretation: <1.31 ft/sec, indicative of household ambulator General Gait Details: reminders for management of R hip rotation during turns to avoid precautions   Stairs Stairs: Yes Stairs assistance: Min guard Stair Management: One rail Right;One rail Left;Step to pattern;Forwards Number of Stairs: 10 General stair comments: pt is motivated and aware of the sequence from experience with hip since first surgery   Wheelchair Mobility    Modified Rankin (Stroke Patients Only)       Balance Overall balance assessment: Mild deficits observed, not formally tested                                          Cognition Arousal/Alertness: Awake/alert Behavior During Therapy: WFL for tasks assessed/performed Overall Cognitive Status: Within Functional Limits for tasks assessed                                        Exercises      General Comments General  comments (skin integrity, edema, etc.): pt is encouraged to consider rehab placement as her husband has cognitive issues and she is not going to have reliable assistance from him per her description      Pertinent Vitals/Pain Pain Assessment: Faces Faces Pain Scale: Hurts little more Pain Location: R hip Pain Descriptors / Indicators: Discomfort Pain Intervention(s): Limited activity within patient's tolerance;Monitored during session;Premedicated before session;Repositioned    Home Living                      Prior  Function            PT Goals (current goals can now be found in the care plan section) Progress towards PT goals: Progressing toward goals    Frequency    7X/week      PT Plan Discharge plan needs to be updated    Co-evaluation              AM-PAC PT "6 Clicks" Mobility   Outcome Measure  Help needed turning from your back to your side while in a flat bed without using bedrails?: A Lot Help needed moving from lying on your back to sitting on the side of a flat bed without using bedrails?: A Lot Help needed moving to and from a bed to a chair (including a wheelchair)?: A Lot Help needed standing up from a chair using your arms (e.g., wheelchair or bedside chair)?: A Little Help needed to walk in hospital room?: A Little Help needed climbing 3-5 steps with a railing? : A Lot 6 Click Score: 14    End of Session Equipment Utilized During Treatment: Gait belt;Right knee immobilizer Activity Tolerance: Patient tolerated treatment well Patient left: in chair;with call bell/phone within reach;with chair alarm set Nurse Communication: Mobility status PT Visit Diagnosis: Other abnormalities of gait and mobility (R26.89);Pain Pain - Right/Left: Right Pain - part of body: Hip     Time: 1351-1419 PT Time Calculation (min) (ACUTE ONLY): 28 min  Charges:  $Gait Training: 8-22 mins $Therapeutic Activity: 8-22 mins                 Ramond Dial 10/08/2020, 6:32 PM  Mee Hives, PT MS Acute Rehab Dept. Number: [place] and [place]

## 2020-10-08 NOTE — Progress Notes (Signed)
Patient would like her pain medication changed from Oxy IR Po to another oral pain medication.  She thinks the Oxy gave her a headache and she does not want the Dilaudid iv pain med.

## 2020-10-09 LAB — CBC
HCT: 35.1 % — ABNORMAL LOW (ref 36.0–46.0)
Hemoglobin: 11.1 g/dL — ABNORMAL LOW (ref 12.0–15.0)
MCH: 29.1 pg (ref 26.0–34.0)
MCHC: 31.6 g/dL (ref 30.0–36.0)
MCV: 92.1 fL (ref 80.0–100.0)
Platelets: 204 10*3/uL (ref 150–400)
RBC: 3.81 MIL/uL — ABNORMAL LOW (ref 3.87–5.11)
RDW: 13.3 % (ref 11.5–15.5)
WBC: 13.2 10*3/uL — ABNORMAL HIGH (ref 4.0–10.5)
nRBC: 0 % (ref 0.0–0.2)

## 2020-10-09 LAB — URINALYSIS, ROUTINE W REFLEX MICROSCOPIC
Bacteria, UA: NONE SEEN
Bilirubin Urine: NEGATIVE
Glucose, UA: NEGATIVE mg/dL
Hgb urine dipstick: NEGATIVE
Ketones, ur: NEGATIVE mg/dL
Nitrite: NEGATIVE
Protein, ur: NEGATIVE mg/dL
Specific Gravity, Urine: 1.023 (ref 1.005–1.030)
pH: 6 (ref 5.0–8.0)

## 2020-10-09 NOTE — Progress Notes (Signed)
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 PMH includes: anemia, depression, HTN, back pain, sleep apnea, and x2 back surgeries (fusion T2-S1)    PT Comments    Pt was seen for instruction on mobility, and is improving her ability to stand with RLE immobilizer in place.  Pt is controlling sitting with min guard and cues for management of immobilizer.  Pt is expecting to go to rehab now, and PT still recommends this given her lack of  help to go home, and lack of safety to walk alone.  Follow up with work on standing balance, safety with precautions and endurance with gait.  Follow along as her stay allows.   Recommendations for follow up therapy are one component of a multi-disciplinary discharge planning process, led by the attending physician.  Recommendations may be updated based on patient status, additional functional criteria and insurance authorization.  Follow Up Recommendations  SNF     Equipment Recommendations  None recommended by PT    Recommendations for Other Services       Precautions / Restrictions Precautions Precautions: Fall;Anterior Hip Precaution Comments: review of precautions Required Braces or Orthoses: Knee Immobilizer - Right Knee Immobilizer - Right: On at all times Restrictions Weight Bearing Restrictions: Yes RLE Weight Bearing: Weight bearing as tolerated     Mobility  Bed Mobility Overal bed mobility: Needs Assistance             General bed mobility comments: up in chair with assist from nursing    Transfers Overall transfer level: Needs assistance Equipment used: Rolling walker (2 wheeled) Transfers: Sit to/from Stand Sit to Stand: Supervision            Ambulation/Gait Ambulation/Gait  assistance: Supervision Gait Distance (Feet): 140 Feet Assistive device: Rolling walker (2 wheeled) Gait Pattern/deviations: Step-through pattern;Step-to pattern;Decreased stride length;Decreased weight shift to right Gait velocity: decreased   General Gait Details: pt is more aware of abd but not rotation   Stairs             Wheelchair Mobility    Modified Rankin (Stroke Patients Only)       Balance Overall balance assessment: Needs assistance Sitting-balance support: Feet supported Sitting balance-Leahy Scale: Good     Standing balance support: Bilateral upper extremity supported;During functional activity Standing balance-Leahy Scale: Fair Standing balance comment: less than fair dynamically                            Cognition Arousal/Alertness: Awake/alert Behavior During Therapy: WFL for tasks assessed/performed Overall Cognitive Status: Within Functional Limits for tasks assessed                                        Exercises      General Comments General comments (skin integrity, edema, etc.): pt was assisted to get to BR and walk, and noted her ability to use walker with supervision and stand up without help      Pertinent Vitals/Pain Pain Assessment: Faces Faces Pain Scale: Hurts a little bit Pain Location: R hip Pain Descriptors / Indicators: Guarding Pain Intervention(s): Monitored during session;Repositioned  Home Living                      Prior Function            PT Goals (current goals can now be found in the care plan section) Acute Rehab PT Goals Patient Stated Goal: return home with husband Progress towards PT goals: Progressing toward goals    Frequency    7X/week      PT Plan Current plan remains appropriate    Co-evaluation              AM-PAC PT "6 Clicks" Mobility   Outcome Measure  Help needed turning from your back to your side while in a flat bed without using  bedrails?: A Little Help needed moving from lying on your back to sitting on the side of a flat bed without using bedrails?: A Little Help needed moving to and from a bed to a chair (including a wheelchair)?: A Little Help needed standing up from a chair using your arms (e.g., wheelchair or bedside chair)?: A Little Help needed to walk in hospital room?: A Little Help needed climbing 3-5 steps with a railing? : A Lot 6 Click Score: 17    End of Session Equipment Utilized During Treatment: Gait belt;Right knee immobilizer Activity Tolerance: Patient tolerated treatment well Patient left: in chair;with call bell/phone within reach;with chair alarm set Nurse Communication: Mobility status PT Visit Diagnosis: Other abnormalities of gait and mobility (R26.89);Pain Pain - Right/Left: Right Pain - part of body: Hip     Time: 1400-1439 PT Time Calculation (min) (ACUTE ONLY): 39 min  Charges:  $Gait Training: 8-22 mins $Therapeutic Activity: 23-37 mins          Ramond Dial 10/09/2020, 8:19 PM  Mee Hives, PT MS Acute Rehab Dept. Number: [place] and [place]

## 2020-10-09 NOTE — Progress Notes (Signed)
PT Cancellation Note  Patient Details Name: [name] MRN: [id number] DOB: 07/29/1945   Cancelled Treatment:    Reason Eval/Treat Not Completed: Fatigue/lethargy limiting ability to participate.  States she has been up every hour overnight.  Follow up as time and pt allow.   Ramond Dial 10/09/2020, 12:49 PM  Mee Hives, PT MS Acute Rehab Dept. Number: [place] and [place]

## 2020-10-09 NOTE — Progress Notes (Signed)
Patient ID: [name], female   DOB: Apr 10, 1945, 74 y.o.   MRN: [id number] The patient is struggling some with her pain management.  She is also been having significant urine frequency.  Her H&H is stable.  Her white blood cell count is a little elevated.  I will send off urinalysis.  Upon reading the notes and speaking with the patient as well as reviewing physical therapy's notes, short-term skilled nursing has been recommended.  The patient's husband is at home but he is more elderly and is unable to fully take care of her in terms of helping her with her mobility.  The power is also out completely at their house.  A consult has been put in with the transitional care team for an FL 2 for her chart and short-term skilled nursing placement which will likely not happen till early next week.  I did change her right hip dressing and it is stable.

## 2020-10-09 NOTE — Plan of Care (Signed)

## 2020-10-09 NOTE — Plan of Care (Signed)
  Problem: Activity: Goal: Risk for activity intolerance will decrease Outcome: Progressing   Problem: Pain Managment: Goal: General experience of comfort will improve Outcome: Progressing   Problem: Safety: Goal: Ability to remain free from injury will improve Outcome: Progressing   Problem: Skin Integrity: Goal: Risk for impaired skin integrity will decrease Outcome: Progressing   Problem: Education: Goal: Knowledge of General Education information will improve Description: Including pain rating scale, medication(s)/side effects and non-pharmacologic comfort measures Outcome: Progressing

## 2020-10-10 LAB — CBC
HCT: 33.9 % — ABNORMAL LOW (ref 36.0–46.0)
Hemoglobin: 11.1 g/dL — ABNORMAL LOW (ref 12.0–15.0)
MCH: 29.7 pg (ref 26.0–34.0)
MCHC: 32.7 g/dL (ref 30.0–36.0)
MCV: 90.6 fL (ref 80.0–100.0)
Platelets: 211 10*3/uL (ref 150–400)
RBC: 3.74 MIL/uL — ABNORMAL LOW (ref 3.87–5.11)
RDW: 13.2 % (ref 11.5–15.5)
WBC: 12 10*3/uL — ABNORMAL HIGH (ref 4.0–10.5)
nRBC: 0 % (ref 0.0–0.2)

## 2020-10-10 LAB — GLUCOSE, CAPILLARY: Glucose-Capillary: 162 mg/dL — ABNORMAL HIGH (ref 70–99)

## 2020-10-10 NOTE — Progress Notes (Signed)
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 PMH includes: anemia, depression, HTN, back pain, sleep apnea, and x2 back surgeries (fusion T2-S1)    PT Comments    Pt progressing towards goals, however, mobility limited to room this session as pt reporting increased fatigue. Was able to ambulate and go to bathroom with RW and min to min guard A. Reviewed hip precautions with pt. Current recommendations appropriate. Will continue to follow acutely.     Recommendations for follow up therapy are one component of a multi-disciplinary discharge planning process, led by the attending physician.  Recommendations may be updated based on patient status, additional functional criteria and insurance authorization.  Follow Up Recommendations  SNF     Equipment Recommendations  None recommended by PT    Recommendations for Other Services       Precautions / Restrictions Precautions Precautions: Fall;Anterior Hip Precaution Booklet Issued: No Precaution Comments: Reviewed anterior precautions with pt Required Braces or Orthoses: Knee Immobilizer - Right Knee Immobilizer - Right: On at all times Restrictions Weight Bearing Restrictions: Yes RLE Weight Bearing: Weight bearing as tolerated     Mobility  Bed Mobility Overal bed mobility: Needs Assistance Bed Mobility: Supine to Sit     Supine to sit: Min assist Sit to supine: Min assist   General bed mobility comments: Required assist for RLE and trunk. Increased time required.    Transfers Overall transfer level: Needs assistance Equipment used: Rolling walker (2 wheeled) Transfers: Sit to/from Stand Sit to Stand: Min guard         General transfer comment: Min guard for safety from  elevated bed height.  Ambulation/Gait Ambulation/Gait assistance: Min guard Gait Distance (Feet): 25 Feet Assistive device: Rolling walker (2 wheeled) Gait Pattern/deviations: Step-through pattern;Step-to pattern;Decreased stride length;Decreased weight shift to right;Antalgic Gait velocity: decreased   General Gait Details: Cues for sequencing and maintenance of precautions. Min guard for safety. Mobility limited to room as pt reporting increased fatigue.   Stairs             Wheelchair Mobility    Modified Rankin (Stroke Patients Only)       Balance Overall balance assessment: Needs assistance Sitting-balance support: Feet supported Sitting balance-Leahy Scale: Good     Standing balance support: Bilateral upper extremity supported;During functional activity Standing balance-Leahy Scale: Poor Standing balance comment: Reliant on BUE support                            Cognition Arousal/Alertness: Awake/alert Behavior During Therapy: WFL for tasks assessed/performed Overall Cognitive Status: Within Functional Limits for tasks assessed                                        Exercises      General Comments        Pertinent Vitals/Pain Pain Assessment: Faces Faces Pain Scale: Hurts little more Pain Location: R hip Pain Descriptors / Indicators: Guarding Pain Intervention(s): Limited activity within patient's tolerance;Monitored during session;Repositioned    Home Living  Prior Function            PT Goals (current goals can now be found in the care plan section) Acute Rehab PT Goals Patient Stated Goal: to go home PT Goal Formulation: With patient Time For Goal Achievement: 10/21/20 Potential to Achieve Goals: Good Progress towards PT goals: Progressing toward goals    Frequency    7X/week      PT Plan Current plan remains appropriate    Co-evaluation              AM-PAC PT  "6 Clicks" Mobility   Outcome Measure  Help needed turning from your back to your side while in a flat bed without using bedrails?: A Little Help needed moving from lying on your back to sitting on the side of a flat bed without using bedrails?: A Little Help needed moving to and from a bed to a chair (including a wheelchair)?: A Little Help needed standing up from a chair using your arms (e.g., wheelchair or bedside chair)?: A Little Help needed to walk in hospital room?: A Little Help needed climbing 3-5 steps with a railing? : A Lot 6 Click Score: 17    End of Session Equipment Utilized During Treatment: Gait belt;Right knee immobilizer Activity Tolerance: Patient limited by fatigue Patient left: in chair;with call bell/phone within reach;with chair alarm set Nurse Communication: Mobility status PT Visit Diagnosis: Other abnormalities of gait and mobility (R26.89);Pain Pain - Right/Left: Right Pain - part of body: Hip     Time: 5625-6389 PT Time Calculation (min) (ACUTE ONLY): 19 min  Charges:  $Therapeutic Activity: 8-22 mins                     Lou Miner, DPT  Acute Rehabilitation Services  Pager: [phone] Office: [phone]    Rudean Hitt 10/10/2020, 2:34 PM

## 2020-10-10 NOTE — Progress Notes (Signed)
Subjective: 3 Days Post-Op Procedure(s) (LRB): RIGHT HIP REVISION OF  HIP BALL/POLY-LINER (Right) Patient reports pain as mild.  Pain much better controlled  Objective: Vital signs in last 24 hours: Temp:  [98.1 F (36.7 C)-99.1 F (37.3 C)] 99.1 F (37.3 C) (10/01 2000) Pulse Rate:  [79-81] 81 (10/01 2000) Resp:  [17-18] 18 (10/01 2000) BP: (113-127)/(69-72) 113/72 (10/01 2000) SpO2:  [93 %-100 %] 93 % (10/01 2000)  Intake/Output from previous day: 10/01 0701 - 10/02 0700 In: 480 [P.O.:480] Out: -  Intake/Output this shift: No intake/output data recorded.  Recent Labs    10/08/20 0256 10/09/20 0224 10/10/20 0352  HGB 11.5* 11.1* 11.1*   Recent Labs    10/09/20 0224 10/10/20 0352  WBC 13.2* 12.0*  RBC 3.81* 3.74*  HCT 35.1* 33.9*  PLT 204 211   Recent Labs    10/08/20 0256  NA 135  K 3.7  CL 105  CO2 24  BUN 11  CREATININE 0.80  GLUCOSE 128*  CALCIUM 8.3*   No results for input(s): LABPT, INR in the last 72 hours.  Neurologically intact Neurovascular intact Sensation intact distally Intact pulses distally Dorsiflexion/Plantar flexion intact Incision: dressing C/D/I No cellulitis present Compartment soft   Assessment/Plan: 3 Days Post-Op Procedure(s) (LRB): RIGHT HIP REVISION OF  HIP BALL/POLY-LINER (Right) Advance diet Up with therapy D/C IV fluids Discharge to SNF once bed available and insurance approved WBAT RLE Knee immobilizer to remind her of anterior hip precautions- no hip abduction or external rotation      Aundra Dubin 10/10/2020, 9:54 AM

## 2020-10-11 MED ORDER — METHOCARBAMOL 500 MG PO TABS: 500.0000 mg | ORAL_TABLET | Freq: Four times a day (QID) | ORAL | 0 refills | Status: DC | PRN

## 2020-10-11 MED ORDER — ASPIRIN 81 MG PO CHEW: 81.0000 mg | CHEWABLE_TABLET | Freq: Two times a day (BID) | ORAL | 0 refills | Status: DC

## 2020-10-11 MED ORDER — BISACODYL 10 MG RE SUPP
10.0000 mg | Freq: Every day | RECTAL | Status: DC | PRN
  Administered 2020-10-11: 10 mg via RECTAL
  Filled 2020-10-11: qty 1

## 2020-10-11 MED ORDER — HYDROCODONE-ACETAMINOPHEN 5-325 MG PO TABS: 1.0000 | ORAL_TABLET | ORAL | 0 refills | Status: DC | PRN

## 2020-10-11 NOTE — Plan of Care (Signed)

## 2020-10-11 NOTE — Plan of Care (Signed)

## 2020-10-11 NOTE — Care Management Important Message (Signed)
Important Message  Patient Details  Name: [name] MRN: [id number] Date of Birth: [birth year]   Medicare Important Message Given:  Yes     Desha Bitner 10/11/2020, 4:12 PM

## 2020-10-11 NOTE — TOC Initial Note (Signed)
Transition of Care [facility]) - Initial/Assessment Note    Patient Details  Name: [name] MRN: [id number] Date of Birth: 29-Oct-1945  Transition of Care [facility]) CM/SW Contact:    Milinda Antis, [place] Phone Number: 10/11/2020, 2:57 PM  Clinical Narrative:                 CSW received consult for possible SNF placement at time of discharge. CSW spoke with patient. Patient reported that patient's spouse is currently unable to care for patient at their home given patient's current physical needs and fall risk. Patient expressed understanding of PT recommendation and is agreeable to SNF placement at time of discharge. Patient reports preference for a facility in [place].  CSW discussed insurance authorization process and will provide Medicare SNF ratings list. Patient reports receiving 3 COVID vaccines.  No further questions reported at this time.   Skilled Nursing Rehab Facilities-   [email or web address] Ratings out of 5 possible    Name Address  Phone # [place] Inspection Overall  [facility] [address], [place] 5 1 4 4   [facility]  [address] [place], [place] [phone] 3 1 5 4   [facility] [place], [place] 3 1 1 1   [place], [place] 2 2 4 4   [facility] [address], [place] 3 1 2 1   [place] 3 2 4 4   [facility] [address], [place] 5 1 2 2   [facility] [address] [place] 5 2 2 3   [facility] [place], [place] [phone] 5 1 2 2   [facility] [phone] [address], [place] [phone] 5 1 2 2   [facility] [address] [facility] [phone] 5 1 2 2   [facility] [address] [place] [phone] 3 1 1 1       Expected Discharge Plan: Skilled Nursing Facility Barriers to Discharge: SNF Pending bed offer   Patient Goals and CMS Choice Patient states their goals for this hospitalization and ongoing recovery are:: To return home to husband CMS Medicare.gov Compare Post Acute Care list provided to:: Patient Choice offered to / list presented to : Patient  Expected Discharge Plan and Services Expected Discharge Plan: [place]   Discharge Planning Services: CM Consult   Living arrangements for the past 2 months: Single Family Home Expected Discharge Date: 10/11/20                         HH Arranged: PT HH Agency: [place] Date [place]: 10/07/20 Time [place]: 1552 Representative spoke with at [place]: [place] Arrangements/Services Living arrangements for the past 2 months: [place] with:: Spouse Patient language and need for interpreter reviewed:: Yes Do you feel safe going back to the place where you live?: Yes      Need for Family Participation in Patient Care: Yes (Comment) Care giver support system in place?: Yes (comment) Current home services: DME (cane, BSC, RW) Criminal Activity/Legal Involvement Pertinent to Current Situation/Hospitalization: No - Comment as needed  Activities of Daily Living Home Assistive Devices/Equipment: Eyeglasses, Cane (specify quad or straight) ADL Screening (condition at time of admission) Patient's cognitive ability adequate to safely complete daily activities?: Yes Is the patient deaf or have difficulty hearing?:  No Does the patient have difficulty seeing, even when wearing glasses/contacts?: No Does the patient have difficulty concentrating, remembering, or making decisions?: No Patient able to express need for assistance with ADLs?: Yes Does the patient have difficulty dressing or bathing?: No Independently performs ADLs?: Yes (appropriate for developmental  age) Does the patient have difficulty walking or climbing stairs?: Yes Weakness of Legs: Right Weakness of Arms/Hands: None  Permission Sought/Granted   Permission granted to share information with : Yes, Verbal Permission Granted  Share Information with NAME: Amani Marseille (Spouse) [phone]  Permission granted to share info w AGENCY: SNF        Emotional Assessment Appearance:: Appears stated age Attitude/Demeanor/Rapport: Engaged Affect (typically observed): Pleasant Orientation: : Oriented to Situation, Oriented to  Time, Oriented to Place, Oriented to Self Alcohol / Substance Use: Not Applicable Psych Involvement: No (comment)  Admission diagnosis:  History of revision of total replacement of right hip joint [Z96.641] Patient Active Problem List   Diagnosis Date Noted   Leg length discrepancy, Right 10/07/2020   History of revision of total replacement of right hip joint 10/07/2020   Multinodular goiter 05/18/2020   Hip contracture, unspecified laterality 07/02/2018   Overweight (BMI 25.0-29.9) 05/09/2017   S/P right THA, AA 05/08/2017   Chronic bilateral low back pain without sciatica 04/05/2015   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

## 2020-10-11 NOTE — NC FL2 (Signed)
[place] LEVEL OF CARE SCREENING TOOL     IDENTIFICATION  Patient Name: [name] Birthdate: 10-Aug-1945 Sex: female Admission Date (Current Location): 10/07/2020  [facility] and [place] Number:  Herbalist and Address:  [facility] [place] [place] [address] [place], [place] [address]      Provider Number: [id number]  Attending Physician Name and Address:  Mcarthur Rossetti,*  Relative Name and Phone Number:  Marlette, [place] (Spouse)   [phone]    Current Level of Care: Hospital Recommended Level of Care: [place] Prior Approval Number:    Date Approved/Denied:   PASRR Number: [id number]  Discharge Plan: SNF    Current Diagnoses: Patient Active Problem List   Diagnosis Date Noted   Leg length discrepancy, Right 10/07/2020   History of revision of total replacement of right hip joint 10/07/2020   Multinodular goiter 05/18/2020   Hip contracture, unspecified laterality 07/02/2018   Overweight (BMI 25.0-29.9) 05/09/2017   S/P right THA, AA 05/08/2017   Chronic bilateral low back pain without sciatica 04/05/2015   Disorder of sacroiliac joint 12/22/2014   Chronic low back pain 12/22/2014   Acquired scoliosis 11/27/2011   Lumbosacral spondylosis without myelopathy 05/22/2011    Orientation RESPIRATION BLADDER Height & Weight     Place, Situation, Time, Self  Normal Continent Weight: 181 lb (82.1 kg) Height:  5\' 5"  (165.1 cm)  BEHAVIORAL SYMPTOMS/MOOD NEUROLOGICAL BOWEL NUTRITION STATUS      Continent Diet (see d/c summary)  AMBULATORY STATUS COMMUNICATION OF NEEDS Skin   Limited Assist Verbally Surgical wounds                       Personal Care Assistance Level of Assistance  Bathing, Feeding, Dressing Bathing Assistance: Limited assistance Feeding assistance: Independent Dressing Assistance: Limited assistance     Functional Limitations Info  Speech, Hearing, Sight Sight Info:  Adequate Hearing Info: Adequate Speech Info: Adequate    SPECIAL CARE FACTORS FREQUENCY  PT (By licensed PT), OT (By licensed OT)     PT Frequency: 5x/ week OT Frequency: 5x/week            Contractures Contractures Info: Not present    Additional Factors Info  Code Status, Allergies Code Status Info: Full Allergies Info: Lisinopril           Current Medications (10/11/2020):  This is the current hospital active medication list Current Facility-Administered Medications  Medication Dose Route Frequency Provider Last Rate Last Admin   0.9 %  sodium chloride infusion   Intravenous Continuous Mcarthur Rossetti, MD 75 mL/hr at 10/07/20 1338 New Bag at 10/07/20 1338   acetaminophen (TYLENOL) tablet 325-650 mg  325-650 mg Oral Q6H PRN Mcarthur Rossetti, MD       alum & mag hydroxide-simeth (MAALOX/MYLANTA) 200-200-20 MG/5ML suspension 30 mL  30 mL Oral Q4H PRN Mcarthur Rossetti, MD       aspirin chewable tablet 81 mg  81 mg Oral BID Mcarthur Rossetti, MD   81 mg at 10/11/20 0900   bisacodyl (DULCOLAX) suppository 10 mg  10 mg Rectal Daily PRN Mcarthur Rossetti, MD   10 mg at 10/11/20 1152   buPROPion (WELLBUTRIN XL) 24 hr tablet 450 mg  450 mg Oral Daily Mcarthur Rossetti, MD   450 mg at 10/11/20 0900   diphenhydrAMINE (BENADRYL) 12.5 MG/5ML elixir 12.5-25 mg  12.5-25 mg Oral Q4H PRN Mcarthur Rossetti, MD  docusate sodium (COLACE) capsule 100 mg  100 mg Oral BID Mcarthur Rossetti, MD   100 mg at 10/11/20 0900   gabapentin (NEURONTIN) capsule 600 mg  600 mg Oral QHS Mcarthur Rossetti, MD   600 mg at 10/10/20 2249   HYDROcodone-acetaminophen (NORCO/VICODIN) 5-325 MG per tablet 1-2 tablet  1-2 tablet Oral Q4H PRN Mcarthur Rossetti, MD   2 tablet at 10/11/20 0700   HYDROmorphone (DILAUDID) injection 0.5-1 mg  0.5-1 mg Intravenous Q4H PRN Mcarthur Rossetti, MD       losartan (COZAAR) tablet 50 mg  50 mg Oral Daily  Mcarthur Rossetti, MD   50 mg at 10/11/20 0900   menthol-cetylpyridinium (CEPACOL) lozenge 3 mg  1 lozenge Oral PRN Mcarthur Rossetti, MD       Or   phenol (CHLORASEPTIC) mouth spray 1 spray  1 spray Mouth/Throat PRN Mcarthur Rossetti, MD       methocarbamol (ROBAXIN) tablet 500 mg  500 mg Oral Q6H PRN Mcarthur Rossetti, MD   500 mg at 10/11/20 0222   Or   methocarbamol (ROBAXIN) 500 mg in dextrose 5 % 50 mL IVPB  500 mg Intravenous Q6H PRN Mcarthur Rossetti, MD       metoCLOPramide (REGLAN) tablet 5-10 mg  5-10 mg Oral Q8H PRN Mcarthur Rossetti, MD       Or   metoCLOPramide (REGLAN) injection 5-10 mg  5-10 mg Intravenous Q8H PRN Mcarthur Rossetti, MD       ondansetron [facility]) tablet 4 mg  4 mg Oral Q6H PRN Mcarthur Rossetti, MD       Or   ondansetron [facility]) injection 4 mg  4 mg Intravenous Q6H PRN Mcarthur Rossetti, MD       oxyCODONE (Oxy IR/ROXICODONE) immediate release tablet 10-15 mg  10-15 mg Oral Q4H PRN Mcarthur Rossetti, MD   15 mg at 10/11/20 0345   oxyCODONE (Oxy IR/ROXICODONE) immediate release tablet 5-10 mg  5-10 mg Oral Q4H PRN Mcarthur Rossetti, MD   5 mg at 10/07/20 2119   pantoprazole (PROTONIX) EC tablet 40 mg  40 mg Oral Daily Mcarthur Rossetti, MD   40 mg at 10/11/20 0900   polyethylene glycol (MIRALAX / GLYCOLAX) packet 17 g  17 g Oral Daily PRN Mcarthur Rossetti, MD   17 g at 10/10/20 9935   rosuvastatin (CRESTOR) tablet 20 mg  20 mg Oral Daily Henri Medal, RPH   20 mg at 10/11/20 0900     Discharge Medications: Please see discharge summary for a list of discharge medications.  Relevant Imaging Results:  Relevant Lab Results:   Additional Information SSN: [id number];  Patient reports COVID vaccination and 1 booster,  5'5" 181lbs  [name] F Jamica Woodyard, LCSWA

## 2020-10-11 NOTE — Progress Notes (Signed)
Patient ID: [name], female   DOB: 25-Dec-1945, 75 y.o.   MRN: [id number] There has been no acute changes over the last 24 hours.  The patient's vital signs are stable.  She does have more pain at night.  This morning her right hip is stable.  From medical and orthopedic standpoint she can be discharged to short-term skilled nursing.  The transitional care team has been consulted.  She needs to have an FL 2 on the chart.  I will go ahead and put in her discharge and medications so as to help progress things along.

## 2020-10-11 NOTE — Progress Notes (Signed)
Physical Therapy Treatment Patient Details Name: [name] MRN: [id number] DOB: [birth year] Today's Date: 10/11/2020   History of Present Illness The pt is a 75 yo female presenting 9/29 for R THA revision due to leg length discrepancy (R<L) causing balance deficits and back pain. PMH includes: anemia, depression, HTN, back pain, sleep apnea, and x2 back surgeries (fusion T2-S1)    PT Comments    Patient progressing well towards PT goals. Reports feeling tired and not sleeping much last night due to uncontrolled pain. Requires Mod A for bed mobility and Min A for transfers due to weakness. Able to recall hip precautions but needs cues to adhere to them during functional mobility/tasks. Improved ambulation distance with use of RW for support. Continues to be appropriate for SNF as pt does not have support at home and would benefit from strengthening, overall mobility and safety. Will follow acutely.    Recommendations for follow up therapy are one component of a multi-disciplinary discharge planning process, led by the attending physician.  Recommendations may be updated based on patient status, additional functional criteria and insurance authorization.  Follow Up Recommendations  SNF     Equipment Recommendations  None recommended by PT    Recommendations for Other Services       Precautions / Restrictions Precautions Precautions: Fall;Anterior Hip Precaution Booklet Issued: No Precaution Comments: Reviewed anterior precautions with pt Required Braces or Orthoses: Knee Immobilizer - Right Knee Immobilizer - Right: On at all times Restrictions Weight Bearing Restrictions: Yes RLE Weight Bearing: Weight bearing as tolerated Other Position/Activity Restrictions: "strict anterior hip precautions with no external rotation or abduction of   that right hip" per op note     Mobility  Bed Mobility Overal bed mobility: Needs Assistance Bed Mobility: Supine to Sit     Supine  to sit: Mod assist;HOB elevated     General bed mobility comments: ABle to bring LEs to EOB but needs help with trunk to sit upright. Increased time. Reviewed which side of bed to get out on and to adhere to precautions.    Transfers Overall transfer level: Needs assistance Equipment used: Rolling walker (2 wheeled) Transfers: Sit to/from Stand Sit to Stand: Min guard         General transfer comment: Min guard for safety from elevated bed height. Needs assist from standard or lower bed height and cues for hand placement. Transferred to chair post ambulation  Ambulation/Gait Ambulation/Gait assistance: Min guard Gait Distance (Feet): 130 Feet Assistive device: Rolling walker (2 wheeled) Gait Pattern/deviations: Step-through pattern;Decreased stride length;Decreased weight shift to right;Antalgic Gait velocity: decreased   General Gait Details: SLow, antalgic like gait with cues for upright and sequencing as well as to adhere to precautions esp with turns. Multiple standing rest breaks. Fatigues.   Stairs             Wheelchair Mobility    Modified Rankin (Stroke Patients Only)       Balance Overall balance assessment: Needs assistance Sitting-balance support: Feet supported;No upper extremity supported Sitting balance-Leahy Scale: Good     Standing balance support: During functional activity Standing balance-Leahy Scale: Fair Standing balance comment: Reliant on BUE support for dynamic tasks but needs UE support for walking.                            Cognition Arousal/Alertness: Awake/alert Behavior During Therapy: WFL for tasks assessed/performed Overall Cognitive Status: Within Functional Limits for tasks assessed  General Comments: reports losing train of thought when talking at times and memory issues as baseline.      Exercises General Exercises - Lower Extremity Ankle Circles/Pumps:  AROM;Both;5 reps;Supine Quad Sets: AROM;Right;5 reps;Supine    General Comments        Pertinent Vitals/Pain Pain Assessment: Faces Faces Pain Scale: Hurts little more Pain Location: righ quad Pain Descriptors / Indicators: Aching;Dull Pain Intervention(s): Monitored during session;Repositioned    Home Living                      Prior Function            PT Goals (current goals can now be found in the care plan section) Progress towards PT goals: Progressing toward goals    Frequency    7X/week      PT Plan Current plan remains appropriate    Co-evaluation              AM-PAC PT "6 Clicks" Mobility   Outcome Measure  Help needed turning from your back to your side while in a flat bed without using bedrails?: A Little Help needed moving from lying on your back to sitting on the side of a flat bed without using bedrails?: A Lot Help needed moving to and from a bed to a chair (including a wheelchair)?: A Little Help needed standing up from a chair using your arms (e.g., wheelchair or bedside chair)?: A Little Help needed to walk in hospital room?: A Little Help needed climbing 3-5 steps with a railing? : A Lot 6 Click Score: 16    End of Session Equipment Utilized During Treatment: Gait belt;Right knee immobilizer Activity Tolerance: Patient tolerated treatment well Patient left: in chair;with call bell/phone within reach Nurse Communication: Mobility status PT Visit Diagnosis: Other abnormalities of gait and mobility (R26.89);Pain Pain - Right/Left: Right Pain - part of body: Hip;Leg     Time: 2563-8937 PT Time Calculation (min) (ACUTE ONLY): 38 min  Charges:  $Gait Training: 23-37 mins $Therapeutic Activity: 8-22 mins                     Marisa Severin, PT, DPT Acute Rehabilitation Services Pager [phone] Office [place] 10/11/2020, 9:53 AM

## 2020-10-11 NOTE — Discharge Summary (Signed)
Patient ID: [name] MRN: [id number] DOB/AGE: [birth year] 75 y.o.  Admit date: 10/07/2020 Discharge date: 10/11/2020  Admission Diagnoses:  Principal Problem:   Leg length discrepancy, Right Active Problems:   History of revision of total replacement of right hip joint   Discharge Diagnoses:  Same  Past Medical History:  Diagnosis Date   Anemia    Congenital musculoskeletal deformity of spine    Depression    Headache    Hypertension    Incontinence of bowel 09/2013   Lumbago    Lumbosacral spondylosis without myelopathy    Sleep apnea    Sleep disorder    uses gabapentin at bedtime    Spinal stenosis, lumbar region, without neurogenic claudication     Surgeries: Procedure(s): RIGHT HIP REVISION OF  HIP BALL/POLY-LINER on 10/07/2020   Consultants:   Discharged Condition: Improved  Hospital Course: [name] is an 75 y.o. female who was admitted 10/07/2020 for operative treatment ofLeg length discrepancy. Patient has severe unremitting pain that affects sleep, daily activities, and work/hobbies. After pre-op clearance the patient was taken to the operating room on 10/07/2020 and underwent  Procedure(s): RIGHT HIP REVISION OF  HIP BALL/POLY-LINER.    Patient was given perioperative antibiotics:  Anti-infectives (From admission, onward)    Start     Dose/Rate Route Frequency Ordered Stop   10/07/20 1330  ceFAZolin (ANCEF) IVPB 1 g/50 mL premix        1 g 100 mL/hr over 30 Minutes Intravenous Every 6 hours 10/07/20 1244 10/07/20 2152   10/07/20 0714  ceFAZolin (ANCEF) 2-4 GM/100ML-% IVPB       Note to Pharmacy: Valda Lamb   : cabinet override      10/07/20 0714 10/07/20 1929        Patient was given sequential compression devices, early ambulation, and chemoprophylaxis to prevent DVT.  Patient benefited maximally from hospital stay and there were no complications.    Recent vital signs: Patient Vitals for the past 24 hrs:  BP Temp Temp src Pulse  Resp SpO2  10/10/20 2030 125/84 98.2 F (36.8 C) Oral 86 20 97 %     Recent laboratory studies:  Recent Labs    10/09/20 0224 10/10/20 0352  WBC 13.2* 12.0*  HGB 11.1* 11.1*  HCT 35.1* 33.9*  PLT 204 211     Discharge Medications:   Allergies as of 10/11/2020       Reactions   Lisinopril Cough        Medication List     TAKE these medications    aspirin 81 MG chewable tablet Chew 1 tablet (81 mg total) by mouth 2 (two) times daily.   Biofreeze 10 % Liqd Generic drug: Menthol (Topical Analgesic) Apply 1 application topically daily as needed (pain).   buPROPion 150 MG 24 hr tablet Commonly known as: WELLBUTRIN XL Take 450 mg by mouth daily.   docusate sodium 50 MG capsule Commonly known as: COLACE Take 50 mg by mouth daily.   gabapentin 600 MG tablet Commonly known as: NEURONTIN Take 1 tablet (600 mg total) by mouth daily. 3 tabs QHS What changed:  when to take this additional instructions   HYDROcodone-acetaminophen 5-325 MG tablet Commonly known as: NORCO/VICODIN Take 1-2 tablets by mouth every 4 (four) hours as needed for moderate pain.   losartan 50 MG tablet Commonly known as: COZAAR Take 50 mg by mouth daily.   methocarbamol 750 MG tablet Commonly known as: ROBAXIN Take 750 mg by mouth daily  as needed for muscle spasms. What changed: Another medication with the same name was added. Make sure you understand how and when to take each.   methocarbamol 500 MG tablet Commonly known as: ROBAXIN Take 1 tablet (500 mg total) by mouth every 6 (six) hours as needed for muscle spasms. What changed: You were already taking a medication with the same name, and this prescription was added. Make sure you understand how and when to take each.   naproxen sodium 220 MG tablet Commonly known as: ALEVE Take 440 mg by mouth daily as needed (headache).   rosuvastatin 20 MG tablet Commonly known as: CRESTOR Take 20 mg by mouth at bedtime.   Vitamin D  (Ergocalciferol) 1.25 MG (50000 UNIT) Caps capsule Commonly known as: DRISDOL Take 50,000 Units by mouth every Thursday.               Durable Medical Equipment  (From admission, onward)           Start     Ordered   10/07/20 1245  DME 3 n 1  Once        10/07/20 1244   10/07/20 1245  DME Walker rolling  Once       Question Answer Comment  Walker: With 5 Inch Wheels   Patient needs a walker to treat with the following condition Status post revision of total hip      10/07/20 1244            Diagnostic Studies: DG Pelvis Portable  Result Date: 10/07/2020 CLINICAL DATA:  Right hip replacement EXAM: PORTABLE PELVIS 1-2 VIEWS COMPARISON:  Intraoperative imaging today FINDINGS: Changes of right hip replacement. Normal AP alignment. No hardware bony complicating feature. Remote postoperative changes in the lumbar spine. IMPRESSION: Changes of right hip replacement.  No visible complicating feature. Electronically Signed   By: Rolm Baptise M.D.   On: 10/07/2020 10:25   DG HIP OPERATIVE UNILAT WITH PELVIS RIGHT  Result Date: 10/07/2020 CLINICAL DATA:  Right hip revision EXAM: OPERATIVE RIGHT HIP (WITH PELVIS IF PERFORMED) 6 VIEWS TECHNIQUE: Fluoroscopic spot image(s) were submitted for interpretation post-operatively. COMPARISON:  05/08/2017 FINDINGS: Changes of right hip replacement/revision. Normal AP alignment. No hardware bony complicating feature. IMPRESSION: Right hip replacement/revision.  No complicating feature. Electronically Signed   By: Rolm Baptise M.D.   On: 10/07/2020 10:26    Disposition: Discharge disposition: 03-Skilled [place] Follow up.   Specialty: [facility] Contact information: [place] [address] [phone]         Mcarthur Rossetti, MD Follow up in 2 week(s).   Specialty: Orthopedic Surgery Contact information: [address] [place] [address] [phone]                  Signed: Mcarthur Rossetti 10/11/2020, 7:38 AM

## 2020-10-12 LAB — GLUCOSE, CAPILLARY: Glucose-Capillary: 118 mg/dL — ABNORMAL HIGH (ref 70–99)

## 2020-10-12 LAB — SARS CORONAVIRUS 2 (TAT 6-24 HRS): SARS Coronavirus 2: NEGATIVE

## 2020-10-12 MED ORDER — METHOCARBAMOL 500 MG PO TABS: 500.0000 mg | ORAL_TABLET | Freq: Four times a day (QID) | ORAL | 1 refills | Status: DC | PRN

## 2020-10-12 MED ORDER — HYDROCODONE-ACETAMINOPHEN 5-325 MG PO TABS: 1.0000 | ORAL_TABLET | ORAL | 0 refills | Status: DC | PRN

## 2020-10-12 MED ORDER — ASPIRIN 81 MG PO CHEW: 81.0000 mg | CHEWABLE_TABLET | Freq: Two times a day (BID) | ORAL | 0 refills | Status: DC

## 2020-10-12 NOTE — Progress Notes (Signed)
Physical Therapy Treatment Patient Details Name: [name] MRN: [id number] DOB: 02-04-1945 Today's Date: 10/12/2020   History of Present Illness The pt is a 75 yo female presenting 9/29 for R THA revision due to leg length discrepancy (R<L) causing balance deficits and back pain. PMH includes: anemia, depression, HTN, back pain, sleep apnea, and x2 back surgeries (fusion T2-S1)    PT Comments    Patient progressing well with mobility. Reports she got she sleep last night and pain is controlled. Still concerned about going home re: getting in/out of bed, being able to perform IADLs/ADLs safely, fatigue/endurance etc. Discussed concerns about going home. Will continue to work on bed mobility simulating home setup while maintaining hip precautions next session. Worked on functional strengthening this session. Will follow.   Recommendations for follow up therapy are one component of a multi-disciplinary discharge planning process, led by the attending physician.  Recommendations may be updated based on patient status, additional functional criteria and insurance authorization.  Follow Up Recommendations  SNF     Equipment Recommendations  None recommended by PT    Recommendations for Other Services       Precautions / Restrictions Precautions Precautions: Fall;Anterior Hip Precaution Booklet Issued: No Precaution Comments: Reviewed anterior precautions with pt Required Braces or Orthoses: Knee Immobilizer - Right Knee Immobilizer - Right: On at all times Restrictions Weight Bearing Restrictions: Yes RLE Weight Bearing: Weight bearing as tolerated Other Position/Activity Restrictions: "strict anterior hip precautions with no external rotation or abduction of   that right hip" per op note     Mobility  Bed Mobility Overal bed mobility: Needs Assistance Bed Mobility: Supine to Sit     Supine to sit: Min guard;HOB elevated     General bed mobility comments: ABle to hook  LLE under RLE to bring LEs to EOB and use of rail to elevate trunk. Lowered HOB bed to simulate home as pt sleeps on pillows.    Transfers Overall transfer level: Needs assistance Equipment used: Rolling walker (2 wheeled) Transfers: Sit to/from Stand Sit to Stand: Min guard         General transfer comment: Min guard for safety. Able to stand from EOB x1, Transferred to chair post ambulation for breakfast.  Ambulation/Gait Ambulation/Gait assistance: Min guard Gait Distance (Feet): 10 Feet Assistive device: Rolling walker (2 wheeled) Gait Pattern/deviations: Step-through pattern;Decreased stride length;Decreased weight shift to right;Antalgic Gait velocity: decreased   General Gait Details: Slow, antalgic like gait with use of RW. limited as pt wanting to sit up and eat breakfast. Just got back from walking to bathroom.   Stairs             Wheelchair Mobility    Modified Rankin (Stroke Patients Only)       Balance Overall balance assessment: Needs assistance Sitting-balance support: Feet supported;No upper extremity supported Sitting balance-Leahy Scale: Good     Standing balance support: During functional activity Standing balance-Leahy Scale: Fair Standing balance comment: Reliant on BUE support for dynamic tasks                            Cognition Arousal/Alertness: Awake/alert Behavior During Therapy: WFL for tasks assessed/performed Overall Cognitive Status: Within Functional Limits for tasks assessed  Exercises Other Exercises Other Exercises: Sit to stand x6 from chair with emphasize on slow descent for strengthening    General Comments        Pertinent Vitals/Pain Pain Assessment: Faces Faces Pain Scale: Hurts a little bit Pain Location: right quad Pain Descriptors / Indicators: Aching;Dull Pain Intervention(s): Monitored during session    Home Living                       Prior Function            PT Goals (current goals can now be found in the care plan section) Progress towards PT goals: Progressing toward goals    Frequency    7X/week      PT Plan Current plan remains appropriate    Co-evaluation              AM-PAC PT "6 Clicks" Mobility   Outcome Measure  Help needed turning from your back to your side while in a flat bed without using bedrails?: A Little Help needed moving from lying on your back to sitting on the side of a flat bed without using bedrails?: A Little Help needed moving to and from a bed to a chair (including a wheelchair)?: A Little Help needed standing up from a chair using your arms (e.g., wheelchair or bedside chair)?: A Little Help needed to walk in hospital room?: A Little Help needed climbing 3-5 steps with a railing? : A Lot 6 Click Score: 17    End of Session Equipment Utilized During Treatment: Gait belt;Right knee immobilizer Activity Tolerance: Patient tolerated treatment well Patient left: in chair;with call bell/phone within reach Nurse Communication: Mobility status PT Visit Diagnosis: Other abnormalities of gait and mobility (R26.89);Pain Pain - Right/Left: Right Pain - part of body: Leg     Time: 7741-2878 PT Time Calculation (min) (ACUTE ONLY): 20 min  Charges:  $Therapeutic Activity: 8-22 mins                     Marisa Severin, PT, DPT Acute Rehabilitation Services Pager [phone] Office [place] 10/12/2020, 9:54 AM

## 2020-10-12 NOTE — Discharge Summary (Signed)
Patient ID: [name] MRN: [id number] DOB/AGE: 1945-12-27 75 y.o.  Admit date: 10/07/2020 Discharge date: 10/12/2020  Admission Diagnoses:  Principal Problem:   Leg length discrepancy, Right Active Problems:   History of revision of total replacement of right hip joint   Discharge Diagnoses:  Same  Past Medical History:  Diagnosis Date   Anemia    Congenital musculoskeletal deformity of spine    Depression    Headache    Hypertension    Incontinence of bowel 09/2013   Lumbago    Lumbosacral spondylosis without myelopathy    Sleep apnea    Sleep disorder    uses gabapentin at bedtime    Spinal stenosis, lumbar region, without neurogenic claudication     Surgeries: Procedure(s): RIGHT HIP REVISION OF  HIP BALL/POLY-LINER on 10/07/2020   Consultants:   Discharged Condition: Improved  Hospital Course: [name] is an 75 y.o. female who was admitted 10/07/2020 for operative treatment ofLeg length discrepancy. Patient has severe unremitting pain that affects sleep, daily activities, and work/hobbies. After pre-op clearance the patient was taken to the operating room on 10/07/2020 and underwent  Procedure(s): RIGHT HIP REVISION OF  HIP BALL/POLY-LINER.    Patient was given perioperative antibiotics:  Anti-infectives (From admission, onward)    Start     Dose/Rate Route Frequency Ordered Stop   10/07/20 1330  ceFAZolin (ANCEF) IVPB 1 g/50 mL premix        1 g 100 mL/hr over 30 Minutes Intravenous Every 6 hours 10/07/20 1244 10/07/20 2152   10/07/20 0714  ceFAZolin (ANCEF) 2-4 GM/100ML-% IVPB       Note to Pharmacy: Valda Lamb   : cabinet override      10/07/20 0714 10/07/20 1929        Patient was given sequential compression devices, early ambulation, and chemoprophylaxis to prevent DVT.  Patient benefited maximally from hospital stay and there were no complications.    Recent vital signs: Patient Vitals for the past 24 hrs:  BP Temp Temp src  Pulse Resp SpO2  10/11/20 2100 116/77 97.8 F (36.6 C) Oral 80 16 93 %  10/11/20 1302 115/73 97.8 F (36.6 C) Oral 72 17 95 %  10/11/20 0815 131/81 98.2 F (36.8 C) Oral 70 17 95 %     Recent laboratory studies:  Recent Labs    10/10/20 0352  WBC 12.0*  HGB 11.1*  HCT 33.9*  PLT 211     Discharge Medications:   Allergies as of 10/12/2020       Reactions   Lisinopril Cough        Medication List     TAKE these medications    aspirin 81 MG chewable tablet Chew 1 tablet (81 mg total) by mouth 2 (two) times daily.   Biofreeze 10 % Liqd Generic drug: Menthol (Topical Analgesic) Apply 1 application topically daily as needed (pain).   buPROPion 150 MG 24 hr tablet Commonly known as: WELLBUTRIN XL Take 450 mg by mouth daily.   docusate sodium 50 MG capsule Commonly known as: COLACE Take 50 mg by mouth daily.   gabapentin 600 MG tablet Commonly known as: NEURONTIN Take 1 tablet (600 mg total) by mouth daily. 3 tabs QHS What changed:  when to take this additional instructions   HYDROcodone-acetaminophen 5-325 MG tablet Commonly known as: NORCO/VICODIN Take 1-2 tablets by mouth every 4 (four) hours as needed for moderate pain.   losartan 50 MG tablet Commonly known as: COZAAR Take 50  mg by mouth daily.   methocarbamol 750 MG tablet Commonly known as: ROBAXIN Take 750 mg by mouth daily as needed for muscle spasms. What changed: Another medication with the same name was added. Make sure you understand how and when to take each.   methocarbamol 500 MG tablet Commonly known as: ROBAXIN Take 1 tablet (500 mg total) by mouth every 6 (six) hours as needed for muscle spasms. What changed: You were already taking a medication with the same name, and this prescription was added. Make sure you understand how and when to take each.   naproxen sodium 220 MG tablet Commonly known as: ALEVE Take 440 mg by mouth daily as needed (headache).   rosuvastatin 20 MG  tablet Commonly known as: CRESTOR Take 20 mg by mouth at bedtime.   Vitamin D (Ergocalciferol) 1.25 MG (50000 UNIT) Caps capsule Commonly known as: DRISDOL Take 50,000 Units by mouth every Thursday.               Durable Medical Equipment  (From admission, onward)           Start     Ordered   10/07/20 1245  DME 3 n 1  Once        10/07/20 1244   10/07/20 1245  DME Walker rolling  Once       Question Answer Comment  Walker: With 5 Inch Wheels   Patient needs a walker to treat with the following condition Status post revision of total hip      10/07/20 1244            Diagnostic Studies: DG Pelvis Portable  Result Date: 10/07/2020 CLINICAL DATA:  Right hip replacement EXAM: PORTABLE PELVIS 1-2 VIEWS COMPARISON:  Intraoperative imaging today FINDINGS: Changes of right hip replacement. Normal AP alignment. No hardware bony complicating feature. Remote postoperative changes in the lumbar spine. IMPRESSION: Changes of right hip replacement.  No visible complicating feature. Electronically Signed   By: Rolm Baptise M.D.   On: 10/07/2020 10:25   DG HIP OPERATIVE UNILAT WITH PELVIS RIGHT  Result Date: 10/07/2020 CLINICAL DATA:  Right hip revision EXAM: OPERATIVE RIGHT HIP (WITH PELVIS IF PERFORMED) 6 VIEWS TECHNIQUE: Fluoroscopic spot image(s) were submitted for interpretation post-operatively. COMPARISON:  05/08/2017 FINDINGS: Changes of right hip replacement/revision. Normal AP alignment. No hardware bony complicating feature. IMPRESSION: Right hip replacement/revision.  No complicating feature. Electronically Signed   By: Rolm Baptise M.D.   On: 10/07/2020 10:26    Disposition: Discharge disposition: 03-Skilled [place], [place] Follow up.   Specialty: [facility] Contact information: [place] [address] [phone]         Mcarthur Rossetti, MD Follow up in  2 week(s).   Specialty: Orthopedic Surgery Contact information: [address] [place] [address] [phone]                  Signed: Mcarthur Rossetti 10/12/2020, 7:53 AM

## 2020-10-12 NOTE — Progress Notes (Signed)
Patient's AVS d/c packet was taught at the bedside. Pt verbalized understanding of teaching and has gathered her belonging and was escorted from the unit to the d/c lounge. No IV was placed to be removed upon d/c.

## 2020-10-12 NOTE — Plan of Care (Signed)

## 2020-10-12 NOTE — TOC Progression Note (Signed)
Transition of Care [facility]) - Initial/Assessment Note    Patient Details  Name: [name] MRN: [id number] Date of Birth: Oct 07, 1945  [facility] of [facility]) CM/SW Contact:    Milinda Antis, [place] Phone Number: 10/12/2020, 11:22 AM  Clinical Narrative:                 CSW met with the patient to present bed offers.  After hearing the bed offers, the patient decided to go home with home health.  RNCM notified.  Expected Discharge Plan: [place] Barriers to Discharge: No Barriers Identified   Patient Goals and CMS Choice Patient states their goals for this hospitalization and ongoing recovery are:: To return home to husband CMS Medicare.gov Compare Post Acute Care list provided to:: Patient Choice offered to / list presented to : Patient  Expected Discharge Plan and Services Expected Discharge Plan: [place]   Discharge Planning Services: CM Consult   Living arrangements for the past 2 months: Single Family Home Expected Discharge Date: 10/12/20                         HH Arranged: PT HH Agency: [place] Date HH Agency Contacted: 10/07/20 Time HH Agency Contacted: 29 Representative spoke with at [place]: [place] Arrangements/Services Living arrangements for the past 2 months: [place] with:: Spouse Patient language and need for interpreter reviewed:: Yes Do you feel safe going back to the place where you live?: Yes      Need for Family Participation in Patient Care: Yes (Comment) Care giver support system in place?: Yes (comment) Current home services: DME (cane, BSC, RW) Criminal Activity/Legal Involvement Pertinent to Current Situation/Hospitalization: No - Comment as needed  Activities of Daily Living Home Assistive Devices/Equipment: Eyeglasses, Cane (specify quad or straight) ADL Screening (condition at time of admission) Patient's cognitive ability adequate to safely  complete daily activities?: Yes Is the patient deaf or have difficulty hearing?: No Does the patient have difficulty seeing, even when wearing glasses/contacts?: No Does the patient have difficulty concentrating, remembering, or making decisions?: No Patient able to express need for assistance with ADLs?: Yes Does the patient have difficulty dressing or bathing?: No Independently performs ADLs?: Yes (appropriate for developmental age) Does the patient have difficulty walking or climbing stairs?: Yes Weakness of Legs: Right Weakness of Arms/Hands: None  Permission Sought/Granted   Permission granted to share information with : Yes, Verbal Permission Granted  Share Information with NAME: Kytzia Gienger (Spouse) [phone]  Permission granted to share info w AGENCY: SNF        Emotional Assessment Appearance:: Appears stated age Attitude/Demeanor/Rapport: Engaged Affect (typically observed): Pleasant Orientation: : Oriented to Situation, Oriented to  Time, Oriented to Place, Oriented to Self Alcohol / Substance Use: Not Applicable Psych Involvement: No (comment)  Admission diagnosis:  History of revision of total replacement of right hip joint [Z96.641] Patient Active Problem List   Diagnosis Date Noted   Leg length discrepancy, Right 10/07/2020   History of revision of total replacement of right hip joint 10/07/2020   Multinodular goiter 05/18/2020   Hip contracture, unspecified laterality 07/02/2018   Overweight (BMI 25.0-29.9) 05/09/2017   S/P right THA, AA 05/08/2017   Chronic bilateral low back pain without sciatica 04/05/2015   Disorder of sacroiliac joint 12/22/2014   Chronic low back pain 12/22/2014   Acquired scoliosis 11/27/2011   Lumbosacral spondylosis without myelopathy  05/22/2011   PCP:  Maurice Small, MD Pharmacy:   [id number] [place], [address] [address]Phone: [phone]:  [phone]    Social Determinants of Health (SDOH) Interventions    Readmission Risk Interventions No flowsheet data found.

## 2020-10-12 NOTE — TOC Transition Note (Addendum)
Transition of Care [facility]) - CM/SW Discharge Note   Patient Details  Name: [name] MRN: [id number] Date of Birth: 06/24/1945  Transition of Care [facility]) CM/SW Contact:  Sharin Mons, RN Phone Number: 10/12/2020, 12:17 PM   Clinical Narrative:    Patient will DC to: home Anticipated DC date: 10/12/2020 Family notified: yes, husband Transport by: car 9/29        -  R HIP REVISION OF  HIP   Per MD patient ready for DC today. Pt declined  SNF placement. RN, patient, and patient's husband, and [place] ( home health services prearranged) notified of DC. No DME needs noted . Pt already has @ home R.W and 3in1/BSC. Pt without Rx med concerns.  Husband to provide transportation to home.  Post hospital f/uu noted on AVS.  RNCM will sign off for now as intervention is no longer needed. Please consult [place] again if new needs arise.    Final next level of care: Home w Home Health Services Barriers to Discharge: No Barriers Identified   Patient Goals and CMS Choice Patient states their goals for this hospitalization and ongoing recovery are:: To return home to husband CMS Medicare.gov Compare Post Acute Care list provided to:: Patient Choice offered to / list presented to : Patient  Discharge Placement                       Discharge Plan and Services   Discharge Planning Services: CM Consult                      HH Arranged: PT [facility] Agency: [place] Date [place]: 10/12/20 Time [place]: 1209 Representative spoke with at [place]: [place] ([place]) Interventions     Readmission Risk Interventions No flowsheet data found.

## 2020-10-12 NOTE — Progress Notes (Signed)
Patient ID: [name], female   DOB: 11-Apr-1945, 75 y.o.   MRN: [id number] At this point, the patient feels that she would be better served by being just discharged to home today as opposed to short-term skilled nursing.  We will discharge her to home.  All questions and concerns were answered and addressed.

## 2020-10-12 NOTE — Progress Notes (Signed)
Patient ID: [name], female   DOB: December 22, 1945, 75 y.o.   MRN: [id number] The patient feels better overall.  She is still concerned about her mobility and being at home with her elderly husband.  Her biggest issue is getting in and out of bed she states.  Her right hip is stable.  This will be 1 week since she has been in the hospital.  The transitional care team is working on short-term skilled nursing placement.  She can be discharged from medical and orthopedic standpoint.

## 2020-10-12 NOTE — Plan of Care (Signed)
  Problem: Education: Goal: Knowledge of General Education information will improve Description: Including pain rating scale, medication(s)/side effects and non-pharmacologic comfort measures 10/12/2020 1534 by Damaris Schooner, RN Outcome: Adequate for Discharge 10/12/2020 1227 by Damaris Schooner, RN Outcome: Progressing   Problem: Health Behavior/Discharge Planning: Goal: Ability to manage health-related needs will improve 10/12/2020 1534 by Damaris Schooner, RN Outcome: Adequate for Discharge 10/12/2020 1227 by Damaris Schooner, RN Outcome: Progressing   Problem: Clinical Measurements: Goal: Ability to maintain clinical measurements within normal limits will improve 10/12/2020 1534 by Damaris Schooner, RN Outcome: Adequate for Discharge 10/12/2020 1227 by Damaris Schooner, RN Outcome: Progressing Goal: Will remain free from infection 10/12/2020 1534 by Damaris Schooner, RN Outcome: Adequate for Discharge 10/12/2020 1227 by Damaris Schooner, RN Outcome: Progressing Goal: Diagnostic test results will improve 10/12/2020 1534 by Damaris Schooner, RN Outcome: Adequate for Discharge 10/12/2020 1227 by Damaris Schooner, RN Outcome: Progressing Goal: Respiratory complications will improve 10/12/2020 1534 by Damaris Schooner, RN Outcome: Adequate for Discharge 10/12/2020 1227 by Damaris Schooner, RN Outcome: Progressing Goal: Cardiovascular complication will be avoided 10/12/2020 1534 by Damaris Schooner, RN Outcome: Adequate for Discharge 10/12/2020 1227 by Damaris Schooner, RN Outcome: Progressing   Problem: Activity: Goal: Risk for activity intolerance will decrease 10/12/2020 1534 by Damaris Schooner, RN Outcome: Adequate for Discharge 10/12/2020 1227 by Damaris Schooner, RN Outcome: Progressing   Problem: Nutrition: Goal: Adequate nutrition will be maintained 10/12/2020 1534 by Damaris Schooner, RN Outcome: Adequate for Discharge 10/12/2020 1227 by Damaris Schooner, RN Outcome: Progressing    Problem: Coping: Goal: Level of anxiety will decrease 10/12/2020 1534 by Damaris Schooner, RN Outcome: Adequate for Discharge 10/12/2020 1227 by Damaris Schooner, RN Outcome: Progressing   Problem: Elimination: Goal: Will not experience complications related to bowel motility 10/12/2020 1534 by Damaris Schooner, RN Outcome: Adequate for Discharge 10/12/2020 1227 by Damaris Schooner, RN Outcome: Progressing Goal: Will not experience complications related to urinary retention 10/12/2020 1534 by Damaris Schooner, RN Outcome: Adequate for Discharge 10/12/2020 1227 by Damaris Schooner, RN Outcome: Progressing   Problem: Pain Managment: Goal: General experience of comfort will improve 10/12/2020 1534 by Damaris Schooner, RN Outcome: Adequate for Discharge 10/12/2020 1227 by Damaris Schooner, RN Outcome: Progressing   Problem: Safety: Goal: Ability to remain free from injury will improve 10/12/2020 1534 by Damaris Schooner, RN Outcome: Adequate for Discharge 10/12/2020 1227 by Damaris Schooner, RN Outcome: Progressing   Problem: Skin Integrity: Goal: Risk for impaired skin integrity will decrease 10/12/2020 1534 by Damaris Schooner, RN Outcome: Adequate for Discharge 10/12/2020 1227 by Damaris Schooner, RN Outcome: Progressing

## 2020-10-13 ENCOUNTER — Telehealth: Payer: Self-pay | Admitting: Orthopaedic Surgery

## 2020-10-13 NOTE — Telephone Encounter (Signed)
Pt stated her incision has bleeding. Please call pt at [phone].

## 2020-10-14 NOTE — Telephone Encounter (Signed)
She is one week out. Should we bring her in to be seen today?

## 2020-10-14 NOTE — Telephone Encounter (Signed)
I called and talked to the pt and she denies and fever and chills. No drainage other than blood. She stated she doesn't have another bandage to change it. I will place one up front for her for if she needs it prior to appt. She is scheduled for  two week f/u on 10/13

## 2020-10-15 ENCOUNTER — Encounter (HOSPITAL_COMMUNITY): Payer: Self-pay | Admitting: Orthopaedic Surgery

## 2020-10-15 DIAGNOSIS — T84090D Other mechanical complication of internal right hip prosthesis, subsequent encounter: Secondary | ICD-10-CM | POA: Diagnosis not present

## 2020-10-15 DIAGNOSIS — M47817 Spondylosis without myelopathy or radiculopathy, lumbosacral region: Secondary | ICD-10-CM | POA: Diagnosis not present

## 2020-10-15 DIAGNOSIS — G4730 Sleep apnea, unspecified: Secondary | ICD-10-CM | POA: Diagnosis not present

## 2020-10-15 DIAGNOSIS — M419 Scoliosis, unspecified: Secondary | ICD-10-CM | POA: Diagnosis not present

## 2020-10-15 DIAGNOSIS — D649 Anemia, unspecified: Secondary | ICD-10-CM | POA: Diagnosis not present

## 2020-10-15 DIAGNOSIS — Z9181 History of falling: Secondary | ICD-10-CM | POA: Diagnosis not present

## 2020-10-15 DIAGNOSIS — M48061 Spinal stenosis, lumbar region without neurogenic claudication: Secondary | ICD-10-CM | POA: Diagnosis not present

## 2020-10-15 DIAGNOSIS — F32A Depression, unspecified: Secondary | ICD-10-CM | POA: Diagnosis not present

## 2020-10-15 DIAGNOSIS — Z7982 Long term (current) use of aspirin: Secondary | ICD-10-CM | POA: Diagnosis not present

## 2020-10-15 DIAGNOSIS — I10 Essential (primary) hypertension: Secondary | ICD-10-CM | POA: Diagnosis not present

## 2020-10-18 ENCOUNTER — Telehealth: Payer: Self-pay | Admitting: Orthopaedic Surgery

## 2020-10-18 ENCOUNTER — Telehealth: Payer: Self-pay

## 2020-10-18 ENCOUNTER — Ambulatory Visit: Payer: Medicare Other | Admitting: Cardiovascular Disease

## 2020-10-18 NOTE — Telephone Encounter (Signed)
I called and talked to the pt. No fever or chills. Per pt no signs of infection. Drainage is starting to decrease. I advised pt this is normal and we will look at incision @ appt on Thursday. She stated understanding

## 2020-10-18 NOTE — Telephone Encounter (Signed)
Patient called and states that she had recent Right THA preformed on 10/07/2020 with Dr. Ninfa Linden. She states that this is the second time that she has had to change her dressing and wanted to know if that was normal. She does mention that she has upcoming appointment on 10/21/2020 at 1:15PM

## 2020-10-19 ENCOUNTER — Other Ambulatory Visit: Payer: Self-pay | Admitting: Orthopaedic Surgery

## 2020-10-19 DIAGNOSIS — F32A Depression, unspecified: Secondary | ICD-10-CM | POA: Diagnosis not present

## 2020-10-19 DIAGNOSIS — M48061 Spinal stenosis, lumbar region without neurogenic claudication: Secondary | ICD-10-CM | POA: Diagnosis not present

## 2020-10-19 DIAGNOSIS — G4730 Sleep apnea, unspecified: Secondary | ICD-10-CM | POA: Diagnosis not present

## 2020-10-19 DIAGNOSIS — M47817 Spondylosis without myelopathy or radiculopathy, lumbosacral region: Secondary | ICD-10-CM | POA: Diagnosis not present

## 2020-10-19 DIAGNOSIS — I10 Essential (primary) hypertension: Secondary | ICD-10-CM | POA: Diagnosis not present

## 2020-10-19 DIAGNOSIS — T84090D Other mechanical complication of internal right hip prosthesis, subsequent encounter: Secondary | ICD-10-CM | POA: Diagnosis not present

## 2020-10-21 ENCOUNTER — Encounter: Payer: Self-pay | Admitting: Orthopaedic Surgery

## 2020-10-21 ENCOUNTER — Ambulatory Visit (INDEPENDENT_AMBULATORY_CARE_PROVIDER_SITE_OTHER): Payer: Medicare Other | Admitting: Orthopaedic Surgery

## 2020-10-21 DIAGNOSIS — M48061 Spinal stenosis, lumbar region without neurogenic claudication: Secondary | ICD-10-CM | POA: Diagnosis not present

## 2020-10-21 DIAGNOSIS — T84090D Other mechanical complication of internal right hip prosthesis, subsequent encounter: Secondary | ICD-10-CM | POA: Diagnosis not present

## 2020-10-21 DIAGNOSIS — F32A Depression, unspecified: Secondary | ICD-10-CM | POA: Diagnosis not present

## 2020-10-21 DIAGNOSIS — M47817 Spondylosis without myelopathy or radiculopathy, lumbosacral region: Secondary | ICD-10-CM | POA: Diagnosis not present

## 2020-10-21 DIAGNOSIS — G4730 Sleep apnea, unspecified: Secondary | ICD-10-CM | POA: Diagnosis not present

## 2020-10-21 DIAGNOSIS — I10 Essential (primary) hypertension: Secondary | ICD-10-CM | POA: Diagnosis not present

## 2020-10-21 DIAGNOSIS — Z96641 Presence of right artificial hip joint: Secondary | ICD-10-CM

## 2020-10-21 MED ORDER — IBUPROFEN 800 MG PO TABS: 800.0000 mg | ORAL_TABLET | Freq: Three times a day (TID) | ORAL | 1 refills | Status: DC | PRN

## 2020-10-21 NOTE — Progress Notes (Signed)
The patient is 2 weeks tomorrow status post revision arthroplasty of the left hip that was significantly shorter than the right side.  Her original hip replacement was done about 3 years ago by one of my colleagues in town.  Her leg lengths were off enough that she had a shoe lift for over an inch.  We were able to get her back out to leg length with going up to hip ball sizes.  She is reporting pain but overall is doing well.  Laying in a supine position her leg lengths are equal.  The staples were removed and Steri-Strips applied to her incision.  At this point she is minimizing her narcotics but still needs some on occasion and I think is reasonable.  I did send in some 800 mg ibuprofen her to take as well.  She can stop her aspirin.  Her calf is soft and her foot is not swollen.  I would like to see her back in 4 weeks to see how she is doing overall but no x-rays are needed.  If there is issues before then she knows to let us know.

## 2020-10-22 ENCOUNTER — Telehealth: Payer: Self-pay | Admitting: Orthopaedic Surgery

## 2020-10-22 NOTE — Telephone Encounter (Signed)
Pt called requesting a call back. Pt states she has staples removed yesterday and incision has developed a rash and has itching. Please call pt at [phone].

## 2020-10-22 NOTE — Telephone Encounter (Signed)
Patient aware of the below message  

## 2020-10-23 DIAGNOSIS — M48061 Spinal stenosis, lumbar region without neurogenic claudication: Secondary | ICD-10-CM | POA: Diagnosis not present

## 2020-10-23 DIAGNOSIS — T84090D Other mechanical complication of internal right hip prosthesis, subsequent encounter: Secondary | ICD-10-CM | POA: Diagnosis not present

## 2020-10-23 DIAGNOSIS — F32A Depression, unspecified: Secondary | ICD-10-CM | POA: Diagnosis not present

## 2020-10-23 DIAGNOSIS — I10 Essential (primary) hypertension: Secondary | ICD-10-CM | POA: Diagnosis not present

## 2020-10-23 DIAGNOSIS — G4730 Sleep apnea, unspecified: Secondary | ICD-10-CM | POA: Diagnosis not present

## 2020-10-23 DIAGNOSIS — M47817 Spondylosis without myelopathy or radiculopathy, lumbosacral region: Secondary | ICD-10-CM | POA: Diagnosis not present

## 2020-10-25 DIAGNOSIS — T84090D Other mechanical complication of internal right hip prosthesis, subsequent encounter: Secondary | ICD-10-CM | POA: Diagnosis not present

## 2020-10-25 DIAGNOSIS — M48061 Spinal stenosis, lumbar region without neurogenic claudication: Secondary | ICD-10-CM | POA: Diagnosis not present

## 2020-10-25 DIAGNOSIS — F331 Major depressive disorder, recurrent, moderate: Secondary | ICD-10-CM | POA: Diagnosis not present

## 2020-10-25 DIAGNOSIS — F32A Depression, unspecified: Secondary | ICD-10-CM | POA: Diagnosis not present

## 2020-10-25 DIAGNOSIS — I10 Essential (primary) hypertension: Secondary | ICD-10-CM | POA: Diagnosis not present

## 2020-10-25 DIAGNOSIS — G4730 Sleep apnea, unspecified: Secondary | ICD-10-CM | POA: Diagnosis not present

## 2020-10-25 DIAGNOSIS — F419 Anxiety disorder, unspecified: Secondary | ICD-10-CM | POA: Diagnosis not present

## 2020-10-25 DIAGNOSIS — M47817 Spondylosis without myelopathy or radiculopathy, lumbosacral region: Secondary | ICD-10-CM | POA: Diagnosis not present

## 2020-10-27 ENCOUNTER — Encounter: Payer: Medicare Other | Admitting: Orthopaedic Surgery

## 2020-10-27 DIAGNOSIS — F32A Depression, unspecified: Secondary | ICD-10-CM | POA: Diagnosis not present

## 2020-10-27 DIAGNOSIS — I10 Essential (primary) hypertension: Secondary | ICD-10-CM | POA: Diagnosis not present

## 2020-10-27 DIAGNOSIS — G4730 Sleep apnea, unspecified: Secondary | ICD-10-CM | POA: Diagnosis not present

## 2020-10-27 DIAGNOSIS — M48061 Spinal stenosis, lumbar region without neurogenic claudication: Secondary | ICD-10-CM | POA: Diagnosis not present

## 2020-10-27 DIAGNOSIS — M47817 Spondylosis without myelopathy or radiculopathy, lumbosacral region: Secondary | ICD-10-CM | POA: Diagnosis not present

## 2020-10-27 DIAGNOSIS — T84090D Other mechanical complication of internal right hip prosthesis, subsequent encounter: Secondary | ICD-10-CM | POA: Diagnosis not present

## 2020-10-28 ENCOUNTER — Other Ambulatory Visit: Payer: Self-pay

## 2020-10-28 ENCOUNTER — Ambulatory Visit
Admission: RE | Admit: 2020-10-28 | Discharge: 2020-10-28 | Disposition: A | Discharge status: Home / Self Care | Payer: Medicare Other | Source: Ambulatory Visit | Attending: Family Medicine | Admitting: Family Medicine

## 2020-10-28 DIAGNOSIS — Z1231 Encounter for screening mammogram for malignant neoplasm of breast: Secondary | ICD-10-CM

## 2020-10-29 ENCOUNTER — Other Ambulatory Visit: Payer: Self-pay | Admitting: Orthopaedic Surgery

## 2020-10-29 DIAGNOSIS — T84090D Other mechanical complication of internal right hip prosthesis, subsequent encounter: Secondary | ICD-10-CM | POA: Diagnosis not present

## 2020-10-29 DIAGNOSIS — M48061 Spinal stenosis, lumbar region without neurogenic claudication: Secondary | ICD-10-CM | POA: Diagnosis not present

## 2020-10-29 DIAGNOSIS — G4730 Sleep apnea, unspecified: Secondary | ICD-10-CM | POA: Diagnosis not present

## 2020-10-29 DIAGNOSIS — M47817 Spondylosis without myelopathy or radiculopathy, lumbosacral region: Secondary | ICD-10-CM | POA: Diagnosis not present

## 2020-10-29 DIAGNOSIS — I10 Essential (primary) hypertension: Secondary | ICD-10-CM | POA: Diagnosis not present

## 2020-10-29 DIAGNOSIS — F32A Depression, unspecified: Secondary | ICD-10-CM | POA: Diagnosis not present

## 2020-11-01 ENCOUNTER — Other Ambulatory Visit: Payer: Self-pay

## 2020-11-01 ENCOUNTER — Telehealth: Payer: Self-pay | Admitting: Orthopaedic Surgery

## 2020-11-01 DIAGNOSIS — Z96641 Presence of right artificial hip joint: Secondary | ICD-10-CM

## 2020-11-01 DIAGNOSIS — N63 Unspecified lump in unspecified breast: Secondary | ICD-10-CM

## 2020-11-01 NOTE — Telephone Encounter (Signed)
Referral sent 

## 2020-11-01 NOTE — Telephone Encounter (Signed)
Patient called. She would like a referral sent to [facility] for outpatient PT. Her call back number is [phone]

## 2020-11-02 DIAGNOSIS — H35033 Hypertensive retinopathy, bilateral: Secondary | ICD-10-CM | POA: Diagnosis not present

## 2020-11-02 DIAGNOSIS — H2513 Age-related nuclear cataract, bilateral: Secondary | ICD-10-CM | POA: Diagnosis not present

## 2020-11-05 DIAGNOSIS — H52203 Unspecified astigmatism, bilateral: Secondary | ICD-10-CM | POA: Diagnosis not present

## 2020-11-05 DIAGNOSIS — H2513 Age-related nuclear cataract, bilateral: Secondary | ICD-10-CM | POA: Diagnosis not present

## 2020-11-08 DIAGNOSIS — R293 Abnormal posture: Secondary | ICD-10-CM | POA: Diagnosis not present

## 2020-11-08 DIAGNOSIS — Z96641 Presence of right artificial hip joint: Secondary | ICD-10-CM | POA: Diagnosis not present

## 2020-11-08 DIAGNOSIS — R531 Weakness: Secondary | ICD-10-CM | POA: Diagnosis not present

## 2020-11-08 DIAGNOSIS — M25651 Stiffness of right hip, not elsewhere classified: Secondary | ICD-10-CM | POA: Diagnosis not present

## 2020-11-08 DIAGNOSIS — Z4789 Encounter for other orthopedic aftercare: Secondary | ICD-10-CM | POA: Diagnosis not present

## 2020-11-08 DIAGNOSIS — R262 Difficulty in walking, not elsewhere classified: Secondary | ICD-10-CM | POA: Diagnosis not present

## 2020-11-08 DIAGNOSIS — M25652 Stiffness of left hip, not elsewhere classified: Secondary | ICD-10-CM | POA: Diagnosis not present

## 2020-11-09 ENCOUNTER — Other Ambulatory Visit: Payer: Self-pay | Admitting: Family Medicine

## 2020-11-09 DIAGNOSIS — N63 Unspecified lump in unspecified breast: Secondary | ICD-10-CM

## 2020-11-11 DIAGNOSIS — R262 Difficulty in walking, not elsewhere classified: Secondary | ICD-10-CM | POA: Diagnosis not present

## 2020-11-11 DIAGNOSIS — R293 Abnormal posture: Secondary | ICD-10-CM | POA: Diagnosis not present

## 2020-11-11 DIAGNOSIS — Z4789 Encounter for other orthopedic aftercare: Secondary | ICD-10-CM | POA: Diagnosis not present

## 2020-11-11 DIAGNOSIS — M25652 Stiffness of left hip, not elsewhere classified: Secondary | ICD-10-CM | POA: Diagnosis not present

## 2020-11-11 DIAGNOSIS — M25651 Stiffness of right hip, not elsewhere classified: Secondary | ICD-10-CM | POA: Diagnosis not present

## 2020-11-11 DIAGNOSIS — R531 Weakness: Secondary | ICD-10-CM | POA: Diagnosis not present

## 2020-11-11 DIAGNOSIS — Z96641 Presence of right artificial hip joint: Secondary | ICD-10-CM | POA: Diagnosis not present

## 2020-11-15 ENCOUNTER — Other Ambulatory Visit: Payer: Self-pay | Admitting: Family Medicine

## 2020-11-15 ENCOUNTER — Other Ambulatory Visit: Payer: Self-pay

## 2020-11-15 ENCOUNTER — Ambulatory Visit
Admission: RE | Admit: 2020-11-15 | Discharge: 2020-11-15 | Disposition: A | Discharge status: Home / Self Care | Payer: Medicare Other | Source: Ambulatory Visit | Attending: Family Medicine | Admitting: Family Medicine

## 2020-11-15 ENCOUNTER — Ambulatory Visit: Payer: Medicare Other

## 2020-11-15 DIAGNOSIS — Z1231 Encounter for screening mammogram for malignant neoplasm of breast: Secondary | ICD-10-CM | POA: Diagnosis not present

## 2020-11-15 DIAGNOSIS — N63 Unspecified lump in unspecified breast: Secondary | ICD-10-CM

## 2020-11-16 DIAGNOSIS — R262 Difficulty in walking, not elsewhere classified: Secondary | ICD-10-CM | POA: Diagnosis not present

## 2020-11-16 DIAGNOSIS — Z4789 Encounter for other orthopedic aftercare: Secondary | ICD-10-CM | POA: Diagnosis not present

## 2020-11-16 DIAGNOSIS — M25652 Stiffness of left hip, not elsewhere classified: Secondary | ICD-10-CM | POA: Diagnosis not present

## 2020-11-16 DIAGNOSIS — R531 Weakness: Secondary | ICD-10-CM | POA: Diagnosis not present

## 2020-11-16 DIAGNOSIS — R293 Abnormal posture: Secondary | ICD-10-CM | POA: Diagnosis not present

## 2020-11-16 DIAGNOSIS — M25651 Stiffness of right hip, not elsewhere classified: Secondary | ICD-10-CM | POA: Diagnosis not present

## 2020-11-16 DIAGNOSIS — Z96641 Presence of right artificial hip joint: Secondary | ICD-10-CM | POA: Diagnosis not present

## 2020-11-18 DIAGNOSIS — F419 Anxiety disorder, unspecified: Secondary | ICD-10-CM | POA: Diagnosis not present

## 2020-11-18 DIAGNOSIS — F331 Major depressive disorder, recurrent, moderate: Secondary | ICD-10-CM | POA: Diagnosis not present

## 2020-11-19 DIAGNOSIS — R531 Weakness: Secondary | ICD-10-CM | POA: Diagnosis not present

## 2020-11-19 DIAGNOSIS — M25651 Stiffness of right hip, not elsewhere classified: Secondary | ICD-10-CM | POA: Diagnosis not present

## 2020-11-19 DIAGNOSIS — R293 Abnormal posture: Secondary | ICD-10-CM | POA: Diagnosis not present

## 2020-11-19 DIAGNOSIS — R262 Difficulty in walking, not elsewhere classified: Secondary | ICD-10-CM | POA: Diagnosis not present

## 2020-11-19 DIAGNOSIS — Z4789 Encounter for other orthopedic aftercare: Secondary | ICD-10-CM | POA: Diagnosis not present

## 2020-11-19 DIAGNOSIS — Z96641 Presence of right artificial hip joint: Secondary | ICD-10-CM | POA: Diagnosis not present

## 2020-11-19 DIAGNOSIS — M25652 Stiffness of left hip, not elsewhere classified: Secondary | ICD-10-CM | POA: Diagnosis not present

## 2020-11-23 DIAGNOSIS — R293 Abnormal posture: Secondary | ICD-10-CM | POA: Diagnosis not present

## 2020-11-23 DIAGNOSIS — M25651 Stiffness of right hip, not elsewhere classified: Secondary | ICD-10-CM | POA: Diagnosis not present

## 2020-11-23 DIAGNOSIS — R531 Weakness: Secondary | ICD-10-CM | POA: Diagnosis not present

## 2020-11-23 DIAGNOSIS — R262 Difficulty in walking, not elsewhere classified: Secondary | ICD-10-CM | POA: Diagnosis not present

## 2020-11-23 DIAGNOSIS — M25652 Stiffness of left hip, not elsewhere classified: Secondary | ICD-10-CM | POA: Diagnosis not present

## 2020-11-23 DIAGNOSIS — Z96641 Presence of right artificial hip joint: Secondary | ICD-10-CM | POA: Diagnosis not present

## 2020-11-23 DIAGNOSIS — Z4789 Encounter for other orthopedic aftercare: Secondary | ICD-10-CM | POA: Diagnosis not present

## 2020-11-24 ENCOUNTER — Other Ambulatory Visit: Payer: Self-pay

## 2020-11-24 ENCOUNTER — Encounter: Payer: Self-pay | Admitting: Orthopaedic Surgery

## 2020-11-24 ENCOUNTER — Ambulatory Visit (INDEPENDENT_AMBULATORY_CARE_PROVIDER_SITE_OTHER): Payer: Medicare Other | Admitting: Orthopaedic Surgery

## 2020-11-24 DIAGNOSIS — Z96641 Presence of right artificial hip joint: Secondary | ICD-10-CM

## 2020-11-24 DIAGNOSIS — M2170 Unequal limb length (acquired), unspecified site: Secondary | ICD-10-CM

## 2020-11-24 NOTE — Progress Notes (Signed)
The patient is a 75 year old female who is being seen today in follow-up at 6-week status post a head ball and liner exchange.  We upsized her hip ball to length sizes.  She has had a significant leg length discrepancy following the previous right total hip arthroplasty done by one of our colleagues in town.  She has scoliosis and this certainly affected her posture and gait.  She feels like her leg lengths are on and she has no pain and is doing well with range of motion and strength.  She does walk with a cane but this is secondary to chronic back issues.  She is in outpatient physical therapy at [facility]  Her right operative hip moves smoothly and fluidly.  The incision is healed nicely.  Her leg lengths are equal.  From my standpoint I do not need to see her back for 6 months unless she is having issues.  We will have a standing low AP pelvis at that visit in 6 months.  All questions and concerns were answered and addressed.

## 2020-11-26 DIAGNOSIS — Z4789 Encounter for other orthopedic aftercare: Secondary | ICD-10-CM | POA: Diagnosis not present

## 2020-11-26 DIAGNOSIS — R262 Difficulty in walking, not elsewhere classified: Secondary | ICD-10-CM | POA: Diagnosis not present

## 2020-11-26 DIAGNOSIS — R531 Weakness: Secondary | ICD-10-CM | POA: Diagnosis not present

## 2020-11-26 DIAGNOSIS — M25651 Stiffness of right hip, not elsewhere classified: Secondary | ICD-10-CM | POA: Diagnosis not present

## 2020-11-26 DIAGNOSIS — R293 Abnormal posture: Secondary | ICD-10-CM | POA: Diagnosis not present

## 2020-11-26 DIAGNOSIS — M25652 Stiffness of left hip, not elsewhere classified: Secondary | ICD-10-CM | POA: Diagnosis not present

## 2020-11-26 DIAGNOSIS — Z96641 Presence of right artificial hip joint: Secondary | ICD-10-CM | POA: Diagnosis not present

## 2020-11-26 DIAGNOSIS — F331 Major depressive disorder, recurrent, moderate: Secondary | ICD-10-CM | POA: Diagnosis not present

## 2020-11-29 DIAGNOSIS — R262 Difficulty in walking, not elsewhere classified: Secondary | ICD-10-CM | POA: Diagnosis not present

## 2020-11-29 DIAGNOSIS — M25652 Stiffness of left hip, not elsewhere classified: Secondary | ICD-10-CM | POA: Diagnosis not present

## 2020-11-29 DIAGNOSIS — R531 Weakness: Secondary | ICD-10-CM | POA: Diagnosis not present

## 2020-11-29 DIAGNOSIS — R293 Abnormal posture: Secondary | ICD-10-CM | POA: Diagnosis not present

## 2020-11-29 DIAGNOSIS — Z4789 Encounter for other orthopedic aftercare: Secondary | ICD-10-CM | POA: Diagnosis not present

## 2020-11-29 DIAGNOSIS — Z96641 Presence of right artificial hip joint: Secondary | ICD-10-CM | POA: Diagnosis not present

## 2020-11-29 DIAGNOSIS — M25651 Stiffness of right hip, not elsewhere classified: Secondary | ICD-10-CM | POA: Diagnosis not present

## 2020-12-01 DIAGNOSIS — R262 Difficulty in walking, not elsewhere classified: Secondary | ICD-10-CM | POA: Diagnosis not present

## 2020-12-01 DIAGNOSIS — R293 Abnormal posture: Secondary | ICD-10-CM | POA: Diagnosis not present

## 2020-12-01 DIAGNOSIS — M25651 Stiffness of right hip, not elsewhere classified: Secondary | ICD-10-CM | POA: Diagnosis not present

## 2020-12-01 DIAGNOSIS — R531 Weakness: Secondary | ICD-10-CM | POA: Diagnosis not present

## 2020-12-01 DIAGNOSIS — Z96641 Presence of right artificial hip joint: Secondary | ICD-10-CM | POA: Diagnosis not present

## 2020-12-01 DIAGNOSIS — Z4789 Encounter for other orthopedic aftercare: Secondary | ICD-10-CM | POA: Diagnosis not present

## 2020-12-01 DIAGNOSIS — M25652 Stiffness of left hip, not elsewhere classified: Secondary | ICD-10-CM | POA: Diagnosis not present

## 2020-12-07 DIAGNOSIS — Z96641 Presence of right artificial hip joint: Secondary | ICD-10-CM | POA: Diagnosis not present

## 2020-12-07 DIAGNOSIS — R262 Difficulty in walking, not elsewhere classified: Secondary | ICD-10-CM | POA: Diagnosis not present

## 2020-12-07 DIAGNOSIS — M25651 Stiffness of right hip, not elsewhere classified: Secondary | ICD-10-CM | POA: Diagnosis not present

## 2020-12-07 DIAGNOSIS — Z4789 Encounter for other orthopedic aftercare: Secondary | ICD-10-CM | POA: Diagnosis not present

## 2020-12-07 DIAGNOSIS — R293 Abnormal posture: Secondary | ICD-10-CM | POA: Diagnosis not present

## 2020-12-07 DIAGNOSIS — M25652 Stiffness of left hip, not elsewhere classified: Secondary | ICD-10-CM | POA: Diagnosis not present

## 2020-12-07 DIAGNOSIS — R531 Weakness: Secondary | ICD-10-CM | POA: Diagnosis not present

## 2020-12-10 DIAGNOSIS — R531 Weakness: Secondary | ICD-10-CM | POA: Diagnosis not present

## 2020-12-10 DIAGNOSIS — R293 Abnormal posture: Secondary | ICD-10-CM | POA: Diagnosis not present

## 2020-12-10 DIAGNOSIS — M25652 Stiffness of left hip, not elsewhere classified: Secondary | ICD-10-CM | POA: Diagnosis not present

## 2020-12-10 DIAGNOSIS — Z96641 Presence of right artificial hip joint: Secondary | ICD-10-CM | POA: Diagnosis not present

## 2020-12-10 DIAGNOSIS — Z4789 Encounter for other orthopedic aftercare: Secondary | ICD-10-CM | POA: Diagnosis not present

## 2020-12-10 DIAGNOSIS — R262 Difficulty in walking, not elsewhere classified: Secondary | ICD-10-CM | POA: Diagnosis not present

## 2020-12-10 DIAGNOSIS — M25651 Stiffness of right hip, not elsewhere classified: Secondary | ICD-10-CM | POA: Diagnosis not present

## 2020-12-14 DIAGNOSIS — Z4789 Encounter for other orthopedic aftercare: Secondary | ICD-10-CM | POA: Diagnosis not present

## 2020-12-14 DIAGNOSIS — R293 Abnormal posture: Secondary | ICD-10-CM | POA: Diagnosis not present

## 2020-12-14 DIAGNOSIS — Z96641 Presence of right artificial hip joint: Secondary | ICD-10-CM | POA: Diagnosis not present

## 2020-12-14 DIAGNOSIS — R262 Difficulty in walking, not elsewhere classified: Secondary | ICD-10-CM | POA: Diagnosis not present

## 2020-12-14 DIAGNOSIS — M25651 Stiffness of right hip, not elsewhere classified: Secondary | ICD-10-CM | POA: Diagnosis not present

## 2020-12-14 DIAGNOSIS — M25652 Stiffness of left hip, not elsewhere classified: Secondary | ICD-10-CM | POA: Diagnosis not present

## 2020-12-14 DIAGNOSIS — R531 Weakness: Secondary | ICD-10-CM | POA: Diagnosis not present

## 2020-12-20 DIAGNOSIS — F331 Major depressive disorder, recurrent, moderate: Secondary | ICD-10-CM | POA: Diagnosis not present

## 2020-12-20 DIAGNOSIS — F419 Anxiety disorder, unspecified: Secondary | ICD-10-CM | POA: Diagnosis not present

## 2020-12-30 DIAGNOSIS — R262 Difficulty in walking, not elsewhere classified: Secondary | ICD-10-CM | POA: Diagnosis not present

## 2020-12-30 DIAGNOSIS — Z96641 Presence of right artificial hip joint: Secondary | ICD-10-CM | POA: Diagnosis not present

## 2020-12-30 DIAGNOSIS — M25651 Stiffness of right hip, not elsewhere classified: Secondary | ICD-10-CM | POA: Diagnosis not present

## 2020-12-30 DIAGNOSIS — M25652 Stiffness of left hip, not elsewhere classified: Secondary | ICD-10-CM | POA: Diagnosis not present

## 2020-12-30 DIAGNOSIS — R293 Abnormal posture: Secondary | ICD-10-CM | POA: Diagnosis not present

## 2020-12-30 DIAGNOSIS — Z4789 Encounter for other orthopedic aftercare: Secondary | ICD-10-CM | POA: Diagnosis not present

## 2020-12-30 DIAGNOSIS — R531 Weakness: Secondary | ICD-10-CM | POA: Diagnosis not present

## 2021-01-07 DIAGNOSIS — R262 Difficulty in walking, not elsewhere classified: Secondary | ICD-10-CM | POA: Diagnosis not present

## 2021-01-07 DIAGNOSIS — Z4789 Encounter for other orthopedic aftercare: Secondary | ICD-10-CM | POA: Diagnosis not present

## 2021-01-07 DIAGNOSIS — R293 Abnormal posture: Secondary | ICD-10-CM | POA: Diagnosis not present

## 2021-01-07 DIAGNOSIS — Z96641 Presence of right artificial hip joint: Secondary | ICD-10-CM | POA: Diagnosis not present

## 2021-01-07 DIAGNOSIS — R531 Weakness: Secondary | ICD-10-CM | POA: Diagnosis not present

## 2021-01-07 DIAGNOSIS — M25651 Stiffness of right hip, not elsewhere classified: Secondary | ICD-10-CM | POA: Diagnosis not present

## 2021-01-07 DIAGNOSIS — M25652 Stiffness of left hip, not elsewhere classified: Secondary | ICD-10-CM | POA: Diagnosis not present

## 2021-01-12 DIAGNOSIS — R262 Difficulty in walking, not elsewhere classified: Secondary | ICD-10-CM | POA: Diagnosis not present

## 2021-01-12 DIAGNOSIS — M25652 Stiffness of left hip, not elsewhere classified: Secondary | ICD-10-CM | POA: Diagnosis not present

## 2021-01-12 DIAGNOSIS — R531 Weakness: Secondary | ICD-10-CM | POA: Diagnosis not present

## 2021-01-12 DIAGNOSIS — Z4789 Encounter for other orthopedic aftercare: Secondary | ICD-10-CM | POA: Diagnosis not present

## 2021-01-12 DIAGNOSIS — R293 Abnormal posture: Secondary | ICD-10-CM | POA: Diagnosis not present

## 2021-01-12 DIAGNOSIS — Z96641 Presence of right artificial hip joint: Secondary | ICD-10-CM | POA: Diagnosis not present

## 2021-01-12 DIAGNOSIS — M25651 Stiffness of right hip, not elsewhere classified: Secondary | ICD-10-CM | POA: Diagnosis not present

## 2021-01-21 DIAGNOSIS — R531 Weakness: Secondary | ICD-10-CM | POA: Diagnosis not present

## 2021-01-21 DIAGNOSIS — R293 Abnormal posture: Secondary | ICD-10-CM | POA: Diagnosis not present

## 2021-01-21 DIAGNOSIS — M25651 Stiffness of right hip, not elsewhere classified: Secondary | ICD-10-CM | POA: Diagnosis not present

## 2021-01-21 DIAGNOSIS — R262 Difficulty in walking, not elsewhere classified: Secondary | ICD-10-CM | POA: Diagnosis not present

## 2021-01-21 DIAGNOSIS — Z4789 Encounter for other orthopedic aftercare: Secondary | ICD-10-CM | POA: Diagnosis not present

## 2021-01-21 DIAGNOSIS — Z96641 Presence of right artificial hip joint: Secondary | ICD-10-CM | POA: Diagnosis not present

## 2021-01-21 DIAGNOSIS — M25652 Stiffness of left hip, not elsewhere classified: Secondary | ICD-10-CM | POA: Diagnosis not present

## 2021-01-23 DIAGNOSIS — H00011 Hordeolum externum right upper eyelid: Secondary | ICD-10-CM | POA: Diagnosis not present

## 2021-01-25 DIAGNOSIS — F331 Major depressive disorder, recurrent, moderate: Secondary | ICD-10-CM | POA: Diagnosis not present

## 2021-01-25 DIAGNOSIS — F419 Anxiety disorder, unspecified: Secondary | ICD-10-CM | POA: Diagnosis not present

## 2021-01-26 DIAGNOSIS — H0011 Chalazion right upper eyelid: Secondary | ICD-10-CM | POA: Diagnosis not present

## 2021-01-26 DIAGNOSIS — F331 Major depressive disorder, recurrent, moderate: Secondary | ICD-10-CM | POA: Diagnosis not present

## 2021-01-28 DIAGNOSIS — R293 Abnormal posture: Secondary | ICD-10-CM | POA: Diagnosis not present

## 2021-01-28 DIAGNOSIS — M25652 Stiffness of left hip, not elsewhere classified: Secondary | ICD-10-CM | POA: Diagnosis not present

## 2021-01-28 DIAGNOSIS — M25651 Stiffness of right hip, not elsewhere classified: Secondary | ICD-10-CM | POA: Diagnosis not present

## 2021-01-28 DIAGNOSIS — Z4789 Encounter for other orthopedic aftercare: Secondary | ICD-10-CM | POA: Diagnosis not present

## 2021-01-28 DIAGNOSIS — R262 Difficulty in walking, not elsewhere classified: Secondary | ICD-10-CM | POA: Diagnosis not present

## 2021-01-28 DIAGNOSIS — Z96641 Presence of right artificial hip joint: Secondary | ICD-10-CM | POA: Diagnosis not present

## 2021-01-28 DIAGNOSIS — R531 Weakness: Secondary | ICD-10-CM | POA: Diagnosis not present

## 2021-02-08 DIAGNOSIS — R293 Abnormal posture: Secondary | ICD-10-CM | POA: Diagnosis not present

## 2021-02-08 DIAGNOSIS — M25651 Stiffness of right hip, not elsewhere classified: Secondary | ICD-10-CM | POA: Diagnosis not present

## 2021-02-08 DIAGNOSIS — R262 Difficulty in walking, not elsewhere classified: Secondary | ICD-10-CM | POA: Diagnosis not present

## 2021-02-08 DIAGNOSIS — M25652 Stiffness of left hip, not elsewhere classified: Secondary | ICD-10-CM | POA: Diagnosis not present

## 2021-02-08 DIAGNOSIS — R531 Weakness: Secondary | ICD-10-CM | POA: Diagnosis not present

## 2021-02-08 DIAGNOSIS — Z4789 Encounter for other orthopedic aftercare: Secondary | ICD-10-CM | POA: Diagnosis not present

## 2021-02-08 DIAGNOSIS — Z96641 Presence of right artificial hip joint: Secondary | ICD-10-CM | POA: Diagnosis not present

## 2021-02-09 ENCOUNTER — Ambulatory Visit (HOSPITAL_BASED_OUTPATIENT_CLINIC_OR_DEPARTMENT_OTHER): Payer: Medicare Other | Admitting: Nurse Practitioner

## 2021-02-14 DIAGNOSIS — F331 Major depressive disorder, recurrent, moderate: Secondary | ICD-10-CM | POA: Diagnosis not present

## 2021-02-14 DIAGNOSIS — F419 Anxiety disorder, unspecified: Secondary | ICD-10-CM | POA: Diagnosis not present

## 2021-02-15 DIAGNOSIS — E669 Obesity, unspecified: Secondary | ICD-10-CM | POA: Diagnosis not present

## 2021-02-15 DIAGNOSIS — E041 Nontoxic single thyroid nodule: Secondary | ICD-10-CM | POA: Diagnosis not present

## 2021-02-15 DIAGNOSIS — E559 Vitamin D deficiency, unspecified: Secondary | ICD-10-CM | POA: Diagnosis not present

## 2021-02-15 DIAGNOSIS — M4125 Other idiopathic scoliosis, thoracolumbar region: Secondary | ICD-10-CM | POA: Diagnosis not present

## 2021-02-15 DIAGNOSIS — I10 Essential (primary) hypertension: Secondary | ICD-10-CM | POA: Diagnosis not present

## 2021-02-15 DIAGNOSIS — F325 Major depressive disorder, single episode, in full remission: Secondary | ICD-10-CM | POA: Diagnosis not present

## 2021-02-15 DIAGNOSIS — E7800 Pure hypercholesterolemia, unspecified: Secondary | ICD-10-CM | POA: Diagnosis not present

## 2021-02-15 DIAGNOSIS — S63631A Sprain of interphalangeal joint of left index finger, initial encounter: Secondary | ICD-10-CM | POA: Diagnosis not present

## 2021-02-18 DIAGNOSIS — M25651 Stiffness of right hip, not elsewhere classified: Secondary | ICD-10-CM | POA: Diagnosis not present

## 2021-02-18 DIAGNOSIS — Z4789 Encounter for other orthopedic aftercare: Secondary | ICD-10-CM | POA: Diagnosis not present

## 2021-02-18 DIAGNOSIS — M25652 Stiffness of left hip, not elsewhere classified: Secondary | ICD-10-CM | POA: Diagnosis not present

## 2021-02-18 DIAGNOSIS — R262 Difficulty in walking, not elsewhere classified: Secondary | ICD-10-CM | POA: Diagnosis not present

## 2021-02-18 DIAGNOSIS — R293 Abnormal posture: Secondary | ICD-10-CM | POA: Diagnosis not present

## 2021-02-18 DIAGNOSIS — R531 Weakness: Secondary | ICD-10-CM | POA: Diagnosis not present

## 2021-02-18 DIAGNOSIS — Z96641 Presence of right artificial hip joint: Secondary | ICD-10-CM | POA: Diagnosis not present

## 2021-02-23 DIAGNOSIS — S62640A Nondisplaced fracture of proximal phalanx of right index finger, initial encounter for closed fracture: Secondary | ICD-10-CM | POA: Diagnosis not present

## 2021-02-23 DIAGNOSIS — S63630A Sprain of interphalangeal joint of right index finger, initial encounter: Secondary | ICD-10-CM | POA: Diagnosis not present

## 2021-02-23 DIAGNOSIS — M19041 Primary osteoarthritis, right hand: Secondary | ICD-10-CM | POA: Diagnosis not present

## 2021-02-23 DIAGNOSIS — S66110A Strain of flexor muscle, fascia and tendon of right index finger at wrist and hand level, initial encounter: Secondary | ICD-10-CM | POA: Diagnosis not present

## 2021-02-23 DIAGNOSIS — M79644 Pain in right finger(s): Secondary | ICD-10-CM | POA: Diagnosis not present

## 2021-02-25 DIAGNOSIS — Z4789 Encounter for other orthopedic aftercare: Secondary | ICD-10-CM | POA: Diagnosis not present

## 2021-02-25 DIAGNOSIS — R293 Abnormal posture: Secondary | ICD-10-CM | POA: Diagnosis not present

## 2021-02-25 DIAGNOSIS — M25651 Stiffness of right hip, not elsewhere classified: Secondary | ICD-10-CM | POA: Diagnosis not present

## 2021-02-25 DIAGNOSIS — Z96641 Presence of right artificial hip joint: Secondary | ICD-10-CM | POA: Diagnosis not present

## 2021-02-25 DIAGNOSIS — M25652 Stiffness of left hip, not elsewhere classified: Secondary | ICD-10-CM | POA: Diagnosis not present

## 2021-02-25 DIAGNOSIS — R262 Difficulty in walking, not elsewhere classified: Secondary | ICD-10-CM | POA: Diagnosis not present

## 2021-02-25 DIAGNOSIS — R531 Weakness: Secondary | ICD-10-CM | POA: Diagnosis not present

## 2021-03-04 DIAGNOSIS — Z96641 Presence of right artificial hip joint: Secondary | ICD-10-CM | POA: Diagnosis not present

## 2021-03-04 DIAGNOSIS — Z4789 Encounter for other orthopedic aftercare: Secondary | ICD-10-CM | POA: Diagnosis not present

## 2021-03-04 DIAGNOSIS — M25652 Stiffness of left hip, not elsewhere classified: Secondary | ICD-10-CM | POA: Diagnosis not present

## 2021-03-04 DIAGNOSIS — R293 Abnormal posture: Secondary | ICD-10-CM | POA: Diagnosis not present

## 2021-03-04 DIAGNOSIS — M25651 Stiffness of right hip, not elsewhere classified: Secondary | ICD-10-CM | POA: Diagnosis not present

## 2021-03-04 DIAGNOSIS — R531 Weakness: Secondary | ICD-10-CM | POA: Diagnosis not present

## 2021-03-04 DIAGNOSIS — R262 Difficulty in walking, not elsewhere classified: Secondary | ICD-10-CM | POA: Diagnosis not present

## 2021-03-08 DIAGNOSIS — M4155 Other secondary scoliosis, thoracolumbar region: Secondary | ICD-10-CM | POA: Diagnosis not present

## 2021-03-09 ENCOUNTER — Other Ambulatory Visit: Payer: Self-pay | Admitting: Neurosurgery

## 2021-03-09 DIAGNOSIS — M4155 Other secondary scoliosis, thoracolumbar region: Secondary | ICD-10-CM

## 2021-03-10 DIAGNOSIS — F331 Major depressive disorder, recurrent, moderate: Secondary | ICD-10-CM | POA: Diagnosis not present

## 2021-03-10 DIAGNOSIS — F419 Anxiety disorder, unspecified: Secondary | ICD-10-CM | POA: Diagnosis not present

## 2021-03-11 DIAGNOSIS — R262 Difficulty in walking, not elsewhere classified: Secondary | ICD-10-CM | POA: Diagnosis not present

## 2021-03-11 DIAGNOSIS — R531 Weakness: Secondary | ICD-10-CM | POA: Diagnosis not present

## 2021-03-11 DIAGNOSIS — M25651 Stiffness of right hip, not elsewhere classified: Secondary | ICD-10-CM | POA: Diagnosis not present

## 2021-03-11 DIAGNOSIS — Z96641 Presence of right artificial hip joint: Secondary | ICD-10-CM | POA: Diagnosis not present

## 2021-03-11 DIAGNOSIS — R293 Abnormal posture: Secondary | ICD-10-CM | POA: Diagnosis not present

## 2021-03-11 DIAGNOSIS — M25652 Stiffness of left hip, not elsewhere classified: Secondary | ICD-10-CM | POA: Diagnosis not present

## 2021-03-11 DIAGNOSIS — Z4789 Encounter for other orthopedic aftercare: Secondary | ICD-10-CM | POA: Diagnosis not present

## 2021-03-16 DIAGNOSIS — S62640D Nondisplaced fracture of proximal phalanx of right index finger, subsequent encounter for fracture with routine healing: Secondary | ICD-10-CM | POA: Diagnosis not present

## 2021-03-16 DIAGNOSIS — S66310S Strain of extensor muscle, fascia and tendon of right index finger at wrist and hand level, sequela: Secondary | ICD-10-CM | POA: Diagnosis not present

## 2021-03-18 DIAGNOSIS — Z4789 Encounter for other orthopedic aftercare: Secondary | ICD-10-CM | POA: Diagnosis not present

## 2021-03-18 DIAGNOSIS — M25652 Stiffness of left hip, not elsewhere classified: Secondary | ICD-10-CM | POA: Diagnosis not present

## 2021-03-18 DIAGNOSIS — R531 Weakness: Secondary | ICD-10-CM | POA: Diagnosis not present

## 2021-03-18 DIAGNOSIS — R293 Abnormal posture: Secondary | ICD-10-CM | POA: Diagnosis not present

## 2021-03-18 DIAGNOSIS — Z96641 Presence of right artificial hip joint: Secondary | ICD-10-CM | POA: Diagnosis not present

## 2021-03-18 DIAGNOSIS — R262 Difficulty in walking, not elsewhere classified: Secondary | ICD-10-CM | POA: Diagnosis not present

## 2021-03-18 DIAGNOSIS — M25651 Stiffness of right hip, not elsewhere classified: Secondary | ICD-10-CM | POA: Diagnosis not present

## 2021-03-23 DIAGNOSIS — M25651 Stiffness of right hip, not elsewhere classified: Secondary | ICD-10-CM | POA: Diagnosis not present

## 2021-03-23 DIAGNOSIS — M25652 Stiffness of left hip, not elsewhere classified: Secondary | ICD-10-CM | POA: Diagnosis not present

## 2021-03-23 DIAGNOSIS — Z4789 Encounter for other orthopedic aftercare: Secondary | ICD-10-CM | POA: Diagnosis not present

## 2021-03-23 DIAGNOSIS — Z96641 Presence of right artificial hip joint: Secondary | ICD-10-CM | POA: Diagnosis not present

## 2021-03-23 DIAGNOSIS — R262 Difficulty in walking, not elsewhere classified: Secondary | ICD-10-CM | POA: Diagnosis not present

## 2021-03-23 DIAGNOSIS — R293 Abnormal posture: Secondary | ICD-10-CM | POA: Diagnosis not present

## 2021-03-23 DIAGNOSIS — R531 Weakness: Secondary | ICD-10-CM | POA: Diagnosis not present

## 2021-03-30 DIAGNOSIS — S62640A Nondisplaced fracture of proximal phalanx of right index finger, initial encounter for closed fracture: Secondary | ICD-10-CM | POA: Diagnosis not present

## 2021-03-30 DIAGNOSIS — S66310S Strain of extensor muscle, fascia and tendon of right index finger at wrist and hand level, sequela: Secondary | ICD-10-CM | POA: Diagnosis not present

## 2021-03-30 DIAGNOSIS — R52 Pain, unspecified: Secondary | ICD-10-CM | POA: Diagnosis not present

## 2021-03-30 DIAGNOSIS — M25641 Stiffness of right hand, not elsewhere classified: Secondary | ICD-10-CM | POA: Diagnosis not present

## 2021-03-30 DIAGNOSIS — S62640D Nondisplaced fracture of proximal phalanx of right index finger, subsequent encounter for fracture with routine healing: Secondary | ICD-10-CM | POA: Diagnosis not present

## 2021-03-30 DIAGNOSIS — E7800 Pure hypercholesterolemia, unspecified: Secondary | ICD-10-CM | POA: Diagnosis not present

## 2021-03-30 DIAGNOSIS — S66110A Strain of flexor muscle, fascia and tendon of right index finger at wrist and hand level, initial encounter: Secondary | ICD-10-CM | POA: Diagnosis not present

## 2021-03-30 DIAGNOSIS — I10 Essential (primary) hypertension: Secondary | ICD-10-CM | POA: Diagnosis not present

## 2021-03-30 DIAGNOSIS — S63630A Sprain of interphalangeal joint of right index finger, initial encounter: Secondary | ICD-10-CM | POA: Diagnosis not present

## 2021-04-01 DIAGNOSIS — R262 Difficulty in walking, not elsewhere classified: Secondary | ICD-10-CM | POA: Diagnosis not present

## 2021-04-01 DIAGNOSIS — R531 Weakness: Secondary | ICD-10-CM | POA: Diagnosis not present

## 2021-04-01 DIAGNOSIS — M25651 Stiffness of right hip, not elsewhere classified: Secondary | ICD-10-CM | POA: Diagnosis not present

## 2021-04-01 DIAGNOSIS — R293 Abnormal posture: Secondary | ICD-10-CM | POA: Diagnosis not present

## 2021-04-01 DIAGNOSIS — Z96641 Presence of right artificial hip joint: Secondary | ICD-10-CM | POA: Diagnosis not present

## 2021-04-01 DIAGNOSIS — M25652 Stiffness of left hip, not elsewhere classified: Secondary | ICD-10-CM | POA: Diagnosis not present

## 2021-04-01 DIAGNOSIS — Z4789 Encounter for other orthopedic aftercare: Secondary | ICD-10-CM | POA: Diagnosis not present

## 2021-04-04 ENCOUNTER — Other Ambulatory Visit: Payer: Self-pay

## 2021-04-04 ENCOUNTER — Ambulatory Visit
Admission: RE | Admit: 2021-04-04 | Discharge: 2021-04-04 | Disposition: A | Discharge status: Home / Self Care | Payer: Medicare Other | Source: Ambulatory Visit | Attending: Neurosurgery | Admitting: Neurosurgery

## 2021-04-04 DIAGNOSIS — M549 Dorsalgia, unspecified: Secondary | ICD-10-CM | POA: Diagnosis not present

## 2021-04-04 DIAGNOSIS — M4155 Other secondary scoliosis, thoracolumbar region: Secondary | ICD-10-CM

## 2021-04-04 DIAGNOSIS — M545 Low back pain, unspecified: Secondary | ICD-10-CM | POA: Diagnosis not present

## 2021-04-07 DIAGNOSIS — M4155 Other secondary scoliosis, thoracolumbar region: Secondary | ICD-10-CM | POA: Diagnosis not present

## 2021-04-08 ENCOUNTER — Other Ambulatory Visit: Payer: Self-pay | Admitting: Endocrinology

## 2021-04-08 ENCOUNTER — Telehealth: Payer: Self-pay

## 2021-04-08 DIAGNOSIS — E042 Nontoxic multinodular goiter: Secondary | ICD-10-CM

## 2021-04-08 DIAGNOSIS — Z4789 Encounter for other orthopedic aftercare: Secondary | ICD-10-CM | POA: Diagnosis not present

## 2021-04-08 DIAGNOSIS — Z96641 Presence of right artificial hip joint: Secondary | ICD-10-CM | POA: Diagnosis not present

## 2021-04-08 DIAGNOSIS — R262 Difficulty in walking, not elsewhere classified: Secondary | ICD-10-CM | POA: Diagnosis not present

## 2021-04-08 DIAGNOSIS — M25651 Stiffness of right hip, not elsewhere classified: Secondary | ICD-10-CM | POA: Diagnosis not present

## 2021-04-08 DIAGNOSIS — M25652 Stiffness of left hip, not elsewhere classified: Secondary | ICD-10-CM | POA: Diagnosis not present

## 2021-04-08 DIAGNOSIS — R531 Weakness: Secondary | ICD-10-CM | POA: Diagnosis not present

## 2021-04-08 DIAGNOSIS — R293 Abnormal posture: Secondary | ICD-10-CM | POA: Diagnosis not present

## 2021-04-08 NOTE — Telephone Encounter (Signed)
Patient left a voicemail regarding her last thyroid US that resulted in finding of growth on her right thyroid. She wants to have this rechecked as she was last seen on 07/15/20 ?

## 2021-04-08 NOTE — Telephone Encounter (Signed)
Patient now informed that [place] has been ordered ?

## 2021-04-11 DIAGNOSIS — F331 Major depressive disorder, recurrent, moderate: Secondary | ICD-10-CM | POA: Diagnosis not present

## 2021-04-11 DIAGNOSIS — F419 Anxiety disorder, unspecified: Secondary | ICD-10-CM | POA: Diagnosis not present

## 2021-04-15 DIAGNOSIS — M25651 Stiffness of right hip, not elsewhere classified: Secondary | ICD-10-CM | POA: Diagnosis not present

## 2021-04-15 DIAGNOSIS — R262 Difficulty in walking, not elsewhere classified: Secondary | ICD-10-CM | POA: Diagnosis not present

## 2021-04-15 DIAGNOSIS — M25652 Stiffness of left hip, not elsewhere classified: Secondary | ICD-10-CM | POA: Diagnosis not present

## 2021-04-15 DIAGNOSIS — R293 Abnormal posture: Secondary | ICD-10-CM | POA: Diagnosis not present

## 2021-04-15 DIAGNOSIS — Z4789 Encounter for other orthopedic aftercare: Secondary | ICD-10-CM | POA: Diagnosis not present

## 2021-04-15 DIAGNOSIS — R531 Weakness: Secondary | ICD-10-CM | POA: Diagnosis not present

## 2021-04-15 DIAGNOSIS — Z96641 Presence of right artificial hip joint: Secondary | ICD-10-CM | POA: Diagnosis not present

## 2021-04-19 ENCOUNTER — Ambulatory Visit
Admission: RE | Admit: 2021-04-19 | Discharge: 2021-04-19 | Disposition: A | Discharge status: Home / Self Care | Payer: Medicare Other | Source: Ambulatory Visit | Attending: Endocrinology | Admitting: Endocrinology

## 2021-04-19 DIAGNOSIS — E042 Nontoxic multinodular goiter: Secondary | ICD-10-CM

## 2021-04-20 DIAGNOSIS — S63630A Sprain of interphalangeal joint of right index finger, initial encounter: Secondary | ICD-10-CM | POA: Diagnosis not present

## 2021-04-20 DIAGNOSIS — S66310S Strain of extensor muscle, fascia and tendon of right index finger at wrist and hand level, sequela: Secondary | ICD-10-CM | POA: Diagnosis not present

## 2021-04-20 DIAGNOSIS — S62640D Nondisplaced fracture of proximal phalanx of right index finger, subsequent encounter for fracture with routine healing: Secondary | ICD-10-CM | POA: Diagnosis not present

## 2021-04-20 DIAGNOSIS — M25641 Stiffness of right hand, not elsewhere classified: Secondary | ICD-10-CM | POA: Diagnosis not present

## 2021-04-20 DIAGNOSIS — R52 Pain, unspecified: Secondary | ICD-10-CM | POA: Diagnosis not present

## 2021-04-22 DIAGNOSIS — R531 Weakness: Secondary | ICD-10-CM | POA: Diagnosis not present

## 2021-04-22 DIAGNOSIS — Z4789 Encounter for other orthopedic aftercare: Secondary | ICD-10-CM | POA: Diagnosis not present

## 2021-04-22 DIAGNOSIS — M25652 Stiffness of left hip, not elsewhere classified: Secondary | ICD-10-CM | POA: Diagnosis not present

## 2021-04-22 DIAGNOSIS — R293 Abnormal posture: Secondary | ICD-10-CM | POA: Diagnosis not present

## 2021-04-22 DIAGNOSIS — M25651 Stiffness of right hip, not elsewhere classified: Secondary | ICD-10-CM | POA: Diagnosis not present

## 2021-04-22 DIAGNOSIS — R262 Difficulty in walking, not elsewhere classified: Secondary | ICD-10-CM | POA: Diagnosis not present

## 2021-04-22 DIAGNOSIS — Z96641 Presence of right artificial hip joint: Secondary | ICD-10-CM | POA: Diagnosis not present

## 2021-04-25 DIAGNOSIS — F331 Major depressive disorder, recurrent, moderate: Secondary | ICD-10-CM | POA: Diagnosis not present

## 2021-04-27 DIAGNOSIS — S62640D Nondisplaced fracture of proximal phalanx of right index finger, subsequent encounter for fracture with routine healing: Secondary | ICD-10-CM | POA: Diagnosis not present

## 2021-04-27 DIAGNOSIS — R52 Pain, unspecified: Secondary | ICD-10-CM | POA: Diagnosis not present

## 2021-04-27 DIAGNOSIS — M25641 Stiffness of right hand, not elsewhere classified: Secondary | ICD-10-CM | POA: Diagnosis not present

## 2021-05-05 DIAGNOSIS — M47814 Spondylosis without myelopathy or radiculopathy, thoracic region: Secondary | ICD-10-CM | POA: Diagnosis not present

## 2021-05-09 DIAGNOSIS — F419 Anxiety disorder, unspecified: Secondary | ICD-10-CM | POA: Diagnosis not present

## 2021-05-09 DIAGNOSIS — E559 Vitamin D deficiency, unspecified: Secondary | ICD-10-CM | POA: Diagnosis not present

## 2021-05-09 DIAGNOSIS — I10 Essential (primary) hypertension: Secondary | ICD-10-CM | POA: Diagnosis not present

## 2021-05-09 DIAGNOSIS — F331 Major depressive disorder, recurrent, moderate: Secondary | ICD-10-CM | POA: Diagnosis not present

## 2021-05-11 DIAGNOSIS — R293 Abnormal posture: Secondary | ICD-10-CM | POA: Diagnosis not present

## 2021-05-11 DIAGNOSIS — R262 Difficulty in walking, not elsewhere classified: Secondary | ICD-10-CM | POA: Diagnosis not present

## 2021-05-11 DIAGNOSIS — M25652 Stiffness of left hip, not elsewhere classified: Secondary | ICD-10-CM | POA: Diagnosis not present

## 2021-05-11 DIAGNOSIS — M25651 Stiffness of right hip, not elsewhere classified: Secondary | ICD-10-CM | POA: Diagnosis not present

## 2021-05-11 DIAGNOSIS — Z4789 Encounter for other orthopedic aftercare: Secondary | ICD-10-CM | POA: Diagnosis not present

## 2021-05-11 DIAGNOSIS — Z96641 Presence of right artificial hip joint: Secondary | ICD-10-CM | POA: Diagnosis not present

## 2021-05-11 DIAGNOSIS — R531 Weakness: Secondary | ICD-10-CM | POA: Diagnosis not present

## 2021-05-12 DIAGNOSIS — F325 Major depressive disorder, single episode, in full remission: Secondary | ICD-10-CM | POA: Diagnosis not present

## 2021-05-12 DIAGNOSIS — E7800 Pure hypercholesterolemia, unspecified: Secondary | ICD-10-CM | POA: Diagnosis not present

## 2021-05-12 DIAGNOSIS — M1990 Unspecified osteoarthritis, unspecified site: Secondary | ICD-10-CM | POA: Diagnosis not present

## 2021-05-12 DIAGNOSIS — I10 Essential (primary) hypertension: Secondary | ICD-10-CM | POA: Diagnosis not present

## 2021-05-13 DIAGNOSIS — Z96641 Presence of right artificial hip joint: Secondary | ICD-10-CM | POA: Diagnosis not present

## 2021-05-13 DIAGNOSIS — R262 Difficulty in walking, not elsewhere classified: Secondary | ICD-10-CM | POA: Diagnosis not present

## 2021-05-13 DIAGNOSIS — R293 Abnormal posture: Secondary | ICD-10-CM | POA: Diagnosis not present

## 2021-05-13 DIAGNOSIS — R531 Weakness: Secondary | ICD-10-CM | POA: Diagnosis not present

## 2021-05-13 DIAGNOSIS — M25651 Stiffness of right hip, not elsewhere classified: Secondary | ICD-10-CM | POA: Diagnosis not present

## 2021-05-13 DIAGNOSIS — M25652 Stiffness of left hip, not elsewhere classified: Secondary | ICD-10-CM | POA: Diagnosis not present

## 2021-05-13 DIAGNOSIS — Z4789 Encounter for other orthopedic aftercare: Secondary | ICD-10-CM | POA: Diagnosis not present

## 2021-05-23 NOTE — Telephone Encounter (Signed)
err

## 2021-05-24 ENCOUNTER — Ambulatory Visit: Payer: Medicare Other | Admitting: Orthopaedic Surgery

## 2021-05-26 DIAGNOSIS — M19041 Primary osteoarthritis, right hand: Secondary | ICD-10-CM | POA: Diagnosis not present

## 2021-05-26 DIAGNOSIS — Z0000 Encounter for general adult medical examination without abnormal findings: Secondary | ICD-10-CM | POA: Diagnosis not present

## 2021-05-26 DIAGNOSIS — I10 Essential (primary) hypertension: Secondary | ICD-10-CM | POA: Diagnosis not present

## 2021-05-26 DIAGNOSIS — M1990 Unspecified osteoarthritis, unspecified site: Secondary | ICD-10-CM | POA: Diagnosis not present

## 2021-05-26 DIAGNOSIS — F325 Major depressive disorder, single episode, in full remission: Secondary | ICD-10-CM | POA: Diagnosis not present

## 2021-05-26 DIAGNOSIS — E7800 Pure hypercholesterolemia, unspecified: Secondary | ICD-10-CM | POA: Diagnosis not present

## 2021-05-26 DIAGNOSIS — G4733 Obstructive sleep apnea (adult) (pediatric): Secondary | ICD-10-CM | POA: Diagnosis not present

## 2021-05-26 DIAGNOSIS — N393 Stress incontinence (female) (male): Secondary | ICD-10-CM | POA: Diagnosis not present

## 2021-05-26 DIAGNOSIS — G459 Transient cerebral ischemic attack, unspecified: Secondary | ICD-10-CM | POA: Diagnosis not present

## 2021-05-30 ENCOUNTER — Ambulatory Visit: Payer: Medicare Other | Admitting: Orthopaedic Surgery

## 2021-05-31 DIAGNOSIS — M47814 Spondylosis without myelopathy or radiculopathy, thoracic region: Secondary | ICD-10-CM | POA: Diagnosis not present

## 2021-06-01 DIAGNOSIS — M25652 Stiffness of left hip, not elsewhere classified: Secondary | ICD-10-CM | POA: Diagnosis not present

## 2021-06-01 DIAGNOSIS — R293 Abnormal posture: Secondary | ICD-10-CM | POA: Diagnosis not present

## 2021-06-01 DIAGNOSIS — Z96641 Presence of right artificial hip joint: Secondary | ICD-10-CM | POA: Diagnosis not present

## 2021-06-01 DIAGNOSIS — R262 Difficulty in walking, not elsewhere classified: Secondary | ICD-10-CM | POA: Diagnosis not present

## 2021-06-01 DIAGNOSIS — M25651 Stiffness of right hip, not elsewhere classified: Secondary | ICD-10-CM | POA: Diagnosis not present

## 2021-06-01 DIAGNOSIS — Z4789 Encounter for other orthopedic aftercare: Secondary | ICD-10-CM | POA: Diagnosis not present

## 2021-06-01 DIAGNOSIS — R531 Weakness: Secondary | ICD-10-CM | POA: Diagnosis not present

## 2021-06-08 DIAGNOSIS — R262 Difficulty in walking, not elsewhere classified: Secondary | ICD-10-CM | POA: Diagnosis not present

## 2021-06-08 DIAGNOSIS — R531 Weakness: Secondary | ICD-10-CM | POA: Diagnosis not present

## 2021-06-08 DIAGNOSIS — M25652 Stiffness of left hip, not elsewhere classified: Secondary | ICD-10-CM | POA: Diagnosis not present

## 2021-06-08 DIAGNOSIS — Z96641 Presence of right artificial hip joint: Secondary | ICD-10-CM | POA: Diagnosis not present

## 2021-06-08 DIAGNOSIS — Z4789 Encounter for other orthopedic aftercare: Secondary | ICD-10-CM | POA: Diagnosis not present

## 2021-06-08 DIAGNOSIS — M25651 Stiffness of right hip, not elsewhere classified: Secondary | ICD-10-CM | POA: Diagnosis not present

## 2021-06-08 DIAGNOSIS — R293 Abnormal posture: Secondary | ICD-10-CM | POA: Diagnosis not present

## 2021-06-13 DIAGNOSIS — F419 Anxiety disorder, unspecified: Secondary | ICD-10-CM | POA: Diagnosis not present

## 2021-06-13 DIAGNOSIS — F331 Major depressive disorder, recurrent, moderate: Secondary | ICD-10-CM | POA: Diagnosis not present

## 2021-06-15 ENCOUNTER — Ambulatory Visit (INDEPENDENT_AMBULATORY_CARE_PROVIDER_SITE_OTHER): Payer: Medicare Other | Admitting: Orthopaedic Surgery

## 2021-06-15 ENCOUNTER — Ambulatory Visit (INDEPENDENT_AMBULATORY_CARE_PROVIDER_SITE_OTHER): Payer: Medicare Other

## 2021-06-15 ENCOUNTER — Encounter: Payer: Self-pay | Admitting: Orthopaedic Surgery

## 2021-06-15 DIAGNOSIS — Z96641 Presence of right artificial hip joint: Secondary | ICD-10-CM | POA: Diagnosis not present

## 2021-06-15 DIAGNOSIS — R29898 Other symptoms and signs involving the musculoskeletal system: Secondary | ICD-10-CM

## 2021-06-15 NOTE — Progress Notes (Signed)
HPI: [name] comes in today status post right total hip arthroplasty revision 10/07/2020.  She unfortunately had a leg length discrepancy and had to undergo revision.  Surgery was done by another surgeon here in town initially.  Leg lengths were approximately an inch off.  She is continuing to go to therapy for weakness of her right hip flexor and weakness of her left hip abductors.  She feels therapy has been quite beneficial she is asking to go back to therapy.  She is still walking with a cane.  She also has back issues with retained hardware from multiple level fusion.  Notes some pain in her left hip groin area with prolonged walking.  Review of systems: See HPI otherwise negative  Physical exam: General: Well-developed well-nourished female no acute distress.  Bilateral hips good range of motion of both hips.  She has slightly limited right hip flexion actively.  Tenderness over the right hip trochanteric region.  Left hip good range of motion without pain.  Radiographs AP pelvis: Status post right total hip arthroplasty with well-seated components.  Mild arthritic changes involving the left hip but overall is well-preserved no AVN.  No acute fractures.  Impression: Bilateral hip weakness Status post right total hip arthroplasty revision 10/08/2018  Plan: We will have her continue therapy for strengthening both hips balance and gait.  In regards to the left hip she is not having groin pain to the point that she would like to consider injection but she may consider injection in the future is to be an intra-articular injection.  Questions were encouraged and answered at length by Dr. Delilah Shan myself.  She will follow-up with [place] as needed.

## 2021-06-17 NOTE — Therapy (Unsigned)
OUTPATIENT PHYSICAL THERAPY FEMALE PELVIC EVALUATION   Patient Name: [name] MRN: [id number] DOB:1945/08/27, 76 y.o., female Today's Date: 06/17/2021    Past Medical History:  Diagnosis Date   Anemia    Congenital musculoskeletal deformity of spine    Depression    Headache    Hypertension    Incontinence of bowel 09/2013   Lumbago    Lumbosacral spondylosis without myelopathy    Sleep apnea    Sleep disorder    uses gabapentin at bedtime    Spinal stenosis, lumbar region, without neurogenic claudication    Past Surgical History:  Procedure Laterality Date   ANKLE SURGERY Right 2015   lengthened the achilles    ANTERIOR HIP REVISION Right 10/07/2020   Procedure: RIGHT HIP REVISION OF  HIP BALL/POLY-LINER;  Surgeon: Mcarthur Rossetti, MD;  Location: [facility];  Service: Orthopedics;  Laterality: Right;  RNFA PLEASE   AUGMENTATION MAMMAPLASTY Bilateral 2000 or 2001 unsure    Left Implant has busted and is no longer visible   BACK SURGERY  2015   fusion , Dr Patrice Paradise at [facility]; has 2 metal rods in place , fusion from t2 to s1    BACK SURGERY  01/2017   Dr Rennis Harding ; repair of cracked titanium rod in back lumbar    BREAST EXCISIONAL BIOPSY Left    30 yrs ago   bunionectomy  Bilateral 1980s   multiple    COLONOSCOPY     with polypectomy ; q 5 years    FOOT SURGERY  07/2011   SHOULDER SURGERY Left 2013   rotator cuff    TOTAL HIP ARTHROPLASTY Right 05/08/2017   Procedure: RIGHT TOTAL HIP ARTHROPLASTY ANTERIOR APPROACH;  Surgeon: Paralee Cancel, MD;  Location: [facility];  Service: Orthopedics;  Laterality: Right;  70 mins   Patient Active Problem List   Diagnosis Date Noted   Leg length discrepancy, Right 10/07/2020   History of revision of total replacement of right hip joint 10/07/2020   Multinodular goiter 05/18/2020   Hip contracture, unspecified laterality 07/02/2018   Overweight (BMI 25.0-29.9) 05/09/2017   S/P right THA, AA 05/08/2017   Chronic  bilateral low back pain without sciatica 04/05/2015   Disorder of sacroiliac joint 12/22/2014   Chronic low back pain 12/22/2014   Acquired scoliosis 11/27/2011   Lumbosacral spondylosis without myelopathy 05/22/2011    PCP: ***  REFERRING PROVIDER: Marda Stalker, PA-C  REFERRING DIAG: N39.3 Stress Incontinence of urine  THERAPY DIAG:  No diagnosis found.  Rationale for Evaluation and Treatment Rehabilitation  ONSET DATE: ***  SUBJECTIVE:  SUBJECTIVE STATEMENT: *** Fluid intake: {Yes/No:304960894}   Patient confirms identification and approves PT to assess pelvic floor and treatment {yes/no:20286}   PAIN:  Are you having pain? {yes/no:20286} NPRS scale: ***/10 Pain location: {pelvic pain location:27098}  Pain type: {type:313116} Pain description: {PAIN DESCRIPTION:21022940}   Aggravating factors: *** Relieving factors: ***  PRECAUTIONS: {Therapy precautions:24002}  WEIGHT BEARING RESTRICTIONS {Yes ***/No:24003}  FALLS:  Has patient fallen in last 6 months? {fallsyesno:27318}  LIVING ENVIRONMENT: Lives with: {[facility] lives with:25569::"lives with their family"} Lives in: {Lives in:25570} Stairs: {opstairs:27293} Has following equipment at home: {Assistive devices:23999}  OCCUPATION: ***  PLOF: {PLOF:24004}  PATIENT GOALS ***  PERTINENT HISTORY:  [place] on right on 10/07/2020; Back surgery x2; TIA Sexual abuse: {Yes/No:304960894}  BOWEL MOVEMENT Pain with bowel movement: {yes/no:20286} Type of bowel movement:{PT BM type:27100} Fully empty rectum: {Yes/No:304960894} Leakage: {Yes/No:304960894} Pads: {Yes/No:304960894} Fiber supplement: {Yes/No:304960894}  URINATION Pain with urination: {yes/no:20286} Fully empty bladder: {Yes/No:304960894} Stream:  {PT urination:27102} Urgency: {Yes/No:304960894} Frequency: *** Leakage: {PT leakage:27103} Pads: {Yes/No:304960894}  INTERCOURSE Pain with intercourse: {pain with intercourse PA:27099} Ability to have vaginal penetration:  {Yes/No:304960894} Climax: *** Marinoff Scale: ***/3  PREGNANCY Vaginal deliveries *** Tearing {Yes***/No:304960894} C-section deliveries *** Currently pregnant {Yes***/No:304960894}  PROLAPSE {PT prolapse:27101}    OBJECTIVE:   DIAGNOSTIC FINDINGS:  ***  PATIENT SURVEYS:  {rehab surveys:24030}  PFIQ-7 ***  COGNITION:  Overall cognitive status: {cognition:24006}     SENSATION:  Light touch: {intact/deficits:24005}  Proprioception: {intact/deficits:24005}  MUSCLE LENGTH: Hamstrings: Right *** deg; Left *** deg Thomas test: Right *** deg; Left *** deg  LUMBAR SPECIAL TESTS:  {lumbar special test:25242}  FUNCTIONAL TESTS:  {Functional tests:24029}  GAIT: Distance walked: *** Assistive device utilized: {Assistive devices:23999} Level of assistance: {Levels of assistance:24026} Comments: ***  POSTURE:  ***  LUMBARAROM/PROM  A/PROM A/PROM  eval  Flexion   Extension   Right lateral flexion   Left lateral flexion   Right rotation   Left rotation    (Blank rows = not tested)  LOWER EXTREMITY ROM:  {AROM/PROM:27142} ROM Right eval Left eval  Hip flexion    Hip extension    Hip abduction    Hip adduction    Hip internal rotation    Hip external rotation    Knee flexion    Knee extension    Ankle dorsiflexion    Ankle plantarflexion    Ankle inversion    Ankle eversion     (Blank rows = not tested)  LOWER EXTREMITY MMT:  MMT Right eval Left eval  Hip flexion    Hip extension    Hip abduction    Hip adduction    Hip internal rotation    Hip external rotation    Knee flexion    Knee extension    Ankle dorsiflexion    Ankle plantarflexion    Ankle inversion    Ankle eversion     PELVIC MMT:   MMT eval   Vaginal   Internal Anal Sphincter   External Anal Sphincter   Puborectalis   Diastasis Recti   (Blank rows = not tested)        PALPATION:   General  ***                External Perineal Exam ***                             Internal Pelvic Floor ***  TONE: ***  PROLAPSE: ***  TODAY'S TREATMENT  EVAL ***   PATIENT EDUCATION:  Education details: *** Person educated: {Person educated:25204} Education method: {Education Method:25205} Education comprehension: {Education Comprehension:25206}   HOME EXERCISE PROGRAM: ***  ASSESSMENT:  CLINICAL IMPRESSION: Patient is a *** y.o. *** who was seen today for physical therapy evaluation and treatment for ***.    OBJECTIVE IMPAIRMENTS {opptimpairments:25111}.   ACTIVITY LIMITATIONS {activitylimitations:27494}  PARTICIPATION LIMITATIONS: {participationrestrictions:25113}  PERSONAL FACTORS {Personal factors:25162} are also affecting patient's functional outcome.   REHAB POTENTIAL: {rehabpotential:25112}  CLINICAL DECISION MAKING: {clinical decision making:25114}  EVALUATION COMPLEXITY: {Evaluation complexity:25115}   GOALS: Goals reviewed with patient? {yes/no:20286}  SHORT TERM GOALS: Target date: {follow up:25551}  *** Baseline: Goal status: {GOALSTATUS:25110}  2.  *** Baseline:  Goal status: {GOALSTATUS:25110}  3.  *** Baseline:  Goal status: {GOALSTATUS:25110}  4.  *** Baseline:  Goal status: {GOALSTATUS:25110}  5.  *** Baseline:  Goal status: {GOALSTATUS:25110}  6.  *** Baseline:  Goal status: {GOALSTATUS:25110}  LONG TERM GOALS: Target date: {follow up:25551}   *** Baseline:  Goal status: {GOALSTATUS:25110}  2.  *** Baseline:  Goal status: {GOALSTATUS:25110}  3.  *** Baseline:  Goal status: {GOALSTATUS:25110}  4.  *** Baseline:  Goal status: {GOALSTATUS:25110}  5.  *** Baseline:  Goal status: {GOALSTATUS:25110}  6.  *** Baseline:  Goal status:  {GOALSTATUS:25110}  PLAN: PT FREQUENCY: {rehab frequency:25116}  PT DURATION: {rehab duration:25117}  PLANNED INTERVENTIONS: {rehab planned interventions:25118::"Therapeutic exercises","Therapeutic activity","Neuromuscular re-education","Balance training","Gait training","Patient/Family education","Joint mobilization"}  PLAN FOR NEXT SESSION: ***   Baili Stang, PT 06/17/2021, 11:33 AM

## 2021-06-20 ENCOUNTER — Encounter: Payer: Self-pay | Admitting: Physical Therapy

## 2021-06-20 ENCOUNTER — Ambulatory Visit: Payer: Medicare Other | Attending: Family Medicine | Admitting: Physical Therapy

## 2021-06-20 DIAGNOSIS — M6281 Muscle weakness (generalized): Secondary | ICD-10-CM | POA: Insufficient documentation

## 2021-06-20 DIAGNOSIS — R278 Other lack of coordination: Secondary | ICD-10-CM | POA: Insufficient documentation

## 2021-06-20 NOTE — Patient Instructions (Signed)
About Pelvic Support Problems Pelvic Support Problems Explained Ligaments, muscles, and connective tissue normally hold your bladder, uterus, and other organs in their proper places in your pelvis. When these tissues become weak, a problem with pelvic support may result. Weak support can cause one or more of the pelvic organs to drop down into the vagina. An organ may even drop so far that is partially exposed outside the body.  Pelvic support problems are named by the change in the organ. The main types of pelvic support problems are:  Cystocele: When the bladder drops down into your vagina.  Enterocele: When your small intestine drops between your vagina and rectum.  Rectocele: When your rectum bulges into the vaginal wall.  Uterine prolapse: When your uterus drops into your vagina.  Vaginal prolapse: When the top part of the vagina begins to droop. This sometimes happens after a hysterectomy (removal of the uterus).  Causes Pelvic support problems can be caused by many conditions. They may begin after you give birth, especially if you had a large baby. During childbirth, the muscles and skin of the birth canal (vagina) are stretched and sometimes torn. They heal over time but are not always exactly the same. A long pushing stage of labor may also weaken these tissues as well as very rapid births as the tissues do not have time to stretch so they tear.  Also, after menopause, there are changes in the vaginal walls resulting from a decrease in estrogen. Estrogen helps to keep the tissues toned. Low levels of estrogen weaken the vaginal walls and may cause the bladder to shift from its normal position. As women get older, the loss of muscle tone and the relaxation of muscles may cause the uterus or other organs to drop.  Over time, conditions like chronic coughing, chronic constipation, doing a lot of heavy lifting, straining to pass stool, and obesity, can also weaken the pelvic support muscles.   Diagnosing Pelvic Support Problems Your health care provider will ask about your symptoms and do a pelvic examination. Your provider may also do a rectal exam during your pelvic exam. Your provider may ask you to: 1. Bear down and push (like you are having a bowel movement) so he or she can see if your bladder or other part of your body protrudes into the vagina. 2. Contract the muscles of your pelvis to check the strength of your pelvic muscles.  3. Do several types of urine, nerve and muscle tests of the pelvis and around the bladder to see what type of treatment is best for you.   Symptoms Symptoms of pelvic support problems depend on the organ involved, but may include:  urine leakage  stain or fecal loss after a bowel movement trouble having bowel movements  ache in the lower abdomen, groin, or lower back  bladder infection  a feeling of heaviness, pulling, or fullness in the pelvis, or a feeling that something is falling out of the vagina  an organ protruding from your vaginal opening  feeling the need to support the organs or perineal area to empty bladder or bowels painful sexual intercourse.  Many women feel pelvic pressure or trouble holding their urine immediately after childbirth. For some, these symptoms go away permanently, in others they return as they get older.  Treatment Options A prolapsed organ cannot repair itself. Contact your health care provider as soon as you notice symptoms of a problem. Treatment depends on what the specific problem is and how far  advanced it is.  The symptoms caused by some pelvic support problems may simply be treated with changes in diet, medicine to soften the stool, weight loss, or avoiding strenuous activities. You may also do pelvic floor exercises to help strengthen your pelvic muscles.  Some cases of prolapse may require a special support device made from plastic or rubber called a pessary that fits into the vagina to support the uterus,  vagina, or bladder. A pessary can also help women who leak urine when coughing, straining, or exercising. In mild cases, a tampon or vaginal diaphragm may be used instead of a pessary.  Talk to your doctor or health care provider about these options. In serious cases, surgery may be needed to put the organs back into their proper place. The uterus may be removed because of the pressure it puts on the bladder.  Your doctor will know what surgery will be best for you. How can I prevent pelvic support problems?  You can help prevent pelvic support problems by:  maintaining a healthy lifestyle  continuing to do pelvic floor exercises after you deliver a baby  maintaining a healthy weight  avoiding a lot of heavy lifting and lifting with your legs (not from your waist)  treating constipation and avoid getting   About Cystocele Overview The pelvic organs, including the bladder, are normally supported by pelvic floor muscles and ligaments.   When these muscles and ligaments are stretched, weakened or torn, the wall between the bladder and the vagina sags or herniates causing a prolapse, sometimes called a cystocele. This condition may cause discomfort and problems with emptying the bladder.  It can be present in various stages.  Some people are not aware of the changes. Others may notice changes at the vaginal opening or a feeling of the bladder dropping outside the body. Causes of a Cystocele A cystocele is usually caused by muscle straining or stretching during childbirth.  In addition, cystocele is more common after menopause, because the hormone estrogen helps keep the elastic tissues around the pelvic organs strong.  A cystocele is more likely to occur when levels of estrogen decrease. Other causes include: heavy lifting, chronic coughing, previous pelvic surgery and obesity.  Symptoms A bladder that has dropped from its normal position may cause: unwanted urine leakage (stress incontinence),  frequent urination or urge to urinate, incomplete emptying of the bladder (not feeling bladder relief after emptying), pain or discomfort in the vagina, pelvis, groin, lower back or lower abdomen and frequent urinary tract infections.  Mild cases may not cause any symptoms.  Treatment Options Pelvic floor (Kegel) exercises: Strength training the muscles in your genital area  Behavioral changes: Treating and preventing constipation, taking time to empty your bladder properly, learning to lift properly and/or  avoid heavy lifting when possible, stopping smoking, avoiding weight gain and treating a chronic cough or bronchitis. A pessary: A vaginal support device is sometimes used to help pelvic support caused by muscle and ligament changes. Surgery: Aurgical repair may be necessary if symptoms cannot be managed with exercise, behavioral changes and a pessary. Surgery is usually considered for severe cases.    Suzanne Shams, MD Address: [address], [place], [place] [address] Phone: [phone]

## 2021-06-24 DIAGNOSIS — Z96641 Presence of right artificial hip joint: Secondary | ICD-10-CM | POA: Diagnosis not present

## 2021-06-24 DIAGNOSIS — Z4789 Encounter for other orthopedic aftercare: Secondary | ICD-10-CM | POA: Diagnosis not present

## 2021-06-24 DIAGNOSIS — R262 Difficulty in walking, not elsewhere classified: Secondary | ICD-10-CM | POA: Diagnosis not present

## 2021-06-24 DIAGNOSIS — R293 Abnormal posture: Secondary | ICD-10-CM | POA: Diagnosis not present

## 2021-06-24 DIAGNOSIS — R531 Weakness: Secondary | ICD-10-CM | POA: Diagnosis not present

## 2021-06-24 DIAGNOSIS — M25651 Stiffness of right hip, not elsewhere classified: Secondary | ICD-10-CM | POA: Diagnosis not present

## 2021-06-24 DIAGNOSIS — M25652 Stiffness of left hip, not elsewhere classified: Secondary | ICD-10-CM | POA: Diagnosis not present

## 2021-07-04 ENCOUNTER — Encounter: Payer: Self-pay | Admitting: Orthopaedic Surgery

## 2021-07-04 DIAGNOSIS — F419 Anxiety disorder, unspecified: Secondary | ICD-10-CM | POA: Diagnosis not present

## 2021-07-04 DIAGNOSIS — F331 Major depressive disorder, recurrent, moderate: Secondary | ICD-10-CM | POA: Diagnosis not present

## 2021-07-11 ENCOUNTER — Encounter: Payer: Medicare Other | Admitting: Physical Therapy

## 2021-07-11 ENCOUNTER — Encounter: Payer: Self-pay | Admitting: Physical Therapy

## 2021-07-11 ENCOUNTER — Ambulatory Visit: Payer: Medicare Other | Attending: Family Medicine | Admitting: Physical Therapy

## 2021-07-11 DIAGNOSIS — M6281 Muscle weakness (generalized): Secondary | ICD-10-CM | POA: Insufficient documentation

## 2021-07-11 DIAGNOSIS — R278 Other lack of coordination: Secondary | ICD-10-CM | POA: Insufficient documentation

## 2021-07-11 NOTE — Therapy (Signed)
OUTPATIENT PHYSICAL THERAPY TREATMENT NOTE   Patient Name: [name] MRN: [id number] DOB:[birth year], 76 y.o., female 66 Date: 07/11/2021  PCP: Marda Stalker, PA-C REFERRING PROVIDER: Marda Stalker, PA-C  END OF SESSION:   PT End of Session - 07/11/21 1447     Visit Number 2    Date for PT Re-Evaluation 09/12/21    Authorization Type Medicare    Authorization - Visit Number 2    Authorization - Number of Visits 10    PT Start Time 6384    PT Stop Time 1525    PT Time Calculation (min) 40 min    Activity Tolerance Patient tolerated treatment well    Behavior During Therapy [facility] for tasks assessed/performed             Past Medical History:  Diagnosis Date   Anemia    Congenital musculoskeletal deformity of spine    Depression    Headache    Hypertension    Incontinence of bowel 09/2013   Lumbago    Lumbosacral spondylosis without myelopathy    Sleep apnea    Sleep disorder    uses gabapentin at bedtime    Spinal stenosis, lumbar region, without neurogenic claudication    Past Surgical History:  Procedure Laterality Date   ANKLE SURGERY Right 2015   lengthened the achilles    ANTERIOR HIP REVISION Right 10/07/2020   Procedure: RIGHT HIP REVISION OF  HIP BALL/POLY-LINER;  Surgeon: Mcarthur Rossetti, MD;  Location: [place];  Service: Orthopedics;  Laterality: Right;  RNFA PLEASE   AUGMENTATION MAMMAPLASTY Bilateral 2000 or 2001 unsure    Left Implant has busted and is no longer visible   BACK SURGERY  2015   fusion , Dr Patrice Paradise at [facility]; has 2 metal rods in place , fusion from t2 to s1    BACK SURGERY  01/2017   Dr Rennis Harding ; repair of cracked titanium rod in back lumbar    BREAST EXCISIONAL BIOPSY Left    30 yrs ago   bunionectomy  Bilateral 1980s   multiple    COLONOSCOPY     with polypectomy ; q 5 years    FOOT SURGERY  07/2011   SHOULDER SURGERY Left 2013   rotator cuff    TOTAL HIP ARTHROPLASTY Right 05/08/2017    Procedure: RIGHT TOTAL HIP ARTHROPLASTY ANTERIOR APPROACH;  Surgeon: Paralee Cancel, MD;  Location: [facility];  Service: Orthopedics;  Laterality: Right;  70 mins   Patient Active Problem List   Diagnosis Date Noted   Leg length discrepancy, Right 10/07/2020   History of revision of total replacement of right hip joint 10/07/2020   Multinodular goiter 05/18/2020   Hip contracture, unspecified laterality 07/02/2018   Overweight (BMI 25.0-29.9) 05/09/2017   S/P right THA, AA 05/08/2017   Chronic bilateral low back pain without sciatica 04/05/2015   Disorder of sacroiliac joint 12/22/2014   Chronic low back pain 12/22/2014   Acquired scoliosis 11/27/2011   Lumbosacral spondylosis without myelopathy 05/22/2011    REFERRING DIAG: N39.3 Stress Incontinence of urine  THERAPY DIAG:  Muscle weakness (generalized)  Other lack of coordination  Rationale for Evaluation and Treatment Rehabilitation  PERTINENT HISTORY: ANTERIOR HIP REVISION on right on 10/07/2020; Back surgery x2; TIA; fused from T9-S1 with a Harrington rod  PRECAUTIONS: None  SUBJECTIVE: I have reduced my alcohol to 1 glass and now I do not have the urgency and I sleep through the night. I would like today be  my last visit.   PATIENT GOALS hold her urine when she feel the urge to go  OBJECTIVE: (objective measures completed at initial evaluation unless otherwise dated)  PATIENT SURVEYS:  UIQ-7 62   COGNITION:            Overall cognitive status: Within functional limits for tasks assessed                              GAIT: Assistive device utilized: single point cane Level of assistance: Complete Independence Comments: Patient walks with a single point cane with left hip going laterally   POSTURE:  Patient stands with flexed trunk, scoliosis, right hip going to the right       PELVIC MMT:   MMT eval  Vaginal 1/5 in supine and sitting, gapping still of the vaginal canal  (Blank rows = not tested)          PALPATION:   General  tenderness located in the left lower quadrant and right side of the umbilicus, tightness in the right hip adductor                 External Perineal Exam intact                             Internal Pelvic Floor not able to hug the therapist finger   TONE: low   PROLAPSE: Anterior wall prolapse just above the introitus   TODAY'S TREATMENT  Exercises: Stretches/mobility: Strengthening: seated pelvic floor contraction with pelvic floor contraction Standing rows with pelvic floor contraction Standing left hip abduction with pelvic floor contraction Marching supine with pelvic floor contraction Therapeutic activities: Functional strengthening activities: Self-care: Educated patient on how to incorporate her pelvic floor with her exercises she is doing at her other therapy.  Educated patient on bladder irritants and how they affect the bladder. Educated patient on the urge to void to delay her from urinating.     EVAL Date:  HEP established-see below     PATIENT EDUCATION: 07/11/2021 Education details: Access Code: [employer] Person educated: Patient Education method: Explanation, Demonstration, and Handouts Education comprehension: verbalized understanding and returned demonstration    PATIENT EDUCATION:  Education details: information on prolapse and cystocele Person educated: Patient Education method: Explanation, Demonstration, Tactile cues, Verbal cues, and Handouts Education comprehension: verbalized understanding, returned demonstration, verbal cues required, tactile cues required, and needs further education     HOME EXERCISE PROGRAM: 07/11/2021 Access Code: [facility] URL: [email or web address] Date: 07/11/2021 Prepared by: Earlie Counts  Program Notes Rules for exercise1. do not hold your breath2. do not push your belly out3. Talk to physical therapist so they can incorporate the pelvic floor with your exercise  Exercises - Seated  Pelvic Floor Contraction with Isometric Hip Adduction  - 2 x daily - 7 x weekly - 1 sets - 10 reps - 5 sec hold   ASSESSMENT:   CLINICAL IMPRESSION: Patient is a 76 y.o. female  who was seen today for physical therapy treatment for stress incontinence. Patient was educated on urge to void and bladder irritants. She was able to educated patient on how to incorporate her pelvic floor contraction with her exercises in her other physical therapy in another office. Patient reports she has reduced her alcohol  and now is not have the urge to void and is able to sleep through the night. She reports her urinary leakage is  better now. Patient want to be discharged due to having physical therapy in another place.       OBJECTIVE IMPAIRMENTS decreased activity tolerance, decreased coordination, decreased endurance, and decreased strength.    ACTIVITY LIMITATIONS lifting, standing, squatting, continence, and locomotion level   PARTICIPATION LIMITATIONS: shopping and community activity   PERSONAL FACTORS ANTERIOR HIP REVISION on right on 10/07/2020; Back surgery x2; TIA; fused from T9-S1 with a Harrington rod are also affecting patient's functional outcome.    REHAB POTENTIAL: Good   CLINICAL DECISION MAKING: Evolving/moderate complexity   EVALUATION COMPLEXITY: Moderate     GOALS: Goals reviewed with patient? Yes   SHORT TERM GOALS: Target date: 07/18/2021   Patient educated on prolapse management for cystocele.  Baseline: Goal status: not met 07/11/2021 2.  Patient educated on bladder irritants and how they affect the bladder.  Baseline:  Goal status: met 07/11/2021  3.  Patient is able to contract the pelvic floor to 2/5 and hug the therapist finger minimally.  Baseline:  Goal status: not assessed due to patient not wanting the strength checked 07/11/2021   LONG TERM GOALS: Target date: 09/12/2021    Patient independent with advanced HEP for pelvic floor strengthening.  Baseline:  Goal status:  Met 07/11/2021  2.  Pelvic floor strength is >/= 3/5 with minimal hug of therapist finger holding greater than 5 seconds.  Baseline:  Goal status: Patient deferred assessment 07/11/2021  3.  Patient understands how to manage the urge to void with behavioral techniques.  Baseline:  Goal status: met 07/11/2021  4.  Patient reports her urinary leakage going from sit to stand decreased >/= 50% due increased in pelvic floor strength and pressure management.  Baseline:  Goal status: Not met due to only wanting 1 treatment session.  07/11/2021 5.  Patient understands pressure management with different positions to reduce her urinary leakage and pressure on her anterior wall prolapse.  Baseline:  Goal status: Not met due to patient just wanting one session. 07/11/2021      PLAN: PT FREQUENCY: 1x/week   PT DURATION: 12 weeks   PLANNED INTERVENTIONS: Therapeutic exercises, Therapeutic activity, Neuromuscular re-education, Patient/Family education, Joint mobilization, Biofeedback, and Manual therapy   PLAN FOR NEXT SESSION: Discharge to HEP   Earlie Counts, PT 07/11/21 3:29 PM    PHYSICAL THERAPY DISCHARGE SUMMARY  Visits from Start of Care: 2  Current functional level related to goals / functional outcomes: See above. Patient only wanted one treatment due to having therapy in another facility.    Remaining deficits: See above.    Education / Equipment: HEP   Patient agrees to discharge. Patient goals were partially met. Patient is being discharged due to the patient's request. Thank you for the referral. Earlie Counts, PT 07/11/21 3:30 PM

## 2021-07-11 NOTE — Patient Instructions (Signed)
Bladder Irritants  Certain foods and beverages can be irritating to the bladder.  Avoiding these irritants may decrease your symptoms of urinary urgency, frequency or bladder pain.  Even reducing your intake can help with your symptoms.  Not everyone is sensitive to all bladder irritants, so you may consider focusing on one irritant at a time, removing or reducing your intake of that irritant for 7-10 days to see if this change helps your symptoms.  Water intake is also very important.  Below is a list of bladder irritants.  Drinks: alcohol, carbonated beverages, caffeinated beverages such as coffee and tea, drinks with artificial sweeteners, citrus juices,  tomato juice  Foods: tomatoes and tomato based foods, spicy food, sugar and artificial sweeteners, vinegar, chocolate, raw onion, citrus fruits, pineapple  Other: acidic urine (too concentrated) - see water intake info below  Substitutes you can try that are NOT irritating to the bladder: cooked onion, pears, papayas, sun-brewed decaf teas, watermelons, non-citrus herbal teas, apricots, kava and low-acid instant drinks (Postum).    WATER INTAKE: Remember to drink lots of water (aim for fluid intake of half your body weight with 2/3 of fluids being water).  You may be limiting fluids due to fear of leakage, but this can actually worsen urgency symptoms due to highly concentrated urine.  Water helps balance the pH of your urine so it doesn't become too acidic - acidic urine is a bladder irritant!  Urge Incontinence  Ideal urination frequency is every 2-4 wakeful hours, which equates to 5-8 times within a 24-hour period.   Urge incontinence is leakage that occurs when the bladder muscle contracts, creating a sudden need to go before getting to the bathroom.   Going too often when your bladder isn't actually full can disrupt the body's automatic signals to store and hold urine longer, which will increase urgency/frequency.  In this case, the  bladder "is running the show" and strategies can be learned to retrain this pattern.   One should be able to control the first urge to urinate, at around 143m.  The bladder can hold up to a "grande latte," or 4056m To help you gain control, practice the Urge Drill below when urgency strikes.  This drill will help retrain your bladder signals and allow you to store and hold urine longer.  The overall goal is to stretch out your time between voids to reach a more manageable voiding schedule.    Practice your "quick flicks" often throughout the day (each waking hour) even when you don't need feel the urge to go.  This will help strengthen your pelvic floor muscles, making them more effective in controlling leakage.  Urge Drill  When you feel an urge to go, follow these steps to regain control: Stop what you are doing and be still Take one deep breath, directing your air into your abdomen Think an affirming thought, such as "I've got this." Heel raises 5 times  Do 5 quick flicks of your pelvic floor Walk with control to the bathroom to void, or delay voiding  [facility] [id number] # [phone]

## 2021-07-20 ENCOUNTER — Ambulatory Visit (INDEPENDENT_AMBULATORY_CARE_PROVIDER_SITE_OTHER): Payer: Medicare Other

## 2021-07-20 ENCOUNTER — Ambulatory Visit: Payer: Self-pay

## 2021-07-20 ENCOUNTER — Ambulatory Visit (INDEPENDENT_AMBULATORY_CARE_PROVIDER_SITE_OTHER): Payer: Medicare Other | Admitting: Orthopaedic Surgery

## 2021-07-20 DIAGNOSIS — M25562 Pain in left knee: Secondary | ICD-10-CM

## 2021-07-20 DIAGNOSIS — M25561 Pain in right knee: Secondary | ICD-10-CM

## 2021-07-20 DIAGNOSIS — M7042 Prepatellar bursitis, left knee: Secondary | ICD-10-CM

## 2021-07-20 DIAGNOSIS — M1711 Unilateral primary osteoarthritis, right knee: Secondary | ICD-10-CM | POA: Diagnosis not present

## 2021-07-20 DIAGNOSIS — F331 Major depressive disorder, recurrent, moderate: Secondary | ICD-10-CM | POA: Diagnosis not present

## 2021-07-20 NOTE — Progress Notes (Signed)
The patient is well-known to [place].  She comes in today with bilateral knee pain.  The left knee hurts worse than the right and left knee hurts right over the patella.  The right knee hurts in the back of the knee.  She does ambulate the cane.  She has had a history of a hip replacement by one of my colleagues in town and I had to revise this in terms of a longer hip ball because of a significant leg length discrepancy.  She does have scoliosis as well.  The left knee pain is more acute and the right knee pain is more chronic.  Examination of her left knee shows her extensor mechanism is intact.  There is bruising around the patella itself and slight prepatellar bursitis but no redness.  There is no fluid collection truly to drain though.  Her range of motion is full and there is no medial or lateral joint line tenderness.  Her knee is ligamentously stable.  The right knee shows lateral joint line tenderness and pain in the back of the knee.  There is good range of motion of the knee but it is painful.  Of note I did let her in a spine position and her leg lengths are equal.  She has a tilt of her pelvis when she walks I think this is more related to her scoliosis.  X-rays of both knees were obtained and showed no acute findings.  There is no evidence of fracture.  The right knee does have tricompartment arthritis involving mainly the lateral compartment and the patellofemoral joint.  This point follow-up is as needed.  If she gets to the point where the knee is bothering her enough to consider knee replacement surgery for the right knee she will consider that.  For the left knee I recommended ice and Voltaren gel.

## 2021-07-21 DIAGNOSIS — M25651 Stiffness of right hip, not elsewhere classified: Secondary | ICD-10-CM | POA: Diagnosis not present

## 2021-07-21 DIAGNOSIS — R531 Weakness: Secondary | ICD-10-CM | POA: Diagnosis not present

## 2021-07-21 DIAGNOSIS — Z96641 Presence of right artificial hip joint: Secondary | ICD-10-CM | POA: Diagnosis not present

## 2021-07-21 DIAGNOSIS — M25652 Stiffness of left hip, not elsewhere classified: Secondary | ICD-10-CM | POA: Diagnosis not present

## 2021-07-21 DIAGNOSIS — R293 Abnormal posture: Secondary | ICD-10-CM | POA: Diagnosis not present

## 2021-07-21 DIAGNOSIS — Z4789 Encounter for other orthopedic aftercare: Secondary | ICD-10-CM | POA: Diagnosis not present

## 2021-07-21 DIAGNOSIS — R262 Difficulty in walking, not elsewhere classified: Secondary | ICD-10-CM | POA: Diagnosis not present

## 2021-07-25 ENCOUNTER — Encounter: Payer: Self-pay | Admitting: Physical Therapy

## 2021-07-25 ENCOUNTER — Encounter: Payer: Medicare Other | Admitting: Physical Therapy

## 2021-07-27 DIAGNOSIS — F331 Major depressive disorder, recurrent, moderate: Secondary | ICD-10-CM | POA: Diagnosis not present

## 2021-07-27 DIAGNOSIS — F419 Anxiety disorder, unspecified: Secondary | ICD-10-CM | POA: Diagnosis not present

## 2021-07-29 DIAGNOSIS — Z96641 Presence of right artificial hip joint: Secondary | ICD-10-CM | POA: Diagnosis not present

## 2021-07-29 DIAGNOSIS — Z4789 Encounter for other orthopedic aftercare: Secondary | ICD-10-CM | POA: Diagnosis not present

## 2021-07-29 DIAGNOSIS — R531 Weakness: Secondary | ICD-10-CM | POA: Diagnosis not present

## 2021-07-29 DIAGNOSIS — R293 Abnormal posture: Secondary | ICD-10-CM | POA: Diagnosis not present

## 2021-07-29 DIAGNOSIS — M25652 Stiffness of left hip, not elsewhere classified: Secondary | ICD-10-CM | POA: Diagnosis not present

## 2021-07-29 DIAGNOSIS — M25651 Stiffness of right hip, not elsewhere classified: Secondary | ICD-10-CM | POA: Diagnosis not present

## 2021-07-29 DIAGNOSIS — R262 Difficulty in walking, not elsewhere classified: Secondary | ICD-10-CM | POA: Diagnosis not present

## 2021-08-05 DIAGNOSIS — R531 Weakness: Secondary | ICD-10-CM | POA: Diagnosis not present

## 2021-08-05 DIAGNOSIS — R293 Abnormal posture: Secondary | ICD-10-CM | POA: Diagnosis not present

## 2021-08-05 DIAGNOSIS — Z4789 Encounter for other orthopedic aftercare: Secondary | ICD-10-CM | POA: Diagnosis not present

## 2021-08-05 DIAGNOSIS — Z96641 Presence of right artificial hip joint: Secondary | ICD-10-CM | POA: Diagnosis not present

## 2021-08-05 DIAGNOSIS — M25651 Stiffness of right hip, not elsewhere classified: Secondary | ICD-10-CM | POA: Diagnosis not present

## 2021-08-05 DIAGNOSIS — M25652 Stiffness of left hip, not elsewhere classified: Secondary | ICD-10-CM | POA: Diagnosis not present

## 2021-08-05 DIAGNOSIS — R262 Difficulty in walking, not elsewhere classified: Secondary | ICD-10-CM | POA: Diagnosis not present

## 2021-08-10 ENCOUNTER — Encounter: Payer: Self-pay | Admitting: Physical Therapy

## 2021-08-10 ENCOUNTER — Encounter: Payer: Medicare Other | Admitting: Physical Therapy

## 2021-08-12 DIAGNOSIS — R293 Abnormal posture: Secondary | ICD-10-CM | POA: Diagnosis not present

## 2021-08-12 DIAGNOSIS — R531 Weakness: Secondary | ICD-10-CM | POA: Diagnosis not present

## 2021-08-12 DIAGNOSIS — Z96641 Presence of right artificial hip joint: Secondary | ICD-10-CM | POA: Diagnosis not present

## 2021-08-12 DIAGNOSIS — Z4789 Encounter for other orthopedic aftercare: Secondary | ICD-10-CM | POA: Diagnosis not present

## 2021-08-12 DIAGNOSIS — R262 Difficulty in walking, not elsewhere classified: Secondary | ICD-10-CM | POA: Diagnosis not present

## 2021-08-12 DIAGNOSIS — M25651 Stiffness of right hip, not elsewhere classified: Secondary | ICD-10-CM | POA: Diagnosis not present

## 2021-08-12 DIAGNOSIS — M25652 Stiffness of left hip, not elsewhere classified: Secondary | ICD-10-CM | POA: Diagnosis not present

## 2021-08-19 DIAGNOSIS — R262 Difficulty in walking, not elsewhere classified: Secondary | ICD-10-CM | POA: Diagnosis not present

## 2021-08-19 DIAGNOSIS — Z4789 Encounter for other orthopedic aftercare: Secondary | ICD-10-CM | POA: Diagnosis not present

## 2021-08-19 DIAGNOSIS — M25652 Stiffness of left hip, not elsewhere classified: Secondary | ICD-10-CM | POA: Diagnosis not present

## 2021-08-19 DIAGNOSIS — M25651 Stiffness of right hip, not elsewhere classified: Secondary | ICD-10-CM | POA: Diagnosis not present

## 2021-08-19 DIAGNOSIS — R293 Abnormal posture: Secondary | ICD-10-CM | POA: Diagnosis not present

## 2021-08-19 DIAGNOSIS — R531 Weakness: Secondary | ICD-10-CM | POA: Diagnosis not present

## 2021-08-19 DIAGNOSIS — Z96641 Presence of right artificial hip joint: Secondary | ICD-10-CM | POA: Diagnosis not present

## 2021-08-22 ENCOUNTER — Encounter: Payer: Self-pay | Admitting: Physical Therapy

## 2021-08-22 ENCOUNTER — Encounter: Payer: Medicare Other | Admitting: Physical Therapy

## 2021-08-26 DIAGNOSIS — Z4789 Encounter for other orthopedic aftercare: Secondary | ICD-10-CM | POA: Diagnosis not present

## 2021-08-26 DIAGNOSIS — M25652 Stiffness of left hip, not elsewhere classified: Secondary | ICD-10-CM | POA: Diagnosis not present

## 2021-08-26 DIAGNOSIS — Z96641 Presence of right artificial hip joint: Secondary | ICD-10-CM | POA: Diagnosis not present

## 2021-08-26 DIAGNOSIS — R293 Abnormal posture: Secondary | ICD-10-CM | POA: Diagnosis not present

## 2021-08-26 DIAGNOSIS — R262 Difficulty in walking, not elsewhere classified: Secondary | ICD-10-CM | POA: Diagnosis not present

## 2021-08-26 DIAGNOSIS — R531 Weakness: Secondary | ICD-10-CM | POA: Diagnosis not present

## 2021-08-26 DIAGNOSIS — M25651 Stiffness of right hip, not elsewhere classified: Secondary | ICD-10-CM | POA: Diagnosis not present

## 2021-08-31 DIAGNOSIS — F419 Anxiety disorder, unspecified: Secondary | ICD-10-CM | POA: Diagnosis not present

## 2021-08-31 DIAGNOSIS — F331 Major depressive disorder, recurrent, moderate: Secondary | ICD-10-CM | POA: Diagnosis not present

## 2021-09-02 DIAGNOSIS — M25651 Stiffness of right hip, not elsewhere classified: Secondary | ICD-10-CM | POA: Diagnosis not present

## 2021-09-02 DIAGNOSIS — Z4789 Encounter for other orthopedic aftercare: Secondary | ICD-10-CM | POA: Diagnosis not present

## 2021-09-02 DIAGNOSIS — R293 Abnormal posture: Secondary | ICD-10-CM | POA: Diagnosis not present

## 2021-09-02 DIAGNOSIS — R262 Difficulty in walking, not elsewhere classified: Secondary | ICD-10-CM | POA: Diagnosis not present

## 2021-09-02 DIAGNOSIS — M25652 Stiffness of left hip, not elsewhere classified: Secondary | ICD-10-CM | POA: Diagnosis not present

## 2021-09-02 DIAGNOSIS — R531 Weakness: Secondary | ICD-10-CM | POA: Diagnosis not present

## 2021-09-02 DIAGNOSIS — Z96641 Presence of right artificial hip joint: Secondary | ICD-10-CM | POA: Diagnosis not present

## 2021-09-05 ENCOUNTER — Encounter: Payer: Self-pay | Admitting: Physical Therapy

## 2021-09-05 ENCOUNTER — Encounter: Payer: Medicare Other | Admitting: Physical Therapy

## 2021-09-09 DIAGNOSIS — Z4789 Encounter for other orthopedic aftercare: Secondary | ICD-10-CM | POA: Diagnosis not present

## 2021-09-09 DIAGNOSIS — Z96641 Presence of right artificial hip joint: Secondary | ICD-10-CM | POA: Diagnosis not present

## 2021-09-09 DIAGNOSIS — R531 Weakness: Secondary | ICD-10-CM | POA: Diagnosis not present

## 2021-09-09 DIAGNOSIS — R262 Difficulty in walking, not elsewhere classified: Secondary | ICD-10-CM | POA: Diagnosis not present

## 2021-09-09 DIAGNOSIS — R293 Abnormal posture: Secondary | ICD-10-CM | POA: Diagnosis not present

## 2021-09-09 DIAGNOSIS — M25651 Stiffness of right hip, not elsewhere classified: Secondary | ICD-10-CM | POA: Diagnosis not present

## 2021-09-09 DIAGNOSIS — M25652 Stiffness of left hip, not elsewhere classified: Secondary | ICD-10-CM | POA: Diagnosis not present

## 2021-09-16 DIAGNOSIS — R262 Difficulty in walking, not elsewhere classified: Secondary | ICD-10-CM | POA: Diagnosis not present

## 2021-09-16 DIAGNOSIS — M25651 Stiffness of right hip, not elsewhere classified: Secondary | ICD-10-CM | POA: Diagnosis not present

## 2021-09-16 DIAGNOSIS — R293 Abnormal posture: Secondary | ICD-10-CM | POA: Diagnosis not present

## 2021-09-16 DIAGNOSIS — Z4789 Encounter for other orthopedic aftercare: Secondary | ICD-10-CM | POA: Diagnosis not present

## 2021-09-16 DIAGNOSIS — R531 Weakness: Secondary | ICD-10-CM | POA: Diagnosis not present

## 2021-09-16 DIAGNOSIS — Z96641 Presence of right artificial hip joint: Secondary | ICD-10-CM | POA: Diagnosis not present

## 2021-09-16 DIAGNOSIS — M25652 Stiffness of left hip, not elsewhere classified: Secondary | ICD-10-CM | POA: Diagnosis not present

## 2021-09-23 ENCOUNTER — Encounter: Payer: Medicare Other | Admitting: Physical Therapy

## 2021-09-23 DIAGNOSIS — R531 Weakness: Secondary | ICD-10-CM | POA: Diagnosis not present

## 2021-09-23 DIAGNOSIS — M25652 Stiffness of left hip, not elsewhere classified: Secondary | ICD-10-CM | POA: Diagnosis not present

## 2021-09-23 DIAGNOSIS — Z96641 Presence of right artificial hip joint: Secondary | ICD-10-CM | POA: Diagnosis not present

## 2021-09-23 DIAGNOSIS — Z4789 Encounter for other orthopedic aftercare: Secondary | ICD-10-CM | POA: Diagnosis not present

## 2021-09-23 DIAGNOSIS — R262 Difficulty in walking, not elsewhere classified: Secondary | ICD-10-CM | POA: Diagnosis not present

## 2021-09-23 DIAGNOSIS — M25651 Stiffness of right hip, not elsewhere classified: Secondary | ICD-10-CM | POA: Diagnosis not present

## 2021-09-23 DIAGNOSIS — R293 Abnormal posture: Secondary | ICD-10-CM | POA: Diagnosis not present

## 2021-09-26 ENCOUNTER — Telehealth: Payer: Self-pay | Admitting: *Deleted

## 2021-09-26 ENCOUNTER — Encounter: Payer: Self-pay | Admitting: *Deleted

## 2021-09-26 NOTE — Patient Instructions (Signed)
Visit Information  Thank you for taking time to visit with me today. Please don't hesitate to contact me if I can be of assistance to you.   Following are the goals we discussed today:   Goals Addressed               This Visit's Progress     No needs (pt-stated)        Care Coordination Interventions: Advised patient to contact her provider to schedule her AWV for 2023 it this has not taken place (receptive) Provided education to patient and/or caregiver about advanced directives Reviewed medications with patient and discussed adherence and offered education as needed Reviewed scheduled/upcoming provider appointments including pending appointments Screening for signs and symptoms of depression related to chronic disease state  Assessed social determinant of health barriers         Please call the care guide team at [phone] if you need to cancel or reschedule your appointment.   If you are experiencing a Mental Health or [place] or need someone to talk to, please call the Suicide and Crisis Lifeline: 988  Patient verbalizes understanding of instructions and care plan provided today and agrees to view in [place]. Active MyChart status and patient understanding of how to access instructions and care plan via MyChart confirmed with patient.     No further follow up required: No needs  Raina Mina, RN Care Management Coordinator [place] Office [phone]

## 2021-09-26 NOTE — Patient Outreach (Signed)
  Care Coordination   Initial Visit Note   09/26/2021 Name: ANALYS RYDEN MRN: [id number] DOB: 1945/09/08  [name] is a 76 y.o. year old female who sees Pcp, No for primary care. I spoke with  [name] by phone today.  What matters to the patients health and wellness today?  No needs    Goals Addressed               This Visit's Progress     No needs (pt-stated)        Care Coordination Interventions: Advised patient to contact her provider to schedule her AWV for 2023 it this has not taken place (receptive) Provided education to patient and/or caregiver about advanced directives Reviewed medications with patient and discussed adherence and offered education as needed Reviewed scheduled/upcoming provider appointments including pending appointments Screening for signs and symptoms of depression related to chronic disease state  Assessed social determinant of health barriers         SDOH assessments and interventions completed:  Yes  SDOH Interventions Today    Flowsheet Row Most Recent Value  SDOH Interventions   Food Insecurity Interventions Intervention Not Indicated  Housing Interventions Intervention Not Indicated  Transportation Interventions Intervention Not Indicated        Care Coordination Interventions Activated:  Yes  Care Coordination Interventions:  Yes, provided   Follow up plan: No further intervention required.   Encounter Outcome:  Pt. Visit Completed   Raina Mina, RN Care Management Coordinator [place] Office [phone]

## 2021-09-30 DIAGNOSIS — R0789 Other chest pain: Secondary | ICD-10-CM | POA: Diagnosis not present

## 2021-10-05 DIAGNOSIS — H52223 Regular astigmatism, bilateral: Secondary | ICD-10-CM | POA: Diagnosis not present

## 2021-10-05 DIAGNOSIS — H35033 Hypertensive retinopathy, bilateral: Secondary | ICD-10-CM | POA: Diagnosis not present

## 2021-10-05 DIAGNOSIS — D3132 Benign neoplasm of left choroid: Secondary | ICD-10-CM | POA: Diagnosis not present

## 2021-10-05 DIAGNOSIS — H524 Presbyopia: Secondary | ICD-10-CM | POA: Diagnosis not present

## 2021-10-05 DIAGNOSIS — H5203 Hypermetropia, bilateral: Secondary | ICD-10-CM | POA: Diagnosis not present

## 2021-10-05 DIAGNOSIS — H2513 Age-related nuclear cataract, bilateral: Secondary | ICD-10-CM | POA: Diagnosis not present

## 2021-10-05 DIAGNOSIS — I10 Essential (primary) hypertension: Secondary | ICD-10-CM | POA: Diagnosis not present

## 2021-10-06 DIAGNOSIS — Z6832 Body mass index (BMI) 32.0-32.9, adult: Secondary | ICD-10-CM | POA: Diagnosis not present

## 2021-10-06 DIAGNOSIS — R109 Unspecified abdominal pain: Secondary | ICD-10-CM | POA: Diagnosis not present

## 2021-10-07 DIAGNOSIS — M25651 Stiffness of right hip, not elsewhere classified: Secondary | ICD-10-CM | POA: Diagnosis not present

## 2021-10-07 DIAGNOSIS — Z96641 Presence of right artificial hip joint: Secondary | ICD-10-CM | POA: Diagnosis not present

## 2021-10-07 DIAGNOSIS — Z4789 Encounter for other orthopedic aftercare: Secondary | ICD-10-CM | POA: Diagnosis not present

## 2021-10-07 DIAGNOSIS — R293 Abnormal posture: Secondary | ICD-10-CM | POA: Diagnosis not present

## 2021-10-07 DIAGNOSIS — M25652 Stiffness of left hip, not elsewhere classified: Secondary | ICD-10-CM | POA: Diagnosis not present

## 2021-10-07 DIAGNOSIS — R531 Weakness: Secondary | ICD-10-CM | POA: Diagnosis not present

## 2021-10-07 DIAGNOSIS — R262 Difficulty in walking, not elsewhere classified: Secondary | ICD-10-CM | POA: Diagnosis not present

## 2021-10-12 DIAGNOSIS — F331 Major depressive disorder, recurrent, moderate: Secondary | ICD-10-CM | POA: Diagnosis not present

## 2021-10-12 DIAGNOSIS — F419 Anxiety disorder, unspecified: Secondary | ICD-10-CM | POA: Diagnosis not present

## 2021-10-14 ENCOUNTER — Other Ambulatory Visit: Payer: Self-pay | Admitting: Family Medicine

## 2021-10-14 DIAGNOSIS — Z1231 Encounter for screening mammogram for malignant neoplasm of breast: Secondary | ICD-10-CM

## 2021-10-21 DIAGNOSIS — R262 Difficulty in walking, not elsewhere classified: Secondary | ICD-10-CM | POA: Diagnosis not present

## 2021-10-21 DIAGNOSIS — Z96641 Presence of right artificial hip joint: Secondary | ICD-10-CM | POA: Diagnosis not present

## 2021-10-21 DIAGNOSIS — R293 Abnormal posture: Secondary | ICD-10-CM | POA: Diagnosis not present

## 2021-10-21 DIAGNOSIS — M25651 Stiffness of right hip, not elsewhere classified: Secondary | ICD-10-CM | POA: Diagnosis not present

## 2021-10-21 DIAGNOSIS — R531 Weakness: Secondary | ICD-10-CM | POA: Diagnosis not present

## 2021-10-21 DIAGNOSIS — Z4789 Encounter for other orthopedic aftercare: Secondary | ICD-10-CM | POA: Diagnosis not present

## 2021-10-21 DIAGNOSIS — M25652 Stiffness of left hip, not elsewhere classified: Secondary | ICD-10-CM | POA: Diagnosis not present

## 2021-10-26 DIAGNOSIS — Z23 Encounter for immunization: Secondary | ICD-10-CM | POA: Diagnosis not present

## 2021-11-04 DIAGNOSIS — R262 Difficulty in walking, not elsewhere classified: Secondary | ICD-10-CM | POA: Diagnosis not present

## 2021-11-04 DIAGNOSIS — M25651 Stiffness of right hip, not elsewhere classified: Secondary | ICD-10-CM | POA: Diagnosis not present

## 2021-11-04 DIAGNOSIS — Z4789 Encounter for other orthopedic aftercare: Secondary | ICD-10-CM | POA: Diagnosis not present

## 2021-11-04 DIAGNOSIS — R531 Weakness: Secondary | ICD-10-CM | POA: Diagnosis not present

## 2021-11-04 DIAGNOSIS — M25652 Stiffness of left hip, not elsewhere classified: Secondary | ICD-10-CM | POA: Diagnosis not present

## 2021-11-04 DIAGNOSIS — R293 Abnormal posture: Secondary | ICD-10-CM | POA: Diagnosis not present

## 2021-11-04 DIAGNOSIS — Z96641 Presence of right artificial hip joint: Secondary | ICD-10-CM | POA: Diagnosis not present

## 2021-11-09 DIAGNOSIS — F419 Anxiety disorder, unspecified: Secondary | ICD-10-CM | POA: Diagnosis not present

## 2021-11-09 DIAGNOSIS — F331 Major depressive disorder, recurrent, moderate: Secondary | ICD-10-CM | POA: Diagnosis not present

## 2021-11-10 DIAGNOSIS — M47814 Spondylosis without myelopathy or radiculopathy, thoracic region: Secondary | ICD-10-CM | POA: Diagnosis not present

## 2021-11-16 ENCOUNTER — Telehealth: Payer: Medicare Other

## 2021-11-16 ENCOUNTER — Ambulatory Visit (INDEPENDENT_AMBULATORY_CARE_PROVIDER_SITE_OTHER): Payer: Medicare Other | Admitting: Orthopaedic Surgery

## 2021-11-16 ENCOUNTER — Encounter: Payer: Self-pay | Admitting: Orthopaedic Surgery

## 2021-11-16 DIAGNOSIS — G8929 Other chronic pain: Secondary | ICD-10-CM

## 2021-11-16 DIAGNOSIS — M25561 Pain in right knee: Secondary | ICD-10-CM | POA: Diagnosis not present

## 2021-11-16 DIAGNOSIS — M1711 Unilateral primary osteoarthritis, right knee: Secondary | ICD-10-CM

## 2021-11-16 MED ORDER — LIDOCAINE HCL 1 % IJ SOLN
3.0000 mL | INTRAMUSCULAR | Status: AC | PRN
Start: 2021-11-16 — End: 2021-11-16
  Administered 2021-11-16: 3 mL

## 2021-11-16 MED ORDER — METHYLPREDNISOLONE ACETATE 40 MG/ML IJ SUSP
40.0000 mg | INTRAMUSCULAR | Status: AC | PRN
  Administered 2021-11-16: 40 mg via INTRA_ARTICULAR

## 2021-11-16 NOTE — Telephone Encounter (Signed)
Right knee gel injection  

## 2021-11-16 NOTE — Progress Notes (Signed)
The patient is well-known to [place].  She has well-documented significant arthritis that is tricompartmental with her right knee.  It involves mainly the lateral compartment and patellofemoral joint.  She is requesting a steroid injection today.  Its been over 3 to 4 months or more since she has had a steroid injection in that knee.  She is traveling in early December.  Of note she has never had hyaluronic acid to treat the pain from arthritis in her right knee.  On exam she has mainly lateral joint line tenderness and slight valgus malalignment.  Her range of motion is full and there is a mild effusion.  I did place a steroid injection in her right knee per her request.  I do feel at this point it is worth considering hyaluronic acid to treat the pain from osteoarthritis of her right knee.  She agrees with this as well we will see if we can order this and gets approved for her right knee.      Procedure Note  Patient: [name]             Date of Birth: [birth year]           MRN: [id number]             Visit Date: 11/16/2021  Procedures: Visit Diagnoses:  1. Chronic pain of right knee   2. Unilateral primary osteoarthritis, right knee     Large Joint Inj: R knee on 11/16/2021 1:42 PM Indications: diagnostic evaluation and pain Details: 22 G 1.5 in needle, superolateral approach  Arthrogram: No  Medications: 3 mL lidocaine 1 %; 40 mg methylPREDNISolone acetate 40 MG/ML Outcome: tolerated well, no immediate complications Procedure, treatment alternatives, risks and benefits explained, specific risks discussed. Consent was given by the patient. Immediately prior to procedure a time out was called to verify the correct patient, procedure, equipment, support staff and site/side marked as required. Patient was prepped and draped in the usual sterile fashion.

## 2021-11-18 ENCOUNTER — Ambulatory Visit
Admission: RE | Admit: 2021-11-18 | Discharge: 2021-11-18 | Disposition: A | Discharge status: Home / Self Care | Payer: Medicare Other | Source: Ambulatory Visit | Attending: Family Medicine | Admitting: Family Medicine

## 2021-11-18 DIAGNOSIS — Z1231 Encounter for screening mammogram for malignant neoplasm of breast: Secondary | ICD-10-CM | POA: Diagnosis not present

## 2021-11-18 NOTE — Telephone Encounter (Signed)
VOB submitted for SynviscOne, right knee.  

## 2021-11-21 DIAGNOSIS — M25651 Stiffness of right hip, not elsewhere classified: Secondary | ICD-10-CM | POA: Diagnosis not present

## 2021-11-21 DIAGNOSIS — R293 Abnormal posture: Secondary | ICD-10-CM | POA: Diagnosis not present

## 2021-11-21 DIAGNOSIS — Z96641 Presence of right artificial hip joint: Secondary | ICD-10-CM | POA: Diagnosis not present

## 2021-11-21 DIAGNOSIS — R531 Weakness: Secondary | ICD-10-CM | POA: Diagnosis not present

## 2021-11-21 DIAGNOSIS — R262 Difficulty in walking, not elsewhere classified: Secondary | ICD-10-CM | POA: Diagnosis not present

## 2021-11-21 DIAGNOSIS — M25652 Stiffness of left hip, not elsewhere classified: Secondary | ICD-10-CM | POA: Diagnosis not present

## 2021-11-21 DIAGNOSIS — Z4789 Encounter for other orthopedic aftercare: Secondary | ICD-10-CM | POA: Diagnosis not present

## 2021-12-13 DIAGNOSIS — R293 Abnormal posture: Secondary | ICD-10-CM | POA: Diagnosis not present

## 2021-12-13 DIAGNOSIS — R531 Weakness: Secondary | ICD-10-CM | POA: Diagnosis not present

## 2021-12-13 DIAGNOSIS — M25651 Stiffness of right hip, not elsewhere classified: Secondary | ICD-10-CM | POA: Diagnosis not present

## 2021-12-13 DIAGNOSIS — Z96641 Presence of right artificial hip joint: Secondary | ICD-10-CM | POA: Diagnosis not present

## 2021-12-13 DIAGNOSIS — R262 Difficulty in walking, not elsewhere classified: Secondary | ICD-10-CM | POA: Diagnosis not present

## 2021-12-13 DIAGNOSIS — M25652 Stiffness of left hip, not elsewhere classified: Secondary | ICD-10-CM | POA: Diagnosis not present

## 2021-12-13 DIAGNOSIS — Z4789 Encounter for other orthopedic aftercare: Secondary | ICD-10-CM | POA: Diagnosis not present

## 2021-12-30 DIAGNOSIS — M25651 Stiffness of right hip, not elsewhere classified: Secondary | ICD-10-CM | POA: Diagnosis not present

## 2021-12-30 DIAGNOSIS — R262 Difficulty in walking, not elsewhere classified: Secondary | ICD-10-CM | POA: Diagnosis not present

## 2021-12-30 DIAGNOSIS — Z96641 Presence of right artificial hip joint: Secondary | ICD-10-CM | POA: Diagnosis not present

## 2021-12-30 DIAGNOSIS — Z4789 Encounter for other orthopedic aftercare: Secondary | ICD-10-CM | POA: Diagnosis not present

## 2021-12-30 DIAGNOSIS — R293 Abnormal posture: Secondary | ICD-10-CM | POA: Diagnosis not present

## 2021-12-30 DIAGNOSIS — R531 Weakness: Secondary | ICD-10-CM | POA: Diagnosis not present

## 2021-12-30 DIAGNOSIS — M25652 Stiffness of left hip, not elsewhere classified: Secondary | ICD-10-CM | POA: Diagnosis not present

## 2022-01-06 DIAGNOSIS — M25652 Stiffness of left hip, not elsewhere classified: Secondary | ICD-10-CM | POA: Diagnosis not present

## 2022-01-06 DIAGNOSIS — R293 Abnormal posture: Secondary | ICD-10-CM | POA: Diagnosis not present

## 2022-01-06 DIAGNOSIS — Z4789 Encounter for other orthopedic aftercare: Secondary | ICD-10-CM | POA: Diagnosis not present

## 2022-01-06 DIAGNOSIS — Z96641 Presence of right artificial hip joint: Secondary | ICD-10-CM | POA: Diagnosis not present

## 2022-01-06 DIAGNOSIS — M25651 Stiffness of right hip, not elsewhere classified: Secondary | ICD-10-CM | POA: Diagnosis not present

## 2022-01-06 DIAGNOSIS — R262 Difficulty in walking, not elsewhere classified: Secondary | ICD-10-CM | POA: Diagnosis not present

## 2022-01-06 DIAGNOSIS — R531 Weakness: Secondary | ICD-10-CM | POA: Diagnosis not present

## 2022-01-12 ENCOUNTER — Telehealth: Payer: Self-pay | Admitting: Orthopaedic Surgery

## 2022-01-17 DIAGNOSIS — F331 Major depressive disorder, recurrent, moderate: Secondary | ICD-10-CM | POA: Diagnosis not present

## 2022-01-24 DIAGNOSIS — F419 Anxiety disorder, unspecified: Secondary | ICD-10-CM | POA: Diagnosis not present

## 2022-01-24 DIAGNOSIS — F331 Major depressive disorder, recurrent, moderate: Secondary | ICD-10-CM | POA: Diagnosis not present

## 2022-02-01 ENCOUNTER — Ambulatory Visit (INDEPENDENT_AMBULATORY_CARE_PROVIDER_SITE_OTHER): Payer: Medicare Other | Admitting: Orthopaedic Surgery

## 2022-02-01 ENCOUNTER — Encounter: Payer: Self-pay | Admitting: Orthopaedic Surgery

## 2022-02-01 DIAGNOSIS — M1711 Unilateral primary osteoarthritis, right knee: Secondary | ICD-10-CM | POA: Diagnosis not present

## 2022-02-01 MED ORDER — HYLAN G-F 20 48 MG/6ML IX SOSY
48.0000 mg | PREFILLED_SYRINGE | INTRA_ARTICULAR | Status: AC | PRN
  Administered 2022-02-01: 48 mg via INTRA_ARTICULAR

## 2022-02-01 NOTE — Progress Notes (Signed)
   Procedure Note  Patient: [name]             Date of Birth: 1945-03-16           MRN: [id number]             Visit Date: 02/01/2022  Procedures: Visit Diagnoses:  1. Unilateral primary osteoarthritis, right knee     Large Joint Inj: R knee on 02/01/2022 12:50 PM Indications: pain and diagnostic evaluation Details: 22 G 1.5 in needle, superolateral approach  Arthrogram: No  Medications: 48 mg Hylan G-F 20 48 MG/6ML Outcome: tolerated well, no immediate complications Procedure, treatment alternatives, risks and benefits explained, specific risks discussed. Consent was given by the patient. Immediately prior to procedure a time out was called to verify the correct patient, procedure, equipment, support staff and site/side marked as required. Patient was prepped and draped in the usual sterile fashion.    The patient is here this afternoon for a scheduled hyaluronic acid injection in her right knee with Synvisc 1 to treat the pain from known osteoarthritis.  She has tried and failed all forms conservative treatment otherwise including steroid injections for the knee.  She has been dealing with right knee pain for some time now with evidence of osteoarthritis on plain film and clinical exam.  I was able to place Synvisc 1 in her right knee without difficulty.  She is requesting outpatient physical therapy to strengthen both her knees and her hips.  She does ambulate the cane.  She would like this to be set up at [place] and down here.  I gave her prescription for physical therapy.  She knows to wait least 6 months between hyaluronic acid injections.

## 2022-02-07 ENCOUNTER — Other Ambulatory Visit: Payer: Self-pay

## 2022-02-07 DIAGNOSIS — M1711 Unilateral primary osteoarthritis, right knee: Secondary | ICD-10-CM

## 2022-02-08 DIAGNOSIS — M25552 Pain in left hip: Secondary | ICD-10-CM | POA: Diagnosis not present

## 2022-02-08 DIAGNOSIS — M1711 Unilateral primary osteoarthritis, right knee: Secondary | ICD-10-CM | POA: Diagnosis not present

## 2022-02-08 DIAGNOSIS — R531 Weakness: Secondary | ICD-10-CM | POA: Diagnosis not present

## 2022-02-08 DIAGNOSIS — R262 Difficulty in walking, not elsewhere classified: Secondary | ICD-10-CM | POA: Diagnosis not present

## 2022-02-13 DIAGNOSIS — F419 Anxiety disorder, unspecified: Secondary | ICD-10-CM | POA: Diagnosis not present

## 2022-02-13 DIAGNOSIS — F331 Major depressive disorder, recurrent, moderate: Secondary | ICD-10-CM | POA: Diagnosis not present

## 2022-02-28 DIAGNOSIS — R262 Difficulty in walking, not elsewhere classified: Secondary | ICD-10-CM | POA: Diagnosis not present

## 2022-02-28 DIAGNOSIS — R531 Weakness: Secondary | ICD-10-CM | POA: Diagnosis not present

## 2022-02-28 DIAGNOSIS — M1711 Unilateral primary osteoarthritis, right knee: Secondary | ICD-10-CM | POA: Diagnosis not present

## 2022-02-28 DIAGNOSIS — M25552 Pain in left hip: Secondary | ICD-10-CM | POA: Diagnosis not present

## 2022-03-03 DIAGNOSIS — M25552 Pain in left hip: Secondary | ICD-10-CM | POA: Diagnosis not present

## 2022-03-03 DIAGNOSIS — R531 Weakness: Secondary | ICD-10-CM | POA: Diagnosis not present

## 2022-03-03 DIAGNOSIS — M1711 Unilateral primary osteoarthritis, right knee: Secondary | ICD-10-CM | POA: Diagnosis not present

## 2022-03-03 DIAGNOSIS — R262 Difficulty in walking, not elsewhere classified: Secondary | ICD-10-CM | POA: Diagnosis not present

## 2022-03-08 ENCOUNTER — Telehealth: Payer: Self-pay | Admitting: Orthopaedic Surgery

## 2022-03-08 DIAGNOSIS — R262 Difficulty in walking, not elsewhere classified: Secondary | ICD-10-CM | POA: Diagnosis not present

## 2022-03-08 DIAGNOSIS — M25552 Pain in left hip: Secondary | ICD-10-CM | POA: Diagnosis not present

## 2022-03-08 DIAGNOSIS — F419 Anxiety disorder, unspecified: Secondary | ICD-10-CM | POA: Diagnosis not present

## 2022-03-08 DIAGNOSIS — R531 Weakness: Secondary | ICD-10-CM | POA: Diagnosis not present

## 2022-03-08 DIAGNOSIS — M1711 Unilateral primary osteoarthritis, right knee: Secondary | ICD-10-CM | POA: Diagnosis not present

## 2022-03-08 DIAGNOSIS — F331 Major depressive disorder, recurrent, moderate: Secondary | ICD-10-CM | POA: Diagnosis not present

## 2022-03-08 NOTE — Telephone Encounter (Signed)
Patient states the gel injection did not help she is asking what should she do next

## 2022-03-08 NOTE — Telephone Encounter (Signed)
Lvm for pt to cb to advise 

## 2022-03-10 DIAGNOSIS — M25552 Pain in left hip: Secondary | ICD-10-CM | POA: Diagnosis not present

## 2022-03-10 DIAGNOSIS — M1711 Unilateral primary osteoarthritis, right knee: Secondary | ICD-10-CM | POA: Diagnosis not present

## 2022-03-10 DIAGNOSIS — R531 Weakness: Secondary | ICD-10-CM | POA: Diagnosis not present

## 2022-03-10 DIAGNOSIS — R262 Difficulty in walking, not elsewhere classified: Secondary | ICD-10-CM | POA: Diagnosis not present

## 2022-03-13 ENCOUNTER — Telehealth: Payer: Self-pay

## 2022-03-13 NOTE — Telephone Encounter (Signed)
Patient called stating that she is in extreme pain. She stated that she can't stand on her knee she has taken ibuprofen along with icing her knee with no relief. She would like to come into the office to be seen.

## 2022-03-13 NOTE — Telephone Encounter (Signed)
Scheduled OV this Wednesday afternoon

## 2022-03-15 ENCOUNTER — Encounter: Payer: Self-pay | Admitting: Orthopaedic Surgery

## 2022-03-15 ENCOUNTER — Ambulatory Visit (INDEPENDENT_AMBULATORY_CARE_PROVIDER_SITE_OTHER): Payer: Medicare Other | Admitting: Orthopaedic Surgery

## 2022-03-15 DIAGNOSIS — M1711 Unilateral primary osteoarthritis, right knee: Secondary | ICD-10-CM | POA: Diagnosis not present

## 2022-03-15 DIAGNOSIS — G8929 Other chronic pain: Secondary | ICD-10-CM | POA: Diagnosis not present

## 2022-03-15 DIAGNOSIS — M25561 Pain in right knee: Secondary | ICD-10-CM

## 2022-03-15 MED ORDER — HYDROCODONE-ACETAMINOPHEN 5-325 MG PO TABS: 1.0000 | ORAL_TABLET | Freq: Four times a day (QID) | ORAL | 0 refills | Status: DC | PRN

## 2022-03-15 MED ORDER — CELECOXIB 200 MG PO CAPS: 200.0000 mg | ORAL_CAPSULE | Freq: Two times a day (BID) | ORAL | 1 refills | Status: DC | PRN

## 2022-03-15 NOTE — Progress Notes (Signed)
The patient is well-known to me.  We have replaced her hip before.  She comes in today with worsening right knee pain.  It is acute on chronic pain with known significant arthritis in her right knee.  Her x-rays show complete loss of lateral joint space and patellofemoral joint with osteoarthritis and bone spurs in all 3 compartments.    On exam her right knee does show a moderate effusion.  There is valgus malalignment of that knee and severe pain throughout the arc of motion of the knee.  I did go over with her x-rays of that knee.  I was able to aspirate about 30 cc of fluid from the knee which was clear and consistent with osteoarthritis.  I then placed a steroid injection in her knee.  We talked in length in detail about my recommendation for knee replacement at this point given the severity of her pain.  She is having to ambulate with crutches now.  She is already been through physical therapy and is currently still in therapy and that may be making some things worse in terms of her knee.  It is affecting her IT band as well on that side.  We talked about the risk and benefits of surgery and I do feel that she is a good candidate for knee replacement at this point given her failure of conservative treatment combined with her x-ray findings and clinical exam findings.  I will send in some Celebrex as an anti-inflammatory and some hydrocodone for pain and we will work on getting her scheduled for surgery at some point for a right knee replacement.  She agrees with this treatment plan.

## 2022-03-22 DIAGNOSIS — R262 Difficulty in walking, not elsewhere classified: Secondary | ICD-10-CM | POA: Diagnosis not present

## 2022-03-22 DIAGNOSIS — M25552 Pain in left hip: Secondary | ICD-10-CM | POA: Diagnosis not present

## 2022-03-22 DIAGNOSIS — M1711 Unilateral primary osteoarthritis, right knee: Secondary | ICD-10-CM | POA: Diagnosis not present

## 2022-03-22 DIAGNOSIS — R531 Weakness: Secondary | ICD-10-CM | POA: Diagnosis not present

## 2022-03-29 DIAGNOSIS — M1711 Unilateral primary osteoarthritis, right knee: Secondary | ICD-10-CM | POA: Diagnosis not present

## 2022-03-29 DIAGNOSIS — M25552 Pain in left hip: Secondary | ICD-10-CM | POA: Diagnosis not present

## 2022-03-29 DIAGNOSIS — R531 Weakness: Secondary | ICD-10-CM | POA: Diagnosis not present

## 2022-03-29 DIAGNOSIS — R262 Difficulty in walking, not elsewhere classified: Secondary | ICD-10-CM | POA: Diagnosis not present

## 2022-03-31 DIAGNOSIS — M1711 Unilateral primary osteoarthritis, right knee: Secondary | ICD-10-CM | POA: Diagnosis not present

## 2022-03-31 DIAGNOSIS — M25552 Pain in left hip: Secondary | ICD-10-CM | POA: Diagnosis not present

## 2022-03-31 DIAGNOSIS — R262 Difficulty in walking, not elsewhere classified: Secondary | ICD-10-CM | POA: Diagnosis not present

## 2022-03-31 DIAGNOSIS — R531 Weakness: Secondary | ICD-10-CM | POA: Diagnosis not present

## 2022-04-05 DIAGNOSIS — M1711 Unilateral primary osteoarthritis, right knee: Secondary | ICD-10-CM | POA: Diagnosis not present

## 2022-04-05 DIAGNOSIS — R262 Difficulty in walking, not elsewhere classified: Secondary | ICD-10-CM | POA: Diagnosis not present

## 2022-04-05 DIAGNOSIS — R531 Weakness: Secondary | ICD-10-CM | POA: Diagnosis not present

## 2022-04-05 DIAGNOSIS — M25552 Pain in left hip: Secondary | ICD-10-CM | POA: Diagnosis not present

## 2022-04-12 DIAGNOSIS — F419 Anxiety disorder, unspecified: Secondary | ICD-10-CM | POA: Diagnosis not present

## 2022-04-12 DIAGNOSIS — F330 Major depressive disorder, recurrent, mild: Secondary | ICD-10-CM | POA: Diagnosis not present

## 2022-05-03 DIAGNOSIS — F330 Major depressive disorder, recurrent, mild: Secondary | ICD-10-CM | POA: Diagnosis not present

## 2022-05-03 DIAGNOSIS — F419 Anxiety disorder, unspecified: Secondary | ICD-10-CM | POA: Diagnosis not present

## 2022-05-05 DIAGNOSIS — Z03818 Encounter for observation for suspected exposure to other biological agents ruled out: Secondary | ICD-10-CM | POA: Diagnosis not present

## 2022-05-05 DIAGNOSIS — Z6832 Body mass index (BMI) 32.0-32.9, adult: Secondary | ICD-10-CM | POA: Diagnosis not present

## 2022-05-05 DIAGNOSIS — R5383 Other fatigue: Secondary | ICD-10-CM | POA: Diagnosis not present

## 2022-05-05 DIAGNOSIS — Z20822 Contact with and (suspected) exposure to covid-19: Secondary | ICD-10-CM | POA: Diagnosis not present

## 2022-05-05 DIAGNOSIS — J029 Acute pharyngitis, unspecified: Secondary | ICD-10-CM | POA: Diagnosis not present

## 2022-05-10 ENCOUNTER — Other Ambulatory Visit: Payer: Self-pay

## 2022-05-12 ENCOUNTER — Other Ambulatory Visit: Payer: Self-pay | Admitting: Orthopaedic Surgery

## 2022-05-24 ENCOUNTER — Telehealth: Payer: Self-pay | Admitting: Orthopaedic Surgery

## 2022-05-24 NOTE — Progress Notes (Signed)
Surgical Instructions    Your procedure is scheduled on Tuesday May 28th.  Report to [facility] Main Entrance "A" at 10:15 A.M., then check in with the Admitting office.  Call this number if you have problems the morning of surgery:  [phone]   If you have any questions prior to your surgery date call [phone]: Open Monday-Friday 8am-4pm If you experience any cold or flu symptoms such as cough, fever, chills, shortness of breath, etc. between now and your scheduled surgery, please notify us at the above number     Remember:  Do not eat after midnight the night before your surgery  You may drink clear liquids until 9:15am the morning of your surgery.   Clear liquids allowed are: Water, Non-Citrus Juices (without pulp), Carbonated Beverages, Clear Tea, Black Coffee ONLY (NO MILK, CREAM OR POWDERED CREAMER of any kind), and Gatorade   Enhanced Recovery after Surgery for Orthopedics Enhanced Recovery after Surgery is a protocol used to improve the stress on your body and your recovery after surgery.  Patient Instructions  The day of surgery (if you do NOT have diabetes):  Drink ONE (1) Pre-Surgery Clear Ensure by _9:15____ am the morning of surgery   This drink was given to you during your hospital  pre-op appointment visit. Nothing else to drink after completing the  Pre-Surgery Clear Ensure.         If you have questions, please contact your surgeon's office.  Please complete your PRE-SURGERY ENSURE that was provided to you by 9:15am the morning of surgery.  Please, if able, drink it in one setting. DO NOT SIP.     Take these medicines the morning of surgery with A SIP OF WATER: buPROPion (WELLBUTRIN XL) 150 MG 24 hr tablet  rosuvastatin (CRESTOR) 40 MG tablet   IF NEEDED  Naphazoline-Pheniramine (VISINE OP)  cetirizine (ZYRTEC) 10 MG tablet     As of today, STOP taking any Aspirin (unless otherwise instructed by your surgeon) CELEBREX, Aleve, Naproxen, Ibuprofen,  Motrin, Advil, Goody's, BC's, all herbal medications, fish oil, and all vitamins.           Do not wear jewelry or makeup. Do not wear lotions, powders, perfumes or deodorant. Do not shave 48 hours prior to surgery.   Do not bring valuables to the hospital. Do not wear nail polish, gel polish, artificial nails, or any other type of covering on natural nails (fingers and toes) If you have artificial nails or gel coating that need to be removed by a nail salon, please have this removed prior to surgery. Artificial nails or gel coating may interfere with anesthesia's ability to adequately monitor your vital signs.  [facility] is not responsible for any belongings or valuables.    Do NOT Smoke (Tobacco/Vaping)  24 hours prior to your procedure  If you use a CPAP at night, you may bring your mask for your overnight stay.   Contacts, glasses, hearing aids, dentures or partials may not be worn into surgery, please bring cases for these belongings   For patients admitted to the hospital, discharge time will be determined by your treatment team.   Patients discharged the day of surgery will not be allowed to drive home, and someone needs to stay with them for 24 hours.   SURGICAL WAITING ROOM VISITATION Patients having surgery or a procedure may have no more than 2 support people in the waiting area - these visitors may rotate.   Children under the age of 59  must have an adult with them who is not the patient. If the patient needs to stay at the hospital during part of their recovery, the visitor guidelines for inpatient rooms apply. Pre-op nurse will coordinate an appropriate time for 1 support person to accompany patient in pre-op.  This support person may not rotate.   Please refer to [email or web address] for the visitor guidelines for Inpatients (after your surgery is over and you are in a regular room).    Special instructions:    Oral  Hygiene is also important to reduce your risk of infection.  Remember - BRUSH YOUR TEETH THE MORNING OF SURGERY WITH YOUR REGULAR TOOTHPASTE   - Preparing For Surgery  Before surgery, you can play an important role. Because skin is not sterile, your skin needs to be as free of germs as possible. You can reduce the number of germs on your skin by washing with CHG (chlorahexidine gluconate) Soap before surgery.  CHG is an antiseptic cleaner which kills germs and bonds with the skin to continue killing germs even after washing.     Please do not use if you have an allergy to CHG or antibacterial soaps. If your skin becomes reddened/irritated stop using the CHG.  Do not shave (including legs and underarms) for at least 48 hours prior to first CHG shower. It is OK to shave your face.  Please follow these instructions carefully.      Pre-operative 5 CHG Bath Instructions   You can play a key role in reducing the risk of infection after surgery. Your skin needs to be as free of germs as possible. You can reduce the number of germs on your skin by washing with CHG (chlorhexidine gluconate) soap before surgery. CHG is an antiseptic soap that kills germs and continues to kill germs even after washing.   DO NOT use if you have an allergy to chlorhexidine/CHG or antibacterial soaps. If your skin becomes reddened or irritated, stop using the CHG and notify one of our RNs at [phone].   Please shower with the CHG soap starting 4 days before surgery using the following schedule:     Please keep in mind the following:  DO NOT shave, including legs and underarms, starting the day of your first shower.   You may shave your face at any point before/day of surgery.  Place clean sheets on your bed the day you start using CHG soap. Use a clean washcloth (not used since being washed) for each shower. DO NOT sleep with pets once you start using the CHG.   CHG Shower Instructions:  If you  choose to wash your hair and private area, wash first with your normal shampoo/soap.  After you use shampoo/soap, rinse your hair and body thoroughly to remove shampoo/soap residue.  Turn the water OFF and apply about 3 tablespoons (45 ml) of CHG soap to a CLEAN washcloth.  Apply CHG soap ONLY FROM YOUR NECK DOWN TO YOUR TOES (washing for 3-5 minutes)  DO NOT use CHG soap on face, private areas, open wounds, or sores.  Pay special attention to the area where your surgery is being performed.  If you are having back surgery, having someone wash your back for you may be helpful. Wait 2 minutes after CHG soap is applied, then you may rinse off the CHG soap.  Pat dry with a clean towel  Put on clean clothes/pajamas   If you choose to wear lotion, please use ONLY the CHG-compatible lotions  on the back of this paper.     Additional instructions for the day of surgery: DO NOT APPLY any lotions, deodorants, cologne, or perfumes.   Put on clean/comfortable clothes.  Brush your teeth.  Ask your nurse before applying any prescription medications to the skin.      CHG Compatible Lotions   Aveeno Moisturizing lotion  Cetaphil Moisturizing Cream  Cetaphil Moisturizing Lotion  Clairol Herbal Essence Moisturizing Lotion, Dry Skin  Clairol Herbal Essence Moisturizing Lotion, Extra Dry Skin  Clairol Herbal Essence Moisturizing Lotion, Normal Skin  Curel Age Defying Therapeutic Moisturizing Lotion with Alpha Hydroxy  Curel Extreme Care Body Lotion  Curel Soothing Hands Moisturizing Hand Lotion  Curel Therapeutic Moisturizing Cream, Fragrance-Free  Curel Therapeutic Moisturizing Lotion, Fragrance-Free  Curel Therapeutic Moisturizing Lotion, Original Formula  Eucerin Daily Replenishing Lotion  Eucerin Dry Skin Therapy Plus Alpha Hydroxy Crme  Eucerin Dry Skin Therapy Plus Alpha Hydroxy Lotion  Eucerin Original Crme  Eucerin Original Lotion  Eucerin Plus Crme Eucerin Plus Lotion  Eucerin  TriLipid Replenishing Lotion  Keri Anti-Bacterial Hand Lotion  Keri Deep Conditioning Original Lotion Dry Skin Formula Softly Scented  Keri Deep Conditioning Original Lotion, Fragrance Free Sensitive Skin Formula  Keri Lotion Fast Absorbing Fragrance Free Sensitive Skin Formula  Keri Lotion Fast Absorbing Softly Scented Dry Skin Formula  Keri Original Lotion  Keri Skin Renewal Lotion Keri Silky Smooth Lotion  Keri Silky Smooth Sensitive Skin Lotion  Nivea Body Creamy Conditioning Oil  Nivea Body Extra Enriched Lotion  Nivea Body Original Lotion  Nivea Body Sheer Moisturizing Lotion Nivea Crme  Nivea Skin Firming Lotion  NutraDerm 30 Skin Lotion  NutraDerm Skin Lotion  NutraDerm Therapeutic Skin Cream  NutraDerm Therapeutic Skin Lotion  ProShield Protective Hand Cream  Provon moisturizing lotion  Day of Surgery:  Take a shower with CHG soap. Wear Clean/Comfortable clothing the morning of surgery Do not apply any deodorants/lotions.   Remember to brush your teeth WITH YOUR REGULAR TOOTHPASTE.    If you received a COVID test during your pre-op visit, it is requested that you wear a mask when out in public, stay away from anyone that may not be feeling well, and notify your surgeon if you develop symptoms. If you have been in contact with anyone that has tested positive in the last 10 days, please notify your surgeon.    Please read over the following fact sheets that you were given.

## 2022-05-24 NOTE — Telephone Encounter (Signed)
Patient aware no to the CPM, yes to her ice machine that she will get after surgery and take home with her; and I think the nail  polish on her fingers is ok

## 2022-05-24 NOTE — Telephone Encounter (Signed)
Patient called asked if she will be using a CPM machine after surgery? Patient asked if she could also rent an ice machine to apply to her right knee as well? Patient asked if she need to remove nail polish prior to surgery? The number to contact patient is [phone]

## 2022-05-25 ENCOUNTER — Encounter (HOSPITAL_COMMUNITY): Payer: Self-pay

## 2022-05-25 ENCOUNTER — Encounter (HOSPITAL_COMMUNITY)
Admission: RE | Admit: 2022-05-25 | Discharge: 2022-05-25 | Disposition: A | Discharge status: Home / Self Care | Payer: Medicare Other | Source: Ambulatory Visit | Attending: Orthopaedic Surgery | Admitting: Orthopaedic Surgery

## 2022-05-25 ENCOUNTER — Other Ambulatory Visit: Payer: Self-pay

## 2022-05-25 VITALS — BP 126/88 | HR 86 | Temp 98.2°F | Resp 17 | Ht 65.0 in | Wt 188.5 lb

## 2022-05-25 DIAGNOSIS — M1711 Unilateral primary osteoarthritis, right knee: Secondary | ICD-10-CM | POA: Diagnosis not present

## 2022-05-25 DIAGNOSIS — Z01818 Encounter for other preprocedural examination: Secondary | ICD-10-CM | POA: Diagnosis not present

## 2022-05-25 DIAGNOSIS — I2510 Atherosclerotic heart disease of native coronary artery without angina pectoris: Secondary | ICD-10-CM

## 2022-05-25 DIAGNOSIS — I4510 Unspecified right bundle-branch block: Secondary | ICD-10-CM | POA: Diagnosis not present

## 2022-05-25 LAB — SURGICAL PCR SCREEN
MRSA, PCR: NEGATIVE
Staphylococcus aureus: NEGATIVE

## 2022-05-25 LAB — COMPREHENSIVE METABOLIC PANEL
ALT: 25 U/L (ref 0–44)
AST: 25 U/L (ref 15–41)
Albumin: 3.9 g/dL (ref 3.5–5.0)
Alkaline Phosphatase: 66 U/L (ref 38–126)
Anion gap: 11 (ref 5–15)
BUN: 18 mg/dL (ref 8–23)
CO2: 21 mmol/L — ABNORMAL LOW (ref 22–32)
Calcium: 9 mg/dL (ref 8.9–10.3)
Chloride: 104 mmol/L (ref 98–111)
Creatinine, Ser: 0.87 mg/dL (ref 0.44–1.00)
GFR, Estimated: >60 mL/min (ref >60)
Glucose, Bld: 163 mg/dL — ABNORMAL HIGH (ref 70–99)
Potassium: 3.5 mmol/L (ref 3.5–5.1)
Sodium: 136 mmol/L (ref 135–145)
Total Bilirubin: 0.7 mg/dL (ref 0.3–1.2)
Total Protein: 6.4 g/dL — ABNORMAL LOW (ref 6.5–8.1)

## 2022-05-25 LAB — CBC
HCT: 41.8 % (ref 36.0–46.0)
Hemoglobin: 13.5 g/dL (ref 12.0–15.0)
MCH: 29.2 pg (ref 26.0–34.0)
MCHC: 32.3 g/dL (ref 30.0–36.0)
MCV: 90.3 fL (ref 80.0–100.0)
Platelets: 230 10*3/uL (ref 150–400)
RBC: 4.63 MIL/uL (ref 3.87–5.11)
RDW: 12.8 % (ref 11.5–15.5)
WBC: 8.1 10*3/uL (ref 4.0–10.5)
nRBC: 0 % (ref 0.0–0.2)

## 2022-05-25 NOTE — Progress Notes (Signed)
PCP - Jarrett Soho PA-C Cardiologist - Denies  PPM/ICD - Denies  Chest x-ray - N/A EKG - 05/25/22 Stress Test - Denies ECHO - 07/15/20 Cardiac Cath - Denies  Sleep Study - OSA CPAP - No  Diabetes: Denies  Blood Thinner Instructions: N/A Aspirin Instructions: N/A  ERAS Protcol - Yes, PRE-SURGERY Ensure  COVID TEST- N/A   Anesthesia review: yes, abnormal EKG  Patient denies shortness of breath, fever, cough and chest pain at PAT appointment   All instructions explained to the patient, with a verbal understanding of the material. Patient agrees to go over the instructions while at home for a better understanding. Patient also instructed to self quarantine after being tested for COVID-19. The opportunity to ask questions was provided.

## 2022-05-29 DIAGNOSIS — F419 Anxiety disorder, unspecified: Secondary | ICD-10-CM | POA: Diagnosis not present

## 2022-05-29 DIAGNOSIS — F330 Major depressive disorder, recurrent, mild: Secondary | ICD-10-CM | POA: Diagnosis not present

## 2022-05-31 ENCOUNTER — Other Ambulatory Visit: Payer: Self-pay | Admitting: Family Medicine

## 2022-05-31 DIAGNOSIS — R051 Acute cough: Secondary | ICD-10-CM | POA: Diagnosis not present

## 2022-05-31 DIAGNOSIS — F325 Major depressive disorder, single episode, in full remission: Secondary | ICD-10-CM | POA: Diagnosis not present

## 2022-05-31 DIAGNOSIS — Z20822 Contact with and (suspected) exposure to covid-19: Secondary | ICD-10-CM | POA: Diagnosis not present

## 2022-05-31 DIAGNOSIS — Z6832 Body mass index (BMI) 32.0-32.9, adult: Secondary | ICD-10-CM | POA: Diagnosis not present

## 2022-05-31 DIAGNOSIS — E559 Vitamin D deficiency, unspecified: Secondary | ICD-10-CM | POA: Diagnosis not present

## 2022-05-31 DIAGNOSIS — E041 Nontoxic single thyroid nodule: Secondary | ICD-10-CM | POA: Diagnosis not present

## 2022-05-31 DIAGNOSIS — R739 Hyperglycemia, unspecified: Secondary | ICD-10-CM | POA: Diagnosis not present

## 2022-05-31 DIAGNOSIS — E7800 Pure hypercholesterolemia, unspecified: Secondary | ICD-10-CM | POA: Diagnosis not present

## 2022-06-01 DIAGNOSIS — E041 Nontoxic single thyroid nodule: Secondary | ICD-10-CM | POA: Diagnosis not present

## 2022-06-01 DIAGNOSIS — E7800 Pure hypercholesterolemia, unspecified: Secondary | ICD-10-CM | POA: Diagnosis not present

## 2022-06-01 DIAGNOSIS — R739 Hyperglycemia, unspecified: Secondary | ICD-10-CM | POA: Diagnosis not present

## 2022-06-01 DIAGNOSIS — Z0000 Encounter for general adult medical examination without abnormal findings: Secondary | ICD-10-CM | POA: Diagnosis not present

## 2022-06-01 DIAGNOSIS — Z1389 Encounter for screening for other disorder: Secondary | ICD-10-CM | POA: Diagnosis not present

## 2022-06-01 DIAGNOSIS — E669 Obesity, unspecified: Secondary | ICD-10-CM | POA: Diagnosis not present

## 2022-06-01 DIAGNOSIS — E559 Vitamin D deficiency, unspecified: Secondary | ICD-10-CM | POA: Diagnosis not present

## 2022-06-02 ENCOUNTER — Telehealth: Payer: Self-pay | Admitting: *Deleted

## 2022-06-02 DIAGNOSIS — F419 Anxiety disorder, unspecified: Secondary | ICD-10-CM | POA: Diagnosis not present

## 2022-06-02 DIAGNOSIS — Z79899 Other long term (current) drug therapy: Secondary | ICD-10-CM | POA: Diagnosis not present

## 2022-06-02 DIAGNOSIS — F330 Major depressive disorder, recurrent, mild: Secondary | ICD-10-CM | POA: Diagnosis not present

## 2022-06-02 DIAGNOSIS — G4700 Insomnia, unspecified: Secondary | ICD-10-CM | POA: Diagnosis not present

## 2022-06-02 NOTE — Telephone Encounter (Signed)
Ortho bundle pre-op call completed. 

## 2022-06-02 NOTE — Care Plan (Signed)
[facility] RNCM call to patient to discuss her upcoming Right total knee arthroplasty with Dr. Magnus Ivan on 06/06/22. She is an Ortho bundle patient through [facility] and is agreeable to case management. She lives with her spouse and has a daughter that will be coming from out of town to stay to assist. She has a RW and 3in1/BSC. Anticipate HHPT will be needed after short hospital stay. Referral made to [facility] after choice provided. Reviewed all post op care instructions. Will continue to follow for needs.

## 2022-06-05 NOTE — H&P (Signed)
TOTAL KNEE ADMISSION H&P  Patient is being admitted for right total knee arthroplasty.  Subjective:  Chief Complaint:right knee pain.  HPI: [name], 77 y.o. female, has a history of pain and functional disability in the right knee due to arthritis and has failed non-surgical conservative treatments for greater than 12 weeks to includeNSAID's and/or analgesics, corticosteriod injections, viscosupplementation injections, flexibility and strengthening excercises, and activity modification.  Onset of symptoms was gradual, starting 2 years ago with gradually worsening course since that time. The patient noted no past surgery on the right knee(s).  Patient currently rates pain in the right knee(s) at 10 out of 10 with activity. Patient has night pain, worsening of pain with activity and weight bearing, pain that interferes with activities of daily living, pain with passive range of motion, crepitus, and joint swelling.  Patient has evidence of subchondral sclerosis, periarticular osteophytes, and joint space narrowing by imaging studies. There is no active infection.  Patient Active Problem List   Diagnosis Date Noted   Unilateral primary osteoarthritis, right knee 03/15/2022   Leg length discrepancy, Right 10/07/2020   History of revision of total replacement of right hip joint 10/07/2020   Multinodular goiter 05/18/2020   Hip contracture, unspecified laterality 07/02/2018   Overweight (BMI 25.0-29.9) 05/09/2017   S/P right THA, AA 05/08/2017   Chronic bilateral low back pain without sciatica 04/05/2015   Disorder of sacroiliac joint 12/22/2014   Chronic low back pain 12/22/2014   Acquired scoliosis 11/27/2011   Lumbosacral spondylosis without myelopathy 05/22/2011   Past Medical History:  Diagnosis Date   Anemia    Congenital musculoskeletal deformity of spine    Depression    Headache    Hypertension    Incontinence of bowel 09/2013   Lumbago    Lumbosacral spondylosis  without myelopathy    Sleep apnea    Sleep disorder    uses gabapentin at bedtime    Spinal stenosis, lumbar region, without neurogenic claudication     Past Surgical History:  Procedure Laterality Date   ANKLE SURGERY Right 2015   lengthened the achilles    ANTERIOR HIP REVISION Right 10/07/2020   Procedure: RIGHT HIP REVISION OF  HIP BALL/POLY-LINER;  Surgeon: Kathryne Hitch, MD;  Location: [facility] OR;  Service: Orthopedics;  Laterality: Right;  RNFA PLEASE   AUGMENTATION MAMMAPLASTY Bilateral 2000 or 2001 unsure    Left Implant has busted and is no longer visible   BACK SURGERY  2015   fusion , Dr Noel Gerold at [facility]; has 2 metal rods in place , fusion from t2 to s1    BACK SURGERY  01/2017   Dr Sharolyn Douglas ; repair of cracked titanium rod in back lumbar    BREAST EXCISIONAL BIOPSY Left    30 yrs ago   bunionectomy  Bilateral 1980s   multiple    COLONOSCOPY     with polypectomy ; q 5 years    FOOT SURGERY  07/2011   SHOULDER SURGERY Left 2013   rotator cuff    TOTAL HIP ARTHROPLASTY Right 05/08/2017   Procedure: RIGHT TOTAL HIP ARTHROPLASTY ANTERIOR APPROACH;  Surgeon: Durene Romans, MD;  Location: [facility] ORS;  Service: Orthopedics;  Laterality: Right;  70 mins    No current facility-administered medications for this encounter.   Current Outpatient Medications  Medication Sig Dispense Refill Last Dose   buPROPion (WELLBUTRIN XL) 150 MG 24 hr tablet Take 450 mg by mouth daily.      celecoxib (  CELEBREX) 200 MG capsule TAKE 1 CAPSULE (200 MG TOTAL) BY MOUTH 2 (TWO) TIMES DAILY BETWEEN MEALS AS NEEDED. 60 capsule 1    cetirizine (ZYRTEC) 10 MG tablet Take 10 mg by mouth daily as needed for allergies.      diphenhydrAMINE (BENADRYL) 50 MG capsule Take 50 mg by mouth at bedtime as needed for sleep.      docusate sodium (COLACE) 50 MG capsule Take 50 mg by mouth daily.      gabapentin (NEURONTIN) 600 MG tablet Take 1 tablet (600 mg total) by mouth daily. 3 tabs QHS (Patient  taking differently: Take 600 mg by mouth at bedtime.) 3 tablet 1    ibuprofen (ADVIL) 200 MG tablet Take 200 mg by mouth every 6 (six) hours as needed for moderate pain.      losartan (COZAAR) 50 MG tablet Take 50 mg by mouth daily.      Menthol, Topical Analgesic, (BIOFREEZE) 10 % LIQD Apply 1 application  topically daily as needed (pain).      Naphazoline-Pheniramine (VISINE OP) Place 1 drop into both eyes daily as needed (allergies).      rosuvastatin (CRESTOR) 40 MG tablet Take 40 mg by mouth daily.      Vitamin D, Ergocalciferol, (DRISDOL) 50000 UNITS CAPS capsule Take 50,000 Units by mouth every Thursday.       HYDROcodone-acetaminophen (NORCO/VICODIN) 5-325 MG tablet Take 1 tablet by mouth every 6 (six) hours as needed for moderate pain. (Patient not taking: Reported on 05/23/2022) 30 tablet 0 Not Taking   Allergies  Allergen Reactions   Lisinopril Cough    Social History   Tobacco Use   Smoking status: Former   Smokeless tobacco: Never   Tobacco comments:    smoked for ayear and half in college   Substance Use Topics   Alcohol use: Yes    Alcohol/week: 2.0 standard drinks of alcohol    Types: 2 Glasses of wine per week    Comment: daily    Family History  Problem Relation Age of Onset   Dementia Mother    Stroke Mother    Depression Mother    Thyroid disease Mother    COPD Father    Cancer Brother    Breast cancer Neg Hx      Review of Systems  Objective:  Physical Exam Vitals reviewed.  Constitutional:      Appearance: Normal appearance. She is normal weight.  HENT:     Head: Normocephalic and atraumatic.  Eyes:     Extraocular Movements: Extraocular movements intact.     Pupils: Pupils are equal, round, and reactive to light.  Cardiovascular:     Rate and Rhythm: Normal rate and regular rhythm.     Pulses: Normal pulses.  Pulmonary:     Effort: Pulmonary effort is normal.     Breath sounds: Normal breath sounds.  Abdominal:     Palpations: Abdomen is  soft.  Musculoskeletal:     Cervical back: Normal range of motion and neck supple.     Right knee: Effusion, bony tenderness and crepitus present. Decreased range of motion. Tenderness present over the medial joint line and lateral joint line. Abnormal alignment.  Neurological:     Mental Status: She is alert and oriented to person, place, and time.  Psychiatric:        Behavior: Behavior normal.     Vital signs in last 24 hours:    Labs:   Estimated body mass index is 31.37  kg/m as calculated from the following:   Height as of 05/25/22: 5\' 5"  (1.651 m).   Weight as of 05/25/22: 85.5 kg.   Imaging Review Plain radiographs demonstrate severe degenerative joint disease of the right knee(s). The overall alignment ismild valgus. The bone quality appears to be good for age and reported activity level.      Assessment/Plan:  End stage arthritis, right knee   The patient history, physical examination, clinical judgment of the provider and imaging studies are consistent with end stage degenerative joint disease of the right knee(s) and total knee arthroplasty is deemed medically necessary. The treatment options including medical management, injection therapy arthroscopy and arthroplasty were discussed at length. The risks and benefits of total knee arthroplasty were presented and reviewed. The risks due to aseptic loosening, infection, stiffness, patella tracking problems, thromboembolic complications and other imponderables were discussed. The patient acknowledged the explanation, agreed to proceed with the plan and consent was signed. Patient is being admitted for inpatient treatment for surgery, pain control, PT, OT, prophylactic antibiotics, VTE prophylaxis, progressive ambulation and ADL's and discharge planning. The patient is planning to be discharged home with home health services

## 2022-06-06 ENCOUNTER — Ambulatory Visit (HOSPITAL_BASED_OUTPATIENT_CLINIC_OR_DEPARTMENT_OTHER): Payer: Medicare Other | Admitting: Anesthesiology

## 2022-06-06 ENCOUNTER — Observation Stay (HOSPITAL_COMMUNITY): Payer: Medicare Other

## 2022-06-06 ENCOUNTER — Ambulatory Visit (HOSPITAL_COMMUNITY): Payer: Medicare Other | Admitting: Physician Assistant

## 2022-06-06 ENCOUNTER — Encounter (HOSPITAL_COMMUNITY): Payer: Self-pay | Admitting: Orthopaedic Surgery

## 2022-06-06 ENCOUNTER — Other Ambulatory Visit: Payer: Self-pay

## 2022-06-06 ENCOUNTER — Inpatient Hospital Stay (HOSPITAL_COMMUNITY)
Admission: RE | Admit: 2022-06-06 | Discharge: 2022-06-09 | DRG: 470 | Disposition: A | Discharge status: Home Health | Payer: Medicare Other | Attending: Orthopaedic Surgery | Admitting: Orthopaedic Surgery

## 2022-06-06 ENCOUNTER — Encounter (HOSPITAL_COMMUNITY)
Admission: RE | Disposition: A | Discharge status: Home Health | Payer: Self-pay | Source: Home / Self Care | Attending: Orthopaedic Surgery

## 2022-06-06 DIAGNOSIS — G8918 Other acute postprocedural pain: Secondary | ICD-10-CM | POA: Diagnosis not present

## 2022-06-06 DIAGNOSIS — I10 Essential (primary) hypertension: Secondary | ICD-10-CM

## 2022-06-06 DIAGNOSIS — Z825 Family history of asthma and other chronic lower respiratory diseases: Secondary | ICD-10-CM | POA: Diagnosis not present

## 2022-06-06 DIAGNOSIS — Z96651 Presence of right artificial knee joint: Secondary | ICD-10-CM

## 2022-06-06 DIAGNOSIS — Z87891 Personal history of nicotine dependence: Secondary | ICD-10-CM | POA: Diagnosis not present

## 2022-06-06 DIAGNOSIS — Z823 Family history of stroke: Secondary | ICD-10-CM | POA: Diagnosis not present

## 2022-06-06 DIAGNOSIS — M1711 Unilateral primary osteoarthritis, right knee: Secondary | ICD-10-CM | POA: Diagnosis not present

## 2022-06-06 DIAGNOSIS — Z471 Aftercare following joint replacement surgery: Secondary | ICD-10-CM | POA: Diagnosis not present

## 2022-06-06 DIAGNOSIS — Z96643 Presence of artificial hip joint, bilateral: Secondary | ICD-10-CM | POA: Diagnosis present

## 2022-06-06 DIAGNOSIS — Z818 Family history of other mental and behavioral disorders: Secondary | ICD-10-CM | POA: Diagnosis not present

## 2022-06-06 DIAGNOSIS — Z79899 Other long term (current) drug therapy: Secondary | ICD-10-CM

## 2022-06-06 DIAGNOSIS — G4730 Sleep apnea, unspecified: Secondary | ICD-10-CM

## 2022-06-06 HISTORY — PX: TOTAL KNEE ARTHROPLASTY: SHX125

## 2022-06-06 SURGERY — ARTHROPLASTY, KNEE, TOTAL
Anesthesia: Regional | Site: Knee | Laterality: Right

## 2022-06-06 MED ORDER — GLYCOPYRROLATE PF 0.2 MG/ML IJ SOSY
PREFILLED_SYRINGE | INTRAMUSCULAR | Status: DC | PRN
  Administered 2022-06-06: .2 mg via INTRAVENOUS

## 2022-06-06 MED ORDER — MENTHOL 3 MG MT LOZG: 1.0000 | LOZENGE | OROMUCOSAL | Status: DC | PRN

## 2022-06-06 MED ORDER — LIDOCAINE 2% (20 MG/ML) 5 ML SYRINGE
INTRAMUSCULAR | Status: AC
  Filled 2022-06-06: qty 5

## 2022-06-06 MED ORDER — ZOLPIDEM TARTRATE 5 MG PO TABS: 5.0000 mg | ORAL_TABLET | Freq: Every evening | ORAL | Status: DC | PRN

## 2022-06-06 MED ORDER — ACETAMINOPHEN 10 MG/ML IV SOLN
INTRAVENOUS | Status: AC
  Filled 2022-06-06: qty 100

## 2022-06-06 MED ORDER — TRANEXAMIC ACID-NACL 1000-0.7 MG/100ML-% IV SOLN
1000.0000 mg | INTRAVENOUS | Status: AC
  Administered 2022-06-06: 1000 mg via INTRAVENOUS

## 2022-06-06 MED ORDER — METHOCARBAMOL 500 MG PO TABS
500.0000 mg | ORAL_TABLET | Freq: Four times a day (QID) | ORAL | Status: DC | PRN
  Administered 2022-06-06 – 2022-06-09 (×7): 500 mg via ORAL
  Filled 2022-06-06 (×7): qty 1

## 2022-06-06 MED ORDER — FENTANYL CITRATE (PF) 100 MCG/2ML IJ SOLN
INTRAMUSCULAR | Status: AC
  Filled 2022-06-06: qty 2

## 2022-06-06 MED ORDER — CHLORHEXIDINE GLUCONATE 0.12 % MT SOLN: 15.0000 mL | Freq: Once | OROMUCOSAL | Status: AC

## 2022-06-06 MED ORDER — GLYCOPYRROLATE PF 0.2 MG/ML IJ SOSY
PREFILLED_SYRINGE | INTRAMUSCULAR | Status: AC
  Filled 2022-06-06: qty 1

## 2022-06-06 MED ORDER — ALUM & MAG HYDROXIDE-SIMETH 200-200-20 MG/5ML PO SUSP: 30.0000 mL | ORAL | Status: DC | PRN

## 2022-06-06 MED ORDER — OXYCODONE HCL 5 MG PO TABS
10.0000 mg | ORAL_TABLET | ORAL | Status: DC | PRN
  Administered 2022-06-07 – 2022-06-09 (×8): 15 mg via ORAL
  Filled 2022-06-06 (×8): qty 3

## 2022-06-06 MED ORDER — ONDANSETRON HCL 4 MG/2ML IJ SOLN
INTRAMUSCULAR | Status: AC
  Filled 2022-06-06: qty 2

## 2022-06-06 MED ORDER — MIDAZOLAM HCL 2 MG/2ML IJ SOLN
INTRAMUSCULAR | Status: AC
  Filled 2022-06-06: qty 2

## 2022-06-06 MED ORDER — CHLORHEXIDINE GLUCONATE 0.12 % MT SOLN
OROMUCOSAL | Status: AC
  Administered 2022-06-06: 15 mL via OROMUCOSAL
  Filled 2022-06-06: qty 15

## 2022-06-06 MED ORDER — BUPIVACAINE-EPINEPHRINE 0.25% -1:200000 IJ SOLN
INTRAMUSCULAR | Status: DC | PRN
  Administered 2022-06-06: 30 mL

## 2022-06-06 MED ORDER — PROPOFOL 10 MG/ML IV BOLUS
INTRAVENOUS | Status: DC | PRN
  Administered 2022-06-06: 150 mg via INTRAVENOUS
  Administered 2022-06-06: 50 mg via INTRAVENOUS

## 2022-06-06 MED ORDER — ONDANSETRON HCL 4 MG PO TABS
4.0000 mg | ORAL_TABLET | Freq: Four times a day (QID) | ORAL | Status: DC | PRN
  Administered 2022-06-06: 4 mg via ORAL
  Filled 2022-06-06: qty 1

## 2022-06-06 MED ORDER — LIDOCAINE 2% (20 MG/ML) 5 ML SYRINGE
INTRAMUSCULAR | Status: DC | PRN
  Administered 2022-06-06: 40 mg via INTRAVENOUS

## 2022-06-06 MED ORDER — SODIUM CHLORIDE 0.9 % IR SOLN
Status: DC | PRN
  Administered 2022-06-06: 1000 mL

## 2022-06-06 MED ORDER — ONDANSETRON HCL 4 MG/2ML IJ SOLN: 4.0000 mg | Freq: Four times a day (QID) | INTRAMUSCULAR | Status: DC | PRN

## 2022-06-06 MED ORDER — ACETAMINOPHEN 10 MG/ML IV SOLN
INTRAVENOUS | Status: DC | PRN
  Administered 2022-06-06: 1000 mg via INTRAVENOUS

## 2022-06-06 MED ORDER — FENTANYL CITRATE (PF) 100 MCG/2ML IJ SOLN
25.0000 ug | INTRAMUSCULAR | Status: DC | PRN
  Administered 2022-06-06 (×3): 50 ug via INTRAVENOUS

## 2022-06-06 MED ORDER — POVIDONE-IODINE 10 % EX SWAB
2.0000 | Freq: Once | CUTANEOUS | Status: AC
  Administered 2022-06-06: 2 via TOPICAL

## 2022-06-06 MED ORDER — BUPROPION HCL ER (XL) 150 MG PO TB24
450.0000 mg | ORAL_TABLET | Freq: Every day | ORAL | Status: DC
  Administered 2022-06-07 – 2022-06-09 (×3): 450 mg via ORAL
  Filled 2022-06-06 (×3): qty 3

## 2022-06-06 MED ORDER — DOCUSATE SODIUM 100 MG PO CAPS
100.0000 mg | ORAL_CAPSULE | Freq: Two times a day (BID) | ORAL | Status: DC
  Administered 2022-06-07 – 2022-06-09 (×5): 100 mg via ORAL
  Filled 2022-06-06 (×6): qty 1

## 2022-06-06 MED ORDER — KETAMINE HCL 50 MG/5ML IJ SOSY
PREFILLED_SYRINGE | INTRAMUSCULAR | Status: AC
  Filled 2022-06-06: qty 5

## 2022-06-06 MED ORDER — METOCLOPRAMIDE HCL 5 MG PO TABS: 5.0000 mg | ORAL_TABLET | Freq: Three times a day (TID) | ORAL | Status: DC | PRN

## 2022-06-06 MED ORDER — OXYCODONE HCL 5 MG PO TABS
5.0000 mg | ORAL_TABLET | Freq: Once | ORAL | Status: AC
  Administered 2022-06-06: 5 mg via ORAL

## 2022-06-06 MED ORDER — CEFAZOLIN SODIUM-DEXTROSE 2-4 GM/100ML-% IV SOLN
2.0000 g | INTRAVENOUS | Status: AC
  Administered 2022-06-06: 2 g via INTRAVENOUS

## 2022-06-06 MED ORDER — CEFAZOLIN SODIUM-DEXTROSE 1-4 GM/50ML-% IV SOLN
1.0000 g | Freq: Four times a day (QID) | INTRAVENOUS | Status: AC
  Administered 2022-06-06 – 2022-06-07 (×2): 1 g via INTRAVENOUS
  Filled 2022-06-06 (×2): qty 50

## 2022-06-06 MED ORDER — KETAMINE HCL 10 MG/ML IJ SOLN
INTRAMUSCULAR | Status: DC | PRN
  Administered 2022-06-06: 40 mg via INTRAVENOUS

## 2022-06-06 MED ORDER — HYDROMORPHONE HCL 1 MG/ML IJ SOLN
0.5000 mg | INTRAMUSCULAR | Status: DC | PRN
  Administered 2022-06-06: 1 mg via INTRAVENOUS
  Administered 2022-06-06: 0.5 mg via INTRAVENOUS
  Administered 2022-06-07 – 2022-06-08 (×3): 1 mg via INTRAVENOUS
  Filled 2022-06-06 (×5): qty 1

## 2022-06-06 MED ORDER — POLYETHYLENE GLYCOL 3350 17 G PO PACK: 17.0000 g | PACK | Freq: Every day | ORAL | Status: DC | PRN

## 2022-06-06 MED ORDER — 0.9 % SODIUM CHLORIDE (POUR BTL) OPTIME
TOPICAL | Status: DC | PRN
  Administered 2022-06-06: 1000 mL

## 2022-06-06 MED ORDER — PHENOL 1.4 % MT LIQD
1.0000 | OROMUCOSAL | Status: DC | PRN
  Administered 2022-06-09: 1 via OROMUCOSAL
  Filled 2022-06-06: qty 177

## 2022-06-06 MED ORDER — ASPIRIN 81 MG PO CHEW
81.0000 mg | CHEWABLE_TABLET | Freq: Two times a day (BID) | ORAL | Status: DC
  Administered 2022-06-06 – 2022-06-09 (×6): 81 mg via ORAL
  Filled 2022-06-06 (×6): qty 1

## 2022-06-06 MED ORDER — ROPIVACAINE HCL 5 MG/ML IJ SOLN
INTRAMUSCULAR | Status: DC | PRN
  Administered 2022-06-06: 30 mL via PERINEURAL

## 2022-06-06 MED ORDER — PANTOPRAZOLE SODIUM 40 MG PO TBEC
40.0000 mg | DELAYED_RELEASE_TABLET | Freq: Every day | ORAL | Status: DC
  Administered 2022-06-06 – 2022-06-09 (×4): 40 mg via ORAL
  Filled 2022-06-06 (×4): qty 1

## 2022-06-06 MED ORDER — ORAL CARE MOUTH RINSE: 15.0000 mL | Freq: Once | OROMUCOSAL | Status: AC

## 2022-06-06 MED ORDER — ONDANSETRON HCL 4 MG/2ML IJ SOLN
INTRAMUSCULAR | Status: DC | PRN
  Administered 2022-06-06: 4 mg via INTRAVENOUS

## 2022-06-06 MED ORDER — METOCLOPRAMIDE HCL 5 MG/ML IJ SOLN: 5.0000 mg | Freq: Three times a day (TID) | INTRAMUSCULAR | Status: DC | PRN

## 2022-06-06 MED ORDER — METHOCARBAMOL 1000 MG/10ML IJ SOLN: 500.0000 mg | Freq: Four times a day (QID) | INTRAVENOUS | Status: DC | PRN

## 2022-06-06 MED ORDER — PHENYLEPHRINE 80 MCG/ML (10ML) SYRINGE FOR IV PUSH (FOR BLOOD PRESSURE SUPPORT)
PREFILLED_SYRINGE | INTRAVENOUS | Status: AC
  Filled 2022-06-06: qty 10

## 2022-06-06 MED ORDER — TRANEXAMIC ACID-NACL 1000-0.7 MG/100ML-% IV SOLN
INTRAVENOUS | Status: AC
  Filled 2022-06-06: qty 100

## 2022-06-06 MED ORDER — FENTANYL CITRATE (PF) 250 MCG/5ML IJ SOLN
INTRAMUSCULAR | Status: AC
  Filled 2022-06-06: qty 5

## 2022-06-06 MED ORDER — BUPIVACAINE-EPINEPHRINE (PF) 0.25% -1:200000 IJ SOLN
INTRAMUSCULAR | Status: AC
  Filled 2022-06-06: qty 30

## 2022-06-06 MED ORDER — LOSARTAN POTASSIUM 50 MG PO TABS
50.0000 mg | ORAL_TABLET | Freq: Every day | ORAL | Status: DC
  Administered 2022-06-06 – 2022-06-09 (×4): 50 mg via ORAL
  Filled 2022-06-06 (×4): qty 1

## 2022-06-06 MED ORDER — SODIUM CHLORIDE 0.9 % IV SOLN: INTRAVENOUS | Status: DC

## 2022-06-06 MED ORDER — DEXAMETHASONE SODIUM PHOSPHATE 10 MG/ML IJ SOLN
INTRAMUSCULAR | Status: DC | PRN
  Administered 2022-06-06: 10 mg

## 2022-06-06 MED ORDER — OXYCODONE HCL 5 MG PO TABS
ORAL_TABLET | ORAL | Status: AC
  Filled 2022-06-06: qty 1

## 2022-06-06 MED ORDER — DIPHENHYDRAMINE HCL 12.5 MG/5ML PO ELIX
12.5000 mg | ORAL_SOLUTION | ORAL | Status: DC | PRN
  Administered 2022-06-06: 25 mg via ORAL
  Filled 2022-06-06: qty 10

## 2022-06-06 MED ORDER — FENTANYL CITRATE (PF) 250 MCG/5ML IJ SOLN
INTRAMUSCULAR | Status: DC | PRN
  Administered 2022-06-06 (×4): 25 ug via INTRAVENOUS
  Administered 2022-06-06 (×2): 50 ug via INTRAVENOUS
  Administered 2022-06-06 (×2): 25 ug via INTRAVENOUS
  Administered 2022-06-06: 50 ug via INTRAVENOUS
  Administered 2022-06-06 (×3): 25 ug via INTRAVENOUS
  Administered 2022-06-06: 100 ug via INTRAVENOUS
  Administered 2022-06-06: 25 ug via INTRAVENOUS

## 2022-06-06 MED ORDER — GABAPENTIN 300 MG PO CAPS
600.0000 mg | ORAL_CAPSULE | Freq: Every day | ORAL | Status: DC
  Administered 2022-06-06 – 2022-06-08 (×3): 600 mg via ORAL
  Filled 2022-06-06 (×3): qty 2

## 2022-06-06 MED ORDER — ACETAMINOPHEN 10 MG/ML IV SOLN: 1000.0000 mg | Freq: Once | INTRAVENOUS | Status: DC | PRN

## 2022-06-06 MED ORDER — ACETAMINOPHEN 325 MG PO TABS: 325.0000 mg | ORAL_TABLET | Freq: Four times a day (QID) | ORAL | Status: DC | PRN

## 2022-06-06 MED ORDER — ROSUVASTATIN CALCIUM 20 MG PO TABS
40.0000 mg | ORAL_TABLET | Freq: Every day | ORAL | Status: DC
  Administered 2022-06-07 – 2022-06-09 (×3): 40 mg via ORAL
  Filled 2022-06-06 (×3): qty 2

## 2022-06-06 MED ORDER — OXYCODONE HCL 5 MG PO TABS
5.0000 mg | ORAL_TABLET | ORAL | Status: DC | PRN
  Administered 2022-06-06: 10 mg via ORAL
  Filled 2022-06-06: qty 2

## 2022-06-06 MED ORDER — PROPOFOL 10 MG/ML IV BOLUS
INTRAVENOUS | Status: AC
  Filled 2022-06-06: qty 20

## 2022-06-06 MED ORDER — MIDAZOLAM HCL 5 MG/5ML IJ SOLN
INTRAMUSCULAR | Status: DC | PRN
  Administered 2022-06-06: .5 mg via INTRAVENOUS

## 2022-06-06 MED ORDER — LACTATED RINGERS IV SOLN: INTRAVENOUS | Status: DC

## 2022-06-06 MED ORDER — CEFAZOLIN SODIUM-DEXTROSE 2-4 GM/100ML-% IV SOLN
INTRAVENOUS | Status: AC
  Filled 2022-06-06: qty 100

## 2022-06-06 SURGICAL SUPPLY — 71 items
BAG COUNTER SPONGE SURGICOUNT (BAG) ×1 IMPLANT
BAG SPNG CNTER NS LX DISP (BAG) ×1
BANDAGE ESMARK 6X9 LF (GAUZE/BANDAGES/DRESSINGS) ×1 IMPLANT
BLADE SAG 18X100X1.27 (BLADE) ×1 IMPLANT
BNDG CMPR 9X6 STRL LF SNTH (GAUZE/BANDAGES/DRESSINGS) ×1
BNDG ELASTIC 6X5.8 VLCR STR LF (GAUZE/BANDAGES/DRESSINGS) ×2 IMPLANT
BNDG ESMARK 6X9 LF (GAUZE/BANDAGES/DRESSINGS) ×1
BOWL SMART MIX CTS (DISPOSABLE) IMPLANT
CEMENT BONE R 1X40 (Cement) IMPLANT
COMP TIB CMT PS D 0D RT (Joint) ×1 IMPLANT
COMPONENT TIB CMT PS D 0D RT (Joint) IMPLANT
COOLER ICEMAN CLASSIC (MISCELLANEOUS) ×1 IMPLANT
COVER SURGICAL LIGHT HANDLE (MISCELLANEOUS) ×1 IMPLANT
CUFF TOURN SGL QUICK 34 (TOURNIQUET CUFF) ×1
CUFF TOURN SGL QUICK 42 (TOURNIQUET CUFF) IMPLANT
CUFF TRNQT CYL 34X4.125X (TOURNIQUET CUFF) ×1 IMPLANT
DRAPE EXTREMITY T 121X128X90 (DISPOSABLE) ×1 IMPLANT
DRAPE HALF SHEET 40X57 (DRAPES) ×1 IMPLANT
DRAPE U-SHAPE 47X51 STRL (DRAPES) ×1 IMPLANT
DURAPREP 26ML APPLICATOR (WOUND CARE) ×1 IMPLANT
ELECT CAUTERY BLADE 6.4 (BLADE) ×1 IMPLANT
ELECT REM PT RETURN 9FT ADLT (ELECTROSURGICAL) ×1
ELECTRODE REM PT RTRN 9FT ADLT (ELECTROSURGICAL) ×1 IMPLANT
FACESHIELD WRAPAROUND (MASK) ×2 IMPLANT
FACESHIELD WRAPAROUND OR TEAM (MASK) ×2 IMPLANT
FEMUR CMT CR STD SZ 8 RT KNEE (Joint) ×1 IMPLANT
FEMUR CMTD CR STD SZ 8 RT KNEE (Joint) IMPLANT
GAUZE PAD ABD 8X10 STRL (GAUZE/BANDAGES/DRESSINGS) ×1 IMPLANT
GAUZE SPONGE 4X4 12PLY STRL (GAUZE/BANDAGES/DRESSINGS) ×1 IMPLANT
GAUZE XEROFORM 1X8 LF (GAUZE/BANDAGES/DRESSINGS) ×1 IMPLANT
GLOVE BIOGEL PI IND STRL 8 (GLOVE) ×2 IMPLANT
GLOVE ORTHO TXT STRL SZ7.5 (GLOVE) ×1 IMPLANT
GLOVE SURG ORTHO 8.0 STRL STRW (GLOVE) ×1 IMPLANT
GOWN STRL REUS W/ TWL LRG LVL3 (GOWN DISPOSABLE) IMPLANT
GOWN STRL REUS W/ TWL XL LVL3 (GOWN DISPOSABLE) ×2 IMPLANT
GOWN STRL REUS W/TWL LRG LVL3 (GOWN DISPOSABLE)
GOWN STRL REUS W/TWL XL LVL3 (GOWN DISPOSABLE) ×2
HANDPIECE INTERPULSE COAX TIP (DISPOSABLE) ×1
IMMOBILIZER KNEE 22 UNIV (SOFTGOODS) ×1 IMPLANT
INSERT TIB PERS SZ 8-9 10 RT (Insert) IMPLANT
IV NS 1000ML (IV SOLUTION) ×1
IV NS 1000ML BAXH (IV SOLUTION) ×1 IMPLANT
KIT BASIN OR (CUSTOM PROCEDURE TRAY) ×1 IMPLANT
KIT TURNOVER KIT B (KITS) ×1 IMPLANT
MANIFOLD NEPTUNE II (INSTRUMENTS) ×1 IMPLANT
NDL 18GX1X1/2 (RX/OR ONLY) (NEEDLE) IMPLANT
NEEDLE 18GX1X1/2 (RX/OR ONLY) (NEEDLE) IMPLANT
NS IRRIG 1000ML POUR BTL (IV SOLUTION) ×1 IMPLANT
PACK TOTAL JOINT (CUSTOM PROCEDURE TRAY) ×1 IMPLANT
PAD ABD 8X10 STRL (GAUZE/BANDAGES/DRESSINGS) IMPLANT
PAD ARMBOARD 7.5X6 YLW CONV (MISCELLANEOUS) ×1 IMPLANT
PAD COLD SHLDR WRAP-ON (PAD) ×1 IMPLANT
PADDING CAST COTTON 6X4 STRL (CAST SUPPLIES) ×1 IMPLANT
PIN DRILL HDLS TROCAR 75 4PK (PIN) IMPLANT
SCREW HEADED 33MM KNEE (MISCELLANEOUS) IMPLANT
SET HNDPC FAN SPRY TIP SCT (DISPOSABLE) ×1 IMPLANT
SET PAD KNEE POSITIONER (MISCELLANEOUS) ×1 IMPLANT
STAPLER VISISTAT 35W (STAPLE) ×1 IMPLANT
STEM POLY PAT PLY 29M KNEE (Knees) IMPLANT
SUCTION FRAZIER HANDLE 10FR (MISCELLANEOUS) ×1
SUCTION TUBE FRAZIER 10FR DISP (MISCELLANEOUS) ×1 IMPLANT
SUT VIC AB 0 CT1 27 (SUTURE) ×1
SUT VIC AB 0 CT1 27XBRD ANBCTR (SUTURE) ×1 IMPLANT
SUT VIC AB 1 CT1 27 (SUTURE) ×2
SUT VIC AB 1 CT1 27XBRD ANBCTR (SUTURE) ×2 IMPLANT
SUT VIC AB 2-0 CT1 27 (SUTURE) ×2
SUT VIC AB 2-0 CT1 TAPERPNT 27 (SUTURE) ×2 IMPLANT
SYR 50ML LL SCALE MARK (SYRINGE) IMPLANT
TOWEL GREEN STERILE (TOWEL DISPOSABLE) ×1 IMPLANT
TOWEL GREEN STERILE FF (TOWEL DISPOSABLE) ×1 IMPLANT
TRAY CATH INTERMITTENT SS 16FR (CATHETERS) IMPLANT

## 2022-06-06 NOTE — Evaluation (Signed)
Physical Therapy Evaluation Patient Details Name: [name] MRN: [id number] DOB: 30-Jul-1945 Today's Date: 06/06/2022  History of Present Illness  77 y.o. female presents to [facility] hospital on 06/06/2022 for elective R TKA. PMH includes HTN, depression, congenital musculoskeletal deformity of spine, OSA.  Clinical Impression  Pt presents to PT with deficits in endurance, strength, power, ROM, gait, balance. Pt is able to stand and ambulate for short distances with support of RW. PT provides education on TKR exercise packet. Pt will benefit from further gait and stair training prior to potential discharge.       Recommendations for follow up therapy are one component of a multi-disciplinary discharge planning process, led by the attending physician.  Recommendations may be updated based on patient status, additional functional criteria and insurance authorization.  Follow Up Recommendations       Assistance Recommended at Discharge PRN  Patient can return home with the following  A little help with bathing/dressing/bathroom;Assistance with cooking/housework;Assist for transportation;Help with stairs or ramp for entrance    Equipment Recommendations Rolling walker (2 wheels);BSC/3in1 (need to confirm what DME pt has)  Recommendations for Other Services       Functional Status Assessment Patient has had a recent decline in their functional status and demonstrates the ability to make significant improvements in function in a reasonable and predictable amount of time.     Precautions / Restrictions Precautions Precautions: Fall;Knee Precaution Booklet Issued: Yes (comment) Restrictions Weight Bearing Restrictions: Yes RLE Weight Bearing: Weight bearing as tolerated      Mobility  Bed Mobility Overal bed mobility: Needs Assistance Bed Mobility: Supine to Sit, Sit to Supine     Supine to sit: Min assist, HOB elevated Sit to supine: Min assist        Transfers Overall  transfer level: Needs assistance Equipment used: Rolling walker (2 wheels) Transfers: Sit to/from Stand Sit to Stand: Min guard, From elevated surface                Ambulation/Gait Ambulation/Gait assistance: Min guard Gait Distance (Feet): 12 Feet Assistive device: Rolling walker (2 wheels) Gait Pattern/deviations: Step-to pattern Gait velocity: reduced Gait velocity interpretation: <1.31 ft/sec, indicative of household ambulator   General Gait Details: slowed step-to gait, reduced step length  Stairs            Wheelchair Mobility    Modified Rankin (Stroke Patients Only)       Balance Overall balance assessment: Needs assistance Sitting-balance support: No upper extremity supported, Feet supported Sitting balance-Leahy Scale: Good     Standing balance support: Bilateral upper extremity supported, Reliant on assistive device for balance Standing balance-Leahy Scale: Poor                               Pertinent Vitals/Pain Pain Assessment Pain Assessment: Faces Faces Pain Scale: Hurts even more Pain Location: R knee incision Pain Descriptors / Indicators: Burning Pain Intervention(s): Monitored during session    Home Living Family/patient expects to be discharged to:: Private residence Living Arrangements: Spouse/significant other Available Help at Discharge: Family;Available 24 hours/day Type of Home: House Home Access: Stairs to enter Entrance Stairs-Rails: None Entrance Stairs-Number of Steps: 3   Home Layout: One level Home Equipment:  (need to confirm DME with patient)      Prior Function Prior Level of Function : Independent/Modified Independent;Driving  Hand Dominance        Extremity/Trunk Assessment   Upper Extremity Assessment Upper Extremity Assessment: Overall WFL for tasks assessed    Lower Extremity Assessment Lower Extremity Assessment: RLE deficits/detail RLE Deficits / Details:  post-op knee ROM deficits as expected. ankle PF/DF 3/5, knee extension 2/5 RLE Sensation: decreased light touch    Cervical / Trunk Assessment Cervical / Trunk Assessment: Other exceptions (history of scoliosis)  Communication   Communication: No difficulties  Cognition Arousal/Alertness: Awake/alert Behavior During Therapy: WFL for tasks assessed/performed Overall Cognitive Status: Within Functional Limits for tasks assessed                                          General Comments General comments (skin integrity, edema, etc.): VSS on RA    Exercises Other Exercises Other Exercises: PT provides TKR exercise packet, verbally reviewed with patient   Assessment/Plan    PT Assessment Patient needs continued PT services  PT Problem List Decreased strength;Decreased activity tolerance;Decreased balance;Decreased mobility;Decreased knowledge of use of DME;Pain       PT Treatment Interventions DME instruction;Gait training;Stair training;Functional mobility training;Therapeutic activities;Therapeutic exercise;Balance training;Neuromuscular re-education;Patient/family education    PT Goals (Current goals can be found in the Care Plan section)  Acute Rehab PT Goals Patient Stated Goal: to return to independence PT Goal Formulation: With patient Time For Goal Achievement: 06/10/22 Potential to Achieve Goals: Good    Frequency 7X/week     Co-evaluation               AM-PAC PT "6 Clicks" Mobility  Outcome Measure Help needed turning from your back to your side while in a flat bed without using bedrails?: A Little Help needed moving from lying on your back to sitting on the side of a flat bed without using bedrails?: A Little Help needed moving to and from a bed to a chair (including a wheelchair)?: A Little Help needed standing up from a chair using your arms (e.g., wheelchair or bedside chair)?: A Little Help needed to walk in hospital room?: A  Little Help needed climbing 3-5 steps with a railing? : Total 6 Click Score: 16    End of Session   Activity Tolerance: Patient tolerated treatment well Patient left: in bed;with call bell/phone within reach;with bed alarm set Nurse Communication: Mobility status PT Visit Diagnosis: Other abnormalities of gait and mobility (R26.89);Muscle weakness (generalized) (M62.81)    Time: 1610-9604 PT Time Calculation (min) (ACUTE ONLY): 24 min   Charges:   PT Evaluation $PT Eval Low Complexity: 1 Low          Arlyss Gandy, PT, DPT Acute Rehabilitation Office [phone]   Arlyss Gandy 06/06/2022, 6:06 PM

## 2022-06-06 NOTE — Op Note (Signed)
Operative Note  Date of operation: 06/06/2022 Preoperative diagnosis: Right knee primary osteoarthritis Postoperative diagnosis: Same  Procedure: Right cemented total knee arthroplasty  Implants: Biomet/Zimmer persona knee system Implant Name Type Inv. Item Serial No. Manufacturer Lot No. LRB No. Used Action  CEMENT BONE R 1X40 - [id number] Cement CEMENT BONE R 1X40  ZIMMER RECON(ORTH,TRAU,BIO,SG) [id number] Right 2 Implanted  STEM POLY PAT PLY 70M KNEE - [id number] Knees STEM POLY PAT PLY 70M KNEE  ZIMMER RECON(ORTH,TRAU,BIO,SG) [id number] Right 1 Implanted  INSERT TIB PERS SZ 8-9 10 RT - [id number] Insert INSERT TIB PERS SZ 8-9 10 RT  ZIMMER RECON(ORTH,TRAU,BIO,SG) [id number] Right 1 Implanted  COMP TIB CMT PS D 0D RT - [id number] Joint COMP TIB CMT PS D 0D RT  ZIMMER RECON(ORTH,TRAU,BIO,SG) [id number] Right 1 Implanted  FEMUR CMT CR STD SZ 8 RT KNEE - [id number] Joint FEMUR CMT CR STD SZ 8 RT KNEE  ZIMMER RECON(ORTH,TRAU,BIO,SG) [id number] Right 1 Implanted   Surgeon: Vanita Panda. Magnus Ivan, MD Assistant: Rexene Edison, PA-C  Anesthesia: #1 right lower extremity adductor canal block, #2 General, #3 local Tourniquet time: 1 hour EBL: Less than 50 cc Antibiotics: IV Ancef Complications: None  Indications: The patient is a 77 year old female with well-documented severe arthritis of her right knee.  She has tried and failed conservative treatment for over a year now including anti-inflammatories, activity modification, steroid injections and hyaluronic acid injections.  At this point her right knee pain is daily and it is detrimentally affecting her mobility, her quality of life, and her actives daily living to the point she does wish to proceed with a knee replacement and we have recommended this as well.  We did have a long thorough discussion about the risk of acute blood loss anemia, nerve or vessel injury, fracture, infection, DVT, implant failure and wound healing issues.  She understands her  goals are hopefully decrease pain, improve mobility, and improve quality of life.  Procedure description: After informed consent was obtained and the appropriate right knee was marked, anesthesia obtained a right lower extremity adductor canal block in the holding room.  The patient was then brought to the operating room and placed supine on the operating table where general anesthesia was obtained.  A nonsterile tourniquet was then placed around her upper right thigh and her right thigh, knee, leg, ankle and foot were prepped and draped with DuraPrep and sterile drapes including a sterile stockinette.  A timeout was called and she was identified as the correct patient the correct right knee.  An Esmarch was then used to wrap out the leg and the tourniquet was plated to 300 mm of pressure.  With the knee extended we then made a midline incision over the patella and carried this proximally distally.  Dissection was carried down to the knee joint and a medial parapatellar arthrotomy was made.  There was a large joint effusion encountered.  With the knee flexed we removed osteophytes in all 3 compartments and found significant cartilage wear of the lateral compartment the knee as well as significant enough medial and the patellofemoral joint.  Remove remnants of the ACL as well as medial lateral meniscus.  We then used an extramedullary cutting guide to make our proximal tibia cut correction for varus and valgus and a 7 degree slope.  We made this cut to take 2 mm off the high side and then we backed it down to more millimeters.  We then used an intramedullary cutting guide for making her distal femoral cut  setting this for right knee at 5 degrees externally rotated and a 10 mm distal femoral cut.  We made that cut without difficulty and brought it down to more millimeters as well.  With a 10 mm extension block we did achieve full extension.  We then go back to the femur and used a femoral sizing guide based off the  epicondylar axis.  Based off of this we chose a size 8 femur.  We put a 4-in-1 cutting block for size 8 femur and made our anterior and posterior cuts followed by our chamfer cuts.  We then went back to the tibia and chose a size D tibial tray for right knee making sure there was good coverage over the tibial plateau setting the rotation of the tibial tubercle and the femur.  We then did our drill hole and keel punch off of this.  We then trialed our size D right tibial tray followed by our size 8 right CR standard femur.  We trialed a medial congruent right 10 mm thickness polythene insert.  We were pleased with range of motion and stability without insert.  We then made our patella cut and drilled 3 holes for size 29 patella button.  With all trial instrumentation in the knee we put the knee through several cycles of motion and again we are pleased with range of motion and stability.  We then removed all trial instrumentation from the knee.  We irrigated the knee with normal saline solution and then placed our Marcaine with epinephrine around the arthrotomy.  The cement was then mixed and we dried the knee roll well.  We held the knee in a flexed position while we cemented our Biomet Zimmer persona tibial tray for right knee size D followed by cementing our size 8 right CR standard femur.  We placed our 10 mm thickness right medial congruent polythene insert and cemented our size 29 patella button.  We held the knee fully extended and compressed while the cement hardened.  Once the cemented hardened we put the knee again through range of motion and we are pleased.  The tourniquet was then let down and hemostasis was obtained with electrocautery.  The arthrotomy was closed with interrupted #1 Vicryl suture followed by 0 Vicryl close the deep tissue and 2-0 Vicryl to close the subcutaneous tissue.  The skin was closed with staples.  Well-padded sterile dressings applied.  The patient was awakened, extubated and  taken recovery in stable condition.  Rexene Edison, PA-C did assist during the entire case and beginning to end and his assistance was crucial and medically necessary for soft tissue management and retraction, helping guide implant placement and a layered closure of the wound.

## 2022-06-06 NOTE — Transfer of Care (Signed)
Immediate Anesthesia Transfer of Care Note  Patient: [name]  Procedure(s) Performed: RIGHT TOTAL KNEE ARTHROPLASTY (Right: Knee)  Patient Location: PACU  Anesthesia Type:General  Level of Consciousness: awake, patient cooperative, and responds to stimulation  Airway & Oxygen Therapy: Patient Spontanous Breathing and Patient connected to nasal cannula oxygen  Post-op Assessment: Report given to RN and Post -op Vital signs reviewed and stable  Post vital signs: Reviewed and stable  Last Vitals:  Vitals Value Taken Time  BP 148/89 06/06/22 1515  Temp    Pulse 89 06/06/22 1515  Resp 20 06/06/22 1515  SpO2 97 % 06/06/22 1515  Vitals shown include unvalidated device data.  Last Pain:  Vitals:   06/06/22 1026  TempSrc:   PainSc: 2          Complications: No notable events documented.

## 2022-06-06 NOTE — Anesthesia Procedure Notes (Signed)
Anesthesia Regional Block: Adductor canal block   Pre-Anesthetic Checklist: , timeout performed,  Correct Patient, Correct Site, Correct Laterality,  Correct Procedure, Correct Position, site marked,  Risks and benefits discussed,  Surgical consent,  Pre-op evaluation,  At surgeon's request and post-op pain management  Laterality: Right  Prep: Dura Prep       Needles:  Injection technique: Single-shot  Needle Type: Echogenic Stimulator Needle     Needle Length: 10cm  Needle Gauge: 20     Additional Needles:   Procedures:,,,, ultrasound used (permanent image in chart),,    Narrative:  Start time: 06/06/2022 1:00 PM End time: 06/06/2022 1:05 PM Injection made incrementally with aspirations every 5 mL.  Performed by: Personally  Anesthesiologist: Atilano Median, DO  Additional Notes: Patient identified. Risks/Benefits/Options discussed with patient including but not limited to bleeding, infection, nerve damage, failed block, incomplete pain control. Patient expressed understanding and wished to proceed. All questions were answered. Sterile technique was used throughout the entire procedure. Please see nursing notes for vital signs. Aspirated in 5cc intervals with injection for negative confirmation. Patient was given instructions on fall risk and not to get out of bed. All questions and concerns addressed with instructions to call with any issues or inadequate analgesia.

## 2022-06-06 NOTE — Anesthesia Preprocedure Evaluation (Signed)
Anesthesia Evaluation  Patient identified by MRN, date of birth, ID band Patient awake    Reviewed: Allergy & Precautions, NPO status , Patient's Chart, lab work & pertinent test results  Airway Mallampati: II  TM Distance: >3 FB Neck ROM: Full    Dental no notable dental hx.    Pulmonary sleep apnea , former smoker   Pulmonary exam normal        Cardiovascular hypertension, Pt. on medications  Rhythm:Regular Rate:Normal     Neuro/Psych  Headaches   Depression       GI/Hepatic negative GI ROS, Neg liver ROS,,,  Endo/Other  negative endocrine ROS    Renal/GU negative Renal ROS  negative genitourinary   Musculoskeletal  (+) Arthritis , Osteoarthritis,    Abdominal Normal abdominal exam  (+)   Peds  Hematology  (+) Blood dyscrasia, anemia   Anesthesia Other Findings   Reproductive/Obstetrics                             Anesthesia Physical Anesthesia Plan  ASA: 3  Anesthesia Plan: General and Regional   Post-op Pain Management: Regional block*   Induction: Intravenous  PONV Risk Score and Plan: 3 and Ondansetron, Dexamethasone, Midazolam and Treatment may vary due to age or medical condition  Airway Management Planned: Mask and LMA  Additional Equipment: None  Intra-op Plan:   Post-operative Plan: Extubation in OR  Informed Consent: I have reviewed the patients History and Physical, chart, labs and discussed the procedure including the risks, benefits and alternatives for the proposed anesthesia with the patient or authorized representative who has indicated his/her understanding and acceptance.     Dental advisory given  Plan Discussed with: CRNA  Anesthesia Plan Comments: (- patient not interested in spinal anesthetic given multiple back surgeries. Will plan for GA with regional block for post-operative analgesia adjuvant.)       Anesthesia Quick Evaluation

## 2022-06-06 NOTE — Anesthesia Procedure Notes (Signed)
Procedure Name: LMA Insertion Date/Time: 06/06/2022 1:28 PM  Performed by: Ayesha Rumpf, CRNAPre-anesthesia Checklist: Patient identified, Emergency Drugs available, Suction available and Patient being monitored Patient Re-evaluated:Patient Re-evaluated prior to induction Oxygen Delivery Method: Circle System Utilized Preoxygenation: Pre-oxygenation with 100% oxygen Induction Type: IV induction Ventilation: Mask ventilation without difficulty LMA: LMA inserted LMA Size: 4.0 Number of attempts: 2 Airway Equipment and Method: Bite block Placement Confirmation: positive ETCO2 Tube secured with: Tape Dental Injury: Teeth and Oropharynx as per pre-operative assessment

## 2022-06-06 NOTE — Interval H&P Note (Signed)
History and Physical Interval Note: The patient understands that she is here today for a right knee replacement to treat her severe right knee arthritis.  There has been no acute or interval change in her medical status.  The risks and benefits of surgery have been discussed and explained in detail and informed consent has been obtained.  The right operative knee has been marked.  06/06/2022 11:50 AM  [name]  has presented today for surgery, with the diagnosis of OSTEOARTHRITIS / DEGENERATIVE JOINT DISEASE RIGHT KNEE.  The various methods of treatment have been discussed with the patient and family. After consideration of risks, benefits and other options for treatment, the patient has consented to  Procedure(s): RIGHT TOTAL KNEE ARTHROPLASTY (Right) as a surgical intervention.  The patient's history has been reviewed, patient examined, no change in status, stable for surgery.  I have reviewed the patient's chart and labs.  Questions were answered to the patient's satisfaction.     Kathryne Hitch

## 2022-06-07 ENCOUNTER — Encounter (HOSPITAL_COMMUNITY): Payer: Self-pay | Admitting: Orthopaedic Surgery

## 2022-06-07 DIAGNOSIS — I10 Essential (primary) hypertension: Secondary | ICD-10-CM | POA: Diagnosis present

## 2022-06-07 DIAGNOSIS — Z79899 Other long term (current) drug therapy: Secondary | ICD-10-CM | POA: Diagnosis not present

## 2022-06-07 DIAGNOSIS — Z825 Family history of asthma and other chronic lower respiratory diseases: Secondary | ICD-10-CM | POA: Diagnosis not present

## 2022-06-07 DIAGNOSIS — M1711 Unilateral primary osteoarthritis, right knee: Secondary | ICD-10-CM | POA: Diagnosis present

## 2022-06-07 DIAGNOSIS — Z823 Family history of stroke: Secondary | ICD-10-CM | POA: Diagnosis not present

## 2022-06-07 DIAGNOSIS — Z87891 Personal history of nicotine dependence: Secondary | ICD-10-CM | POA: Diagnosis not present

## 2022-06-07 DIAGNOSIS — Z96643 Presence of artificial hip joint, bilateral: Secondary | ICD-10-CM | POA: Diagnosis present

## 2022-06-07 DIAGNOSIS — Z818 Family history of other mental and behavioral disorders: Secondary | ICD-10-CM | POA: Diagnosis not present

## 2022-06-07 LAB — BASIC METABOLIC PANEL
Anion gap: 8 (ref 5–15)
BUN: 9 mg/dL (ref 8–23)
CO2: 23 mmol/L (ref 22–32)
Calcium: 8.4 mg/dL — ABNORMAL LOW (ref 8.9–10.3)
Chloride: 103 mmol/L (ref 98–111)
Creatinine, Ser: 0.71 mg/dL (ref 0.44–1.00)
GFR, Estimated: >60 mL/min (ref >60)
Glucose, Bld: 140 mg/dL — ABNORMAL HIGH (ref 70–99)
Potassium: 4.1 mmol/L (ref 3.5–5.1)
Sodium: 134 mmol/L — ABNORMAL LOW (ref 135–145)

## 2022-06-07 LAB — CBC
HCT: 36 % (ref 36.0–46.0)
Hemoglobin: 11.7 g/dL — ABNORMAL LOW (ref 12.0–15.0)
MCH: 29.1 pg (ref 26.0–34.0)
MCHC: 32.5 g/dL (ref 30.0–36.0)
MCV: 89.6 fL (ref 80.0–100.0)
Platelets: 246 10*3/uL (ref 150–400)
RBC: 4.02 MIL/uL (ref 3.87–5.11)
RDW: 12.8 % (ref 11.5–15.5)
WBC: 17.7 10*3/uL — ABNORMAL HIGH (ref 4.0–10.5)
nRBC: 0 % (ref 0.0–0.2)

## 2022-06-07 MED ORDER — KETOROLAC TROMETHAMINE 15 MG/ML IJ SOLN
7.5000 mg | Freq: Four times a day (QID) | INTRAMUSCULAR | Status: AC
  Administered 2022-06-07 – 2022-06-08 (×3): 7.5 mg via INTRAVENOUS
  Filled 2022-06-07 (×3): qty 1

## 2022-06-07 NOTE — Discharge Instructions (Signed)

## 2022-06-07 NOTE — Anesthesia Postprocedure Evaluation (Signed)
Anesthesia Post Note  Patient: [name]  Procedure(s) Performed: RIGHT TOTAL KNEE ARTHROPLASTY (Right: Knee)     Patient location during evaluation: PACU Anesthesia Type: Regional and General Level of consciousness: awake and alert Pain management: pain level controlled Vital Signs Assessment: post-procedure vital signs reviewed and stable Respiratory status: spontaneous breathing, nonlabored ventilation, respiratory function stable and patient connected to nasal cannula oxygen Cardiovascular status: blood pressure returned to baseline and stable Postop Assessment: no apparent nausea or vomiting Anesthetic complications: no   No notable events documented.  Last Vitals:  Vitals:   06/07/22 0331 06/07/22 0742  BP: (!) 149/90 128/75  Pulse: 85 75  Resp: 17 18  Temp: 36.5 C 36.6 C  SpO2: 98% 98%    Last Pain:  Vitals:   06/07/22 1006  TempSrc:   PainSc: 6                  Ilijah Doucet P Ayub Kirsh

## 2022-06-07 NOTE — Progress Notes (Signed)
Physical Therapy Treatment Patient Details Name: [name] MRN: [id number] DOB: 1945/07/28 Today's Date: 06/07/2022   History of Present Illness 77 y.o. female presents to [facility] hospital on 06/06/2022 for elective R TKA. PMH includes HTN, depression, congenital musculoskeletal deformity of spine, OSA.    PT Comments    Pt seen for PT tx with pt agreeable. Pt received in bed, transitions to sitting EOB with supervision with HOB slightly elevated & bed rails. Pt is able to complete STS with supervision & ambulate around bed to recliner with RW & supervision; pt politely declines further gait. Pt performs RLE strengthening & ROM exercises from chair with education re: technique. Pt motivated to participate but reports she's limited by 8/10 pain at this time.    Recommendations for follow up therapy are one component of a multi-disciplinary discharge planning process, led by the attending physician.  Recommendations may be updated based on patient status, additional functional criteria and insurance authorization.  Follow Up Recommendations       Assistance Recommended at Discharge PRN  Patient can return home with the following A little help with bathing/dressing/bathroom;Assistance with cooking/housework;Assist for transportation;Help with stairs or ramp for entrance;A little help with walking and/or transfers   Equipment Recommendations  None recommended by PT    Recommendations for Other Services       Precautions / Restrictions Precautions Precautions: Fall;Knee Restrictions Weight Bearing Restrictions: Yes RLE Weight Bearing: Weight bearing as tolerated     Mobility  Bed Mobility Overal bed mobility: Needs Assistance Bed Mobility: Supine to Sit     Supine to sit: Supervision, HOB elevated Sit to supine: Supervision, HOB elevated   General bed mobility comments: use of bed rails, HOB slightly elevated    Transfers Overall transfer level: Needs  assistance Equipment used: Rolling walker (2 wheels) Transfers: Sit to/from Stand Sit to Stand: Supervision           General transfer comment: STS from EOB with extra time to recall correct hand placement    Ambulation/Gait Ambulation/Gait assistance: Supervision, Min guard Gait Distance (Feet): 10 Feet Assistive device: Rolling walker (2 wheels) Gait Pattern/deviations: Decreased step length - right, Decreased step length - left, Decreased stride length, Decreased dorsiflexion - right, Decreased dorsiflexion - left, Trunk flexed Gait velocity: decreased     General Gait Details: Cuing re: upright posture   Stairs Stairs: Yes Stairs assistance: Min assist Stair Management: No rails, With walker, Backwards Number of Stairs: 4 (6") General stair comments: PT provides education re: sequencing & pt negotiates 4 steps backwards with RW & min assist.   Wheelchair Mobility    Modified Rankin (Stroke Patients Only)       Balance Overall balance assessment: Needs assistance Sitting-balance support: No upper extremity supported, Feet supported Sitting balance-Leahy Scale: Good     Standing balance support: Bilateral upper extremity supported, During functional activity, Reliant on assistive device for balance Standing balance-Leahy Scale: Good                              Cognition Arousal/Alertness: Awake/alert Behavior During Therapy: WFL for tasks assessed/performed Overall Cognitive Status: Within Functional Limits for tasks assessed                                 General Comments: Appreciative of PT efforts        Exercises Total Joint Exercises  Heel Slides: AROM, Strengthening, Right, 20 reps Straight Leg Raises: AROM, Strengthening, Right, 20 reps Long Arc Quad: AROM, Seated, Strengthening, Right, 10 reps Knee Flexion: AAROM, AROM, Seated, Right, 20 reps (washcloth between foot & floor to reduce friction)    General Comments         Pertinent Vitals/Pain Pain Assessment Pain Assessment: 0-10 Pain Score: 8  Pain Location: R knee Pain Descriptors / Indicators: Discomfort Pain Intervention(s): Monitored during session, Repositioned    Home Living                          Prior Function            PT Goals (current goals can now be found in the care plan section) Acute Rehab PT Goals Patient Stated Goal: decreased pain, return to independence PT Goal Formulation: With patient Time For Goal Achievement: 06/10/22 Potential to Achieve Goals: Good Progress towards PT goals: Progressing toward goals    Frequency    7X/week      PT Plan Current plan remains appropriate    Co-evaluation              AM-PAC PT "6 Clicks" Mobility   Outcome Measure  Help needed turning from your back to your side while in a flat bed without using bedrails?: None Help needed moving from lying on your back to sitting on the side of a flat bed without using bedrails?: A Little Help needed moving to and from a bed to a chair (including a wheelchair)?: A Little Help needed standing up from a chair using your arms (e.g., wheelchair or bedside chair)?: A Little Help needed to walk in hospital room?: A Little Help needed climbing 3-5 steps with a railing? : A Little 6 Click Score: 19    End of Session Equipment Utilized During Treatment: Gait belt Activity Tolerance: Patient tolerated treatment well Patient left: in chair;with call bell/phone within reach Nurse Communication: Mobility status PT Visit Diagnosis: Other abnormalities of gait and mobility (R26.89);Muscle weakness (generalized) (M62.81);Pain;Difficulty in walking, not elsewhere classified (R26.2) Pain - Right/Left: Right Pain - part of body: Knee     Time: 1324-1350 PT Time Calculation (min) (ACUTE ONLY): 26 min  Charges:   $Therapeutic Exercise: 8-22 mins $Therapeutic Activity: 8-22 mins                     Aleda Grana, PT,  DPT 06/07/22, 2:03 PM   Sandi Mariscal 06/07/2022, 2:02 PM

## 2022-06-07 NOTE — TOC Initial Note (Addendum)
Transition of Care [facility]) - Initial/Assessment Note    Patient Details  Name: [name] MRN: [id number] Date of Birth: 1945-05-25  Transition [facility]) CM/SW Contact:    Epifanio Lesches, RN Phone Number: 06/07/2022, 2:24 PM  Clinical Narrative:                     - S/P R TKA , 5/28 From home with husband. PTA independent with ADL's, DME:RW, BSC, cane @ home. Husband to assist with care once d/c. Pt setup with [facility] by provider's office prior to surgery. Pt without transportation issues or RX med concerns. Post hospital f/u  noted on AVS.    TOC team following...  Expected Discharge Plan: Home w Home Health Services Barriers to Discharge: Continued Medical Work up   Patient Goals and CMS Choice     Choice offered to / list presented to : Patient      Expected Discharge Plan and Services   Discharge Planning Services: CM Consult   Living arrangements for the past 2 months: Single Family Home                                      Prior Living Arrangements/Services Living arrangements for the past 2 months: Single Family Home Lives with:: Spouse Patient language and need for interpreter reviewed:: Yes Do you feel safe going back to the place where you live?: Yes      Need for Family Participation in Patient Care: Yes (Comment) Care giver support system in place?: Yes (comment)   Criminal Activity/Legal Involvement Pertinent to Current Situation/Hospitalization: No - Comment as needed  Activities of Daily Living Home Assistive Devices/Equipment: Cane (specify quad or straight) ADL Screening (condition at time of admission) Patient's cognitive ability adequate to safely complete daily activities?: Yes Is the patient deaf or have difficulty hearing?: No Does the patient have difficulty seeing, even when wearing glasses/contacts?: No Does the patient have difficulty concentrating, remembering, or making decisions?: No Patient able to  express need for assistance with ADLs?: Yes Does the patient have difficulty dressing or bathing?: No Independently performs ADLs?: Yes (appropriate for developmental age) Does the patient have difficulty walking or climbing stairs?: Yes Weakness of Legs: Right Weakness of Arms/Hands: None  Permission Sought/Granted                  Emotional Assessment Appearance:: Appears stated age Attitude/Demeanor/Rapport: Engaged Affect (typically observed): Accepting Orientation: : Oriented to Self, Oriented to Place, Oriented to  Time, Oriented to Situation Alcohol / Substance Use: Not Applicable Psych Involvement: No (comment)  Admission diagnosis:  Status post total right knee replacement [Z96.651] Patient Active Problem List   Diagnosis Date Noted   Status post total right knee replacement 06/06/2022   Unilateral primary osteoarthritis, right knee 03/15/2022   Leg length discrepancy, Right 10/07/2020   History of revision of total replacement of right hip joint 10/07/2020   Multinodular goiter 05/18/2020   Hip contracture, unspecified laterality 07/02/2018   Overweight (BMI 25.0-29.9) 05/09/2017   S/P right THA, AA 05/08/2017   Chronic bilateral low back pain without sciatica 04/05/2015   Disorder of sacroiliac joint 12/22/2014   Chronic low back pain 12/22/2014   Acquired scoliosis 11/27/2011   Lumbosacral spondylosis without myelopathy 05/22/2011   PCP:  Jarrett Soho, PA-C Pharmacy:   [facility] [phone] - [place], [place] - [address]  [address] [place] [address] Phone: [phone] Fax: [phone]     Social Determinants of Health (SDOH) Social History: SDOH Screenings   Food Insecurity: No Food Insecurity (06/06/2022)  Housing: Patient Declined (06/06/2022)  Transportation Needs: Unknown (06/06/2022)  Utilities: Patient Declined (06/06/2022)  Depression (PHQ2-9): Low Risk  (09/26/2021)  Tobacco Use: Medium Risk (06/06/2022)    SDOH Interventions: Food Insecurity Interventions: Intervention Not Indicated Housing Interventions: Patient Declined Utilities Interventions: Patient Declined   Readmission Risk Interventions     No data to display

## 2022-06-07 NOTE — Progress Notes (Signed)
Patient ID: [name], female   DOB: August 23, 1945, 77 y.o.   MRN: [id number] Although the patient did quite well with therapy, she is having significant pain.  This will keep her here at least today.  I am trying a few doses of Toradol to see if this will help her.  Her husband is at the bedside.  The plan will be to potentially discharge her to home tomorrow versus Friday depending on her pain control and progress with therapy.  From a utilization review team standpoint, if they feel that it is appropriate for the patient to be transition to an inpatient admission, they have my permission to do so as a verbal order.

## 2022-06-07 NOTE — Progress Notes (Signed)
Physical Therapy Treatment Patient Details Name: [name] MRN: [id number] DOB: Mar 23, 1945 Today's Date: 06/07/2022   History of Present Illness 77 y.o. female presents to [facility] hospital on 06/06/2022 for elective R TKA. PMH includes HTN, depression, congenital musculoskeletal deformity of spine, OSA.    PT Comments    Pt seen for PT tx with pt agreeable. Pt is able to perform 10x RLE SLR from supine, and RLE LAQ x 10 with 3-/5 knee extension so knee immobilizer not utilized during session. Pt was able to increase gait distance to 100 ft x 2 with RW & CGA fade to supervision. Pt negotiates 4 steps with RW, backwards, with min assist with good balance & safety awareness. Pt has made good progress with functional mobility.    Recommendations for follow up therapy are one component of a multi-disciplinary discharge planning process, led by the attending physician.  Recommendations may be updated based on patient status, additional functional criteria and insurance authorization.  Follow Up Recommendations       Assistance Recommended at Discharge PRN  Patient can return home with the following A little help with bathing/dressing/bathroom;Assistance with cooking/housework;Assist for transportation;Help with stairs or ramp for entrance   Equipment Recommendations  None recommended by PT (pt reports she has a RW & BSC)    Recommendations for Other Services       Precautions / Restrictions Precautions Precautions: Fall;Knee Restrictions Weight Bearing Restrictions: Yes RLE Weight Bearing: Weight bearing as tolerated     Mobility  Bed Mobility Overal bed mobility: Needs Assistance Bed Mobility: Supine to Sit     Supine to sit: Supervision, HOB elevated Sit to supine: Supervision, HOB elevated   General bed mobility comments: use of bed rails, HOB elevated, extra time to upright trunk during supine>sit, extra time to elevate RLE onto bed during sit>supine     Transfers Overall transfer level: Needs assistance Equipment used: Rolling walker (2 wheels) Transfers: Sit to/from Stand Sit to Stand: Supervision           General transfer comment: education re: hand placement during STS from EOB with RW    Ambulation/Gait Ambulation/Gait assistance: Min guard, Supervision Gait Distance (Feet): 100 Feet (+ 100 ft) Assistive device: Rolling walker (2 wheels) Gait Pattern/deviations: Decreased step length - right, Decreased step length - left, Decreased stride length, Decreased dorsiflexion - right, Decreased dorsiflexion - left, Trunk flexed Gait velocity: decreased     General Gait Details: Education re: upright posture, ambulate within base of AD   Stairs Stairs: Yes Stairs assistance: Min assist Stair Management: No rails, With walker, Backwards Number of Stairs: 4 (6") General stair comments: PT provides education re: sequencing & pt negotiates 4 steps backwards with RW & min assist.   Wheelchair Mobility    Modified Rankin (Stroke Patients Only)       Balance Overall balance assessment: Needs assistance Sitting-balance support: No upper extremity supported, Feet supported Sitting balance-Leahy Scale: Good     Standing balance support: Bilateral upper extremity supported, During functional activity, Reliant on assistive device for balance Standing balance-Leahy Scale: Good                              Cognition Arousal/Alertness: Awake/alert Behavior During Therapy: WFL for tasks assessed/performed Overall Cognitive Status: Within Functional Limits for tasks assessed  Exercises Total Joint Exercises Straight Leg Raises: AROM, Strengthening, Right, 10 reps, Supine Long Arc Quad: AROM, Seated, Strengthening, Right, 10 reps    General Comments        Pertinent Vitals/Pain Pain Assessment Pain Assessment: 0-10 Pain Score: 8  (at end of  session) Pain Location: R knee Pain Descriptors / Indicators: Discomfort Pain Intervention(s): Premedicated before session, Repositioned, Monitored during session, Limited activity within patient's tolerance (polar care donned at beginning & end of session)    Home Living                          Prior Function            PT Goals (current goals can now be found in the care plan section) Acute Rehab PT Goals Patient Stated Goal: to return to independence PT Goal Formulation: With patient Time For Goal Achievement: 06/10/22 Potential to Achieve Goals: Good Progress towards PT goals: Progressing toward goals    Frequency    7X/week      PT Plan Current plan remains appropriate    Co-evaluation              AM-PAC PT "6 Clicks" Mobility   Outcome Measure  Help needed turning from your back to your side while in a flat bed without using bedrails?: None Help needed moving from lying on your back to sitting on the side of a flat bed without using bedrails?: A Little Help needed moving to and from a bed to a chair (including a wheelchair)?: A Little Help needed standing up from a chair using your arms (e.g., wheelchair or bedside chair)?: A Little Help needed to walk in hospital room?: A Little Help needed climbing 3-5 steps with a railing? : A Little 6 Click Score: 19    End of Session Equipment Utilized During Treatment: Gait belt Activity Tolerance: Patient tolerated treatment well Patient left: in bed;with call bell/phone within reach;with bed alarm set (polar care donned RLE) Nurse Communication: Mobility status PT Visit Diagnosis: Other abnormalities of gait and mobility (R26.89);Muscle weakness (generalized) (M62.81);Pain;Difficulty in walking, not elsewhere classified (R26.2) Pain - Right/Left: Right Pain - part of body: Knee     Time: 1610-9604 PT Time Calculation (min) (ACUTE ONLY): 22 min  Charges:  $Gait Training: 8-22 mins                      Aleda Grana, PT, DPT 06/07/22, 10:23 AM   Sandi Mariscal 06/07/2022, 10:21 AM

## 2022-06-07 NOTE — Progress Notes (Signed)
Subjective: 1 Day Post-Op Procedure(s) (LRB): RIGHT TOTAL KNEE ARTHROPLASTY (Right) Patient reports pain as 3 on 0-10 scale.  No chest pain or SOB. Has been up with PT  and progressing well. Reports daughter unable to come help with her recovery. Lives with 77 year old husband . Will need to be moderately independent for discharge.   Objective: Vital signs in last 24 hours: Temp:  [97 F (36.1 C)-98.9 F (37.2 C)] 97.9 F (36.6 C) (05/29 0742) Pulse Rate:  [75-95] 75 (05/29 0742) Resp:  [11-23] 18 (05/29 0742) BP: (124-153)/(75-93) 128/75 (05/29 0742) SpO2:  [94 %-99 %] 98 % (05/29 0742) Weight:  [85.5 kg] 85.5 kg (05/28 1018)  Intake/Output from previous day: 05/28 0701 - 05/29 0700 In: 1200 [I.V.:1200] Out: 35 [Blood:35] Intake/Output this shift: No intake/output data recorded.  Recent Labs    06/07/22 0724  HGB 11.7*   Recent Labs    06/07/22 0724  WBC 17.7*  RBC 4.02  HCT 36.0  PLT 246   No results for input(s): "NA", "K", "CL", "CO2", "BUN", "CREATININE", "GLUCOSE", "CALCIUM" in the last 72 hours. No results for input(s): "LABPT", "INR" in the last 72 hours.  Dorsiflexion/Plantar flexion intact Incision: scant drainage Compartment soft   Assessment/Plan: 1 Day Post-Op Procedure(s) (LRB): RIGHT TOTAL KNEE ARTHROPLASTY (Right) Up with therapy Dispo possible discharge to home tomorrow.      [name] 06/07/2022, 8:05 AM

## 2022-06-08 MED ORDER — CELECOXIB 200 MG PO CAPS
200.0000 mg | ORAL_CAPSULE | Freq: Two times a day (BID) | ORAL | Status: DC
  Administered 2022-06-08 – 2022-06-09 (×3): 200 mg via ORAL
  Filled 2022-06-08 (×3): qty 1

## 2022-06-08 NOTE — Progress Notes (Signed)
Physical Therapy Treatment Patient Details Name: [name] MRN: [id number] DOB: 07-Oct-1945 Today's Date: 06/08/2022   History of Present Illness 77 y.o. female presents to [facility] hospital on 06/06/2022 for elective R TKA. PMH includes HTN, depression, congenital musculoskeletal deformity of spine, OSA.    PT Comments    Pt received in supine and agreeable to session. Pt reporting increased R knee pain and quad soreness this session that limited mobility tolerance. Pt requesting to use the bathroom, however stating that she will not be able to make it into the bathroom and requesting to use the [facility]. Pt able to ambulate to the [facility] and stand to perform pericare with min guard for safety. Pt ambulating back to bed with increased trunk and B knee flexion despite cues due to pain and fatigue. Pt deferring further ambulation, but is able to tolerate additional seated exercises on the EOB. Will plan to progress gait as able this afternoon. Pt continues to benefit from PT services to progress toward functional mobility goals.    Recommendations for follow up therapy are one component of a multi-disciplinary discharge planning process, led by the attending physician.  Recommendations may be updated based on patient status, additional functional criteria and insurance authorization.     Assistance Recommended at Discharge PRN  Patient can return home with the following A little help with bathing/dressing/bathroom;Assistance with cooking/housework;Assist for transportation;Help with stairs or ramp for entrance;A little help with walking and/or transfers   Equipment Recommendations  None recommended by PT    Recommendations for Other Services       Precautions / Restrictions Precautions Precautions: Fall;Knee Precaution Booklet Issued: Yes (comment) Restrictions Weight Bearing Restrictions: Yes RLE Weight Bearing: Weight bearing as tolerated     Mobility  Bed Mobility Overal bed mobility:  Needs Assistance Bed Mobility: Supine to Sit, Sit to Supine     Supine to sit: Supervision, HOB elevated Sit to supine: Supervision, HOB elevated   General bed mobility comments: Use of bedrails and increased time and effort    Transfers Overall transfer level: Needs assistance Equipment used: Rolling walker (2 wheels) Transfers: Sit to/from Stand Sit to Stand: Min guard           General transfer comment: From EOB with cues for hand placement and increased time for power up.    Ambulation/Gait Ambulation/Gait assistance: Min guard Gait Distance (Feet): 5 Feet (x2) Assistive device: Rolling walker (2 wheels) Gait Pattern/deviations: Decreased stride length, Trunk flexed, Step-to pattern, Knee flexed in stance - left, Knee flexed in stance - right, Antalgic Gait velocity: decreased     General Gait Details: Short distance to [facility] and back to bed. Pt demonstrating very antalgic pattern with trunk flexed over RW and B knees flexed despite cues for extension with pt only briefly correcting. Cues for upright posture with pt only minimally improving.       Balance Overall balance assessment: Needs assistance Sitting-balance support: No upper extremity supported, Feet supported Sitting balance-Leahy Scale: Good Sitting balance - Comments: sitting EOB   Standing balance support: Bilateral upper extremity supported, During functional activity, Reliant on assistive device for balance Standing balance-Leahy Scale: Fair Standing balance comment: with RW support                            Cognition Arousal/Alertness: Awake/alert Behavior During Therapy: WFL for tasks assessed/performed Overall Cognitive Status: Within Functional Limits for tasks assessed  Exercises Total Joint Exercises Heel Slides: AROM, Strengthening, Right, 5 reps, Supine Long Arc Quad: AROM, Seated, Strengthening, Right, 5 reps Knee  Flexion: AROM, Seated, Right, 5 reps    General Comments        Pertinent Vitals/Pain Pain Assessment Pain Assessment: Faces Faces Pain Scale: Hurts whole lot Pain Location: R knee Pain Descriptors / Indicators: Discomfort, Guarding, Grimacing Pain Intervention(s): Monitored during session, Limited activity within patient's tolerance, Repositioned, Ice applied     PT Goals (current goals can now be found in the care plan section) Acute Rehab PT Goals Patient Stated Goal: decreased pain, return to independence PT Goal Formulation: With patient Time For Goal Achievement: 06/10/22 Potential to Achieve Goals: Good Progress towards PT goals: Progressing toward goals    Frequency    7X/week      PT Plan Current plan remains appropriate       AM-PAC PT "6 Clicks" Mobility   Outcome Measure  Help needed turning from your back to your side while in a flat bed without using bedrails?: None Help needed moving from lying on your back to sitting on the side of a flat bed without using bedrails?: A Little Help needed moving to and from a bed to a chair (including a wheelchair)?: A Little Help needed standing up from a chair using your arms (e.g., wheelchair or bedside chair)?: A Little Help needed to walk in hospital room?: A Little Help needed climbing 3-5 steps with a railing? : A Little 6 Click Score: 19    End of Session Equipment Utilized During Treatment: Gait belt Activity Tolerance: Patient limited by pain Patient left: in bed;with call bell/phone within reach Nurse Communication: Mobility status PT Visit Diagnosis: Other abnormalities of gait and mobility (R26.89);Muscle weakness (generalized) (M62.81);Pain;Difficulty in walking, not elsewhere classified (R26.2) Pain - Right/Left: Right Pain - part of body: Knee     Time: 1035-1100 PT Time Calculation (min) (ACUTE ONLY): 25 min  Charges:  $Gait Training: 8-22 mins $Therapeutic Exercise: 8-22 mins                      Johny Shock, PTA Acute Rehabilitation Services Secure Chat Preferred  [phone]    Johny Shock 06/08/2022, 11:12 AM

## 2022-06-08 NOTE — Progress Notes (Signed)
Physical Therapy Treatment Patient Details Name: [name] MRN: [id number] DOB: 1945-11-30 Today's Date: 06/08/2022   History of Present Illness 77 y.o. female presents to [facility] on 06/06/2022 for elective R TKA. PMH includes HTN, depression, congenital musculoskeletal deformity of spine, OSA.    PT Comments    Pt received in supine and agreeable to session. Pt reporting improved pain since earlier session, however continued to report increased R knee pain with WB. Pt able to tolerate increased gait distance this session with multiple standing rest breaks. Pt minimally able to improve upright posture with pt reporting that she flexed trunk during ambulation PTA due to back pain. Pt requesting to use the bathroom and is able to perform pericare in sitting and hand hygiene in standing without assist. Pt continues to benefit from PT services to progress toward functional mobility goals.     Recommendations for follow up therapy are one component of a multi-disciplinary discharge planning process, led by the attending physician.  Recommendations may be updated based on patient status, additional functional criteria and insurance authorization.     Assistance Recommended at Discharge PRN  Patient can return home with the following A little help with bathing/dressing/bathroom;Assistance with cooking/housework;Assist for transportation;Help with stairs or ramp for entrance;A little help with walking and/or transfers   Equipment Recommendations  None recommended by PT    Recommendations for Other Services       Precautions / Restrictions Precautions Precautions: Fall;Knee Precaution Booklet Issued: Yes (comment) Restrictions Weight Bearing Restrictions: Yes RLE Weight Bearing: Weight bearing as tolerated     Mobility  Bed Mobility Overal bed mobility: Needs Assistance Bed Mobility: Supine to Sit, Sit to Supine     Supine to sit: Supervision, HOB elevated Sit to supine: Min  assist, HOB elevated   General bed mobility comments: Min A for RLE elevation to EOB    Transfers Overall transfer level: Needs assistance Equipment used: Rolling walker (2 wheels) Transfers: Sit to/from Stand Sit to Stand: Min guard           General transfer comment: From EOB and toilet with min guard for safety and cues for hand placement    Ambulation/Gait Ambulation/Gait assistance: Min guard Gait Distance (Feet): 100 Feet (+10) Assistive device: Rolling walker (2 wheels) Gait Pattern/deviations: Decreased stride length, Trunk flexed, Step-to pattern, Antalgic Gait velocity: decreased     General Gait Details: Pt able to ambulate the the bathroom and in the hall with min guard for safety. Pt requiring multiple standing rest breaks due to increased pain and fatigue. Pt continuing to flex trunk over RW with pt able to briefly improve with cues. Pt reports this is caused by back pain and occured PTA.       Balance Overall balance assessment: Needs assistance Sitting-balance support: No upper extremity supported, Feet supported Sitting balance-Leahy Scale: Good Sitting balance - Comments: sitting EOB   Standing balance support: Bilateral upper extremity supported, During functional activity, Reliant on assistive device for balance Standing balance-Leahy Scale: Fair Standing balance comment: with RW support                            Cognition Arousal/Alertness: Awake/alert Behavior During Therapy: WFL for tasks assessed/performed Overall Cognitive Status: Within Functional Limits for tasks assessed  Exercises Total Joint Exercises Heel Slides: AROM, Strengthening, Right, 5 reps, Supine Long Arc Quad: AROM, Seated, Strengthening, Right, 5 reps Knee Flexion: AROM, Seated, Right, 5 reps    General Comments        Pertinent Vitals/Pain Pain Assessment Pain Assessment: 0-10 Pain Score: 5   Faces Pain Scale: Hurts whole lot Pain Location: R knee Pain Descriptors / Indicators: Discomfort, Guarding, Grimacing Pain Intervention(s): Limited activity within patient's tolerance, Monitored during session, Repositioned     PT Goals (current goals can now be found in the care plan section) Acute Rehab PT Goals Patient Stated Goal: decreased pain, return to independence PT Goal Formulation: With patient Time For Goal Achievement: 06/10/22 Potential to Achieve Goals: Good Progress towards PT goals: Progressing toward goals    Frequency    7X/week      PT Plan Current plan remains appropriate       AM-PAC PT "6 Clicks" Mobility   Outcome Measure  Help needed turning from your back to your side while in a flat bed without using bedrails?: None Help needed moving from lying on your back to sitting on the side of a flat bed without using bedrails?: A Little Help needed moving to and from a bed to a chair (including a wheelchair)?: A Little Help needed standing up from a chair using your arms (e.g., wheelchair or bedside chair)?: A Little Help needed to walk in hospital room?: A Little Help needed climbing 3-5 steps with a railing? : A Little 6 Click Score: 19    End of Session Equipment Utilized During Treatment: Gait belt Activity Tolerance: Patient tolerated treatment well Patient left: in bed;with call bell/phone within reach Nurse Communication: Mobility status PT Visit Diagnosis: Other abnormalities of gait and mobility (R26.89);Muscle weakness (generalized) (M62.81);Pain;Difficulty in walking, not elsewhere classified (R26.2) Pain - Right/Left: Right Pain - part of body: Knee     Time: 1610-9604 PT Time Calculation (min) (ACUTE ONLY): 37 min  Charges:  $Gait Training: 8-22 mins $Therapeutic Activity: 8-22 mins                     Johny Shock, PTA Acute Rehabilitation Services Secure Chat Preferred  Office:([phone]    Johny Shock 06/08/2022, 3:13 PM

## 2022-06-08 NOTE — Progress Notes (Signed)
Patient ID: [name], female   DOB: 1945/11/06, 77 y.o.   MRN: [id number] The patient is struggling with pain control status post a right total knee arthroplasty.  This is certainly normal after total knee surgery.  Her vital signs are stable.  Her right operative knee is stable and her calf is soft.  We will continue to work on pain control and hopefully can continue therapy while she is here with the plan of discharge to home in the next 1 to 2 days with home health PT.

## 2022-06-09 MED ORDER — ASPIRIN 81 MG PO CHEW: 81.0000 mg | CHEWABLE_TABLET | Freq: Two times a day (BID) | ORAL | 0 refills | Status: DC

## 2022-06-09 MED ORDER — METHOCARBAMOL 500 MG PO TABS: 500.0000 mg | ORAL_TABLET | Freq: Four times a day (QID) | ORAL | 1 refills | Status: DC | PRN

## 2022-06-09 MED ORDER — OXYCODONE HCL 5 MG PO TABS: 5.0000 mg | ORAL_TABLET | ORAL | 0 refills | Status: DC | PRN

## 2022-06-09 NOTE — Progress Notes (Signed)
Patient ID: [name], female   DOB: 04-03-1945, 77 y.o.   MRN: [id number] The patient is stable overall.  Her vital signs are stable.  Her right operative knee is stable.  She is mobilizing well with therapy.  She can be discharged to home today.

## 2022-06-09 NOTE — Progress Notes (Signed)
Physical Therapy Treatment Patient Details Name: [name] MRN: [id number] DOB: 01/03/1946 Today's Date: 06/09/2022   History of Present Illness 77 y.o. female presents to [facility] hospital on 06/06/2022 for elective R TKA. PMH includes HTN, depression, congenital musculoskeletal deformity of spine, OSA.    PT Comments    Pt received sitting EOB with NT and agreeable to session. Pt continues to be limited by R knee pain, but is motivated to participate in mobility tasks. Pt requiring multiple standing rest breaks during gait trial due to increased pain and fatigue evidenced by increased trunk flexion. Pt unable to significantly improve upright posture. Pt declining stair trial this session, but reports feeling comfortable with the technique from previous sessions. Educated pt on importance of exercises and mobility throughout the day to decrease stiffness and improve ROM. Anticipate pt and family will be able to manage pt's mobility needs at home.    Recommendations for follow up therapy are one component of a multi-disciplinary discharge planning process, led by the attending physician.  Recommendations may be updated based on patient status, additional functional criteria and insurance authorization.     Assistance Recommended at Discharge PRN  Patient can return home with the following A little help with bathing/dressing/bathroom;Assistance with cooking/housework;Assist for transportation;Help with stairs or ramp for entrance;A little help with walking and/or transfers   Equipment Recommendations  None recommended by PT    Recommendations for Other Services       Precautions / Restrictions Precautions Precautions: Fall;Knee Precaution Booklet Issued: Yes (comment) Restrictions Weight Bearing Restrictions: Yes RLE Weight Bearing: Weight bearing as tolerated     Mobility  Bed Mobility               General bed mobility comments: Pt sitting EOB upon arrival     Transfers Overall transfer level: Needs assistance Equipment used: Rolling walker (2 wheels) Transfers: Sit to/from Stand Sit to Stand: Min guard           General transfer comment: from low EOB with increased time for power up    Ambulation/Gait Ambulation/Gait assistance: Min guard Gait Distance (Feet): 75 Feet Assistive device: Rolling walker (2 wheels) Gait Pattern/deviations: Decreased stride length, Trunk flexed, Step-to pattern, Antalgic, Step-through pattern Gait velocity: decreased     General Gait Details: Pt demonstrating slow step-to pattern progressing to step-through with progressed distance. Pt requiring multiple standing rest breaks due to increased pain. Cues for increased BUE support and upright posture      Balance Overall balance assessment: Needs assistance Sitting-balance support: No upper extremity supported, Feet supported Sitting balance-Leahy Scale: Good Sitting balance - Comments: sitting EOB   Standing balance support: Bilateral upper extremity supported, During functional activity, Reliant on assistive device for balance Standing balance-Leahy Scale: Fair Standing balance comment: with RW support                            Cognition Arousal/Alertness: Awake/alert Behavior During Therapy: WFL for tasks assessed/performed Overall Cognitive Status: Within Functional Limits for tasks assessed                                          Exercises      General Comments        Pertinent Vitals/Pain Pain Assessment Pain Assessment: Faces Faces Pain Scale: Hurts whole lot Pain Location: R knee Pain Descriptors /  Indicators: Discomfort, Guarding, Grimacing Pain Intervention(s): Monitored during session, Repositioned, Limited activity within patient's tolerance     PT Goals (current goals can now be found in the care plan section) Acute Rehab PT Goals Patient Stated Goal: decreased pain, return to  independence PT Goal Formulation: With patient Time For Goal Achievement: 06/10/22 Potential to Achieve Goals: Good Progress towards PT goals: Progressing toward goals    Frequency    7X/week      PT Plan Current plan remains appropriate       AM-PAC PT "6 Clicks" Mobility   Outcome Measure  Help needed turning from your back to your side while in a flat bed without using bedrails?: None Help needed moving from lying on your back to sitting on the side of a flat bed without using bedrails?: A Little Help needed moving to and from a bed to a chair (including a wheelchair)?: A Little Help needed standing up from a chair using your arms (e.g., wheelchair or bedside chair)?: A Little Help needed to walk in hospital room?: A Little Help needed climbing 3-5 steps with a railing? : A Little 6 Click Score: 19    End of Session Equipment Utilized During Treatment: Gait belt Activity Tolerance: Patient tolerated treatment well Patient left: with call bell/phone within reach;in chair Nurse Communication: Mobility status PT Visit Diagnosis: Other abnormalities of gait and mobility (R26.89);Muscle weakness (generalized) (M62.81);Pain;Difficulty in walking, not elsewhere classified (R26.2) Pain - Right/Left: Right Pain - part of body: Knee     Time: 7829-5621 PT Time Calculation (min) (ACUTE ONLY): 19 min  Charges:  $Gait Training: 8-22 mins                     Johny Shock, PTA Acute Rehabilitation Services Secure Chat Preferred  Office:(336) [phone]    Johny Shock 06/09/2022, 9:13 AM

## 2022-06-09 NOTE — Plan of Care (Signed)
  Problem: Education: Goal: Knowledge of General Education information will improve Description: Including pain rating scale, medication(s)/side effects and non-pharmacologic comfort measures Outcome: Adequate for Discharge   Problem: Clinical Measurements: Goal: Ability to maintain clinical measurements within normal limits will improve Outcome: Adequate for Discharge Goal: Will remain free from infection Outcome: Adequate for Discharge   Problem: Activity: Goal: Risk for activity intolerance will decrease Outcome: Adequate for Discharge   Problem: Nutrition: Goal: Adequate nutrition will be maintained Outcome: Adequate for Discharge   Problem: Coping: Goal: Level of anxiety will decrease Outcome: Adequate for Discharge   Problem: Elimination: Goal: Will not experience complications related to bowel motility Outcome: Adequate for Discharge   Problem: Pain Managment: Goal: General experience of comfort will improve Outcome: Adequate for Discharge   Problem: Safety: Goal: Ability to remain free from injury will improve Outcome: Adequate for Discharge

## 2022-06-09 NOTE — Discharge Summary (Signed)
Patient ID: [name] MRN: [id number] DOB/AGE: July 16, 1945 77 y.o.  Admit date: 06/06/2022 Discharge date: 06/09/2022  Admission Diagnoses:  Principal Problem:   Status post total right knee replacement Active Problems:   Unilateral primary osteoarthritis, right knee   Discharge Diagnoses:  Same  Past Medical History:  Diagnosis Date   Anemia    Congenital musculoskeletal deformity of spine    Depression    Headache    Hypertension    Incontinence of bowel 09/2013   Lumbago    Lumbosacral spondylosis without myelopathy    Sleep apnea    Sleep disorder    uses gabapentin at bedtime    Spinal stenosis, lumbar region, without neurogenic claudication     Surgeries: Procedure(s): RIGHT TOTAL KNEE ARTHROPLASTY on 06/06/2022   Consultants:   Discharged Condition: Improved  Hospital Course: [name] is an 77 y.o. female who was admitted 06/06/2022 for operative treatment ofStatus post total right knee replacement. Patient has severe unremitting pain that affects sleep, daily activities, and work/hobbies. After pre-op clearance the patient was taken to the operating room on 06/06/2022 and underwent  Procedure(s): RIGHT TOTAL KNEE ARTHROPLASTY.    Patient was given perioperative antibiotics:  Anti-infectives (From admission, onward)    Start     Dose/Rate Route Frequency Ordered Stop   06/06/22 2000  ceFAZolin (ANCEF) IVPB 1 g/50 mL premix        1 g 100 mL/hr over 30 Minutes Intravenous Every 6 hours 06/06/22 1657 06/07/22 2104   06/06/22 1015  ceFAZolin (ANCEF) IVPB 2g/100 mL premix        2 g 200 mL/hr over 30 Minutes Intravenous On call to O.R. 06/06/22 1009 06/06/22 1329   06/06/22 1011  ceFAZolin (ANCEF) 2-4 GM/100ML-% IVPB       Note to Pharmacy: Camillo Flaming: cabinet override      06/06/22 1011 06/06/22 1331        Patient was given sequential compression devices, early ambulation, and chemoprophylaxis to prevent DVT.  Patient benefited  maximally from hospital stay and there were no complications.    Recent vital signs: Patient Vitals for the past 24 hrs:  BP Temp Temp src Pulse Resp SpO2  06/09/22 0851 128/74 98.3 F (36.8 C) Oral 98 18 95 %  06/09/22 0603 136/75 98 F (36.7 C) Oral 81 19 95 %  06/08/22 2033 122/66 98.1 F (36.7 C) Oral 91 18 95 %  06/08/22 1334 119/86 98.7 F (37.1 C) Oral 91 20 94 %     Recent laboratory studies:  Recent Labs    06/07/22 0724  WBC 17.7*  HGB 11.7*  HCT 36.0  PLT 246  NA 134*  K 4.1  CL 103  CO2 23  BUN 9  CREATININE 0.71  GLUCOSE 140*  CALCIUM 8.4*     Discharge Medications:   Allergies as of 06/09/2022       Reactions   Lisinopril Cough        Medication List     TAKE these medications    aspirin 81 MG chewable tablet Chew 1 tablet (81 mg total) by mouth 2 (two) times daily.   Biofreeze 10 % Liqd Generic drug: Menthol (Topical Analgesic) Apply 1 application  topically daily as needed (pain).   buPROPion 150 MG 24 hr tablet Commonly known as: WELLBUTRIN XL Take 450 mg by mouth daily.   celecoxib 200 MG capsule Commonly known as: CELEBREX TAKE 1 CAPSULE (200 MG TOTAL) BY MOUTH 2 (TWO) TIMES  DAILY BETWEEN MEALS AS NEEDED.   cetirizine 10 MG tablet Commonly known as: ZYRTEC Take 10 mg by mouth daily as needed for allergies.   diphenhydrAMINE 50 MG capsule Commonly known as: BENADRYL Take 50 mg by mouth at bedtime as needed for sleep.   docusate sodium 50 MG capsule Commonly known as: COLACE Take 50 mg by mouth daily.   gabapentin 600 MG tablet Commonly known as: NEURONTIN Take 1 tablet (600 mg total) by mouth daily. 3 tabs QHS What changed:  when to take this additional instructions   ibuprofen 200 MG tablet Commonly known as: ADVIL Take 200 mg by mouth every 6 (six) hours as needed for moderate pain.   losartan 50 MG tablet Commonly known as: COZAAR Take 50 mg by mouth daily.   methocarbamol 500 MG tablet Commonly known as:  ROBAXIN Take 1 tablet (500 mg total) by mouth every 6 (six) hours as needed for muscle spasms.   oxyCODONE 5 MG immediate release tablet Commonly known as: Oxy IR/ROXICODONE Take 1-2 tablets (5-10 mg total) by mouth every 4 (four) hours as needed for moderate pain (pain score 4-6).   rosuvastatin 40 MG tablet Commonly known as: CRESTOR Take 40 mg by mouth daily.   VISINE OP Place 1 drop into both eyes daily as needed (allergies).   Vitamin D (Ergocalciferol) 1.25 MG (50000 UNIT) Caps capsule Commonly known as: DRISDOL Take 50,000 Units by mouth every Thursday.               Durable Medical Equipment  (From admission, onward)           Start     Ordered   06/06/22 1658  DME 3 n 1  Once        06/06/22 1657   06/06/22 1658  DME Walker rolling  Once       Question Answer Comment  Walker: With 5 Inch Wheels   Patient needs a walker to treat with the following condition Status post total right knee replacement      06/06/22 1657            Diagnostic Studies: DG Knee Right Port  Result Date: 06/06/2022 CLINICAL DATA:  Status post total knee replacement. EXAM: PORTABLE RIGHT KNEE - 1-2 VIEW COMPARISON:  None Available. FINDINGS: Right knee arthroplasty in expected alignment. No periprosthetic lucency or fracture. There has been patellar resurfacing. Recent postsurgical change includes air and edema in the soft tissues and joint space. Anterior skin staples in place. IMPRESSION: Right knee arthroplasty without immediate postoperative complication. Electronically Signed   By: Narda Rutherford M.D.   On: 06/06/2022 16:44    Disposition: Discharge disposition: 01-Home or Self Care          Follow-up Information     Kathryne Hitch, MD Follow up in 2 week(s).   Specialty: Orthopedic Surgery Contact information: [address] [place] [address] [phone]         [facility] Follow up.   Specialty: Home Health Services Why:  home health services will be provided by [facility], start of care within 48 hours post discharge Contact information: [address] [place] [address] [phone]                  Signed: Kathryne Hitch 06/09/2022, 12:04 PM

## 2022-06-09 NOTE — Progress Notes (Signed)
Discharge summary (AVS) packet provided to pt with instructions. Pt verbalized understanding of instructions. NO complaints. Pt d/c to home as ordered. She remains alert/oriented in no apparent distress. Spouse is responsible for her ride.

## 2022-06-10 DIAGNOSIS — I10 Essential (primary) hypertension: Secondary | ICD-10-CM | POA: Diagnosis not present

## 2022-06-10 DIAGNOSIS — Z791 Long term (current) use of non-steroidal anti-inflammatories (NSAID): Secondary | ICD-10-CM | POA: Diagnosis not present

## 2022-06-10 DIAGNOSIS — Z96641 Presence of right artificial hip joint: Secondary | ICD-10-CM | POA: Diagnosis not present

## 2022-06-10 DIAGNOSIS — E042 Nontoxic multinodular goiter: Secondary | ICD-10-CM | POA: Diagnosis not present

## 2022-06-10 DIAGNOSIS — F32A Depression, unspecified: Secondary | ICD-10-CM | POA: Diagnosis not present

## 2022-06-10 DIAGNOSIS — Z96651 Presence of right artificial knee joint: Secondary | ICD-10-CM | POA: Diagnosis not present

## 2022-06-10 DIAGNOSIS — Z471 Aftercare following joint replacement surgery: Secondary | ICD-10-CM | POA: Diagnosis not present

## 2022-06-10 DIAGNOSIS — M47817 Spondylosis without myelopathy or radiculopathy, lumbosacral region: Secondary | ICD-10-CM | POA: Diagnosis not present

## 2022-06-10 DIAGNOSIS — Q675 Congenital deformity of spine: Secondary | ICD-10-CM | POA: Diagnosis not present

## 2022-06-10 DIAGNOSIS — M48061 Spinal stenosis, lumbar region without neurogenic claudication: Secondary | ICD-10-CM | POA: Diagnosis not present

## 2022-06-10 DIAGNOSIS — G4730 Sleep apnea, unspecified: Secondary | ICD-10-CM | POA: Diagnosis not present

## 2022-06-10 DIAGNOSIS — E663 Overweight: Secondary | ICD-10-CM | POA: Diagnosis not present

## 2022-06-10 DIAGNOSIS — Z7982 Long term (current) use of aspirin: Secondary | ICD-10-CM | POA: Diagnosis not present

## 2022-06-10 DIAGNOSIS — D649 Anemia, unspecified: Secondary | ICD-10-CM | POA: Diagnosis not present

## 2022-06-12 ENCOUNTER — Other Ambulatory Visit: Payer: Self-pay | Admitting: *Deleted

## 2022-06-12 DIAGNOSIS — D649 Anemia, unspecified: Secondary | ICD-10-CM | POA: Diagnosis not present

## 2022-06-12 DIAGNOSIS — M48061 Spinal stenosis, lumbar region without neurogenic claudication: Secondary | ICD-10-CM | POA: Diagnosis not present

## 2022-06-12 DIAGNOSIS — I10 Essential (primary) hypertension: Secondary | ICD-10-CM | POA: Diagnosis not present

## 2022-06-12 DIAGNOSIS — M1711 Unilateral primary osteoarthritis, right knee: Secondary | ICD-10-CM

## 2022-06-12 DIAGNOSIS — Z96651 Presence of right artificial knee joint: Secondary | ICD-10-CM

## 2022-06-12 DIAGNOSIS — M47817 Spondylosis without myelopathy or radiculopathy, lumbosacral region: Secondary | ICD-10-CM | POA: Diagnosis not present

## 2022-06-12 DIAGNOSIS — Q675 Congenital deformity of spine: Secondary | ICD-10-CM | POA: Diagnosis not present

## 2022-06-12 DIAGNOSIS — Z471 Aftercare following joint replacement surgery: Secondary | ICD-10-CM | POA: Diagnosis not present

## 2022-06-12 NOTE — Progress Notes (Signed)
Ortho bundle D/C call completed. 

## 2022-06-14 DIAGNOSIS — I10 Essential (primary) hypertension: Secondary | ICD-10-CM | POA: Diagnosis not present

## 2022-06-14 DIAGNOSIS — Q675 Congenital deformity of spine: Secondary | ICD-10-CM | POA: Diagnosis not present

## 2022-06-14 DIAGNOSIS — Z471 Aftercare following joint replacement surgery: Secondary | ICD-10-CM | POA: Diagnosis not present

## 2022-06-14 DIAGNOSIS — D649 Anemia, unspecified: Secondary | ICD-10-CM | POA: Diagnosis not present

## 2022-06-14 DIAGNOSIS — M48061 Spinal stenosis, lumbar region without neurogenic claudication: Secondary | ICD-10-CM | POA: Diagnosis not present

## 2022-06-14 DIAGNOSIS — M47817 Spondylosis without myelopathy or radiculopathy, lumbosacral region: Secondary | ICD-10-CM | POA: Diagnosis not present

## 2022-06-15 DIAGNOSIS — Q675 Congenital deformity of spine: Secondary | ICD-10-CM | POA: Diagnosis not present

## 2022-06-15 DIAGNOSIS — Z471 Aftercare following joint replacement surgery: Secondary | ICD-10-CM | POA: Diagnosis not present

## 2022-06-15 DIAGNOSIS — D649 Anemia, unspecified: Secondary | ICD-10-CM | POA: Diagnosis not present

## 2022-06-15 DIAGNOSIS — M48061 Spinal stenosis, lumbar region without neurogenic claudication: Secondary | ICD-10-CM | POA: Diagnosis not present

## 2022-06-15 DIAGNOSIS — M47817 Spondylosis without myelopathy or radiculopathy, lumbosacral region: Secondary | ICD-10-CM | POA: Diagnosis not present

## 2022-06-15 DIAGNOSIS — I10 Essential (primary) hypertension: Secondary | ICD-10-CM | POA: Diagnosis not present

## 2022-06-16 ENCOUNTER — Other Ambulatory Visit: Payer: Self-pay | Admitting: Orthopaedic Surgery

## 2022-06-16 ENCOUNTER — Telehealth: Payer: Self-pay | Admitting: *Deleted

## 2022-06-16 DIAGNOSIS — M48061 Spinal stenosis, lumbar region without neurogenic claudication: Secondary | ICD-10-CM | POA: Diagnosis not present

## 2022-06-16 DIAGNOSIS — Q675 Congenital deformity of spine: Secondary | ICD-10-CM | POA: Diagnosis not present

## 2022-06-16 DIAGNOSIS — D649 Anemia, unspecified: Secondary | ICD-10-CM | POA: Diagnosis not present

## 2022-06-16 DIAGNOSIS — Z471 Aftercare following joint replacement surgery: Secondary | ICD-10-CM | POA: Diagnosis not present

## 2022-06-16 DIAGNOSIS — I10 Essential (primary) hypertension: Secondary | ICD-10-CM | POA: Diagnosis not present

## 2022-06-16 DIAGNOSIS — M47817 Spondylosis without myelopathy or radiculopathy, lumbosacral region: Secondary | ICD-10-CM | POA: Diagnosis not present

## 2022-06-16 MED ORDER — OXYCODONE HCL 5 MG PO TABS: 5.0000 mg | ORAL_TABLET | ORAL | 0 refills | Status: DC | PRN

## 2022-06-16 NOTE — Telephone Encounter (Signed)
Spoke with patient. Needs refill of pain medication. Thank you.

## 2022-06-19 ENCOUNTER — Encounter: Payer: Self-pay | Admitting: Orthopaedic Surgery

## 2022-06-19 ENCOUNTER — Ambulatory Visit: Payer: Medicare Other | Admitting: Physical Therapy

## 2022-06-19 ENCOUNTER — Ambulatory Visit (INDEPENDENT_AMBULATORY_CARE_PROVIDER_SITE_OTHER): Payer: Medicare Other | Admitting: Orthopaedic Surgery

## 2022-06-19 DIAGNOSIS — Z96651 Presence of right artificial knee joint: Secondary | ICD-10-CM

## 2022-06-19 NOTE — Progress Notes (Signed)
The patient comes in today for first visit status post a right total knee arthroplasty.  She is still a lot of pain but she is doing excellent from my standpoint.  Her right knee incision looks good.  The staples are removed and Steri-Strips applied.  She actually can flex to almost 90 degrees and may be just beyond that when I push her.  Her extension lacks full extension by at least 3 degrees.  The knee feels ligamentously stable.  Her calf is soft.  She can stop her baby aspirin twice a day.  I would like her to wear the TED hose for at least 1 more week.  She can stop wearing this if there is no a lot of foot and ankle swelling.  She does have outpatient therapy starting this Wednesday.  When she does run on pain medication she knows to give [place] a call.  All question concerns were answered addressed.  Will see her back in 4 weeks to see how she is doing overall but no x-rays are needed.

## 2022-06-20 DIAGNOSIS — D649 Anemia, unspecified: Secondary | ICD-10-CM | POA: Diagnosis not present

## 2022-06-20 DIAGNOSIS — Q675 Congenital deformity of spine: Secondary | ICD-10-CM | POA: Diagnosis not present

## 2022-06-20 DIAGNOSIS — Z471 Aftercare following joint replacement surgery: Secondary | ICD-10-CM | POA: Diagnosis not present

## 2022-06-20 DIAGNOSIS — M48061 Spinal stenosis, lumbar region without neurogenic claudication: Secondary | ICD-10-CM | POA: Diagnosis not present

## 2022-06-20 DIAGNOSIS — I10 Essential (primary) hypertension: Secondary | ICD-10-CM | POA: Diagnosis not present

## 2022-06-20 DIAGNOSIS — M47817 Spondylosis without myelopathy or radiculopathy, lumbosacral region: Secondary | ICD-10-CM | POA: Diagnosis not present

## 2022-06-21 ENCOUNTER — Other Ambulatory Visit: Payer: Self-pay

## 2022-06-21 ENCOUNTER — Encounter: Payer: Self-pay | Admitting: Physical Therapy

## 2022-06-21 ENCOUNTER — Ambulatory Visit (INDEPENDENT_AMBULATORY_CARE_PROVIDER_SITE_OTHER): Payer: Medicare Other | Admitting: Physical Therapy

## 2022-06-21 DIAGNOSIS — R2681 Unsteadiness on feet: Secondary | ICD-10-CM | POA: Diagnosis not present

## 2022-06-21 DIAGNOSIS — M25561 Pain in right knee: Secondary | ICD-10-CM | POA: Diagnosis not present

## 2022-06-21 DIAGNOSIS — R600 Localized edema: Secondary | ICD-10-CM

## 2022-06-21 DIAGNOSIS — R2689 Other abnormalities of gait and mobility: Secondary | ICD-10-CM | POA: Diagnosis not present

## 2022-06-21 DIAGNOSIS — M25661 Stiffness of right knee, not elsewhere classified: Secondary | ICD-10-CM | POA: Diagnosis not present

## 2022-06-21 DIAGNOSIS — M6281 Muscle weakness (generalized): Secondary | ICD-10-CM

## 2022-06-21 NOTE — Addendum Note (Signed)
Addended by: Fraser Din on: 06/21/2022 12:25 PM   Modules accepted: Orders

## 2022-06-21 NOTE — Therapy (Signed)
OUTPATIENT PHYSICAL THERAPY LOWER EXTREMITY EVALUATION   Patient Name: [name] MRN: [id number] DOB:May 30, 1945, 77 y.o., female Today's Date: 06/21/2022  END OF SESSION:  PT End of Session - 06/21/22 1014     Visit Number 1    Number of Visits 24    Date for PT Re-Evaluation 09/08/22    Authorization Type Medicare A&B and [employer]BS Fed emp    Progress Note Due on Visit 10    PT Start Time 0930    PT Stop Time 1013    PT Time Calculation (min) 43 min    Activity Tolerance Patient tolerated treatment well;Patient limited by pain;Patient limited by fatigue    Behavior During Therapy [facility] for tasks assessed/performed             Past Medical History:  Diagnosis Date   Anemia    Congenital musculoskeletal deformity of spine    Depression    Headache    Hypertension    Incontinence of bowel 09/2013   Lumbago    Lumbosacral spondylosis without myelopathy    Sleep apnea    Sleep disorder    uses gabapentin at bedtime    Spinal stenosis, lumbar region, without neurogenic claudication    Past Surgical History:  Procedure Laterality Date   ANKLE SURGERY Right 2015   lengthened the achilles    ANTERIOR HIP REVISION Right 10/07/2020   Procedure: RIGHT HIP REVISION OF  HIP BALL/POLY-LINER;  Surgeon: Kathryne Hitch, MD;  Location: [facility] OR;  Service: Orthopedics;  Laterality: Right;  RNFA PLEASE   AUGMENTATION MAMMAPLASTY Bilateral 2000 or 2001 unsure    Left Implant has busted and is no longer visible   BACK SURGERY  2015   fusion , Dr Noel Gerold at [facility]; has 2 metal rods in place , fusion from t2 to s1    BACK SURGERY  01/2017   Dr Sharolyn Douglas ; repair of cracked titanium rod in back lumbar    BREAST EXCISIONAL BIOPSY Left    30 yrs ago   bunionectomy  Bilateral 1980s   multiple    COLONOSCOPY     with polypectomy ; q 5 years    FOOT SURGERY  07/2011   SHOULDER SURGERY Left 2013   rotator cuff    TOTAL HIP ARTHROPLASTY Right 05/08/2017   Procedure:  RIGHT TOTAL HIP ARTHROPLASTY ANTERIOR APPROACH;  Surgeon: Durene Romans, MD;  Location: [facility];  Service: Orthopedics;  Laterality: Right;  70 mins   TOTAL KNEE ARTHROPLASTY Right 06/06/2022   Procedure: RIGHT TOTAL KNEE ARTHROPLASTY;  Surgeon: Kathryne Hitch, MD;  Location: [facility] OR;  Service: Orthopedics;  Laterality: Right;   Patient Active Problem List   Diagnosis Date Noted   Status post total right knee replacement 06/06/2022   Leg length discrepancy, Right 10/07/2020   History of revision of total replacement of right hip joint 10/07/2020   Multinodular goiter 05/18/2020   Hip contracture, unspecified laterality 07/02/2018   Overweight (BMI 25.0-29.9) 05/09/2017   S/P right THA, AA 05/08/2017   Chronic bilateral low back pain without sciatica 04/05/2015   Disorder of sacroiliac joint 12/22/2014   Chronic low back pain 12/22/2014   Acquired scoliosis 11/27/2011   Lumbosacral spondylosis without myelopathy 05/22/2011    PCP: Jarrett Soho, MD  REFERRING PROVIDER: Kathryne Hitch, MD  REFERRING DIAG:  [phone] (ICD-10-CM) - Status post total right knee replacement  M17.11 (ICD-10-CM) - Unilateral primary osteoarthritis, right knee   THERAPY DIAG:  Acute pain  of right knee  Stiffness of right knee, not elsewhere classified  Muscle weakness (generalized)  Other abnormalities of gait and mobility  Unsteadiness on feet  Localized edema  Rationale for Evaluation and Treatment: Rehabilitation  ONSET DATE: 06/06/2022 TKA  SUBJECTIVE:   SUBJECTIVE STATEMENT: Patient underwent a right Total Knee Arthroplasty on 06/06/2022 due to disabling OA. She had HHPT thru 06/20/2022.   PERTINENT HISTORY: Right TKA 06/06/2022, OA, leg length discrepancy, right Total hip replacement anterior approach with revision 2022, hip contracture, chronic LBP with surgery, acquired scoliosis, HTN, ankle surgery lengthen Achilles,   PAIN:  NPRS scale: today 7/10 and over last week  3/10 - 10/10 Pain location: right knee along joint line Pain description: at rest dull ache and intermittent spasm & sharp pain Aggravating factors: up too long, first moving if knee has gotten stiffen Relieving factors: Oxy, muscle relax & ice  PRECAUTIONS: Fall  WEIGHT BEARING RESTRICTIONS: No  FALLS:  Has patient fallen in last 6 months? No  LIVING ENVIRONMENT: Lives with: lives with their spouse and 2 dogs 12-14# Lives in: [address] house with master bedroom & bathroom main level Stairs: Yes: Internal: 16-18 steps; on left going up and External: 3 steps; none Has following equipment at home: Single point cane, Environmental consultant - 2 wheeled, Marine scientist  OCCUPATION: retired from Engineer, civil (consulting),   PLOF: Independent  PATIENT GOALS:  walk & be active throughout her day, go upstairs to sewing room, sewing machine uses RLE  Next MD visit: 07/17/2022  OBJECTIVE:  DIAGNOSTIC FINDINGS: 06/06/2022 post op X-ray showed right knee arthroplasty without immediate postoperative complication.  PATIENT SURVEYS:  FOTO intake:  29%  predicted:  57%  COGNITION: Overall cognitive status: WFL    SENSATION: WFL  EDEMA:  Circumferential:  LLE: above knee 43cm,  around knee 39.5cm, below knee 35 cm RLE: above knee 45.7cm,  around knee 40.8cm, below knee 38.9 cm  POSTURE: rounded shoulders, forward head, flexed trunk , and weight shift left  PALPATION: Right knee tenderness along joint line, right ITB tightness & tenderness  LOWER EXTREMITY ROM:   ROM Right eval Left eval  Hip flexion    Hip extension    Hip abduction    Hip adduction    Hip internal rotation    Hip external rotation    Knee flexion Seated A:84* P: 93*   Knee extension Supine P: -7*   Ankle dorsiflexion    Ankle plantarflexion    Ankle inversion    Ankle eversion     (Blank rows = not tested)  LOWER EXTREMITY MMT:  MMT Right eval Left eval  Hip flexion    Hip extension    Hip abduction    Hip adduction     Hip internal rotation    Hip external rotation    Knee flexion 3-/5   Knee extension 3-/5   Ankle dorsiflexion    Ankle plantarflexion    Ankle inversion    Ankle eversion     (Blank rows = not tested)   FUNCTIONAL TESTS:  22 inch chair transfer: requires use of BUEs on armrest, RLE extended with minimal assistance and requires RW for stabilization  GAIT: Distance walked: 100' Assistive device utilized: Environmental consultant - 2 wheeled Level of assistance: SBA Comments: RLE decreased stance duration, abduction, right knee flexed in stance with minimal increase in flexion for swing.    TODAY'S TREATMENT  DATE: 06/21/2022: Therex:  HEP instruction/performance c cues for techniques, handout provided.  Trial set performed of each for comprehension and symptom assessment.  See below for exercise list PT recommended elevation >/= 15 min >/= 2x/day.   PATIENT EDUCATION:  Education details: HEP, POC Person educated: Patient Education method: Programmer, multimedia, Demonstration, Verbal cues, and Handouts Education comprehension: verbalized understanding, returned demonstration, and verbal cues required  HOME EXERCISE PROGRAM: Access Code: [email or web address] URL: [email or web address] Date: 06/21/2022 Prepared by: Vladimir Faster  Exercises - Ankle Alphabet in Elevation  - 2-4 x daily - 7 x weekly - 1 sets - 1 reps - supine quad set with towel roll under ankle  - 2-3 x daily - 7 x weekly - 2-3 sets - 10 reps - 5 seconds hold - Supine Heel Slide with Strap  - 2-3 x daily - 7 x weekly - 2-3 sets - 10 reps - 5 seconds hold - Supine Knee Extension Strengthening  - 2-3 x daily - 7 x weekly - 2-3 sets - 10 reps - 5 seconds hold - Supine Straight Leg Raises  - 2-3 x daily - 7 x weekly - 2-3 sets - 10 reps - 5 seconds hold - Seated Knee Flexion Extension AROM   - 2-4 x daily - 7 x weekly - 2-3 sets - 10 reps - 5 seconds hold - Seated  straight leg lifts  - 2-3 x daily - 7 x weekly - 2-3 sets - 10 reps - 5 seconds hold - Seated Hamstring Stretch with Strap  - 2-4 x daily - 7 x weekly - 1 sets - 3 reps - 20-30 seconds hold - Seated Long Arc Quad  - 2-4 x daily - 7 x weekly - 2-3 sets - 10 reps - 5 seconds hold  ASSESSMENT: CLINICAL IMPRESSION: Patient is a 77 y.o. who comes to clinic with complaints of right knee pain with mobility, strength and movement coordination deficits that impair their ability to perform usual daily and recreational functional activities without increase difficulty/symptoms at this time. Patient's rehab will be complicated & slowed by multiple medical / orthopedic issues.  Patient to benefit from skilled PT services to address impairments and limitations to improve to previous level of function without restriction secondary to condition.   OBJECTIVE IMPAIRMENTS: Abnormal gait, decreased activity tolerance, decreased balance, decreased endurance, decreased knowledge of condition, decreased knowledge of use of DME, decreased mobility, difficulty walking, decreased ROM, decreased strength, increased edema, increased muscle spasms, postural dysfunction, and pain.   ACTIVITY LIMITATIONS: carrying, lifting, bending, sitting, standing, squatting, sleeping, stairs, transfers, bed mobility, and locomotion level  PARTICIPATION LIMITATIONS: meal prep, cleaning, laundry, driving, and community activity  PERSONAL FACTORS: Age, Fitness, Past/current experiences, Time since onset of injury/illness/exacerbation, and 3+ comorbidities: see PMH  are also affecting patient's functional outcome.   REHAB POTENTIAL: Good  CLINICAL DECISION MAKING: Stable/uncomplicated  EVALUATION COMPLEXITY: Low   GOALS: Goals reviewed with patient? Yes  SHORT TERM GOALS: (target date for Short term goals 07/21/2022)   1.  Patient will demonstrate independent use of home exercise program to maintain progress from in clinic  treatments.  Goal status: New  2. PROM right knee ext -2* and flexion 100* Goal status: New  3. Interim FOTO >/= 40%  Goal status: New  LONG TERM GOALS: (target dates for all long term goals  09/08/2022 )   1. Patient will demonstrate/report pain at worst less than or equal to 2/10 to facilitate minimal limitation in daily activity secondary to pain  symptoms.  Goal status: New   2. Patient will demonstrate independent use of home exercise program to facilitate ability to maintain/progress functional gains from skilled physical therapy services.  Goal status: New   3. Patient will demonstrate FOTO outcome > or = 57 % to indicate reduced disability due to condition.  Goal status: New   4.  Patient will demonstrate right knee MMT >/= 4/5 throughout to faciltiate usual transfers, stairs, squatting at [facility] for daily life.   Goal status: New   5.  Patient AROM right knee ext 0* and flexion 100* Goal status: New   6.  patient amb >500' & neg ramp/curb with LRAD modified independent.  Goal status: New    PLAN:  PT FREQUENCY:  2x/week  PT DURATION: 12 weeks  PLANNED INTERVENTIONS: Therapeutic exercises, Therapeutic activity, Neuro Muscular re-education, Balance training, Gait training, Patient/Family education, Joint mobilization, Stair training, DME instructions, Dry Needling, Electrical stimulation, Traction, Cryotherapy, vasopneumatic deviceMoist heat, Taping, Ultrasound, Ionotophoresis 4mg /ml Dexamethasone, and aquatic therapy, Manual therapy.  All included unless contraindicated  PLAN FOR NEXT SESSION: Review HEP & update HEP including ITB stretch, manual therapy & exercise for range, vaso for edema.    Vladimir Faster, PT, DPT 06/21/2022, 12:24 PM

## 2022-06-23 ENCOUNTER — Encounter: Payer: Self-pay | Admitting: Rehabilitative and Restorative Service Providers"

## 2022-06-23 ENCOUNTER — Ambulatory Visit (INDEPENDENT_AMBULATORY_CARE_PROVIDER_SITE_OTHER): Payer: Medicare Other | Admitting: Rehabilitative and Restorative Service Providers"

## 2022-06-23 DIAGNOSIS — R600 Localized edema: Secondary | ICD-10-CM

## 2022-06-23 DIAGNOSIS — R2681 Unsteadiness on feet: Secondary | ICD-10-CM

## 2022-06-23 DIAGNOSIS — R2689 Other abnormalities of gait and mobility: Secondary | ICD-10-CM | POA: Diagnosis not present

## 2022-06-23 DIAGNOSIS — M6281 Muscle weakness (generalized): Secondary | ICD-10-CM | POA: Diagnosis not present

## 2022-06-23 DIAGNOSIS — M25561 Pain in right knee: Secondary | ICD-10-CM

## 2022-06-23 DIAGNOSIS — M25661 Stiffness of right knee, not elsewhere classified: Secondary | ICD-10-CM | POA: Diagnosis not present

## 2022-06-23 NOTE — Therapy (Signed)
OUTPATIENT PHYSICAL THERAPY LOWER EXTREMITY TREATMENT   Patient Name: [name] MRN: [id number] DOB:04-22-1945, 77 y.o., female Today's Date: 06/23/2022  END OF SESSION:  PT End of Session - 06/23/22 1303     Visit Number 2    Number of Visits 24    Date for PT Re-Evaluation 09/08/22    Authorization Type Medicare A&B and [employer]BS Fed emp    Progress Note Due on Visit 10    PT Start Time 1301    PT Stop Time 1356    PT Time Calculation (min) 55 min    Activity Tolerance Patient tolerated treatment well;Patient limited by pain;No increased pain    Behavior During Therapy [facility] for tasks assessed/performed              Past Medical History:  Diagnosis Date   Anemia    Congenital musculoskeletal deformity of spine    Depression    Headache    Hypertension    Incontinence of bowel 09/2013   Lumbago    Lumbosacral spondylosis without myelopathy    Sleep apnea    Sleep disorder    uses gabapentin at bedtime    Spinal stenosis, lumbar region, without neurogenic claudication    Past Surgical History:  Procedure Laterality Date   ANKLE SURGERY Right 2015   lengthened the achilles    ANTERIOR HIP REVISION Right 10/07/2020   Procedure: RIGHT HIP REVISION OF  HIP BALL/POLY-LINER;  Surgeon: Kathryne Hitch, MD;  Location: [facility] OR;  Service: Orthopedics;  Laterality: Right;  RNFA PLEASE   AUGMENTATION MAMMAPLASTY Bilateral 2000 or 2001 unsure    Left Implant has busted and is no longer visible   BACK SURGERY  2015   fusion , Dr Noel Gerold at [facility]; has 2 metal rods in place , fusion from t2 to s1    BACK SURGERY  01/2017   Dr Sharolyn Douglas ; repair of cracked titanium rod in back lumbar    BREAST EXCISIONAL BIOPSY Left    30 yrs ago   bunionectomy  Bilateral 1980s   multiple    COLONOSCOPY     with polypectomy ; q 5 years    FOOT SURGERY  07/2011   SHOULDER SURGERY Left 2013   rotator cuff    TOTAL HIP ARTHROPLASTY Right 05/08/2017   Procedure: RIGHT  TOTAL HIP ARTHROPLASTY ANTERIOR APPROACH;  Surgeon: Durene Romans, MD;  Location: [facility];  Service: Orthopedics;  Laterality: Right;  70 mins   TOTAL KNEE ARTHROPLASTY Right 06/06/2022   Procedure: RIGHT TOTAL KNEE ARTHROPLASTY;  Surgeon: Kathryne Hitch, MD;  Location: [facility] OR;  Service: Orthopedics;  Laterality: Right;   Patient Active Problem List   Diagnosis Date Noted   Status post total right knee replacement 06/06/2022   Leg length discrepancy, Right 10/07/2020   History of revision of total replacement of right hip joint 10/07/2020   Multinodular goiter 05/18/2020   Hip contracture, unspecified laterality 07/02/2018   Overweight (BMI 25.0-29.9) 05/09/2017   S/P right THA, AA 05/08/2017   Chronic bilateral low back pain without sciatica 04/05/2015   Disorder of sacroiliac joint 12/22/2014   Chronic low back pain 12/22/2014   Acquired scoliosis 11/27/2011   Lumbosacral spondylosis without myelopathy 05/22/2011    PCP: Jarrett Soho, MD  REFERRING PROVIDER: Kathryne Hitch, MD  REFERRING DIAG:  [id number] (ICD-10-CM) - Status post total right knee replacement  M17.11 (ICD-10-CM) - Unilateral primary osteoarthritis, right knee   THERAPY DIAG:  Acute pain  of right knee  Stiffness of right knee, not elsewhere classified  Muscle weakness (generalized)  Other abnormalities of gait and mobility  Unsteadiness on feet  Localized edema  Rationale for Evaluation and Treatment: Rehabilitation  ONSET DATE: 06/06/2022 TKA  SUBJECTIVE:   SUBJECTIVE STATEMENT: Allyson reports early HEP compliance although she had several questions with her program that were addressed today.   PERTINENT HISTORY: Right TKA 06/06/2022, OA, leg length discrepancy, right Total hip replacement anterior approach with revision 2022, hip contracture, chronic LBP with surgery, acquired scoliosis, HTN, ankle surgery lengthen Achilles,   PAIN:  NPRS scale: 3-10/10 this week Pain location:  right knee along joint line Pain description: at rest dull ache and intermittent spasm & sharp pain Aggravating factors: up too long, first moving if knee has gotten stiffen Relieving factors: Oxy, muscle relax & ice  PRECAUTIONS: Fall  WEIGHT BEARING RESTRICTIONS: No  FALLS:  Has patient fallen in last 6 months? No  LIVING ENVIRONMENT: Lives with: lives with their spouse and 2 dogs 12-14# Lives in: [address] house with master bedroom & bathroom main level Stairs: Yes: Internal: 16-18 steps; on left going up and External: 3 steps; none Has following equipment at home: Single point cane, Environmental consultant - 2 wheeled, Marine scientist  OCCUPATION: retired from Engineer, civil (consulting),   PLOF: Independent  PATIENT GOALS:  walk & be active throughout her day, go upstairs to sewing room, sewing machine uses RLE  Next MD visit: 07/17/2022  OBJECTIVE:  DIAGNOSTIC FINDINGS: 06/06/2022 post op X-ray showed right knee arthroplasty without immediate postoperative complication.  PATIENT SURVEYS:  FOTO intake:  29%  predicted:  57%  COGNITION: Overall cognitive status: WFL    SENSATION: WFL  EDEMA:  Circumferential:  LLE: above knee 43cm,  around knee 39.5cm, below knee 35 cm RLE: above knee 45.7cm,  around knee 40.8cm, below knee 38.9 cm  POSTURE: rounded shoulders, forward head, flexed trunk , and weight shift left  PALPATION: Right knee tenderness along joint line, right ITB tightness & tenderness  LOWER EXTREMITY ROM:   ROM Right eval Right 06/23/2022  Hip flexion    Hip extension    Hip abduction    Hip adduction    Hip internal rotation    Hip external rotation    Knee flexion Seated A:84* P: 93* Active 98  Knee extension Supine P: -7* Active -4  Ankle dorsiflexion    Ankle plantarflexion    Ankle inversion    Ankle eversion     (Blank rows = not tested)  LOWER EXTREMITY MMT:  MMT Right eval Left eval  Hip flexion    Hip extension    Hip abduction    Hip adduction    Hip  internal rotation    Hip external rotation    Knee flexion 3-/5   Knee extension 3-/5   Ankle dorsiflexion    Ankle plantarflexion    Ankle inversion    Ankle eversion     (Blank rows = not tested)   FUNCTIONAL TESTS:  22 inch chair transfer: requires use of BUEs on armrest, RLE extended with minimal assistance and requires RW for stabilization  GAIT: Distance walked: 100' Assistive device utilized: Environmental consultant - 2 wheeled Level of assistance: SBA Comments: RLE decreased stance duration, abduction, right knee flexed in stance with minimal increase in flexion for swing.    TODAY'S TREATMENT  DATE: 06/23/2022 Seated knee flexion/extension AROM 10 x 5 seconds Seated hamstrings stretch with strap 3 x 20 seconds Seated long arc quad 10 x 5 seconds Discussed seated straight leg raises; supine short arc quad; ankle alphabet and supine leg raises Quadriceps sets 2 sets of 10 for 5 seconds Seated knee flexion with belt 5 x 10 seconds Seated knee flexion AAROM (left pushes right into flexion) 5 x 10 seconds Supine ITB stretch (hamstrings stretch with adduction and IR) 3 x 20 seconds  Vaso right knee 10 minutes Medium 34*   06/21/2022: Therex:  HEP instruction/performance c cues for techniques, handout provided.  Trial set performed of each for comprehension and symptom assessment.  See below for exercise list PT recommended elevation >/= 15 min >/= 2x/day.   PATIENT EDUCATION:  Education details: HEP, POC Person educated: Patient Education method: Programmer, multimedia, Demonstration, Verbal cues, and Handouts Education comprehension: verbalized understanding, returned demonstration, and verbal cues required  HOME EXERCISE PROGRAM: Access Code: [email or web address] URL: [email or web address] Date: 06/21/2022 Prepared by: Vladimir Faster  Exercises - Ankle Alphabet in Elevation  - 2-4 x daily - 7 x weekly - 1 sets - 1  reps - supine quad set with towel roll under ankle  - 2-3 x daily - 7 x weekly - 2-3 sets - 10 reps - 5 seconds hold - Supine Heel Slide with Strap  - 2-3 x daily - 7 x weekly - 2-3 sets - 10 reps - 5 seconds hold - Supine Knee Extension Strengthening  - 2-3 x daily - 7 x weekly - 2-3 sets - 10 reps - 5 seconds hold - Supine Straight Leg Raises  - 2-3 x daily - 7 x weekly - 2-3 sets - 10 reps - 5 seconds hold - Seated Knee Flexion Extension AROM   - 2-4 x daily - 7 x weekly - 2-3 sets - 10 reps - 5 seconds hold - Seated straight leg lifts  - 2-3 x daily - 7 x weekly - 2-3 sets - 10 reps - 5 seconds hold - Seated Hamstring Stretch with Strap  - 2-4 x daily - 7 x weekly - 1 sets - 3 reps - 20-30 seconds hold - Seated Long Arc Quad  - 2-4 x daily - 7 x weekly - 2-3 sets - 10 reps - 5 seconds hold  ASSESSMENT: CLINICAL IMPRESSION: Shenekia did a good job with her exercises during today's visit.  AROM was -4 - 0 - 98 today (was -7 - 0 - 84 at evaluation).  Knee active range of motion, edema control and quadriceps strengthening remain the early emphasis with her clinic and home program.  Continued work is recommended to meet long-term goals.  OBJECTIVE IMPAIRMENTS: Abnormal gait, decreased activity tolerance, decreased balance, decreased endurance, decreased knowledge of condition, decreased knowledge of use of DME, decreased mobility, difficulty walking, decreased ROM, decreased strength, increased edema, increased muscle spasms, postural dysfunction, and pain.   ACTIVITY LIMITATIONS: carrying, lifting, bending, sitting, standing, squatting, sleeping, stairs, transfers, bed mobility, and locomotion level  PARTICIPATION LIMITATIONS: meal prep, cleaning, laundry, driving, and community activity  PERSONAL FACTORS: Age, Fitness, Past/current experiences, Time since onset of injury/illness/exacerbation, and 3+ comorbidities: see PMH  are also affecting patient's functional outcome.   REHAB POTENTIAL:  Good  CLINICAL DECISION MAKING: Stable/uncomplicated  EVALUATION COMPLEXITY: Low   GOALS: Goals reviewed with patient? Yes  SHORT TERM GOALS: (target date for Short term goals 07/21/2022)   1.  Patient will demonstrate independent use of home exercise  program to maintain progress from in clinic treatments.  Goal status: On Going 06/23/2022  2. PROM right knee ext -2* and flexion 100* Goal status: New  3. Interim FOTO >/= 40%  Goal status: On Going 06/23/2022  LONG TERM GOALS: (target dates for all long term goals  09/08/2022 )   1. Patient will demonstrate/report pain at worst less than or equal to 2/10 to facilitate minimal limitation in daily activity secondary to pain symptoms.  Goal status: New   2. Patient will demonstrate independent use of home exercise program to facilitate ability to maintain/progress functional gains from skilled physical therapy services.  Goal status: New   3. Patient will demonstrate FOTO outcome > or = 57 % to indicate reduced disability due to condition.  Goal status: New   4.  Patient will demonstrate right knee MMT >/= 4/5 throughout to faciltiate usual transfers, stairs, squatting at [facility] for daily life.   Goal status: New   5.  Patient AROM right knee ext 0* and flexion 100* Goal status: New   6.  patient amb >500' & neg ramp/curb with LRAD modified independent.  Goal status: New    PLAN:  PT FREQUENCY:  2x/week  PT DURATION: 12 weeks  PLANNED INTERVENTIONS: Therapeutic exercises, Therapeutic activity, Neuro Muscular re-education, Balance training, Gait training, Patient/Family education, Joint mobilization, Stair training, DME instructions, Dry Needling, Electrical stimulation, Traction, Cryotherapy, vasopneumatic deviceMoist heat, Taping, Ultrasound, Ionotophoresis 4mg /ml Dexamethasone, and aquatic therapy, Manual therapy.  All included unless contraindicated  PLAN FOR NEXT SESSION: Review HEP & update HEP including ITB stretch,  manual therapy & exercise for range, vaso for edema.    Cherlyn Cushing, PT, MPT 06/23/2022, 4:26 PM

## 2022-06-26 ENCOUNTER — Ambulatory Visit (INDEPENDENT_AMBULATORY_CARE_PROVIDER_SITE_OTHER): Payer: Medicare Other | Admitting: Physical Therapy

## 2022-06-26 ENCOUNTER — Encounter: Payer: Self-pay | Admitting: Physical Therapy

## 2022-06-26 DIAGNOSIS — R2689 Other abnormalities of gait and mobility: Secondary | ICD-10-CM | POA: Diagnosis not present

## 2022-06-26 DIAGNOSIS — M6281 Muscle weakness (generalized): Secondary | ICD-10-CM | POA: Diagnosis not present

## 2022-06-26 DIAGNOSIS — R600 Localized edema: Secondary | ICD-10-CM

## 2022-06-26 DIAGNOSIS — M25561 Pain in right knee: Secondary | ICD-10-CM | POA: Diagnosis not present

## 2022-06-26 DIAGNOSIS — R2681 Unsteadiness on feet: Secondary | ICD-10-CM | POA: Diagnosis not present

## 2022-06-26 DIAGNOSIS — M25661 Stiffness of right knee, not elsewhere classified: Secondary | ICD-10-CM | POA: Diagnosis not present

## 2022-06-26 NOTE — Therapy (Signed)
OUTPATIENT PHYSICAL THERAPY LOWER EXTREMITY TREATMENT   Patient Name: [name] MRN: [id number] DOB:[birth year], 77 y.o., female Today's Date: 06/26/2022  END OF SESSION:  PT End of Session - 06/26/22 1348     Visit Number 3    Number of Visits 24    Date for PT Re-Evaluation 09/08/22    Authorization Type Medicare A&B and [employer] Fed emp    Progress Note Due on Visit 10    PT Start Time 1345    PT Stop Time 1428    PT Time Calculation (min) 43 min    Activity Tolerance Patient tolerated treatment well;Patient limited by pain;No increased pain    Behavior During Therapy [facility] for tasks assessed/performed              Past Medical History:  Diagnosis Date   Anemia    Congenital musculoskeletal deformity of spine    Depression    Headache    Hypertension    Incontinence of bowel 09/2013   Lumbago    Lumbosacral spondylosis without myelopathy    Sleep apnea    Sleep disorder    uses gabapentin at bedtime    Spinal stenosis, lumbar region, without neurogenic claudication    Past Surgical History:  Procedure Laterality Date   ANKLE SURGERY Right 2015   lengthened the achilles    ANTERIOR HIP REVISION Right 10/07/2020   Procedure: RIGHT HIP REVISION OF  HIP BALL/POLY-LINER;  Surgeon: Kathryne Hitch, MD;  Location: [facility] OR;  Service: Orthopedics;  Laterality: Right;  RNFA PLEASE   AUGMENTATION MAMMAPLASTY Bilateral 2000 or 2001 unsure    Left Implant has busted and is no longer visible   BACK SURGERY  2015   fusion , Dr Noel Gerold at [facility]; has 2 metal rods in place , fusion from t2 to s1    BACK SURGERY  01/2017   Dr Sharolyn Douglas ; repair of cracked titanium rod in back lumbar    BREAST EXCISIONAL BIOPSY Left    30 yrs ago   bunionectomy  Bilateral 1980s   multiple    COLONOSCOPY     with polypectomy ; q 5 years    FOOT SURGERY  07/2011   SHOULDER SURGERY Left 2013   rotator cuff    TOTAL HIP ARTHROPLASTY Right 05/08/2017   Procedure: RIGHT  TOTAL HIP ARTHROPLASTY ANTERIOR APPROACH;  Surgeon: Durene Romans, MD;  Location: [facility];  Service: Orthopedics;  Laterality: Right;  70 mins   TOTAL KNEE ARTHROPLASTY Right 06/06/2022   Procedure: RIGHT TOTAL KNEE ARTHROPLASTY;  Surgeon: Kathryne Hitch, MD;  Location: [facility] OR;  Service: Orthopedics;  Laterality: Right;   Patient Active Problem List   Diagnosis Date Noted   Status post total right knee replacement 06/06/2022   Leg length discrepancy, Right 10/07/2020   History of revision of total replacement of right hip joint 10/07/2020   Multinodular goiter 05/18/2020   Hip contracture, unspecified laterality 07/02/2018   Overweight (BMI 25.0-29.9) 05/09/2017   S/P right THA, AA 05/08/2017   Chronic bilateral low back pain without sciatica 04/05/2015   Disorder of sacroiliac joint 12/22/2014   Chronic low back pain 12/22/2014   Acquired scoliosis 11/27/2011   Lumbosacral spondylosis without myelopathy 05/22/2011    PCP: Jarrett Soho, MD  REFERRING PROVIDER: Kathryne Hitch, MD  REFERRING DIAG:  [phone] (ICD-10-CM) - Status post total right knee replacement  M17.11 (ICD-10-CM) - Unilateral primary osteoarthritis, right knee   THERAPY DIAG:  Acute pain  of right knee  Stiffness of right knee, not elsewhere classified  Muscle weakness (generalized)  Other abnormalities of gait and mobility  Unsteadiness on feet  Localized edema  Rationale for Evaluation and Treatment: Rehabilitation  ONSET DATE: 06/06/2022 TKA  SUBJECTIVE:   SUBJECTIVE STATEMENT: She continues to do her HEP. Her left knee is still effecting her mobility.   PERTINENT HISTORY: Right TKA 06/06/2022, OA, leg length discrepancy, right Total hip replacement anterior approach with revision 2022, hip contracture, chronic LBP with surgery, acquired scoliosis, HTN, ankle surgery lengthen Achilles,   PAIN:  NPRS scale: today 5/10 and since last PT  2/10 - 7/10 Pain location: right knee along  joint line Pain description: at rest dull ache and intermittent spasm & sharp pain Aggravating factors: up too long, first moving if knee has gotten stiffen Relieving factors: Oxy, muscle relax & ice  PRECAUTIONS: Fall  WEIGHT BEARING RESTRICTIONS: No  FALLS:  Has patient fallen in last 6 months? No  LIVING ENVIRONMENT: Lives with: lives with their spouse and 2 dogs 12-14# Lives in: [address] house with master bedroom & bathroom main level Stairs: Yes: Internal: 16-18 steps; on left going up and External: 3 steps; none Has following equipment at home: Single point cane, Environmental consultant - 2 wheeled, Marine scientist  OCCUPATION: retired from Engineer, [employer] (consulting),   PLOF: Independent  PATIENT GOALS:  walk & be active throughout her day, go upstairs to sewing room, sewing machine uses RLE  Next MD visit: 07/17/2022  OBJECTIVE:  DIAGNOSTIC FINDINGS: 06/06/2022 post op X-ray showed right knee arthroplasty without immediate postoperative complication.  PATIENT SURVEYS:  FOTO intake:  29%  predicted:  57%  COGNITION: Overall cognitive status: WFL    SENSATION: WFL  EDEMA:  Circumferential:  LLE: above knee 43cm,  around knee 39.5cm, below knee 35 cm RLE: above knee 45.7cm,  around knee 40.8cm, below knee 38.9 cm  POSTURE: rounded shoulders, forward head, flexed trunk , and weight shift left  PALPATION: Right knee tenderness along joint line, right ITB tightness & tenderness  LOWER EXTREMITY ROM:   ROM Right eval Right 06/23/22  Hip flexion    Hip extension    Hip abduction    Hip adduction    Hip internal rotation    Hip external rotation    Knee flexion Seated A:84* P: 93* Active 98  Knee extension Supine P: -7* Active -4  Ankle dorsiflexion    Ankle plantarflexion    Ankle inversion    Ankle eversion     (Blank rows = not tested)  LOWER EXTREMITY MMT:  MMT Right eval  Hip flexion   Hip extension   Hip abduction   Hip adduction   Hip internal rotation   Hip external  rotation   Knee flexion 3-/5  Knee extension 3-/5  Ankle dorsiflexion   Ankle plantarflexion   Ankle inversion   Ankle eversion    (Blank rows = not tested)   FUNCTIONAL TESTS:  22 inch chair transfer: requires use of BUEs on armrest, RLE extended with minimal assistance and requires RW for stabilization  GAIT: Distance walked: 100' Assistive device utilized: Environmental consultant - 2 wheeled Level of assistance: SBA Comments: RLE decreased stance duration, abduction, right knee flexed in stance with minimal increase in flexion for swing.    TODAY'S TREATMENT  DATE: 06/26/2022: Therapeutic Exercise: SciFit bike with BLEs / BUEs seat 11 level 1 full revolutions for knee range 8 min Gastroc stretch on incline board 30 sec hold 2 reps Heel raises on incline board 10 reps light BUE support Tandem stance on foam beam 30 sec RLE in front & in back with intermittent touch Step up on 4" step active stepping and RLE concentric & eccentric control 5 reps RUE support & 5 reps LUE support Standing with posterior pelvis to counter height support (RW in front for safety) working on upright posture with upper body over lower body / feet. Pt verbalized how to work on this & why at home.  In above position RLE TKE green theraband with RW support 10 reps 2 sets Sit to /from stand 24" bar stool without UE assist 10 reps Bridge 10 reps Supine SAQ 2# 10 reps 2 sets  Self-care: PT demo & verbal cues on using 24" bar stool for modified standing for ADLs in kitchen & bathroom. Pt verbalized understanding.   Manual Therapy PROM with overpressure for knee flexion seated  Vaso right knee elevation for 10 minutes Medium compression 34*   06/23/2022 Seated knee flexion/extension AROM 10 x 5 seconds Seated hamstrings stretch with strap 3 x 20 seconds Seated long arc quad 10 x 5 seconds Discussed seated straight leg raises; supine short arc quad;  ankle alphabet and supine leg raises Quadriceps sets 2 sets of 10 for 5 seconds Seated knee flexion with belt 5 x 10 seconds Seated knee flexion AAROM (left pushes right into flexion) 5 x 10 seconds Supine ITB stretch (hamstrings stretch with adduction and IR) 3 x 20 seconds  Vaso right knee 10 minutes Medium 34*   06/21/2022: Therex:  HEP instruction/performance c cues for techniques, handout provided.  Trial set performed of each for comprehension and symptom assessment.  See below for exercise list PT recommended elevation >/= 15 min >/= 2x/day.   PATIENT EDUCATION:  Education details: HEP, POC Person educated: Patient Education method: Programmer, multimedia, Demonstration, Verbal cues, and Handouts Education comprehension: verbalized understanding, returned demonstration, and verbal cues required  HOME EXERCISE PROGRAM: Access Code: [email or web address] URL: [email or web address] Date: 06/21/2022 Prepared by: Vladimir Faster  Exercises - Ankle Alphabet in Elevation  - 2-4 x daily - 7 x weekly - 1 sets - 1 reps - supine quad set with towel roll under ankle  - 2-3 x daily - 7 x weekly - 2-3 sets - 10 reps - 5 seconds hold - Supine Heel Slide with Strap  - 2-3 x daily - 7 x weekly - 2-3 sets - 10 reps - 5 seconds hold - Supine Knee Extension Strengthening  - 2-3 x daily - 7 x weekly - 2-3 sets - 10 reps - 5 seconds hold - Supine Straight Leg Raises  - 2-3 x daily - 7 x weekly - 2-3 sets - 10 reps - 5 seconds hold - Seated Knee Flexion Extension AROM   - 2-4 x daily - 7 x weekly - 2-3 sets - 10 reps - 5 seconds hold - Seated straight leg lifts  - 2-3 x daily - 7 x weekly - 2-3 sets - 10 reps - 5 seconds hold - Seated Hamstring Stretch with Strap  - 2-4 x daily - 7 x weekly - 1 sets - 3 reps - 20-30 seconds hold - Seated Long Arc Quad  - 2-4 x daily - 7 x weekly - 2-3 sets - 10 reps - 5 seconds hold  ASSESSMENT: CLINICAL IMPRESSION: Patient seems to understand standing upright with counter  behind her and use of bar stool to begin to improve standing tolerance.  Pt responded well to slow progression of exercises today. Pt continues to benefit from skilled PT.   OBJECTIVE IMPAIRMENTS: Abnormal gait, decreased activity tolerance, decreased balance, decreased endurance, decreased knowledge of condition, decreased knowledge of use of DME, decreased mobility, difficulty walking, decreased ROM, decreased strength, increased edema, increased muscle spasms, postural dysfunction, and pain.   ACTIVITY LIMITATIONS: carrying, lifting, bending, sitting, standing, squatting, sleeping, stairs, transfers, bed mobility, and locomotion level  PARTICIPATION LIMITATIONS: meal prep, cleaning, laundry, driving, and community activity  PERSONAL FACTORS: Age, Fitness, Past/current experiences, Time since onset of injury/illness/exacerbation, and 3+ comorbidities: see PMH  are also affecting patient's functional outcome.   REHAB POTENTIAL: Good  CLINICAL DECISION MAKING: Stable/uncomplicated  EVALUATION COMPLEXITY: Low   GOALS: Goals reviewed with patient? Yes  SHORT TERM GOALS: (target date for Short term goals 07/21/2022)   1.  Patient will demonstrate independent use of home exercise program to maintain progress from in clinic treatments.  Goal status: On Going 06/23/2022  2. PROM right knee ext -2* and flexion 100* Goal status: New  3. Interim FOTO >/= 40%  Goal status: On Going 06/23/2022  LONG TERM GOALS: (target dates for all long term goals  09/08/2022 )   1. Patient will demonstrate/report pain at worst less than or equal to 2/10 to facilitate minimal limitation in daily activity secondary to pain symptoms.  Goal status: New   2. Patient will demonstrate independent use of home exercise program to facilitate ability to maintain/progress functional gains from skilled physical therapy services.  Goal status: New   3. Patient will demonstrate FOTO outcome > or = 57 % to indicate  reduced disability due to condition.  Goal status: New   4.  Patient will demonstrate right knee MMT >/= 4/5 throughout to faciltiate usual transfers, stairs, squatting at [facility] for daily life.   Goal status: New   5.  Patient AROM right knee ext 0* and flexion 100* Goal status: New   6.  patient amb >500' & neg ramp/curb with LRAD modified independent.  Goal status: New    PLAN:  PT FREQUENCY:  2x/week  PT DURATION: 12 weeks  PLANNED INTERVENTIONS: Therapeutic exercises, Therapeutic activity, Neuro Muscular re-education, Balance training, Gait training, Patient/Family education, Joint mobilization, Stair training, DME instructions, Dry Needling, Electrical stimulation, Traction, Cryotherapy, vasopneumatic deviceMoist heat, Taping, Ultrasound, Ionotophoresis 4mg /ml Dexamethasone, and aquatic therapy, Manual therapy.  All included unless contraindicated  PLAN FOR NEXT SESSION: check range, add ITB stretch, continue with manual therapy & exercise for range, add standing balance & functional activities, vaso for edema.     Vladimir Faster, PT, DPT 06/26/2022, 4:31 PM

## 2022-06-28 ENCOUNTER — Ambulatory Visit (INDEPENDENT_AMBULATORY_CARE_PROVIDER_SITE_OTHER): Payer: Medicare Other | Admitting: Physical Therapy

## 2022-06-28 ENCOUNTER — Encounter: Payer: Self-pay | Admitting: Physical Therapy

## 2022-06-28 DIAGNOSIS — R2681 Unsteadiness on feet: Secondary | ICD-10-CM

## 2022-06-28 DIAGNOSIS — M25561 Pain in right knee: Secondary | ICD-10-CM

## 2022-06-28 DIAGNOSIS — M6281 Muscle weakness (generalized): Secondary | ICD-10-CM

## 2022-06-28 DIAGNOSIS — R600 Localized edema: Secondary | ICD-10-CM

## 2022-06-28 DIAGNOSIS — R2689 Other abnormalities of gait and mobility: Secondary | ICD-10-CM

## 2022-06-28 DIAGNOSIS — M25661 Stiffness of right knee, not elsewhere classified: Secondary | ICD-10-CM

## 2022-06-28 NOTE — Therapy (Signed)
OUTPATIENT PHYSICAL THERAPY LOWER EXTREMITY TREATMENT   Patient Name: [name] MRN: [id number] DOB:[birth year], 77 y.o., female Today's Date: 06/28/2022  END OF SESSION:  PT End of Session - 06/28/22 1353     Visit Number 4    Number of Visits 24    Date for PT Re-Evaluation 09/08/22    Authorization Type Medicare A&B and [employer] Fed emp    Progress Note Due on Visit 10    PT Start Time 1350    PT Stop Time 1440    PT Time Calculation (min) 50 min    Activity Tolerance Patient tolerated treatment well;Patient limited by pain;No increased pain    Behavior During Therapy [facility] for tasks assessed/performed               Past Medical History:  Diagnosis Date   Anemia    Congenital musculoskeletal deformity of spine    Depression    Headache    Hypertension    Incontinence of bowel 09/2013   Lumbago    Lumbosacral spondylosis without myelopathy    Sleep apnea    Sleep disorder    uses gabapentin at bedtime    Spinal stenosis, lumbar region, without neurogenic claudication    Past Surgical History:  Procedure Laterality Date   ANKLE SURGERY Right 2015   lengthened the achilles    ANTERIOR HIP REVISION Right 10/07/2020   Procedure: RIGHT HIP REVISION OF  HIP BALL/POLY-LINER;  Surgeon: Kathryne Hitch, MD;  Location: [facility] OR;  Service: Orthopedics;  Laterality: Right;  RNFA PLEASE   AUGMENTATION MAMMAPLASTY Bilateral 2000 or 2001 unsure    Left Implant has busted and is no longer visible   BACK SURGERY  2015   fusion , Dr Noel Gerold at [facility]; has 2 metal rods in place , fusion from t2 to s1    BACK SURGERY  01/2017   Dr Sharolyn Douglas ; repair of cracked titanium rod in back lumbar    BREAST EXCISIONAL BIOPSY Left    30 yrs ago   bunionectomy  Bilateral 1980s   multiple    COLONOSCOPY     with polypectomy ; q 5 years    FOOT SURGERY  07/2011   SHOULDER SURGERY Left 2013   rotator cuff    TOTAL HIP ARTHROPLASTY Right 05/08/2017   Procedure: RIGHT  TOTAL HIP ARTHROPLASTY ANTERIOR APPROACH;  Surgeon: Durene Romans, MD;  Location: [facility];  Service: Orthopedics;  Laterality: Right;  70 mins   TOTAL KNEE ARTHROPLASTY Right 06/06/2022   Procedure: RIGHT TOTAL KNEE ARTHROPLASTY;  Surgeon: Kathryne Hitch, MD;  Location: [facility] OR;  Service: Orthopedics;  Laterality: Right;   Patient Active Problem List   Diagnosis Date Noted   Status post total right knee replacement 06/06/2022   Leg length discrepancy, Right 10/07/2020   History of revision of total replacement of right hip joint 10/07/2020   Multinodular goiter 05/18/2020   Hip contracture, unspecified laterality 07/02/2018   Overweight (BMI 25.0-29.9) 05/09/2017   S/P right THA, AA 05/08/2017   Chronic bilateral low back pain without sciatica 04/05/2015   Disorder of sacroiliac joint 12/22/2014   Chronic low back pain 12/22/2014   Acquired scoliosis 11/27/2011   Lumbosacral spondylosis without myelopathy 05/22/2011    PCP: Jarrett Soho, MD  REFERRING PROVIDER: Kathryne Hitch, MD  REFERRING DIAG:  [phone] (ICD-10-CM) - Status post total right knee replacement  M17.11 (ICD-10-CM) - Unilateral primary osteoarthritis, right knee   THERAPY DIAG:  Acute  pain of right knee  Stiffness of right knee, not elsewhere classified  Muscle weakness (generalized)  Other abnormalities of gait and mobility  Unsteadiness on feet  Localized edema  Rationale for Evaluation and Treatment: Rehabilitation  ONSET DATE: 06/06/2022 TKA  SUBJECTIVE:   SUBJECTIVE STATEMENT: She continues to have issues with ability to sleep.    PERTINENT HISTORY: Right TKA 06/06/2022, OA, leg length discrepancy, right Total hip replacement anterior approach with revision 2022, hip contracture, chronic LBP with surgery, acquired scoliosis, HTN, ankle surgery lengthen Achilles,   PAIN:  NPRS scale: today  7/10 and since last PT  2/10 - 7/10 Pain location: right knee along joint line Pain  description: at rest dull ache and intermittent spasm & sharp pain Aggravating factors: up too long, first moving if knee has gotten stiffen Relieving factors: Oxy, muscle relax & ice  PRECAUTIONS: Fall  WEIGHT BEARING RESTRICTIONS: No  FALLS:  Has patient fallen in last 6 months? No  LIVING ENVIRONMENT: Lives with: lives with their spouse and 2 dogs 12-14# Lives in: [address] house with master bedroom & bathroom main level Stairs: Yes: Internal: 16-18 steps; on left going up and External: 3 steps; none Has following equipment at home: Single point cane, Environmental consultant - 2 wheeled, Marine scientist  OCCUPATION: retired from Engineer, [employer] (consulting),   PLOF: Independent  PATIENT GOALS:  walk & be active throughout her day, go upstairs to sewing room, sewing machine uses RLE  Next MD visit: 07/17/2022  OBJECTIVE:  DIAGNOSTIC FINDINGS: 06/06/2022 post op X-ray showed right knee arthroplasty without immediate postoperative complication.  PATIENT SURVEYS:  FOTO intake:  29%  predicted:  57%  COGNITION: Overall cognitive status: WFL    SENSATION: WFL  EDEMA:  Circumferential:  LLE: above knee 43cm,  around knee 39.5cm, below knee 35 cm RLE: above knee 45.7cm,  around knee 40.8cm, below knee 38.9 cm  POSTURE: rounded shoulders, forward head, flexed trunk , and weight shift left  PALPATION: Right knee tenderness along joint line, right ITB tightness & tenderness  LOWER EXTREMITY ROM:   ROM Right eval Right 06/23/22 Right 06/28/22  Hip flexion     Hip extension     Hip abduction     Hip adduction     Hip internal rotation     Hip external rotation     Knee flexion Seated A:84* P: 93* Active 98 Seated A: 100* P: 104*  Knee extension Supine P: -7* Active -4 Standing A: -4*  Ankle dorsiflexion     Ankle plantarflexion     Ankle inversion     Ankle eversion      (Blank rows = not tested)  LOWER EXTREMITY MMT:  MMT Right eval  Hip flexion   Hip extension   Hip abduction   Hip  adduction   Hip internal rotation   Hip external rotation   Knee flexion 3-/5  Knee extension 3-/5  Ankle dorsiflexion   Ankle plantarflexion   Ankle inversion   Ankle eversion    (Blank rows = not tested)   FUNCTIONAL TESTS:  22 inch chair transfer: requires use of BUEs on armrest, RLE extended with minimal assistance and requires RW for stabilization  GAIT: Distance walked: 100' Assistive device utilized: Environmental consultant - 2 wheeled Level of assistance: SBA Comments: RLE decreased stance duration, abduction, right knee flexed in stance with minimal increase in flexion for swing.    TODAY'S TREATMENT  DATE: 06/28/2022: Therapeutic Exercise: SciFit bike with BLEs / BUEs seat 10 level 1 full revolutions for knee range 8 min Gastroc stretch on incline board 30 sec hold 2 reps; Heel raises on incline board 10 reps light BUE support Tandem stance on foam beam 30 sec RLE in front & in back with intermittent touch. PT demo & verbal cues on how to do at home bw sink & chair back on floor initially. Pt verbalized understanding.  Standing with posterior pelvis to counter height support (RW in front for safety) working on upright posture with upper body over lower body / feet. Pt verbalized how to work on this & why at home.  In above position RLE TKE green theraband with RW support 10 reps 2 sets Gait with RW working on stance knee ext LLE. Seated LAQ 2# BLEs and active knee flexion with contralateral LE opposing motion 3 sec 10 reps 2 sets  Self-care: PT demo & verbal cues on positioning in bed with pillow bw LEs & 2 folded pillows "tenting" off feet. Pt verbalized understanding.   Manual Therapy PROM with overpressure for knee flexion seated PROM with overpressure for knee ext  Vaso right knee elevation for 10 minutes Medium compression 34*  06/26/2022: Therapeutic Exercise: SciFit bike with BLEs / BUEs seat 11 level  1 full revolutions for knee range 8 min Gastroc stretch on incline board 30 sec hold 2 reps Heel raises on incline board 10 reps light BUE support Tandem stance on foam beam 30 sec RLE in front & in back with intermittent touch Step up on 4" step active stepping and RLE concentric & eccentric control 5 reps RUE support & 5 reps LUE support Standing with posterior pelvis to counter height support (RW in front for safety) working on upright posture with upper body over lower body / feet. Pt verbalized how to work on this & why at home.  In above position RLE TKE green theraband with RW support 10 reps 2 sets Sit to /from stand 24" bar stool without UE assist 10 reps Bridge 10 reps Supine SAQ 2# 10 reps 2 sets  Self-care: PT demo & verbal cues on using 24" bar stool for modified standing for ADLs in kitchen & bathroom. Pt verbalized understanding.   Manual Therapy PROM with overpressure for knee flexion seated  Vaso right knee elevation for 10 minutes Medium compression 34*   06/23/2022 Seated knee flexion/extension AROM 10 x 5 seconds Seated hamstrings stretch with strap 3 x 20 seconds Seated long arc quad 10 x 5 seconds Discussed seated straight leg raises; supine short arc quad; ankle alphabet and supine leg raises Quadriceps sets 2 sets of 10 for 5 seconds Seated knee flexion with belt 5 x 10 seconds Seated knee flexion AAROM (left pushes right into flexion) 5 x 10 seconds Supine ITB stretch (hamstrings stretch with adduction and IR) 3 x 20 seconds  Vaso right knee 10 minutes Medium 34*   PATIENT EDUCATION:  Education details: HEP, POC Person educated: Patient Education method: Programmer, multimedia, Demonstration, Verbal cues, and Handouts Education comprehension: verbalized understanding, returned demonstration, and verbal cues required  HOME EXERCISE PROGRAM: Access Code: [email or web address] URL: [email or web address] Date: 06/21/2022 Prepared by: Vladimir Faster  Exercises -  Ankle Alphabet in Elevation  - 2-4 x daily - 7 x weekly - 1 sets - 1 reps - supine quad set with towel roll under ankle  - 2-3 x daily - 7 x weekly - 2-3 sets - 10 reps - 5  seconds hold - Supine Heel Slide with Strap  - 2-3 x daily - 7 x weekly - 2-3 sets - 10 reps - 5 seconds hold - Supine Knee Extension Strengthening  - 2-3 x daily - 7 x weekly - 2-3 sets - 10 reps - 5 seconds hold - Supine Straight Leg Raises  - 2-3 x daily - 7 x weekly - 2-3 sets - 10 reps - 5 seconds hold - Seated Knee Flexion Extension AROM   - 2-4 x daily - 7 x weekly - 2-3 sets - 10 reps - 5 seconds hold - Seated straight leg lifts  - 2-3 x daily - 7 x weekly - 2-3 sets - 10 reps - 5 seconds hold - Seated Hamstring Stretch with Strap  - 2-4 x daily - 7 x weekly - 1 sets - 3 reps - 20-30 seconds hold - Seated Long Arc Quad  - 2-4 x daily - 7 x weekly - 2-3 sets - 10 reps - 5 seconds hold  ASSESSMENT: CLINICAL IMPRESSION: Patient's range continues to improve.  With her prior muscle weakness, her strength will take longer than typical.   Pt continues to benefit from skilled PT.   OBJECTIVE IMPAIRMENTS: Abnormal gait, decreased activity tolerance, decreased balance, decreased endurance, decreased knowledge of condition, decreased knowledge of use of DME, decreased mobility, difficulty walking, decreased ROM, decreased strength, increased edema, increased muscle spasms, postural dysfunction, and pain.   ACTIVITY LIMITATIONS: carrying, lifting, bending, sitting, standing, squatting, sleeping, stairs, transfers, bed mobility, and locomotion level  PARTICIPATION LIMITATIONS: meal prep, cleaning, laundry, driving, and community activity  PERSONAL FACTORS: Age, Fitness, Past/current experiences, Time since onset of injury/illness/exacerbation, and 3+ comorbidities: see PMH  are also affecting patient's functional outcome.   REHAB POTENTIAL: Good  CLINICAL DECISION MAKING: Stable/uncomplicated  EVALUATION COMPLEXITY:  Low   GOALS: Goals reviewed with patient? Yes  SHORT TERM GOALS: (target date for Short term goals 07/21/2022)   1.  Patient will demonstrate independent use of home exercise program to maintain progress from in clinic treatments.  Goal status: On Going 06/23/2022  2. PROM right knee ext -2* and flexion 100* Goal status: New  3. Interim FOTO >/= 40%  Goal status: On Going 06/23/2022  LONG TERM GOALS: (target dates for all long term goals  09/08/2022 )   1. Patient will demonstrate/report pain at worst less than or equal to 2/10 to facilitate minimal limitation in daily activity secondary to pain symptoms.  Goal status: New   2. Patient will demonstrate independent use of home exercise program to facilitate ability to maintain/progress functional gains from skilled physical therapy services.  Goal status: New   3. Patient will demonstrate FOTO outcome > or = 57 % to indicate reduced disability due to condition.  Goal status: New   4.  Patient will demonstrate right knee MMT >/= 4/5 throughout to faciltiate usual transfers, stairs, squatting at [facility] for daily life.   Goal status: New   5.  Patient AROM right knee ext 0* and flexion 100* Goal status: New   6.  patient amb >500' & neg ramp/curb with LRAD modified independent.  Goal status: New    PLAN:  PT FREQUENCY:  2x/week  PT DURATION: 12 weeks  PLANNED INTERVENTIONS: Therapeutic exercises, Therapeutic activity, Neuro Muscular re-education, Balance training, Gait training, Patient/Family education, Joint mobilization, Stair training, DME instructions, Dry Needling, Electrical stimulation, Traction, Cryotherapy, vasopneumatic deviceMoist heat, Taping, Ultrasound, Ionotophoresis 4mg /ml Dexamethasone, and aquatic therapy, Manual therapy.  All included  unless contraindicated  PLAN FOR NEXT SESSION:  continue with manual therapy & exercise for range, add standing balance & functional activities, add muscle & ITB stretches to  HEP, vaso for edema.     Vladimir Faster, PT, DPT 06/28/2022, 2:46 PM

## 2022-07-03 ENCOUNTER — Encounter: Payer: Self-pay | Admitting: Physical Therapy

## 2022-07-03 ENCOUNTER — Ambulatory Visit (INDEPENDENT_AMBULATORY_CARE_PROVIDER_SITE_OTHER): Payer: Medicare Other | Admitting: Physical Therapy

## 2022-07-03 DIAGNOSIS — M25561 Pain in right knee: Secondary | ICD-10-CM

## 2022-07-03 DIAGNOSIS — R2689 Other abnormalities of gait and mobility: Secondary | ICD-10-CM

## 2022-07-03 DIAGNOSIS — R2681 Unsteadiness on feet: Secondary | ICD-10-CM | POA: Diagnosis not present

## 2022-07-03 DIAGNOSIS — M25661 Stiffness of right knee, not elsewhere classified: Secondary | ICD-10-CM | POA: Diagnosis not present

## 2022-07-03 DIAGNOSIS — R600 Localized edema: Secondary | ICD-10-CM | POA: Diagnosis not present

## 2022-07-03 DIAGNOSIS — M6281 Muscle weakness (generalized): Secondary | ICD-10-CM

## 2022-07-03 NOTE — Therapy (Signed)
OUTPATIENT PHYSICAL THERAPY LOWER EXTREMITY TREATMENT   Patient Name: LEINAALA CATANESE MRN: [id number] DOB:03-14-1945, 77 y.o., female Today's Date: 07/03/2022  END OF SESSION:  PT End of Session - 07/03/22 1513     Visit Number 5    Number of Visits 24    Date for PT Re-Evaluation 09/08/22    Authorization Type Medicare A&B and [employer] Fed emp    Progress Note Due on Visit 10    PT Start Time 1513    PT Stop Time 1616    PT Time Calculation (min) 63 min    Activity Tolerance Patient tolerated treatment well;Patient limited by pain;No increased pain    Behavior During Therapy [facility] for tasks assessed/performed                Past Medical History:  Diagnosis Date   Anemia    Congenital musculoskeletal deformity of spine    Depression    Headache    Hypertension    Incontinence of bowel 09/2013   Lumbago    Lumbosacral spondylosis without myelopathy    Sleep apnea    Sleep disorder    uses gabapentin at bedtime    Spinal stenosis, lumbar region, without neurogenic claudication    Past Surgical History:  Procedure Laterality Date   ANKLE SURGERY Right 2015   lengthened the achilles    ANTERIOR HIP REVISION Right 10/07/2020   Procedure: RIGHT HIP REVISION OF  HIP BALL/POLY-LINER;  Surgeon: Kathryne Hitch, MD;  Location: [facility] OR;  Service: Orthopedics;  Laterality: Right;  RNFA PLEASE   AUGMENTATION MAMMAPLASTY Bilateral 2000 or 2001 unsure    Left Implant has busted and is no longer visible   BACK SURGERY  2015   fusion , Dr Noel Gerold at [facility]; has 2 metal rods in place , fusion from t2 to s1    BACK SURGERY  01/2017   Dr Sharolyn Douglas ; repair of cracked titanium rod in back lumbar    BREAST EXCISIONAL BIOPSY Left    30 yrs ago   bunionectomy  Bilateral 1980s   multiple    COLONOSCOPY     with polypectomy ; q 5 years    FOOT SURGERY  07/2011   SHOULDER SURGERY Left 2013   rotator cuff    TOTAL HIP ARTHROPLASTY Right 05/08/2017   Procedure: RIGHT  TOTAL HIP ARTHROPLASTY ANTERIOR APPROACH;  Surgeon: Durene Romans, MD;  Location: [facility];  Service: Orthopedics;  Laterality: Right;  70 mins   TOTAL KNEE ARTHROPLASTY Right 06/06/2022   Procedure: RIGHT TOTAL KNEE ARTHROPLASTY;  Surgeon: Kathryne Hitch, MD;  Location: [facility] OR;  Service: Orthopedics;  Laterality: Right;   Patient Active Problem List   Diagnosis Date Noted   Status post total right knee replacement 06/06/2022   Leg length discrepancy, Right 10/07/2020   History of revision of total replacement of right hip joint 10/07/2020   Multinodular goiter 05/18/2020   Hip contracture, unspecified laterality 07/02/2018   Overweight (BMI 25.0-29.9) 05/09/2017   S/P right THA, AA 05/08/2017   Chronic bilateral low back pain without sciatica 04/05/2015   Disorder of sacroiliac joint 12/22/2014   Chronic low back pain 12/22/2014   Acquired scoliosis 11/27/2011   Lumbosacral spondylosis without myelopathy 05/22/2011    PCP: Jarrett Soho, MD  REFERRING PROVIDER: Kathryne Hitch, MD  REFERRING DIAG:  [phone] (ICD-10-CM) - Status post total right knee replacement  M17.11 (ICD-10-CM) - Unilateral primary osteoarthritis, right knee   THERAPY DIAG:  Acute pain of right knee  Stiffness of right knee, not elsewhere classified  Muscle weakness (generalized)  Other abnormalities of gait and mobility  Unsteadiness on feet  Localized edema  Rationale for Evaluation and Treatment: Rehabilitation  ONSET DATE: 06/06/2022 TKA  SUBJECTIVE:   SUBJECTIVE STATEMENT: She stood to cook for her birthday. She tried the pillows with bed and it helped.  She woke up last night and put ice pack on knee. She was able to get right back to sleep.   PERTINENT HISTORY: Right TKA 06/06/2022, OA, leg length discrepancy, right Total hip replacement anterior approach with revision 2022, hip contracture, chronic LBP with surgery, acquired scoliosis, HTN, ankle surgery lengthen Achilles,    PAIN:  NPRS scale: today 7/10 and since last PT  2/10 - 7/10 Pain location: right knee along joint line Pain description: at rest dull ache and intermittent spasm & sharp pain Aggravating factors: up too long, first moving if knee has gotten stiffen Relieving factors: Oxy, muscle relax & ice  PRECAUTIONS: Fall  WEIGHT BEARING RESTRICTIONS: No  FALLS:  Has patient fallen in last 6 months? No  LIVING ENVIRONMENT: Lives with: lives with their spouse and 2 dogs 12-14# Lives in: [address] house with master bedroom & bathroom main level Stairs: Yes: Internal: 16-18 steps; on left going up and External: 3 steps; none Has following equipment at home: Single point cane, Environmental consultant - 2 wheeled, Marine scientist  OCCUPATION: retired from Engineer, [employer] (consulting),   PLOF: Independent  PATIENT GOALS:  walk & be active throughout her day, go upstairs to sewing room, sewing machine uses RLE  Next MD visit: 07/17/2022  OBJECTIVE:  DIAGNOSTIC FINDINGS: 06/06/2022 post op X-ray showed right knee arthroplasty without immediate postoperative complication.  PATIENT SURVEYS:  FOTO intake:  29%  predicted:  57%  COGNITION: Overall cognitive status: WFL    SENSATION: WFL  EDEMA:  Circumferential:  LLE: above knee 43cm,  around knee 39.5cm, below knee 35 cm RLE: above knee 45.7cm,  around knee 40.8cm, below knee 38.9 cm  POSTURE: rounded shoulders, forward head, flexed trunk , and weight shift left  PALPATION: Right knee tenderness along joint line, right ITB tightness & tenderness  LOWER EXTREMITY ROM:   ROM Right eval Right 06/23/22 Right 06/28/22  Hip flexion     Hip extension     Hip abduction     Hip adduction     Hip internal rotation     Hip external rotation     Knee flexion Seated A:84* P: 93* Active 98 Seated A: 100* P: 104*  Knee extension Supine P: -7* Active -4 Standing A: -4*  Ankle dorsiflexion     Ankle plantarflexion     Ankle inversion     Ankle eversion      (Blank  rows = not tested)  LOWER EXTREMITY MMT:  MMT Right eval  Hip flexion   Hip extension   Hip abduction   Hip adduction   Hip internal rotation   Hip external rotation   Knee flexion 3-/5  Knee extension 3-/5  Ankle dorsiflexion   Ankle plantarflexion   Ankle inversion   Ankle eversion    (Blank rows = not tested)   FUNCTIONAL TESTS:  22 inch chair transfer: requires use of BUEs on armrest, RLE extended with minimal assistance and requires RW for stabilization  GAIT: Distance walked: 100' Assistive device utilized: Environmental consultant - 2 wheeled Level of assistance: SBA Comments: RLE decreased stance duration, abduction, right knee flexed in  stance with minimal increase in flexion for swing.    TODAY'S TREATMENT                                                                          DATE: 07/03/2022: Therapeutic Exercise: Precor recumbent bike seat 5 level 1 full revolutions for knee range 8 min Gastroc stretch on incline board straddle position 30 sec hold 2 reps; Heel raises on incline board 10 reps RUE support reaching LUE upward   RLE TKE green theraband with RW support 10 reps 2 sets BLEs tapping contralateral LE to 6" step. Mirror for feedback to upright. Step up, over & step down RLE 6" step BUE support 10 reps with PT cues on technique. Gait with RW working on stance knee ext, upright trunk and pelvic weight shift over stance LE. PT instructed in sitting behind wheel of car running in park moving RLE gas to brake progressively increase speed up to sudden stop. PT demo & verbal cues on loading RW if alone. Pt verbalized understanding.  Added stretches below to HEP. Pt performed 2 reps of each for return demo & verbalized understanding.  - Seated Table Hamstring Stretch  -2 reps - 30 seconds hold - Supine Quadriceps Stretch with Strap on Table  2 reps - 30 seconds hold - standing calf stretch with forefoot on small step or brick  - 2 reps - 30 seconds hold - Standing ITB Stretch   - 2 reps - 30 seconds hold  Neuromuscular Re-education: Tandem stance on foam beam 30 sec RLE in front & in back with intermittent touch. Standing crossways on foam beam 30 sec static; head turns right/left, up/down & diagonals with improved stability with later reps. Sitting upright on 24" bar stool without back or arm support during rests.  Manual therapy: PROM with overpressure right knee flexion   Vaso to right knee 34* medium compression 10 min.    06/28/2022: Therapeutic Exercise: SciFit bike with BLEs / BUEs seat 10 level 1 full revolutions for knee range 8 min Gastroc stretch on incline board 30 sec hold 2 reps; Heel raises on incline board 10 reps light BUE support Tandem stance on foam beam 30 sec RLE in front & in back with intermittent touch. PT demo & verbal cues on how to do at home bw sink & chair back on floor initially. Pt verbalized understanding.  Standing with posterior pelvis to counter height support (RW in front for safety) working on upright posture with upper body over lower body / feet. Pt verbalized how to work on this & why at home.  In above position RLE TKE green theraband with RW support 10 reps 2 sets Gait with RW working on stance knee ext LLE. Seated LAQ 2# BLEs and active knee flexion with contralateral LE opposing motion 3 sec 10 reps 2 sets  Self-care: PT demo & verbal cues on positioning in bed with pillow bw LEs & 2 folded pillows "tenting" off feet. Pt verbalized understanding.   Manual Therapy PROM with overpressure for knee flexion seated PROM with overpressure for knee ext  Vaso right knee elevation for 10 minutes Medium compression 34*  06/26/2022: Therapeutic Exercise: SciFit bike with BLEs / BUEs seat 11  level 1 full revolutions for knee range 8 min Gastroc stretch on incline board 30 sec hold 2 reps Heel raises on incline board 10 reps light BUE support Tandem stance on foam beam 30 sec RLE in front & in back with intermittent  touch Step up on 4" step active stepping and RLE concentric & eccentric control 5 reps RUE support & 5 reps LUE support Standing with posterior pelvis to counter height support (RW in front for safety) working on upright posture with upper body over lower body / feet. Pt verbalized how to work on this & why at home.  In above position RLE TKE green theraband with RW support 10 reps 2 sets Sit to /from stand 24" bar stool without UE assist 10 reps Bridge 10 reps Supine SAQ 2# 10 reps 2 sets  Self-care: PT demo & verbal cues on using 24" bar stool for modified standing for ADLs in kitchen & bathroom. Pt verbalized understanding.   Manual Therapy PROM with overpressure for knee flexion seated  Vaso right knee elevation for 10 minutes Medium compression 34*   PATIENT EDUCATION:  Education details: HEP, POC Person educated: Patient Education method: Explanation, Demonstration, Verbal cues, and Handouts Education comprehension: verbalized understanding, returned demonstration, and verbal cues required  HOME EXERCISE PROGRAM: Access Code: [email or web address] URL: [email or web address] Date: 07/03/2022 Prepared by: Vladimir Faster  Exercises - Ankle Alphabet in Elevation  - 2-4 x daily - 7 x weekly - 1 sets - 1 reps - supine quad set with towel roll under ankle  - 2-3 x daily - 7 x weekly - 2-3 sets - 10 reps - 5 seconds hold - Supine Heel Slide with Strap  - 2-3 x daily - 7 x weekly - 2-3 sets - 10 reps - 5 seconds hold - Supine Knee Extension Strengthening  - 2-3 x daily - 7 x weekly - 2-3 sets - 10 reps - 5 seconds hold - Supine Straight Leg Raises  - 2-3 x daily - 7 x weekly - 2-3 sets - 10 reps - 5 seconds hold - Seated Knee Flexion Extension AROM   - 2-4 x daily - 7 x weekly - 2-3 sets - 10 reps - 5 seconds hold - Seated straight leg lifts  - 2-3 x daily - 7 x weekly - 2-3 sets - 10 reps - 5 seconds hold - Seated Hamstring Stretch with Strap  - 2-4 x daily - 7 x weekly - 1 sets -  3 reps - 20-30 seconds hold - Seated Long Arc Quad  - 2-4 x daily - 7 x weekly - 2-3 sets - 10 reps - 5 seconds hold - Supine ITB Stretch with Strap  - 2 x daily - 7 x weekly - 1 sets - 5 reps - 20 seconds hold - Seated Knee Flexion AAROM  - 3 x daily - 7 x weekly - 1 sets - 10 reps - 10 seconds hold - Seated Table Hamstring Stretch  - 2 x daily - 7 x weekly - 1 sets - 3 reps - 30 seconds hold - Supine Quadriceps Stretch with Strap on Table  - 2 x daily - 7 x weekly - 1 sets - 3 reps - 30 seconds hold - standing calf stretch with forefoot on small step or brick  - 2 x daily - 7 x weekly - 1 sets - 3 reps - 30 seconds hold - Standing ITB Stretch  - 1 x daily - 7 x weekly - 1  sets - 3 reps - 30 seconds hold  ASSESSMENT: CLINICAL IMPRESSION: PT educated pt on doing standing activities with upright posture which full length mirror may help at home which she seems to understand.  Pt improved balance reactions with instruction & repetition. She appears to understand updated HEP with stretches.   Pt continues to benefit from skilled PT.   OBJECTIVE IMPAIRMENTS: Abnormal gait, decreased activity tolerance, decreased balance, decreased endurance, decreased knowledge of condition, decreased knowledge of use of DME, decreased mobility, difficulty walking, decreased ROM, decreased strength, increased edema, increased muscle spasms, postural dysfunction, and pain.   ACTIVITY LIMITATIONS: carrying, lifting, bending, sitting, standing, squatting, sleeping, stairs, transfers, bed mobility, and locomotion level  PARTICIPATION LIMITATIONS: meal prep, cleaning, laundry, driving, and community activity  PERSONAL FACTORS: Age, Fitness, Past/current experiences, Time since onset of injury/illness/exacerbation, and 3+ comorbidities: see PMH  are also affecting patient's functional outcome.   REHAB POTENTIAL: Good  CLINICAL DECISION MAKING: Stable/uncomplicated  EVALUATION COMPLEXITY: Low   GOALS: Goals  reviewed with patient? Yes  SHORT TERM GOALS: (target date for Short term goals 07/21/2022)   1.  Patient will demonstrate independent use of home exercise program to maintain progress from in clinic treatments.  Goal status: Ongoing 07/03/2022  2. PROM right knee ext -2* and flexion 100* Goal status: Ongoing 07/03/2022  3. Interim FOTO >/= 40%  Goal status: Ongoing 07/03/2022  LONG TERM GOALS: (target dates for all long term goals  09/08/2022 )   1. Patient will demonstrate/report pain at worst less than or equal to 2/10 to facilitate minimal limitation in daily activity secondary to pain symptoms.  Goal status: Ongoing 07/03/2022   2. Patient will demonstrate independent use of home exercise program to facilitate ability to maintain/progress functional gains from skilled physical therapy services.  Goal status: Ongoing 07/03/2022   3. Patient will demonstrate FOTO outcome > or = 57 % to indicate reduced disability due to condition.  Goal status: Ongoing 07/03/2022   4.  Patient will demonstrate right knee MMT >/= 4/5 throughout to faciltiate usual transfers, stairs, squatting at [facility] for daily life.   Goal status: Ongoing 07/03/2022   5.  Patient AROM right knee ext 0* and flexion 100* Goal status: Ongoing 07/03/2022   6.  patient amb >500' & neg ramp/curb with LRAD modified independent.  Goal status: Ongoing 07/03/2022    PLAN:  PT FREQUENCY:  2x/week  PT DURATION: 12 weeks  PLANNED INTERVENTIONS: Therapeutic exercises, Therapeutic activity, Neuro Muscular re-education, Balance training, Gait training, Patient/Family education, Joint mobilization, Stair training, DME instructions, Dry Needling, Electrical stimulation, Traction, Cryotherapy, vasopneumatic deviceMoist heat, Taping, Ultrasound, Ionotophoresis 4mg /ml Dexamethasone, and aquatic therapy, Manual therapy.  All included unless contraindicated  PLAN FOR NEXT SESSION:  manual therapy & exercise for range, add standing  balance & functional activities, check on HEP added muscle & ITB stretches, vaso for edema.     Vladimir Faster, PT, DPT 07/03/2022, 4:31 PM

## 2022-07-05 ENCOUNTER — Ambulatory Visit (INDEPENDENT_AMBULATORY_CARE_PROVIDER_SITE_OTHER): Payer: Medicare Other | Admitting: Physical Therapy

## 2022-07-05 ENCOUNTER — Encounter: Payer: Self-pay | Admitting: Physical Therapy

## 2022-07-05 DIAGNOSIS — R600 Localized edema: Secondary | ICD-10-CM | POA: Diagnosis not present

## 2022-07-05 DIAGNOSIS — M25661 Stiffness of right knee, not elsewhere classified: Secondary | ICD-10-CM | POA: Diagnosis not present

## 2022-07-05 DIAGNOSIS — R2689 Other abnormalities of gait and mobility: Secondary | ICD-10-CM

## 2022-07-05 DIAGNOSIS — M6281 Muscle weakness (generalized): Secondary | ICD-10-CM

## 2022-07-05 DIAGNOSIS — R2681 Unsteadiness on feet: Secondary | ICD-10-CM

## 2022-07-05 DIAGNOSIS — M25561 Pain in right knee: Secondary | ICD-10-CM

## 2022-07-05 NOTE — Therapy (Signed)
OUTPATIENT PHYSICAL THERAPY LOWER EXTREMITY TREATMENT   Patient Name: [name] MRN: [id number] DOB:August 01, 1945, 77 y.o., female Today's Date: 07/05/2022  END OF SESSION:  PT End of Session - 07/05/22 1354     Visit Number 6    Number of Visits 24    Date for PT Re-Evaluation 09/08/22    Authorization Type Medicare A&B and [employer] Fed emp    Progress Note Due on Visit 10    PT Start Time 1350    PT Stop Time 1440    PT Time Calculation (min) 50 min    Activity Tolerance Patient tolerated treatment well;Patient limited by pain;No increased pain    Behavior During Therapy [facility] for tasks assessed/performed                 Past Medical History:  Diagnosis Date   Anemia    Congenital musculoskeletal deformity of spine    Depression    Headache    Hypertension    Incontinence of bowel 09/2013   Lumbago    Lumbosacral spondylosis without myelopathy    Sleep apnea    Sleep disorder    uses gabapentin at bedtime    Spinal stenosis, lumbar region, without neurogenic claudication    Past Surgical History:  Procedure Laterality Date   ANKLE SURGERY Right 2015   lengthened the achilles    ANTERIOR HIP REVISION Right 10/07/2020   Procedure: RIGHT HIP REVISION OF  HIP BALL/POLY-LINER;  Surgeon: Kathryne Hitch, MD;  Location: [facility] OR;  Service: Orthopedics;  Laterality: Right;  RNFA PLEASE   AUGMENTATION MAMMAPLASTY Bilateral 2000 or 2001 unsure    Left Implant has busted and is no longer visible   BACK SURGERY  2015   fusion , Dr Noel Gerold at [facility]; has 2 metal rods in place , fusion from t2 to s1    BACK SURGERY  01/2017   Dr Sharolyn Douglas ; repair of cracked titanium rod in back lumbar    BREAST EXCISIONAL BIOPSY Left    30 yrs ago   bunionectomy  Bilateral 1980s   multiple    COLONOSCOPY     with polypectomy ; q 5 years    FOOT SURGERY  07/2011   SHOULDER SURGERY Left 2013   rotator cuff    TOTAL HIP ARTHROPLASTY Right 05/08/2017   Procedure:  RIGHT TOTAL HIP ARTHROPLASTY ANTERIOR APPROACH;  Surgeon: Durene Romans, MD;  Location: [facility];  Service: Orthopedics;  Laterality: Right;  70 mins   TOTAL KNEE ARTHROPLASTY Right 06/06/2022   Procedure: RIGHT TOTAL KNEE ARTHROPLASTY;  Surgeon: Kathryne Hitch, MD;  Location: [facility] OR;  Service: Orthopedics;  Laterality: Right;   Patient Active Problem List   Diagnosis Date Noted   Status post total right knee replacement 06/06/2022   Leg length discrepancy, Right 10/07/2020   History of revision of total replacement of right hip joint 10/07/2020   Multinodular goiter 05/18/2020   Hip contracture, unspecified laterality 07/02/2018   Overweight (BMI 25.0-29.9) 05/09/2017   S/P right THA, AA 05/08/2017   Chronic bilateral low back pain without sciatica 04/05/2015   Disorder of sacroiliac joint 12/22/2014   Chronic low back pain 12/22/2014   Acquired scoliosis 11/27/2011   Lumbosacral spondylosis without myelopathy 05/22/2011    PCP: Jarrett Soho, MD  REFERRING PROVIDER: Kathryne Hitch, MD  REFERRING DIAG:  [phone] (ICD-10-CM) - Status post total right knee replacement  M17.11 (ICD-10-CM) - Unilateral primary osteoarthritis, right knee   THERAPY DIAG:  Acute pain of right knee  Stiffness of right knee, not elsewhere classified  Muscle weakness (generalized)  Other abnormalities of gait and mobility  Unsteadiness on feet  Localized edema  Rationale for Evaluation and Treatment: Rehabilitation  ONSET DATE: 06/06/2022 TKA  SUBJECTIVE:   SUBJECTIVE STATEMENT: She did a lot yesterday with hair appt and shopping. Last night the pain was high.  She was able to drive car with no issues.   PERTINENT HISTORY: Right TKA 06/06/2022, OA, leg length discrepancy, right Total hip replacement anterior approach with revision 2022, hip contracture, chronic LBP with surgery, acquired scoliosis, HTN, ankle surgery lengthen Achilles,   PAIN:  NPRS scale: today 2/10 and  since last PT  2/10 - 8/10 Pain location: right knee along joint line Pain description: at rest dull ache and intermittent spasm & sharp pain Aggravating factors: up too long, first moving if knee has gotten stiffen Relieving factors: Oxy, muscle relax & ice  PRECAUTIONS: Fall  WEIGHT BEARING RESTRICTIONS: No  FALLS:  Has patient fallen in last 6 months? No  LIVING ENVIRONMENT: Lives with: lives with their spouse and 2 dogs 12-14# Lives in: [address] house with master bedroom & bathroom main level Stairs: Yes: Internal: 16-18 steps; on left going up and External: 3 steps; none Has following equipment at home: Single point cane, Environmental consultant - 2 wheeled, Marine scientist  OCCUPATION: retired from Engineer, [employer] (consulting),   PLOF: Independent  PATIENT GOALS:  walk & be active throughout her day, go upstairs to sewing room, sewing machine uses RLE  Next MD visit: 07/17/2022  OBJECTIVE:  DIAGNOSTIC FINDINGS: 06/06/2022 post op X-ray showed right knee arthroplasty without immediate postoperative complication.  PATIENT SURVEYS:  FOTO intake:  29%  predicted:  57%  COGNITION: Overall cognitive status: WFL    SENSATION: WFL  EDEMA:  Circumferential:  LLE: above knee 43cm,  around knee 39.5cm, below knee 35 cm RLE: above knee 45.7cm,  around knee 40.8cm, below knee 38.9 cm  POSTURE: rounded shoulders, forward head, flexed trunk , and weight shift left  PALPATION: Right knee tenderness along joint line, right ITB tightness & tenderness  LOWER EXTREMITY ROM:   ROM Right eval Right 06/23/22 Right 06/28/22 Right 07/05/22  Hip flexion      Hip extension      Hip abduction      Hip adduction      Hip internal rotation      Hip external rotation      Knee flexion Seated A:84* P: 93* Active 98 Seated A: 100* P: 104* Supine P: 116*  Knee extension Supine P: -7* Active -4 Standing A: -4* Supine P: -1*  Ankle dorsiflexion      Ankle plantarflexion      Ankle inversion      Ankle  eversion       (Blank rows = not tested)  LOWER EXTREMITY MMT:  MMT Right eval  Hip flexion   Hip extension   Hip abduction   Hip adduction   Hip internal rotation   Hip external rotation   Knee flexion 3-/5  Knee extension 3-/5  Ankle dorsiflexion   Ankle plantarflexion   Ankle inversion   Ankle eversion    (Blank rows = not tested)   FUNCTIONAL TESTS:  22 inch chair transfer: requires use of BUEs on armrest, RLE extended with minimal assistance and requires RW for stabilization  GAIT: Distance walked: 100' Assistive device utilized: Environmental consultant - 2 wheeled Level of assistance: SBA Comments: RLE decreased  stance duration, abduction, right knee flexed in stance with minimal increase in flexion for swing.    TODAY'S TREATMENT                                                                          DATE: 07/05/2022: Therapeutic Exercise: Precor recumbent bike seat 5 level 1 full revolutions for knee range 8 min Supine ITB stretch strap SLR adduction 30 sec hold 2 reps BLEs Supine Quadriceps Stretch with Strap on Table  2 reps - 30 seconds hold Heel slide with ball & strap 5 sec hold flexion & ext 15 reps Hamstring stretch LLE long sit with strap DF 30 sec hold 2 reps Gastroc stretch with forefoot on towel roll weight bearing 30 sec hold 2 reps Standing TKE with green theraband 10 reps 3 sec hold 1st set just TKE, 2nd set contralateral heel raise / knee flexion for BLEs.   Therapeutic Activities: Pt amb with walking stick in RUE with cues on upright trunk and LLE knee ext in stance/flex in swing. PT educated on increasing walking with timing 10 min, then 12 min, ...  If she is pressing herself to make it back then too much.  She should not have increase in pain from walk limiting mobility or sleep over the next 24-48 hours. Pt verbalized understanding for walking program.  PT demo & verbal cues on supine<>sit via sidelying to protect back. Pt verbalized and return demo  understanding.    Manual therapy: PROM with overpressure right knee flexion   Vaso to right knee 34* medium compression 10 min.   07/03/2022: Therapeutic Exercise: Precor recumbent bike seat 5 level 1 full revolutions for knee range 8 min Gastroc stretch on incline board straddle position 30 sec hold 2 reps; Heel raises on incline board 10 reps RUE support reaching LUE upward   RLE TKE green theraband with RW support 10 reps 2 sets BLEs tapping contralateral LE to 6" step. Mirror for feedback to upright. Step up, over & step down RLE 6" step BUE support 10 reps with PT cues on technique. Gait with RW working on stance knee ext, upright trunk and pelvic weight shift over stance LE. PT instructed in sitting behind wheel of car running in park moving RLE gas to brake progressively increase speed up to sudden stop. PT demo & verbal cues on loading RW if alone. Pt verbalized understanding.  Added stretches below to HEP. Pt performed 2 reps of each for return demo & verbalized understanding.  - Seated Table Hamstring Stretch  -2 reps - 30 seconds hold - Supine Quadriceps Stretch with Strap on Table  2 reps - 30 seconds hold - standing calf stretch with forefoot on small step or brick  - 2 reps - 30 seconds hold - Standing ITB Stretch  - 2 reps - 30 seconds hold  Neuromuscular Re-education: Tandem stance on foam beam 30 sec RLE in front & in back with intermittent touch. Standing crossways on foam beam 30 sec static; head turns right/left, up/down & diagonals with improved stability with later reps. Sitting upright on 24" bar stool without back or arm support during rests.  Manual therapy: PROM with overpressure right knee flexion   Vaso to  right knee 34* medium compression 10 min.    06/28/2022: Therapeutic Exercise: SciFit bike with BLEs / BUEs seat 10 level 1 full revolutions for knee range 8 min Gastroc stretch on incline board 30 sec hold 2 reps; Heel raises on incline board 10 reps  light BUE support Tandem stance on foam beam 30 sec RLE in front & in back with intermittent touch. PT demo & verbal cues on how to do at home bw sink & chair back on floor initially. Pt verbalized understanding.  Standing with posterior pelvis to counter height support (RW in front for safety) working on upright posture with upper body over lower body / feet. Pt verbalized how to work on this & why at home.  In above position RLE TKE green theraband with RW support 10 reps 2 sets Gait with RW working on stance knee ext LLE. Seated LAQ 2# BLEs and active knee flexion with contralateral LE opposing motion 3 sec 10 reps 2 sets  Self-care: PT demo & verbal cues on positioning in bed with pillow bw LEs & 2 folded pillows "tenting" off feet. Pt verbalized understanding.   Manual Therapy PROM with overpressure for knee flexion seated PROM with overpressure for knee ext  Vaso right knee elevation for 10 minutes Medium compression 34*    HOME EXERCISE PROGRAM: Access Code: [email or web address] URL: [email or web address] Date: 07/03/2022 Prepared by: Vladimir Faster  Exercises - Ankle Alphabet in Elevation  - 2-4 x daily - 7 x weekly - 1 sets - 1 reps - supine quad set with towel roll under ankle  - 2-3 x daily - 7 x weekly - 2-3 sets - 10 reps - 5 seconds hold - Supine Heel Slide with Strap  - 2-3 x daily - 7 x weekly - 2-3 sets - 10 reps - 5 seconds hold - Supine Knee Extension Strengthening  - 2-3 x daily - 7 x weekly - 2-3 sets - 10 reps - 5 seconds hold - Supine Straight Leg Raises  - 2-3 x daily - 7 x weekly - 2-3 sets - 10 reps - 5 seconds hold - Seated Knee Flexion Extension AROM   - 2-4 x daily - 7 x weekly - 2-3 sets - 10 reps - 5 seconds hold - Seated straight leg lifts  - 2-3 x daily - 7 x weekly - 2-3 sets - 10 reps - 5 seconds hold - Seated Hamstring Stretch with Strap  - 2-4 x daily - 7 x weekly - 1 sets - 3 reps - 20-30 seconds hold - Seated Long Arc Quad  - 2-4 x daily - 7 x  weekly - 2-3 sets - 10 reps - 5 seconds hold - Supine ITB Stretch with Strap  - 2 x daily - 7 x weekly - 1 sets - 5 reps - 20 seconds hold - Seated Knee Flexion AAROM  - 3 x daily - 7 x weekly - 1 sets - 10 reps - 10 seconds hold - Seated Table Hamstring Stretch  - 2 x daily - 7 x weekly - 1 sets - 3 reps - 30 seconds hold - Supine Quadriceps Stretch with Strap on Table  - 2 x daily - 7 x weekly - 1 sets - 3 reps - 30 seconds hold - standing calf stretch with forefoot on small step or brick  - 2 x daily - 7 x weekly - 1 sets - 3 reps - 30 seconds hold - Standing ITB Stretch  - 1 x  daily - 7 x weekly - 1 sets - 3 reps - 30 seconds hold  ASSESSMENT: CLINICAL IMPRESSION: Patient improved PROM significantly. She appears to understand stretches which muscle flexibility is contributing to increased range.  Pt continues to benefit from skilled PT.   OBJECTIVE IMPAIRMENTS: Abnormal gait, decreased activity tolerance, decreased balance, decreased endurance, decreased knowledge of condition, decreased knowledge of use of DME, decreased mobility, difficulty walking, decreased ROM, decreased strength, increased edema, increased muscle spasms, postural dysfunction, and pain.   ACTIVITY LIMITATIONS: carrying, lifting, bending, sitting, standing, squatting, sleeping, stairs, transfers, bed mobility, and locomotion level  PARTICIPATION LIMITATIONS: meal prep, cleaning, laundry, driving, and community activity  PERSONAL FACTORS: Age, Fitness, Past/current experiences, Time since onset of injury/illness/exacerbation, and 3+ comorbidities: see PMH  are also affecting patient's functional outcome.   REHAB POTENTIAL: Good  CLINICAL DECISION MAKING: Stable/uncomplicated  EVALUATION COMPLEXITY: Low   GOALS: Goals reviewed with patient? Yes  SHORT TERM GOALS: (target date for Short term goals 07/21/2022)   1.  Patient will demonstrate independent use of home exercise program to maintain progress from in  clinic treatments.  Goal status: Ongoing 07/03/2022  2. PROM right knee ext -2* and flexion 100* Goal status:  MET 07/05/2022  3. Interim FOTO >/= 40%  Goal status: Ongoing 07/03/2022  LONG TERM GOALS: (target dates for all long term goals  09/08/2022 )   1. Patient will demonstrate/report pain at worst less than or equal to 2/10 to facilitate minimal limitation in daily activity secondary to pain symptoms.  Goal status: Ongoing 07/03/2022   2. Patient will demonstrate independent use of home exercise program to facilitate ability to maintain/progress functional gains from skilled physical therapy services.  Goal status: Ongoing 07/03/2022   3. Patient will demonstrate FOTO outcome > or = 57 % to indicate reduced disability due to condition.  Goal status: Ongoing 07/03/2022   4.  Patient will demonstrate right knee MMT >/= 4/5 throughout to faciltiate usual transfers, stairs, squatting at [facility] for daily life.   Goal status: Ongoing 07/03/2022   5.  Patient AROM right knee ext 0* and flexion 100* Goal status: Ongoing 07/03/2022   6.  patient amb >500' & neg ramp/curb with LRAD modified independent.  Goal status: Ongoing 07/03/2022    PLAN:  PT FREQUENCY:  2x/week  PT DURATION: 12 weeks  PLANNED INTERVENTIONS: Therapeutic exercises, Therapeutic activity, Neuro Muscular re-education, Balance training, Gait training, Patient/Family education, Joint mobilization, Stair training, DME instructions, Dry Needling, Electrical stimulation, Traction, Cryotherapy, vasopneumatic deviceMoist heat, Taping, Ultrasound, Ionotophoresis 4mg /ml Dexamethasone, and aquatic therapy, Manual therapy.  All included unless contraindicated  PLAN FOR NEXT SESSION: continue  manual therapy & exercise for range, add standing balance & functional activities, check on HEP added muscle & ITB stretches, vaso for edema.     Vladimir Faster, PT, DPT 07/05/2022, 2:50 PM

## 2022-07-10 ENCOUNTER — Encounter: Payer: Self-pay | Admitting: Physical Therapy

## 2022-07-10 ENCOUNTER — Ambulatory Visit (INDEPENDENT_AMBULATORY_CARE_PROVIDER_SITE_OTHER): Payer: Medicare Other | Admitting: Physical Therapy

## 2022-07-10 DIAGNOSIS — R2681 Unsteadiness on feet: Secondary | ICD-10-CM | POA: Diagnosis not present

## 2022-07-10 DIAGNOSIS — M25561 Pain in right knee: Secondary | ICD-10-CM

## 2022-07-10 DIAGNOSIS — R600 Localized edema: Secondary | ICD-10-CM

## 2022-07-10 DIAGNOSIS — R2689 Other abnormalities of gait and mobility: Secondary | ICD-10-CM

## 2022-07-10 DIAGNOSIS — M25661 Stiffness of right knee, not elsewhere classified: Secondary | ICD-10-CM

## 2022-07-10 DIAGNOSIS — M6281 Muscle weakness (generalized): Secondary | ICD-10-CM

## 2022-07-10 NOTE — Therapy (Signed)
OUTPATIENT PHYSICAL THERAPY LOWER EXTREMITY TREATMENT   Patient Name: [name] MRN: [id number] DOB:22-Mar-1945, 77 y.o., female Today's Date: 07/10/2022  END OF SESSION:  PT End of Session - 07/10/22 1344     Visit Number 7    Number of Visits 24    Date for PT Re-Evaluation 09/08/22    Authorization Type Medicare A&B and [employer] Fed emp    Progress Note Due on Visit 10    PT Start Time 1345    PT Stop Time 1430    PT Time Calculation (min) 45 min    Activity Tolerance Patient tolerated treatment well;Patient limited by pain;No increased pain    Behavior During Therapy [facility] for tasks assessed/performed                  Past Medical History:  Diagnosis Date   Anemia    Congenital musculoskeletal deformity of spine    Depression    Headache    Hypertension    Incontinence of bowel 09/2013   Lumbago    Lumbosacral spondylosis without myelopathy    Sleep apnea    Sleep disorder    uses gabapentin at bedtime    Spinal stenosis, lumbar region, without neurogenic claudication    Past Surgical History:  Procedure Laterality Date   ANKLE SURGERY Right 2015   lengthened the achilles    ANTERIOR HIP REVISION Right 10/07/2020   Procedure: RIGHT HIP REVISION OF  HIP BALL/POLY-LINER;  Surgeon: Kathryne Hitch, MD;  Location: [facility] OR;  Service: Orthopedics;  Laterality: Right;  RNFA PLEASE   AUGMENTATION MAMMAPLASTY Bilateral 2000 or 2001 unsure    Left Implant has busted and is no longer visible   BACK SURGERY  2015   fusion , Dr Noel Gerold at [facility]; has 2 metal rods in place , fusion from t2 to s1    BACK SURGERY  01/2017   Dr Sharolyn Douglas ; repair of cracked titanium rod in back lumbar    BREAST EXCISIONAL BIOPSY Left    30 yrs ago   bunionectomy  Bilateral 1980s   multiple    COLONOSCOPY     with polypectomy ; q 5 years    FOOT SURGERY  07/2011   SHOULDER SURGERY Left 2013   rotator cuff    TOTAL HIP ARTHROPLASTY Right 05/08/2017   Procedure:  RIGHT TOTAL HIP ARTHROPLASTY ANTERIOR APPROACH;  Surgeon: Durene Romans, MD;  Location: [facility];  Service: Orthopedics;  Laterality: Right;  70 mins   TOTAL KNEE ARTHROPLASTY Right 06/06/2022   Procedure: RIGHT TOTAL KNEE ARTHROPLASTY;  Surgeon: Kathryne Hitch, MD;  Location: [facility] OR;  Service: Orthopedics;  Laterality: Right;   Patient Active Problem List   Diagnosis Date Noted   Status post total right knee replacement 06/06/2022   Leg length discrepancy, Right 10/07/2020   History of revision of total replacement of right hip joint 10/07/2020   Multinodular goiter 05/18/2020   Hip contracture, unspecified laterality 07/02/2018   Overweight (BMI 25.0-29.9) 05/09/2017   S/P right THA, AA 05/08/2017   Chronic bilateral low back pain without sciatica 04/05/2015   Disorder of sacroiliac joint 12/22/2014   Chronic low back pain 12/22/2014   Acquired scoliosis 11/27/2011   Lumbosacral spondylosis without myelopathy 05/22/2011    PCP: Jarrett Soho, MD  REFERRING PROVIDER: Kathryne Hitch, MD  REFERRING DIAG:  [phone] (ICD-10-CM) - Status post total right knee replacement  M17.11 (ICD-10-CM) - Unilateral primary osteoarthritis, right knee   THERAPY  DIAG:  Acute pain of right knee  Stiffness of right knee, not elsewhere classified  Muscle weakness (generalized)  Other abnormalities of gait and mobility  Unsteadiness on feet  Localized edema  Rationale for Evaluation and Treatment: Rehabilitation  ONSET DATE: 06/06/2022 TKA  SUBJECTIVE:   SUBJECTIVE STATEMENT: She was sore for 2 days after last PT session.  She slept last night for first time in 3 days.   PERTINENT HISTORY: Right TKA 06/06/2022, OA, leg length discrepancy, right Total hip replacement anterior approach with revision 2022, hip contracture, chronic LBP with surgery, acquired scoliosis, HTN, ankle surgery lengthen Achilles,   PAIN:  NPRS scale: today 7/10 and since last PT  2/10 - 7/10 Pain  location: right knee along joint line Pain description: at rest dull ache and intermittent spasm & sharp pain Aggravating factors: up too long, first moving if knee has gotten stiffen Relieving factors: Oxy, muscle relax & ice  PRECAUTIONS: Fall  WEIGHT BEARING RESTRICTIONS: No  FALLS:  Has patient fallen in last 6 months? No  LIVING ENVIRONMENT: Lives with: lives with their spouse and 2 dogs 12-14# Lives in: [address] house with master bedroom & bathroom main level Stairs: Yes: Internal: 16-18 steps; on left going up and External: 3 steps; none Has following equipment at home: Single point cane, Environmental consultant - 2 wheeled, Marine scientist  OCCUPATION: retired from Engineer, [employer] (consulting),   PLOF: Independent  PATIENT GOALS:  walk & be active throughout her day, go upstairs to sewing room, sewing machine uses RLE  Next MD visit: 07/17/2022  OBJECTIVE:  DIAGNOSTIC FINDINGS: 06/06/2022 post op X-ray showed right knee arthroplasty without immediate postoperative complication.  PATIENT SURVEYS:  FOTO intake:  29%  predicted:  57%  COGNITION: Overall cognitive status: WFL    SENSATION: WFL  EDEMA:  Circumferential:  LLE: above knee 43cm,  around knee 39.5cm, below knee 35 cm RLE: above knee 45.7cm,  around knee 40.8cm, below knee 38.9 cm  POSTURE: rounded shoulders, forward head, flexed trunk , and weight shift left  PALPATION: Right knee tenderness along joint line, right ITB tightness & tenderness  LOWER EXTREMITY ROM:   ROM Right eval Right 06/23/22 Right 06/28/22 Right 07/05/22  Hip flexion      Hip extension      Hip abduction      Hip adduction      Hip internal rotation      Hip external rotation      Knee flexion Seated A:84* P: 93* Active 98 Seated A: 100* P: 104* Supine P: 116*  Knee extension Supine P: -7* Active -4 Standing A: -4* Supine P: -1*  Ankle dorsiflexion      Ankle plantarflexion      Ankle inversion      Ankle eversion       (Blank rows = not  tested)  LOWER EXTREMITY MMT:  MMT Right eval  Hip flexion   Hip extension   Hip abduction   Hip adduction   Hip internal rotation   Hip external rotation   Knee flexion 3-/5  Knee extension 3-/5  Ankle dorsiflexion   Ankle plantarflexion   Ankle inversion   Ankle eversion    (Blank rows = not tested)   FUNCTIONAL TESTS:  22 inch chair transfer: requires use of BUEs on armrest, RLE extended with minimal assistance and requires RW for stabilization  GAIT: Distance walked: 100' Assistive device utilized: Environmental consultant - 2 wheeled Level of assistance: SBA Comments: RLE decreased stance duration, abduction,  right knee flexed in stance with minimal increase in flexion for swing.    TODAY'S TREATMENT                                                                          DATE: 07/10/2022: Therapeutic Exercise: Precor recumbent bike seat 6 level 1 full revolutions for knee range 8 min Leg press 100# 10 reps 2 sets; single LE 43# 10 reps 2 sets ea LE. Tandem stance on foam RLE in front & in back 30 sec ea intermittent touch bar. Gastroc stretch incline board 30 sec hold 2 reps Standing heel raises on incline board BUE support 15 reps Standing TKE with green theraband 10 reps 3 sec hold 1st set just TKE, 2nd set contralateral heel raise / knee flexion for BLEs. LLE  Hamstring stretch long sit with strap 30 sec hold 2 reps. LLE  Quad stretch in hooklying with strap knee flexion 30 sec hold 2 reps. LAQ with LLE over edge of bed so hip ext. PT making hamstring tightness not limit full range. 10 reps 2 sets.   Self-Care: PT used knee model to explain TKA surgery. Pt verbalized better understanding. PT demo & verbal cues in ice massage. Pt verbalized & return demo understanding.    Therapeutic Activities: Pt amb with walking stick in RUE with cues on upright trunk and LLE knee ext in stance/flex in swing. PT educated in progressing decrease of UE support by distance & frequency for  example no UE within room or home, single UE like walking stick medium distances and longer distances with BUE support like walker. Pt verbalized understanding.   Manual therapy: PROM with overpressure right knee flexion   07/05/2022: Therapeutic Exercise: Precor recumbent bike seat 5 level 1 full revolutions for knee range 8 min Supine ITB stretch strap SLR adduction 30 sec hold 2 reps BLEs Supine Quadriceps Stretch with Strap on Table  2 reps - 30 seconds hold Heel slide with ball & strap 5 sec hold flexion & ext 15 reps Hamstring stretch LLE long sit with strap DF 30 sec hold 2 reps Gastroc stretch with forefoot on towel roll weight bearing 30 sec hold 2 reps Standing TKE with green theraband 10 reps 3 sec hold 1st set just TKE, 2nd set contralateral heel raise / knee flexion for BLEs.   Therapeutic Activities: Pt amb with walking stick in RUE with cues on upright trunk and LLE knee ext in stance/flex in swing. PT educated on increasing walking with timing 10 min, then 12 min, ...  If she is pressing herself to make it back then too much.  She should not have increase in pain from walk limiting mobility or sleep over the next 24-48 hours. Pt verbalized understanding for walking program.  PT demo & verbal cues on supine<>sit via sidelying to protect back. Pt verbalized and return demo understanding.    Manual therapy: PROM with overpressure right knee flexion   Vaso to right knee 34* medium compression 10 min.   07/03/2022: Therapeutic Exercise: Precor recumbent bike seat 5 level 1 full revolutions for knee range 8 min Gastroc stretch on incline board straddle position 30 sec hold 2 reps; Heel raises on incline board 10 reps  RUE support reaching LUE upward   RLE TKE green theraband with RW support 10 reps 2 sets BLEs tapping contralateral LE to 6" step. Mirror for feedback to upright. Step up, over & step down RLE 6" step BUE support 10 reps with PT cues on technique. Gait with RW  working on stance knee ext, upright trunk and pelvic weight shift over stance LE. PT instructed in sitting behind wheel of car running in park moving RLE gas to brake progressively increase speed up to sudden stop. PT demo & verbal cues on loading RW if alone. Pt verbalized understanding.  Added stretches below to HEP. Pt performed 2 reps of each for return demo & verbalized understanding.  - Seated Table Hamstring Stretch  -2 reps - 30 seconds hold - Supine Quadriceps Stretch with Strap on Table  2 reps - 30 seconds hold - standing calf stretch with forefoot on small step or brick  - 2 reps - 30 seconds hold - Standing ITB Stretch  - 2 reps - 30 seconds hold  Neuromuscular Re-education: Tandem stance on foam beam 30 sec RLE in front & in back with intermittent touch. Standing crossways on foam beam 30 sec static; head turns right/left, up/down & diagonals with improved stability with later reps. Sitting upright on 24" bar stool without back or arm support during rests.  Manual therapy: PROM with overpressure right knee flexion   Vaso to right knee 34* medium compression 10 min.     HOME EXERCISE PROGRAM: Access Code: [email or web address] URL: [email or web address] Date: 07/03/2022 Prepared by: Vladimir Faster  Exercises - Ankle Alphabet in Elevation  - 2-4 x daily - 7 x weekly - 1 sets - 1 reps - supine quad set with towel roll under ankle  - 2-3 x daily - 7 x weekly - 2-3 sets - 10 reps - 5 seconds hold - Supine Heel Slide with Strap  - 2-3 x daily - 7 x weekly - 2-3 sets - 10 reps - 5 seconds hold - Supine Knee Extension Strengthening  - 2-3 x daily - 7 x weekly - 2-3 sets - 10 reps - 5 seconds hold - Supine Straight Leg Raises  - 2-3 x daily - 7 x weekly - 2-3 sets - 10 reps - 5 seconds hold - Seated Knee Flexion Extension AROM   - 2-4 x daily - 7 x weekly - 2-3 sets - 10 reps - 5 seconds hold - Seated straight leg lifts  - 2-3 x daily - 7 x weekly - 2-3 sets - 10 reps - 5  seconds hold - Seated Hamstring Stretch with Strap  - 2-4 x daily - 7 x weekly - 1 sets - 3 reps - 20-30 seconds hold - Seated Long Arc Quad  - 2-4 x daily - 7 x weekly - 2-3 sets - 10 reps - 5 seconds hold - Supine ITB Stretch with Strap  - 2 x daily - 7 x weekly - 1 sets - 5 reps - 20 seconds hold - Seated Knee Flexion AAROM  - 3 x daily - 7 x weekly - 1 sets - 10 reps - 10 seconds hold - Seated Table Hamstring Stretch  - 2 x daily - 7 x weekly - 1 sets - 3 reps - 30 seconds hold - Supine Quadriceps Stretch with Strap on Table  - 2 x daily - 7 x weekly - 1 sets - 3 reps - 30 seconds hold - standing calf stretch with forefoot on small  step or brick  - 2 x daily - 7 x weekly - 1 sets - 3 reps - 30 seconds hold - Standing ITB Stretch  - 1 x daily - 7 x weekly - 1 sets - 3 reps - 30 seconds hold  ASSESSMENT: CLINICAL IMPRESSION: Patient seems to understand ice massage and feels will help her knee & ITB pain.  Patient is slowly progressing function but scoliosis, back & hip weakness complicate her rehab.  Pt continues to benefit from skilled PT.   OBJECTIVE IMPAIRMENTS: Abnormal gait, decreased activity tolerance, decreased balance, decreased endurance, decreased knowledge of condition, decreased knowledge of use of DME, decreased mobility, difficulty walking, decreased ROM, decreased strength, increased edema, increased muscle spasms, postural dysfunction, and pain.   ACTIVITY LIMITATIONS: carrying, lifting, bending, sitting, standing, squatting, sleeping, stairs, transfers, bed mobility, and locomotion level  PARTICIPATION LIMITATIONS: meal prep, cleaning, laundry, driving, and community activity  PERSONAL FACTORS: Age, Fitness, Past/current experiences, Time since onset of injury/illness/exacerbation, and 3+ comorbidities: see PMH  are also affecting patient's functional outcome.   REHAB POTENTIAL: Good  CLINICAL DECISION MAKING: Stable/uncomplicated  EVALUATION COMPLEXITY:  Low   GOALS: Goals reviewed with patient? Yes  SHORT TERM GOALS: (target date for Short term goals 07/21/2022)   1.  Patient will demonstrate independent use of home exercise program to maintain progress from in clinic treatments.  Goal status: Ongoing 07/03/2022  2. PROM right knee ext -2* and flexion 100* Goal status:  MET 07/05/2022  3. Interim FOTO >/= 40%  Goal status: Ongoing 07/03/2022  LONG TERM GOALS: (target dates for all long term goals  09/08/2022 )   1. Patient will demonstrate/report pain at worst less than or equal to 2/10 to facilitate minimal limitation in daily activity secondary to pain symptoms.  Goal status: Ongoing 07/03/2022   2. Patient will demonstrate independent use of home exercise program to facilitate ability to maintain/progress functional gains from skilled physical therapy services.  Goal status: Ongoing 07/03/2022   3. Patient will demonstrate FOTO outcome > or = 57 % to indicate reduced disability due to condition.  Goal status: Ongoing 07/03/2022   4.  Patient will demonstrate right knee MMT >/= 4/5 throughout to faciltiate usual transfers, stairs, squatting at [facility] for daily life.   Goal status: Ongoing 07/03/2022   5.  Patient AROM right knee ext 0* and flexion 100* Goal status: Ongoing 07/03/2022   6.  patient amb >500' & neg ramp/curb with LRAD modified independent.  Goal status: Ongoing 07/03/2022    PLAN:  PT FREQUENCY:  2x/week  PT DURATION: 12 weeks  PLANNED INTERVENTIONS: Therapeutic exercises, Therapeutic activity, Neuro Muscular re-education, Balance training, Gait training, Patient/Family education, Joint mobilization, Stair training, DME instructions, Dry Needling, Electrical stimulation, Traction, Cryotherapy, vasopneumatic deviceMoist heat, Taping, Ultrasound, Ionotophoresis 4mg /ml Dexamethasone, and aquatic therapy, Manual therapy.  All included unless contraindicated  PLAN FOR NEXT SESSION: send MD note, progress note &  FOTO, check ROM,  trial rollator walker, continue  manual therapy & exercise for range, continue to add standing balance & functional activities, vaso for edema.     Vladimir Faster, PT, DPT 07/10/2022, 4:27 PM

## 2022-07-12 ENCOUNTER — Encounter: Payer: Self-pay | Admitting: Physical Therapy

## 2022-07-12 ENCOUNTER — Ambulatory Visit (INDEPENDENT_AMBULATORY_CARE_PROVIDER_SITE_OTHER): Payer: Medicare Other | Admitting: Physical Therapy

## 2022-07-12 DIAGNOSIS — R2689 Other abnormalities of gait and mobility: Secondary | ICD-10-CM | POA: Diagnosis not present

## 2022-07-12 DIAGNOSIS — M25561 Pain in right knee: Secondary | ICD-10-CM | POA: Diagnosis not present

## 2022-07-12 DIAGNOSIS — R600 Localized edema: Secondary | ICD-10-CM | POA: Diagnosis not present

## 2022-07-12 DIAGNOSIS — M25661 Stiffness of right knee, not elsewhere classified: Secondary | ICD-10-CM | POA: Diagnosis not present

## 2022-07-12 DIAGNOSIS — M6281 Muscle weakness (generalized): Secondary | ICD-10-CM | POA: Diagnosis not present

## 2022-07-12 DIAGNOSIS — R2681 Unsteadiness on feet: Secondary | ICD-10-CM | POA: Diagnosis not present

## 2022-07-12 NOTE — Therapy (Signed)
OUTPATIENT PHYSICAL THERAPY LOWER EXTREMITY TREATMENT & PROGRESS NOTE   Patient Name: [name] MRN: [id number] DOB:05-Sep-1945, 77 y.o., female Today's Date: 07/12/2022  Progress Note Reporting Period 06/21/2022 to 07/12/2022  See note below for Objective Data and Assessment of Progress/Goals.     END OF SESSION:  PT End of Session - 07/12/22 1351     Visit Number 8    Number of Visits 24    Date for PT Re-Evaluation 09/08/22    Authorization Type Medicare A&B and [employer] emp    Progress Note Due on Visit 18    PT Start Time 1346    PT Stop Time 1440    PT Time Calculation (min) 54 min    Activity Tolerance Patient tolerated treatment well;Patient limited by pain;No increased pain    Behavior During Therapy [facility] for tasks assessed/performed                  Past Medical History:  Diagnosis Date   Anemia    Congenital musculoskeletal deformity of spine    Depression    Headache    Hypertension    Incontinence of bowel 09/2013   Lumbago    Lumbosacral spondylosis without myelopathy    Sleep apnea    Sleep disorder    uses gabapentin at bedtime    Spinal stenosis, lumbar region, without neurogenic claudication    Past Surgical History:  Procedure Laterality Date   ANKLE SURGERY Right 2015   lengthened the achilles    ANTERIOR HIP REVISION Right 10/07/2020   Procedure: RIGHT HIP REVISION OF  HIP BALL/POLY-LINER;  Surgeon: Kathryne Hitch, MD;  Location: [facility] OR;  Service: Orthopedics;  Laterality: Right;  RNFA PLEASE   AUGMENTATION MAMMAPLASTY Bilateral 2000 or 2001 unsure    Left Implant has busted and is no longer visible   BACK SURGERY  2015   fusion , Dr Noel Gerold at [facility]; has 2 metal rods in place , fusion from t2 to s1    BACK SURGERY  01/2017   Dr Sharolyn Douglas ; repair of cracked titanium rod in back lumbar    BREAST EXCISIONAL BIOPSY Left    30 yrs ago   bunionectomy  Bilateral 1980s   multiple    COLONOSCOPY     with  polypectomy ; q 5 years    FOOT SURGERY  07/2011   SHOULDER SURGERY Left 2013   rotator cuff    TOTAL HIP ARTHROPLASTY Right 05/08/2017   Procedure: RIGHT TOTAL HIP ARTHROPLASTY ANTERIOR APPROACH;  Surgeon: Durene Romans, MD;  Location: [facility];  Service: Orthopedics;  Laterality: Right;  70 mins   TOTAL KNEE ARTHROPLASTY Right 06/06/2022   Procedure: RIGHT TOTAL KNEE ARTHROPLASTY;  Surgeon: Kathryne Hitch, MD;  Location: [facility] OR;  Service: Orthopedics;  Laterality: Right;   Patient Active Problem List   Diagnosis Date Noted   Status post total right knee replacement 06/06/2022   Leg length discrepancy, Right 10/07/2020   History of revision of total replacement of right hip joint 10/07/2020   Multinodular goiter 05/18/2020   Hip contracture, unspecified laterality 07/02/2018   Overweight (BMI 25.0-29.9) 05/09/2017   S/P right THA, AA 05/08/2017   Chronic bilateral low back pain without sciatica 04/05/2015   Disorder of sacroiliac joint 12/22/2014   Chronic low back pain 12/22/2014   Acquired scoliosis 11/27/2011   Lumbosacral spondylosis without myelopathy 05/22/2011    PCP: Jarrett Soho, MD  REFERRING PROVIDER: Kathryne Hitch, MD  REFERRING DIAG:  Z61.096 (ICD-10-CM) - Status post total right knee replacement  M17.11 (ICD-10-CM) - Unilateral primary osteoarthritis, right knee   THERAPY DIAG:  Acute pain of right knee  Stiffness of right knee, not elsewhere classified  Muscle weakness (generalized)  Other abnormalities of gait and mobility  Unsteadiness on feet  Localized edema  Rationale for Evaluation and Treatment: Rehabilitation  ONSET DATE: 06/06/2022 TKA  SUBJECTIVE:   SUBJECTIVE STATEMENT: Her muscles on each side of spine are sore from using them after long term little use.  She has not tried the ice massage yet.    PERTINENT HISTORY: Right TKA 06/06/2022, OA, leg length discrepancy, right Total hip replacement anterior approach with  revision 2022, hip contracture, chronic LBP with surgery, acquired scoliosis, HTN, ankle surgery lengthen Achilles,   PAIN:  NPRS scale: today  2-3/10 and since last PT  1/10 - 8/10 Pain location: right knee along joint line Pain description: at rest dull ache and intermittent spasm & sharp pain Aggravating factors: up too long, first moving if knee has gotten stiffen Relieving factors: Oxy, muscle relax & ice  PRECAUTIONS: Fall  WEIGHT BEARING RESTRICTIONS: No  FALLS:  Has patient fallen in last 6 months? No  LIVING ENVIRONMENT: Lives with: lives with their spouse and 2 dogs 12-14# Lives in: [address] house with master bedroom & bathroom main level Stairs: Yes: Internal: 16-18 steps; on left going up and External: 3 steps; none Has following equipment at home: Single point cane, Environmental consultant - 2 wheeled, Marine scientist  OCCUPATION: retired from Engineer, [employer] (consulting),   PLOF: Independent  PATIENT GOALS:  walk & be active throughout her day, go upstairs to sewing room, sewing machine uses RLE  Next MD visit: 07/17/2022  OBJECTIVE:  DIAGNOSTIC FINDINGS: 06/06/2022 post op X-ray showed right knee arthroplasty without immediate postoperative complication.  PATIENT SURVEYS:  07/12/2022: visit 8 FOTO 50%  06/21/2022:   FOTO intake:  29%  predicted:  57%  COGNITION: Overall cognitive status: WFL    SENSATION: WFL  EDEMA:  06/21/2022:   Circumferential:  LLE: above knee 43cm,  around knee 39.5cm, below knee 35 cm RLE: above knee 45.7cm,  around knee 40.8cm, below knee 38.9 cm  POSTURE: 06/21/2022:   rounded shoulders, forward head, flexed trunk , and weight shift left  PALPATION: 06/21/2022:   Right knee tenderness along joint line, right ITB tightness & tenderness  LOWER EXTREMITY ROM:   ROM Right eval Right 06/23/22 Right 06/28/22 Right 07/05/22 Right 07/12/22  Hip flexion       Hip extension       Hip abduction       Hip adduction       Hip internal rotation       Hip external  rotation       Knee flexion Seated A:84* P: 93* Active 98 Seated A: 100* P: 104* Supine P: 116* Seated A: 111*  Knee extension Supine P: -7* Active -4 Standing A: -4* Supine P: -1* Seated A: LAQ -9* Standing A:-2*  Ankle dorsiflexion       Ankle plantarflexion       Ankle inversion       Ankle eversion        (Blank rows = not tested)  LOWER EXTREMITY MMT:  MMT Right eval  Hip flexion   Hip extension   Hip abduction   Hip adduction   Hip internal rotation   Hip external rotation   Knee flexion 3-/5  Knee extension 3-/5  Ankle dorsiflexion   Ankle plantarflexion   Ankle inversion   Ankle eversion    (Blank rows = not tested)   FUNCTIONAL TESTS:  06/21/2022:   22 inch chair transfer: requires use of BUEs on armrest, RLE extended with minimal assistance and requires RW for stabilization  GAIT: 06/21/2022:   Distance walked: 100' Assistive device utilized: Environmental consultant - 2 wheeled Level of assistance: SBA Comments: RLE decreased stance duration, abduction, right knee flexed in stance with minimal increase in flexion for swing.    TODAY'S TREATMENT                                                                          DATE: 07/12/2022: Therapeutic Exercise: Leg press 100# 10 reps 2 sets; single LE 43# 10 reps 2 sets ea LE. Tandem stance on floor RLE in front & in back 30 sec with sink / chair back for safety. Stepping towards 3 target cones (ant-lat, lat & post-lat) with stance LE close to sink knee flexion on outward motion & extension on inward motion 3 reps ea LE with light stance side UE support. PT demo & verbal cues on technique.  Standing TKE with green theraband 10 reps 3 sec hold 1st set just TKE, 2nd set contralateral heel raise / knee flexion for BLEs. Squat wide stance sink BUE support over chair 10 reps 2 sets PT demo & verbal cues on technique. Pt added to HEP with HO. Pt verbalized understanding. Seated ext stretch with LLE in 2nd chair with 5# hang 1 min,  then left foot at chair edge for flexion 1 min. 2 sets. PT demo & verbal cues on technique. Pt added to HEP with HO. Pt verbalized understanding.   Therapeutic Activities: Pt amb with walking stick in RUE with cues on upright trunk and LLE knee ext in stance/flex in swing. PT educated in progressing decrease of UE support by distance & frequency for example no UE within room or home, single UE like walking stick medium distances and longer distances with BUE support like walker. Pt verbalized understanding.   Manual therapy: PROM with overpressure right knee flexion   Vaso to right knee 34* medium compression 10 min with LE elevation.   07/10/2022: Therapeutic Exercise: Precor recumbent bike seat 6 level 1 full revolutions for knee range 8 min Leg press 100# 10 reps 2 sets; single LE 43# 10 reps 2 sets ea LE. Tandem stance on foam RLE in front & in back 30 sec ea intermittent touch bar. Gastroc stretch incline board 30 sec hold 2 reps Standing heel raises on incline board BUE support 15 reps Standing TKE with green theraband 10 reps 3 sec hold 1st set just TKE, 2nd set contralateral heel raise / knee flexion for BLEs. LLE  Hamstring stretch long sit with strap 30 sec hold 2 reps. LLE  Quad stretch in hooklying with strap knee flexion 30 sec hold 2 reps. LAQ with LLE over edge of bed so hip ext. PT making hamstring tightness not limit full range. 10 reps 2 sets.   Self-Care: PT used knee model to explain TKA surgery. Pt verbalized better understanding. PT demo & verbal cues in ice massage. Pt verbalized & return demo understanding.  Therapeutic Activities: Pt amb with walking stick in RUE with cues on upright trunk and LLE knee ext in stance/flex in swing. PT educated in progressing decrease of UE support by distance & frequency for example no UE within room or home, single UE like walking stick medium distances and longer distances with BUE support like walker. Pt verbalized  understanding.   Manual therapy: PROM with overpressure right knee flexion   07/05/2022: Therapeutic Exercise: Precor recumbent bike seat 5 level 1 full revolutions for knee range 8 min Supine ITB stretch strap SLR adduction 30 sec hold 2 reps BLEs Supine Quadriceps Stretch with Strap on Table  2 reps - 30 seconds hold Heel slide with ball & strap 5 sec hold flexion & ext 15 reps Hamstring stretch LLE long sit with strap DF 30 sec hold 2 reps Gastroc stretch with forefoot on towel roll weight bearing 30 sec hold 2 reps Standing TKE with green theraband 10 reps 3 sec hold 1st set just TKE, 2nd set contralateral heel raise / knee flexion for BLEs.   Therapeutic Activities: Pt amb with walking stick in RUE with cues on upright trunk and LLE knee ext in stance/flex in swing. PT educated on increasing walking with timing 10 min, then 12 min, ...  If she is pressing herself to make it back then too much.  She should not have increase in pain from walk limiting mobility or sleep over the next 24-48 hours. Pt verbalized understanding for walking program.  PT demo & verbal cues on supine<>sit via sidelying to protect back. Pt verbalized and return demo understanding.    Manual therapy: PROM with overpressure right knee flexion   Vaso to right knee 34* medium compression 10 min.     HOME EXERCISE PROGRAM: Access Code: [email or web address] URL: [email or web address] Date: 07/12/2022 Prepared by: Vladimir Faster  Exercises - Ankle Alphabet in Elevation  - 2-4 x daily - 7 x weekly - 1 sets - 1 reps - supine quad set with towel roll under ankle  - 2-3 x daily - 7 x weekly - 2-3 sets - 10 reps - 5 seconds hold - Supine Heel Slide with Strap  - 2-3 x daily - 7 x weekly - 2-3 sets - 10 reps - 5 seconds hold - Supine Knee Extension Strengthening  - 2-3 x daily - 7 x weekly - 2-3 sets - 10 reps - 5 seconds hold - Supine Straight Leg Raises  - 2-3 x daily - 7 x weekly - 2-3 sets - 10 reps - 5  seconds hold - Seated Knee Flexion Extension AROM   - 2-4 x daily - 7 x weekly - 2-3 sets - 10 reps - 5 seconds hold - Seated straight leg lifts  - 2-3 x daily - 7 x weekly - 2-3 sets - 10 reps - 5 seconds hold - Seated Hamstring Stretch with Strap  - 2-4 x daily - 7 x weekly - 1 sets - 3 reps - 20-30 seconds hold - Seated Long Arc Quad  - 2-4 x daily - 7 x weekly - 2-3 sets - 10 reps - 5 seconds hold - Seated Knee Flexion AAROM  - 3 x daily - 7 x weekly - 1 sets - 10 reps - 10 seconds hold - Seated Table Hamstring Stretch  - 2 x daily - 7 x weekly - 1 sets - 3 reps - 30 seconds hold - Supine Quadriceps Stretch with Strap on Table  - 2 x daily -  7 x weekly - 1 sets - 3 reps - 30 seconds hold - standing calf stretch with forefoot on small step or brick  - 2 x daily - 7 x weekly - 1 sets - 3 reps - 30 seconds hold - Supine ITB Stretch with Strap  - 1 x daily - 7 x weekly - 1 sets - 3 reps - 30 seconds hold - squat over chair like "dirty toilet"  - 1 x daily - 7 x weekly - 2-3 sets - 10 reps - 5 seconds hold - Seated Knee Extension Stretch with Chair  - 1-3 x daily - 7 x weekly - 1 sets - 2-3 reps - 10-20 seconds hold  ASSESSMENT: CLINICAL IMPRESSION: Pt. has attended 8 visits overall during course of treatment.  See objective data for updated information.  Pt. has made good gains to this point c mild defiicts noted in Rt knee AROM and movement coordination.  Pt. may continue to benefit from skilled PT services to continue progression towards reaching established goals and reduced difficulty due to presentation.  OBJECTIVE IMPAIRMENTS: Abnormal gait, decreased activity tolerance, decreased balance, decreased endurance, decreased knowledge of condition, decreased knowledge of use of DME, decreased mobility, difficulty walking, decreased ROM, decreased strength, increased edema, increased muscle spasms, postural dysfunction, and pain.   ACTIVITY LIMITATIONS: carrying, lifting, bending, sitting,  standing, squatting, sleeping, stairs, transfers, bed mobility, and locomotion level  PARTICIPATION LIMITATIONS: meal prep, cleaning, laundry, driving, and community activity  PERSONAL FACTORS: Age, Fitness, Past/current experiences, Time since onset of injury/illness/exacerbation, and 3+ comorbidities: see PMH  are also affecting patient's functional outcome.   REHAB POTENTIAL: Good  CLINICAL DECISION MAKING: Stable/uncomplicated  EVALUATION COMPLEXITY: Low   GOALS: Goals reviewed with patient? Yes  SHORT TERM GOALS: (target date for Short term goals 07/21/2022)   1.  Patient will demonstrate independent use of home exercise program to maintain progress from in clinic treatments.  Goal status: Ongoing 07/03/2022  2. PROM right knee ext -2* and flexion 100* Goal status:  MET 07/05/2022  3. Interim FOTO >/= 40%  Goal status: Ongoing 07/03/2022  LONG TERM GOALS: (target dates for all long term goals  09/08/2022 )   1. Patient will demonstrate/report pain at worst less than or equal to 2/10 to facilitate minimal limitation in daily activity secondary to pain symptoms.  Goal status: Ongoing 07/03/2022   2. Patient will demonstrate independent use of home exercise program to facilitate ability to maintain/progress functional gains from skilled physical therapy services.  Goal status: Ongoing 07/03/2022   3. Patient will demonstrate FOTO outcome > or = 57 % to indicate reduced disability due to condition.  Goal status: Ongoing 07/03/2022   4.  Patient will demonstrate right knee MMT >/= 4/5 throughout to faciltiate usual transfers, stairs, squatting at [facility] for daily life.   Goal status: Ongoing 07/03/2022   5.  Patient AROM right knee ext 0* and flexion 100* Goal status: Ongoing 07/03/2022   6.  patient amb >500' & neg ramp/curb with LRAD modified independent.  Goal status: Ongoing 07/03/2022    PLAN:  PT FREQUENCY:  2x/week  PT DURATION: 12 weeks  PLANNED INTERVENTIONS:  Therapeutic exercises, Therapeutic activity, Neuro Muscular re-education, Balance training, Gait training, Patient/Family education, Joint mobilization, Stair training, DME instructions, Dry Needling, Electrical stimulation, Traction, Cryotherapy, vasopneumatic deviceMoist heat, Taping, Ultrasound, Ionotophoresis 4mg /ml Dexamethasone, and aquatic therapy, Manual therapy.  All included unless contraindicated  PLAN FOR NEXT SESSION: check on 2 new exercises added to HEP  and consider adding stepping to 3 cones,  trial rollator walker, continue  manual therapy & exercise for range, continue to add standing balance & functional activities, vaso for edema.     Vladimir Faster, PT, DPT 07/12/2022, 3:51 PM

## 2022-07-17 ENCOUNTER — Encounter: Payer: Self-pay | Admitting: Physical Therapy

## 2022-07-17 ENCOUNTER — Encounter: Payer: Self-pay | Admitting: Physician Assistant

## 2022-07-17 ENCOUNTER — Ambulatory Visit (INDEPENDENT_AMBULATORY_CARE_PROVIDER_SITE_OTHER): Payer: Medicare Other | Admitting: Physical Therapy

## 2022-07-17 ENCOUNTER — Ambulatory Visit (INDEPENDENT_AMBULATORY_CARE_PROVIDER_SITE_OTHER): Payer: Medicare Other | Admitting: Physician Assistant

## 2022-07-17 DIAGNOSIS — R2689 Other abnormalities of gait and mobility: Secondary | ICD-10-CM | POA: Diagnosis not present

## 2022-07-17 DIAGNOSIS — M6281 Muscle weakness (generalized): Secondary | ICD-10-CM

## 2022-07-17 DIAGNOSIS — R600 Localized edema: Secondary | ICD-10-CM | POA: Diagnosis not present

## 2022-07-17 DIAGNOSIS — M25661 Stiffness of right knee, not elsewhere classified: Secondary | ICD-10-CM

## 2022-07-17 DIAGNOSIS — Z96651 Presence of right artificial knee joint: Secondary | ICD-10-CM

## 2022-07-17 DIAGNOSIS — M25561 Pain in right knee: Secondary | ICD-10-CM | POA: Diagnosis not present

## 2022-07-17 NOTE — Therapy (Signed)
OUTPATIENT PHYSICAL THERAPY LOWER EXTREMITY TREATMENT   Patient Name: [name] MRN: [id number] DOB:December 22, 1945, 77 y.o., female Today's Date: 07/17/2022   END OF SESSION:  PT End of Session - 07/17/22 1348     Visit Number 9    Number of Visits 24    Date for PT Re-Evaluation 09/08/22    Authorization Type Medicare A&B and [employer]BS Fed emp    Progress Note Due on Visit 18    PT Start Time 1346    PT Stop Time 1440    PT Time Calculation (min) 54 min    Activity Tolerance Patient tolerated treatment well;Patient limited by pain;No increased pain    Behavior During Therapy [facility] for tasks assessed/performed                  Past Medical History:  Diagnosis Date   Anemia    Congenital musculoskeletal deformity of spine    Depression    Headache    Hypertension    Incontinence of bowel 09/2013   Lumbago    Lumbosacral spondylosis without myelopathy    Sleep apnea    Sleep disorder    uses gabapentin at bedtime    Spinal stenosis, lumbar region, without neurogenic claudication    Past Surgical History:  Procedure Laterality Date   ANKLE SURGERY Right 2015   lengthened the achilles    ANTERIOR HIP REVISION Right 10/07/2020   Procedure: RIGHT HIP REVISION OF  HIP BALL/POLY-LINER;  Surgeon: Kathryne Hitch, MD;  Location: [facility] OR;  Service: Orthopedics;  Laterality: Right;  RNFA PLEASE   AUGMENTATION MAMMAPLASTY Bilateral 2000 or 2001 unsure    Left Implant has busted and is no longer visible   BACK SURGERY  2015   fusion , Dr Noel Gerold at [facility]; has 2 metal rods in place , fusion from t2 to s1    BACK SURGERY  01/2017   Dr Sharolyn Douglas ; repair of cracked titanium rod in back lumbar    BREAST EXCISIONAL BIOPSY Left    30 yrs ago   bunionectomy  Bilateral 1980s   multiple    COLONOSCOPY     with polypectomy ; q 5 years    FOOT SURGERY  07/2011   SHOULDER SURGERY Left 2013   rotator cuff    TOTAL HIP ARTHROPLASTY Right 05/08/2017   Procedure:  RIGHT TOTAL HIP ARTHROPLASTY ANTERIOR APPROACH;  Surgeon: Durene Romans, MD;  Location: [facility];  Service: Orthopedics;  Laterality: Right;  70 mins   TOTAL KNEE ARTHROPLASTY Right 06/06/2022   Procedure: RIGHT TOTAL KNEE ARTHROPLASTY;  Surgeon: Kathryne Hitch, MD;  Location: [facility] OR;  Service: Orthopedics;  Laterality: Right;   Patient Active Problem List   Diagnosis Date Noted   Status post total right knee replacement 06/06/2022   Leg length discrepancy, Right 10/07/2020   History of revision of total replacement of right hip joint 10/07/2020   Multinodular goiter 05/18/2020   Hip contracture, unspecified laterality 07/02/2018   Overweight (BMI 25.0-29.9) 05/09/2017   S/P right THA, AA 05/08/2017   Chronic bilateral low back pain without sciatica 04/05/2015   Disorder of sacroiliac joint 12/22/2014   Chronic low back pain 12/22/2014   Acquired scoliosis 11/27/2011   Lumbosacral spondylosis without myelopathy 05/22/2011    PCP: Jarrett Soho, MD  REFERRING PROVIDER: Kathryne Hitch, MD  REFERRING DIAG:  [phone] (ICD-10-CM) - Status post total right knee replacement  M17.11 (ICD-10-CM) - Unilateral primary osteoarthritis, right knee  THERAPY DIAG:  Acute pain of right knee  Stiffness of right knee, not elsewhere classified  Muscle weakness (generalized)  Other abnormalities of gait and mobility  Localized edema  Rationale for Evaluation and Treatment: Rehabilitation  ONSET DATE: 06/06/2022 TKA  SUBJECTIVE:   SUBJECTIVE STATEMENT: She saw Rexene Edison, PA prior to PT today.  He wants knee flexing more.   PERTINENT HISTORY: Right TKA 06/06/2022, OA, leg length discrepancy, right Total hip replacement anterior approach with revision 2022, hip contracture, chronic LBP with surgery, acquired scoliosis, HTN, ankle surgery lengthen Achilles,   PAIN:  NPRS scale: today  6/10 and since last PT  1/10 - 6/10 Pain location: right knee along joint line Pain  description: at rest dull ache and intermittent spasm & sharp pain Aggravating factors: up too long, first moving if knee has gotten stiffen Relieving factors: Oxy, muscle relax & ice  PRECAUTIONS: Fall  WEIGHT BEARING RESTRICTIONS: No  FALLS:  Has patient fallen in last 6 months? No  LIVING ENVIRONMENT: Lives with: lives with their spouse and 2 dogs 12-14# Lives in: [address] house with master bedroom & bathroom main level Stairs: Yes: Internal: 16-18 steps; on left going up and External: 3 steps; none Has following equipment at home: Single point cane, Environmental consultant - 2 wheeled, Marine scientist  OCCUPATION: retired from Engineer, [employer] (consulting),   PLOF: Independent  PATIENT GOALS:  walk & be active throughout her day, go upstairs to sewing room, sewing machine uses RLE  Next MD visit: 08/10/2022  OBJECTIVE:  DIAGNOSTIC FINDINGS: 06/06/2022 post op X-ray showed right knee arthroplasty without immediate postoperative complication.  PATIENT SURVEYS:  07/12/2022: visit 8 FOTO 50%  06/21/2022:   FOTO intake:  29%  predicted:  57%  COGNITION: Overall cognitive status: WFL    SENSATION: WFL  EDEMA:  06/21/2022:   Circumferential:  LLE: above knee 43cm,  around knee 39.5cm, below knee 35 cm RLE: above knee 45.7cm,  around knee 40.8cm, below knee 38.9 cm  POSTURE: 06/21/2022:   rounded shoulders, forward head, flexed trunk , and weight shift left  PALPATION: 06/21/2022:   Right knee tenderness along joint line, right ITB tightness & tenderness  LOWER EXTREMITY ROM:   ROM Right eval Right 06/23/22 Right 06/28/22 Right 07/05/22 Right 07/12/22  Hip flexion       Hip extension       Hip abduction       Hip adduction       Hip internal rotation       Hip external rotation       Knee flexion Seated A:84* P: 93* Active 98 Seated A: 100* P: 104* Supine P: 116* Seated A: 111*  Knee extension Supine P: -7* Active -4 Standing A: -4* Supine P: -1* Seated A: LAQ -9* Standing A:-2*  Ankle  dorsiflexion       Ankle plantarflexion       Ankle inversion       Ankle eversion        (Blank rows = not tested)  LOWER EXTREMITY MMT:  MMT Right eval  Hip flexion   Hip extension   Hip abduction   Hip adduction   Hip internal rotation   Hip external rotation   Knee flexion 3-/5  Knee extension 3-/5  Ankle dorsiflexion   Ankle plantarflexion   Ankle inversion   Ankle eversion    (Blank rows = not tested)   FUNCTIONAL TESTS:  06/21/2022:   22 inch chair transfer: requires use of BUEs  on armrest, RLE extended with minimal assistance and requires RW for stabilization  GAIT: 06/21/2022:   Distance walked: 100' Assistive device utilized: Environmental consultant - 2 wheeled Level of assistance: SBA Comments: RLE decreased stance duration, abduction, right knee flexed in stance with minimal increase in flexion for swing.    TODAY'S TREATMENT                                                                          DATE: 07/17/2022: Therapeutic Exercise: Precor recumbent bike seat 6 level 2 full revolutions for knee range 8 min Leg press 106# 10 reps 2 sets; single LE 50# 10 reps 2 sets ea LE. Tandem stance on floor RLE in front & in back 30 sec with sink / chair back for safety. Stepping towards 3 target cones (ant-lat, lat & post-lat) with stance LE close to sink knee flexion on outward motion & extension on inward motion 3 reps ea LE with light stance side UE support. PT demo & verbal cues on technique.  Standing TKE with green theraband 10 reps 3 sec hold 1st set just TKE, 2nd set contralateral heel raise / knee flexion for BLEs. Squat wide stance sink BUE support over chair with green T-band figure 8 around knees 10 reps 2 sets PT demo & verbal cues on technique. Pt added to HEP with HO. Pt verbalized understanding. Seated LAQ & active knee flexion with contralateral LE opposing motion 5 sec hold for 2 min.    Manual therapy: PROM with overpressure right knee flexion   Vaso to right knee  34* medium compression 10 min with LE elevation.  07/12/2022: Therapeutic Exercise: Leg press 100# 10 reps 2 sets; single LE 43# 10 reps 2 sets ea LE. Tandem stance on floor RLE in front & in back 30 sec with sink / chair back for safety. Stepping towards 3 target cones (ant-lat, lat & post-lat) with stance LE close to sink knee flexion on outward motion & extension on inward motion 3 reps ea LE with light stance side UE support. PT demo & verbal cues on technique.  Standing TKE with green theraband 10 reps 3 sec hold 1st set just TKE, 2nd set contralateral heel raise / knee flexion for BLEs. Squat wide stance sink BUE support over chair 10 reps 2 sets PT demo & verbal cues on technique. Pt added to HEP with HO. Pt verbalized understanding. Seated ext stretch with LLE in 2nd chair with 5# hang 1 min, then left foot at chair edge for flexion 1 min. 2 sets. PT demo & verbal cues on technique. Pt added to HEP with HO. Pt verbalized understanding.   Therapeutic Activities: Pt amb with walking stick in RUE with cues on upright trunk and LLE knee ext in stance/flex in swing. PT educated in progressing decrease of UE support by distance & frequency for example no UE within room or home, single UE like walking stick medium distances and longer distances with BUE support like walker. Pt verbalized understanding.   Manual therapy: PROM with overpressure right knee flexion   Vaso to right knee 34* medium compression 10 min with LE elevation.   07/10/2022: Therapeutic Exercise: Precor recumbent bike seat 6 level 1 full revolutions  for knee range 8 min Leg press 100# 10 reps 2 sets; single LE 43# 10 reps 2 sets ea LE. Tandem stance on foam RLE in front & in back 30 sec ea intermittent touch bar. Gastroc stretch incline board 30 sec hold 2 reps Standing heel raises on incline board BUE support 15 reps Standing TKE with green theraband 10 reps 3 sec hold 1st set just TKE, 2nd set contralateral heel raise /  knee flexion for BLEs. LLE  Hamstring stretch long sit with strap 30 sec hold 2 reps. LLE  Quad stretch in hooklying with strap knee flexion 30 sec hold 2 reps. LAQ with LLE over edge of bed so hip ext. PT making hamstring tightness not limit full range. 10 reps 2 sets.   Self-Care: PT used knee model to explain TKA surgery. Pt verbalized better understanding. PT demo & verbal cues in ice massage. Pt verbalized & return demo understanding.    Therapeutic Activities: Pt amb with walking stick in RUE with cues on upright trunk and LLE knee ext in stance/flex in swing. PT educated in progressing decrease of UE support by distance & frequency for example no UE within room or home, single UE like walking stick medium distances and longer distances with BUE support like walker. Pt verbalized understanding.   Manual therapy: PROM with overpressure right knee flexion     HOME EXERCISE PROGRAM: Access Code: [email or web address] URL: [email or web address] Date: 07/17/2022 Prepared by: Vladimir Faster  Exercises - Ankle Alphabet in Elevation  - 2-4 x daily - 7 x weekly - 1 sets - 1 reps - supine quad set with towel roll under ankle  - 2-3 x daily - 7 x weekly - 2-3 sets - 10 reps - 5 seconds hold - Supine Heel Slide with Strap  - 2-3 x daily - 7 x weekly - 2-3 sets - 10 reps - 5 seconds hold - Supine Knee Extension Strengthening  - 2-3 x daily - 7 x weekly - 2-3 sets - 10 reps - 5 seconds hold - Supine Straight Leg Raises  - 2-3 x daily - 7 x weekly - 2-3 sets - 10 reps - 5 seconds hold - Seated Knee Flexion Extension AROM   - 2-4 x daily - 7 x weekly - 2-3 sets - 10 reps - 5 seconds hold - Seated straight leg lifts  - 2-3 x daily - 7 x weekly - 2-3 sets - 10 reps - 5 seconds hold - Seated Hamstring Stretch with Strap  - 2-4 x daily - 7 x weekly - 1 sets - 3 reps - 20-30 seconds hold - Seated Long Arc Quad  - 2-4 x daily - 7 x weekly - 2-3 sets - 10 reps - 5 seconds hold - Seated Knee Flexion  AAROM  - 3 x daily - 7 x weekly - 1 sets - 10 reps - 10 seconds hold - Seated Table Hamstring Stretch  - 2 x daily - 7 x weekly - 1 sets - 3 reps - 30 seconds hold - Supine Quadriceps Stretch with Strap on Table  - 2 x daily - 7 x weekly - 1 sets - 3 reps - 30 seconds hold - standing calf stretch with forefoot on small step or brick  - 2 x daily - 7 x weekly - 1 sets - 3 reps - 30 seconds hold - Supine ITB Stretch with Strap  - 1 x daily - 7 x weekly - 1 sets - 3 reps -  30 seconds hold - squat over chair like "dirty toilet"  - 1 x daily - 7 x weekly - 2-3 sets - 10 reps - 5 seconds hold - Seated Knee Extension Stretch with Chair  - 1-3 x daily - 7 x weekly - 1 sets - 2-3 reps - 10-20 seconds hold - standing knee extension  - 1 x daily - 7 x weekly - 2 sets - 10 reps - 5 seconds hold   ASSESSMENT: CLINICAL IMPRESSION: Patient appears to understand updated HEP from last session and today.  She is limited by back & knee pain today but continues to slowly improve standing functional activities. Patient continues to benefit from skilled PT s/p TKA.  OBJECTIVE IMPAIRMENTS: Abnormal gait, decreased activity tolerance, decreased balance, decreased endurance, decreased knowledge of condition, decreased knowledge of use of DME, decreased mobility, difficulty walking, decreased ROM, decreased strength, increased edema, increased muscle spasms, postural dysfunction, and pain.   ACTIVITY LIMITATIONS: carrying, lifting, bending, sitting, standing, squatting, sleeping, stairs, transfers, bed mobility, and locomotion level  PARTICIPATION LIMITATIONS: meal prep, cleaning, laundry, driving, and community activity  PERSONAL FACTORS: Age, Fitness, Past/current experiences, Time since onset of injury/illness/exacerbation, and 3+ comorbidities: see PMH  are also affecting patient's functional outcome.   REHAB POTENTIAL: Good  CLINICAL DECISION MAKING: Stable/uncomplicated  EVALUATION COMPLEXITY:  Low   GOALS: Goals reviewed with patient? Yes  SHORT TERM GOALS: (target date for Short term goals 07/21/2022)   1.  Patient will demonstrate independent use of home exercise program to maintain progress from in clinic treatments.  Goal status: MET 07/17/2022  2. PROM right knee ext -2* and flexion 100* Goal status:  MET 07/05/2022  3. Interim FOTO >/= 40%  Goal status: MET 07/12/2022  LONG TERM GOALS: (target dates for all long term goals  09/08/2022 )   1. Patient will demonstrate/report pain at worst less than or equal to 2/10 to facilitate minimal limitation in daily activity secondary to pain symptoms.  Goal status: Ongoing 07/03/2022   2. Patient will demonstrate independent use of home exercise program to facilitate ability to maintain/progress functional gains from skilled physical therapy services.  Goal status: Ongoing 07/03/2022   3. Patient will demonstrate FOTO outcome > or = 57 % to indicate reduced disability due to condition.  Goal status: Ongoing 07/03/2022   4.  Patient will demonstrate right knee MMT >/= 4/5 throughout to faciltiate usual transfers, stairs, squatting at [facility] for daily life.   Goal status: Ongoing 07/03/2022   5.  Patient AROM right knee ext 0* and flexion 100* Goal status: Ongoing 07/03/2022   6.  patient amb >500' & neg ramp/curb with LRAD modified independent.  Goal status: Ongoing 07/03/2022    PLAN:  PT FREQUENCY:  2x/week  PT DURATION: 12 weeks  PLANNED INTERVENTIONS: Therapeutic exercises, Therapeutic activity, Neuro Muscular re-education, Balance training, Gait training, Patient/Family education, Joint mobilization, Stair training, DME instructions, Dry Needling, Electrical stimulation, Traction, Cryotherapy, vasopneumatic deviceMoist heat, Taping, Ultrasound, Ionotophoresis 4mg /ml Dexamethasone, and aquatic therapy, Manual therapy.  All included unless contraindicated  PLAN FOR NEXT SESSION: continue  manual therapy & exercise for  range, continue to add standing balance & functional activities, vaso for edema.     Vladimir Faster, PT, DPT 07/17/2022, 4:31 PM

## 2022-07-17 NOTE — Progress Notes (Signed)
  HPI: [name] comes in today status post right total knee arthroplasty 06/06/2022.  She states that she is having 8 out of 10 pain in the knee.  She is going outpatient physical therapy.  She is using Celebrex mainly for pain relief.  She denies any longer using her home medications.  Physical exam: General: Well-developed well-nourished female no acute distress mood and affect appropriate. Right knee: 0 to 110 degrees.  Calf supple nontender.  Dorsiflexion plantarflexion right ankle intact.  Surgical incisions healing well no signs of   Impression: Status post right total knee arthroplasty 05/29/2022  Plan: She will continue to work with therapy on range of motion and strengthening.  Follow-up with [place] in 4 weeks sooner if there is any questions concerns.  Scar tissue mobilization encouraged.

## 2022-07-19 ENCOUNTER — Encounter: Payer: Self-pay | Admitting: Physical Therapy

## 2022-07-19 ENCOUNTER — Ambulatory Visit: Payer: Medicare Other | Admitting: Physical Therapy

## 2022-07-19 DIAGNOSIS — M6281 Muscle weakness (generalized): Secondary | ICD-10-CM

## 2022-07-19 DIAGNOSIS — R2689 Other abnormalities of gait and mobility: Secondary | ICD-10-CM | POA: Diagnosis not present

## 2022-07-19 DIAGNOSIS — M25661 Stiffness of right knee, not elsewhere classified: Secondary | ICD-10-CM | POA: Diagnosis not present

## 2022-07-19 DIAGNOSIS — R2681 Unsteadiness on feet: Secondary | ICD-10-CM

## 2022-07-19 DIAGNOSIS — M25561 Pain in right knee: Secondary | ICD-10-CM

## 2022-07-19 DIAGNOSIS — R600 Localized edema: Secondary | ICD-10-CM | POA: Diagnosis not present

## 2022-07-19 NOTE — Therapy (Signed)
OUTPATIENT PHYSICAL THERAPY LOWER EXTREMITY TREATMENT   Patient Name: CHANA LINDSTROM MRN: [id number] DOB:[birth year], 77 y.o., female Today's Date: 07/19/2022   END OF SESSION:  PT End of Session - 07/19/22 1150     Visit Number 10    Number of Visits 24    Date for PT Re-Evaluation 09/08/22    Authorization Type Medicare A&B and [employer] Fed emp    Progress Note Due on Visit 18    PT Start Time 1145    PT Stop Time 1240    PT Time Calculation (min) 55 min    Activity Tolerance Patient tolerated treatment well;Patient limited by pain;No increased pain    Behavior During Therapy [facility] for tasks assessed/performed                   Past Medical History:  Diagnosis Date   Anemia    Congenital musculoskeletal deformity of spine    Depression    Headache    Hypertension    Incontinence of bowel 09/2013   Lumbago    Lumbosacral spondylosis without myelopathy    Sleep apnea    Sleep disorder    uses gabapentin at bedtime    Spinal stenosis, lumbar region, without neurogenic claudication    Past Surgical History:  Procedure Laterality Date   ANKLE SURGERY Right 2015   lengthened the achilles    ANTERIOR HIP REVISION Right 10/07/2020   Procedure: RIGHT HIP REVISION OF  HIP BALL/POLY-LINER;  Surgeon: Kathryne Hitch, MD;  Location: [facility] OR;  Service: Orthopedics;  Laterality: Right;  RNFA PLEASE   AUGMENTATION MAMMAPLASTY Bilateral 2000 or 2001 unsure    Left Implant has busted and is no longer visible   BACK SURGERY  2015   fusion , Dr Noel Gerold at [facility]; has 2 metal rods in place , fusion from t2 to s1    BACK SURGERY  01/2017   Dr Sharolyn Douglas ; repair of cracked titanium rod in back lumbar    BREAST EXCISIONAL BIOPSY Left    30 yrs ago   bunionectomy  Bilateral 1980s   multiple    COLONOSCOPY     with polypectomy ; q 5 years    FOOT SURGERY  07/2011   SHOULDER SURGERY Left 2013   rotator cuff    TOTAL HIP ARTHROPLASTY Right 05/08/2017    Procedure: RIGHT TOTAL HIP ARTHROPLASTY ANTERIOR APPROACH;  Surgeon: Durene Romans, MD;  Location: [facility];  Service: Orthopedics;  Laterality: Right;  70 mins   TOTAL KNEE ARTHROPLASTY Right 06/06/2022   Procedure: RIGHT TOTAL KNEE ARTHROPLASTY;  Surgeon: Kathryne Hitch, MD;  Location: [facility] OR;  Service: Orthopedics;  Laterality: Right;   Patient Active Problem List   Diagnosis Date Noted   Status post total right knee replacement 06/06/2022   Leg length discrepancy, Right 10/07/2020   History of revision of total replacement of right hip joint 10/07/2020   Multinodular goiter 05/18/2020   Hip contracture, unspecified laterality 07/02/2018   Overweight (BMI 25.0-29.9) 05/09/2017   S/P right THA, AA 05/08/2017   Chronic bilateral low back pain without sciatica 04/05/2015   Disorder of sacroiliac joint 12/22/2014   Chronic low back pain 12/22/2014   Acquired scoliosis 11/27/2011   Lumbosacral spondylosis without myelopathy 05/22/2011    PCP: Jarrett Soho, MD  REFERRING PROVIDER: Kathryne Hitch, MD  REFERRING DIAG:  [id number] (ICD-10-CM) - Status post total right knee replacement  M17.11 (ICD-10-CM) - Unilateral primary osteoarthritis, right knee  THERAPY DIAG:  Acute pain of right knee  Stiffness of right knee, not elsewhere classified  Muscle weakness (generalized)  Other abnormalities of gait and mobility  Localized edema  Unsteadiness on feet  Rationale for Evaluation and Treatment: Rehabilitation  ONSET DATE: 06/06/2022 TKA  SUBJECTIVE:   SUBJECTIVE STATEMENT: She did not get to do new exercises yet and wants to go back over it.  PERTINENT HISTORY: Right TKA 06/06/2022, OA, leg length discrepancy, right Total hip replacement anterior approach with revision 2022, hip contracture, chronic LBP with surgery, acquired scoliosis, HTN, ankle surgery lengthen Achilles,   PAIN:  NPRS scale: today  5-6/10 and since last PT  1/10 - 6/10 Pain location:  right knee along joint line Pain description: at rest dull ache and intermittent spasm & sharp pain Aggravating factors: up too long, first moving if knee has gotten stiffen Relieving factors: Oxy, muscle relax & ice  PRECAUTIONS: Fall  WEIGHT BEARING RESTRICTIONS: No  FALLS:  Has patient fallen in last 6 months? No  LIVING ENVIRONMENT: Lives with: lives with their spouse and 2 dogs 12-14# Lives in: [address] house with master bedroom & bathroom main level Stairs: Yes: Internal: 16-18 steps; on left going up and External: 3 steps; none Has following equipment at home: Single point cane, Environmental consultant - 2 wheeled, Marine scientist  OCCUPATION: retired from Engineer, [employer] (consulting),   PLOF: Independent  PATIENT GOALS:  walk & be active throughout her day, go upstairs to sewing room, sewing machine uses RLE  Next MD visit: 08/10/2022  OBJECTIVE:  DIAGNOSTIC FINDINGS: 06/06/2022 post op X-ray showed right knee arthroplasty without immediate postoperative complication.  PATIENT SURVEYS:  07/12/2022: visit 8 FOTO 50%  06/21/2022:   FOTO intake:  29%  predicted:  57%  COGNITION: Overall cognitive status: WFL    SENSATION: WFL  EDEMA:  06/21/2022:   Circumferential:  LLE: above knee 43cm,  around knee 39.5cm, below knee 35 cm RLE: above knee 45.7cm,  around knee 40.8cm, below knee 38.9 cm  POSTURE: 06/21/2022:   rounded shoulders, forward head, flexed trunk , and weight shift left  PALPATION: 06/21/2022:   Right knee tenderness along joint line, right ITB tightness & tenderness  LOWER EXTREMITY ROM:   ROM Right eval Right 06/23/22 Right 06/28/22 Right 07/05/22 Right 07/12/22 Right 07/19/22  Hip flexion        Hip extension        Hip abduction        Hip adduction        Hip internal rotation        Hip external rotation        Knee flexion Seated A:84* P: 93* Active 98 Seated A: 100* P: 104* Supine P: 116* Seated A: 111*   Knee extension Supine P: -7* Active -4 Standing A: -4*  Supine P: -1* Seated A: LAQ -9* Standing A:-2* Seated A: -5*  Ankle dorsiflexion        Ankle plantarflexion        Ankle inversion        Ankle eversion         (Blank rows = not tested)  LOWER EXTREMITY MMT:  MMT Right eval  Hip flexion   Hip extension   Hip abduction   Hip adduction   Hip internal rotation   Hip external rotation   Knee flexion 3-/5  Knee extension 3-/5  Ankle dorsiflexion   Ankle plantarflexion   Ankle inversion   Ankle eversion    (Blank  rows = not tested)   FUNCTIONAL TESTS:  06/21/2022:   22 inch chair transfer: requires use of BUEs on armrest, RLE extended with minimal assistance and requires RW for stabilization  GAIT: 06/21/2022:   Distance walked: 100' Assistive device utilized: Environmental consultant - 2 wheeled Level of assistance: SBA Comments: RLE decreased stance duration, abduction, right knee flexed in stance with minimal increase in flexion for swing.    TODAY'S TREATMENT                                                                          DATE: 07/19/2022: Therapeutic Exercise: Precor recumbent bike seat 6 level 2 full revolutions for knee range 8 min Pt questioning using swing as PA recommended but she does have a swing. PT demo & verbal cues on using rolling desk chair holding table or door frame, feet planted on floor and rolling / pulling chair forward flexing knee. Pt return demo & verbalized understanding.   Scooting rolling desk chair forward with single leg knee flexion using BLEs 30' with verbal cues Standing TKE with green theraband 10 reps 3 sec hold contralateral heel raise / knee flexion for BLEs. Walking stick support with cues for upright posture.  Pt verbalized better understanding for HEP. Squat wide stance sink BUE support over chair with green T-band figure 8 around knees 10 reps 2 sets PT demo & verbal cues on technique. Pt added to HEP with HO. Pt verbalized understanding. Standing walking stick RUE & table top LUE upright  posture tapping cones alternating LEs 10 reps Standing walking stick RUE & table top LUE upright posture stepping over 6" hurdle alternating LEs 10 reps Seated hamstring curl green T-band 10 reps 2 sets Hamstring stretch RLE long sit with strap 30 sec 2 reps Seated LAQ with strap assisted end range 10 reps     Vaso to right knee 34* medium compression 10 min with LE elevation.  07/17/2022: Therapeutic Exercise: Precor recumbent bike seat 6 level 2 full revolutions for knee range 8 min Leg press 106# 10 reps 2 sets; single LE 50# 10 reps 2 sets ea LE. Tandem stance on floor RLE in front & in back 30 sec with sink / chair back for safety. Stepping towards 3 target cones (ant-lat, lat & post-lat) with stance LE close to sink knee flexion on outward motion & extension on inward motion 3 reps ea LE with light stance side UE support. PT demo & verbal cues on technique.  Standing TKE with green theraband 10 reps 3 sec hold 1st set just TKE, 2nd set contralateral heel raise / knee flexion for BLEs. Squat wide stance sink BUE support over chair with green T-band figure 8 around knees 10 reps 2 sets PT demo & verbal cues on technique. Pt added to HEP with HO. Pt verbalized understanding. Seated LAQ & active knee flexion with contralateral LE opposing motion 5 sec hold for 2 min.    Manual therapy: PROM with overpressure right knee flexion   Vaso to right knee 34* medium compression 10 min with LE elevation.  07/12/2022: Therapeutic Exercise: Leg press 100# 10 reps 2 sets; single LE 43# 10 reps 2 sets ea LE. Tandem stance on floor RLE in  front & in back 30 sec with sink / chair back for safety. Stepping towards 3 target cones (ant-lat, lat & post-lat) with stance LE close to sink knee flexion on outward motion & extension on inward motion 3 reps ea LE with light stance side UE support. PT demo & verbal cues on technique.  Standing TKE with green theraband 10 reps 3 sec hold 1st set just TKE, 2nd set  contralateral heel raise / knee flexion for BLEs. Squat wide stance sink BUE support over chair 10 reps 2 sets PT demo & verbal cues on technique. Pt added to HEP with HO. Pt verbalized understanding. Seated ext stretch with LLE in 2nd chair with 5# hang 1 min, then left foot at chair edge for flexion 1 min. 2 sets. PT demo & verbal cues on technique. Pt added to HEP with HO. Pt verbalized understanding.   Therapeutic Activities: Pt amb with walking stick in RUE with cues on upright trunk and LLE knee ext in stance/flex in swing. PT educated in progressing decrease of UE support by distance & frequency for example no UE within room or home, single UE like walking stick medium distances and longer distances with BUE support like walker. Pt verbalized understanding.   Manual therapy: PROM with overpressure right knee flexion   Vaso to right knee 34* medium compression 10 min with LE elevation.    HOME EXERCISE PROGRAM: Access Code: [email or web address] URL: [email or web address] Date: 07/17/2022 Prepared by: Vladimir Faster  Exercises - Ankle Alphabet in Elevation  - 2-4 x daily - 7 x weekly - 1 sets - 1 reps - supine quad set with towel roll under ankle  - 2-3 x daily - 7 x weekly - 2-3 sets - 10 reps - 5 seconds hold - Supine Heel Slide with Strap  - 2-3 x daily - 7 x weekly - 2-3 sets - 10 reps - 5 seconds hold - Supine Knee Extension Strengthening  - 2-3 x daily - 7 x weekly - 2-3 sets - 10 reps - 5 seconds hold - Supine Straight Leg Raises  - 2-3 x daily - 7 x weekly - 2-3 sets - 10 reps - 5 seconds hold - Seated Knee Flexion Extension AROM   - 2-4 x daily - 7 x weekly - 2-3 sets - 10 reps - 5 seconds hold - Seated straight leg lifts  - 2-3 x daily - 7 x weekly - 2-3 sets - 10 reps - 5 seconds hold - Seated Hamstring Stretch with Strap  - 2-4 x daily - 7 x weekly - 1 sets - 3 reps - 20-30 seconds hold - Seated Long Arc Quad  - 2-4 x daily - 7 x weekly - 2-3 sets - 10 reps - 5  seconds hold - Seated Knee Flexion AAROM  - 3 x daily - 7 x weekly - 1 sets - 10 reps - 10 seconds hold - Seated Table Hamstring Stretch  - 2 x daily - 7 x weekly - 1 sets - 3 reps - 30 seconds hold - Supine Quadriceps Stretch with Strap on Table  - 2 x daily - 7 x weekly - 1 sets - 3 reps - 30 seconds hold - standing calf stretch with forefoot on small step or brick  - 2 x daily - 7 x weekly - 1 sets - 3 reps - 30 seconds hold - Supine ITB Stretch with Strap  - 1 x daily - 7 x weekly - 1 sets -  3 reps - 30 seconds hold - squat over chair like "dirty toilet"  - 1 x daily - 7 x weekly - 2-3 sets - 10 reps - 5 seconds hold - Seated Knee Extension Stretch with Chair  - 1-3 x daily - 7 x weekly - 1 sets - 2-3 reps - 10-20 seconds hold - standing knee extension  - 1 x daily - 7 x weekly - 2 sets - 10 reps - 5 seconds hold   ASSESSMENT: CLINICAL IMPRESSION: PT reviewed updated HEP which patient reports better understanding.  PT included more exercises for active, functional knee flexion which challenged pt.  Patient continues to benefit from skilled PT s/p TKA. OBJECTIVE IMPAIRMENTS: Abnormal gait, decreased activity tolerance, decreased balance, decreased endurance, decreased knowledge of condition, decreased knowledge of use of DME, decreased mobility, difficulty walking, decreased ROM, decreased strength, increased edema, increased muscle spasms, postural dysfunction, and pain.   ACTIVITY LIMITATIONS: carrying, lifting, bending, sitting, standing, squatting, sleeping, stairs, transfers, bed mobility, and locomotion level  PARTICIPATION LIMITATIONS: meal prep, cleaning, laundry, driving, and community activity  PERSONAL FACTORS: Age, Fitness, Past/current experiences, Time since onset of injury/illness/exacerbation, and 3+ comorbidities: see PMH  are also affecting patient's functional outcome.   REHAB POTENTIAL: Good  CLINICAL DECISION MAKING: Stable/uncomplicated  EVALUATION COMPLEXITY:  Low   GOALS: Goals reviewed with patient? Yes  SHORT TERM GOALS: (target date for Short term goals 07/21/2022)   1.  Patient will demonstrate independent use of home exercise program to maintain progress from in clinic treatments.  Goal status: MET 07/17/2022  2. PROM right knee ext -2* and flexion 100* Goal status:  MET 07/05/2022  3. Interim FOTO >/= 40%  Goal status: MET 07/12/2022  LONG TERM GOALS: (target dates for all long term goals  09/08/2022 )   1. Patient will demonstrate/report pain at worst less than or equal to 2/10 to facilitate minimal limitation in daily activity secondary to pain symptoms.  Goal status: Ongoing 07/19/2022   2. Patient will demonstrate independent use of home exercise program to facilitate ability to maintain/progress functional gains from skilled physical therapy services.  Goal status: Ongoing 07/19/2022   3. Patient will demonstrate FOTO outcome > or = 57 % to indicate reduced disability due to condition.  Goal status: OOngoing 07/19/2022   4.  Patient will demonstrate right knee MMT >/= 4/5 throughout to faciltiate usual transfers, stairs, squatting at [facility] for daily life.   Goal status: Ongoing 07/19/2022   5.  Patient AROM right knee ext 0* and flexion 100* Goal status: Ongoing 07/19/2022   6.  patient amb >500' & neg ramp/curb with LRAD modified independent.  Goal status: Ongoing 07/19/2022    PLAN:  PT FREQUENCY:  2x/week  PT DURATION: 12 weeks  PLANNED INTERVENTIONS: Therapeutic exercises, Therapeutic activity, Neuro Muscular re-education, Balance training, Gait training, Patient/Family education, Joint mobilization, Stair training, DME instructions, Dry Needling, Electrical stimulation, Traction, Cryotherapy, vasopneumatic deviceMoist heat, Taping, Ultrasound, Ionotophoresis 4mg /ml Dexamethasone, and aquatic therapy, Manual therapy.  All included unless contraindicated  PLAN FOR NEXT SESSION:   manual therapy & exercise for range,  continue to add standing balance & functional activities, vaso for edema.     Vladimir Faster, PT, DPT 07/19/2022, 12:49 PM

## 2022-07-24 ENCOUNTER — Ambulatory Visit: Payer: Medicare Other | Admitting: Physical Therapy

## 2022-07-24 ENCOUNTER — Encounter: Payer: Self-pay | Admitting: Physical Therapy

## 2022-07-24 DIAGNOSIS — R2681 Unsteadiness on feet: Secondary | ICD-10-CM | POA: Diagnosis not present

## 2022-07-24 DIAGNOSIS — R2689 Other abnormalities of gait and mobility: Secondary | ICD-10-CM | POA: Diagnosis not present

## 2022-07-24 DIAGNOSIS — M25561 Pain in right knee: Secondary | ICD-10-CM | POA: Diagnosis not present

## 2022-07-24 DIAGNOSIS — M6281 Muscle weakness (generalized): Secondary | ICD-10-CM

## 2022-07-24 DIAGNOSIS — M25661 Stiffness of right knee, not elsewhere classified: Secondary | ICD-10-CM | POA: Diagnosis not present

## 2022-07-24 DIAGNOSIS — R600 Localized edema: Secondary | ICD-10-CM | POA: Diagnosis not present

## 2022-07-24 NOTE — Therapy (Signed)
OUTPATIENT PHYSICAL THERAPY LOWER EXTREMITY TREATMENT   Patient Name: [name] MRN: [id number] DOB:02/14/1945, 77 y.o., female Today's Date: 07/24/2022   END OF SESSION:  PT End of Session - 07/24/22 1150     Visit Number 11    Number of Visits 24    Date for PT Re-Evaluation 09/08/22    Authorization Type Medicare A&B and [employer] Fed emp    Progress Note Due on Visit 18    PT Start Time 1146    PT Stop Time 1236    PT Time Calculation (min) 50 min    Activity Tolerance Patient tolerated treatment well;Patient limited by pain;No increased pain    Behavior During Therapy [facility] for tasks assessed/performed                    Past Medical History:  Diagnosis Date   Anemia    Congenital musculoskeletal deformity of spine    Depression    Headache    Hypertension    Incontinence of bowel 09/2013   Lumbago    Lumbosacral spondylosis without myelopathy    Sleep apnea    Sleep disorder    uses gabapentin at bedtime    Spinal stenosis, lumbar region, without neurogenic claudication    Past Surgical History:  Procedure Laterality Date   ANKLE SURGERY Right 2015   lengthened the achilles    ANTERIOR HIP REVISION Right 10/07/2020   Procedure: RIGHT HIP REVISION OF  HIP BALL/POLY-LINER;  Surgeon: Kathryne Hitch, MD;  Location: [facility] OR;  Service: Orthopedics;  Laterality: Right;  RNFA PLEASE   AUGMENTATION MAMMAPLASTY Bilateral 2000 or 2001 unsure    Left Implant has busted and is no longer visible   BACK SURGERY  2015   fusion , Dr Noel Gerold at [facility]; has 2 metal rods in place , fusion from t2 to s1    BACK SURGERY  01/2017   Dr Sharolyn Douglas ; repair of cracked titanium rod in back lumbar    BREAST EXCISIONAL BIOPSY Left    30 yrs ago   bunionectomy  Bilateral 1980s   multiple    COLONOSCOPY     with polypectomy ; q 5 years    FOOT SURGERY  07/2011   SHOULDER SURGERY Left 2013   rotator cuff    TOTAL HIP ARTHROPLASTY Right 05/08/2017    Procedure: RIGHT TOTAL HIP ARTHROPLASTY ANTERIOR APPROACH;  Surgeon: Durene Romans, MD;  Location: [facility];  Service: Orthopedics;  Laterality: Right;  70 mins   TOTAL KNEE ARTHROPLASTY Right 06/06/2022   Procedure: RIGHT TOTAL KNEE ARTHROPLASTY;  Surgeon: Kathryne Hitch, MD;  Location: [facility] OR;  Service: Orthopedics;  Laterality: Right;   Patient Active Problem List   Diagnosis Date Noted   Status post total right knee replacement 06/06/2022   Leg length discrepancy, Right 10/07/2020   History of revision of total replacement of right hip joint 10/07/2020   Multinodular goiter 05/18/2020   Hip contracture, unspecified laterality 07/02/2018   Overweight (BMI 25.0-29.9) 05/09/2017   S/P right THA, AA 05/08/2017   Chronic bilateral low back pain without sciatica 04/05/2015   Disorder of sacroiliac joint 12/22/2014   Chronic low back pain 12/22/2014   Acquired scoliosis 11/27/2011   Lumbosacral spondylosis without myelopathy 05/22/2011    PCP: Jarrett Soho, MD  REFERRING PROVIDER: Kathryne Hitch, MD  REFERRING DIAG:  [phone] (ICD-10-CM) - Status post total right knee replacement  M17.11 (ICD-10-CM) - Unilateral primary osteoarthritis, right knee  THERAPY DIAG:  Acute pain of right knee  Stiffness of right knee, not elsewhere classified  Muscle weakness (generalized)  Other abnormalities of gait and mobility  Localized edema  Unsteadiness on feet  Rationale for Evaluation and Treatment: Rehabilitation  ONSET DATE: 06/06/2022 TKA  SUBJECTIVE:   SUBJECTIVE STATEMENT: She was able to go up & down stairs alternating without issue.  PERTINENT HISTORY: Right TKA 06/06/2022, OA, leg length discrepancy, right Total hip replacement anterior approach with revision 2022, hip contracture, chronic LBP with surgery, acquired scoliosis, HTN, ankle surgery lengthen Achilles,   PAIN:  NPRS scale: today 3/10 and since last PT 0/10 - 5/10 during day, 8-9/10 at night  more in ankle Pain location: right knee along joint line Pain description: at rest dull ache and intermittent spasm & sharp pain Aggravating factors: up too long, first moving if knee has gotten stiffen Relieving factors: Oxy, muscle relax & ice  PRECAUTIONS: Fall  WEIGHT BEARING RESTRICTIONS: No  FALLS:  Has patient fallen in last 6 months? No  LIVING ENVIRONMENT: Lives with: lives with their spouse and 2 dogs 12-14# Lives in: [address] house with master bedroom & bathroom main level Stairs: Yes: Internal: 16-18 steps; on left going up and External: 3 steps; none Has following equipment at home: Single point cane, Environmental consultant - 2 wheeled, Marine scientist  OCCUPATION: retired from Engineer, [employer] (consulting),   PLOF: Independent  PATIENT GOALS:  walk & be active throughout her day, go upstairs to sewing room, sewing machine uses RLE  Next MD visit: 08/10/2022  OBJECTIVE:  DIAGNOSTIC FINDINGS: 06/06/2022 post op X-ray showed right knee arthroplasty without immediate postoperative complication.  PATIENT SURVEYS:  07/12/2022: visit 8 FOTO 50%  06/21/2022:   FOTO intake:  29%  predicted:  57%  COGNITION: Overall cognitive status: WFL    SENSATION: WFL  EDEMA:  06/21/2022:   Circumferential:  LLE: above knee 43cm,  around knee 39.5cm, below knee 35 cm RLE: above knee 45.7cm,  around knee 40.8cm, below knee 38.9 cm  POSTURE: 06/21/2022:   rounded shoulders, forward head, flexed trunk , and weight shift left  PALPATION: 06/21/2022:   Right knee tenderness along joint line, right ITB tightness & tenderness  LOWER EXTREMITY ROM:   ROM Right eval Right 06/23/22 Right 06/28/22 Right 07/05/22 Right 07/12/22 Right 07/19/22 Right 07/24/22  Hip flexion         Hip extension         Hip abduction         Hip adduction         Hip internal rotation         Hip external rotation         Knee flexion Seated A:84* P: 93* Active 98 Seated A: 100* P: 104* Supine P: 116* Seated A: 111*  Seated P:  120* A: 114*  Knee extension Supine P: -7* Active -4 Standing A: -4* Supine P: -1* Seated A: LAQ -9* Standing A:-2* Seated A: -5* Seated LAQ A: -2*  Ankle dorsiflexion         Ankle plantarflexion         Ankle inversion         Ankle eversion          (Blank rows = not tested)  LOWER EXTREMITY MMT:  MMT Right eval  Hip flexion   Hip extension   Hip abduction   Hip adduction   Hip internal rotation   Hip external rotation   Knee flexion 3-/5  Knee extension 3-/5  Ankle dorsiflexion   Ankle plantarflexion   Ankle inversion   Ankle eversion    (Blank rows = not tested)   FUNCTIONAL TESTS:  06/21/2022:   22 inch chair transfer: requires use of BUEs on armrest, RLE extended with minimal assistance and requires RW for stabilization  GAIT: 06/21/2022:   Distance walked: 100' Assistive device utilized: Environmental consultant - 2 wheeled Level of assistance: SBA Comments: RLE decreased stance duration, abduction, right knee flexed in stance with minimal increase in flexion for swing.    TODAY'S TREATMENT                                                                          DATE: 07/24/2022: Therapeutic Exercise: Precor recumbent bike seat 6 level 2 full revolutions for knee range 8 min Leg press 112# 15 reps 2 sets; single LE 56# 10 reps 2 sets ea LE. Tandem stance on foam RLE in front & in back 30 sec with sink / chair back for safety intermittent touch. Stepping towards 3 target cones (ant-lat, lat & post-lat) with stance LE close to sink knee flexion on outward motion & extension on inward motion 3 reps ea LE with light stance side UE support. PT demo & verbal cues on technique.  Standing RLE TKE blue theraband 15 reps 3 sec hold contralateral heel raise / knee flexion for BLEs.  Seated hamstring curl blue theraband 10 reps  Manual therapy: PROM with overpressure right knee flexion   Vaso to right knee 34* medium compression 10 min with LE elevation.  07/19/2022: Therapeutic  Exercise: Precor recumbent bike seat 6 level 2 full revolutions for knee range 8 min Pt questioning using swing as PA recommended but she does have a swing. PT demo & verbal cues on using rolling desk chair holding table or door frame, feet planted on floor and rolling / pulling chair forward flexing knee. Pt return demo & verbalized understanding.   Scooting rolling desk chair forward with single leg knee flexion using BLEs 30' with verbal cues Standing TKE with green theraband 10 reps 3 sec hold contralateral heel raise / knee flexion for BLEs. Walking stick support with cues for upright posture.  Pt verbalized better understanding for HEP. Squat wide stance sink BUE support over chair with green T-band figure 8 around knees 10 reps 2 sets PT demo & verbal cues on technique. Pt added to HEP with HO. Pt verbalized understanding. Standing walking stick RUE & table top LUE upright posture tapping cones alternating LEs 10 reps Standing walking stick RUE & table top LUE upright posture stepping over 6" hurdle alternating LEs 10 reps Seated hamstring curl green T-band 10 reps 2 sets Hamstring stretch RLE long sit with strap 30 sec 2 reps Seated LAQ with strap assisted end range 10 reps     Vaso to right knee 34* medium compression 10 min with LE elevation.  07/17/2022: Therapeutic Exercise: Precor recumbent bike seat 6 level 2 full revolutions for knee range 8 min Leg press 106# 10 reps 2 sets; single LE 50# 10 reps 2 sets ea LE. Tandem stance on floor RLE in front & in back 30 sec with sink / chair back for safety. Stepping  towards 3 target cones (ant-lat, lat & post-lat) with stance LE close to sink knee flexion on outward motion & extension on inward motion 3 reps ea LE with light stance side UE support. PT demo & verbal cues on technique.  Standing TKE with green theraband 10 reps 3 sec hold 1st set just TKE, 2nd set contralateral heel raise / knee flexion for BLEs. Squat wide stance sink BUE  support over chair with green T-band figure 8 around knees 10 reps 2 sets PT demo & verbal cues on technique. Pt added to HEP with HO. Pt verbalized understanding. Seated LAQ & active knee flexion with contralateral LE opposing motion 5 sec hold for 2 min.    Manual therapy: PROM with overpressure right knee flexion   Vaso to right knee 34* medium compression 10 min with LE elevation.    HOME EXERCISE PROGRAM: Access Code: [email or web address] URL: [email or web address] Date: 07/17/2022 Prepared by: Vladimir Faster  Exercises - Ankle Alphabet in Elevation  - 2-4 x daily - 7 x weekly - 1 sets - 1 reps - supine quad set with towel roll under ankle  - 2-3 x daily - 7 x weekly - 2-3 sets - 10 reps - 5 seconds hold - Supine Heel Slide with Strap  - 2-3 x daily - 7 x weekly - 2-3 sets - 10 reps - 5 seconds hold - Supine Knee Extension Strengthening  - 2-3 x daily - 7 x weekly - 2-3 sets - 10 reps - 5 seconds hold - Supine Straight Leg Raises  - 2-3 x daily - 7 x weekly - 2-3 sets - 10 reps - 5 seconds hold - Seated Knee Flexion Extension AROM   - 2-4 x daily - 7 x weekly - 2-3 sets - 10 reps - 5 seconds hold - Seated straight leg lifts  - 2-3 x daily - 7 x weekly - 2-3 sets - 10 reps - 5 seconds hold - Seated Hamstring Stretch with Strap  - 2-4 x daily - 7 x weekly - 1 sets - 3 reps - 20-30 seconds hold - Seated Long Arc Quad  - 2-4 x daily - 7 x weekly - 2-3 sets - 10 reps - 5 seconds hold - Seated Knee Flexion AAROM  - 3 x daily - 7 x weekly - 1 sets - 10 reps - 10 seconds hold - Seated Table Hamstring Stretch  - 2 x daily - 7 x weekly - 1 sets - 3 reps - 30 seconds hold - Supine Quadriceps Stretch with Strap on Table  - 2 x daily - 7 x weekly - 1 sets - 3 reps - 30 seconds hold - standing calf stretch with forefoot on small step or brick  - 2 x daily - 7 x weekly - 1 sets - 3 reps - 30 seconds hold - Supine ITB Stretch with Strap  - 1 x daily - 7 x weekly - 1 sets - 3 reps - 30 seconds  hold - squat over chair like "dirty toilet"  - 1 x daily - 7 x weekly - 2-3 sets - 10 reps - 5 seconds hold - Seated Knee Extension Stretch with Chair  - 1-3 x daily - 7 x weekly - 1 sets - 2-3 reps - 10-20 seconds hold - standing knee extension  - 1 x daily - 7 x weekly - 2 sets - 10 reps - 5 seconds hold   ASSESSMENT: CLINICAL IMPRESSION: Patient's range both active & passive  continue to improve.  PT is focusing strength standing functionally to incorporate left hip.   Patient continues to benefit from skilled PT s/p TKA. OBJECTIVE IMPAIRMENTS: Abnormal gait, decreased activity tolerance, decreased balance, decreased endurance, decreased knowledge of condition, decreased knowledge of use of DME, decreased mobility, difficulty walking, decreased ROM, decreased strength, increased edema, increased muscle spasms, postural dysfunction, and pain.   ACTIVITY LIMITATIONS: carrying, lifting, bending, sitting, standing, squatting, sleeping, stairs, transfers, bed mobility, and locomotion level  PARTICIPATION LIMITATIONS: meal prep, cleaning, laundry, driving, and community activity  PERSONAL FACTORS: Age, Fitness, Past/current experiences, Time since onset of injury/illness/exacerbation, and 3+ comorbidities: see PMH  are also affecting patient's functional outcome.   REHAB POTENTIAL: Good  CLINICAL DECISION MAKING: Stable/uncomplicated  EVALUATION COMPLEXITY: Low   GOALS: Goals reviewed with patient? Yes  SHORT TERM GOALS: (target date for Short term goals 07/21/2022)   1.  Patient will demonstrate independent use of home exercise program to maintain progress from in clinic treatments.  Goal status: MET 07/17/2022  2. PROM right knee ext -2* and flexion 100* Goal status:  MET 07/05/2022  3. Interim FOTO >/= 40%  Goal status: MET 07/12/2022  LONG TERM GOALS: (target dates for all long term goals  09/08/2022 )   1. Patient will demonstrate/report pain at worst less than or equal to 2/10 to  facilitate minimal limitation in daily activity secondary to pain symptoms.  Goal status: Ongoing 07/19/2022   2. Patient will demonstrate independent use of home exercise program to facilitate ability to maintain/progress functional gains from skilled physical therapy services.  Goal status: Ongoing 07/19/2022   3. Patient will demonstrate FOTO outcome > or = 57 % to indicate reduced disability due to condition.  Goal status: OOngoing 07/19/2022   4.  Patient will demonstrate right knee MMT >/= 4/5 throughout to faciltiate usual transfers, stairs, squatting at [facility] for daily life.   Goal status: Ongoing 07/19/2022   5.  Patient AROM right knee ext 0* and flexion 100* Goal status: Ongoing 07/19/2022   6.  patient amb >500' & neg ramp/curb with LRAD modified independent.  Goal status: Ongoing 07/19/2022    PLAN:  PT FREQUENCY:  2x/week  PT DURATION: 12 weeks  PLANNED INTERVENTIONS: Therapeutic exercises, Therapeutic activity, Neuro Muscular re-education, Balance training, Gait training, Patient/Family education, Joint mobilization, Stair training, DME instructions, Dry Needling, Electrical stimulation, Traction, Cryotherapy, vasopneumatic deviceMoist heat, Taping, Ultrasound, Ionotophoresis 4mg /ml Dexamethasone, and aquatic therapy, Manual therapy.  All included unless contraindicated  PLAN FOR NEXT SESSION:   continue exercises standing for strength, balance & functional activities, vaso for edema.     Vladimir Faster, PT, DPT 07/24/2022, 12:35 PM

## 2022-07-26 ENCOUNTER — Encounter: Payer: Self-pay | Admitting: Physical Therapy

## 2022-07-26 ENCOUNTER — Ambulatory Visit: Payer: Medicare Other | Admitting: Physical Therapy

## 2022-07-26 DIAGNOSIS — R278 Other lack of coordination: Secondary | ICD-10-CM

## 2022-07-26 DIAGNOSIS — M25561 Pain in right knee: Secondary | ICD-10-CM | POA: Diagnosis not present

## 2022-07-26 DIAGNOSIS — R600 Localized edema: Secondary | ICD-10-CM

## 2022-07-26 DIAGNOSIS — R2681 Unsteadiness on feet: Secondary | ICD-10-CM | POA: Diagnosis not present

## 2022-07-26 DIAGNOSIS — R2689 Other abnormalities of gait and mobility: Secondary | ICD-10-CM | POA: Diagnosis not present

## 2022-07-26 DIAGNOSIS — M25661 Stiffness of right knee, not elsewhere classified: Secondary | ICD-10-CM

## 2022-07-26 DIAGNOSIS — M6281 Muscle weakness (generalized): Secondary | ICD-10-CM

## 2022-07-26 NOTE — Therapy (Signed)
OUTPATIENT PHYSICAL THERAPY LOWER EXTREMITY TREATMENT   Patient Name: [name] MRN: [id number] DOB:1945/06/11, 77 y.o., female Today's Date: 07/26/2022   END OF SESSION:  PT End of Session - 07/26/22 1016     Visit Number 12    Number of Visits 24    Date for PT Re-Evaluation 09/08/22    Authorization Type Medicare A&B and [employer] Fed emp    Progress Note Due on Visit 18    PT Start Time 1013    PT Stop Time 1108    PT Time Calculation (min) 55 min    Activity Tolerance Patient tolerated treatment well;Patient limited by pain;No increased pain    Behavior During Therapy [facility] for tasks assessed/performed                     Past Medical History:  Diagnosis Date   Anemia    Congenital musculoskeletal deformity of spine    Depression    Headache    Hypertension    Incontinence of bowel 09/2013   Lumbago    Lumbosacral spondylosis without myelopathy    Sleep apnea    Sleep disorder    uses gabapentin at bedtime    Spinal stenosis, lumbar region, without neurogenic claudication    Past Surgical History:  Procedure Laterality Date   ANKLE SURGERY Right 2015   lengthened the achilles    ANTERIOR HIP REVISION Right 10/07/2020   Procedure: RIGHT HIP REVISION OF  HIP BALL/POLY-LINER;  Surgeon: Kathryne Hitch, MD;  Location: [facility] OR;  Service: Orthopedics;  Laterality: Right;  RNFA PLEASE   AUGMENTATION MAMMAPLASTY Bilateral 2000 or 2001 unsure    Left Implant has busted and is no longer visible   BACK SURGERY  2015   fusion , Dr Noel Gerold at [facility]; has 2 metal rods in place , fusion from t2 to s1    BACK SURGERY  01/2017   Dr Sharolyn Douglas ; repair of cracked titanium rod in back lumbar    BREAST EXCISIONAL BIOPSY Left    30 yrs ago   bunionectomy  Bilateral 1980s   multiple    COLONOSCOPY     with polypectomy ; q 5 years    FOOT SURGERY  07/2011   SHOULDER SURGERY Left 2013   rotator cuff    TOTAL HIP ARTHROPLASTY Right 05/08/2017    Procedure: RIGHT TOTAL HIP ARTHROPLASTY ANTERIOR APPROACH;  Surgeon: Durene Romans, MD;  Location: [facility];  Service: Orthopedics;  Laterality: Right;  70 mins   TOTAL KNEE ARTHROPLASTY Right 06/06/2022   Procedure: RIGHT TOTAL KNEE ARTHROPLASTY;  Surgeon: Kathryne Hitch, MD;  Location: [facility] OR;  Service: Orthopedics;  Laterality: Right;   Patient Active Problem List   Diagnosis Date Noted   Status post total right knee replacement 06/06/2022   Leg length discrepancy, Right 10/07/2020   History of revision of total replacement of right hip joint 10/07/2020   Multinodular goiter 05/18/2020   Hip contracture, unspecified laterality 07/02/2018   Overweight (BMI 25.0-29.9) 05/09/2017   S/P right THA, AA 05/08/2017   Chronic bilateral low back pain without sciatica 04/05/2015   Disorder of sacroiliac joint 12/22/2014   Chronic low back pain 12/22/2014   Acquired scoliosis 11/27/2011   Lumbosacral spondylosis without myelopathy 05/22/2011    PCP: Jarrett Soho, MD  REFERRING PROVIDER: Kathryne Hitch, MD  REFERRING DIAG:  [phone] (ICD-10-CM) - Status post total right knee replacement  M17.11 (ICD-10-CM) - Unilateral primary osteoarthritis, right  knee   THERAPY DIAG:  Acute pain of right knee  Stiffness of right knee, not elsewhere classified  Muscle weakness (generalized)  Other abnormalities of gait and mobility  Localized edema  Unsteadiness on feet  Other lack of coordination  Rationale for Evaluation and Treatment: Rehabilitation  ONSET DATE: 06/06/2022 TKA  SUBJECTIVE:   SUBJECTIVE STATEMENT: Today is not a great day; having more knee pain  PERTINENT HISTORY: Right TKA 06/06/2022, OA, leg length discrepancy, right Total hip replacement anterior approach with revision 2022, hip contracture, chronic LBP with surgery, acquired scoliosis, HTN, ankle surgery lengthen Achilles,   PAIN:  NPRS scale: today 7/10 and since last PT 0/10 - 7/10 during day,  8-9/10 at night more in ankle Pain location: right knee along joint line Pain description: at rest dull ache and intermittent spasm & sharp pain Aggravating factors: up too long, first moving if knee has gotten stiffen Relieving factors: Oxy, muscle relax & ice  PRECAUTIONS: Fall  WEIGHT BEARING RESTRICTIONS: No  FALLS:  Has patient fallen in last 6 months? No  LIVING ENVIRONMENT: Lives with: lives with their spouse and 2 dogs 12-14# Lives in: [address] house with master bedroom & bathroom main level Stairs: Yes: Internal: 16-18 steps; on left going up and External: 3 steps; none Has following equipment at home: Single point cane, Environmental consultant - 2 wheeled, Marine scientist  OCCUPATION: retired from Engineer, [employer] (consulting),   PLOF: Independent  PATIENT GOALS:  walk & be active throughout her day, go upstairs to sewing room, sewing machine uses RLE   OBJECTIVE:  DIAGNOSTIC FINDINGS: 06/06/2022 post op X-ray showed right knee arthroplasty without immediate postoperative complication.  PATIENT SURVEYS:  07/12/2022: visit 8 FOTO 50% 06/21/2022:   FOTO intake:  29%  predicted:  57%  COGNITION: Overall cognitive status: WFL    SENSATION: WFL  EDEMA:  06/21/2022:   Circumferential:  LLE: above knee 43cm,  around knee 39.5cm, below knee 35 cm RLE: above knee 45.7cm,  around knee 40.8cm, below knee 38.9 cm  POSTURE: 06/21/2022:   rounded shoulders, forward head, flexed trunk , and weight shift left  PALPATION: 06/21/2022:   Right knee tenderness along joint line, right ITB tightness & tenderness  LOWER EXTREMITY ROM:   ROM Right eval Right 06/23/22 Right 06/28/22 Right 07/05/22 Right 07/12/22 Right 07/19/22 Right 07/24/22  Knee flexion Seated A:84* P: 93* Active 98 Seated A: 100* P: 104* Supine P: 116* Seated A: 111*  Seated P: 120* A: 114*  Knee extension Supine P: -7* Active -4 Standing A: -4* Supine P: -1* Seated A: LAQ -9* Standing A:-2* Seated A: -5* Seated LAQ A: -2*   (Blank  rows = not tested)  LOWER EXTREMITY MMT:  MMT Right eval  Knee flexion 3-/5  Knee extension 3-/5   (Blank rows = not tested)   FUNCTIONAL TESTS:  06/21/2022:   22 inch chair transfer: requires use of BUEs on armrest, RLE extended with minimal assistance and requires RW for stabilization  GAIT: 06/21/2022:   Distance walked: 100' Assistive device utilized: Environmental consultant - 2 wheeled Level of assistance: SBA Comments: RLE decreased stance duration, abduction, right knee flexed in stance with minimal increase in flexion for swing.    TODAY'S TREATMENT  DATE: 07/26/22 Therapeutic Exercise: Precor recumbent bike seat 6 level 3 full revolutions for knee range 8 min Leg press 112# 15 reps 2 sets; single LE 56# 10 reps 2 sets ea LE. Seated LAQ 2x15 bil; 4# Seated hamstring curl blue theraband on Rt 2x15 reps  Modalities Vaso to right knee 34* medium compression 10 min with LE elevation.   07/24/2022: Therapeutic Exercise: Precor recumbent bike seat 6 level 2 full revolutions for knee range 8 min Leg press 112# 15 reps 2 sets; single LE 56# 10 reps 2 sets ea LE. Tandem stance on foam RLE in front & in back 30 sec with sink / chair back for safety intermittent touch. Stepping towards 3 target cones (ant-lat, lat & post-lat) with stance LE close to sink knee flexion on outward motion & extension on inward motion 3 reps ea LE with light stance side UE support. PT demo & verbal cues on technique.  Standing RLE TKE blue theraband 15 reps 3 sec hold contralateral heel raise / knee flexion for BLEs.  Seated hamstring curl blue theraband 10 reps  Manual therapy: PROM with overpressure right knee flexion   Vaso to right knee 34* medium compression 10 min with LE elevation.  07/19/2022: Therapeutic Exercise: Precor recumbent bike seat 6 level 2 full revolutions for knee range 8 min Pt questioning using swing as PA recommended  but she does have a swing. PT demo & verbal cues on using rolling desk chair holding table or door frame, feet planted on floor and rolling / pulling chair forward flexing knee. Pt return demo & verbalized understanding.   Scooting rolling desk chair forward with single leg knee flexion using BLEs 30' with verbal cues Standing TKE with green theraband 10 reps 3 sec hold contralateral heel raise / knee flexion for BLEs. Walking stick support with cues for upright posture.  Pt verbalized better understanding for HEP. Squat wide stance sink BUE support over chair with green T-band figure 8 around knees 10 reps 2 sets PT demo & verbal cues on technique. Pt added to HEP with HO. Pt verbalized understanding. Standing walking stick RUE & table top LUE upright posture tapping cones alternating LEs 10 reps Standing walking stick RUE & table top LUE upright posture stepping over 6" hurdle alternating LEs 10 reps Seated hamstring curl green T-band 10 reps 2 sets Hamstring stretch RLE long sit with strap 30 sec 2 reps Seated LAQ with strap assisted end range 10 reps     Vaso to right knee 34* medium compression 10 min with LE elevation.  07/17/2022: Therapeutic Exercise: Precor recumbent bike seat 6 level 2 full revolutions for knee range 8 min Leg press 106# 10 reps 2 sets; single LE 50# 10 reps 2 sets ea LE. Tandem stance on floor RLE in front & in back 30 sec with sink / chair back for safety. Stepping towards 3 target cones (ant-lat, lat & post-lat) with stance LE close to sink knee flexion on outward motion & extension on inward motion 3 reps ea LE with light stance side UE support. PT demo & verbal cues on technique.  Standing TKE with green theraband 10 reps 3 sec hold 1st set just TKE, 2nd set contralateral heel raise / knee flexion for BLEs. Squat wide stance sink BUE support over chair with green T-band figure 8 around knees 10 reps 2 sets PT demo & verbal cues on technique. Pt added to HEP with HO.  Pt verbalized understanding. Seated LAQ & active  knee flexion with contralateral LE opposing motion 5 sec hold for 2 min.    Manual therapy: PROM with overpressure right knee flexion   Vaso to right knee 34* medium compression 10 min with LE elevation.    HOME EXERCISE PROGRAM: Access Code: [email or web address] URL: [email or web address] Date: 07/17/2022 Prepared by: Vladimir Faster  Exercises - Ankle Alphabet in Elevation  - 2-4 x daily - 7 x weekly - 1 sets - 1 reps - supine quad set with towel roll under ankle  - 2-3 x daily - 7 x weekly - 2-3 sets - 10 reps - 5 seconds hold - Supine Heel Slide with Strap  - 2-3 x daily - 7 x weekly - 2-3 sets - 10 reps - 5 seconds hold - Supine Knee Extension Strengthening  - 2-3 x daily - 7 x weekly - 2-3 sets - 10 reps - 5 seconds hold - Supine Straight Leg Raises  - 2-3 x daily - 7 x weekly - 2-3 sets - 10 reps - 5 seconds hold - Seated Knee Flexion Extension AROM   - 2-4 x daily - 7 x weekly - 2-3 sets - 10 reps - 5 seconds hold - Seated straight leg lifts  - 2-3 x daily - 7 x weekly - 2-3 sets - 10 reps - 5 seconds hold - Seated Hamstring Stretch with Strap  - 2-4 x daily - 7 x weekly - 1 sets - 3 reps - 20-30 seconds hold - Seated Long Arc Quad  - 2-4 x daily - 7 x weekly - 2-3 sets - 10 reps - 5 seconds hold - Seated Knee Flexion AAROM  - 3 x daily - 7 x weekly - 1 sets - 10 reps - 10 seconds hold - Seated Table Hamstring Stretch  - 2 x daily - 7 x weekly - 1 sets - 3 reps - 30 seconds hold - Supine Quadriceps Stretch with Strap on Table  - 2 x daily - 7 x weekly - 1 sets - 3 reps - 30 seconds hold - standing calf stretch with forefoot on small step or brick  - 2 x daily - 7 x weekly - 1 sets - 3 reps - 30 seconds hold - Supine ITB Stretch with Strap  - 1 x daily - 7 x weekly - 1 sets - 3 reps - 30 seconds hold - squat over chair like "dirty toilet"  - 1 x daily - 7 x weekly - 2-3 sets - 10 reps - 5 seconds hold - Seated Knee Extension  Stretch with Chair  - 1-3 x daily - 7 x weekly - 1 sets - 2-3 reps - 10-20 seconds hold - standing knee extension  - 1 x daily - 7 x weekly - 2 sets - 10 reps - 5 seconds hold   ASSESSMENT: CLINICAL IMPRESSION: Pt tolerated session well today with reduction in pain reported after session.  Deferred standing activities today due to elevated pain and will revisit as pain improves.  Will continue to benefit from PT to maximize function. OBJECTIVE IMPAIRMENTS: Abnormal gait, decreased activity tolerance, decreased balance, decreased endurance, decreased knowledge of condition, decreased knowledge of use of DME, decreased mobility, difficulty walking, decreased ROM, decreased strength, increased edema, increased muscle spasms, postural dysfunction, and pain.   ACTIVITY LIMITATIONS: carrying, lifting, bending, sitting, standing, squatting, sleeping, stairs, transfers, bed mobility, and locomotion level  PARTICIPATION LIMITATIONS: meal prep, cleaning, laundry, driving, and community activity  PERSONAL FACTORS: Age, Fitness, Past/current experiences, Time  since onset of injury/illness/exacerbation, and 3+ comorbidities: see PMH  are also affecting patient's functional outcome.   REHAB POTENTIAL: Good  CLINICAL DECISION MAKING: Stable/uncomplicated  EVALUATION COMPLEXITY: Low   GOALS: Goals reviewed with patient? Yes  SHORT TERM GOALS: (target date for Short term goals 07/21/2022)   1.  Patient will demonstrate independent use of home exercise program to maintain progress from in clinic treatments. Goal status: MET 07/17/2022  2. PROM right knee ext -2* and flexion 100* Goal status:  MET 07/05/2022  3. Interim FOTO >/= 40%  Goal status: MET 07/12/2022  LONG TERM GOALS: (target dates for all long term goals  09/08/2022 )   1. Patient will demonstrate/report pain at worst less than or equal to 2/10 to facilitate minimal limitation in daily activity secondary to pain symptoms.  Goal status:  Ongoing 07/19/2022   2. Patient will demonstrate independent use of home exercise program to facilitate ability to maintain/progress functional gains from skilled physical therapy services.  Goal status: Ongoing 07/19/2022   3. Patient will demonstrate FOTO outcome > or = 57 % to indicate reduced disability due to condition.  Goal status: OOngoing 07/19/2022   4.  Patient will demonstrate right knee MMT >/= 4/5 throughout to faciltiate usual transfers, stairs, squatting at [facility] for daily life.   Goal status: Ongoing 07/19/2022   5.  Patient AROM right knee ext 0* and flexion 100* Goal status: Ongoing 07/19/2022   6.  patient amb >500' & neg ramp/curb with LRAD modified independent.  Goal status: Ongoing 07/19/2022    PLAN:  PT FREQUENCY:  2x/week  PT DURATION: 12 weeks  PLANNED INTERVENTIONS: Therapeutic exercises, Therapeutic activity, Neuro Muscular re-education, Balance training, Gait training, Patient/Family education, Joint mobilization, Stair training, DME instructions, Dry Needling, Electrical stimulation, Traction, Cryotherapy, vasopneumatic deviceMoist heat, Taping, Ultrasound, Ionotophoresis 4mg /ml Dexamethasone, and aquatic therapy, Manual therapy.  All included unless contraindicated  PLAN FOR NEXT SESSION:  revisit exercises for standing for strength, balance & functional activities, vaso for edema.   Next MD visit: 08/10/2022  Moshe Cipro, PT, DPT 07/26/2022, 11:15 AM

## 2022-07-31 ENCOUNTER — Ambulatory Visit (INDEPENDENT_AMBULATORY_CARE_PROVIDER_SITE_OTHER): Payer: Medicare Other | Admitting: Physical Therapy

## 2022-07-31 ENCOUNTER — Encounter: Payer: Self-pay | Admitting: Physical Therapy

## 2022-07-31 DIAGNOSIS — M25561 Pain in right knee: Secondary | ICD-10-CM

## 2022-07-31 DIAGNOSIS — M25661 Stiffness of right knee, not elsewhere classified: Secondary | ICD-10-CM

## 2022-07-31 DIAGNOSIS — R2681 Unsteadiness on feet: Secondary | ICD-10-CM

## 2022-07-31 DIAGNOSIS — M6281 Muscle weakness (generalized): Secondary | ICD-10-CM

## 2022-07-31 DIAGNOSIS — R2689 Other abnormalities of gait and mobility: Secondary | ICD-10-CM

## 2022-07-31 DIAGNOSIS — R600 Localized edema: Secondary | ICD-10-CM | POA: Diagnosis not present

## 2022-07-31 NOTE — Therapy (Signed)
OUTPATIENT PHYSICAL THERAPY LOWER EXTREMITY TREATMENT   Patient Name: [name] MRN: [id number] DOB:July 19, 1945, 77 y.o., female Today's Date: 07/31/2022   END OF SESSION:  PT End of Session - 07/31/22 1302     Visit Number 13    Number of Visits 24    Date for PT Re-Evaluation 09/08/22    Authorization Type Medicare A&B and [employer] Fed emp    Progress Note Due on Visit 18    PT Start Time 1301    PT Stop Time 1344    PT Time Calculation (min) 43 min    Activity Tolerance Patient tolerated treatment well;Patient limited by pain;No increased pain    Behavior During Therapy [facility] for tasks assessed/performed                      Past Medical History:  Diagnosis Date   Anemia    Congenital musculoskeletal deformity of spine    Depression    Headache    Hypertension    Incontinence of bowel 09/2013   Lumbago    Lumbosacral spondylosis without myelopathy    Sleep apnea    Sleep disorder    uses gabapentin at bedtime    Spinal stenosis, lumbar region, without neurogenic claudication    Past Surgical History:  Procedure Laterality Date   ANKLE SURGERY Right 2015   lengthened the achilles    ANTERIOR HIP REVISION Right 10/07/2020   Procedure: RIGHT HIP REVISION OF  HIP BALL/POLY-LINER;  Surgeon: Kathryne Hitch, MD;  Location: [facility] OR;  Service: Orthopedics;  Laterality: Right;  RNFA PLEASE   AUGMENTATION MAMMAPLASTY Bilateral 2000 or 2001 unsure    Left Implant has busted and is no longer visible   BACK SURGERY  2015   fusion , Dr Noel Gerold at [facility]; has 2 metal rods in place , fusion from t2 to s1    BACK SURGERY  01/2017   Dr Sharolyn Douglas ; repair of cracked titanium rod in back lumbar    BREAST EXCISIONAL BIOPSY Left    30 yrs ago   bunionectomy  Bilateral 1980s   multiple    COLONOSCOPY     with polypectomy ; q 5 years    FOOT SURGERY  07/2011   SHOULDER SURGERY Left 2013   rotator cuff    TOTAL HIP ARTHROPLASTY Right 05/08/2017    Procedure: RIGHT TOTAL HIP ARTHROPLASTY ANTERIOR APPROACH;  Surgeon: Durene Romans, MD;  Location: [facility];  Service: Orthopedics;  Laterality: Right;  70 mins   TOTAL KNEE ARTHROPLASTY Right 06/06/2022   Procedure: RIGHT TOTAL KNEE ARTHROPLASTY;  Surgeon: Kathryne Hitch, MD;  Location: [facility] OR;  Service: Orthopedics;  Laterality: Right;   Patient Active Problem List   Diagnosis Date Noted   Status post total right knee replacement 06/06/2022   Leg length discrepancy, Right 10/07/2020   History of revision of total replacement of right hip joint 10/07/2020   Multinodular goiter 05/18/2020   Hip contracture, unspecified laterality 07/02/2018   Overweight (BMI 25.0-29.9) 05/09/2017   S/P right THA, AA 05/08/2017   Chronic bilateral low back pain without sciatica 04/05/2015   Disorder of sacroiliac joint 12/22/2014   Chronic low back pain 12/22/2014   Acquired scoliosis 11/27/2011   Lumbosacral spondylosis without myelopathy 05/22/2011    PCP: Jarrett Soho, MD  REFERRING PROVIDER: Kathryne Hitch, MD  REFERRING DIAG:  [phone] (ICD-10-CM) - Status post total right knee replacement  M17.11 (ICD-10-CM) - Unilateral primary osteoarthritis,  right knee   THERAPY DIAG:  Acute pain of right knee  Stiffness of right knee, not elsewhere classified  Other abnormalities of gait and mobility  Muscle weakness (generalized)  Localized edema  Unsteadiness on feet  Rationale for Evaluation and Treatment: Rehabilitation  ONSET DATE: 06/06/2022 TKA  SUBJECTIVE:   SUBJECTIVE STATEMENT: She is taking Ibuprofen for pain.  The exercises are going but slow.    PERTINENT HISTORY: Right TKA 06/06/2022, OA, leg length discrepancy, right Total hip replacement anterior approach with revision 2022, hip contracture, chronic LBP with surgery, acquired scoliosis, HTN, ankle surgery lengthen Achilles,   PAIN:  NPRS scale: today  4-5/10 and since last PT 0/10 - 5-6/10 during day, 8/10  at night Pain location: right knee along joint line Pain description: at rest dull ache and intermittent spasm & sharp pain Aggravating factors: up too long, first moving if knee has gotten stiffen Relieving factors: Oxy, muscle relax & ice  PRECAUTIONS: Fall  WEIGHT BEARING RESTRICTIONS: No  FALLS:  Has patient fallen in last 6 months? No  LIVING ENVIRONMENT: Lives with: lives with their spouse and 2 dogs 12-14# Lives in: [address] house with master bedroom & bathroom main level Stairs: Yes: Internal: 16-18 steps; on left going up and External: 3 steps; none Has following equipment at home: Single point cane, Environmental consultant - 2 wheeled, Marine scientist  OCCUPATION: retired from Engineer, [employer] (consulting),   PLOF: Independent  PATIENT GOALS:  walk & be active throughout her day, go upstairs to sewing room, sewing machine uses RLE   OBJECTIVE:  DIAGNOSTIC FINDINGS: 06/06/2022 post op X-ray showed right knee arthroplasty without immediate postoperative complication.  PATIENT SURVEYS:  07/12/2022: visit 8 FOTO 50% 06/21/2022:   FOTO intake:  29%  predicted:  57%  COGNITION: Overall cognitive status: WFL    SENSATION: WFL  EDEMA:  06/21/2022:   Circumferential:  LLE: above knee 43cm,  around knee 39.5cm, below knee 35 cm RLE: above knee 45.7cm,  around knee 40.8cm, below knee 38.9 cm  POSTURE: 06/21/2022:   rounded shoulders, forward head, flexed trunk , and weight shift left  PALPATION: 06/21/2022:   Right knee tenderness along joint line, right ITB tightness & tenderness  LOWER EXTREMITY ROM:   ROM Right eval Right 06/23/22 Right 06/28/22 Right 07/05/22 Right 07/12/22 Right 07/19/22 Right 07/24/22  Knee flexion Seated A:84* P: 93* Active 98 Seated A: 100* P: 104* Supine P: 116* Seated A: 111*  Seated P: 120* A: 114*  Knee extension Supine P: -7* Active -4 Standing A: -4* Supine P: -1* Seated A: LAQ -9* Standing A:-2* Seated A: -5* Seated LAQ A: -2*   (Blank rows = not  tested)  LOWER EXTREMITY MMT:  MMT Right eval  Knee flexion 3-/5  Knee extension 3-/5   (Blank rows = not tested)   FUNCTIONAL TESTS:  06/21/2022:   22 inch chair transfer: requires use of BUEs on armrest, RLE extended with minimal assistance and requires RW for stabilization  GAIT: 06/21/2022:   Distance walked: 100' Assistive device utilized: Environmental consultant - 2 wheeled Level of assistance: SBA Comments: RLE decreased stance duration, abduction, right knee flexed in stance with minimal increase in flexion for swing.    TODAY'S TREATMENT  DATE: 07/31/2022: Therapeutic Exercise: Precor recumbent bike seat 6 level 3 full revolutions for knee range 8 min Step up & over BOSU round side up with BUE support, 2 step approach 10 reps with BLEs. PT demo & verbal cues on technique.  Lateral step up BOSU round side up with BUE support, reaching contralateral LE across in front & in back 10 reps with ea LE.  Seated hamstring curl blue theraband on BLEs 2x15 reps  Neuromuscular Re-education: Standing on towel roll to facilitate hip strategy in corner: eyes open head motion right/left, up/down & diagonal 5 reps ea.  Static eyes closed 10 sec 3 reps   07/26/22 Therapeutic Exercise: Precor recumbent bike seat 6 level 3 full revolutions for knee range 8 min Leg press 112# 15 reps 2 sets; single LE 56# 10 reps 2 sets ea LE. Seated LAQ 2x15 bil; 4# Seated hamstring curl blue theraband on Rt 2x15 reps  Modalities Vaso to right knee 34* medium compression 10 min with LE elevation.   07/24/2022: Therapeutic Exercise: Precor recumbent bike seat 6 level 2 full revolutions for knee range 8 min Leg press 112# 15 reps 2 sets; single LE 56# 10 reps 2 sets ea LE. Tandem stance on foam RLE in front & in back 30 sec with sink / chair back for safety intermittent touch. Stepping towards 3 target cones (ant-lat, lat & post-lat) with stance LE  close to sink knee flexion on outward motion & extension on inward motion 3 reps ea LE with light stance side UE support. PT demo & verbal cues on technique.  Standing RLE TKE blue theraband 15 reps 3 sec hold contralateral heel raise / knee flexion for BLEs.  Seated hamstring curl blue theraband 10 reps  Manual therapy: PROM with overpressure right knee flexion   Vaso to right knee 34* medium compression 10 min with LE elevation.   HOME EXERCISE PROGRAM: Access Code: [email or web address] URL: [email or web address] Date: 07/17/2022 Prepared by: Vladimir Faster  Exercises - Ankle Alphabet in Elevation  - 2-4 x daily - 7 x weekly - 1 sets - 1 reps - supine quad set with towel roll under ankle  - 2-3 x daily - 7 x weekly - 2-3 sets - 10 reps - 5 seconds hold - Supine Heel Slide with Strap  - 2-3 x daily - 7 x weekly - 2-3 sets - 10 reps - 5 seconds hold - Supine Knee Extension Strengthening  - 2-3 x daily - 7 x weekly - 2-3 sets - 10 reps - 5 seconds hold - Supine Straight Leg Raises  - 2-3 x daily - 7 x weekly - 2-3 sets - 10 reps - 5 seconds hold - Seated Knee Flexion Extension AROM   - 2-4 x daily - 7 x weekly - 2-3 sets - 10 reps - 5 seconds hold - Seated straight leg lifts  - 2-3 x daily - 7 x weekly - 2-3 sets - 10 reps - 5 seconds hold - Seated Hamstring Stretch with Strap  - 2-4 x daily - 7 x weekly - 1 sets - 3 reps - 20-30 seconds hold - Seated Long Arc Quad  - 2-4 x daily - 7 x weekly - 2-3 sets - 10 reps - 5 seconds hold - Seated Knee Flexion AAROM  - 3 x daily - 7 x weekly - 1 sets - 10 reps - 10 seconds hold - Seated Table Hamstring Stretch  - 2 x daily - 7 x weekly - 1  sets - 3 reps - 30 seconds hold - Supine Quadriceps Stretch with Strap on Table  - 2 x daily - 7 x weekly - 1 sets - 3 reps - 30 seconds hold - standing calf stretch with forefoot on small step or brick  - 2 x daily - 7 x weekly - 1 sets - 3 reps - 30 seconds hold - Supine ITB Stretch with Strap  - 1 x daily -  7 x weekly - 1 sets - 3 reps - 30 seconds hold - squat over chair like "dirty toilet"  - 1 x daily - 7 x weekly - 2-3 sets - 10 reps - 5 seconds hold - Seated Knee Extension Stretch with Chair  - 1-3 x daily - 7 x weekly - 1 sets - 2-3 reps - 10-20 seconds hold - standing knee extension  - 1 x daily - 7 x weekly - 2 sets - 10 reps - 5 seconds hold   ASSESSMENT: CLINICAL IMPRESSION: Pt improved knee control with step up & step down activities with instruction & repetition. Pt tolerated standing activities.  OBJECTIVE IMPAIRMENTS: Abnormal gait, decreased activity tolerance, decreased balance, decreased endurance, decreased knowledge of condition, decreased knowledge of use of DME, decreased mobility, difficulty walking, decreased ROM, decreased strength, increased edema, increased muscle spasms, postural dysfunction, and pain.   ACTIVITY LIMITATIONS: carrying, lifting, bending, sitting, standing, squatting, sleeping, stairs, transfers, bed mobility, and locomotion level  PARTICIPATION LIMITATIONS: meal prep, cleaning, laundry, driving, and community activity  PERSONAL FACTORS: Age, Fitness, Past/current experiences, Time since onset of injury/illness/exacerbation, and 3+ comorbidities: see PMH  are also affecting patient's functional outcome.   REHAB POTENTIAL: Good  CLINICAL DECISION MAKING: Stable/uncomplicated  EVALUATION COMPLEXITY: Low   GOALS: Goals reviewed with patient? Yes  SHORT TERM GOALS: (target date for Short term goals 07/21/2022)   1.  Patient will demonstrate independent use of home exercise program to maintain progress from in clinic treatments. Goal status: MET 07/17/2022  2. PROM right knee ext -2* and flexion 100* Goal status:  MET 07/05/2022  3. Interim FOTO >/= 40%  Goal status: MET 07/12/2022  LONG TERM GOALS: (target dates for all long term goals  09/08/2022 )   1. Patient will demonstrate/report pain at worst less than or equal to 2/10 to facilitate minimal  limitation in daily activity secondary to pain symptoms.  Goal status: Ongoing 07/19/2022   2. Patient will demonstrate independent use of home exercise program to facilitate ability to maintain/progress functional gains from skilled physical therapy services.  Goal status: Ongoing 07/19/2022   3. Patient will demonstrate FOTO outcome > or = 57 % to indicate reduced disability due to condition.  Goal status: OOngoing 07/19/2022   4.  Patient will demonstrate right knee MMT >/= 4/5 throughout to faciltiate usual transfers, stairs, squatting at [facility] for daily life.   Goal status: Ongoing 07/19/2022   5.  Patient AROM right knee ext 0* and flexion 100* Goal status: Ongoing 07/19/2022   6.  patient amb >500' & neg ramp/curb with LRAD modified independent.  Goal status: Ongoing 07/19/2022    PLAN:  PT FREQUENCY:  2x/week  PT DURATION: 12 weeks  PLANNED INTERVENTIONS: Therapeutic exercises, Therapeutic activity, Neuro Muscular re-education, Balance training, Gait training, Patient/Family education, Joint mobilization, Stair training, DME instructions, Dry Needling, Electrical stimulation, Traction, Cryotherapy, vasopneumatic deviceMoist heat, Taping, Ultrasound, Ionotophoresis 4mg /ml Dexamethasone, and aquatic therapy, Manual therapy.  All included unless contraindicated  PLAN FOR NEXT SESSION:  continue with exercises  for standing for strength, balance & functional activities, vaso for edema prn.   Next MD visit: 08/10/2022  Vladimir Faster, PT, DPT 07/31/2022, 1:57 PM

## 2022-08-02 ENCOUNTER — Ambulatory Visit (INDEPENDENT_AMBULATORY_CARE_PROVIDER_SITE_OTHER): Payer: Medicare Other | Admitting: Physical Therapy

## 2022-08-02 ENCOUNTER — Encounter: Payer: Self-pay | Admitting: Physical Therapy

## 2022-08-02 DIAGNOSIS — M6281 Muscle weakness (generalized): Secondary | ICD-10-CM

## 2022-08-02 DIAGNOSIS — M25661 Stiffness of right knee, not elsewhere classified: Secondary | ICD-10-CM | POA: Diagnosis not present

## 2022-08-02 DIAGNOSIS — R600 Localized edema: Secondary | ICD-10-CM

## 2022-08-02 DIAGNOSIS — M25561 Pain in right knee: Secondary | ICD-10-CM

## 2022-08-02 DIAGNOSIS — R2689 Other abnormalities of gait and mobility: Secondary | ICD-10-CM | POA: Diagnosis not present

## 2022-08-02 DIAGNOSIS — R2681 Unsteadiness on feet: Secondary | ICD-10-CM | POA: Diagnosis not present

## 2022-08-02 NOTE — Therapy (Signed)
OUTPATIENT PHYSICAL THERAPY LOWER EXTREMITY TREATMENT   Patient Name: [name] MRN: [id number] DOB:Jul 28, 1945, 77 y.o., female Today's Date: 08/02/2022   END OF SESSION:  PT End of Session - 08/02/22 1307     Visit Number 14    Number of Visits 24    Date for PT Re-Evaluation 09/08/22    Authorization Type Medicare A&B and [employer] Fed emp    Progress Note Due on Visit 18    PT Start Time 1305    PT Stop Time 1355    PT Time Calculation (min) 50 min    Activity Tolerance Patient tolerated treatment well;Patient limited by pain;No increased pain    Behavior During Therapy [facility] for tasks assessed/performed                       Past Medical History:  Diagnosis Date   Anemia    Congenital musculoskeletal deformity of spine    Depression    Headache    Hypertension    Incontinence of bowel 09/2013   Lumbago    Lumbosacral spondylosis without myelopathy    Sleep apnea    Sleep disorder    uses gabapentin at bedtime    Spinal stenosis, lumbar region, without neurogenic claudication    Past Surgical History:  Procedure Laterality Date   ANKLE SURGERY Right 2015   lengthened the achilles    ANTERIOR HIP REVISION Right 10/07/2020   Procedure: RIGHT HIP REVISION OF  HIP BALL/POLY-LINER;  Surgeon: Kathryne Hitch, MD;  Location: [facility] OR;  Service: Orthopedics;  Laterality: Right;  RNFA PLEASE   AUGMENTATION MAMMAPLASTY Bilateral 2000 or 2001 unsure    Left Implant has busted and is no longer visible   BACK SURGERY  2015   fusion , Dr Noel Gerold at [facility]; has 2 metal rods in place , fusion from t2 to s1    BACK SURGERY  01/2017   Dr Sharolyn Douglas ; repair of cracked titanium rod in back lumbar    BREAST EXCISIONAL BIOPSY Left    30 yrs ago   bunionectomy  Bilateral 1980s   multiple    COLONOSCOPY     with polypectomy ; q 5 years    FOOT SURGERY  07/2011   SHOULDER SURGERY Left 2013   rotator cuff    TOTAL HIP ARTHROPLASTY Right 05/08/2017    Procedure: RIGHT TOTAL HIP ARTHROPLASTY ANTERIOR APPROACH;  Surgeon: Durene Romans, MD;  Location: [facility] OR[facility];  Service: Orthopedics;  Laterality: Right;  70 mins   TOTAL KNEE ARTHROPLASTY Right 06/06/2022   Procedure: RIGHT TOTAL KNEE ARTHROPLASTY;  Surgeon: Kathryne Hitch, MD;  Location: [facility] OR;  Service: Orthopedics;  Laterality: Right;   Patient Active Problem List   Diagnosis Date Noted   Status post total right knee replacement 06/06/2022   Leg length discrepancy, Right 10/07/2020   History of revision of total replacement of right hip joint 10/07/2020   Multinodular goiter 05/18/2020   Hip contracture, unspecified laterality 07/02/2018   Overweight (BMI 25.0-29.9) 05/09/2017   S/P right THA, AA 05/08/2017   Chronic bilateral low back pain without sciatica 04/05/2015   Disorder of sacroiliac joint 12/22/2014   Chronic low back pain 12/22/2014   Acquired scoliosis 11/27/2011   Lumbosacral spondylosis without myelopathy 05/22/2011    PCP: Jarrett Soho, MD  REFERRING PROVIDER: Kathryne Hitch, MD  REFERRING DIAG:  [id number] (ICD-10-CM) - Status post total right knee replacement  M17.11 (ICD-10-CM) - Unilateral primary  osteoarthritis, right knee   THERAPY DIAG:  Acute pain of right knee  Stiffness of right knee, not elsewhere classified  Other abnormalities of gait and mobility  Muscle weakness (generalized)  Localized edema  Unsteadiness on feet  Rationale for Evaluation and Treatment: Rehabilitation  ONSET DATE: 06/06/2022 TKA  SUBJECTIVE:   SUBJECTIVE STATEMENT: She continues to do her exercises.    PERTINENT HISTORY: Right TKA 06/06/2022, OA, leg length discrepancy, right Total hip replacement anterior approach with revision 2022, hip contracture, chronic LBP with surgery, acquired scoliosis, HTN, ankle surgery lengthen Achilles,   PAIN:  NPRS scale: today 4-5/10 and since last PT 0/10 - 5-6/10 during day, 8/10 at night Pain location: right knee  along joint line Pain description: at rest dull ache and intermittent spasm & sharp pain Aggravating factors: up too long, first moving if knee has gotten stiffen Relieving factors: Oxy, muscle relax & ice  PRECAUTIONS: Fall  WEIGHT BEARING RESTRICTIONS: No  FALLS:  Has patient fallen in last 6 months? No  LIVING ENVIRONMENT: Lives with: lives with their spouse and 2 dogs 12-14# Lives in: [address] house with master bedroom & bathroom main level Stairs: Yes: Internal: 16-18 steps; on left going up and External: 3 steps; none Has following equipment at home: Single point cane, Environmental consultant - 2 wheeled, Marine scientist  OCCUPATION: retired from Engineer, [employer] (consulting),   PLOF: Independent  PATIENT GOALS:  walk & be active throughout her day, go upstairs to sewing room, sewing machine uses RLE   OBJECTIVE:  DIAGNOSTIC FINDINGS: 06/06/2022 post op X-ray showed right knee arthroplasty without immediate postoperative complication.  PATIENT SURVEYS:  07/12/2022: visit 8 FOTO 50% 06/21/2022:   FOTO intake:  29%  predicted:  57%  COGNITION: Overall cognitive status: WFL    SENSATION: WFL  EDEMA:  06/21/2022:   Circumferential:  LLE: above knee 43cm,  around knee 39.5cm, below knee 35 cm RLE: above knee 45.7cm,  around knee 40.8cm, below knee 38.9 cm  POSTURE: 06/21/2022:   rounded shoulders, forward head, flexed trunk , and weight shift left  PALPATION: 06/21/2022:   Right knee tenderness along joint line, right ITB tightness & tenderness  LOWER EXTREMITY ROM:   ROM Right eval Right 06/23/22 Right 06/28/22 Right 07/05/22 Right 07/12/22 Right 07/19/22 Right 07/24/22 Right 08/02/22  Knee flexion Seated A:84* P: 93* Active 98 Seated A: 100* P: 104* Supine P: 116* Seated A: 111*  Seated P: 120* A: 114*   Knee extension Supine P: -7* Active -4 Standing A: -4* Supine P: -1* Seated A: LAQ -9* Standing A:-2* Seated A: -5* Seated LAQ A: -2* Seated LAQ A: -1*   (Blank rows = not  tested)  LOWER EXTREMITY MMT:  MMT Right eval  Knee flexion 3-/5  Knee extension 3-/5   (Blank rows = not tested)   FUNCTIONAL TESTS:  06/21/2022:   22 inch chair transfer: requires use of BUEs on armrest, RLE extended with minimal assistance and requires RW for stabilization  GAIT: 06/21/2022:   Distance walked: 100' Assistive device utilized: Environmental consultant - 2 wheeled Level of assistance: SBA Comments: RLE decreased stance duration, abduction, right knee flexed in stance with minimal increase in flexion for swing.    TODAY'S TREATMENT  DATE: 08/02/2022: Therapeutic Exercise: PT verbally educated on need for ongoing fitness plan to include flexibility, strength, endurance & balance.  PT recommending strarting back into gym 1x/wk for understanding exercises and develop routine following discharge. Start with bike 10 min 2 sets with 5 min rest.  Pt verbalized understanding.  Precor recumbent bike seat 6 level 3 full revolutions for knee range 8 min Step up & over BOSU round side up with BUE support, 2 step approach 10 reps with BLEs. PT demo & verbal cues on technique.  Lateral step up BOSU round side up with BUE support, reaching contralateral LE across in front & in back 10 reps with ea LE.  Seated hamstring curl blue theraband on BLEs  1x15 reps Seated LAQ 5# 10 reps 2 sets BLEs  Neuromuscular Re-education: Standing crossways on foam beam to facilitate hip strategy in corner: eyes open head motion right/left, up/down & diagonal 10 reps ea.  Static eyes closed 10 sec 3 reps   Modalities Vaso to right knee 34* medium compression 10 min with LE elevation.    07/31/2022: Therapeutic Exercise: Precor recumbent bike seat 6 level 3 full revolutions for knee range 8 min Step up & over BOSU round side up with BUE support, 2 step approach 10 reps with BLEs. PT demo & verbal cues on technique.  Lateral step up BOSU round  side up with BUE support, reaching contralateral LE across in front & in back 10 reps with ea LE.  Seated hamstring curl blue theraband on BLEs 2x15 reps  Neuromuscular Re-education: Standing on towel roll to facilitate hip strategy in corner: eyes open head motion right/left, up/down & diagonal 5 reps ea.  Static eyes closed 10 sec 3 reps   07/26/22 Therapeutic Exercise: Precor recumbent bike seat 6 level 3 full revolutions for knee range 8 min Leg press 112# 15 reps 2 sets; single LE 56# 10 reps 2 sets ea LE. Seated LAQ 2x15 bil; 4# Seated hamstring curl blue theraband on Rt 2x15 reps  Modalities Vaso to right knee 34* medium compression 10 min with LE elevation.    HOME EXERCISE PROGRAM: Access Code: [email or web address] URL: [email or web address] Date: 07/17/2022 Prepared by: Vladimir Faster  Exercises - Ankle Alphabet in Elevation  - 2-4 x daily - 7 x weekly - 1 sets - 1 reps - supine quad set with towel roll under ankle  - 2-3 x daily - 7 x weekly - 2-3 sets - 10 reps - 5 seconds hold - Supine Heel Slide with Strap  - 2-3 x daily - 7 x weekly - 2-3 sets - 10 reps - 5 seconds hold - Supine Knee Extension Strengthening  - 2-3 x daily - 7 x weekly - 2-3 sets - 10 reps - 5 seconds hold - Supine Straight Leg Raises  - 2-3 x daily - 7 x weekly - 2-3 sets - 10 reps - 5 seconds hold - Seated Knee Flexion Extension AROM   - 2-4 x daily - 7 x weekly - 2-3 sets - 10 reps - 5 seconds hold - Seated straight leg lifts  - 2-3 x daily - 7 x weekly - 2-3 sets - 10 reps - 5 seconds hold - Seated Hamstring Stretch with Strap  - 2-4 x daily - 7 x weekly - 1 sets - 3 reps - 20-30 seconds hold - Seated Long Arc Quad  - 2-4 x daily - 7 x weekly - 2-3 sets - 10 reps - 5 seconds hold - Seated  Knee Flexion AAROM  - 3 x daily - 7 x weekly - 1 sets - 10 reps - 10 seconds hold - Seated Table Hamstring Stretch  - 2 x daily - 7 x weekly - 1 sets - 3 reps - 30 seconds hold - Supine Quadriceps Stretch with  Strap on Table  - 2 x daily - 7 x weekly - 1 sets - 3 reps - 30 seconds hold - standing calf stretch with forefoot on small step or brick  - 2 x daily - 7 x weekly - 1 sets - 3 reps - 30 seconds hold - Supine ITB Stretch with Strap  - 1 x daily - 7 x weekly - 1 sets - 3 reps - 30 seconds hold - squat over chair like "dirty toilet"  - 1 x daily - 7 x weekly - 2-3 sets - 10 reps - 5 seconds hold - Seated Knee Extension Stretch with Chair  - 1-3 x daily - 7 x weekly - 1 sets - 2-3 reps - 10-20 seconds hold - standing knee extension  - 1 x daily - 7 x weekly - 2 sets - 10 reps - 5 seconds hold   ASSESSMENT: CLINICAL IMPRESSION: Patient is slowly improving functional strength with PT exercises & activities.  PT began discussion for ongoing HEP / fitness plan including initiating components while still under care & guidance of PT.  Pt would benefit from session in pool with skilled PT to instruct in effective exercises.  Pt tolerated standing activities.  OBJECTIVE IMPAIRMENTS: Abnormal gait, decreased activity tolerance, decreased balance, decreased endurance, decreased knowledge of condition, decreased knowledge of use of DME, decreased mobility, difficulty walking, decreased ROM, decreased strength, increased edema, increased muscle spasms, postural dysfunction, and pain.   ACTIVITY LIMITATIONS: carrying, lifting, bending, sitting, standing, squatting, sleeping, stairs, transfers, bed mobility, and locomotion level  PARTICIPATION LIMITATIONS: meal prep, cleaning, laundry, driving, and community activity  PERSONAL FACTORS: Age, Fitness, Past/current experiences, Time since onset of injury/illness/exacerbation, and 3+ comorbidities: see PMH  are also affecting patient's functional outcome.   REHAB POTENTIAL: Good  CLINICAL DECISION MAKING: Stable/uncomplicated  EVALUATION COMPLEXITY: Low   GOALS: Goals reviewed with patient? Yes  SHORT TERM GOALS: (target date for Short term goals  07/21/2022)   1.  Patient will demonstrate independent use of home exercise program to maintain progress from in clinic treatments. Goal status: MET 07/17/2022  2. PROM right knee ext -2* and flexion 100* Goal status:  MET 07/05/2022  3. Interim FOTO >/= 40%  Goal status: MET 07/12/2022  LONG TERM GOALS: (target dates for all long term goals  09/08/2022 )   1. Patient will demonstrate/report pain at worst less than or equal to 2/10 to facilitate minimal limitation in daily activity secondary to pain symptoms.  Goal status: Ongoing 08/02/2022   2. Patient will demonstrate independent use of home exercise program to facilitate ability to maintain/progress functional gains from skilled physical therapy services.  Goal status: Ongoing 08/02/2022   3. Patient will demonstrate FOTO outcome > or = 57 % to indicate reduced disability due to condition.  Goal status: OOngoing 08/02/2022   4.  Patient will demonstrate right knee MMT >/= 4/5 throughout to faciltiate usual transfers, stairs, squatting at [facility] for daily life.   Goal status: Ongoing 08/02/2022   5.  Patient AROM right knee ext 0* and flexion 100* Goal status: Ongoing 08/02/2022   6.  patient amb >500' & neg ramp/curb with LRAD modified independent.  Goal  status: Ongoing 08/02/2022    PLAN:  PT FREQUENCY:  2x/week  PT DURATION: 12 weeks  PLANNED INTERVENTIONS: Therapeutic exercises, Therapeutic activity, Neuro Muscular re-education, Balance training, Gait training, Patient/Family education, Joint mobilization, Stair training, DME instructions, Dry Needling, Electrical stimulation, Traction, Cryotherapy, vasopneumatic deviceMoist heat, Taping, Ultrasound, Ionotophoresis 4mg /ml Dexamethasone, and aquatic therapy, Manual therapy.  All included unless contraindicated  PLAN FOR NEXT SESSION:  send progress note to MD prior to appt.  One aquatic PT session.    continue with exercises for standing for strength, balance & functional  activities, vaso for edema prn.   Next MD visit: 08/10/2022  Vladimir Faster, PT, DPT 08/02/2022, 3:40 PM

## 2022-08-07 ENCOUNTER — Ambulatory Visit (INDEPENDENT_AMBULATORY_CARE_PROVIDER_SITE_OTHER): Payer: Medicare Other | Admitting: Physical Therapy

## 2022-08-07 ENCOUNTER — Encounter: Payer: Self-pay | Admitting: Physical Therapy

## 2022-08-07 DIAGNOSIS — R2689 Other abnormalities of gait and mobility: Secondary | ICD-10-CM | POA: Diagnosis not present

## 2022-08-07 DIAGNOSIS — R600 Localized edema: Secondary | ICD-10-CM | POA: Diagnosis not present

## 2022-08-07 DIAGNOSIS — R2681 Unsteadiness on feet: Secondary | ICD-10-CM | POA: Diagnosis not present

## 2022-08-07 DIAGNOSIS — M6281 Muscle weakness (generalized): Secondary | ICD-10-CM | POA: Diagnosis not present

## 2022-08-07 DIAGNOSIS — M25661 Stiffness of right knee, not elsewhere classified: Secondary | ICD-10-CM | POA: Diagnosis not present

## 2022-08-07 DIAGNOSIS — M25561 Pain in right knee: Secondary | ICD-10-CM

## 2022-08-07 NOTE — Therapy (Signed)
OUTPATIENT PHYSICAL THERAPY LOWER EXTREMITY TREATMENT & PROGRESS NOTE   Patient Name: [name] MRN: [id number] DOB:[birth year], 77 y.o., female Today's Date: 08/07/2022  Progress Note Reporting Period 07/12/2022 to 08/07/2022  See note below for Objective Data and Assessment of Progress/Goals.    END OF SESSION:  PT End of Session - 08/07/22 1306     Visit Number 15    Number of Visits 24    Date for PT Re-Evaluation 09/08/22    Authorization Type Medicare A&B and [employer] Fed emp    Progress Note Due on Visit 25    PT Start Time 1304    PT Stop Time 1357    PT Time Calculation (min) 53 min    Activity Tolerance Patient tolerated treatment well;Patient limited by pain;No increased pain    Behavior During Therapy [facility] for tasks assessed/performed                        Past Medical History:  Diagnosis Date   Anemia    Congenital musculoskeletal deformity of spine    Depression    Headache    Hypertension    Incontinence of bowel 09/2013   Lumbago    Lumbosacral spondylosis without myelopathy    Sleep apnea    Sleep disorder    uses gabapentin at bedtime    Spinal stenosis, lumbar region, without neurogenic claudication    Past Surgical History:  Procedure Laterality Date   ANKLE SURGERY Right 2015   lengthened the achilles    ANTERIOR HIP REVISION Right 10/07/2020   Procedure: RIGHT HIP REVISION OF  HIP BALL/POLY-LINER;  Surgeon: Kathryne Hitch, MD;  Location: [facility] OR;  Service: Orthopedics;  Laterality: Right;  RNFA PLEASE   AUGMENTATION MAMMAPLASTY Bilateral 2000 or 2001 unsure    Left Implant has busted and is no longer visible   BACK SURGERY  2015   fusion , Dr Noel Gerold at [facility]; has 2 metal rods in place , fusion from t2 to s1    BACK SURGERY  01/2017   Dr Sharolyn Douglas ; repair of cracked titanium rod in back lumbar    BREAST EXCISIONAL BIOPSY Left    30 yrs ago   bunionectomy  Bilateral 1980s   multiple    COLONOSCOPY      with polypectomy ; q 5 years    FOOT SURGERY  07/2011   SHOULDER SURGERY Left 2013   rotator cuff    TOTAL HIP ARTHROPLASTY Right 05/08/2017   Procedure: RIGHT TOTAL HIP ARTHROPLASTY ANTERIOR APPROACH;  Surgeon: Durene Romans, MD;  Location: [facility];  Service: Orthopedics;  Laterality: Right;  70 mins   TOTAL KNEE ARTHROPLASTY Right 06/06/2022   Procedure: RIGHT TOTAL KNEE ARTHROPLASTY;  Surgeon: Kathryne Hitch, MD;  Location: [facility] OR;  Service: Orthopedics;  Laterality: Right;   Patient Active Problem List   Diagnosis Date Noted   Status post total right knee replacement 06/06/2022   Leg length discrepancy, Right 10/07/2020   History of revision of total replacement of right hip joint 10/07/2020   Multinodular goiter 05/18/2020   Hip contracture, unspecified laterality 07/02/2018   Overweight (BMI 25.0-29.9) 05/09/2017   S/P right THA, AA 05/08/2017   Chronic bilateral low back pain without sciatica 04/05/2015   Disorder of sacroiliac joint 12/22/2014   Chronic low back pain 12/22/2014   Acquired scoliosis 11/27/2011   Lumbosacral spondylosis without myelopathy 05/22/2011    PCP: Jarrett Soho, MD  REFERRING  PROVIDER: Kathryne Hitch, MD  REFERRING DIAG:  [phone] (ICD-10-CM) - Status post total right knee replacement  M17.11 (ICD-10-CM) - Unilateral primary osteoarthritis, right knee   THERAPY DIAG:  Acute pain of right knee  Stiffness of right knee, not elsewhere classified  Other abnormalities of gait and mobility  Muscle weakness (generalized)  Localized edema  Unsteadiness on feet  Rationale for Evaluation and Treatment: Rehabilitation  ONSET DATE: 06/06/2022 TKA  SUBJECTIVE:   SUBJECTIVE STATEMENT: She has been doing exercises while watching Olympics.    PERTINENT HISTORY: Right TKA 06/06/2022, OA, leg length discrepancy, right Total hip replacement anterior approach with revision 2022, hip contracture, chronic LBP with surgery, acquired  scoliosis, HTN, ankle surgery lengthen Achilles,   PAIN:  NPRS scale: today 3/10 and since last PT 0/10 - 4/10 during day, 2-3/10 at night Pain location: right knee along joint line Pain description: at rest dull ache and intermittent spasm & sharp pain Aggravating factors: up too long, first moving if knee has gotten stiffen Relieving factors: Oxy, muscle relax & ice  PRECAUTIONS: Fall  WEIGHT BEARING RESTRICTIONS: No  FALLS:  Has patient fallen in last 6 months? No  LIVING ENVIRONMENT: Lives with: lives with their spouse and 2 dogs 12-14# Lives in: [address] house with master bedroom & bathroom main level Stairs: Yes: Internal: 16-18 steps; on left going up and External: 3 steps; none Has following equipment at home: Single point cane, Environmental consultant - 2 wheeled, Marine scientist  OCCUPATION: retired from Engineer, [employer] (consulting),   PLOF: Independent  PATIENT GOALS:  walk & be active throughout her day, go upstairs to sewing room, sewing machine uses RLE   OBJECTIVE:  DIAGNOSTIC FINDINGS: 06/06/2022 post op X-ray showed right knee arthroplasty without immediate postoperative complication.  PATIENT SURVEYS:  07/12/2022: visit 8 FOTO 50% 06/21/2022:   FOTO intake:  29%  predicted:  57%  COGNITION: Overall cognitive status: WFL    SENSATION: WFL  EDEMA:  06/21/2022:   Circumferential:  LLE: above knee 43cm,  around knee 39.5cm, below knee 35 cm RLE: above knee 45.7cm,  around knee 40.8cm, below knee 38.9 cm  POSTURE: 06/21/2022:   rounded shoulders, forward head, flexed trunk , and weight shift left  PALPATION: 06/21/2022:   Right knee tenderness along joint line, right ITB tightness & tenderness  LOWER EXTREMITY ROM:   ROM Right eval Right 06/23/22 Right 06/28/22 Right 07/05/22 Right 07/12/22 Right 07/19/22 Right 07/24/22 Right 08/02/22 Right 08/07/22  Knee flexion Seated A:84* P: 93* Active 98 Seated A: 100* P: 104* Supine P: 116* Seated A: 111*  Seated P: 120* A: 114*  Seated P:  123* A: 119*  Knee extension Supine P: -7* Active -4 Standing A: -4* Supine P: -1* Seated A: LAQ -9* Standing A:-2* Seated A: -5* Seated LAQ A: -2* Seated LAQ A: -1* Seated LAQ A: 0*   (Blank rows = not tested)  LOWER EXTREMITY MMT:  MMT Right eval Left 08/07/22 Right 08/07/22  Knee flexion 3-/5    Knee extension 3-/5 HH dynameter 71# & 78.2# HH dynameter 45.7# & 44.1# RLE:LLE 60%   (Blank rows = not tested)   FUNCTIONAL TESTS:  06/21/2022:   22 inch chair transfer: requires use of BUEs on armrest, RLE extended with minimal assistance and requires RW for stabilization  GAIT: 06/21/2022:   Distance walked: 100' Assistive device utilized: Environmental consultant - 2 wheeled Level of assistance: SBA Comments: RLE decreased stance duration, abduction, right knee flexed in stance with minimal increase in  flexion for swing.    TODAY'S TREATMENT                                                                          DATE: 08/07/2022: Therapeutic Exercise: Precor recumbent bike seat 6 level 3 full revolutions for knee range 8 min Step up & over BOSU round side up with BUE support, 2 step approach 10 reps with BLEs. PT demo & verbal cues on technique.  Lateral step up BOSU round side up with BUE support, reaching contralateral LE across in front & in back 10 reps with ea LE.  Seated hamstring curl blue theraband on BLEs  2 x10 reps Seated LAQ 5# 10 reps 2 sets BLEs  Neuromuscular Re-education: Rocker board with light BUE support //bars: 1 min ea ant/post & med/lat  1st set with square board double pivot and 2nd set round board single pivot (requires control of other plane of motion).   Modalities Vaso to right knee 34* medium compression 10 min with LE elevation.   08/02/2022: Therapeutic Exercise: PT verbally educated on need for ongoing fitness plan to include flexibility, strength, endurance & balance.  PT recommending strarting back into gym 1x/wk for understanding exercises and develop  routine following discharge. Start with bike 10 min 2 sets with 5 min rest.  Pt verbalized understanding.  Precor recumbent bike seat 6 level 3 full revolutions for knee range 8 min Step up & over BOSU round side up with BUE support, 2 step approach 10 reps with BLEs. PT demo & verbal cues on technique.  Lateral step up BOSU round side up with BUE support, reaching contralateral LE across in front & in back 10 reps with ea LE.  Seated hamstring curl blue theraband on BLEs  1x15 reps Seated LAQ 5# 10 reps 2 sets BLEs  Neuromuscular Re-education: Standing crossways on foam beam to facilitate hip strategy in corner: eyes open head motion right/left, up/down & diagonal 10 reps ea.  Static eyes closed 10 sec 3 reps   Modalities Vaso to right knee 34* medium compression 10 min with LE elevation.    07/31/2022: Therapeutic Exercise: Precor recumbent bike seat 6 level 3 full revolutions for knee range 8 min Step up & over BOSU round side up with BUE support, 2 step approach 10 reps with BLEs. PT demo & verbal cues on technique.  Lateral step up BOSU round side up with BUE support, reaching contralateral LE across in front & in back 10 reps with ea LE.  Seated hamstring curl blue theraband on BLEs 2x15 reps  Neuromuscular Re-education: Standing on towel roll to facilitate hip strategy in corner: eyes open head motion right/left, up/down & diagonal 5 reps ea.  Static eyes closed 10 sec 3 reps    HOME EXERCISE PROGRAM: Access Code: [email or web address] URL: [email or web address] Date: 07/17/2022 Prepared by: Vladimir Faster  Exercises - Ankle Alphabet in Elevation  - 2-4 x daily - 7 x weekly - 1 sets - 1 reps - supine quad set with towel roll under ankle  - 2-3 x daily - 7 x weekly - 2-3 sets - 10 reps - 5 seconds hold - Supine Heel Slide with Strap  - 2-3 x daily - 7 x  weekly - 2-3 sets - 10 reps - 5 seconds hold - Supine Knee Extension Strengthening  - 2-3 x daily - 7 x weekly - 2-3 sets -  10 reps - 5 seconds hold - Supine Straight Leg Raises  - 2-3 x daily - 7 x weekly - 2-3 sets - 10 reps - 5 seconds hold - Seated Knee Flexion Extension AROM   - 2-4 x daily - 7 x weekly - 2-3 sets - 10 reps - 5 seconds hold - Seated straight leg lifts  - 2-3 x daily - 7 x weekly - 2-3 sets - 10 reps - 5 seconds hold - Seated Hamstring Stretch with Strap  - 2-4 x daily - 7 x weekly - 1 sets - 3 reps - 20-30 seconds hold - Seated Long Arc Quad  - 2-4 x daily - 7 x weekly - 2-3 sets - 10 reps - 5 seconds hold - Seated Knee Flexion AAROM  - 3 x daily - 7 x weekly - 1 sets - 10 reps - 10 seconds hold - Seated Table Hamstring Stretch  - 2 x daily - 7 x weekly - 1 sets - 3 reps - 30 seconds hold - Supine Quadriceps Stretch with Strap on Table  - 2 x daily - 7 x weekly - 1 sets - 3 reps - 30 seconds hold - standing calf stretch with forefoot on small step or brick  - 2 x daily - 7 x weekly - 1 sets - 3 reps - 30 seconds hold - Supine ITB Stretch with Strap  - 1 x daily - 7 x weekly - 1 sets - 3 reps - 30 seconds hold - squat over chair like "dirty toilet"  - 1 x daily - 7 x weekly - 2-3 sets - 10 reps - 5 seconds hold - Seated Knee Extension Stretch with Chair  - 1-3 x daily - 7 x weekly - 1 sets - 2-3 reps - 10-20 seconds hold - standing knee extension  - 1 x daily - 7 x weekly - 2 sets - 10 reps - 5 seconds hold   ASSESSMENT: CLINICAL IMPRESSION: Right knee ext strength is 60% of left knee and target is 75% by discharge.  Pt's functional range continues to improve with progressive PT activities.  Her balance has improved.  Pt continues to benefit from skilled PT.   OBJECTIVE IMPAIRMENTS: Abnormal gait, decreased activity tolerance, decreased balance, decreased endurance, decreased knowledge of condition, decreased knowledge of use of DME, decreased mobility, difficulty walking, decreased ROM, decreased strength, increased edema, increased muscle spasms, postural dysfunction, and pain.   ACTIVITY  LIMITATIONS: carrying, lifting, bending, sitting, standing, squatting, sleeping, stairs, transfers, bed mobility, and locomotion level  PARTICIPATION LIMITATIONS: meal prep, cleaning, laundry, driving, and community activity  PERSONAL FACTORS: Age, Fitness, Past/current experiences, Time since onset of injury/illness/exacerbation, and 3+ comorbidities: see PMH  are also affecting patient's functional outcome.   REHAB POTENTIAL: Good  CLINICAL DECISION MAKING: Stable/uncomplicated  EVALUATION COMPLEXITY: Low   GOALS: Goals reviewed with patient? Yes  SHORT TERM GOALS: (target date for Short term goals 07/21/2022)   1.  Patient will demonstrate independent use of home exercise program to maintain progress from in clinic treatments. Goal status: MET 07/17/2022  2. PROM right knee ext -2* and flexion 100* Goal status:  MET 07/05/2022  3. Interim FOTO >/= 40%  Goal status: MET 07/12/2022  LONG TERM GOALS: (target dates for all long term goals  09/08/2022 )  1. Patient will demonstrate/report pain at worst less than or equal to 2/10 to facilitate minimal limitation in daily activity secondary to pain symptoms.  Goal status: Ongoing 08/02/2022   2. Patient will demonstrate independent use of home exercise program to facilitate ability to maintain/progress functional gains from skilled physical therapy services.  Goal status: Ongoing 08/02/2022   3. Patient will demonstrate FOTO outcome > or = 57 % to indicate reduced disability due to condition.  Goal status: OOngoing 08/02/2022   4.  Patient will demonstrate right knee MMT >/= 4/5 throughout to faciltiate usual transfers, stairs, squatting at [facility] for daily life.   Goal status: Ongoing 08/02/2022   5.  Patient AROM right knee ext 0* and flexion 100* Goal status: Ongoing 08/02/2022   6.  patient amb >500' & neg ramp/curb with LRAD modified independent.  Goal status: Ongoing 08/02/2022    PLAN:  PT FREQUENCY:  2x/week  PT  DURATION: 12 weeks  PLANNED INTERVENTIONS: Therapeutic exercises, Therapeutic activity, Neuro Muscular re-education, Balance training, Gait training, Patient/Family education, Joint mobilization, Stair training, DME instructions, Dry Needling, Electrical stimulation, Traction, Cryotherapy, vasopneumatic deviceMoist heat, Taping, Ultrasound, Ionotophoresis 4mg /ml Dexamethasone, and aquatic therapy, Manual therapy.  All included unless contraindicated  PLAN FOR NEXT SESSION:  aquatic PT session next visit to establish pool HEP.  Check MD note for 8/1 visit.  continue with exercises for standing for strength, balance & functional activities, vaso for edema prn.   Next MD visit: 08/10/2022  Vladimir Faster, PT, DPT 08/07/2022, 2:51 PM

## 2022-08-08 DIAGNOSIS — G4700 Insomnia, unspecified: Secondary | ICD-10-CM | POA: Diagnosis not present

## 2022-08-08 DIAGNOSIS — F330 Major depressive disorder, recurrent, mild: Secondary | ICD-10-CM | POA: Diagnosis not present

## 2022-08-08 DIAGNOSIS — F419 Anxiety disorder, unspecified: Secondary | ICD-10-CM | POA: Diagnosis not present

## 2022-08-09 ENCOUNTER — Encounter: Payer: Medicare Other | Admitting: Physical Therapy

## 2022-08-10 ENCOUNTER — Encounter: Payer: Self-pay | Admitting: Orthopaedic Surgery

## 2022-08-10 ENCOUNTER — Ambulatory Visit (INDEPENDENT_AMBULATORY_CARE_PROVIDER_SITE_OTHER): Payer: Medicare Other | Admitting: Orthopaedic Surgery

## 2022-08-10 DIAGNOSIS — Z96651 Presence of right artificial knee joint: Secondary | ICD-10-CM

## 2022-08-10 NOTE — Progress Notes (Signed)
The patient is now 9 weeks status post a right total knee arthroplasty.  She has remote history of a left hip replacement.  She has been going through outpatient physical therapy.  She is transitioning to some aquatic therapy at the end of the week.  She said that she is doing well.  She still having some pain at night and can get uncomfortable but overall she continues to make progress.  She still ambulates with a cane.  She is a healthy 77 year old female.  Examination of her right operative knee shows still some swelling to be expected.  Extension is almost full and the flexion is really good as well.  The knee feels ligamentously stable.  From my standpoint the next time I need to see her is in 3 months.  Will have a standing AP and lateral of her right knee at that visit.  All questions and concerns were addressed and answered.

## 2022-08-11 ENCOUNTER — Ambulatory Visit (INDEPENDENT_AMBULATORY_CARE_PROVIDER_SITE_OTHER): Payer: Medicare Other | Admitting: Physical Therapy

## 2022-08-11 ENCOUNTER — Encounter: Payer: Self-pay | Admitting: Physical Therapy

## 2022-08-11 DIAGNOSIS — M6281 Muscle weakness (generalized): Secondary | ICD-10-CM

## 2022-08-11 DIAGNOSIS — M25661 Stiffness of right knee, not elsewhere classified: Secondary | ICD-10-CM

## 2022-08-11 DIAGNOSIS — M25561 Pain in right knee: Secondary | ICD-10-CM

## 2022-08-11 DIAGNOSIS — R600 Localized edema: Secondary | ICD-10-CM

## 2022-08-11 DIAGNOSIS — R2689 Other abnormalities of gait and mobility: Secondary | ICD-10-CM

## 2022-08-11 NOTE — Therapy (Signed)
OUTPATIENT PHYSICAL THERAPY LOWER EXTREMITY TREATMENT   Patient Name: [name] MRN: [id number] DOB:[birth year], 77 y.o., female Today's Date: 08/11/2022   END OF SESSION:  PT End of Session - 08/11/22 1011     Visit Number 16    Number of Visits 24    Date for PT Re-Evaluation 09/08/22    Authorization Type Medicare A&B and [employer] Fed emp    Progress Note Due on Visit 25    PT Start Time 1005    PT Stop Time 1050    PT Time Calculation (min) 45 min    Activity Tolerance Patient tolerated treatment well;Patient limited by pain;No increased pain    Behavior During Therapy [facility] for tasks assessed/performed                        Past Medical History:  Diagnosis Date   Anemia    Congenital musculoskeletal deformity of spine    Depression    Headache    Hypertension    Incontinence of bowel 09/2013   Lumbago    Lumbosacral spondylosis without myelopathy    Sleep apnea    Sleep disorder    uses gabapentin at bedtime    Spinal stenosis, lumbar region, without neurogenic claudication    Past Surgical History:  Procedure Laterality Date   ANKLE SURGERY Right 2015   lengthened the achilles    ANTERIOR HIP REVISION Right 10/07/2020   Procedure: RIGHT HIP REVISION OF  HIP BALL/POLY-LINER;  Surgeon: Kathryne Hitch, MD;  Location: [facility] OR;  Service: Orthopedics;  Laterality: Right;  RNFA PLEASE   AUGMENTATION MAMMAPLASTY Bilateral 2000 or 2001 unsure    Left Implant has busted and is no longer visible   BACK SURGERY  2015   fusion , Dr Noel Gerold at [facility]; has 2 metal rods in place , fusion from t2 to s1    BACK SURGERY  01/2017   Dr Sharolyn Douglas ; repair of cracked titanium rod in back lumbar    BREAST EXCISIONAL BIOPSY Left    30 yrs ago   bunionectomy  Bilateral 1980s   multiple    COLONOSCOPY     with polypectomy ; q 5 years    FOOT SURGERY  07/2011   SHOULDER SURGERY Left 2013   rotator cuff    TOTAL HIP ARTHROPLASTY Right 05/08/2017    Procedure: RIGHT TOTAL HIP ARTHROPLASTY ANTERIOR APPROACH;  Surgeon: Durene Romans, MD;  Location: [facility];  Service: Orthopedics;  Laterality: Right;  70 mins   TOTAL KNEE ARTHROPLASTY Right 06/06/2022   Procedure: RIGHT TOTAL KNEE ARTHROPLASTY;  Surgeon: Kathryne Hitch, MD;  Location: [facility] OR;  Service: Orthopedics;  Laterality: Right;   Patient Active Problem List   Diagnosis Date Noted   Status post total right knee replacement 06/06/2022   Leg length discrepancy, Right 10/07/2020   History of revision of total replacement of right hip joint 10/07/2020   Multinodular goiter 05/18/2020   Hip contracture, unspecified laterality 07/02/2018   Overweight (BMI 25.0-29.9) 05/09/2017   S/P right THA, AA 05/08/2017   Chronic bilateral low back pain without sciatica 04/05/2015   Disorder of sacroiliac joint 12/22/2014   Chronic low back pain 12/22/2014   Acquired scoliosis 11/27/2011   Lumbosacral spondylosis without myelopathy 05/22/2011    PCP: Jarrett Soho, MD  REFERRING PROVIDER: Kathryne Hitch, MD  REFERRING DIAG:  [phone] (ICD-10-CM) - Status post total right knee replacement  M17.11 (ICD-10-CM) - Unilateral  primary osteoarthritis, right knee   THERAPY DIAG:  Acute pain of right knee  Stiffness of right knee, not elsewhere classified  Other abnormalities of gait and mobility  Muscle weakness (generalized)  Localized edema  Rationale for Evaluation and Treatment: Rehabilitation  ONSET DATE: 06/06/2022 TKA  SUBJECTIVE:   SUBJECTIVE STATEMENT: She relays some knee soreness today and some back pain when she woke up, she has not had any meds yet since she has not eaten anything yet   PERTINENT HISTORY: Right TKA 06/06/2022, OA, leg length discrepancy, right Total hip replacement anterior approach with revision 2022, hip contracture, chronic LBP with surgery, acquired scoliosis, HTN, ankle surgery lengthen Achilles,   PAIN:  NPRS scale: 4-6 today  Pain  location: right knee along joint line Pain description: at rest dull ache and intermittent spasm & sharp pain Aggravating factors: up too long, first moving if knee has gotten stiffen Relieving factors: Oxy, muscle relax & ice  PRECAUTIONS: Fall  WEIGHT BEARING RESTRICTIONS: No  FALLS:  Has patient fallen in last 6 months? No  LIVING ENVIRONMENT: Lives with: lives with their spouse and 2 dogs 12-14# Lives in: [address] house with master bedroom & bathroom main level Stairs: Yes: Internal: 16-18 steps; on left going up and External: 3 steps; none Has following equipment at home: Single point cane, Environmental consultant - 2 wheeled, Marine scientist  OCCUPATION: retired from Engineer, [employer] (consulting),   PLOF: Independent  PATIENT GOALS:  walk & be active throughout her day, go upstairs to sewing room, sewing machine uses RLE   OBJECTIVE:  DIAGNOSTIC FINDINGS: 06/06/2022 post op X-ray showed right knee arthroplasty without immediate postoperative complication.  PATIENT SURVEYS:  07/12/2022: visit 8 FOTO 50% 06/21/2022:   FOTO intake:  29%  predicted:  57%  COGNITION: Overall cognitive status: WFL    SENSATION: WFL  EDEMA:  06/21/2022:   Circumferential:  LLE: above knee 43cm,  around knee 39.5cm, below knee 35 cm RLE: above knee 45.7cm,  around knee 40.8cm, below knee 38.9 cm  POSTURE: 06/21/2022:   rounded shoulders, forward head, flexed trunk , and weight shift left  PALPATION: 06/21/2022:   Right knee tenderness along joint line, right ITB tightness & tenderness  LOWER EXTREMITY ROM:   ROM Right eval Right 06/23/22 Right 06/28/22 Right 07/05/22 Right 07/12/22 Right 07/19/22 Right 07/24/22 Right 08/02/22 Right 08/07/22  Knee flexion Seated A:84* P: 93* Active 98 Seated A: 100* P: 104* Supine P: 116* Seated A: 111*  Seated P: 120* A: 114*  Seated P: 123* A: 119*  Knee extension Supine P: -7* Active -4 Standing A: -4* Supine P: -1* Seated A: LAQ -9* Standing A:-2* Seated A: -5*  Seated LAQ A: -2* Seated LAQ A: -1* Seated LAQ A: 0*   (Blank rows = not tested)  LOWER EXTREMITY MMT:  MMT Right eval Left 08/07/22 Right 08/07/22  Knee flexion 3-/5    Knee extension 3-/5 HH dynameter 71# & 78.2# HH dynameter 45.7# & 44.1# RLE:LLE 60%   (Blank rows = not tested)   FUNCTIONAL TESTS:  06/21/2022:   22 inch chair transfer: requires use of BUEs on armrest, RLE extended with minimal assistance and requires RW for stabilization  GAIT: 06/21/2022:   Distance walked: 100' Assistive device utilized: Environmental consultant - 2 wheeled Level of assistance: SBA Comments: RLE decreased stance duration, abduction, right knee flexed in stance with minimal increase in flexion for swing.    TODAY'S TREATMENT  DATE: 08/11/22 Pt seen for aquatic therapy today.  Treatment took place in water 3.5-4.75 ft in depth at the [facility] pool. Temp of water was 91.  Pt entered/exited the pool via stairs with hand rail.   Pt requires the buoyancy and hydrostatic pressure of water for support, and to offload joints by unweighting joint load by at least 50 % in navel deep water and by at least 75-80% in chest to neck deep water.  Viscosity of the water is needed for resistance of strengthening. Water current perturbations provides challenge to standing balance requiring increased core activation. Aquatic PT exercises Standing lumbar flexion stretch holding kickboard 10 sec X 10 Seated on pool bench and knee to chest stretch 20 sec X 3 bilat Sidestepping length of pool 3 round trips  Forward walking  3 round trips backward walking 3 round trips March walking with dumbells 3 round trip Tandem walking with dumbells 3 round trips Leg swings hip abd/add X 20 bilat Leg swings hip flexion/extension X 20 bilat Step ups onto first step of pool with UE support X 10 bilat Squats on first step of pool with UE support X 15  shoulder  extension X15 with kickboard 1/4 lunge with push/pull kickboard 2X15 Lunge step to pool wall X 10 each  08/07/2022: Therapeutic Exercise: Precor recumbent bike seat 6 level 3 full revolutions for knee range 8 min Step up & over BOSU round side up with BUE support, 2 step approach 10 reps with BLEs. PT demo & verbal cues on technique.  Lateral step up BOSU round side up with BUE support, reaching contralateral LE across in front & in back 10 reps with ea LE.  Seated hamstring curl blue theraband on BLEs  2 x10 reps Seated LAQ 5# 10 reps 2 sets BLEs  Neuromuscular Re-education: Rocker board with light BUE support //bars: 1 min ea ant/post & med/lat  1st set with square board double pivot and 2nd set round board single pivot (requires control of other plane of motion).   Modalities Vaso to right knee 34* medium compression 10 min with LE elevation.   08/02/2022: Therapeutic Exercise: PT verbally educated on need for ongoing fitness plan to include flexibility, strength, endurance & balance.  PT recommending strarting back into gym 1x/wk for understanding exercises and develop routine following discharge. Start with bike 10 min 2 sets with 5 min rest.  Pt verbalized understanding.  Precor recumbent bike seat 6 level 3 full revolutions for knee range 8 min Step up & over BOSU round side up with BUE support, 2 step approach 10 reps with BLEs. PT demo & verbal cues on technique.  Lateral step up BOSU round side up with BUE support, reaching contralateral LE across in front & in back 10 reps with ea LE.  Seated hamstring curl blue theraband on BLEs  1x15 reps Seated LAQ 5# 10 reps 2 sets BLEs  Neuromuscular Re-education: Standing crossways on foam beam to facilitate hip strategy in corner: eyes open head motion right/left, up/down & diagonal 10 reps ea.  Static eyes closed 10 sec 3 reps   Modalities Vaso to right knee 34* medium compression 10 min with LE  elevation.    07/31/2022: Therapeutic Exercise: Precor recumbent bike seat 6 level 3 full revolutions for knee range 8 min Step up & over BOSU round side up with BUE support, 2 step approach 10 reps with BLEs. PT demo & verbal cues on technique.  Lateral step up BOSU round side up  with BUE support, reaching contralateral LE across in front & in back 10 reps with ea LE.  Seated hamstring curl blue theraband on BLEs 2x15 reps  Neuromuscular Re-education: Standing on towel roll to facilitate hip strategy in corner: eyes open head motion right/left, up/down & diagonal 5 reps ea.  Static eyes closed 10 sec 3 reps    HOME EXERCISE PROGRAM: Access Code: [email or web address] URL: [email or web address] Date: 07/17/2022 Prepared by: Vladimir Faster  Exercises - Ankle Alphabet in Elevation  - 2-4 x daily - 7 x weekly - 1 sets - 1 reps - supine quad set with towel roll under ankle  - 2-3 x daily - 7 x weekly - 2-3 sets - 10 reps - 5 seconds hold - Supine Heel Slide with Strap  - 2-3 x daily - 7 x weekly - 2-3 sets - 10 reps - 5 seconds hold - Supine Knee Extension Strengthening  - 2-3 x daily - 7 x weekly - 2-3 sets - 10 reps - 5 seconds hold - Supine Straight Leg Raises  - 2-3 x daily - 7 x weekly - 2-3 sets - 10 reps - 5 seconds hold - Seated Knee Flexion Extension AROM   - 2-4 x daily - 7 x weekly - 2-3 sets - 10 reps - 5 seconds hold - Seated straight leg lifts  - 2-3 x daily - 7 x weekly - 2-3 sets - 10 reps - 5 seconds hold - Seated Hamstring Stretch with Strap  - 2-4 x daily - 7 x weekly - 1 sets - 3 reps - 20-30 seconds hold - Seated Long Arc Quad  - 2-4 x daily - 7 x weekly - 2-3 sets - 10 reps - 5 seconds hold - Seated Knee Flexion AAROM  - 3 x daily - 7 x weekly - 1 sets - 10 reps - 10 seconds hold - Seated Table Hamstring Stretch  - 2 x daily - 7 x weekly - 1 sets - 3 reps - 30 seconds hold - Supine Quadriceps Stretch with Strap on Table  - 2 x daily - 7 x weekly - 1 sets - 3 reps -  30 seconds hold - standing calf stretch with forefoot on small step or brick  - 2 x daily - 7 x weekly - 1 sets - 3 reps - 30 seconds hold - Supine ITB Stretch with Strap  - 1 x daily - 7 x weekly - 1 sets - 3 reps - 30 seconds hold - squat over chair like "dirty toilet"  - 1 x daily - 7 x weekly - 2-3 sets - 10 reps - 5 seconds hold - Seated Knee Extension Stretch with Chair  - 1-3 x daily - 7 x weekly - 1 sets - 2-3 reps - 10-20 seconds hold - standing knee extension  - 1 x daily - 7 x weekly - 2 sets - 10 reps - 5 seconds hold  Aquatic HEP Access Code: [email or web address] URL: [email or web address] Date: 08/11/2022 Prepared by: Ivery Quale  Exercises - Forward Walking  - 1 x daily - 2 x weekly - 4 sets - Side Stepping  - 2 x daily - 2 x weekly - 4 sets - 10 reps - Backward Walking  - 1 x daily - 2 x weekly - 4 sets - Standing March at [facility]  - 1 x daily - 2 x weekly - 15 reps - Standing Hip Flexion Extension at [employer]  - 1 x daily -  2 x weekly - 15 reps - Standing Hip Abduction Adduction at Pool Wall  - 1 x daily - 2 x weekly - 20 reps - Standing Knee Flexion  - 2 x daily - 6 x weekly - 3 sets - 10 reps - Squat  - 1 x daily - 2 x weekly - 15 reps - Lunge to [employer] at [employer]  - 1 x daily - 2 x weekly - 1 sets - 20 reps - Forward Monster Walk  - 1 x daily - 2 x weekly - 4 sets - Standing Single Leg Hip Circles  - 1 x daily - 2 x weekly - 1 sets - 20 reps - Standing Lumbar Spine Flexion Stretch Counter  - 2 x daily - 6 x weekly - 1 sets - 3 reps - 10 hold  ASSESSMENT: CLINICAL IMPRESSION: She had her first aquatic PT session to show her water exercises that she can perform independently at the pool where she is a member. She had good overall tolerance to session and shows good understanding of exercises. Pt continues to benefit from skilled PT. It appears she had good MD follow up visit and will not need to see MD again for 3 months.   OBJECTIVE IMPAIRMENTS: Abnormal  gait, decreased activity tolerance, decreased balance, decreased endurance, decreased knowledge of condition, decreased knowledge of use of DME, decreased mobility, difficulty walking, decreased ROM, decreased strength, increased edema, increased muscle spasms, postural dysfunction, and pain.   ACTIVITY LIMITATIONS: carrying, lifting, bending, sitting, standing, squatting, sleeping, stairs, transfers, bed mobility, and locomotion level  PARTICIPATION LIMITATIONS: meal prep, cleaning, laundry, driving, and community activity  PERSONAL FACTORS: Age, Fitness, Past/current experiences, Time since onset of injury/illness/exacerbation, and 3+ comorbidities: see PMH  are also affecting patient's functional outcome.   REHAB POTENTIAL: Good  CLINICAL DECISION MAKING: Stable/uncomplicated  EVALUATION COMPLEXITY: Low   GOALS: Goals reviewed with patient? Yes  SHORT TERM GOALS: (target date for Short term goals 07/21/2022)   1.  Patient will demonstrate independent use of home exercise program to maintain progress from in clinic treatments. Goal status: MET 07/17/2022  2. PROM right knee ext -2* and flexion 100* Goal status:  MET 07/05/2022  3. Interim FOTO >/= 40%  Goal status: MET 07/12/2022  LONG TERM GOALS: (target dates for all long term goals  09/08/2022 )   1. Patient will demonstrate/report pain at worst less than or equal to 2/10 to facilitate minimal limitation in daily activity secondary to pain symptoms.  Goal status: Ongoing 08/02/2022   2. Patient will demonstrate independent use of home exercise program to facilitate ability to maintain/progress functional gains from skilled physical therapy services.  Goal status: Ongoing 08/02/2022   3. Patient will demonstrate FOTO outcome > or = 57 % to indicate reduced disability due to condition.  Goal status: OOngoing 08/02/2022   4.  Patient will demonstrate right knee MMT >/= 4/5 throughout to faciltiate usual transfers, stairs, squatting  at [facility] for daily life.   Goal status: Ongoing 08/02/2022   5.  Patient AROM right knee ext 0* and flexion 100* Goal status: Ongoing 08/02/2022   6.  patient amb >500' & neg ramp/curb with LRAD modified independent.  Goal status: Ongoing 08/02/2022    PLAN:  PT FREQUENCY:  2x/week  PT DURATION: 12 weeks  PLANNED INTERVENTIONS: Therapeutic exercises, Therapeutic activity, Neuro Muscular re-education, Balance training, Gait training, Patient/Family education, Joint mobilization, Stair training, DME instructions, Dry Needling, Electrical stimulation, Traction, Cryotherapy, vasopneumatic deviceMoist heat,  Taping, Ultrasound, Ionotophoresis 4mg /ml Dexamethasone, and aquatic therapy, Manual therapy.  All included unless contraindicated  PLAN FOR NEXT SESSION:  continue with exercises for standing for strength, balance & functional activities, vaso for edema prn.     April Manson, PT, DPT 08/11/2022, 10:13 AM

## 2022-08-14 ENCOUNTER — Ambulatory Visit (INDEPENDENT_AMBULATORY_CARE_PROVIDER_SITE_OTHER): Payer: Medicare Other | Admitting: Physical Therapy

## 2022-08-14 ENCOUNTER — Encounter: Payer: Self-pay | Admitting: Physical Therapy

## 2022-08-14 DIAGNOSIS — M6281 Muscle weakness (generalized): Secondary | ICD-10-CM

## 2022-08-14 DIAGNOSIS — R600 Localized edema: Secondary | ICD-10-CM | POA: Diagnosis not present

## 2022-08-14 DIAGNOSIS — R2689 Other abnormalities of gait and mobility: Secondary | ICD-10-CM

## 2022-08-14 DIAGNOSIS — R2681 Unsteadiness on feet: Secondary | ICD-10-CM

## 2022-08-14 DIAGNOSIS — M25661 Stiffness of right knee, not elsewhere classified: Secondary | ICD-10-CM | POA: Diagnosis not present

## 2022-08-14 DIAGNOSIS — M25561 Pain in right knee: Secondary | ICD-10-CM

## 2022-08-14 NOTE — Therapy (Signed)
OUTPATIENT PHYSICAL THERAPY LOWER EXTREMITY TREATMENT   Patient Name: [name] MRN: [id number] DOB:05/04/1945, 77 y.o., female Today's Date: 08/14/2022   END OF SESSION:  PT End of Session - 08/14/22 1306     Visit Number 17    Number of Visits 24    Date for PT Re-Evaluation 09/08/22    Authorization Type Medicare A&B and [employer]BS Fed emp    Progress Note Due on Visit 25    PT Start Time 1304    PT Stop Time 1355    PT Time Calculation (min) 51 min    Activity Tolerance Patient tolerated treatment well;Patient limited by pain;No increased pain    Behavior During Therapy [facility] for tasks assessed/performed                         Past Medical History:  Diagnosis Date   Anemia    Congenital musculoskeletal deformity of spine    Depression    Headache    Hypertension    Incontinence of bowel 09/2013   Lumbago    Lumbosacral spondylosis without myelopathy    Sleep apnea    Sleep disorder    uses gabapentin at bedtime    Spinal stenosis, lumbar region, without neurogenic claudication    Past Surgical History:  Procedure Laterality Date   ANKLE SURGERY Right 2015   lengthened the achilles    ANTERIOR HIP REVISION Right 10/07/2020   Procedure: RIGHT HIP REVISION OF  HIP BALL/POLY-LINER;  Surgeon: Kathryne Hitch, MD;  Location: [facility] OR;  Service: Orthopedics;  Laterality: Right;  RNFA PLEASE   AUGMENTATION MAMMAPLASTY Bilateral 2000 or 2001 unsure    Left Implant has busted and is no longer visible   BACK SURGERY  2015   fusion , Dr Noel Gerold at [facility]; has 2 metal rods in place , fusion from t2 to s1    BACK SURGERY  01/2017   Dr Sharolyn Douglas ; repair of cracked titanium rod in back lumbar    BREAST EXCISIONAL BIOPSY Left    30 yrs ago   bunionectomy  Bilateral 1980s   multiple    COLONOSCOPY     with polypectomy ; q 5 years    FOOT SURGERY  07/2011   SHOULDER SURGERY Left 2013   rotator cuff    TOTAL HIP ARTHROPLASTY Right 05/08/2017    Procedure: RIGHT TOTAL HIP ARTHROPLASTY ANTERIOR APPROACH;  Surgeon: Durene Romans, MD;  Location: [facility];  Service: Orthopedics;  Laterality: Right;  70 mins   TOTAL KNEE ARTHROPLASTY Right 06/06/2022   Procedure: RIGHT TOTAL KNEE ARTHROPLASTY;  Surgeon: Kathryne Hitch, MD;  Location: [facility] OR;  Service: Orthopedics;  Laterality: Right;   Patient Active Problem List   Diagnosis Date Noted   Status post total right knee replacement 06/06/2022   Leg length discrepancy, Right 10/07/2020   History of revision of total replacement of right hip joint 10/07/2020   Multinodular goiter 05/18/2020   Hip contracture, unspecified laterality 07/02/2018   Overweight (BMI 25.0-29.9) 05/09/2017   S/P right THA, AA 05/08/2017   Chronic bilateral low back pain without sciatica 04/05/2015   Disorder of sacroiliac joint 12/22/2014   Chronic low back pain 12/22/2014   Acquired scoliosis 11/27/2011   Lumbosacral spondylosis without myelopathy 05/22/2011    PCP: Jarrett Soho, MD  REFERRING PROVIDER: Kathryne Hitch, MD  REFERRING DIAG:  [phone] (ICD-10-CM) - Status post total right knee replacement  M17.11 (ICD-10-CM) -  Unilateral primary osteoarthritis, right knee   THERAPY DIAG:  Acute pain of right knee  Stiffness of right knee, not elsewhere classified  Other abnormalities of gait and mobility  Muscle weakness (generalized)  Localized edema  Unsteadiness on feet  Rationale for Evaluation and Treatment: Rehabilitation  ONSET DATE: 06/06/2022 TKA  SUBJECTIVE:   SUBJECTIVE STATEMENT: She was tired after the pool and the next day muscle soreness. Sleeping better since pool.   PERTINENT HISTORY: Right TKA 06/06/2022, OA, leg length discrepancy, right Total hip replacement anterior approach with revision 2022, hip contracture, chronic LBP with surgery, acquired scoliosis, HTN, ankle surgery lengthen Achilles,   PAIN:  NPRS scale:  today 2-3/10 and since last PT  0-4/10 Pain location: right knee along joint line Pain description: at rest dull ache and intermittent spasm & sharp pain Aggravating factors: up too long, first moving if knee has gotten stiffen Relieving factors: Oxy, muscle relax & ice  PRECAUTIONS: Fall  WEIGHT BEARING RESTRICTIONS: No  FALLS:  Has patient fallen in last 6 months? No  LIVING ENVIRONMENT: Lives with: lives with their spouse and 2 dogs 12-14# Lives in: [address] house with master bedroom & bathroom main level Stairs: Yes: Internal: 16-18 steps; on left going up and External: 3 steps; none Has following equipment at home: Single point cane, Environmental consultant - 2 wheeled, Marine scientist  OCCUPATION: retired from Engineer, [employer] (consulting),   PLOF: Independent  PATIENT GOALS:  walk & be active throughout her day, go upstairs to sewing room, sewing machine uses RLE   OBJECTIVE:  DIAGNOSTIC FINDINGS: 06/06/2022 post op X-ray showed right knee arthroplasty without immediate postoperative complication.  PATIENT SURVEYS:  07/12/2022: visit 8 FOTO 50% 06/21/2022:   FOTO intake:  29%  predicted:  57%  COGNITION: Overall cognitive status: WFL    SENSATION: WFL  EDEMA:  06/21/2022:   Circumferential:  LLE: above knee 43cm,  around knee 39.5cm, below knee 35 cm RLE: above knee 45.7cm,  around knee 40.8cm, below knee 38.9 cm  POSTURE: 06/21/2022:   rounded shoulders, forward head, flexed trunk , and weight shift left  PALPATION: 06/21/2022:   Right knee tenderness along joint line, right ITB tightness & tenderness  LOWER EXTREMITY ROM:   ROM Right eval Right 06/23/22 Right 06/28/22 Right 07/05/22 Right 07/12/22 Right 07/19/22 Right 07/24/22 Right 08/02/22 Right 08/07/22  Knee flexion Seated A:84* P: 93* Active 98 Seated A: 100* P: 104* Supine P: 116* Seated A: 111*  Seated P: 120* A: 114*  Seated P: 123* A: 119*  Knee extension Supine P: -7* Active -4 Standing A: -4* Supine P: -1* Seated A: LAQ -9* Standing A:-2* Seated A:  -5* Seated LAQ A: -2* Seated LAQ A: -1* Seated LAQ A: 0*   (Blank rows = not tested)  LOWER EXTREMITY MMT:  MMT Right eval Left 08/07/22 Right 08/07/22  Knee flexion 3-/5    Knee extension 3-/5 HH dynameter 71# & 78.2# HH dynameter 45.7# & 44.1# RLE:LLE 60%   (Blank rows = not tested)   FUNCTIONAL TESTS:  08/14/2022: pt stands with right side pelvis ~7/16" lower.  06/21/2022:   22 inch chair transfer: requires use of BUEs on armrest, RLE extended with minimal assistance and requires RW for stabilization  GAIT: 06/21/2022:   Distance walked: 100' Assistive device utilized: Environmental consultant - 2 wheeled Level of assistance: SBA Comments: RLE decreased stance duration, abduction, right knee flexed in stance with minimal increase in flexion for swing.    TODAY'S TREATMENT  DATE: 08/14/2022: Therapeutic Exercise: Precor recumbent bike seat 6 level 3 full revolutions for knee range 8 min Step up & over BOSU round side up with single UE support, 2 step approach 10 reps with BLEs. PT demo & verbal cues on technique.  Lateral step up BOSU round side up with BUE support, reaching contralateral LE across in front & in back 10 reps with ea LE.  Seated hamstring curl blue theraband on BLEs  2 x10 reps Seated LAQ 5# 15 reps alternating LEs BLEs Stepping with RLE on 1/4" & on 3/8" lifts with less Trendelenburg. PT demo & verbal cues on Adjust-a-Lift with recommended starting with 1/8" for 2+ weeks, then can add 1/8" up to 3/8" total.  Pt verbalized understanding.     Modalities Vaso to right knee 34* medium compression 10 min with LE elevation.   08/11/22 Pt seen for aquatic therapy today.  Treatment took place in water 3.5-4.75 ft in depth at the [facility] pool. Temp of water was 91.  Pt entered/exited the pool via stairs with hand rail.   Pt requires the buoyancy and hydrostatic pressure of water for support, and to  offload joints by unweighting joint load by at least 50 % in navel deep water and by at least 75-80% in chest to neck deep water.  Viscosity of the water is needed for resistance of strengthening. Water current perturbations provides challenge to standing balance requiring increased core activation. Aquatic PT exercises Standing lumbar flexion stretch holding kickboard 10 sec X 10 Seated on pool bench and knee to chest stretch 20 sec X 3 bilat Sidestepping length of pool 3 round trips  Forward walking  3 round trips backward walking 3 round trips March walking with dumbells 3 round trip Tandem walking with dumbells 3 round trips Leg swings hip abd/add X 20 bilat Leg swings hip flexion/extension X 20 bilat Step ups onto first step of pool with UE support X 10 bilat Squats on first step of pool with UE support X 15  shoulder extension X15 with kickboard 1/4 lunge with push/pull kickboard 2X15 Lunge step to pool wall X 10 each  08/07/2022: Therapeutic Exercise: Precor recumbent bike seat 6 level 3 full revolutions for knee range 8 min Step up & over BOSU round side up with BUE support, 2 step approach 10 reps with BLEs. PT demo & verbal cues on technique.  Lateral step up BOSU round side up with BUE support, reaching contralateral LE across in front & in back 10 reps with ea LE.  Seated hamstring curl blue theraband on BLEs  2 x10 reps Seated LAQ 5# 10 reps 2 sets BLEs  Neuromuscular Re-education: Rocker board with light BUE support //bars: 1 min ea ant/post & med/lat  1st set with square board double pivot and 2nd set round board single pivot (requires control of other plane of motion).   Modalities Vaso to right knee 34* medium compression 10 min with LE elevation.    HOME EXERCISE PROGRAM: Access Code: [email or web address] URL: [email or web address] Date: 07/17/2022 Prepared by: Vladimir Faster  Exercises - Ankle Alphabet in Elevation  - 2-4 x daily - 7 x weekly - 1 sets - 1  reps - supine quad set with towel roll under ankle  - 2-3 x daily - 7 x weekly - 2-3 sets - 10 reps - 5 seconds hold - Supine Heel Slide with Strap  - 2-3 x daily - 7 x weekly - 2-3 sets - 10 reps - 5 seconds  hold - Supine Knee Extension Strengthening  - 2-3 x daily - 7 x weekly - 2-3 sets - 10 reps - 5 seconds hold - Supine Straight Leg Raises  - 2-3 x daily - 7 x weekly - 2-3 sets - 10 reps - 5 seconds hold - Seated Knee Flexion Extension AROM   - 2-4 x daily - 7 x weekly - 2-3 sets - 10 reps - 5 seconds hold - Seated straight leg lifts  - 2-3 x daily - 7 x weekly - 2-3 sets - 10 reps - 5 seconds hold - Seated Hamstring Stretch with Strap  - 2-4 x daily - 7 x weekly - 1 sets - 3 reps - 20-30 seconds hold - Seated Long Arc Quad  - 2-4 x daily - 7 x weekly - 2-3 sets - 10 reps - 5 seconds hold - Seated Knee Flexion AAROM  - 3 x daily - 7 x weekly - 1 sets - 10 reps - 10 seconds hold - Seated Table Hamstring Stretch  - 2 x daily - 7 x weekly - 1 sets - 3 reps - 30 seconds hold - Supine Quadriceps Stretch with Strap on Table  - 2 x daily - 7 x weekly - 1 sets - 3 reps - 30 seconds hold - standing calf stretch with forefoot on small step or brick  - 2 x daily - 7 x weekly - 1 sets - 3 reps - 30 seconds hold - Supine ITB Stretch with Strap  - 1 x daily - 7 x weekly - 1 sets - 3 reps - 30 seconds hold - squat over chair like "dirty toilet"  - 1 x daily - 7 x weekly - 2-3 sets - 10 reps - 5 seconds hold - Seated Knee Extension Stretch with Chair  - 1-3 x daily - 7 x weekly - 1 sets - 2-3 reps - 10-20 seconds hold - standing knee extension  - 1 x daily - 7 x weekly - 2 sets - 10 reps - 5 seconds hold  Aquatic HEP Access Code: [email or web address] URL: [email or web address] Date: 08/11/2022 Prepared by: Ivery Quale  Exercises - Forward Walking  - 1 x daily - 2 x weekly - 4 sets - Side Stepping  - 2 x daily - 2 x weekly - 4 sets - 10 reps - Backward Walking  - 1 x daily - 2 x weekly - 4 sets -  Standing March at [facility]  - 1 x daily - 2 x weekly - 15 reps - Standing Hip Flexion Extension at [employer]  - 1 x daily - 2 x weekly - 15 reps - Standing Hip Abduction Adduction at [employer]  - 1 x daily - 2 x weekly - 20 reps - Standing Knee Flexion  - 2 x daily - 6 x weekly - 3 sets - 10 reps - Squat  - 1 x daily - 2 x weekly - 15 reps - Lunge to [employer] at [employer]  - 1 x daily - 2 x weekly - 1 sets - 20 reps - Forward Monster Walk  - 1 x daily - 2 x weekly - 4 sets - Standing Single Leg Hip Circles  - 1 x daily - 2 x weekly - 1 sets - 20 reps - Standing Lumbar Spine Flexion Stretch Counter  - 2 x daily - 6 x weekly - 1 sets - 3 reps - 10 hold  ASSESSMENT: CLINICAL IMPRESSION: Patient appears to  have a functional leg length difference that she would need to slowly accommodate with a lift which she appears to understand.  Pt appears to be doing more activities at home. She is reporting less pain. OBJECTIVE IMPAIRMENTS: Abnormal gait, decreased activity tolerance, decreased balance, decreased endurance, decreased knowledge of condition, decreased knowledge of use of DME, decreased mobility, difficulty walking, decreased ROM, decreased strength, increased edema, increased muscle spasms, postural dysfunction, and pain.   ACTIVITY LIMITATIONS: carrying, lifting, bending, sitting, standing, squatting, sleeping, stairs, transfers, bed mobility, and locomotion level  PARTICIPATION LIMITATIONS: meal prep, cleaning, laundry, driving, and community activity  PERSONAL FACTORS: Age, Fitness, Past/current experiences, Time since onset of injury/illness/exacerbation, and 3+ comorbidities: see PMH  are also affecting patient's functional outcome.   REHAB POTENTIAL: Good  CLINICAL DECISION MAKING: Stable/uncomplicated  EVALUATION COMPLEXITY: Low   GOALS: Goals reviewed with patient? Yes  SHORT TERM GOALS: (target date for Short term goals 07/21/2022)   1.  Patient will demonstrate independent  use of home exercise program to maintain progress from in clinic treatments. Goal status: MET 07/17/2022  2. PROM right knee ext -2* and flexion 100* Goal status:  MET 07/05/2022  3. Interim FOTO >/= 40%  Goal status: MET 07/12/2022  LONG TERM GOALS: (target dates for all long term goals  09/08/2022 )   1. Patient will demonstrate/report pain at worst less than or equal to 2/10 to facilitate minimal limitation in daily activity secondary to pain symptoms.  Goal status: Ongoing 08/02/2022   2. Patient will demonstrate independent use of home exercise program to facilitate ability to maintain/progress functional gains from skilled physical therapy services.  Goal status: Ongoing 08/02/2022   3. Patient will demonstrate FOTO outcome > or = 57 % to indicate reduced disability due to condition.  Goal status: OOngoing 08/02/2022   4.  Patient will demonstrate right knee MMT >/= 4/5 throughout to faciltiate usual transfers, stairs, squatting at [facility] for daily life.   Goal status: Ongoing 08/02/2022   5.  Patient AROM right knee ext 0* and flexion 100* Goal status: Ongoing 08/02/2022   6.  patient amb >500' & neg ramp/curb with LRAD modified independent.  Goal status: Ongoing 08/02/2022    PLAN:  PT FREQUENCY:  2x/week  PT DURATION: 12 weeks  PLANNED INTERVENTIONS: Therapeutic exercises, Therapeutic activity, Neuro Muscular re-education, Balance training, Gait training, Patient/Family education, Joint mobilization, Stair training, DME instructions, Dry Needling, Electrical stimulation, Traction, Cryotherapy, vasopneumatic deviceMoist heat, Taping, Ultrasound, Ionotophoresis 4mg /ml Dexamethasone, and aquatic therapy, Manual therapy.  All included unless contraindicated  PLAN FOR NEXT SESSION:  continue work towards LTGs, check if she got Adjust-a-lift.   continue with exercises for standing for strength, balance & functional activities, vaso for edema prn.     Vladimir Faster, PT,  DPT 08/14/2022, 2:15 PM

## 2022-08-16 ENCOUNTER — Encounter: Payer: Self-pay | Admitting: Physical Therapy

## 2022-08-16 ENCOUNTER — Encounter: Payer: Medicare Other | Admitting: Physical Therapy

## 2022-08-16 ENCOUNTER — Ambulatory Visit (INDEPENDENT_AMBULATORY_CARE_PROVIDER_SITE_OTHER): Payer: Medicare Other | Admitting: Physical Therapy

## 2022-08-16 DIAGNOSIS — R600 Localized edema: Secondary | ICD-10-CM | POA: Diagnosis not present

## 2022-08-16 DIAGNOSIS — R2689 Other abnormalities of gait and mobility: Secondary | ICD-10-CM | POA: Diagnosis not present

## 2022-08-16 DIAGNOSIS — M25561 Pain in right knee: Secondary | ICD-10-CM

## 2022-08-16 DIAGNOSIS — M25661 Stiffness of right knee, not elsewhere classified: Secondary | ICD-10-CM

## 2022-08-16 DIAGNOSIS — M6281 Muscle weakness (generalized): Secondary | ICD-10-CM | POA: Diagnosis not present

## 2022-08-16 DIAGNOSIS — R2681 Unsteadiness on feet: Secondary | ICD-10-CM

## 2022-08-16 NOTE — Therapy (Signed)
OUTPATIENT PHYSICAL THERAPY LOWER EXTREMITY TREATMENT   Patient Name: [name] MRN: [id number] DOB:1945-08-16, 77 y.o., female Today's Date: 08/16/2022   END OF SESSION:  PT End of Session - 08/16/22 1103     Visit Number 18    Number of Visits 24    Date for PT Re-Evaluation 09/08/22    Authorization Type Medicare A&B and [employer] Fed emp    Progress Note Due on Visit 25    PT Start Time 1101    PT Stop Time 1157    PT Time Calculation (min) 56 min    Activity Tolerance Patient tolerated treatment well;Patient limited by pain;No increased pain    Behavior During Therapy [facility] for tasks assessed/performed                          Past Medical History:  Diagnosis Date   Anemia    Congenital musculoskeletal deformity of spine    Depression    Headache    Hypertension    Incontinence of bowel 09/2013   Lumbago    Lumbosacral spondylosis without myelopathy    Sleep apnea    Sleep disorder    uses gabapentin at bedtime    Spinal stenosis, lumbar region, without neurogenic claudication    Past Surgical History:  Procedure Laterality Date   ANKLE SURGERY Right 2015   lengthened the achilles    ANTERIOR HIP REVISION Right 10/07/2020   Procedure: RIGHT HIP REVISION OF  HIP BALL/POLY-LINER;  Surgeon: Kathryne Hitch, MD;  Location: [facility] OR;  Service: Orthopedics;  Laterality: Right;  RNFA PLEASE   AUGMENTATION MAMMAPLASTY Bilateral 2000 or 2001 unsure    Left Implant has busted and is no longer visible   BACK SURGERY  2015   fusion , Dr Noel Gerold at [facility]; has 2 metal rods in place , fusion from t2 to s1    BACK SURGERY  01/2017   Dr Sharolyn Douglas ; repair of cracked titanium rod in back lumbar    BREAST EXCISIONAL BIOPSY Left    30 yrs ago   bunionectomy  Bilateral 1980s   multiple    COLONOSCOPY     with polypectomy ; q 5 years    FOOT SURGERY  07/2011   SHOULDER SURGERY Left 2013   rotator cuff    TOTAL HIP ARTHROPLASTY Right  05/08/2017   Procedure: RIGHT TOTAL HIP ARTHROPLASTY ANTERIOR APPROACH;  Surgeon: Durene Romans, MD;  Location: [facility];  Service: Orthopedics;  Laterality: Right;  70 mins   TOTAL KNEE ARTHROPLASTY Right 06/06/2022   Procedure: RIGHT TOTAL KNEE ARTHROPLASTY;  Surgeon: Kathryne Hitch, MD;  Location: [facility] OR;  Service: Orthopedics;  Laterality: Right;   Patient Active Problem List   Diagnosis Date Noted   Status post total right knee replacement 06/06/2022   Leg length discrepancy, Right 10/07/2020   History of revision of total replacement of right hip joint 10/07/2020   Multinodular goiter 05/18/2020   Hip contracture, unspecified laterality 07/02/2018   Overweight (BMI 25.0-29.9) 05/09/2017   S/P right THA, AA 05/08/2017   Chronic bilateral low back pain without sciatica 04/05/2015   Disorder of sacroiliac joint 12/22/2014   Chronic low back pain 12/22/2014   Acquired scoliosis 11/27/2011   Lumbosacral spondylosis without myelopathy 05/22/2011    PCP: Jarrett Soho, MD  REFERRING PROVIDER: Kathryne Hitch, MD  REFERRING DIAG:  [id number] (ICD-10-CM) - Status post total right knee replacement  M17.11 (ICD-10-CM) -  Unilateral primary osteoarthritis, right knee   THERAPY DIAG:  Acute pain of right knee  Stiffness of right knee, not elsewhere classified  Other abnormalities of gait and mobility  Muscle weakness (generalized)  Localized edema  Unsteadiness on feet  Rationale for Evaluation and Treatment: Rehabilitation  ONSET DATE: 06/06/2022 TKA  SUBJECTIVE:   SUBJECTIVE STATEMENT: She bought Adjust-a-lift and put 1/8" in right shoe which appears to help left hip with standing & gait.  She plans to keep height for 2 weeks before adding 1/8".  She went to gym and did leg press & bike 15 min.      PERTINENT HISTORY: Right TKA 06/06/2022, OA, leg length discrepancy, right Total hip replacement anterior approach with revision 2022, hip contracture, chronic LBP  with surgery, acquired scoliosis, HTN, ankle surgery lengthen Achilles,   PAIN:  NPRS scale:  today 2/10 and since last PT 0-4/10 Pain location: right knee along joint line Pain description: at rest dull ache and intermittent spasm & sharp pain Aggravating factors: up too long, first moving if knee has gotten stiffen Relieving factors: Oxy, muscle relax & ice  PRECAUTIONS: Fall  WEIGHT BEARING RESTRICTIONS: No  FALLS:  Has patient fallen in last 6 months? No  LIVING ENVIRONMENT: Lives with: lives with their spouse and 2 dogs 12-14# Lives in: [address] house with master bedroom & bathroom main level Stairs: Yes: Internal: 16-18 steps; on left going up and External: 3 steps; none Has following equipment at home: Single point cane, Environmental consultant - 2 wheeled, Marine scientist  OCCUPATION: retired from Engineer, [employer] (consulting),   PLOF: Independent  PATIENT GOALS:  walk & be active throughout her day, go upstairs to sewing room, sewing machine uses RLE   OBJECTIVE:  DIAGNOSTIC FINDINGS: 06/06/2022 post op X-ray showed right knee arthroplasty without immediate postoperative complication.  PATIENT SURVEYS:  07/12/2022: visit 8 FOTO 50% 06/21/2022:   FOTO intake:  29%  predicted:  57%  COGNITION: Overall cognitive status: WFL    SENSATION: WFL  EDEMA:  06/21/2022:   Circumferential:  LLE: above knee 43cm,  around knee 39.5cm, below knee 35 cm RLE: above knee 45.7cm,  around knee 40.8cm, below knee 38.9 cm  POSTURE: 06/21/2022:   rounded shoulders, forward head, flexed trunk , and weight shift left  PALPATION: 06/21/2022:   Right knee tenderness along joint line, right ITB tightness & tenderness  LOWER EXTREMITY ROM:   ROM Right eval Right 06/23/22 Right 06/28/22 Right 07/05/22 Right 07/12/22 Right 07/19/22 Right 07/24/22 Right 08/02/22 Right 08/07/22  Knee flexion Seated A:84* P: 93* Active 98 Seated A: 100* P: 104* Supine P: 116* Seated A: 111*  Seated P: 120* A: 114*  Seated P: 123* A:  119*  Knee extension Supine P: -7* Active -4 Standing A: -4* Supine P: -1* Seated A: LAQ -9* Standing A:-2* Seated A: -5* Seated LAQ A: -2* Seated LAQ A: -1* Seated LAQ A: 0*   (Blank rows = not tested)  LOWER EXTREMITY MMT:  MMT Right eval Left 08/07/22 Right 08/07/22  Knee flexion 3-/5    Knee extension 3-/5 HH dynameter 71# & 78.2# HH dynameter 45.7# & 44.1# RLE:LLE 60%   (Blank rows = not tested)   FUNCTIONAL TESTS:  08/14/2022: pt stands with right side pelvis ~7/16" lower.  06/21/2022:   22 inch chair transfer: requires use of BUEs on armrest, RLE extended with minimal assistance and requires RW for stabilization  GAIT: 06/21/2022:   Distance walked: 100' Assistive device utilized: Environmental consultant - 2  wheeled Level of assistance: SBA Comments: RLE decreased stance duration, abduction, right knee flexed in stance with minimal increase in flexion for swing.    TODAY'S TREATMENT                                                                          DATE: 08/16/2022: Therapeutic Exercise: Precor recumbent bike seat 6 level 4 full revolutions for knee range 8 min Step up & over BOSU round side up with single UE support, 2 step approach 10 reps with BLEs. PT demo & verbal cues on technique.  Lateral step up BOSU round side up with BUE support, reaching contralateral LE across in front & in back 10 reps with ea LE.  Seated hamstring curl blue theraband on BLEs10 reps Seated LAQ 5# 15 reps alternating LEs BLEs Knee flexion machine BLEs 35# 10 reps;  single leg 15# 10 reps ea LE Knee ext machine BLEs 20# 5 reps; concentric BLEs and isometric / eccentric RLE 10# 10 reps;  RLE concentric, isometric with LLE assist for last few degrees, and eccentric RLE only 5# 10 reps   Modalities Vaso to right knee 34* medium compression 10 min with LE elevation.  08/14/2022: Therapeutic Exercise: Precor recumbent bike seat 6 level 3 full revolutions for knee range 8 min Step up & over BOSU  round side up with single UE support, 2 step approach 10 reps with BLEs. PT demo & verbal cues on technique.  Lateral step up BOSU round side up with BUE support, reaching contralateral LE across in front & in back 10 reps with ea LE.  Seated hamstring curl blue theraband on BLEs  2 x10 reps Seated LAQ 5# 15 reps alternating LEs BLEs Stepping with RLE on 1/4" & on 3/8" lifts with less Trendelenburg. PT demo & verbal cues on Adjust-a-Lift with recommended starting with 1/8" for 2+ weeks, then can add 1/8" up to 3/8" total.  Pt verbalized understanding.     Modalities Vaso to right knee 34* medium compression 10 min with LE elevation.   08/11/22 Pt seen for aquatic therapy today.  Treatment took place in water 3.5-4.75 ft in depth at the [facility] pool. Temp of water was 91.  Pt entered/exited the pool via stairs with hand rail.   Pt requires the buoyancy and hydrostatic pressure of water for support, and to offload joints by unweighting joint load by at least 50 % in navel deep water and by at least 75-80% in chest to neck deep water.  Viscosity of the water is needed for resistance of strengthening. Water current perturbations provides challenge to standing balance requiring increased core activation. Aquatic PT exercises Standing lumbar flexion stretch holding kickboard 10 sec X 10 Seated on pool bench and knee to chest stretch 20 sec X 3 bilat Sidestepping length of pool 3 round trips  Forward walking  3 round trips backward walking 3 round trips March walking with dumbells 3 round trip Tandem walking with dumbells 3 round trips Leg swings hip abd/add X 20 bilat Leg swings hip flexion/extension X 20 bilat Step ups onto first step of pool with UE support X 10 bilat Squats on first step of pool with UE support X 15  shoulder extension X15 with kickboard 1/4 lunge with push/pull kickboard 2X15 Lunge step to pool wall X 10 each    HOME EXERCISE PROGRAM: Access Code:  [email or web address] URL: [email or web address] Date: 07/17/2022 Prepared by: Vladimir Faster  Exercises - Ankle Alphabet in Elevation  - 2-4 x daily - 7 x weekly - 1 sets - 1 reps - supine quad set with towel roll under ankle  - 2-3 x daily - 7 x weekly - 2-3 sets - 10 reps - 5 seconds hold - Supine Heel Slide with Strap  - 2-3 x daily - 7 x weekly - 2-3 sets - 10 reps - 5 seconds hold - Supine Knee Extension Strengthening  - 2-3 x daily - 7 x weekly - 2-3 sets - 10 reps - 5 seconds hold - Supine Straight Leg Raises  - 2-3 x daily - 7 x weekly - 2-3 sets - 10 reps - 5 seconds hold - Seated Knee Flexion Extension AROM   - 2-4 x daily - 7 x weekly - 2-3 sets - 10 reps - 5 seconds hold - Seated straight leg lifts  - 2-3 x daily - 7 x weekly - 2-3 sets - 10 reps - 5 seconds hold - Seated Hamstring Stretch with Strap  - 2-4 x daily - 7 x weekly - 1 sets - 3 reps - 20-30 seconds hold - Seated Long Arc Quad  - 2-4 x daily - 7 x weekly - 2-3 sets - 10 reps - 5 seconds hold - Seated Knee Flexion AAROM  - 3 x daily - 7 x weekly - 1 sets - 10 reps - 10 seconds hold - Seated Table Hamstring Stretch  - 2 x daily - 7 x weekly - 1 sets - 3 reps - 30 seconds hold - Supine Quadriceps Stretch with Strap on Table  - 2 x daily - 7 x weekly - 1 sets - 3 reps - 30 seconds hold - standing calf stretch with forefoot on small step or brick  - 2 x daily - 7 x weekly - 1 sets - 3 reps - 30 seconds hold - Supine ITB Stretch with Strap  - 1 x daily - 7 x weekly - 1 sets - 3 reps - 30 seconds hold - squat over chair like "dirty toilet"  - 1 x daily - 7 x weekly - 2-3 sets - 10 reps - 5 seconds hold - Seated Knee Extension Stretch with Chair  - 1-3 x daily - 7 x weekly - 1 sets - 2-3 reps - 10-20 seconds hold - standing knee extension  - 1 x daily - 7 x weekly - 2 sets - 10 reps - 5 seconds hold  Aquatic HEP Access Code: [email or web address] URL: [email or web address] Date: 08/11/2022 Prepared by: Ivery Quale  Exercises - Forward Walking  - 1 x daily - 2 x weekly - 4 sets - Side Stepping  - 2 x daily - 2 x weekly - 4 sets - 10 reps - Backward Walking  - 1 x daily - 2 x weekly - 4 sets - Standing March at [facility]  - 1 x daily - 2 x weekly - 15 reps - Standing Hip Flexion Extension at [employer]  - 1 x daily - 2 x weekly - 15 reps - Standing Hip Abduction Adduction at Pool Wall  - 1 x daily - 2 x weekly - 20 reps - Standing Knee Flexion  - 2 x daily - 6 x weekly -  3 sets - 10 reps - Squat  - 1 x daily - 2 x weekly - 15 reps - Lunge to [employer] at [employer]  - 1 x daily - 2 x weekly - 1 sets - 20 reps - Forward Monster Walk  - 1 x daily - 2 x weekly - 4 sets - Standing Single Leg Hip Circles  - 1 x daily - 2 x weekly - 1 sets - 20 reps - Standing Lumbar Spine Flexion Stretch Counter  - 2 x daily - 6 x weekly - 1 sets - 3 reps - 10 hold  ASSESSMENT: CLINICAL IMPRESSION: Patient is improving functional strength.  The lift in right shoe appears to help left hip with less trendelenburg with gait with walking stick.   OBJECTIVE IMPAIRMENTS: Abnormal gait, decreased activity tolerance, decreased balance, decreased endurance, decreased knowledge of condition, decreased knowledge of use of DME, decreased mobility, difficulty walking, decreased ROM, decreased strength, increased edema, increased muscle spasms, postural dysfunction, and pain.   ACTIVITY LIMITATIONS: carrying, lifting, bending, sitting, standing, squatting, sleeping, stairs, transfers, bed mobility, and locomotion level  PARTICIPATION LIMITATIONS: meal prep, cleaning, laundry, driving, and community activity  PERSONAL FACTORS: Age, Fitness, Past/current experiences, Time since onset of injury/illness/exacerbation, and 3+ comorbidities: see PMH  are also affecting patient's functional outcome.   REHAB POTENTIAL: Good  CLINICAL DECISION MAKING: Stable/uncomplicated  EVALUATION COMPLEXITY: Low   GOALS: Goals reviewed with patient?  Yes  SHORT TERM GOALS: (target date for Short term goals 07/21/2022)   1.  Patient will demonstrate independent use of home exercise program to maintain progress from in clinic treatments. Goal status: MET 07/17/2022  2. PROM right knee ext -2* and flexion 100* Goal status:  MET 07/05/2022  3. Interim FOTO >/= 40%  Goal status: MET 07/12/2022  LONG TERM GOALS: (target dates for all long term goals  09/08/2022 )   1. Patient will demonstrate/report pain at worst less than or equal to 2/10 to facilitate minimal limitation in daily activity secondary to pain symptoms.  Goal status: Ongoing 08/02/2022   2. Patient will demonstrate independent use of home exercise program to facilitate ability to maintain/progress functional gains from skilled physical therapy services.  Goal status: Ongoing 08/02/2022   3. Patient will demonstrate FOTO outcome > or = 57 % to indicate reduced disability due to condition.  Goal status: OOngoing 08/02/2022   4.  Patient will demonstrate right knee MMT >/= 4/5 throughout to faciltiate usual transfers, stairs, squatting at [facility] for daily life.   Goal status: Ongoing 08/02/2022   5.  Patient AROM right knee ext 0* and flexion 100* Goal status: Ongoing 08/02/2022   6.  patient amb >500' & neg ramp/curb with LRAD modified independent.  Goal status: Ongoing 08/02/2022    PLAN:  PT FREQUENCY:  2x/week  PT DURATION: 12 weeks  PLANNED INTERVENTIONS: Therapeutic exercises, Therapeutic activity, Neuro Muscular re-education, Balance training, Gait training, Patient/Family education, Joint mobilization, Stair training, DME instructions, Dry Needling, Electrical stimulation, Traction, Cryotherapy, vasopneumatic deviceMoist heat, Taping, Ultrasound, Ionotophoresis 4mg /ml Dexamethasone, and aquatic therapy, Manual therapy.  All included unless contraindicated  PLAN FOR NEXT SESSION: work towards LTGs, continue with exercises for standing for strength, balance & functional  activities, vaso for edema prn.     Vladimir Faster, PT, DPT 08/16/2022, 12:11 PM

## 2022-08-21 ENCOUNTER — Encounter: Payer: Medicare Other | Admitting: Physical Therapy

## 2022-08-23 ENCOUNTER — Ambulatory Visit (INDEPENDENT_AMBULATORY_CARE_PROVIDER_SITE_OTHER): Payer: Medicare Other | Admitting: Physical Therapy

## 2022-08-23 ENCOUNTER — Encounter: Payer: Self-pay | Admitting: Physical Therapy

## 2022-08-23 DIAGNOSIS — M6281 Muscle weakness (generalized): Secondary | ICD-10-CM | POA: Diagnosis not present

## 2022-08-23 DIAGNOSIS — R2689 Other abnormalities of gait and mobility: Secondary | ICD-10-CM | POA: Diagnosis not present

## 2022-08-23 DIAGNOSIS — R600 Localized edema: Secondary | ICD-10-CM | POA: Diagnosis not present

## 2022-08-23 DIAGNOSIS — M25561 Pain in right knee: Secondary | ICD-10-CM | POA: Diagnosis not present

## 2022-08-23 DIAGNOSIS — M25661 Stiffness of right knee, not elsewhere classified: Secondary | ICD-10-CM | POA: Diagnosis not present

## 2022-08-23 DIAGNOSIS — R2681 Unsteadiness on feet: Secondary | ICD-10-CM | POA: Diagnosis not present

## 2022-08-23 NOTE — Therapy (Signed)
OUTPATIENT PHYSICAL THERAPY LOWER EXTREMITY TREATMENT   Patient Name: [name] MRN: [id number] DOB:[birth year], 77 y.o., female Today's Date: 08/23/2022   END OF SESSION:  PT End of Session - 08/23/22 1256     Visit Number 19    Number of Visits 24    Date for PT Re-Evaluation 09/08/22    Authorization Type Medicare A&B and [employer] Fed emp    Progress Note Due on Visit 25    PT Start Time 1259    PT Stop Time 1355    PT Time Calculation (min) 56 min    Activity Tolerance Patient tolerated treatment well;Patient limited by pain;No increased pain    Behavior During Therapy [facility] for tasks assessed/performed                           Past Medical History:  Diagnosis Date   Anemia    Congenital musculoskeletal deformity of spine    Depression    Headache    Hypertension    Incontinence of bowel 09/2013   Lumbago    Lumbosacral spondylosis without myelopathy    Sleep apnea    Sleep disorder    uses gabapentin at bedtime    Spinal stenosis, lumbar region, without neurogenic claudication    Past Surgical History:  Procedure Laterality Date   ANKLE SURGERY Right 2015   lengthened the achilles    ANTERIOR HIP REVISION Right 10/07/2020   Procedure: RIGHT HIP REVISION OF  HIP BALL/POLY-LINER;  Surgeon: Kathryne Hitch, MD;  Location: [facility] OR;  Service: Orthopedics;  Laterality: Right;  RNFA PLEASE   AUGMENTATION MAMMAPLASTY Bilateral 2000 or 2001 unsure    Left Implant has busted and is no longer visible   BACK SURGERY  2015   fusion , Dr Noel Gerold at [facility]; has 2 metal rods in place , fusion from t2 to s1    BACK SURGERY  01/2017   Dr Sharolyn Douglas ; repair of cracked titanium rod in back lumbar    BREAST EXCISIONAL BIOPSY Left    30 yrs ago   bunionectomy  Bilateral 1980s   multiple    COLONOSCOPY     with polypectomy ; q 5 years    FOOT SURGERY  07/2011   SHOULDER SURGERY Left 2013   rotator cuff    TOTAL HIP ARTHROPLASTY Right  05/08/2017   Procedure: RIGHT TOTAL HIP ARTHROPLASTY ANTERIOR APPROACH;  Surgeon: Durene Romans, MD;  Location: [facility];  Service: Orthopedics;  Laterality: Right;  70 mins   TOTAL KNEE ARTHROPLASTY Right 06/06/2022   Procedure: RIGHT TOTAL KNEE ARTHROPLASTY;  Surgeon: Kathryne Hitch, MD;  Location: [facility] OR;  Service: Orthopedics;  Laterality: Right;   Patient Active Problem List   Diagnosis Date Noted   Status post total right knee replacement 06/06/2022   Leg length discrepancy, Right 10/07/2020   History of revision of total replacement of right hip joint 10/07/2020   Multinodular goiter 05/18/2020   Hip contracture, unspecified laterality 07/02/2018   Overweight (BMI 25.0-29.9) 05/09/2017   S/P right THA, AA 05/08/2017   Chronic bilateral low back pain without sciatica 04/05/2015   Disorder of sacroiliac joint 12/22/2014   Chronic low back pain 12/22/2014   Acquired scoliosis 11/27/2011   Lumbosacral spondylosis without myelopathy 05/22/2011    PCP: Jarrett Soho, MD  REFERRING PROVIDER: Kathryne Hitch, MD  REFERRING DIAG:  [phone] (ICD-10-CM) - Status post total right knee replacement  M17.11 (  ICD-10-CM) - Unilateral primary osteoarthritis, right knee   THERAPY DIAG:  Acute pain of right knee  Stiffness of right knee, not elsewhere classified  Other abnormalities of gait and mobility  Muscle weakness (generalized)  Localized edema  Unsteadiness on feet  Rationale for Evaluation and Treatment: Rehabilitation  ONSET DATE: 06/06/2022 TKA  SUBJECTIVE:   SUBJECTIVE STATEMENT: She went to rehab pool and did work out.  She is still using 1/8" lift in right shoe.    PERTINENT HISTORY: Right TKA 06/06/2022, OA, leg length discrepancy, right Total hip replacement anterior approach with revision 2022, hip contracture, chronic LBP with surgery, acquired scoliosis, HTN, ankle surgery lengthen Achilles,   PAIN:  NPRS scale:  today  2/10 and since last PT  0-4/10 Pain location: right knee along joint line Pain description: at rest dull ache and intermittent spasm & sharp pain Aggravating factors: up too long, first moving if knee has gotten stiffen Relieving factors: Oxy, muscle relax & ice  PRECAUTIONS: Fall  WEIGHT BEARING RESTRICTIONS: No  FALLS:  Has patient fallen in last 6 months? No  LIVING ENVIRONMENT: Lives with: lives with their spouse and 2 dogs 12-14# Lives in: [address] house with master bedroom & bathroom main level Stairs: Yes: Internal: 16-18 steps; on left going up and External: 3 steps; none Has following equipment at home: Single point cane, Environmental consultant - 2 wheeled, Marine scientist  OCCUPATION: retired from Engineer, [employer] (consulting),   PLOF: Independent  PATIENT GOALS:  walk & be active throughout her day, go upstairs to sewing room, sewing machine uses RLE   OBJECTIVE:  DIAGNOSTIC FINDINGS: 06/06/2022 post op X-ray showed right knee arthroplasty without immediate postoperative complication.  PATIENT SURVEYS:  07/12/2022: visit 8 FOTO 50% 06/21/2022:   FOTO intake:  29%  predicted:  57%  COGNITION: Overall cognitive status: WFL    SENSATION: WFL  EDEMA:  06/21/2022:   Circumferential:  LLE: above knee 43cm,  around knee 39.5cm, below knee 35 cm RLE: above knee 45.7cm,  around knee 40.8cm, below knee 38.9 cm  POSTURE: 06/21/2022:   rounded shoulders, forward head, flexed trunk , and weight shift left  PALPATION: 06/21/2022:   Right knee tenderness along joint line, right ITB tightness & tenderness  LOWER EXTREMITY ROM:   ROM Right eval Right 06/23/22 Right 06/28/22 Right 07/05/22 Right 07/12/22 Right 07/19/22 Right 07/24/22 Right 08/02/22 Right 08/07/22  Knee flexion Seated A:84* P: 93* Active 98 Seated A: 100* P: 104* Supine P: 116* Seated A: 111*  Seated P: 120* A: 114*  Seated P: 123* A: 119*  Knee extension Supine P: -7* Active -4 Standing A: -4* Supine P: -1* Seated A: LAQ -9* Standing A:-2* Seated A:  -5* Seated LAQ A: -2* Seated LAQ A: -1* Seated LAQ A: 0*   (Blank rows = not tested)  LOWER EXTREMITY MMT:  MMT Right eval Left 08/07/22 Right 08/07/22  Knee flexion 3-/5    Knee extension 3-/5 HH dynameter 71# & 78.2# HH dynameter 45.7# & 44.1# RLE:LLE 60%   (Blank rows = not tested)   FUNCTIONAL TESTS:  08/14/2022: pt stands with right side pelvis ~7/16" lower.  06/21/2022:   22 inch chair transfer: requires use of BUEs on armrest, RLE extended with minimal assistance and requires RW for stabilization  GAIT: 06/21/2022:   Distance walked: 100' Assistive device utilized: Environmental consultant - 2 wheeled Level of assistance: SBA Comments: RLE decreased stance duration, abduction, right knee flexed in stance with minimal increase in flexion for swing.  TODAY'S TREATMENT                                                                          DATE: 08/23/2022: Therapeutic Exercise: Precor recumbent bike seat 6 level 4 full revolutions for knee range 8 min Stepping over 6" hurdles with 2 steps bw 6 hurdles leading RLE 3 reps & LLE 3 reps with walking stick support & PT supervision to CGA.  Picking up hurdles with squat with supervision Slider to 3 cones (ant-lat, lat & post-lat) with stance LE flexion on outward motion and ext inward motion with ipsilateral UE support. 5 reps ea LE. Knee flexion machine BLEs 35# 10 reps;  single leg 15# 10 reps ea LE Knee ext machine BLEs 20# 10 reps; concentric BLEs and isometric / eccentric  single LE 10# 10 reps with BLEs; eccentric single LE with BLEs 5# 10 reps ( RLE concentric, isometric with LLE assist for last few degrees) Supine SLR 10 reps 2 sets with BLEs   Modalities Vaso to right knee 34* medium compression 10 min with LE elevation.  08/16/2022: Therapeutic Exercise: Precor recumbent bike seat 6 level 4 full revolutions for knee range 8 min Step up & over BOSU round side up with single UE support, 2 step approach 10 reps with BLEs. PT demo &  verbal cues on technique.  Lateral step up BOSU round side up with BUE support, reaching contralateral LE across in front & in back 10 reps with ea LE.  Seated hamstring curl blue theraband on BLEs10 reps Seated LAQ 5# 15 reps alternating LEs BLEs Knee flexion machine BLEs 35# 10 reps;  single leg 15# 10 reps ea LE Knee ext machine BLEs 20# 5 reps; concentric BLEs and isometric / eccentric RLE 10# 10 reps;  RLE concentric, isometric with LLE assist for last few degrees, and eccentric RLE only 5# 10 reps   Modalities Vaso to right knee 34* medium compression 10 min with LE elevation.  08/14/2022: Therapeutic Exercise: Precor recumbent bike seat 6 level 3 full revolutions for knee range 8 min Step up & over BOSU round side up with single UE support, 2 step approach 10 reps with BLEs. PT demo & verbal cues on technique.  Lateral step up BOSU round side up with BUE support, reaching contralateral LE across in front & in back 10 reps with ea LE.  Seated hamstring curl blue theraband on BLEs  2 x10 reps Seated LAQ 5# 15 reps alternating LEs BLEs Stepping with RLE on 1/4" & on 3/8" lifts with less Trendelenburg. PT demo & verbal cues on Adjust-a-Lift with recommended starting with 1/8" for 2+ weeks, then can add 1/8" up to 3/8" total.  Pt verbalized understanding.     Modalities Vaso to right knee 34* medium compression 10 min with LE elevation.    HOME EXERCISE PROGRAM: Access Code: [email or web address] URL: [email or web address] Date: 07/17/2022 Prepared by: Vladimir Faster  Exercises - Ankle Alphabet in Elevation  - 2-4 x daily - 7 x weekly - 1 sets - 1 reps - supine quad set with towel roll under ankle  - 2-3 x daily - 7 x weekly - 2-3 sets - 10 reps - 5 seconds hold - Supine Heel  Slide with Strap  - 2-3 x daily - 7 x weekly - 2-3 sets - 10 reps - 5 seconds hold - Supine Knee Extension Strengthening  - 2-3 x daily - 7 x weekly - 2-3 sets - 10 reps - 5 seconds hold - Supine Straight  Leg Raises  - 2-3 x daily - 7 x weekly - 2-3 sets - 10 reps - 5 seconds hold - Seated Knee Flexion Extension AROM   - 2-4 x daily - 7 x weekly - 2-3 sets - 10 reps - 5 seconds hold - Seated straight leg lifts  - 2-3 x daily - 7 x weekly - 2-3 sets - 10 reps - 5 seconds hold - Seated Hamstring Stretch with Strap  - 2-4 x daily - 7 x weekly - 1 sets - 3 reps - 20-30 seconds hold - Seated Long Arc Quad  - 2-4 x daily - 7 x weekly - 2-3 sets - 10 reps - 5 seconds hold - Seated Knee Flexion AAROM  - 3 x daily - 7 x weekly - 1 sets - 10 reps - 10 seconds hold - Seated Table Hamstring Stretch  - 2 x daily - 7 x weekly - 1 sets - 3 reps - 30 seconds hold - Supine Quadriceps Stretch with Strap on Table  - 2 x daily - 7 x weekly - 1 sets - 3 reps - 30 seconds hold - standing calf stretch with forefoot on small step or brick  - 2 x daily - 7 x weekly - 1 sets - 3 reps - 30 seconds hold - Supine ITB Stretch with Strap  - 1 x daily - 7 x weekly - 1 sets - 3 reps - 30 seconds hold - squat over chair like "dirty toilet"  - 1 x daily - 7 x weekly - 2-3 sets - 10 reps - 5 seconds hold - Seated Knee Extension Stretch with Chair  - 1-3 x daily - 7 x weekly - 1 sets - 2-3 reps - 10-20 seconds hold - standing knee extension  - 1 x daily - 7 x weekly - 2 sets - 10 reps - 5 seconds hold  Aquatic HEP Access Code: [email or web address] URL: [email or web address] Date: 08/11/2022 Prepared by: Ivery Quale  Exercises - Forward Walking  - 1 x daily - 2 x weekly - 4 sets - Side Stepping  - 2 x daily - 2 x weekly - 4 sets - 10 reps - Backward Walking  - 1 x daily - 2 x weekly - 4 sets - Standing March at [facility]  - 1 x daily - 2 x weekly - 15 reps - Standing Hip Flexion Extension at [employer]  - 1 x daily - 2 x weekly - 15 reps - Standing Hip Abduction Adduction at [employer]  - 1 x daily - 2 x weekly - 20 reps - Standing Knee Flexion  - 2 x daily - 6 x weekly - 3 sets - 10 reps - Squat  - 1 x daily - 2 x weekly -  15 reps - Lunge to Target at [employer]  - 1 x daily - 2 x weekly - 1 sets - 20 reps - Forward Monster Walk  - 1 x daily - 2 x weekly - 4 sets - Standing Single Leg Hip Circles  - 1 x daily - 2 x weekly - 1 sets - 20 reps - Standing Lumbar Spine Flexion Stretch Counter  - 2  x daily - 6 x weekly - 1 sets - 3 reps - 10 hold  ASSESSMENT: CLINICAL IMPRESSION: Patient continues to improve functional strength with PT activities and ongoing HEP.   OBJECTIVE IMPAIRMENTS: Abnormal gait, decreased activity tolerance, decreased balance, decreased endurance, decreased knowledge of condition, decreased knowledge of use of DME, decreased mobility, difficulty walking, decreased ROM, decreased strength, increased edema, increased muscle spasms, postural dysfunction, and pain.   ACTIVITY LIMITATIONS: carrying, lifting, bending, sitting, standing, squatting, sleeping, stairs, transfers, bed mobility, and locomotion level  PARTICIPATION LIMITATIONS: meal prep, cleaning, laundry, driving, and community activity  PERSONAL FACTORS: Age, Fitness, Past/current experiences, Time since onset of injury/illness/exacerbation, and 3+ comorbidities: see PMH  are also affecting patient's functional outcome.   REHAB POTENTIAL: Good  CLINICAL DECISION MAKING: Stable/uncomplicated  EVALUATION COMPLEXITY: Low   GOALS: Goals reviewed with patient? Yes  SHORT TERM GOALS: (target date for Short term goals 07/21/2022)   1.  Patient will demonstrate independent use of home exercise program to maintain progress from in clinic treatments. Goal status: MET 07/17/2022  2. PROM right knee ext -2* and flexion 100* Goal status:  MET 07/05/2022  3. Interim FOTO >/= 40%  Goal status: MET 07/12/2022  LONG TERM GOALS: (target dates for all long term goals  09/08/2022 )   1. Patient will demonstrate/report pain at worst less than or equal to 2/10 to facilitate minimal limitation in daily activity secondary to pain symptoms.  Goal  status: Ongoing 08/21/2022   2. Patient will demonstrate independent use of home exercise program to facilitate ability to maintain/progress functional gains from skilled physical therapy services.  Goal status: Ongoing 08/21/2022   3. Patient will demonstrate FOTO outcome > or = 57 % to indicate reduced disability due to condition.  Goal status: OOngoing 08/21/2022   4.  Patient will demonstrate right knee MMT >/= 4/5 throughout to faciltiate usual transfers, stairs, squatting at [facility] for daily life.   Goal status: Ongoing 08/21/2022   5.  Patient AROM right knee ext 0* and flexion 100* Goal status: Ongoing 08/21/2022   6.  patient amb >500' & neg ramp/curb with LRAD modified independent.  Goal status: Ongoing 08/21/2022    PLAN:  PT FREQUENCY:  2x/week  PT DURATION: 12 weeks  PLANNED INTERVENTIONS: Therapeutic exercises, Therapeutic activity, Neuro Muscular re-education, Balance training, Gait training, Patient/Family education, Joint mobilization, Stair training, DME instructions, Dry Needling, Electrical stimulation, Traction, Cryotherapy, vasopneumatic deviceMoist heat, Taping, Ultrasound, Ionotophoresis 4mg /ml Dexamethasone, and aquatic therapy, Manual therapy.  All included unless contraindicated  PLAN FOR NEXT SESSION:  continue work towards LTGs, continue with exercises for standing for strength, balance & functional activities, vaso for edema prn.   Vladimir Faster, PT, DPT 08/23/2022, 1:44 PM

## 2022-08-28 ENCOUNTER — Encounter: Payer: Self-pay | Admitting: Physical Therapy

## 2022-08-28 ENCOUNTER — Ambulatory Visit (INDEPENDENT_AMBULATORY_CARE_PROVIDER_SITE_OTHER): Payer: Medicare Other | Admitting: Physical Therapy

## 2022-08-28 DIAGNOSIS — R2689 Other abnormalities of gait and mobility: Secondary | ICD-10-CM | POA: Diagnosis not present

## 2022-08-28 DIAGNOSIS — M25561 Pain in right knee: Secondary | ICD-10-CM | POA: Diagnosis not present

## 2022-08-28 DIAGNOSIS — R600 Localized edema: Secondary | ICD-10-CM

## 2022-08-28 DIAGNOSIS — M25661 Stiffness of right knee, not elsewhere classified: Secondary | ICD-10-CM | POA: Diagnosis not present

## 2022-08-28 DIAGNOSIS — M6281 Muscle weakness (generalized): Secondary | ICD-10-CM

## 2022-08-28 DIAGNOSIS — R2681 Unsteadiness on feet: Secondary | ICD-10-CM

## 2022-08-28 NOTE — Therapy (Signed)
OUTPATIENT PHYSICAL THERAPY LOWER EXTREMITY TREATMENT   Patient Name: [name] MRN: [id number] DOB:[birth year], 77 y.o., female Today's Date: 08/28/2022   END OF SESSION:  PT End of Session - 08/28/22 1305     Visit Number 20    Number of Visits 24    Date for PT Re-Evaluation 09/08/22    Authorization Type Medicare A&B and [employer] Fed emp    Progress Note Due on Visit 25    PT Start Time 1301    PT Stop Time 1342    PT Time Calculation (min) 41 min    Activity Tolerance Patient tolerated treatment well;Patient limited by pain;No increased pain    Behavior During Therapy [facility] for tasks assessed/performed                            Past Medical History:  Diagnosis Date   Anemia    Congenital musculoskeletal deformity of spine    Depression    Headache    Hypertension    Incontinence of bowel 09/2013   Lumbago    Lumbosacral spondylosis without myelopathy    Sleep apnea    Sleep disorder    uses gabapentin at bedtime    Spinal stenosis, lumbar region, without neurogenic claudication    Past Surgical History:  Procedure Laterality Date   ANKLE SURGERY Right 2015   lengthened the achilles    ANTERIOR HIP REVISION Right 10/07/2020   Procedure: RIGHT HIP REVISION OF  HIP BALL/POLY-LINER;  Surgeon: Kathryne Hitch, MD;  Location: [facility] OR;  Service: Orthopedics;  Laterality: Right;  RNFA PLEASE   AUGMENTATION MAMMAPLASTY Bilateral 2000 or 2001 unsure    Left Implant has busted and is no longer visible   BACK SURGERY  2015   fusion , Dr Noel Gerold at [facility]; has 2 metal rods in place , fusion from t2 to s1    BACK SURGERY  01/2017   Dr Sharolyn Douglas ; repair of cracked titanium rod in back lumbar    BREAST EXCISIONAL BIOPSY Left    30 yrs ago   bunionectomy  Bilateral 1980s   multiple    COLONOSCOPY     with polypectomy ; q 5 years    FOOT SURGERY  07/2011   SHOULDER SURGERY Left 2013   rotator cuff    TOTAL HIP ARTHROPLASTY Right  05/08/2017   Procedure: RIGHT TOTAL HIP ARTHROPLASTY ANTERIOR APPROACH;  Surgeon: Durene Romans, MD;  Location: [facility];  Service: Orthopedics;  Laterality: Right;  70 mins   TOTAL KNEE ARTHROPLASTY Right 06/06/2022   Procedure: RIGHT TOTAL KNEE ARTHROPLASTY;  Surgeon: Kathryne Hitch, MD;  Location: [facility] OR;  Service: Orthopedics;  Laterality: Right;   Patient Active Problem List   Diagnosis Date Noted   Status post total right knee replacement 06/06/2022   Leg length discrepancy, Right 10/07/2020   History of revision of total replacement of right hip joint 10/07/2020   Multinodular goiter 05/18/2020   Hip contracture, unspecified laterality 07/02/2018   Overweight (BMI 25.0-29.9) 05/09/2017   S/P right THA, AA 05/08/2017   Chronic bilateral low back pain without sciatica 04/05/2015   Disorder of sacroiliac joint 12/22/2014   Chronic low back pain 12/22/2014   Acquired scoliosis 11/27/2011   Lumbosacral spondylosis without myelopathy 05/22/2011    PCP: Jarrett Soho, MD  REFERRING PROVIDER: Kathryne Hitch, MD  REFERRING DIAG:  [phone] (ICD-10-CM) - Status post total right knee replacement  M17.11 (ICD-10-CM) - Unilateral primary osteoarthritis, right knee   THERAPY DIAG:  Acute pain of right knee  Stiffness of right knee, not elsewhere classified  Other abnormalities of gait and mobility  Muscle weakness (generalized)  Localized edema  Unsteadiness on feet  Rationale for Evaluation and Treatment: Rehabilitation  ONSET DATE: 06/06/2022 TKA  SUBJECTIVE:   SUBJECTIVE STATEMENT: She added another 1/8" to lift for 1/4" and it feels good.  She has been going to gym using pool and weight machines.    PERTINENT HISTORY: Right TKA 06/06/2022, OA, leg length discrepancy, right Total hip replacement anterior approach with revision 2022, hip contracture, chronic LBP with surgery, acquired scoliosis, HTN, ankle surgery lengthen Achilles,   PAIN:  NPRS scale:   today 2/10 and since last PT 0-4/10 Pain location: right knee along joint line Pain description: at rest dull ache and intermittent spasm & sharp pain Aggravating factors: up too long, first moving if knee has gotten stiffen Relieving factors: Oxy, muscle relax & ice  PRECAUTIONS: Fall  WEIGHT BEARING RESTRICTIONS: No  FALLS:  Has patient fallen in last 6 months? No  LIVING ENVIRONMENT: Lives with: lives with their spouse and 2 dogs 12-14# Lives in: [address] house with master bedroom & bathroom main level Stairs: Yes: Internal: 16-18 steps; on left going up and External: 3 steps; none Has following equipment at home: Single point cane, Environmental consultant - 2 wheeled, Marine scientist  OCCUPATION: retired from Engineer, [employer] (consulting),   PLOF: Independent  PATIENT GOALS:  walk & be active throughout her day, go upstairs to sewing room, sewing machine uses RLE   OBJECTIVE:  DIAGNOSTIC FINDINGS: 06/06/2022 post op X-ray showed right knee arthroplasty without immediate postoperative complication.  PATIENT SURVEYS:  07/12/2022: visit 8 FOTO 50% 06/21/2022:   FOTO intake:  29%  predicted:  57%  COGNITION: Overall cognitive status: WFL    SENSATION: WFL  EDEMA:  06/21/2022:   Circumferential:  LLE: above knee 43cm,  around knee 39.5cm, below knee 35 cm RLE: above knee 45.7cm,  around knee 40.8cm, below knee 38.9 cm  POSTURE: 06/21/2022:   rounded shoulders, forward head, flexed trunk , and weight shift left  PALPATION: 06/21/2022:   Right knee tenderness along joint line, right ITB tightness & tenderness  LOWER EXTREMITY ROM:   ROM Right eval Right 06/23/22 Right 06/28/22 Right 07/05/22 Right 07/12/22 Right 07/19/22 Right 07/24/22 Right 08/02/22 Right 08/07/22  Knee flexion Seated A:84* P: 93* Active 98 Seated A: 100* P: 104* Supine P: 116* Seated A: 111*  Seated P: 120* A: 114*  Seated P: 123* A: 119*  Knee extension Supine P: -7* Active -4 Standing A: -4* Supine P: -1* Seated A: LAQ  -9* Standing A:-2* Seated A: -5* Seated LAQ A: -2* Seated LAQ A: -1* Seated LAQ A: 0*   (Blank rows = not tested)  LOWER EXTREMITY MMT:  MMT Right eval Left 08/07/22 Right 08/07/22  Knee flexion 3-/5    Knee extension 3-/5 HH dynameter 71# & 78.2# HH dynameter 45.7# & 44.1# RLE:LLE 60%   (Blank rows = not tested)   FUNCTIONAL TESTS:  08/14/2022: pt stands with right side pelvis ~7/16" lower.  06/21/2022:   22 inch chair transfer: requires use of BUEs on armrest, RLE extended with minimal assistance and requires RW for stabilization  GAIT: 06/21/2022:   Distance walked: 100' Assistive device utilized: Environmental consultant - 2 wheeled Level of assistance: SBA Comments: RLE decreased stance duration, abduction, right knee flexed in stance with minimal increase  in flexion for swing.    TODAY'S TREATMENT                                                                          DATE: 08/28/2022: Therapeutic Exercise: Precor recumbent bike seat 6 level 4 full revolutions for knee range 8 min Leg press - PT demo & verbal cues on aligning feet, hips & knees - BLEs 125# 15 reps 1 set; marching 87# 10 reps 2 sets (1st set LLE leads & 2nd set RLE leads); single leg press 62# 10 reps 1 set ea LE. Sidestepping on foam beam including transition on/off with walking stick RUE 3 laps Braiding 1st lap BUE support, 2nd lap RUE, 3rd lap LUE, 4th lap no UE Marching forward & marching backward with walking stick RUE 3 laps Slider to 3 cones (ant-lat, lat & post-lat) with stance LE flexion on outward motion and ext inward motion with ipsilateral UE support. 5 reps ea LE. Squat lift with 10# kettle bell 10 reps & 15# 10 reps    08/23/2022: Therapeutic Exercise: Precor recumbent bike seat 6 level 4 full revolutions for knee range 8 min Stepping over 6" hurdles with 2 steps bw 6 hurdles leading RLE 3 reps & LLE 3 reps with walking stick support & PT supervision to CGA.  Picking up hurdles with squat with  supervision Slider to 3 cones (ant-lat, lat & post-lat) with stance LE flexion on outward motion and ext inward motion with ipsilateral UE support. 5 reps ea LE. Knee flexion machine BLEs 35# 10 reps;  single leg 15# 10 reps ea LE Knee ext machine BLEs 20# 10 reps; concentric BLEs and isometric / eccentric  single LE 10# 10 reps with BLEs; eccentric single LE with BLEs 5# 10 reps ( RLE concentric, isometric with LLE assist for last few degrees) Supine SLR 10 reps 2 sets with BLEs   Modalities Vaso to right knee 34* medium compression 10 min with LE elevation.  08/16/2022: Therapeutic Exercise: Precor recumbent bike seat 6 level 4 full revolutions for knee range 8 min Step up & over BOSU round side up with single UE support, 2 step approach 10 reps with BLEs. PT demo & verbal cues on technique.  Lateral step up BOSU round side up with BUE support, reaching contralateral LE across in front & in back 10 reps with ea LE.  Seated hamstring curl blue theraband on BLEs10 reps Seated LAQ 5# 15 reps alternating LEs BLEs Knee flexion machine BLEs 35# 10 reps;  single leg 15# 10 reps ea LE Knee ext machine BLEs 20# 5 reps; concentric BLEs and isometric / eccentric RLE 10# 10 reps;  RLE concentric, isometric with LLE assist for last few degrees, and eccentric RLE only 5# 10 reps   Modalities Vaso to right knee 34* medium compression 10 min with LE elevation.     HOME EXERCISE PROGRAM: Access Code: [email or web address] URL: [email or web address] Date: 07/17/2022 Prepared by: Vladimir Faster  Exercises - Ankle Alphabet in Elevation  - 2-4 x daily - 7 x weekly - 1 sets - 1 reps - supine quad set with towel roll under ankle  - 2-3 x daily - 7 x weekly - 2-3 sets - 10 reps -  5 seconds hold - Supine Heel Slide with Strap  - 2-3 x daily - 7 x weekly - 2-3 sets - 10 reps - 5 seconds hold - Supine Knee Extension Strengthening  - 2-3 x daily - 7 x weekly - 2-3 sets - 10 reps - 5 seconds hold - Supine  Straight Leg Raises  - 2-3 x daily - 7 x weekly - 2-3 sets - 10 reps - 5 seconds hold - Seated Knee Flexion Extension AROM   - 2-4 x daily - 7 x weekly - 2-3 sets - 10 reps - 5 seconds hold - Seated straight leg lifts  - 2-3 x daily - 7 x weekly - 2-3 sets - 10 reps - 5 seconds hold - Seated Hamstring Stretch with Strap  - 2-4 x daily - 7 x weekly - 1 sets - 3 reps - 20-30 seconds hold - Seated Long Arc Quad  - 2-4 x daily - 7 x weekly - 2-3 sets - 10 reps - 5 seconds hold - Seated Knee Flexion AAROM  - 3 x daily - 7 x weekly - 1 sets - 10 reps - 10 seconds hold - Seated Table Hamstring Stretch  - 2 x daily - 7 x weekly - 1 sets - 3 reps - 30 seconds hold - Supine Quadriceps Stretch with Strap on Table  - 2 x daily - 7 x weekly - 1 sets - 3 reps - 30 seconds hold - standing calf stretch with forefoot on small step or brick  - 2 x daily - 7 x weekly - 1 sets - 3 reps - 30 seconds hold - Supine ITB Stretch with Strap  - 1 x daily - 7 x weekly - 1 sets - 3 reps - 30 seconds hold - squat over chair like "dirty toilet"  - 1 x daily - 7 x weekly - 2-3 sets - 10 reps - 5 seconds hold - Seated Knee Extension Stretch with Chair  - 1-3 x daily - 7 x weekly - 1 sets - 2-3 reps - 10-20 seconds hold - standing knee extension  - 1 x daily - 7 x weekly - 2 sets - 10 reps - 5 seconds hold  Aquatic HEP Access Code: [email or web address] URL: [email or web address] Date: 08/11/2022 Prepared by: Ivery Quale  Exercises - Forward Walking  - 1 x daily - 2 x weekly - 4 sets - Side Stepping  - 2 x daily - 2 x weekly - 4 sets - 10 reps - Backward Walking  - 1 x daily - 2 x weekly - 4 sets - Standing March at [facility]  - 1 x daily - 2 x weekly - 15 reps - Standing Hip Flexion Extension at [employer]  - 1 x daily - 2 x weekly - 15 reps - Standing Hip Abduction Adduction at [employer]  - 1 x daily - 2 x weekly - 20 reps - Standing Knee Flexion  - 2 x daily - 6 x weekly - 3 sets - 10 reps - Squat  - 1 x daily - 2 x  weekly - 15 reps - Lunge to [employer] at [employer]  - 1 x daily - 2 x weekly - 1 sets - 20 reps - Forward Monster Walk  - 1 x daily - 2 x weekly - 4 sets - Standing Single Leg Hip Circles  - 1 x daily - 2 x weekly - 1 sets - 20 reps - Standing Lumbar Spine  Flexion Stretch Counter  - 2 x daily - 6 x weekly - 1 sets - 3 reps - 10 hold  ASSESSMENT: CLINICAL IMPRESSION: PT worked on functional strengthening including some balance components today which fatigued muscles working in new pattern.   OBJECTIVE IMPAIRMENTS: Abnormal gait, decreased activity tolerance, decreased balance, decreased endurance, decreased knowledge of condition, decreased knowledge of use of DME, decreased mobility, difficulty walking, decreased ROM, decreased strength, increased edema, increased muscle spasms, postural dysfunction, and pain.   ACTIVITY LIMITATIONS: carrying, lifting, bending, sitting, standing, squatting, sleeping, stairs, transfers, bed mobility, and locomotion level  PARTICIPATION LIMITATIONS: meal prep, cleaning, laundry, driving, and community activity  PERSONAL FACTORS: Age, Fitness, Past/current experiences, Time since onset of injury/illness/exacerbation, and 3+ comorbidities: see PMH  are also affecting patient's functional outcome.   REHAB POTENTIAL: Good  CLINICAL DECISION MAKING: Stable/uncomplicated  EVALUATION COMPLEXITY: Low   GOALS: Goals reviewed with patient? Yes  SHORT TERM GOALS: (target date for Short term goals 07/21/2022)   1.  Patient will demonstrate independent use of home exercise program to maintain progress from in clinic treatments. Goal status: MET 07/17/2022  2. PROM right knee ext -2* and flexion 100* Goal status:  MET 07/05/2022  3. Interim FOTO >/= 40%  Goal status: MET 07/12/2022  LONG TERM GOALS: (target dates for all long term goals  09/08/2022 )   1. Patient will demonstrate/report pain at worst less than or equal to 2/10 to facilitate minimal limitation in daily  activity secondary to pain symptoms.  Goal status: Ongoing 08/21/2022   2. Patient will demonstrate independent use of home exercise program to facilitate ability to maintain/progress functional gains from skilled physical therapy services.  Goal status: Ongoing 08/21/2022   3. Patient will demonstrate FOTO outcome > or = 57 % to indicate reduced disability due to condition.  Goal status: OOngoing 08/21/2022   4.  Patient will demonstrate right knee MMT >/= 4/5 throughout to faciltiate usual transfers, stairs, squatting at [facility] for daily life.   Goal status: Ongoing 08/21/2022   5.  Patient AROM right knee ext 0* and flexion 100* Goal status: Ongoing 08/21/2022   6.  patient amb >500' & neg ramp/curb with LRAD modified independent.  Goal status: Ongoing 08/21/2022    PLAN:  PT FREQUENCY:  2x/week  PT DURATION: 12 weeks  PLANNED INTERVENTIONS: Therapeutic exercises, Therapeutic activity, Neuro Muscular re-education, Balance training, Gait training, Patient/Family education, Joint mobilization, Stair training, DME instructions, Dry Needling, Electrical stimulation, Traction, Cryotherapy, vasopneumatic deviceMoist heat, Taping, Ultrasound, Ionotophoresis 4mg /ml Dexamethasone, and aquatic therapy, Manual therapy.  All included unless contraindicated  PLAN FOR NEXT SESSION:  work towards LTGs with discharge planned next week, continue with exercises for standing for strength, balance & functional activities, vaso for edema prn.    Vladimir Faster, PT, DPT 08/28/2022, 2:02 PM

## 2022-08-30 ENCOUNTER — Encounter: Payer: Medicare Other | Admitting: Physical Therapy

## 2022-09-04 ENCOUNTER — Encounter: Payer: Self-pay | Admitting: Physical Therapy

## 2022-09-04 ENCOUNTER — Ambulatory Visit (INDEPENDENT_AMBULATORY_CARE_PROVIDER_SITE_OTHER): Payer: Medicare Other | Admitting: Physical Therapy

## 2022-09-04 DIAGNOSIS — M25661 Stiffness of right knee, not elsewhere classified: Secondary | ICD-10-CM

## 2022-09-04 DIAGNOSIS — R2681 Unsteadiness on feet: Secondary | ICD-10-CM

## 2022-09-04 DIAGNOSIS — M25561 Pain in right knee: Secondary | ICD-10-CM | POA: Diagnosis not present

## 2022-09-04 DIAGNOSIS — R2689 Other abnormalities of gait and mobility: Secondary | ICD-10-CM | POA: Diagnosis not present

## 2022-09-04 DIAGNOSIS — M6281 Muscle weakness (generalized): Secondary | ICD-10-CM

## 2022-09-04 DIAGNOSIS — R600 Localized edema: Secondary | ICD-10-CM | POA: Diagnosis not present

## 2022-09-04 NOTE — Therapy (Signed)
OUTPATIENT PHYSICAL THERAPY LOWER EXTREMITY TREATMENT   Patient Name: [name] MRN: [id number] DOB:May 22, 1945, 77 y.o., female Today's Date: 09/04/2022   END OF SESSION:  PT End of Session - 09/04/22 1301     Visit Number 21    Number of Visits 24    Date for PT Re-Evaluation 09/08/22    Authorization Type Medicare A&B and [employer] Fed emp    Progress Note Due on Visit 25    PT Start Time 1300    PT Stop Time 1341    PT Time Calculation (min) 41 min    Activity Tolerance Patient tolerated treatment well;Patient limited by pain;No increased pain    Behavior During Therapy [facility] for tasks assessed/performed                             Past Medical History:  Diagnosis Date   Anemia    Congenital musculoskeletal deformity of spine    Depression    Headache    Hypertension    Incontinence of bowel 09/2013   Lumbago    Lumbosacral spondylosis without myelopathy    Sleep apnea    Sleep disorder    uses gabapentin at bedtime    Spinal stenosis, lumbar region, without neurogenic claudication    Past Surgical History:  Procedure Laterality Date   ANKLE SURGERY Right 2015   lengthened the achilles    ANTERIOR HIP REVISION Right 10/07/2020   Procedure: RIGHT HIP REVISION OF  HIP BALL/POLY-LINER;  Surgeon: Kathryne Hitch, MD;  Location: [facility] OR;  Service: Orthopedics;  Laterality: Right;  RNFA PLEASE   AUGMENTATION MAMMAPLASTY Bilateral 2000 or 2001 unsure    Left Implant has busted and is no longer visible   BACK SURGERY  2015   fusion , Dr Noel Gerold at [facility]; has 2 metal rods in place , fusion from t2 to s1    BACK SURGERY  01/2017   Dr Sharolyn Douglas ; repair of cracked titanium rod in back lumbar    BREAST EXCISIONAL BIOPSY Left    30 yrs ago   bunionectomy  Bilateral 1980s   multiple    COLONOSCOPY     with polypectomy ; q 5 years    FOOT SURGERY  07/2011   SHOULDER SURGERY Left 2013   rotator cuff    TOTAL HIP ARTHROPLASTY Right  05/08/2017   Procedure: RIGHT TOTAL HIP ARTHROPLASTY ANTERIOR APPROACH;  Surgeon: Durene Romans, MD;  Location: [facility];  Service: Orthopedics;  Laterality: Right;  70 mins   TOTAL KNEE ARTHROPLASTY Right 06/06/2022   Procedure: RIGHT TOTAL KNEE ARTHROPLASTY;  Surgeon: Kathryne Hitch, MD;  Location: [facility] OR;  Service: Orthopedics;  Laterality: Right;   Patient Active Problem List   Diagnosis Date Noted   Status post total right knee replacement 06/06/2022   Leg length discrepancy, Right 10/07/2020   History of revision of total replacement of right hip joint 10/07/2020   Multinodular goiter 05/18/2020   Hip contracture, unspecified laterality 07/02/2018   Overweight (BMI 25.0-29.9) 05/09/2017   S/P right THA, AA 05/08/2017   Chronic bilateral low back pain without sciatica 04/05/2015   Disorder of sacroiliac joint 12/22/2014   Chronic low back pain 12/22/2014   Acquired scoliosis 11/27/2011   Lumbosacral spondylosis without myelopathy 05/22/2011    PCP: Jarrett Soho, MD  REFERRING PROVIDER: Kathryne Hitch, MD  REFERRING DIAG:  [phone] (ICD-10-CM) - Status post total right knee replacement  M17.11 (ICD-10-CM) - Unilateral primary osteoarthritis, right knee   THERAPY DIAG:  Acute pain of right knee  Stiffness of right knee, not elsewhere classified  Other abnormalities of gait and mobility  Muscle weakness (generalized)  Localized edema  Unsteadiness on feet  Rationale for Evaluation and Treatment: Rehabilitation  ONSET DATE: 06/06/2022 TKA  SUBJECTIVE:   SUBJECTIVE STATEMENT: The new exercises from last session caused muscle soreness.    PERTINENT HISTORY: Right TKA 06/06/2022, OA, leg length discrepancy, right Total hip replacement anterior approach with revision 2022, hip contracture, chronic LBP with surgery, acquired scoliosis, HTN, ankle surgery lengthen Achilles,   PAIN:  NPRS scale:  today 2/10 and since last PT 0-2/10 Pain location: right  knee along joint line Pain description: at rest dull ache and intermittent spasm & sharp pain Aggravating factors: up too long, first moving if knee has gotten stiffen Relieving factors: Oxy, muscle relax & ice  PRECAUTIONS: Fall  WEIGHT BEARING RESTRICTIONS: No  FALLS:  Has patient fallen in last 6 months? No  LIVING ENVIRONMENT: Lives with: lives with their spouse and 2 dogs 12-14# Lives in: [address] house with master bedroom & bathroom main level Stairs: Yes: Internal: 16-18 steps; on left going up and External: 3 steps; none Has following equipment at home: Single point cane, Environmental consultant - 2 wheeled, Marine scientist  OCCUPATION: retired from Engineer, [employer] (consulting),   PLOF: Independent  PATIENT GOALS:  walk & be active throughout her day, go upstairs to sewing room, sewing machine uses RLE   OBJECTIVE:  DIAGNOSTIC FINDINGS: 06/06/2022 post op X-ray showed right knee arthroplasty without immediate postoperative complication.  PATIENT SURVEYS:  07/12/2022: visit 8 FOTO 50% 06/21/2022:   FOTO intake:  29%  predicted:  57%  COGNITION: Overall cognitive status: WFL    SENSATION: WFL  EDEMA:  06/21/2022:   Circumferential:  LLE: above knee 43cm,  around knee 39.5cm, below knee 35 cm RLE: above knee 45.7cm,  around knee 40.8cm, below knee 38.9 cm  POSTURE: 06/21/2022:   rounded shoulders, forward head, flexed trunk , and weight shift left  PALPATION: 06/21/2022:   Right knee tenderness along joint line, right ITB tightness & tenderness  LOWER EXTREMITY ROM:   ROM Right eval Right 06/23/22 Right 06/28/22 Right 07/05/22 Right 07/12/22 Right 07/19/22 Right 07/24/22 Right 08/02/22 Right 08/07/22  Knee flexion Seated A:84* P: 93* Active 98 Seated A: 100* P: 104* Supine P: 116* Seated A: 111*  Seated P: 120* A: 114*  Seated P: 123* A: 119*  Knee extension Supine P: -7* Active -4 Standing A: -4* Supine P: -1* Seated A: LAQ -9* Standing A:-2* Seated A: -5* Seated LAQ A: -2*  Seated LAQ A: -1* Seated LAQ A: 0*   (Blank rows = not tested)  LOWER EXTREMITY MMT:  MMT Right eval Left 08/07/22 Right 08/07/22  Knee flexion 3-/5    Knee extension 3-/5 HH dynameter 71# & 78.2# HH dynameter 45.7# & 44.1# RLE:LLE 60%   (Blank rows = not tested)   FUNCTIONAL TESTS:  08/14/2022: pt stands with right side pelvis ~7/16" lower.  06/21/2022:   22 inch chair transfer: requires use of BUEs on armrest, RLE extended with minimal assistance and requires RW for stabilization  GAIT: 06/21/2022:   Distance walked: 100' Assistive device utilized: Environmental consultant - 2 wheeled Level of assistance: SBA Comments: RLE decreased stance duration, abduction, right knee flexed in stance with minimal increase in flexion for swing.    TODAY'S TREATMENT  DATE: 09/04/2022: Therapeutic Exercise: Precor recumbent bike seat 6 level 4 full revolutions for knee range 8 min Leg press - PT demo & verbal cues on aligning feet, hips & knees - BLEs 125# 15 reps 1 set; marching 87# 10 reps 2 sets (1st set LLE leads & 2nd set RLE leads); single leg press 62# 10 reps 1 set ea LE. Sidestepping on foam beam including transition on/off with walking stick RUE 3 laps Braiding 3 laps with walking stick RUE, 4th lap no UE Marching forward & marching backward with walking stick RUE 3 laps Squat lift with 5# kettle bell 10 reps & 10# 10 reps Standing alternating knee flexion 10 reps 2 sets (1st set walking stick RUE & wall vertical support LUE and 2nd set opposite UE support)  08/28/2022: Therapeutic Exercise: Precor recumbent bike seat 6 level 4 full revolutions for knee range 8 min Leg press - PT demo & verbal cues on aligning feet, hips & knees - BLEs 125# 15 reps 1 set; marching 87# 10 reps 2 sets (1st set LLE leads & 2nd set RLE leads); single leg press 62# 10 reps 1 set ea LE. Sidestepping on foam beam including transition on/off with walking  stick RUE 3 laps Braiding 1st lap BUE support, 2nd lap RUE, 3rd lap LUE, 4th lap no UE Marching forward & marching backward with walking stick RUE 3 laps Slider to 3 cones (ant-lat, lat & post-lat) with stance LE flexion on outward motion and ext inward motion with ipsilateral UE support. 5 reps ea LE. Squat lift with 10# kettle bell 10 reps & 15# 10 reps    08/23/2022: Therapeutic Exercise: Precor recumbent bike seat 6 level 4 full revolutions for knee range 8 min Stepping over 6" hurdles with 2 steps bw 6 hurdles leading RLE 3 reps & LLE 3 reps with walking stick support & PT supervision to CGA.  Picking up hurdles with squat with supervision Slider to 3 cones (ant-lat, lat & post-lat) with stance LE flexion on outward motion and ext inward motion with ipsilateral UE support. 5 reps ea LE. Knee flexion machine BLEs 35# 10 reps;  single leg 15# 10 reps ea LE Knee ext machine BLEs 20# 10 reps; concentric BLEs and isometric / eccentric  single LE 10# 10 reps with BLEs; eccentric single LE with BLEs 5# 10 reps ( RLE concentric, isometric with LLE assist for last few degrees) Supine SLR 10 reps 2 sets with BLEs   Modalities Vaso to right knee 34* medium compression 10 min with LE elevation.     HOME EXERCISE PROGRAM: Access Code: [email or web address] URL: [email or web address] Date: 07/17/2022 Prepared by: Vladimir Faster  Exercises - Ankle Alphabet in Elevation  - 2-4 x daily - 7 x weekly - 1 sets - 1 reps - supine quad set with towel roll under ankle  - 2-3 x daily - 7 x weekly - 2-3 sets - 10 reps - 5 seconds hold - Supine Heel Slide with Strap  - 2-3 x daily - 7 x weekly - 2-3 sets - 10 reps - 5 seconds hold - Supine Knee Extension Strengthening  - 2-3 x daily - 7 x weekly - 2-3 sets - 10 reps - 5 seconds hold - Supine Straight Leg Raises  - 2-3 x daily - 7 x weekly - 2-3 sets - 10 reps - 5 seconds hold - Seated Knee Flexion Extension AROM   - 2-4 x daily - 7 x weekly - 2-3 sets  -  10 reps - 5 seconds hold - Seated straight leg lifts  - 2-3 x daily - 7 x weekly - 2-3 sets - 10 reps - 5 seconds hold - Seated Hamstring Stretch with Strap  - 2-4 x daily - 7 x weekly - 1 sets - 3 reps - 20-30 seconds hold - Seated Long Arc Quad  - 2-4 x daily - 7 x weekly - 2-3 sets - 10 reps - 5 seconds hold - Seated Knee Flexion AAROM  - 3 x daily - 7 x weekly - 1 sets - 10 reps - 10 seconds hold - Seated Table Hamstring Stretch  - 2 x daily - 7 x weekly - 1 sets - 3 reps - 30 seconds hold - Supine Quadriceps Stretch with Strap on Table  - 2 x daily - 7 x weekly - 1 sets - 3 reps - 30 seconds hold - standing calf stretch with forefoot on small step or brick  - 2 x daily - 7 x weekly - 1 sets - 3 reps - 30 seconds hold - Supine ITB Stretch with Strap  - 1 x daily - 7 x weekly - 1 sets - 3 reps - 30 seconds hold - squat over chair like "dirty toilet"  - 1 x daily - 7 x weekly - 2-3 sets - 10 reps - 5 seconds hold - Seated Knee Extension Stretch with Chair  - 1-3 x daily - 7 x weekly - 1 sets - 2-3 reps - 10-20 seconds hold - standing knee extension  - 1 x daily - 7 x weekly - 2 sets - 10 reps - 5 seconds hold  Aquatic HEP Access Code: [email or web address] URL: [email or web address] Date: 08/11/2022 Prepared by: Ivery Quale  Exercises - Forward Walking  - 1 x daily - 2 x weekly - 4 sets - Side Stepping  - 2 x daily - 2 x weekly - 4 sets - 10 reps - Backward Walking  - 1 x daily - 2 x weekly - 4 sets - Standing March at [facility]  - 1 x daily - 2 x weekly - 15 reps - Standing Hip Flexion Extension at [employer]  - 1 x daily - 2 x weekly - 15 reps - Standing Hip Abduction Adduction at [employer]  - 1 x daily - 2 x weekly - 20 reps - Standing Knee Flexion  - 2 x daily - 6 x weekly - 3 sets - 10 reps - Squat  - 1 x daily - 2 x weekly - 15 reps - Lunge to Target at [employer]  - 1 x daily - 2 x weekly - 1 sets - 20 reps - Forward Monster Walk  - 1 x daily - 2 x weekly - 4 sets - Standing  Single Leg Hip Circles  - 1 x daily - 2 x weekly - 1 sets - 20 reps - Standing Lumbar Spine Flexion Stretch Counter  - 2 x daily - 6 x weekly - 1 sets - 3 reps - 10 hold  ASSESSMENT: CLINICAL IMPRESSION: PT worked on standing functional strength and balance which she tolerated.  She appears on target to meet LTGs next week.  OBJECTIVE IMPAIRMENTS: Abnormal gait, decreased activity tolerance, decreased balance, decreased endurance, decreased knowledge of condition, decreased knowledge of use of DME, decreased mobility, difficulty walking, decreased ROM, decreased strength, increased edema, increased muscle spasms, postural dysfunction, and pain.   ACTIVITY LIMITATIONS: carrying, lifting, bending, sitting, standing, squatting, sleeping, stairs,  transfers, bed mobility, and locomotion level  PARTICIPATION LIMITATIONS: meal prep, cleaning, laundry, driving, and community activity  PERSONAL FACTORS: Age, Fitness, Past/current experiences, Time since onset of injury/illness/exacerbation, and 3+ comorbidities: see PMH  are also affecting patient's functional outcome.   REHAB POTENTIAL: Good  CLINICAL DECISION MAKING: Stable/uncomplicated  EVALUATION COMPLEXITY: Low   GOALS: Goals reviewed with patient? Yes  SHORT TERM GOALS: (target date for Short term goals 07/21/2022)   1.  Patient will demonstrate independent use of home exercise program to maintain progress from in clinic treatments. Goal status: MET 07/17/2022  2. PROM right knee ext -2* and flexion 100* Goal status:  MET 07/05/2022  3. Interim FOTO >/= 40%  Goal status: MET 07/12/2022  LONG TERM GOALS: (target dates for all long term goals  09/08/2022 )   1. Patient will demonstrate/report pain at worst less than or equal to 2/10 to facilitate minimal limitation in daily activity secondary to pain symptoms.  Goal status: Ongoing 08/30/2022   2. Patient will demonstrate independent use of home exercise program to facilitate ability to  maintain/progress functional gains from skilled physical therapy services.  Goal status: Ongoing 08/30/2022   3. Patient will demonstrate FOTO outcome > or = 57 % to indicate reduced disability due to condition.  Goal status: Ongoing 08/30/2022   4.  Patient will demonstrate right knee MMT >/= 4/5 throughout to faciltiate usual transfers, stairs, squatting at [facility] for daily life.   Goal status: Ongoing 08/30/2022   5.  Patient AROM right knee ext 0* and flexion 100* Goal status: Ongoing 08/30/2022   6.  patient amb >500' & neg ramp/curb with LRAD modified independent.  Goal status: Ongoing 08/30/2022    PLAN:  PT FREQUENCY:  2x/week  PT DURATION: 12 weeks  PLANNED INTERVENTIONS: Therapeutic exercises, Therapeutic activity, Neuro Muscular re-education, Balance training, Gait training, Patient/Family education, Joint mobilization, Stair training, DME instructions, Dry Needling, Electrical stimulation, Traction, Cryotherapy, vasopneumatic deviceMoist heat, Taping, Ultrasound, Ionotophoresis 4mg /ml Dexamethasone, and aquatic therapy, Manual therapy.  All included unless contraindicated  PLAN FOR NEXT SESSION:    check LTGs and discharge   Vladimir Faster, PT, DPT 09/04/2022, 1:43 PM

## 2022-09-05 DIAGNOSIS — F419 Anxiety disorder, unspecified: Secondary | ICD-10-CM | POA: Diagnosis not present

## 2022-09-05 DIAGNOSIS — F330 Major depressive disorder, recurrent, mild: Secondary | ICD-10-CM | POA: Diagnosis not present

## 2022-09-05 DIAGNOSIS — G4700 Insomnia, unspecified: Secondary | ICD-10-CM | POA: Diagnosis not present

## 2022-09-06 ENCOUNTER — Encounter: Payer: Self-pay | Admitting: Physical Therapy

## 2022-09-06 ENCOUNTER — Ambulatory Visit (INDEPENDENT_AMBULATORY_CARE_PROVIDER_SITE_OTHER): Payer: Medicare Other | Admitting: Physical Therapy

## 2022-09-06 DIAGNOSIS — M6281 Muscle weakness (generalized): Secondary | ICD-10-CM

## 2022-09-06 DIAGNOSIS — R600 Localized edema: Secondary | ICD-10-CM | POA: Diagnosis not present

## 2022-09-06 DIAGNOSIS — R2689 Other abnormalities of gait and mobility: Secondary | ICD-10-CM | POA: Diagnosis not present

## 2022-09-06 DIAGNOSIS — M25561 Pain in right knee: Secondary | ICD-10-CM | POA: Diagnosis not present

## 2022-09-06 DIAGNOSIS — R2681 Unsteadiness on feet: Secondary | ICD-10-CM | POA: Diagnosis not present

## 2022-09-06 DIAGNOSIS — M25661 Stiffness of right knee, not elsewhere classified: Secondary | ICD-10-CM

## 2022-09-06 NOTE — Therapy (Signed)
OUTPATIENT PHYSICAL THERAPY LOWER EXTREMITY TREATMENT & DISCHARGE SUMMARY   Patient Name: ERIAL ANGERMEIER MRN: [id number] DOB:1945-10-21, 77 y.o., female Today's Date: 09/06/2022  PHYSICAL THERAPY DISCHARGE SUMMARY  Visits from Start of Care: 21  Current functional level related to goals / functional outcomes: See below   Remaining deficits: See impression statement below   Education / Equipment: Patient was educated in ongoing fitness plan / HEP which she appears to understand.    Patient agrees to discharge. Patient goals were met. Patient is being discharged due to meeting the stated rehab goals.   END OF SESSION:  PT End of Session - 09/06/22 1300     Visit Number 22    Number of Visits 24    Date for PT Re-Evaluation 09/08/22    Authorization Type Medicare A&B and BCBS Fed emp    Progress Note Due on Visit --    PT Start Time 1300    PT Stop Time 1328    PT Time Calculation (min) 28 min    Activity Tolerance Patient tolerated treatment well;No increased pain    Behavior During Therapy [facility] for tasks assessed/performed                              Past Medical History:  Diagnosis Date   Anemia    Congenital musculoskeletal deformity of spine    Depression    Headache    Hypertension    Incontinence of bowel 09/2013   Lumbago    Lumbosacral spondylosis without myelopathy    Sleep apnea    Sleep disorder    uses gabapentin at bedtime    Spinal stenosis, lumbar region, without neurogenic claudication    Past Surgical History:  Procedure Laterality Date   ANKLE SURGERY Right 2015   lengthened the achilles    ANTERIOR HIP REVISION Right 10/07/2020   Procedure: RIGHT HIP REVISION OF  HIP BALL/POLY-LINER;  Surgeon: Kathryne Hitch, MD;  Location: [facility] OR;  Service: Orthopedics;  Laterality: Right;  RNFA PLEASE   AUGMENTATION MAMMAPLASTY Bilateral 2000 or 2001 unsure    Left Implant has busted and is no longer visible   BACK SURGERY   2015   fusion , Dr Noel Gerold at [facility]; has 2 metal rods in place , fusion from t2 to s1    BACK SURGERY  01/2017   Dr Sharolyn Douglas ; repair of cracked titanium rod in back lumbar    BREAST EXCISIONAL BIOPSY Left    30 yrs ago   bunionectomy  Bilateral 1980s   multiple    COLONOSCOPY     with polypectomy ; q 5 years    FOOT SURGERY  07/2011   SHOULDER SURGERY Left 2013   rotator cuff    TOTAL HIP ARTHROPLASTY Right 05/08/2017   Procedure: RIGHT TOTAL HIP ARTHROPLASTY ANTERIOR APPROACH;  Surgeon: Durene Romans, MD;  Location: [facility];  Service: Orthopedics;  Laterality: Right;  70 mins   TOTAL KNEE ARTHROPLASTY Right 06/06/2022   Procedure: RIGHT TOTAL KNEE ARTHROPLASTY;  Surgeon: Kathryne Hitch, MD;  Location: [facility] OR;  Service: Orthopedics;  Laterality: Right;   Patient Active Problem List   Diagnosis Date Noted   Status post total right knee replacement 06/06/2022   Leg length discrepancy, Right 10/07/2020   History of revision of total replacement of right hip joint 10/07/2020   Multinodular goiter 05/18/2020   Hip contracture, unspecified laterality 07/02/2018  Overweight (BMI 25.0-29.9) 05/09/2017   S/P right THA, AA 05/08/2017   Chronic bilateral low back pain without sciatica 04/05/2015   Disorder of sacroiliac joint 12/22/2014   Chronic low back pain 12/22/2014   Acquired scoliosis 11/27/2011   Lumbosacral spondylosis without myelopathy 05/22/2011    PCP: Jarrett Soho, MD  REFERRING PROVIDER: Kathryne Hitch, MD  REFERRING DIAG:  [id number] (ICD-10-CM) - Status post total right knee replacement  M17.11 (ICD-10-CM) - Unilateral primary osteoarthritis, right knee   THERAPY DIAG:  Acute pain of right knee  Stiffness of right knee, not elsewhere classified  Other abnormalities of gait and mobility  Muscle weakness (generalized)  Localized edema  Unsteadiness on feet  Rationale for Evaluation and Treatment: Rehabilitation  ONSET DATE:  06/06/2022 TKA  SUBJECTIVE:   SUBJECTIVE STATEMENT: She feels comfortable with exercises.    PERTINENT HISTORY: Right TKA 06/06/2022, OA, leg length discrepancy, right Total hip replacement anterior approach with revision 2022, hip contracture, chronic LBP with surgery, acquired scoliosis, HTN, ankle surgery lengthen Achilles,   PAIN:  NPRS scale:  today  2/10 and since last PT 0-2/10 Pain location: right knee along joint line Pain description: at rest dull ache and intermittent spasm & sharp pain Aggravating factors: up too long, first moving if knee has gotten stiffen Relieving factors: Oxy, muscle relax & ice  PRECAUTIONS: Fall  WEIGHT BEARING RESTRICTIONS: No  FALLS:  Has patient fallen in last 6 months? No  LIVING ENVIRONMENT: Lives with: lives with their spouse and 2 dogs 12-14# Lives in: [address] house with master bedroom & bathroom main level Stairs: Yes: Internal: 16-18 steps; on left going up and External: 3 steps; none Has following equipment at home: Single point cane, Environmental consultant - 2 wheeled, Marine scientist  OCCUPATION: retired from Engineer, [employer] (consulting),   PLOF: Independent  PATIENT GOALS:  walk & be active throughout her day, go upstairs to sewing room, sewing machine uses RLE   OBJECTIVE:  DIAGNOSTIC FINDINGS: 06/06/2022 post op X-ray showed right knee arthroplasty without immediate postoperative complication.  PATIENT SURVEYS:  09/06/2022:  visit 22 FOTO 59% 07/12/2022: visit 8 FOTO 50% 06/21/2022:   FOTO intake:  29%  predicted:  57%  COGNITION: Overall cognitive status: WFL    SENSATION: WFL  EDEMA:  06/21/2022:   Circumferential:  LLE: above knee 43cm,  around knee 39.5cm, below knee 35 cm RLE: above knee 45.7cm,  around knee 40.8cm, below knee 38.9 cm  POSTURE: 06/21/2022:   rounded shoulders, forward head, flexed trunk , and weight shift left  PALPATION: 06/21/2022:   Right knee tenderness along joint line, right ITB tightness & tenderness  LOWER  EXTREMITY ROM:   ROM Right eval Right 06/23/22 Right 06/28/22 Right 07/05/22 Right 07/12/22 Right 07/19/22 Right 07/24/22 Right 08/02/22 Right 08/07/22 Right 09/06/22  Knee flexion Seated A:84* P: 93* Active 98 Seated A: 100* P: 104* Supine P: 116* Seated A: 111*  Seated P: 120* A: 114*  Seated P: 123* A: 119* Supine A: 119* P: 122*  Knee extension Supine P: -7* Active -4 Standing A: -4* Supine P: -1* Seated A: LAQ -9* Standing A:-2* Seated A: -5* Seated LAQ A: -2* Seated LAQ A: -1* Seated LAQ A: 0* Seated LAQ A: 0*   (Blank rows = not tested)  LOWER EXTREMITY MMT:  MMT Right eval Left 08/07/22 Right 08/07/22 Right 09/06/2022  Knee flexion 3-/5     Knee extension 3-/5 HH dynameter 71# & 78.2# HH dynameter 45.7# & 44.1# RLE:LLE 60% HH  dynameter 57.4# & 56.5# RLE:LLE 76%   (Blank rows = not tested)   FUNCTIONAL TESTS:  08/14/2022: pt stands with right side pelvis ~7/16" lower.  06/21/2022:   22 inch chair transfer: requires use of BUEs on armrest, RLE extended with minimal assistance and requires RW for stabilization  GAIT: 09/06/2022: pt amb >500' with walking stick and neg ramp & curb modified independent.   06/21/2022:   Distance walked: 100' Assistive device utilized: Environmental consultant - 2 wheeled Level of assistance: SBA Comments: RLE decreased stance duration, abduction, right knee flexed in stance with minimal increase in flexion for swing.    TODAY'S TREATMENT                                                                          DATE: 09/06/2022: See objective data & LTGs.  Therapeutic Exercise: Precor recumbent bike seat 6 level 4 full revolutions for knee range 8 min PT updated HEP with HO and verbal review.    09/04/2022: Therapeutic Exercise: Precor recumbent bike seat 6 level 4 full revolutions for knee range 8 min Leg press - PT demo & verbal cues on aligning feet, hips & knees - BLEs 125# 15 reps 1 set; marching 87# 10 reps 2 sets (1st set LLE leads &  2nd set RLE leads); single leg press 62# 10 reps 1 set ea LE. Sidestepping on foam beam including transition on/off with walking stick RUE 3 laps Braiding 3 laps with walking stick RUE, 4th lap no UE Marching forward & marching backward with walking stick RUE 3 laps Squat lift with 5# kettle bell 10 reps & 10# 10 reps Standing alternating knee flexion 10 reps 2 sets (1st set walking stick RUE & wall vertical support LUE and 2nd set opposite UE support)  08/28/2022: Therapeutic Exercise: Precor recumbent bike seat 6 level 4 full revolutions for knee range 8 min Leg press - PT demo & verbal cues on aligning feet, hips & knees - BLEs 125# 15 reps 1 set; marching 87# 10 reps 2 sets (1st set LLE leads & 2nd set RLE leads); single leg press 62# 10 reps 1 set ea LE. Sidestepping on foam beam including transition on/off with walking stick RUE 3 laps Braiding 1st lap BUE support, 2nd lap RUE, 3rd lap LUE, 4th lap no UE Marching forward & marching backward with walking stick RUE 3 laps Slider to 3 cones (ant-lat, lat & post-lat) with stance LE flexion on outward motion and ext inward motion with ipsilateral UE support. 5 reps ea LE. Squat lift with 10# kettle bell 10 reps & 15# 10 reps       HOME EXERCISE PROGRAM: Access Code: [email or web address] URL: [email or web address] Date: 09/06/2022 Prepared by: Vladimir Faster  Exercises - Ankle Alphabet in Elevation  - 2-4 x daily - 7 x weekly - 1 sets - 1 reps - Seated Knee Flexion Extension AROM   - 2-4 x daily - 7 x weekly - 2-3 sets - 10 reps - 5 seconds hold - Seated straight leg lifts  - 2-3 x daily - 7 x weekly - 2-3 sets - 10 reps - 5 seconds hold - Seated Hamstring Stretch with Strap  - 2-4 x daily - 7 x  weekly - 1 sets - 3 reps - 20-30 seconds hold - Seated Table Hamstring Stretch  - 2 x daily - 7 x weekly - 1 sets - 3 reps - 30 seconds hold - Seated Long Arc Quad  - 2-4 x daily - 7 x weekly - 2-3 sets - 10 reps - 5 seconds hold - Supine  Quadriceps Stretch with Strap on Table  - 2 x daily - 7 x weekly - 1 sets - 3 reps - 30 seconds hold - standing calf stretch with forefoot on small step or brick  - 2 x daily - 7 x weekly - 1 sets - 3 reps - 30 seconds hold - Supine ITB Stretch with Strap  - 1 x daily - 7 x weekly - 1 sets - 3 reps - 30 seconds hold - squat over chair like "dirty toilet"  - 1 x daily - 7 x weekly - 2-3 sets - 10 reps - 5 seconds hold - standing knee extension  - 1 x daily - 7 x weekly - 2 sets - 10 reps - 5 seconds hold - Braided Sidestepping  - 1 x daily - 5 x weekly - 3 sets - 10 reps - Tandem Stance with Support  - 1 x daily - 5 x weekly - 3 sets - 10 reps - Marching forward & backward  - 1 x daily - 5 x weekly - 1 sets - 10 reps  Aquatic HEP Access Code: [email or web address] URL: [email or web address] Date: 08/11/2022 Prepared by: Ivery Quale  Exercises - Forward Walking  - 1 x daily - 2 x weekly - 4 sets - Side Stepping  - 2 x daily - 2 x weekly - 4 sets - 10 reps - Backward Walking  - 1 x daily - 2 x weekly - 4 sets - Standing March at [facility]  - 1 x daily - 2 x weekly - 15 reps - Standing Hip Flexion Extension at [employer]  - 1 x daily - 2 x weekly - 15 reps - Standing Hip Abduction Adduction at [employer]  - 1 x daily - 2 x weekly - 20 reps - Standing Knee Flexion  - 2 x daily - 6 x weekly - 3 sets - 10 reps - Squat  - 1 x daily - 2 x weekly - 15 reps - Lunge to Target at [employer]  - 1 x daily - 2 x weekly - 1 sets - 20 reps - Forward Monster Walk  - 1 x daily - 2 x weekly - 4 sets - Standing Single Leg Hip Circles  - 1 x daily - 2 x weekly - 1 sets - 20 reps - Standing Lumbar Spine Flexion Stretch Counter  - 2 x daily - 6 x weekly - 1 sets - 3 reps - 10 hold  ASSESSMENT: CLINICAL IMPRESSION: Patient met all LTGs. She appears to be functioning with her TKA at community level.  She continues to have hip weakness but has improved and is addressing with HEP including gym & aquatic exercises.    OBJECTIVE IMPAIRMENTS: Abnormal gait, decreased activity tolerance, decreased balance, decreased endurance, decreased knowledge of condition, decreased knowledge of use of DME, decreased mobility, difficulty walking, decreased ROM, decreased strength, increased edema, increased muscle spasms, postural dysfunction, and pain.   ACTIVITY LIMITATIONS: carrying, lifting, bending, sitting, standing, squatting, sleeping, stairs, transfers, bed mobility, and locomotion level  PARTICIPATION LIMITATIONS: meal prep, cleaning, laundry, driving, and community activity  PERSONAL FACTORS:  Age, Fitness, Past/current experiences, Time since onset of injury/illness/exacerbation, and 3+ comorbidities: see PMH  are also affecting patient's functional outcome.   REHAB POTENTIAL: Good  CLINICAL DECISION MAKING: Stable/uncomplicated  EVALUATION COMPLEXITY: Low   GOALS: Goals reviewed with patient? Yes  SHORT TERM GOALS: (target date for Short term goals 07/21/2022)   1.  Patient will demonstrate independent use of home exercise program to maintain progress from in clinic treatments. Goal status: MET 07/17/2022  2. PROM right knee ext -2* and flexion 100* Goal status:  MET 07/05/2022  3. Interim FOTO >/= 40%  Goal status: MET 07/12/2022  LONG TERM GOALS: (target dates for all long term goals  09/08/2022 )   1. Patient will demonstrate/report pain at worst less than or equal to 2/10 to facilitate minimal limitation in daily activity secondary to pain symptoms.  Goal status: MET 09/06/2022   2. Patient will demonstrate independent use of home exercise program to facilitate ability to maintain/progress functional gains from skilled physical therapy services.  Goal status: MET 09/06/2022   3. Patient will demonstrate FOTO outcome > or = 57 % to indicate reduced disability due to condition.  Goal status: MET 09/06/2022   4.  Patient will demonstrate right knee MMT >/= 4/5 throughout to faciltiate usual  transfers, stairs, squatting at [facility] for daily life.   Goal status: MET 09/06/2022   5.  Patient AROM right knee ext 0* and flexion 100* Goal status: MET 09/06/2022   6.  patient amb >500' & neg ramp/curb with LRAD modified independent.  Goal status: MET 09/06/2022    PLAN:  PT FREQUENCY:  2x/week  PT DURATION: 12 weeks  PLANNED INTERVENTIONS: Therapeutic exercises, Therapeutic activity, Neuro Muscular re-education, Balance training, Gait training, Patient/Family education, Joint mobilization, Stair training, DME instructions, Dry Needling, Electrical stimulation, Traction, Cryotherapy, vasopneumatic deviceMoist heat, Taping, Ultrasound, Ionotophoresis 4mg /ml Dexamethasone, and aquatic therapy, Manual therapy.  All included unless contraindicated  PLAN FOR NEXT SESSION:  discharge   Vladimir Faster, PT, DPT 09/06/2022, 1:39 PM

## 2022-10-12 ENCOUNTER — Other Ambulatory Visit: Payer: Self-pay | Admitting: Family Medicine

## 2022-10-12 DIAGNOSIS — Z1231 Encounter for screening mammogram for malignant neoplasm of breast: Secondary | ICD-10-CM

## 2022-10-17 DIAGNOSIS — F330 Major depressive disorder, recurrent, mild: Secondary | ICD-10-CM | POA: Diagnosis not present

## 2022-10-17 DIAGNOSIS — F419 Anxiety disorder, unspecified: Secondary | ICD-10-CM | POA: Diagnosis not present

## 2022-10-17 DIAGNOSIS — G4700 Insomnia, unspecified: Secondary | ICD-10-CM | POA: Diagnosis not present

## 2022-10-25 DIAGNOSIS — R739 Hyperglycemia, unspecified: Secondary | ICD-10-CM | POA: Diagnosis not present

## 2022-10-25 DIAGNOSIS — I10 Essential (primary) hypertension: Secondary | ICD-10-CM | POA: Diagnosis not present

## 2022-10-25 DIAGNOSIS — E041 Nontoxic single thyroid nodule: Secondary | ICD-10-CM | POA: Diagnosis not present

## 2022-10-25 DIAGNOSIS — Z6832 Body mass index (BMI) 32.0-32.9, adult: Secondary | ICD-10-CM | POA: Diagnosis not present

## 2022-10-25 DIAGNOSIS — F329 Major depressive disorder, single episode, unspecified: Secondary | ICD-10-CM | POA: Diagnosis not present

## 2022-10-25 DIAGNOSIS — E7800 Pure hypercholesterolemia, unspecified: Secondary | ICD-10-CM | POA: Diagnosis not present

## 2022-10-25 DIAGNOSIS — E559 Vitamin D deficiency, unspecified: Secondary | ICD-10-CM | POA: Diagnosis not present

## 2022-10-26 DIAGNOSIS — H524 Presbyopia: Secondary | ICD-10-CM | POA: Diagnosis not present

## 2022-10-26 DIAGNOSIS — H2513 Age-related nuclear cataract, bilateral: Secondary | ICD-10-CM | POA: Diagnosis not present

## 2022-10-26 DIAGNOSIS — H5203 Hypermetropia, bilateral: Secondary | ICD-10-CM | POA: Diagnosis not present

## 2022-10-26 DIAGNOSIS — H35033 Hypertensive retinopathy, bilateral: Secondary | ICD-10-CM | POA: Diagnosis not present

## 2022-10-26 DIAGNOSIS — I10 Essential (primary) hypertension: Secondary | ICD-10-CM | POA: Diagnosis not present

## 2022-10-26 DIAGNOSIS — H52223 Regular astigmatism, bilateral: Secondary | ICD-10-CM | POA: Diagnosis not present

## 2022-10-26 DIAGNOSIS — D3132 Benign neoplasm of left choroid: Secondary | ICD-10-CM | POA: Diagnosis not present

## 2022-10-30 ENCOUNTER — Ambulatory Visit
Admission: RE | Admit: 2022-10-30 | Discharge: 2022-10-30 | Disposition: A | Discharge status: Home / Self Care | Payer: Medicare Other | Source: Ambulatory Visit | Attending: Family Medicine | Admitting: Family Medicine

## 2022-10-30 DIAGNOSIS — E041 Nontoxic single thyroid nodule: Secondary | ICD-10-CM

## 2022-10-30 DIAGNOSIS — E042 Nontoxic multinodular goiter: Secondary | ICD-10-CM | POA: Diagnosis not present

## 2022-11-02 IMAGING — MR MR HEAD W/O CM
10 series · 48 of 48 positions shown · non-contrast
Comparison: None.

CLINICAL DATA: Aphasia, confusion.

EXAM:
MRI HEAD WITHOUT CONTRAST
TECHNIQUE: Multiplanar, multiecho pulse sequences of the brain and surrounding
structures were obtained without intravenous contrast.

[Series 2: T1 · sagittal · 5.0mm · 0.45mm/px · 1 of 21 slices shown]
[im 1/21]
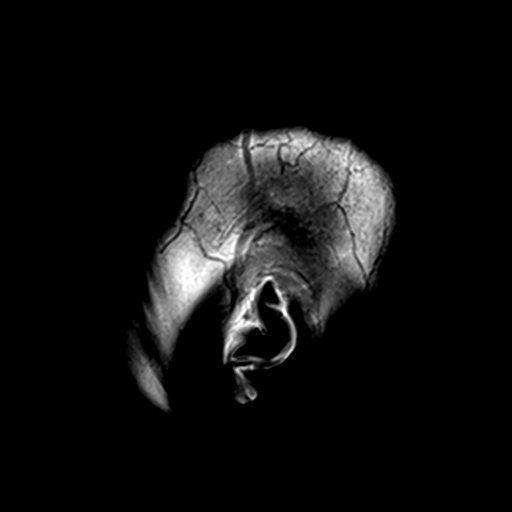

[Series 3: DWI · axial · 3.0mm · 1.88mm/px · z∈[-16,+140]mm · 9 of 107 slices shown (1 of 4)]
[im 1/107]
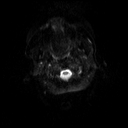
[im 14/107]
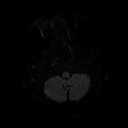
[im 27/107]
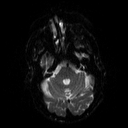
[im 40/107]
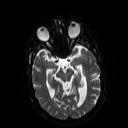
[im 54/107]
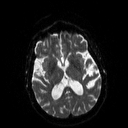
[im 67/107]
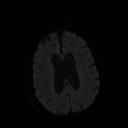
[im 80/107]
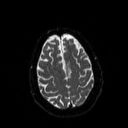
[im 93/107]
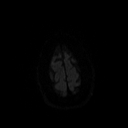
[im 107/107]
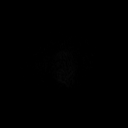

[Series 4: DWI · axial · 3.0mm · 1.88mm/px · z∈[-16,+140]mm · 5 of 52 slices shown (2 of 4)]
[im 1/52]
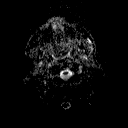
[im 13/52]
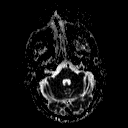
[im 26/52]
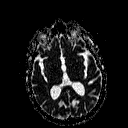
[im 39/52]
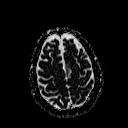
[im 52/52]
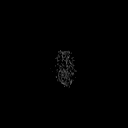

[Series 5: DWI · coronal · 5.0mm · 1.80mm/px · 6 of 67 slices shown (3 of 4)]
[im 1/67]
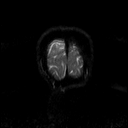
[im 14/67]
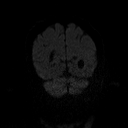
[im 27/67]
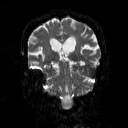
[im 40/67]
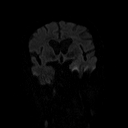
[im 53/67]
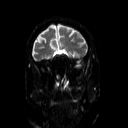
[im 67/67]
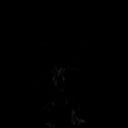

[Series 6: DWI · coronal · 5.0mm · 1.80mm/px · 3 of 34 slices shown (4 of 4)]
[im 1/34]
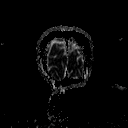
[im 17/34]
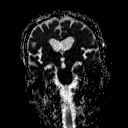
[im 34/34]
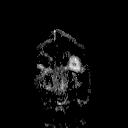

[Series 7: T2 · axial · 5.0mm · 0.51mm/px · z∈[-12,+147]mm · 2 of 24 slices shown (1 of 2)]
[im 1/24]
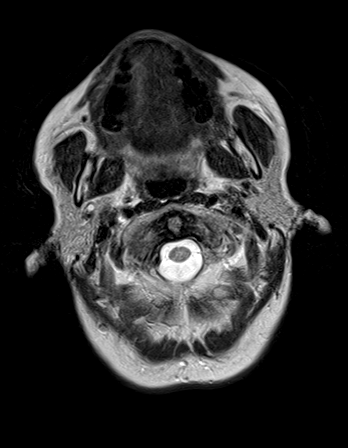
[im 24/24]
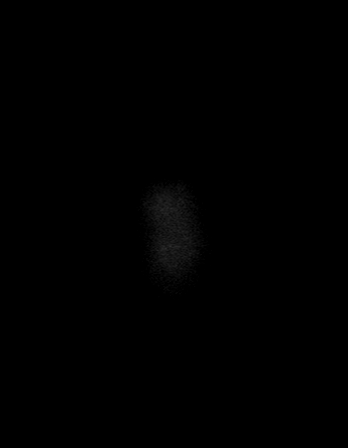

[Series 10: swi_images · axial · 4.0mm · 0.90mm/px · z∈[-18,+151]mm · 4 of 44 slices shown]
[im 1/44]
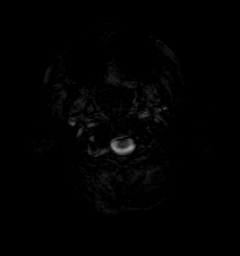
[im 15/44]
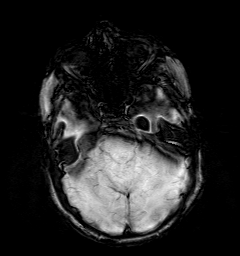
[im 29/44]
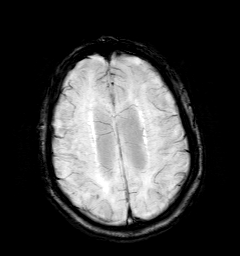
[im 44/44]
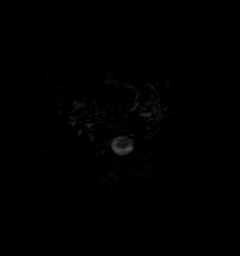

[Series 11: t1_mpr_tra · axial · 1.0mm · 0.71mm/px · z∈[-3,+138]mm · 13 of 144 slices shown]
[im 1/144]
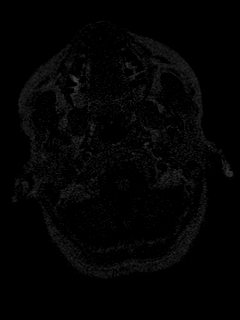
[im 12/144]
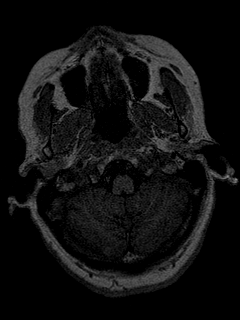
[im 24/144]
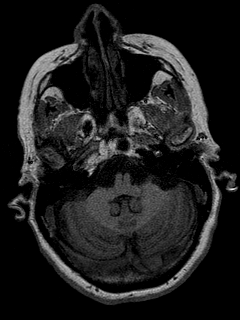
[im 36/144]
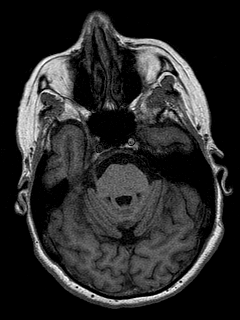
[im 48/144]
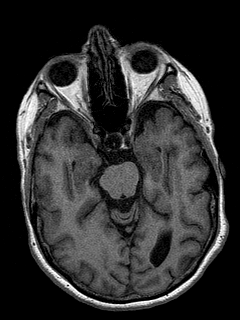
[im 60/144]
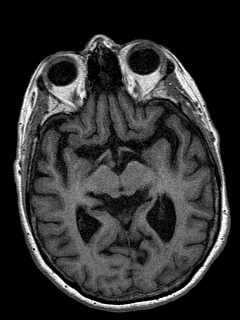
[im 72/144]
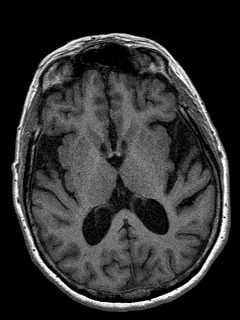
[im 84/144]
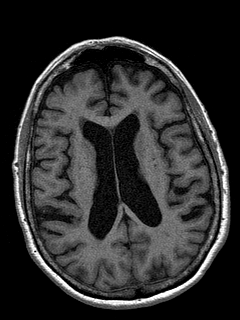
[im 96/144]
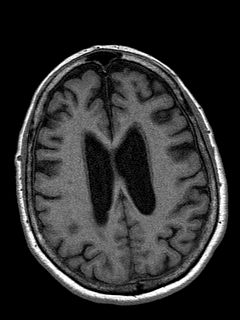
[im 108/144]
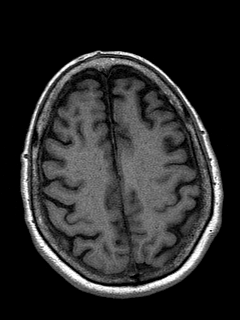
[im 120/144]
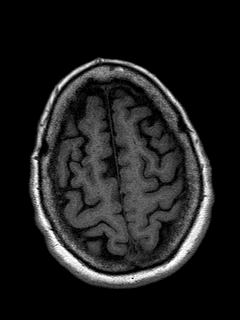
[im 132/144]
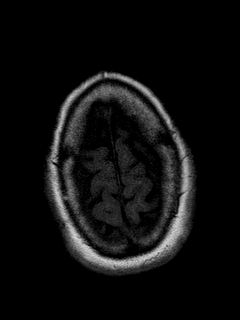
[im 144/144]
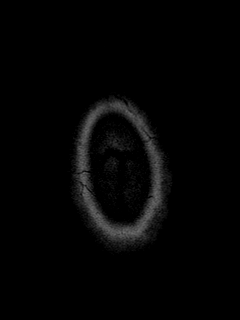

[Series 12: T2 · coronal · 5.0mm · 0.45mm/px · 2 of 25 slices shown (2 of 2)]
[im 1/25]
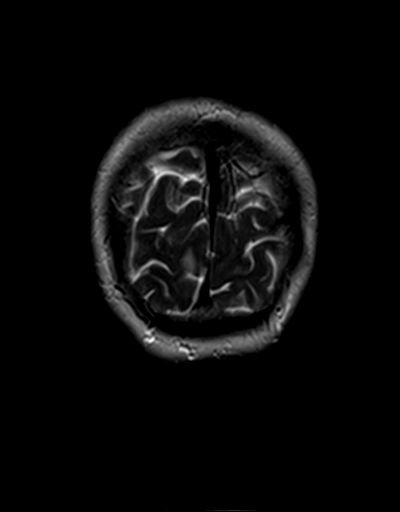
[im 25/25]
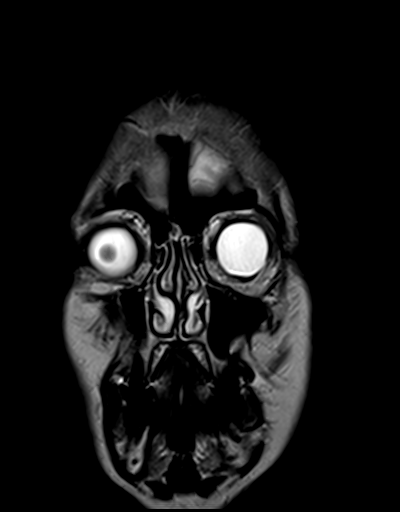

[Series 13: FLAIR · axial · 3.0mm · 0.45mm/px · z∈[-10,+141]mm · 3 of 34 slices shown]
[im 1/34]
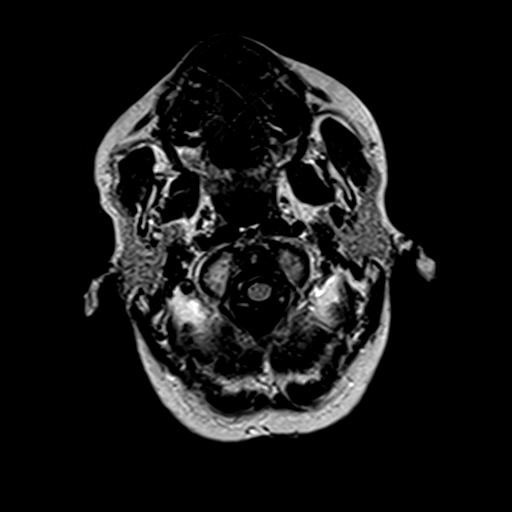
[im 17/34]
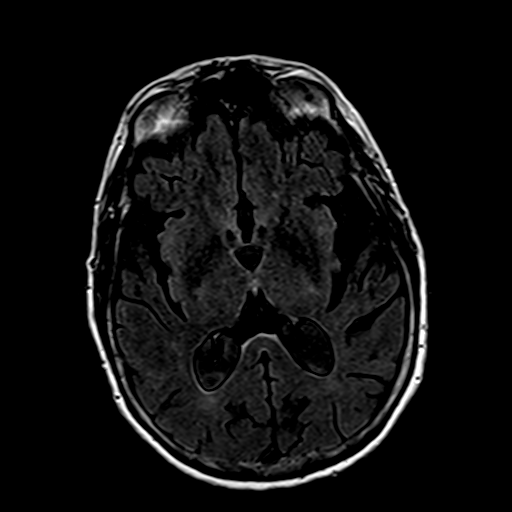
[im 34/34]
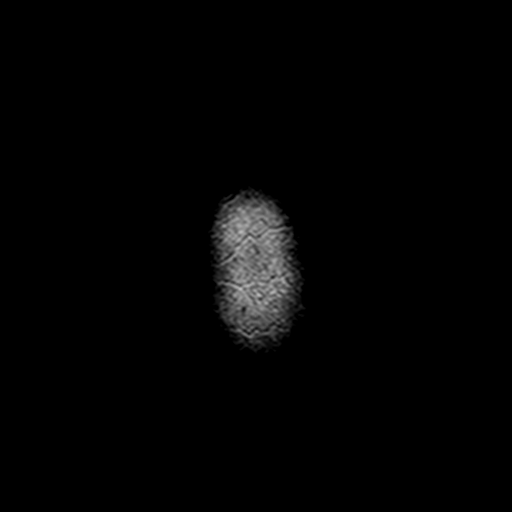

[48 of 48 positions shown; findings below may reference images not displayed]

FINDINGS: Brain: Mild generalized atrophy. Negative for hydrocephalus. Mild
white matter changes with scattered small periventricular deep white
matter hyperintensities bilaterally. Normal posterior fossa.

Negative for acute, or mass infarct lesion, hemorrhage.

Vascular: Normal arterial flow voids.

Skull and upper cervical spine: Negative

Sinuses/Orbits: Negative

Other: None
IMPRESSION: No acute abnormality

Generalized atrophy with mild white matter changes consistent with
chronic microvascular ischemia.

## 2022-11-13 ENCOUNTER — Other Ambulatory Visit: Payer: Self-pay

## 2022-11-13 ENCOUNTER — Other Ambulatory Visit (INDEPENDENT_AMBULATORY_CARE_PROVIDER_SITE_OTHER): Payer: Medicare Other

## 2022-11-13 ENCOUNTER — Ambulatory Visit (INDEPENDENT_AMBULATORY_CARE_PROVIDER_SITE_OTHER): Payer: Medicare Other | Admitting: Orthopaedic Surgery

## 2022-11-13 ENCOUNTER — Encounter: Payer: Self-pay | Admitting: Orthopaedic Surgery

## 2022-11-13 DIAGNOSIS — M549 Dorsalgia, unspecified: Secondary | ICD-10-CM | POA: Diagnosis not present

## 2022-11-13 DIAGNOSIS — Z96651 Presence of right artificial knee joint: Secondary | ICD-10-CM | POA: Diagnosis not present

## 2022-11-13 NOTE — Progress Notes (Signed)
The patient is here today for follow-up but now 5 months status post a right total knee replacement.  She is an active 77 year old female.  She reports that her knee is doing well.  However she has been dealing with significant spine issues.  She has a long and complicated history as a relates to her thoracic and lumbar spine having had a multilevel fusion several years ago.  She has been having parascapular pain around her right scapular area.  She has a long multilevel fusion from the lower T9-T10 area all the way to the pelvis.  She says her total knee is doing great.  She does have excellent range of motion of her right total knee and it feels ligamentously stable.  2 views of the right knee show well-seated total knee arthroplasty with no complicating features.  I would like to send her upstairs to physical therapy here for any modalities that can help hopefully calm down her right parascapular pain and thoracic pain.  We can then see her back in 4 weeks to see how she is doing from that standpoint and potentially obtain x-rays of the cervical spine because this could be radicular pain from her neck.  I may end up also referring her to Dr. Christell Constant for his opinion.  Will see what she looks like in 4 weeks.

## 2022-11-16 DIAGNOSIS — M546 Pain in thoracic spine: Secondary | ICD-10-CM | POA: Diagnosis not present

## 2022-11-16 DIAGNOSIS — M4184 Other forms of scoliosis, thoracic region: Secondary | ICD-10-CM | POA: Diagnosis not present

## 2022-11-16 DIAGNOSIS — M47814 Spondylosis without myelopathy or radiculopathy, thoracic region: Secondary | ICD-10-CM | POA: Diagnosis not present

## 2022-11-16 DIAGNOSIS — Z981 Arthrodesis status: Secondary | ICD-10-CM | POA: Diagnosis not present

## 2022-11-17 DIAGNOSIS — F330 Major depressive disorder, recurrent, mild: Secondary | ICD-10-CM | POA: Diagnosis not present

## 2022-11-17 DIAGNOSIS — F419 Anxiety disorder, unspecified: Secondary | ICD-10-CM | POA: Diagnosis not present

## 2022-11-20 ENCOUNTER — Ambulatory Visit
Admission: RE | Admit: 2022-11-20 | Discharge: 2022-11-20 | Disposition: A | Discharge status: Home / Self Care | Payer: Medicare Other | Source: Ambulatory Visit | Attending: Family Medicine | Admitting: Family Medicine

## 2022-11-20 DIAGNOSIS — Z1231 Encounter for screening mammogram for malignant neoplasm of breast: Secondary | ICD-10-CM | POA: Diagnosis not present

## 2022-11-27 ENCOUNTER — Ambulatory Visit: Payer: Medicare Other | Admitting: Physical Therapy

## 2022-11-28 ENCOUNTER — Ambulatory Visit: Payer: Medicare Other | Admitting: Orthopaedic Surgery

## 2022-12-11 ENCOUNTER — Ambulatory Visit (INDEPENDENT_AMBULATORY_CARE_PROVIDER_SITE_OTHER): Payer: Medicare Other | Admitting: Orthopaedic Surgery

## 2022-12-11 ENCOUNTER — Encounter: Payer: Self-pay | Admitting: Orthopaedic Surgery

## 2022-12-11 DIAGNOSIS — Z96651 Presence of right artificial knee joint: Secondary | ICD-10-CM | POA: Diagnosis not present

## 2022-12-11 NOTE — Progress Notes (Signed)
The patient is now 6 months status post a right total knee arthroplasty.  We actually x-rayed her knee last month and it looked good.  She is ambulate with a cane.  Her biggest issue relates to her spine and she let me know that she actually has an appointment this Thursday with my partner Dr. Christell Constant.  She says the knee is doing great.  She denies any pain or swelling with the right knee.  On exam the right knee has excellent range of motion and is ligamentously stable with no swelling.  We did not need to x-ray today.  From a knee standpoint we will see her back in 6 months with a standing AP and lateral right knee.  If there are issues with her joints before then she knows to let us know.

## 2022-12-14 ENCOUNTER — Other Ambulatory Visit: Payer: Self-pay

## 2022-12-14 ENCOUNTER — Ambulatory Visit: Payer: Medicare Other | Admitting: Orthopedic Surgery

## 2022-12-14 DIAGNOSIS — M545 Low back pain, unspecified: Secondary | ICD-10-CM | POA: Diagnosis not present

## 2022-12-14 MED ORDER — METHOCARBAMOL 500 MG PO TABS: 500.0000 mg | ORAL_TABLET | Freq: Four times a day (QID) | ORAL | 2 refills | Status: DC | PRN

## 2022-12-14 NOTE — Progress Notes (Signed)
Orthopedic Spine Surgery Office Note  Assessment: Patient is a 77 y.o. female with long thoracolumbar fusion, now with pain in the region above the fusion with significant degenerative changes seen on CT   Plan: -Patient has tried PT, tylenol, ibuprofen, robaxin, gabapentin  -Prescribed Robaxin since she has previously found that helpful -She can continue with gabapentin since it helps her sleep at night -If she develops a flare of pain, told her that I can prescribe her a Medrol Dosepak -Patient should return to office in 6 months, x-rays at next visit: scoliosis   Patient expressed understanding of the plan and all questions were answered to the patient's satisfaction.   ___________________________________________________________________________   History:  Patient is a 77 y.o. female who presents today for thoracic spine.  Patient underwent long thoracolumbar spinal fusion for scoliosis with Dr. Sharolyn Douglas.  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  She underwent revision surgery for pseudoarthrosis repair.  Her last surgery was in 2019.  For the last 2 to 3 years, she has had pain in her upper thoracic spine near the end of her fusion.  She feels it in that region.  It is in the midline and in the paraspinal region.  She has no pain radiating into either lower extremity.  She has recently found Robaxin to be helpful for this pain.  She has not found any other treatments recently that been helpful.   Weakness: Yes, her right leg feels weaker than her left.  This been present for several years since surgery.  No other weakness noted Symptoms of imbalance: Denies Paresthesias and numbness: Denies Bowel or bladder incontinence: Denies Saddle anesthesia: Denies  Treatments tried: PT, tylenol, ibuprofen, robaxin, gabapentin  Review of systems: Denies fevers and chills, night sweats, unexplained weight loss, history of cancer.  Has had pain that wakes her at  night  Past medical history: HTN HLD Depression  Allergies: lisinopril  Past surgical history:  T9-S1 posterior instrumented spinal fusion Revision spinal fusion for pseudoarthrosis Right THA Right revision THA Right TKA Right Achilles repair Left shoulder rotator cuff repair .  Social history: Denies use of nicotine product (smoking, vaping, patches, smokeless) Alcohol use: Yes, approximately 7 drinks per week Denies recreational drug use   Physical Exam:  General: no acute distress, appears stated age Neurologic: alert, answering questions appropriately, following commands Respiratory: unlabored breathing on room air, symmetric chest rise Psychiatric: appropriate affect, normal cadence to speech   MSK (spine):  -Strength exam      Left  Right EHL    5/5  5/5 TA    5/5  5/5 GSC    5/5  5/5 Knee extension  5/5  5/5 Hip flexion   5/5  5/5  -Sensory exam    Sensation intact to light touch in L3-S1 nerve distributions of bilateral lower extremities  -Achilles DTR: 2/4 on the left, 2/4 on the right -Patellar tendon DTR: 2/4 on the left, 2/4 on the right  -Straight leg raise: negative bilaterally -Clonus: no beats bilaterally  -Left hip exam: no pain through range of motion -Right hip exam: no pain through range of motion  Imaging: XRs of the lumbar spine from 12/14/2022 was independently reviewed and interpreted, showing pedicle screws and within the lower thoracic spine.  No lucency seen around the screws.  No screws backed out.  Physiologic kyphosis above the fusion.  No fracture or dislocation seen.    CT of the thoracic spine from 04/04/2021 was independently reviewed  and interpreted, showing no fracture or dislocation.  Instrumentation in the lower thoracic spine appears in good position.  No lucency around the screws.  T5/6 degenerative changes with vacuum disc phenomenon and anterior osteophyte formation. Cyst formation seen within the endplates at T5/6  as well.     Patient name: [name] Patient MRN: [id number] Date of visit: 12/14/22

## 2023-02-06 DIAGNOSIS — F419 Anxiety disorder, unspecified: Secondary | ICD-10-CM | POA: Diagnosis not present

## 2023-02-06 DIAGNOSIS — F330 Major depressive disorder, recurrent, mild: Secondary | ICD-10-CM | POA: Diagnosis not present

## 2023-03-19 DIAGNOSIS — F419 Anxiety disorder, unspecified: Secondary | ICD-10-CM | POA: Diagnosis not present

## 2023-03-19 DIAGNOSIS — F330 Major depressive disorder, recurrent, mild: Secondary | ICD-10-CM | POA: Diagnosis not present

## 2023-04-30 ENCOUNTER — Ambulatory Visit (HOSPITAL_BASED_OUTPATIENT_CLINIC_OR_DEPARTMENT_OTHER): Admitting: Student

## 2023-04-30 ENCOUNTER — Ambulatory Visit (HOSPITAL_BASED_OUTPATIENT_CLINIC_OR_DEPARTMENT_OTHER)

## 2023-04-30 ENCOUNTER — Encounter (HOSPITAL_BASED_OUTPATIENT_CLINIC_OR_DEPARTMENT_OTHER): Payer: Self-pay | Admitting: Student

## 2023-04-30 DIAGNOSIS — M25562 Pain in left knee: Secondary | ICD-10-CM | POA: Diagnosis not present

## 2023-04-30 DIAGNOSIS — M1712 Unilateral primary osteoarthritis, left knee: Secondary | ICD-10-CM | POA: Diagnosis not present

## 2023-04-30 DIAGNOSIS — S76302A Unspecified injury of muscle, fascia and tendon of the posterior muscle group at thigh level, left thigh, initial encounter: Secondary | ICD-10-CM

## 2023-04-30 DIAGNOSIS — M25462 Effusion, left knee: Secondary | ICD-10-CM | POA: Diagnosis not present

## 2023-04-30 NOTE — Progress Notes (Signed)
 Chief Complaint: Left knee pain    Discussed the use of AI scribe software for clinical note transcription with the patient, who gave verbal consent to proceed.  History of Present Illness The patient, with a past medical history of right knee and hip replacements, scoliosis, and arthritis, presents with a chief complaint of left knee pain. The pain has been ongoing for over a week and has been progressively worsening. The patient reports no specific incident that could have triggered the pain, suggesting it may have started spontaneously. The pain is located in the back of the knee, extending slightly up the thigh, but does not radiate further. The patient denies any tingling in the feet or swelling in the calf. The pain is particularly severe when the patient is walking, but can be managed if the knee is kept straight. The patient has been managing the pain with ibuprofen  and Biofreeze, with limited success.    Surgical History:   None  PMH/PSH/Family History/Social History/Meds/Allergies:    Past Medical History:  Diagnosis Date   Anemia    Congenital musculoskeletal deformity of spine    Depression    Headache    Hypertension    Incontinence of bowel 09/2013   Lumbago    Lumbosacral spondylosis without myelopathy    Sleep apnea    Sleep disorder    uses gabapentin  at bedtime    Spinal stenosis, lumbar region, without neurogenic claudication    Past Surgical History:  Procedure Laterality Date   ANKLE SURGERY Right 2015   lengthened the achilles    ANTERIOR HIP REVISION Right 10/07/2020   Procedure: RIGHT HIP REVISION OF  HIP BALL/POLY-LINER;  Surgeon: Arnie Lao, MD;  Location: [facility] OR;  Service: Orthopedics;  Laterality: Right;  RNFA PLEASE   AUGMENTATION MAMMAPLASTY Bilateral 2000 or 2001 unsure    Left Implant has busted and is no longer visible   BACK SURGERY  2015   fusion , Dr Faylene Hoots at [facility]; has 2 metal  rods in place , fusion from t2 to s1    BACK SURGERY  01/2017   Dr Jaquita Merl ; repair of cracked titanium rod in back lumbar    BREAST EXCISIONAL BIOPSY Left    30 yrs ago   bunionectomy  Bilateral 1980s   multiple    COLONOSCOPY     with polypectomy ; q 5 years    FOOT SURGERY  07/2011   SHOULDER SURGERY Left 2013   rotator cuff    TOTAL HIP ARTHROPLASTY Right 05/08/2017   Procedure: RIGHT TOTAL HIP ARTHROPLASTY ANTERIOR APPROACH;  Surgeon: Claiborne Crew, MD;  Location: [facility];  Service: Orthopedics;  Laterality: Right;  70 mins   TOTAL KNEE ARTHROPLASTY Right 06/06/2022   Procedure: RIGHT TOTAL KNEE ARTHROPLASTY;  Surgeon: Arnie Lao, MD;  Location: [facility] OR;  Service: Orthopedics;  Laterality: Right;   Social History   Socioeconomic History   Marital status: Married    Spouse name: Not on file   Number of children: Not on file   Years of education: Not on file   Highest education level: Not on file  Occupational History   Not on file  Tobacco Use   Smoking status: Former   Smokeless tobacco: Never   Tobacco comments:    smoked for ayear and  half in college   Vaping Use   Vaping status: Never Used  Substance and Sexual Activity   Alcohol use: Yes    Alcohol/week: 2.0 standard drinks of alcohol    Types: 2 Glasses of wine per week    Comment: daily   Drug use: Never   Sexual activity: Not on file  Other Topics Concern   Not on file  Social History Narrative   Right handed   Caffeine intake    2 story home   Social Drivers of Health   Financial Resource Strain: Not on file  Food Insecurity: No Food Insecurity (06/06/2022)   Hunger Vital Sign    Worried About Running Out of Food in the Last Year: Never true    Ran Out of Food in the Last Year: Never true  Transportation Needs: Unknown (06/06/2022)   PRAPARE - Administrator, Civil Service (Medical): No    Lack of Transportation (Non-Medical): Patient declined  Physical Activity: Not on file   Stress: Not on file  Social Connections: Not on file   Family History  Problem Relation Age of Onset   Dementia Mother    Stroke Mother    Depression Mother    Thyroid  disease Mother    COPD Father    Cancer Brother    Breast cancer Neg Hx    Allergies  Allergen Reactions   Lisinopril Cough   Current Outpatient Medications  Medication Sig Dispense Refill   buPROPion  (WELLBUTRIN  XL) 150 MG 24 hr tablet Take 450 mg by mouth daily.     celecoxib  (CELEBREX ) 200 MG capsule TAKE 1 CAPSULE (200 MG TOTAL) BY MOUTH 2 (TWO) TIMES DAILY BETWEEN MEALS AS NEEDED. 60 capsule 1   cetirizine (ZYRTEC) 10 MG tablet Take 10 mg by mouth daily as needed for allergies.     diphenhydrAMINE  (BENADRYL ) 50 MG capsule Take 50 mg by mouth at bedtime as needed for sleep.     docusate sodium  (COLACE) 50 MG capsule Take 50 mg by mouth daily.     gabapentin  (NEURONTIN ) 600 MG tablet Take 1 tablet (600 mg total) by mouth daily. 3 tabs QHS (Patient taking differently: Take 600 mg by mouth at bedtime.) 3 tablet 1   ibuprofen  (ADVIL ) 200 MG tablet Take 200 mg by mouth every 6 (six) hours as needed for moderate pain.     losartan  (COZAAR ) 50 MG tablet Take 50 mg by mouth daily.     Menthol , Topical Analgesic, (BIOFREEZE) 10 % LIQD Apply 1 application  topically daily as needed (pain).     methocarbamol  (ROBAXIN ) 500 MG tablet Take 1 tablet (500 mg total) by mouth every 6 (six) hours as needed (pain, muscle spasms). 50 tablet 2   Naphazoline-Pheniramine (VISINE OP) Place 1 drop into both eyes daily as needed (allergies).     rosuvastatin  (CRESTOR ) 40 MG tablet Take 40 mg by mouth daily.     Vitamin D, Ergocalciferol, (DRISDOL) 50000 UNITS CAPS capsule Take 50,000 Units by mouth every Thursday.      No current facility-administered medications for this visit.   DG Knee Complete 4 Views Left Result Date: 04/30/2023 CLINICAL DATA:  Left knee pain. EXAM: LEFT KNEE - COMPLETE 4+ VIEW COMPARISON:  Left knee radiographs  07/20/2021 FINDINGS: There is diffuse decreased bone mineralization. Moderate medial current joint space narrowing and peripheral osteophytosis. Moderate patellofemoral joint space narrowing and peripheral osteophytosis. Tiny joint effusion is similar to prior. Moderate chronic enthesopathic change at the quadriceps insertion  on the patella. Mild peripheral lateral compartment degenerative osteophytosis. No acute fracture dislocation. IMPRESSION: 1. 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 2. Tiny joint effusion. Electronically Signed   By: Bertina Broccoli M.D.   On: 04/30/2023 17:26    Review of Systems:   A ROS was performed including pertinent positives and negatives as documented in the HPI.  Physical Exam :   Constitutional: NAD and appears stated age Neurological: Alert and oriented Psych: Appropriate affect and cooperative There were no vitals taken for this visit.   Comprehensive Musculoskeletal Exam:    Left knee exam demonstrates active range of motion from 0 to 100 degrees.  No medial or lateral joint line tenderness.  Tenderness along the posterior knee, particularly laterally over the biceps for Morris insertion, tendon, and musculotendinous junction.  5/5 strength with flexion extension although flexion does increase discomfort.  No laxity with varus or valgus stress.  Calf is supple and nontender without pain with plantarflexion or dorsiflexion.  Distal neurosensory exam intact.  Imaging:   Xray (left knee 4 views): Tricompartmental osteoarthritis, with moderate joint space narrowing medially and joint space narrowing with osteophytes noted in the patellofemoral compartment.  Otherwise negative for bony abnormality.   I personally reviewed and interpreted the radiographs.      Assessment & Plan Left knee pain   Pain is likely due to a hamstring strain at the musculotendinous junction, with x-rays showing arthritis but no acute changes.  Does not recall a  specific injury, but suspects she may have done something without knowing.  Apply heat for muscle relaxation and continue ibuprofen  600 mg as needed. Engage in gentle stretching exercises and provide a symptom update in 1-2 weeks. Consider referral to physical therapy or shockwave therapy if symptoms persist.     I personally saw and evaluated the patient, and participated in the management and treatment plan.  Sharrell Deck, PA-C Orthopedics

## 2023-05-07 DIAGNOSIS — F419 Anxiety disorder, unspecified: Secondary | ICD-10-CM | POA: Diagnosis not present

## 2023-05-07 DIAGNOSIS — F330 Major depressive disorder, recurrent, mild: Secondary | ICD-10-CM | POA: Diagnosis not present

## 2023-06-05 DIAGNOSIS — M1990 Unspecified osteoarthritis, unspecified site: Secondary | ICD-10-CM | POA: Diagnosis not present

## 2023-06-05 DIAGNOSIS — I10 Essential (primary) hypertension: Secondary | ICD-10-CM | POA: Diagnosis not present

## 2023-06-05 DIAGNOSIS — Z0000 Encounter for general adult medical examination without abnormal findings: Secondary | ICD-10-CM | POA: Diagnosis not present

## 2023-06-05 DIAGNOSIS — E7800 Pure hypercholesterolemia, unspecified: Secondary | ICD-10-CM | POA: Diagnosis not present

## 2023-06-05 DIAGNOSIS — Z6834 Body mass index (BMI) 34.0-34.9, adult: Secondary | ICD-10-CM | POA: Diagnosis not present

## 2023-06-05 DIAGNOSIS — F3341 Major depressive disorder, recurrent, in partial remission: Secondary | ICD-10-CM | POA: Diagnosis not present

## 2023-06-05 DIAGNOSIS — E559 Vitamin D deficiency, unspecified: Secondary | ICD-10-CM | POA: Diagnosis not present

## 2023-06-05 DIAGNOSIS — E041 Nontoxic single thyroid nodule: Secondary | ICD-10-CM | POA: Diagnosis not present

## 2023-06-05 DIAGNOSIS — I6523 Occlusion and stenosis of bilateral carotid arteries: Secondary | ICD-10-CM | POA: Diagnosis not present

## 2023-06-14 ENCOUNTER — Ambulatory Visit (INDEPENDENT_AMBULATORY_CARE_PROVIDER_SITE_OTHER): Payer: Medicare Other | Admitting: Orthopedic Surgery

## 2023-06-14 ENCOUNTER — Other Ambulatory Visit (INDEPENDENT_AMBULATORY_CARE_PROVIDER_SITE_OTHER): Payer: Self-pay

## 2023-06-14 DIAGNOSIS — M545 Low back pain, unspecified: Secondary | ICD-10-CM

## 2023-06-14 DIAGNOSIS — M549 Dorsalgia, unspecified: Secondary | ICD-10-CM

## 2023-06-14 NOTE — Progress Notes (Signed)
 Orthopedic Spine Surgery Office Note   Assessment: Patient is a 78 y.o. female with long thoracolumbar fusion and chronic thoracic back pain.  Has positive sagittal balance with an SVA of 14 cm.  Also has degenerative changes in her thoracic spine above her fusion     Plan: -Patient has tried PT, tylenol , ibuprofen , robaxin , gabapentin   - Patient wants to continue with ibuprofen .  She is going to continue to use gabapentin  to help her sleep at night.  She is not interested in using Robaxin  at this time. -We talked about her deformity with sagittal imbalance.  Discussed her options including light aerobic activity, stretching, extensor muscle strengthening, medication management, surgical correction, pain management -Patient is not interested in surgery so she can continue with the ibuprofen , light aerobic activity, stretching, extensor muscle strengthening.  If pain gets worse, then pain management would be an option for -Patient should return to office in 1 year, x-rays at next visit: scoliosis     Patient expressed understanding of the plan and all questions were answered to the patient's satisfaction.    ___________________________________________________________________________     History:   Patient is a 78 y.o. female who presents today for follow up on her back pain.  To recap, patient had a long track all lumbar spine fusion with Dr. Jaquita Merl here in town.  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  She underwent a revision surgery for that.  She has had thoracic back pain for several years.  She has no pain rating into either lower extremity.  She also has pain in the left paraspinal muscles in the thoracic and lumbar spine.  She has not noticed any significant changes since she was last seen in the office.  She has not developed any new symptoms.     Treatments tried: PT, tylenol , ibuprofen , robaxin , gabapentin     Physical Exam:   General: no acute  distress, appears stated age Neurologic: alert, answering questions appropriately, following commands Respiratory: unlabored breathing on room air, symmetric chest rise Psychiatric: appropriate affect, normal cadence to speech     MSK (spine):   -Strength exam                                                   Left                  Right EHL                              5/5                  5/5 TA                                 5/5                  5/5 GSC                             5/5                  5/5 Knee extension            5/5  5/5 Hip flexion                    5/5                  5/5   -Sensory exam                           Sensation intact to light touch in L3-S1 nerve distributions of bilateral lower extremities    Imaging: XRs scoliosis from 06/14/2023 was independent reviewed interpreted, showing long thoracolumbar fusion to the sacrum.  No lucency seen around the screws.  None of the screws backed out.  No broken instrumentation seen.  No spondylolisthesis seen.  No fracture or dislocation seen.  SVA of 14 cm.  T1 pelvis angle of 29 degrees.  PI of 68, LL of 50.    Patient name: SHAANA ACOCELLA Patient MRN: [id number] Date of visit: 06/14/23  I spent 30 minutes in the room going over the patient's x-rays and showing her the measurements.  I explained some of these deformity measurements to her and how this can contribute to her pain.  I also went over treatments for her.  Those ranged from light aerobic activity to surgical correction.  I covered that surgical reduction would entail a big surgery with 30% chance of major complication.

## 2023-06-18 DIAGNOSIS — F419 Anxiety disorder, unspecified: Secondary | ICD-10-CM | POA: Diagnosis not present

## 2023-06-18 DIAGNOSIS — F330 Major depressive disorder, recurrent, mild: Secondary | ICD-10-CM | POA: Diagnosis not present

## 2023-07-10 DIAGNOSIS — K5901 Slow transit constipation: Secondary | ICD-10-CM | POA: Diagnosis not present

## 2023-07-10 DIAGNOSIS — I10 Essential (primary) hypertension: Secondary | ICD-10-CM | POA: Diagnosis not present

## 2023-07-10 DIAGNOSIS — E559 Vitamin D deficiency, unspecified: Secondary | ICD-10-CM | POA: Diagnosis not present

## 2023-07-10 DIAGNOSIS — E7800 Pure hypercholesterolemia, unspecified: Secondary | ICD-10-CM | POA: Diagnosis not present

## 2023-07-10 DIAGNOSIS — E041 Nontoxic single thyroid nodule: Secondary | ICD-10-CM | POA: Diagnosis not present

## 2023-07-22 ENCOUNTER — Telehealth: Payer: Self-pay | Admitting: *Deleted

## 2023-07-22 NOTE — Telephone Encounter (Signed)
 This was a late entry for 09/04/22 appointment with therapy in which CM met with patient after her appointment.

## 2023-07-30 DIAGNOSIS — F419 Anxiety disorder, unspecified: Secondary | ICD-10-CM | POA: Diagnosis not present

## 2023-07-30 DIAGNOSIS — F330 Major depressive disorder, recurrent, mild: Secondary | ICD-10-CM | POA: Diagnosis not present

## 2023-09-05 ENCOUNTER — Inpatient Hospital Stay (HOSPITAL_COMMUNITY)
Admission: EM | Admit: 2023-09-05 | Discharge: 2023-09-11 | DRG: 964 | Disposition: A | Discharge status: Skilled Nursing Facility | Attending: General Surgery | Admitting: General Surgery

## 2023-09-05 ENCOUNTER — Emergency Department (HOSPITAL_COMMUNITY)

## 2023-09-05 ENCOUNTER — Other Ambulatory Visit: Payer: Self-pay

## 2023-09-05 ENCOUNTER — Encounter (HOSPITAL_COMMUNITY): Payer: Self-pay | Admitting: Radiology

## 2023-09-05 DIAGNOSIS — Z888 Allergy status to other drugs, medicaments and biological substances status: Secondary | ICD-10-CM

## 2023-09-05 DIAGNOSIS — S3729XA Other injury of bladder, initial encounter: Secondary | ICD-10-CM | POA: Diagnosis present

## 2023-09-05 DIAGNOSIS — Z96651 Presence of right artificial knee joint: Secondary | ICD-10-CM | POA: Diagnosis not present

## 2023-09-05 DIAGNOSIS — Y92009 Unspecified place in unspecified non-institutional (private) residence as the place of occurrence of the external cause: Secondary | ICD-10-CM

## 2023-09-05 DIAGNOSIS — Z8349 Family history of other endocrine, nutritional and metabolic diseases: Secondary | ICD-10-CM

## 2023-09-05 DIAGNOSIS — T1490XA Injury, unspecified, initial encounter: Principal | ICD-10-CM | POA: Diagnosis present

## 2023-09-05 DIAGNOSIS — E785 Hyperlipidemia, unspecified: Secondary | ICD-10-CM | POA: Diagnosis not present

## 2023-09-05 DIAGNOSIS — F32A Depression, unspecified: Secondary | ICD-10-CM | POA: Diagnosis present

## 2023-09-05 DIAGNOSIS — I700 Atherosclerosis of aorta: Secondary | ICD-10-CM | POA: Diagnosis not present

## 2023-09-05 DIAGNOSIS — E871 Hypo-osmolality and hyponatremia: Secondary | ICD-10-CM | POA: Diagnosis present

## 2023-09-05 DIAGNOSIS — Z825 Family history of asthma and other chronic lower respiratory diseases: Secondary | ICD-10-CM

## 2023-09-05 DIAGNOSIS — R1030 Lower abdominal pain, unspecified: Secondary | ICD-10-CM | POA: Diagnosis not present

## 2023-09-05 DIAGNOSIS — S3282XA Multiple fractures of pelvis without disruption of pelvic ring, initial encounter for closed fracture: Secondary | ICD-10-CM

## 2023-09-05 DIAGNOSIS — E041 Nontoxic single thyroid nodule: Secondary | ICD-10-CM | POA: Diagnosis not present

## 2023-09-05 DIAGNOSIS — K5909 Other constipation: Secondary | ICD-10-CM | POA: Diagnosis not present

## 2023-09-05 DIAGNOSIS — Z96641 Presence of right artificial hip joint: Secondary | ICD-10-CM | POA: Diagnosis present

## 2023-09-05 DIAGNOSIS — S3282XD Multiple fractures of pelvis without disruption of pelvic ring, subsequent encounter for fracture with routine healing: Secondary | ICD-10-CM | POA: Diagnosis not present

## 2023-09-05 DIAGNOSIS — W19XXXA Unspecified fall, initial encounter: Secondary | ICD-10-CM | POA: Diagnosis not present

## 2023-09-05 DIAGNOSIS — I10 Essential (primary) hypertension: Secondary | ICD-10-CM | POA: Diagnosis not present

## 2023-09-05 DIAGNOSIS — S299XXA Unspecified injury of thorax, initial encounter: Secondary | ICD-10-CM | POA: Diagnosis not present

## 2023-09-05 DIAGNOSIS — R300 Dysuria: Secondary | ICD-10-CM | POA: Diagnosis not present

## 2023-09-05 DIAGNOSIS — G4730 Sleep apnea, unspecified: Secondary | ICD-10-CM | POA: Diagnosis present

## 2023-09-05 DIAGNOSIS — S3091XA Unspecified superficial injury of lower back and pelvis, initial encounter: Secondary | ICD-10-CM | POA: Diagnosis not present

## 2023-09-05 DIAGNOSIS — R159 Full incontinence of feces: Secondary | ICD-10-CM | POA: Diagnosis present

## 2023-09-05 DIAGNOSIS — Z79899 Other long term (current) drug therapy: Secondary | ICD-10-CM | POA: Diagnosis not present

## 2023-09-05 DIAGNOSIS — S3720XD Unspecified injury of bladder, subsequent encounter: Secondary | ICD-10-CM | POA: Diagnosis not present

## 2023-09-05 DIAGNOSIS — W010XXA Fall on same level from slipping, tripping and stumbling without subsequent striking against object, initial encounter: Secondary | ICD-10-CM | POA: Diagnosis present

## 2023-09-05 DIAGNOSIS — S3720XA Unspecified injury of bladder, initial encounter: Secondary | ICD-10-CM | POA: Diagnosis not present

## 2023-09-05 DIAGNOSIS — E042 Nontoxic multinodular goiter: Secondary | ICD-10-CM | POA: Diagnosis not present

## 2023-09-05 DIAGNOSIS — M6281 Muscle weakness (generalized): Secondary | ICD-10-CM | POA: Diagnosis not present

## 2023-09-05 DIAGNOSIS — M1990 Unspecified osteoarthritis, unspecified site: Secondary | ICD-10-CM | POA: Diagnosis present

## 2023-09-05 DIAGNOSIS — E663 Overweight: Secondary | ICD-10-CM | POA: Diagnosis not present

## 2023-09-05 DIAGNOSIS — M47817 Spondylosis without myelopathy or radiculopathy, lumbosacral region: Secondary | ICD-10-CM | POA: Diagnosis present

## 2023-09-05 DIAGNOSIS — R9389 Abnormal findings on diagnostic imaging of other specified body structures: Secondary | ICD-10-CM | POA: Diagnosis not present

## 2023-09-05 DIAGNOSIS — Z823 Family history of stroke: Secondary | ICD-10-CM | POA: Diagnosis not present

## 2023-09-05 DIAGNOSIS — Z9181 History of falling: Secondary | ICD-10-CM

## 2023-09-05 DIAGNOSIS — G8929 Other chronic pain: Secondary | ICD-10-CM | POA: Diagnosis not present

## 2023-09-05 DIAGNOSIS — R2689 Other abnormalities of gait and mobility: Secondary | ICD-10-CM | POA: Diagnosis not present

## 2023-09-05 DIAGNOSIS — S32599A Other specified fracture of unspecified pubis, initial encounter for closed fracture: Secondary | ICD-10-CM | POA: Diagnosis not present

## 2023-09-05 DIAGNOSIS — R41841 Cognitive communication deficit: Secondary | ICD-10-CM | POA: Diagnosis not present

## 2023-09-05 DIAGNOSIS — G8911 Acute pain due to trauma: Secondary | ICD-10-CM | POA: Diagnosis not present

## 2023-09-05 DIAGNOSIS — Z7401 Bed confinement status: Secondary | ICD-10-CM | POA: Diagnosis not present

## 2023-09-05 DIAGNOSIS — R238 Other skin changes: Secondary | ICD-10-CM | POA: Diagnosis not present

## 2023-09-05 DIAGNOSIS — S32591A Other specified fracture of right pubis, initial encounter for closed fracture: Secondary | ICD-10-CM | POA: Diagnosis present

## 2023-09-05 DIAGNOSIS — R102 Pelvic and perineal pain: Secondary | ICD-10-CM | POA: Diagnosis not present

## 2023-09-05 DIAGNOSIS — R1312 Dysphagia, oropharyngeal phase: Secondary | ICD-10-CM | POA: Diagnosis not present

## 2023-09-05 DIAGNOSIS — J302 Other seasonal allergic rhinitis: Secondary | ICD-10-CM | POA: Diagnosis not present

## 2023-09-05 DIAGNOSIS — M25551 Pain in right hip: Secondary | ICD-10-CM | POA: Diagnosis not present

## 2023-09-05 DIAGNOSIS — S32592A Other specified fracture of left pubis, initial encounter for closed fracture: Secondary | ICD-10-CM | POA: Diagnosis not present

## 2023-09-05 DIAGNOSIS — Z981 Arthrodesis status: Secondary | ICD-10-CM

## 2023-09-05 DIAGNOSIS — Z87891 Personal history of nicotine dependence: Secondary | ICD-10-CM | POA: Diagnosis not present

## 2023-09-05 DIAGNOSIS — S0990XA Unspecified injury of head, initial encounter: Secondary | ICD-10-CM | POA: Diagnosis not present

## 2023-09-05 DIAGNOSIS — M419 Scoliosis, unspecified: Secondary | ICD-10-CM | POA: Diagnosis not present

## 2023-09-05 DIAGNOSIS — M545 Low back pain, unspecified: Secondary | ICD-10-CM | POA: Diagnosis not present

## 2023-09-05 DIAGNOSIS — D259 Leiomyoma of uterus, unspecified: Secondary | ICD-10-CM | POA: Diagnosis not present

## 2023-09-05 DIAGNOSIS — F5101 Primary insomnia: Secondary | ICD-10-CM | POA: Diagnosis not present

## 2023-09-05 DIAGNOSIS — G9389 Other specified disorders of brain: Secondary | ICD-10-CM | POA: Diagnosis not present

## 2023-09-05 DIAGNOSIS — S32810A Multiple fractures of pelvis with stable disruption of pelvic ring, initial encounter for closed fracture: Secondary | ICD-10-CM | POA: Diagnosis not present

## 2023-09-05 DIAGNOSIS — S199XXA Unspecified injury of neck, initial encounter: Secondary | ICD-10-CM | POA: Diagnosis not present

## 2023-09-05 DIAGNOSIS — G4733 Obstructive sleep apnea (adult) (pediatric): Secondary | ICD-10-CM | POA: Diagnosis not present

## 2023-09-05 DIAGNOSIS — M25552 Pain in left hip: Secondary | ICD-10-CM | POA: Diagnosis not present

## 2023-09-05 DIAGNOSIS — L299 Pruritus, unspecified: Secondary | ICD-10-CM | POA: Diagnosis not present

## 2023-09-05 LAB — CBC WITH DIFFERENTIAL/PLATELET
Abs Immature Granulocytes: 0.15 K/uL — ABNORMAL HIGH (ref 0.00–0.07)
Basophils Absolute: 0.1 K/uL (ref 0.0–0.1)
Basophils Relative: 0 %
Eosinophils Absolute: 0.2 K/uL (ref 0.0–0.5)
Eosinophils Relative: 1 %
HCT: 42 % (ref 36.0–46.0)
Hemoglobin: 13.1 g/dL (ref 12.0–15.0)
Immature Granulocytes: 1 %
Lymphocytes Relative: 7 %
Lymphs Abs: 1.2 K/uL (ref 0.7–4.0)
MCH: 31.4 pg (ref 26.0–34.0)
MCHC: 31.2 g/dL (ref 30.0–36.0)
MCV: 100.7 fL — ABNORMAL HIGH (ref 80.0–100.0)
Monocytes Absolute: 0.8 K/uL (ref 0.1–1.0)
Monocytes Relative: 5 %
Neutro Abs: 13.7 K/uL — ABNORMAL HIGH (ref 1.7–7.7)
Neutrophils Relative %: 86 %
Platelets: 187 K/uL (ref 150–400)
RBC: 4.17 MIL/uL (ref 3.87–5.11)
RDW: 13.3 % (ref 11.5–15.5)
WBC: 16 K/uL — ABNORMAL HIGH (ref 4.0–10.5)
nRBC: 0 % (ref 0.0–0.2)

## 2023-09-05 LAB — BASIC METABOLIC PANEL WITH GFR
Anion gap: 13 (ref 5–15)
BUN: 15 mg/dL (ref 8–23)
CO2: 15 mmol/L — ABNORMAL LOW (ref 22–32)
Calcium: 8.7 mg/dL — ABNORMAL LOW (ref 8.9–10.3)
Chloride: 115 mmol/L — ABNORMAL HIGH (ref 98–111)
Creatinine, Ser: 0.82 mg/dL (ref 0.44–1.00)
GFR, Estimated: >60 mL/min (ref >60)
Glucose, Bld: 90 mg/dL (ref 70–99)
Potassium: 4.3 mmol/L (ref 3.5–5.1)
Sodium: 143 mmol/L (ref 135–145)

## 2023-09-05 MED ORDER — CHLORHEXIDINE GLUCONATE CLOTH 2 % EX PADS
6.0000 | MEDICATED_PAD | Freq: Every day | CUTANEOUS | Status: DC
  Administered 2023-09-06 – 2023-09-11 (×6): 6 via TOPICAL

## 2023-09-05 MED ORDER — ROSUVASTATIN CALCIUM 20 MG PO TABS
40.0000 mg | ORAL_TABLET | Freq: Every day | ORAL | Status: DC
  Administered 2023-09-06 – 2023-09-11 (×6): 40 mg via ORAL
  Filled 2023-09-05 (×6): qty 2

## 2023-09-05 MED ORDER — ONDANSETRON HCL 4 MG/2ML IJ SOLN
4.0000 mg | Freq: Four times a day (QID) | INTRAMUSCULAR | Status: DC | PRN
Start: 2023-09-05 — End: 2023-09-11

## 2023-09-05 MED ORDER — METOPROLOL TARTRATE 5 MG/5ML IV SOLN: 5.0000 mg | Freq: Four times a day (QID) | INTRAVENOUS | Status: DC | PRN

## 2023-09-05 MED ORDER — HYDROMORPHONE HCL 1 MG/ML IJ SOLN
1.0000 mg | Freq: Once | INTRAMUSCULAR | Status: AC
  Administered 2023-09-05: 1 mg via INTRAVENOUS
  Filled 2023-09-05: qty 1

## 2023-09-05 MED ORDER — MORPHINE SULFATE (PF) 4 MG/ML IV SOLN
4.0000 mg | Freq: Once | INTRAVENOUS | Status: AC
  Administered 2023-09-05: 4 mg via INTRAVENOUS
  Filled 2023-09-05: qty 1

## 2023-09-05 MED ORDER — METHOCARBAMOL 1000 MG/10ML IJ SOLN: 500.0000 mg | Freq: Three times a day (TID) | INTRAMUSCULAR | Status: DC

## 2023-09-05 MED ORDER — HYDROMORPHONE HCL 1 MG/ML IJ SOLN
1.0000 mg | INTRAMUSCULAR | Status: DC | PRN
  Administered 2023-09-06 (×5): 1 mg via INTRAVENOUS
  Filled 2023-09-05 (×5): qty 1

## 2023-09-05 MED ORDER — METHOCARBAMOL 500 MG PO TABS
500.0000 mg | ORAL_TABLET | Freq: Three times a day (TID) | ORAL | Status: DC
  Administered 2023-09-06 (×2): 500 mg via ORAL
  Filled 2023-09-05 (×2): qty 1

## 2023-09-05 MED ORDER — OXYCODONE HCL 5 MG PO TABS: 5.0000 mg | ORAL_TABLET | ORAL | Status: DC | PRN

## 2023-09-05 MED ORDER — HYDROMORPHONE HCL 1 MG/ML IJ SOLN
1.0000 mg | INTRAMUSCULAR | Status: DC | PRN
  Administered 2023-09-05 (×2): 1 mg via INTRAVENOUS
  Filled 2023-09-05 (×2): qty 1

## 2023-09-05 MED ORDER — HYDRALAZINE HCL 20 MG/ML IJ SOLN: 10.0000 mg | INTRAMUSCULAR | Status: DC | PRN

## 2023-09-05 MED ORDER — ONDANSETRON 4 MG PO TBDP: 4.0000 mg | ORAL_TABLET | Freq: Four times a day (QID) | ORAL | Status: DC | PRN

## 2023-09-05 MED ORDER — IOHEXOL 350 MG/ML SOLN
75.0000 mL | Freq: Once | INTRAVENOUS | Status: AC | PRN
  Administered 2023-09-05: 75 mL via INTRAVENOUS

## 2023-09-05 MED ORDER — ACETAMINOPHEN 500 MG PO TABS
1000.0000 mg | ORAL_TABLET | Freq: Four times a day (QID) | ORAL | Status: DC
  Administered 2023-09-06 – 2023-09-11 (×15): 1000 mg via ORAL
  Filled 2023-09-05 (×19): qty 2

## 2023-09-05 MED ORDER — ONDANSETRON HCL 4 MG/2ML IJ SOLN
4.0000 mg | Freq: Once | INTRAMUSCULAR | Status: AC
  Administered 2023-09-05: 4 mg via INTRAVENOUS
  Filled 2023-09-05: qty 2

## 2023-09-05 MED ORDER — DOCUSATE SODIUM 100 MG PO CAPS
100.0000 mg | ORAL_CAPSULE | Freq: Two times a day (BID) | ORAL | Status: DC
  Administered 2023-09-06 – 2023-09-11 (×11): 100 mg via ORAL
  Filled 2023-09-05 (×12): qty 1

## 2023-09-05 MED ORDER — BUPROPION HCL ER (XL) 150 MG PO TB24
450.0000 mg | ORAL_TABLET | Freq: Every day | ORAL | Status: DC
  Administered 2023-09-06 – 2023-09-11 (×6): 450 mg via ORAL
  Filled 2023-09-05 (×6): qty 3

## 2023-09-05 MED ORDER — POLYETHYLENE GLYCOL 3350 17 G PO PACK
17.0000 g | PACK | Freq: Every day | ORAL | Status: DC | PRN
  Administered 2023-09-08 – 2023-09-11 (×2): 17 g via ORAL
  Filled 2023-09-05 (×2): qty 1

## 2023-09-05 NOTE — ED Provider Notes (Incomplete)
 Patient is a 78 year old female with a history of scoliosis status post spinal fusion surgery with rods and a right hip replacement who is presenting today after a fall.  She tripped over a bag that she had moved into her path and fell hitting the floor.  Since falling she has not been able to stand or walk with significant pain in bilateral groin area but more pain in the left hip area.  No weakness or numbness.  Pulses are equal in both feet.  She denies head injury or loss of consciousness.  She takes no anticoagulation.  No pain in the upper extremities abdomen or chest.  Patient given further pain control.  Imaging is pending.  Concern for pelvic versus hip fracture.

## 2023-09-05 NOTE — ED Notes (Signed)
 Patient transported to CT

## 2023-09-05 NOTE — ED Triage Notes (Signed)
 Pt was moving a sewing machine in her home and tripped over it. She is unsure what part of he body hit the floor first She didn't hit her head, had no LOC.She is not on blood thinners. PT has complaints of bil hip pain and groin pain. She has had a total of 100mg  last 50mcg dose given at 1530. PT was place on 2L O2 for a initial O2 drop after meds but she arrives on room air. No shortening or rotation noted to bil legs.

## 2023-09-05 NOTE — Progress Notes (Signed)
 Transition of Care [facility]) - CAGE-AID Screening   Patient Details  Name: [name] MRN: [id number] Date of Birth: 1945/11/23  [facility]) CM/SW Contact:    Sallyanne MALVA Mettle, RN Phone Number: 09/05/2023, 9:15 PM   Clinical Narrative:  Reports 1-2 glasses of wine a night, states no s/s of withdrawal when she doesn't drink. Declines resources.  CAGE-AID Screening:    Have You Ever Felt You Ought to Cut Down on Your Drinking or Drug Use?: No Have People Annoyed You By Critizing Your Drinking Or Drug Use?: No Have You Felt Bad Or Guilty About Your Drinking Or Drug Use?: No Have You Ever Had a Drink or Used Drugs First Thing In The Morning to Steady Your Nerves or to Get Rid of a Hangover?: No CAGE-AID Score: 0  Substance Abuse Education Offered: No

## 2023-09-05 NOTE — Consult Note (Signed)
 Orthopedic Surgery Consult Note  Assessment: Patient is a 78 y.o. female with pelvic ring injury   Plan: -Operative plans: none -Okay for diet and DVT prophylaxis from ortho perspective -Weight bearing status: as tolerated -PT evaluate and treat -Pain control -Dispo: per primary  ___________________________________________________________________________   Reason for consult: Pelvic ring injury  History:  Patient is a 78 y.o. female who had a ground-level fall at her home earlier today.  She presented to the ER with pain in the hip and groin.  She has a history of a right hip replacement with Dr. Vernetta.  While in the ER, she was found to have bilateral inferior and superior rami fractures.  She was admitted to medicine.  She still reporting hip and groin pain.  No pain elsewhere.   Physical Exam:  General: no acute distress, appears stated age Neurologic: alert, answering questions appropriately, following commands Cardiovascular: regular rate, no cyanosis Respiratory: unlabored breathing on room air, symmetric chest rise Psychiatric: appropriate affect, normal cadence to speech  MSK:   -Bilateral upper extremities  No tenderness to palpation over extremity, no gross deformity, no open wounds Fires deltoid, biceps, triceps, wrist extensors, wrist flexors, finger extensors, finger flexors  AIN/PIN/IO intact  Palpable radial pulse  Sensation intact to light touch in median/ulnar/radial/axillary nerve distributions  Hand warm and well perfused  -Bilateral lower extremities  No tenderness to palpation over extremity, except around the hip.  No gross deformity, pain with logroll bilaterally Does not fire hip flexors due to pain.  Fires quadriceps, hamstrings, tibialis anterior, gastrocnemius and soleus, extensor hallucis longus Plantarflexes and dorsiflexes toes Sensation intact to light touch in sural, saphenous, tibial, deep peroneal, and superficial peroneal nerve  distributions Foot warm and well perfused  Imaging: CT of the pelvis from 09/05/2023 was independently reviewed and interpreted, showing bilateral inferior ramus fractures, right comminuted superior ramus fracture, possible nondisplaced right sacral ala fracture.  No other fracture seen.  No lucency seen around the total hip stem or cup.   Patient name: [name] Patient MRN: [id number] Date: 09/06/2023

## 2023-09-05 NOTE — ED Notes (Signed)
 Trauma Event Note    TRN to bedside for new admit, pending CT scans shortly pending transport. CAGEAID completed.  Last imported Vital Signs BP 133/84 (BP Location: Right Arm)   Pulse 74   Temp 97.7 F (36.5 C) (Oral)   Resp 19   Ht 5' 5 (1.651 m)   Wt 180 lb (81.6 kg)   SpO2 96%   BMI 29.95 kg/m   Trending CBC Recent Labs    09/05/23 1637  WBC 16.0*  HGB 13.1  HCT 42.0  PLT 187    Trending Coag's No results for input(s): APTT, INR in the last 72 hours.  Trending BMET Recent Labs    09/05/23 1637  NA 143  K 4.3  CL 115*  CO2 15*  BUN 15  CREATININE 0.82  GLUCOSE 90      [name] [name]  Trauma Response RN  Please call TRN at [phone] for further assistance.

## 2023-09-05 NOTE — Consult Note (Signed)
 Urology Consult   Reason for consult: possible bladder injury  History of Present Illness: [name] is a 78 y.o. F who presented to [facility] ED after falling at home. Imaging obtained showed pelvic ring injury with a possible hematoma adjacent to bladder on a non contrast study  Foley was placed by ER staff without issue. Urine draining clear yellow  Contrasted images with delayed films were obtained - there is no obvious extravasation although bladder is incompletely filled.   At the time of my exam, Ms [name] endorses continued pain.    Past Medical History:  Diagnosis Date   Anemia    Congenital musculoskeletal deformity of spine    Depression    Headache    Hypertension    Incontinence of bowel 09/2013   Lumbago    Lumbosacral spondylosis without myelopathy    Sleep apnea    Sleep disorder    uses gabapentin  at bedtime    Spinal stenosis, lumbar region, without neurogenic claudication     Past Surgical History:  Procedure Laterality Date   ANKLE SURGERY Right 2015   lengthened the achilles    ANTERIOR HIP REVISION Right 10/07/2020   Procedure: RIGHT HIP REVISION OF  HIP BALL/POLY-LINER;  Surgeon: Vernetta Lonni GRADE, MD;  Location: [facility] OR;  Service: Orthopedics;  Laterality: Right;  RNFA PLEASE   AUGMENTATION MAMMAPLASTY Bilateral 2000 or 2001 unsure    Left Implant has busted and is no longer visible   BACK SURGERY  2015   fusion , Dr Gust at [facility]; has 2 metal rods in place , fusion from t2 to s1    BACK SURGERY  01/2017   Dr Royden Gust ; repair of cracked titanium rod in back lumbar    BREAST EXCISIONAL BIOPSY Left    30 yrs ago   bunionectomy  Bilateral 1980s   multiple    COLONOSCOPY     with polypectomy ; q 5 years    FOOT SURGERY  07/2011   SHOULDER SURGERY Left 2013   rotator cuff    TOTAL HIP ARTHROPLASTY Right 05/08/2017   Procedure: RIGHT TOTAL HIP ARTHROPLASTY ANTERIOR APPROACH;  Surgeon: Ernie Cough, MD;  Location: [facility];   Service: Orthopedics;  Laterality: Right;  70 mins   TOTAL KNEE ARTHROPLASTY Right 06/06/2022   Procedure: RIGHT TOTAL KNEE ARTHROPLASTY;  Surgeon: Vernetta Lonni GRADE, MD;  Location: [facility] OR;  Service: Orthopedics;  Laterality: Right;     Current Hospital Medications:  Home meds:  No current facility-administered medications on file prior to encounter.   Current Outpatient Medications on File Prior to Encounter  Medication Sig Dispense Refill   buPROPion  (WELLBUTRIN  XL) 150 MG 24 hr tablet Take 450 mg by mouth daily.     celecoxib  (CELEBREX ) 200 MG capsule TAKE 1 CAPSULE (200 MG TOTAL) BY MOUTH 2 (TWO) TIMES DAILY BETWEEN MEALS AS NEEDED. 60 capsule 1   cetirizine (ZYRTEC) 10 MG tablet Take 10 mg by mouth daily as needed for allergies.     diphenhydrAMINE  (BENADRYL ) 50 MG capsule Take 50 mg by mouth at bedtime as needed for sleep.     docusate sodium  (COLACE) 50 MG capsule Take 50 mg by mouth daily.     gabapentin  (NEURONTIN ) 600 MG tablet Take 1 tablet (600 mg total) by mouth daily. 3 tabs QHS (Patient taking differently: Take 600 mg by mouth at bedtime.) 3 tablet 1   ibuprofen  (ADVIL ) 200 MG tablet Take 200 mg by mouth every 6 (six)  hours as needed for moderate pain.     losartan  (COZAAR ) 50 MG tablet Take 50 mg by mouth daily.     Menthol , Topical Analgesic, (BIOFREEZE) 10 % LIQD Apply 1 application  topically daily as needed (pain).     methocarbamol  (ROBAXIN ) 500 MG tablet Take 1 tablet (500 mg total) by mouth every 6 (six) hours as needed (pain, muscle spasms). 50 tablet 2   Naphazoline-Pheniramine (VISINE OP) Place 1 drop into both eyes daily as needed (allergies).     rosuvastatin  (CRESTOR ) 40 MG tablet Take 40 mg by mouth daily.     Vitamin D, Ergocalciferol, (DRISDOL) 50000 UNITS CAPS capsule Take 50,000 Units by mouth every Thursday.        Scheduled Meds: Continuous Infusions: PRN Meds:.HYDROmorphone  (DILAUDID ) injection  Allergies:  Allergies  Allergen Reactions    Lisinopril Cough    Family History  Problem Relation Age of Onset   Dementia Mother    Stroke Mother    Depression Mother    Thyroid  disease Mother    COPD Father    Cancer Brother    Breast cancer Neg Hx     Social History:  reports that she has quit smoking. She has never used smokeless tobacco. She reports current alcohol use of about 2.0 standard drinks of alcohol per week. She reports that she does not use drugs.  ROS: A complete review of systems was performed.  All systems are negative except for pertinent findings as noted.  Physical Exam:  Vital signs in last 24 hours: Temp:  [97.7 F (36.5 C)] 97.7 F (36.5 C) (08/27 1610) Pulse Rate:  [74-87] 87 (08/27 2148) Resp:  [19-20] 20 (08/27 2148) BP: (131-140)/(80-84) 140/80 (08/27 2148) SpO2:  [96 %-98 %] 96 % (08/27 2148) Weight:  [81.6 kg] 81.6 kg (08/27 1608) Constitutional:  Alert and oriented, No acute distress Cardiovascular: Regular rate and rhythm Respiratory: Normal respiratory effort, Lungs clear bilaterally GI: Abdomen is soft, nontender, nondistended, no abdominal masses Neurologic: Grossly intact, no focal deficits Psychiatric: Normal mood and affect  Laboratory Data:  Recent Labs    09/05/23 1637  WBC 16.0*  HGB 13.1  HCT 42.0  PLT 187    Recent Labs    09/05/23 1637  NA 143  K 4.3  CL 115*  GLUCOSE 90  BUN 15  CALCIUM  8.7*  CREATININE 0.82     Results for orders placed or performed during the hospital encounter of 09/05/23 (from the past 24 hours)  CBC with Differential/Platelet     Status: Abnormal   Collection Time: 09/05/23  4:37 PM  Result Value Ref Range   WBC 16.0 (H) 4.0 - 10.5 K/uL   RBC 4.17 3.87 - 5.11 MIL/uL   Hemoglobin 13.1 12.0 - 15.0 g/dL   HCT 57.9 63.9 - 53.9 %   MCV 100.7 (H) 80.0 - 100.0 fL   MCH 31.4 26.0 - 34.0 pg   MCHC 31.2 30.0 - 36.0 g/dL   RDW 86.6 88.4 - 84.4 %   Platelets 187 150 - 400 K/uL   nRBC 0.0 0.0 - 0.2 %   Neutrophils Relative % 86 %    Neutro Abs 13.7 (H) 1.7 - 7.7 K/uL   Lymphocytes Relative 7 %   Lymphs Abs 1.2 0.7 - 4.0 K/uL   Monocytes Relative 5 %   Monocytes Absolute 0.8 0.1 - 1.0 K/uL   Eosinophils Relative 1 %   Eosinophils Absolute 0.2 0.0 - 0.5 K/uL   Basophils Relative 0 %  Basophils Absolute 0.1 0.0 - 0.1 K/uL   Immature Granulocytes 1 %   Abs Immature Granulocytes 0.15 (H) 0.00 - 0.07 K/uL  Basic metabolic panel with GFR     Status: Abnormal   Collection Time: 09/05/23  4:37 PM  Result Value Ref Range   Sodium 143 135 - 145 mmol/L   Potassium 4.3 3.5 - 5.1 mmol/L   Chloride 115 (H) 98 - 111 mmol/L   CO2 15 (L) 22 - 32 mmol/L   Glucose, Bld 90 70 - 99 mg/dL   BUN 15 8 - 23 mg/dL   Creatinine, Ser 9.17 0.44 - 1.00 mg/dL   Calcium  8.7 (L) 8.9 - 10.3 mg/dL   GFR, Estimated >39 >39 mL/min   Anion gap 13 5 - 15   No results found for this or any previous visit (from the past 240 hours).  Renal Function: Recent Labs    09/05/23 1637  CREATININE 0.82   Estimated Creatinine Clearance: 61.6 mL/min (by C-G formula based on SCr of 0.82 mg/dL).  Radiologic Imaging: CT PELVIS WO CONTRAST Result Date: 09/05/2023 CLINICAL DATA:  pelvic fracture further eval EXAM: CT PELVIS WITHOUT CONTRAST TECHNIQUE: Multidetector CT imaging of the pelvis was performed following the standard protocol without intravenous contrast. RADIATION DOSE REDUCTION: This exam was performed according to the departmental dose-optimization program which includes automated exposure control, adjustment of the mA and/or kV according to patient size and/or use of iterative reconstruction technique. COMPARISON:  X-ray left and right hips 09/05/2023 FINDINGS: Urinary Tract:  No abnormality visualized. Bowel:  Unremarkable visualized pelvic bowel loops. Vascular/Lymphatic: No pathologically enlarged lymph nodes. No significant vascular abnormality seen. Reproductive: Coarsely calcified left uterine fibroid. Otherwise no mass or other significant  abnormality Other: Small hematoma formation along the space of Retzius anterior to the urinary bladder lumen (3:61). No intraperitoneal free gas. No organized fluid collection. Musculoskeletal: Loosely decreased bone density. Total right hip arthroplasty. No CT evidence of surgical hardware complication. Lumbosacral surgical hardware noted. Bilateral acute displaced inferior pubic rami fractures. Acute nondisplaced left superior rami fracture that extends to adjacent to the pubic symphysis. Acute displaced and comminuted right superior rami fracture. No pelvic diastasis. No proximal left femur dislocation or fracture. IMPRESSION: .: IMPRESSION: . 1. Small hematoma formation along the space of Retzius anterior to the urinary bladder lumen. Concern for extraperitoneal urinary bladder injury. Intraperitoneal injury is not fully excluded. Recommend CT urogram for further evaluation. 2.  Bilateral acute displaced inferior pubic rami fractures. 3. Acute nondisplaced left superior rami fracture. 4. Acute displaced and comminuted right superior rami fracture. 5. Uterine fibroid. Electronically Signed   By: Morgane  Naveau M.D.   On: 09/05/2023 18:39   DG Hip Unilat W or Wo Pelvis 2-3 Views Right Result Date: 09/05/2023 CLINICAL DATA:  fall pain EXAM: DG HIP (WITH OR WITHOUT PELVIS) 2-3V RIGHT; DG HIP (WITH OR WITHOUT PELVIS) 2-3V LEFT COMPARISON:  None Available. FINDINGS: Total right hip arthroplasty. No surgical hardware complication. No acute displaced fracture or dislocation of the left proximal femur. No pelvic diastasis. Acute displaced bilateral inferior and superior rami fractures. There is no evidence of arthropathy or other focal bone abnormality. Lumbosacral fusion surgical hardware noted. IMPRESSION: Acute displaced bilateral inferior and superior rami fractures. Electronically Signed   By: Morgane  Naveau M.D.   On: 09/05/2023 18:05   DG Hip Unilat W or Wo Pelvis 2-3 Views Left Result Date:  09/05/2023 CLINICAL DATA:  fall pain EXAM: DG HIP (WITH OR WITHOUT PELVIS) 2-3V RIGHT;  DG HIP (WITH OR WITHOUT PELVIS) 2-3V LEFT COMPARISON:  None Available. FINDINGS: Total right hip arthroplasty. No surgical hardware complication. No acute displaced fracture or dislocation of the left proximal femur. No pelvic diastasis. Acute displaced bilateral inferior and superior rami fractures. There is no evidence of arthropathy or other focal bone abnormality. Lumbosacral fusion surgical hardware noted. IMPRESSION: Acute displaced bilateral inferior and superior rami fractures. Electronically Signed   By: Morgane  Naveau M.D.   On: 09/05/2023 18:05    I independently reviewed the above imaging studies.  Impression/Recommendation: 78 yo F with pelvic fracture after fall. Foley in place draining clear yellow. Contrasted images show intact bladder wall without evidence of extrav although bladder not completely distended.   Discussed this with Ms Neace. Based on above, I do feel likelihood of bladder injury low although cannot say definitely with studies at hand. She requested no further imaging tonight due to pain which I feel is reasonable. I may order a formal CT cystogram tomorrow pending her clinical course  Herlene Foot 09/05/2023, 9:51 PM  [facility]  Pager: [phone]

## 2023-09-05 NOTE — H&P (Signed)
 [name] 1945/11/10  [id number].    HPI:  78 y/o F who presented to the ED after she experienced a fall. She tripped over a sewing machine and landed on the floor. She arrived in stable condition and was evaluated by the EDP. She underwent a CT pelvis that showed several pelvic fractures as well as fluid within the space of Retzius c/f possible bladder perforation. Trauma was consulted to evaluate.  On exam, patient is resting in bed. She is endorsing pelvic pain but denies other complaints.  She denies abdominal surgeries She has undergone 2 right hip replacement and a right knee arthoplasty.  She is not on AC  ROS: Review of Systems  Constitutional: Negative.   HENT: Negative.    Eyes: Negative.   Respiratory: Negative.    Cardiovascular: Negative.   Gastrointestinal: Negative.   Genitourinary: Negative.   Musculoskeletal:  Positive for back pain.  Skin: Negative.   Neurological: Negative.   Endo/Heme/Allergies: Negative.   Psychiatric/Behavioral: Negative.      Family History  Problem Relation Age of Onset   Dementia Mother    Stroke Mother    Depression Mother    Thyroid  disease Mother    COPD Father    Cancer Brother    Breast cancer Neg Hx     Past Medical History:  Diagnosis Date   Anemia    Congenital musculoskeletal deformity of spine    Depression    Headache    Hypertension    Incontinence of bowel 09/2013   Lumbago    Lumbosacral spondylosis without myelopathy    Sleep apnea    Sleep disorder    uses gabapentin  at bedtime    Spinal stenosis, lumbar region, without neurogenic claudication     Past Surgical History:  Procedure Laterality Date   ANKLE SURGERY Right 2015   lengthened the achilles    ANTERIOR HIP REVISION Right 10/07/2020   Procedure: RIGHT HIP REVISION OF  HIP BALL/POLY-LINER;  Surgeon: Vernetta Lonni GRADE, MD;  Location: [facility] OR;  Service: Orthopedics;  Laterality: Right;  RNFA PLEASE   AUGMENTATION MAMMAPLASTY  Bilateral 2000 or 2001 unsure    Left Implant has busted and is no longer visible   BACK SURGERY  2015   fusion , Dr Gust at [facility]; has 2 metal rods in place , fusion from t2 to s1    BACK SURGERY  01/2017   Dr Royden Gust ; repair of cracked titanium rod in back lumbar    BREAST EXCISIONAL BIOPSY Left    30 yrs ago   bunionectomy  Bilateral 1980s   multiple    COLONOSCOPY     with polypectomy ; q 5 years    FOOT SURGERY  07/2011   SHOULDER SURGERY Left 2013   rotator cuff    TOTAL HIP ARTHROPLASTY Right 05/08/2017   Procedure: RIGHT TOTAL HIP ARTHROPLASTY ANTERIOR APPROACH;  Surgeon: Ernie Cough, MD;  Location: [facility];  Service: Orthopedics;  Laterality: Right;  70 mins   TOTAL KNEE ARTHROPLASTY Right 06/06/2022   Procedure: RIGHT TOTAL KNEE ARTHROPLASTY;  Surgeon: Vernetta Lonni GRADE, MD;  Location: [facility];  Service: Orthopedics;  Laterality: Right;    Social History:  reports that she has quit smoking. She has never used smokeless tobacco. She reports current alcohol use of about 2.0 standard drinks of alcohol per week. She reports that she does not use drugs.  Allergies:  Allergies  Allergen Reactions   Lisinopril Cough    (  Not in a hospital admission)   Physical Exam: Blood pressure 133/84, pulse 74, temperature 97.7 F (36.5 C), temperature source Oral, resp. rate 19, height 5' 5 (1.651 m), weight 81.6 kg, SpO2 96%. Gen: female, NAD HEENT: atraumatic, trachea midline, no lacerations/abrasions Resp: equal chest rise, no deformity CV: RRR, HR 80s, MAP 100 Abd: soft, non-distended, non-tender, no ecchymosis Pelvis: TTP along the anterior pelvis Back: no stepoffs Ext: atraumatic Neuro: GCS 15  Results for orders placed or performed during the hospital encounter of 09/05/23 (from the past 48 hours)  CBC with Differential/Platelet     Status: Abnormal   Collection Time: 09/05/23  4:37 PM  Result Value Ref Range   WBC 16.0 (H) 4.0 - 10.5 K/uL   RBC  4.17 3.87 - 5.11 MIL/uL   Hemoglobin 13.1 12.0 - 15.0 g/dL   HCT 57.9 63.9 - 53.9 %   MCV 100.7 (H) 80.0 - 100.0 fL   MCH 31.4 26.0 - 34.0 pg   MCHC 31.2 30.0 - 36.0 g/dL   RDW 86.6 88.4 - 84.4 %   Platelets 187 150 - 400 K/uL   nRBC 0.0 0.0 - 0.2 %   Neutrophils Relative % 86 %   Neutro Abs 13.7 (H) 1.7 - 7.7 K/uL   Lymphocytes Relative 7 %   Lymphs Abs 1.2 0.7 - 4.0 K/uL   Monocytes Relative 5 %   Monocytes Absolute 0.8 0.1 - 1.0 K/uL   Eosinophils Relative 1 %   Eosinophils Absolute 0.2 0.0 - 0.5 K/uL   Basophils Relative 0 %   Basophils Absolute 0.1 0.0 - 0.1 K/uL   Immature Granulocytes 1 %   Abs Immature Granulocytes 0.15 (H) 0.00 - 0.07 K/uL    Comment: Performed at [facility], [address] [place], [place] [address]  Basic metabolic panel with GFR     Status: Abnormal   Collection Time: 09/05/23  4:37 PM  Result Value Ref Range   Sodium 143 135 - 145 mmol/L   Potassium 4.3 3.5 - 5.1 mmol/L   Chloride 115 (H) 98 - 111 mmol/L   CO2 15 (L) 22 - 32 mmol/L   Glucose, Bld 90 70 - 99 mg/dL    Comment: Glucose reference range applies only to samples taken after fasting for at least 8 hours.   BUN 15 8 - 23 mg/dL   Creatinine, Ser 9.17 0.44 - 1.00 mg/dL   Calcium  8.7 (L) 8.9 - 10.3 mg/dL   GFR, Estimated >39 >39 mL/min    Comment: (NOTE) Calculated using the CKD-EPI Creatinine Equation (2021)    Anion gap 13 5 - 15    Comment: Performed at [facility], [address] [place], [place] [address]   CT PELVIS WO CONTRAST Result Date: 09/05/2023 CLINICAL DATA:  pelvic fracture further eval EXAM: CT PELVIS WITHOUT CONTRAST TECHNIQUE: Multidetector CT imaging of the pelvis was performed following the standard protocol without intravenous contrast. RADIATION DOSE REDUCTION: This exam was performed according to the departmental dose-optimization program which includes automated exposure control, adjustment of the mA and/or kV according to patient size and/or use of  iterative reconstruction technique. COMPARISON:  X-ray left and right hips 09/05/2023 FINDINGS: Urinary Tract:  No abnormality visualized. Bowel:  Unremarkable visualized pelvic bowel loops. Vascular/Lymphatic: No pathologically enlarged lymph nodes. No significant vascular abnormality seen. Reproductive: Coarsely calcified left uterine fibroid. Otherwise no mass or other significant abnormality Other: Small hematoma formation along the space of Retzius anterior to the urinary  bladder lumen (3:61). No intraperitoneal free gas. No organized fluid collection. Musculoskeletal: Loosely decreased bone density. Total right hip arthroplasty. No CT evidence of surgical hardware complication. Lumbosacral surgical hardware noted. Bilateral acute displaced inferior pubic rami fractures. Acute nondisplaced left superior rami fracture that extends to adjacent to the pubic symphysis. Acute displaced and comminuted right superior rami fracture. No pelvic diastasis. No proximal left femur dislocation or fracture. IMPRESSION: .: IMPRESSION: . 1. Small hematoma formation along the space of Retzius anterior to the urinary bladder lumen. Concern for extraperitoneal urinary bladder injury. Intraperitoneal injury is not fully excluded. Recommend CT urogram for further evaluation. 2.  Bilateral acute displaced inferior pubic rami fractures. 3. Acute nondisplaced left superior rami fracture. 4. Acute displaced and comminuted right superior rami fracture. 5. Uterine fibroid. Electronically Signed   By: Morgane  Naveau M.D.   On: 09/05/2023 18:39   DG Hip Unilat W or Wo Pelvis 2-3 Views Right Result Date: 09/05/2023 CLINICAL DATA:  fall pain EXAM: DG HIP (WITH OR WITHOUT PELVIS) 2-3V RIGHT; DG HIP (WITH OR WITHOUT PELVIS) 2-3V LEFT COMPARISON:  None Available. FINDINGS: Total right hip arthroplasty. No surgical hardware complication. No acute displaced fracture or dislocation of the left proximal femur. No pelvic diastasis. Acute  displaced bilateral inferior and superior rami fractures. There is no evidence of arthropathy or other focal bone abnormality. Lumbosacral fusion surgical hardware noted. IMPRESSION: Acute displaced bilateral inferior and superior rami fractures. Electronically Signed   By: Morgane  Naveau M.D.   On: 09/05/2023 18:05   DG Hip Unilat W or Wo Pelvis 2-3 Views Left Result Date: 09/05/2023 CLINICAL DATA:  fall pain EXAM: DG HIP (WITH OR WITHOUT PELVIS) 2-3V RIGHT; DG HIP (WITH OR WITHOUT PELVIS) 2-3V LEFT COMPARISON:  None Available. FINDINGS: Total right hip arthroplasty. No surgical hardware complication. No acute displaced fracture or dislocation of the left proximal femur. No pelvic diastasis. Acute displaced bilateral inferior and superior rami fractures. There is no evidence of arthropathy or other focal bone abnormality. Lumbosacral fusion surgical hardware noted. IMPRESSION: Acute displaced bilateral inferior and superior rami fractures. Electronically Signed   By: Morgane  Naveau M.D.   On: 09/05/2023 18:05    Assessment/Plan 78 y/o F who presented after a FFS  Bilateral inferior and superior pelvic rami fx - Dr. Georgina consulted, non-op management, WB as tolerated, PT/OT ?Bladder injury - Urology consulted, foley to be placed, plan for CT urogram to determine need for surgery  FEN - NPO while awaiting scans VTE - Lovenox  ID - None Admit - Will plan to admit to trauma pending completion CT scans  Cordella DELENA Polly Marlis [facility] 09/05/2023, 9:17 PM Please see Amion for pager number during day hours 7:00am-4:30pm or 7:00am -11:30am on weekends

## 2023-09-05 NOTE — ED Provider Notes (Signed)
 [facility] Provider Note  CSN: [id number] Arrival date & time: 09/05/23 1558  Chief Complaint(s) Fall  HPI [name] is a 78 y.o. female with a past medical history of hypertension, osteoarthritis, scoliosis repair from T10-S1, and right hip and knee surgery who presents after a mechanical fall at home. She was moving a sewing machine when she tripped and fell to one side. She denies dizziness, headache, lightheadedness, loss of consciousness, nausea, or vomiting before or after the fall. She is not on blood thinners.  She did not strike her head and had no LOC. On arrival, she complained of bilateral hip pain and groin pain, worse on the left side. A pelvic binder was in place. She received a total of 100 mcg fentanyl  (last dose 50 mcg at 1530). She was briefly placed on 2L O? after an oxygen desaturation following pain medication, but now is stable on room air. She is unsure which body part hit the floor first. Exam revealed no shortening or rotation of bilateral legs.  Past Medical History Past Medical History:  Diagnosis Date   Anemia    Congenital musculoskeletal deformity of spine    Depression    Headache    Hypertension    Incontinence of bowel 09/2013   Lumbago    Lumbosacral spondylosis without myelopathy    Sleep apnea    Sleep disorder    uses gabapentin  at bedtime    Spinal stenosis, lumbar region, without neurogenic claudication    Patient Active Problem List   Diagnosis Date Noted   Closed pelvic ring fracture (HCC) 09/06/2023   Trauma 09/05/2023   Status post total right knee replacement 06/06/2022   Leg length discrepancy, Right 10/07/2020   History of revision of total replacement of right hip joint 10/07/2020   Multinodular goiter 05/18/2020   Hip contracture, unspecified laterality 07/02/2018   Overweight (BMI 25.0-29.9) 05/09/2017   S/P right THA, AA 05/08/2017   Chronic bilateral low back pain without  sciatica 04/05/2015   Disorder of sacroiliac joint 12/22/2014   Chronic low back pain 12/22/2014   Acquired scoliosis 11/27/2011   Lumbosacral spondylosis without myelopathy 05/22/2011   Home Medication(s) Prior to Admission medications   Medication Sig Start Date End Date Taking? Authorizing Provider  buPROPion  (WELLBUTRIN  XL) 150 MG 24 hr tablet Take 450 mg by mouth daily.   Yes [provider]  celecoxib  (CELEBREX ) 200 MG capsule TAKE 1 CAPSULE (200 MG TOTAL) BY MOUTH 2 (TWO) TIMES DAILY BETWEEN MEALS AS NEEDED. 05/12/22  Yes Vernetta Lonni GRADE, MD  cetirizine (ZYRTEC) 10 MG tablet Take 10 mg by mouth daily as needed for allergies.   Yes [provider]  diphenhydrAMINE  (BENADRYL ) 50 MG capsule Take 50 mg by mouth at bedtime as needed for sleep.   Yes [provider]  docusate sodium  (COLACE) 50 MG capsule Take 50 mg by mouth daily.   Yes [provider]  gabapentin  (NEURONTIN ) 300 MG capsule Take 900 mg by mouth at bedtime.   Yes [provider]  ibuprofen  (ADVIL ) 200 MG tablet Take 600 mg by mouth at bedtime as needed for moderate pain (pain score 4-6).   Yes [provider]  losartan  (COZAAR ) 50 MG tablet Take 50 mg by mouth daily.   Yes [provider]  Menthol , Topical Analgesic, (BIOFREEZE) 10 % LIQD Apply 1 application  topically daily as needed (pain).   Yes [provider]  Naphazoline-Pheniramine (VISINE OP) Place 1 drop  into both eyes daily as needed (allergies).   Yes [provider]  rosuvastatin  (CRESTOR ) 40 MG tablet Take 40 mg by mouth daily.   Yes [provider]  methocarbamol  (ROBAXIN ) 500 MG tablet Take 1 tablet (500 mg total) by mouth every 6 (six) hours as needed (pain, muscle spasms). Patient not taking: Reported on 09/05/2023 12/14/22   Georgina Ozell LABOR, MD                                                                                                                                     Past Surgical History Past Surgical History:  Procedure Laterality Date   ANKLE SURGERY Right 2015   lengthened the achilles    ANTERIOR HIP REVISION Right 10/07/2020   Procedure: RIGHT HIP REVISION OF  HIP BALL/POLY-LINER;  Surgeon: Vernetta Lonni GRADE, MD;  Location: [facility] OR;  Service: Orthopedics;  Laterality: Right;  RNFA PLEASE   AUGMENTATION MAMMAPLASTY Bilateral 2000 or 2001 unsure    Left Implant has busted and is no longer visible   BACK SURGERY  2015   fusion , Dr Gust at [facility]; has 2 metal rods in place , fusion from t2 to s1    BACK SURGERY  01/2017   Dr Royden Gust ; repair of cracked titanium rod in back lumbar    BREAST EXCISIONAL BIOPSY Left    30 yrs ago   bunionectomy  Bilateral 1980s   multiple    COLONOSCOPY     with polypectomy ; q 5 years    FOOT SURGERY  07/2011   SHOULDER SURGERY Left 2013   rotator cuff    TOTAL HIP ARTHROPLASTY Right 05/08/2017   Procedure: RIGHT TOTAL HIP ARTHROPLASTY ANTERIOR APPROACH;  Surgeon: Ernie Cough, MD;  Location: [facility] ORS;  Service: Orthopedics;  Laterality: Right;  70 mins   TOTAL KNEE ARTHROPLASTY Right 06/06/2022   Procedure: RIGHT TOTAL KNEE ARTHROPLASTY;  Surgeon: Vernetta Lonni GRADE, MD;  Location: [facility] OR;  Service: Orthopedics;  Laterality: Right;   Family History Family History  Problem Relation Age of Onset   Dementia Mother    Stroke Mother    Depression Mother    Thyroid  disease Mother    COPD Father    Cancer Brother    Breast cancer Neg Hx     Social History Social History   Tobacco Use   Smoking status: Former   Smokeless tobacco: Never   Tobacco comments:    smoked for ayear and half in college   Vaping Use   Vaping status: Never Used  Substance Use Topics   Alcohol use: Yes    Alcohol/week: 2.0 standard drinks of alcohol    Types: 2 Glasses of wine per week    Comment: daily   Drug use: Never   Allergies Lisinopril  Review of Systems A thorough review of systems  was obtained and all systems are negative except  as noted in the HPI and PMH.   Physical Exam Vital Signs  I have reviewed the triage vital signs BP 119/78 (BP Location: Right Arm)   Pulse 77   Temp 97.7 F (36.5 C) (Oral)   Resp 17   Ht 5' 5 (1.651 m)   Wt 81.6 kg   SpO2 91%   BMI 29.95 kg/m  General: Alert, no acute distress.  HEENT: No laceration, bruise Cardiac: Regular rate and rhythm, normal S1 and S2, Lungs: Normal respiratory effort. Abdomen: Non-distended, soft, non-tender Extremities: normal DP pulses and normal feet sensation Neurological: Alert and oriented 4. Cranial nerves II-XII grossly intact. No focal deficits. Skin: No rashes noted.    ED Results and Treatments Labs (all labs ordered are listed, but only abnormal results are displayed) Labs Reviewed  CBC WITH DIFFERENTIAL/PLATELET - Abnormal; Notable for the following components:      Result Value   WBC 16.0 (*)    MCV 100.7 (*)    Neutro Abs 13.7 (*)    Abs Immature Granulocytes 0.15 (*)    All other components within normal limits  BASIC METABOLIC PANEL WITH GFR - Abnormal; Notable for the following components:   Chloride 115 (*)    CO2 15 (*)    Calcium  8.7 (*)    All other components within normal limits  CBC - Abnormal; Notable for the following components:   WBC 12.2 (*)    All other components within normal limits  BASIC METABOLIC PANEL WITH GFR - Abnormal; Notable for the following components:   Glucose, Bld 117 (*)    Calcium  8.7 (*)    All other components within normal limits  CBC - Abnormal; Notable for the following components:   WBC 10.8 (*)    Hemoglobin 11.9 (*)    HCT 35.5 (*)    All other components within normal limits  BASIC METABOLIC PANEL WITH GFR - Abnormal; Notable for the following components:   Sodium 131 (*)    Chloride 96 (*)    Glucose, Bld 112 (*)    Calcium  8.5 (*)    All other components within normal limits  BASIC METABOLIC PANEL WITH GFR  CBC                                                                                                                           Radiology No results found.  Pertinent labs & imaging results that were available during my care of the patient were reviewed by me and considered in my medical decision making (see MDM for details).  Medications Ordered in ED Medications  acetaminophen  (TYLENOL ) tablet 1,000 mg (1,000 mg Oral Given 09/07/23 1835)  docusate sodium  (COLACE) capsule 100 mg (100 mg Oral Given 09/07/23 1127)  polyethylene glycol (MIRALAX  / GLYCOLAX ) packet 17 g (has no administration in time range)  ondansetron  (ZOFRAN -ODT) disintegrating tablet 4 mg (has no administration in time range)    Or  ondansetron  (ZOFRAN ) injection 4  mg (has no administration in time range)  metoprolol  tartrate (LOPRESSOR ) injection 5 mg (has no administration in time range)  hydrALAZINE  (APRESOLINE ) injection 10 mg (has no administration in time range)  rosuvastatin  (CRESTOR ) tablet 40 mg (40 mg Oral Given 09/07/23 1127)  buPROPion  (WELLBUTRIN  XL) 24 hr tablet 450 mg (450 mg Oral Given 09/07/23 1126)  Chlorhexidine  Gluconate Cloth 2 % PADS 6 each (6 each Topical Given 09/07/23 1127)  gabapentin  (NEURONTIN ) capsule 600 mg (600 mg Oral Given 09/06/23 2112)  methocarbamol  (ROBAXIN ) tablet 750 mg (750 mg Oral Given 09/07/23 1835)    Or  methocarbamol  (ROBAXIN ) injection 500 mg ( Intravenous See Alternative 09/07/23 1835)  enoxaparin  (LOVENOX ) injection 30 mg (30 mg Subcutaneous Given 09/07/23 1127)  0.9 %  sodium chloride  infusion ( Intravenous New Bag/Given 09/07/23 1129)  HYDROmorphone  (DILAUDID ) injection 1 mg (has no administration in time range)  oxyCODONE  (Oxy IR/ROXICODONE ) immediate release tablet 10 mg (has no administration in time range)  ibuprofen  (ADVIL ) tablet 400 mg (400 mg Oral Given 09/07/23 1511)  morphine  (PF) 4 MG/ML injection 4 mg (4 mg Intravenous Given 09/05/23 1716)  ondansetron  (ZOFRAN ) injection 4 mg (4 mg  Intravenous Given 09/05/23 1716)  HYDROmorphone  (DILAUDID ) injection 1 mg (1 mg Intravenous Given 09/05/23 1804)  iohexol  (OMNIPAQUE ) 350 MG/ML injection 75 mL (75 mLs Intravenous Contrast Given 09/05/23 2119)                                                                  Medical Decision Making / ED Course  Medical Decision Making:    [name] is a 78 y.o. female with a past medical history of hypertension, osteoarthritis, scoliosis repair from T10-S1, and right hip and knee surgery who presents after a mechanical fall at home. She was moving a sewing machine when she tripped and fell to one side. She denies dizziness, headache, lightheadedness, loss of consciousness, nausea, or vomiting before or after the fall. She is not on blood thinners.  #Fall, mechanical - Tripped over sewing machine, no prodrome, no head trauma, no LOC, not anticoagulated. #Hip/Groin Pain - Bilateral, worse on left; concerning for possible fracture despite no obvious shortening/rotation. #Bilateral inferior and superior pelvic rami fx - Dr. Georgina consulted, non-op management, WB as tolerated, PT/OT #?Bladder injury - Urology consulted, Dr. Lovie, foley placed, BUN/Cr remain normal.   CT pelvis: Small hematoma formation along the space of Retzius anterior to the urinary bladder lumen. Concern for extraperitoneal urinary bladder injury. Intraperitoneal injury is not fully excluded. Hip Xray: Acute displaced bilateral inferior and superior rami fractures.  #Osteoarthritis - Baseline condition, contributes to fall risk.   Reviewed and confirmed nursing documentation for past medical history, family history, social history.  Vital signs reviewed.   Clinical Course as of 09/07/23 2151  Wed Sep 05, 2023  1632 DG Hip Unilat W or Wo Pelvis 2-3 Views Right [MA]  1849 CT PELVIS WO CONTRAST [MA]  2233 CT CHEST ABDOMEN PELVIS W CONTRAST [MA]    Clinical Course User Index [MA] Bernadine Manos, MD   Additional  history obtained: -Additional history obtained from spouse  Lab Tests: -I ordered, reviewed, and interpreted labs.   The pertinent results include:   Labs Reviewed  CBC WITH DIFFERENTIAL/PLATELET - Abnormal; Notable for  the following components:      Result Value   WBC 16.0 (*)    MCV 100.7 (*)    Neutro Abs 13.7 (*)    Abs Immature Granulocytes 0.15 (*)    All other components within normal limits  BASIC METABOLIC PANEL WITH GFR - Abnormal; Notable for the following components:   Chloride 115 (*)    CO2 15 (*)    Calcium  8.7 (*)    All other components within normal limits  CBC - Abnormal; Notable for the following components:   WBC 12.2 (*)    All other components within normal limits  BASIC METABOLIC PANEL WITH GFR - Abnormal; Notable for the following components:   Glucose, Bld 117 (*)    Calcium  8.7 (*)    All other components within normal limits  CBC - Abnormal; Notable for the following components:   WBC 10.8 (*)    Hemoglobin 11.9 (*)    HCT 35.5 (*)    All other components within normal limits  BASIC METABOLIC PANEL WITH GFR - Abnormal; Notable for the following components:   Sodium 131 (*)    Chloride 96 (*)    Glucose, Bld 112 (*)    Calcium  8.5 (*)    All other components within normal limits  BASIC METABOLIC PANEL WITH GFR  CBC    Notable for  EKG   EKG Interpretation Date/Time:    Ventricular Rate:    PR Interval:    QRS Duration:    QT Interval:    QTC Calculation:   R Axis:      Text Interpretation:           Imaging Studies ordered: I ordered imaging studies including : [place] xray, Chest CT, Cervical CT, Head CT, Pelvis CT I independently visualized the following imaging with scope of interpretation limited to determining acute life threatening conditions related to emergency care; findings noted above I agree with the radiologist interpretation If any imaging was obtained with contrast I closely monitored patient for any possible  adverse reaction a/w contrast administration in the emergency department   Medicines ordered and prescription drug management: Meds ordered this encounter  Medications   morphine  (PF) 4 MG/ML injection 4 mg   ondansetron  (ZOFRAN ) injection 4 mg   HYDROmorphone  (DILAUDID ) injection 1 mg   DISCONTD: HYDROmorphone  (DILAUDID ) injection 1 mg   iohexol  (OMNIPAQUE ) 350 MG/ML injection 75 mL   acetaminophen  (TYLENOL ) tablet 1,000 mg   DISCONTD: methocarbamol  (ROBAXIN ) tablet 500 mg   DISCONTD: methocarbamol  (ROBAXIN ) injection 500 mg   docusate sodium  (COLACE) capsule 100 mg   polyethylene glycol (MIRALAX  / GLYCOLAX ) packet 17 g   OR Linked Order Group    ondansetron  (ZOFRAN -ODT) disintegrating tablet 4 mg    ondansetron  (ZOFRAN ) injection 4 mg   metoprolol  tartrate (LOPRESSOR ) injection 5 mg   hydrALAZINE  (APRESOLINE ) injection 10 mg   DISCONTD: oxyCODONE  (Oxy IR/ROXICODONE ) immediate release tablet 5 mg    Refill:  0   DISCONTD: HYDROmorphone  (DILAUDID ) injection 1 mg   rosuvastatin  (CRESTOR ) tablet 40 mg   buPROPion  (WELLBUTRIN  XL) 24 hr tablet 450 mg   Chlorhexidine  Gluconate Cloth 2 % PADS 6 each   gabapentin  (NEURONTIN ) capsule 600 mg   OR Linked Order Group    methocarbamol  (ROBAXIN ) tablet 750 mg    methocarbamol  (ROBAXIN ) injection 500 mg   enoxaparin  (LOVENOX ) injection 30 mg    Hold for Hgb < 8 or SBP < 90 given concerns of active bleeding  DISCONTD: HYDROmorphone  (DILAUDID ) injection 1 mg   DISCONTD: oxyCODONE  (Oxy IR/ROXICODONE ) immediate release tablet 5-10 mg    Refill:  0   0.9 %  sodium chloride  infusion   HYDROmorphone  (DILAUDID ) injection 1 mg   oxyCODONE  (Oxy IR/ROXICODONE ) immediate release tablet 10 mg    Refill:  0   ibuprofen  (ADVIL ) tablet 400 mg    -I have reviewed the patients home medicines and have made adjustments as needed   Consultations Obtained: I requested consultation with the orthopedic surgeon (Dr. Georgina), he will see the patient tomorrow  morning, per Dr. Georgina no procedure or diagnostic test needed for the patient, no extra instruction, chest pain management till tomorrow morning.  I consulted patient with Urology (Dr. Lovie) and, recommended CT cystogram and Foley catheter for patient  I consulted patient with General Surgery ( Trauma Service) and they recommended Head, Cervical ct before admitting to Trauma service.   Cardiac Monitoring: The patient was maintained on a cardiac monitor.  I personally viewed and interpreted the cardiac monitored which showed an underlying rhythm of: Normal sinus rhythm    Reevaluation: After the interventions noted above, I reevaluated the patient and found that they have stayed the same  Co morbidities that complicate the patient evaluation  Past Medical History:  Diagnosis Date   Anemia    Congenital musculoskeletal deformity of spine    Depression    Headache    Hypertension    Incontinence of bowel 09/2013   Lumbago    Lumbosacral spondylosis without myelopathy    Sleep apnea    Sleep disorder    uses gabapentin  at bedtime    Spinal stenosis, lumbar region, without neurogenic claudication       Dispostion: Disposition decision including need for hospitalization was considered, and patient admitted to the hospital.    Final Clinical Impression(s) / ED Diagnoses Final diagnoses:  Fall, initial encounter  Multiple closed fractures of pelvis without disruption of pelvic ring, initial encounter [facility])  Injury of bladder, initial encounter        Bernadine Manos, MD 09/07/23 2152    Doretha Folks, MD 09/07/23 2243

## 2023-09-06 ENCOUNTER — Inpatient Hospital Stay (HOSPITAL_COMMUNITY)

## 2023-09-06 DIAGNOSIS — S32810A Multiple fractures of pelvis with stable disruption of pelvic ring, initial encounter for closed fracture: Secondary | ICD-10-CM | POA: Diagnosis not present

## 2023-09-06 LAB — BASIC METABOLIC PANEL WITH GFR
Anion gap: 10 (ref 5–15)
BUN: 13 mg/dL (ref 8–23)
CO2: 26 mmol/L (ref 22–32)
Calcium: 8.7 mg/dL — ABNORMAL LOW (ref 8.9–10.3)
Chloride: 100 mmol/L (ref 98–111)
Creatinine, Ser: 0.81 mg/dL (ref 0.44–1.00)
GFR, Estimated: >60 mL/min (ref >60)
Glucose, Bld: 117 mg/dL — ABNORMAL HIGH (ref 70–99)
Potassium: 4.1 mmol/L (ref 3.5–5.1)
Sodium: 136 mmol/L (ref 135–145)

## 2023-09-06 LAB — CBC
HCT: 38.2 % (ref 36.0–46.0)
Hemoglobin: 12.7 g/dL (ref 12.0–15.0)
MCH: 30.1 pg (ref 26.0–34.0)
MCHC: 33.2 g/dL (ref 30.0–36.0)
MCV: 90.5 fL (ref 80.0–100.0)
Platelets: 218 K/uL (ref 150–400)
RBC: 4.22 MIL/uL (ref 3.87–5.11)
RDW: 13.2 % (ref 11.5–15.5)
WBC: 12.2 K/uL — ABNORMAL HIGH (ref 4.0–10.5)
nRBC: 0 % (ref 0.0–0.2)

## 2023-09-06 MED ORDER — METHOCARBAMOL 750 MG PO TABS
750.0000 mg | ORAL_TABLET | Freq: Four times a day (QID) | ORAL | Status: DC
  Administered 2023-09-06 – 2023-09-11 (×22): 750 mg via ORAL
  Filled 2023-09-06 (×22): qty 1

## 2023-09-06 MED ORDER — OXYCODONE HCL 5 MG PO TABS
5.0000 mg | ORAL_TABLET | ORAL | Status: DC | PRN
  Administered 2023-09-06 (×2): 10 mg via ORAL
  Filled 2023-09-06 (×2): qty 2

## 2023-09-06 MED ORDER — METHOCARBAMOL 1000 MG/10ML IJ SOLN
500.0000 mg | Freq: Four times a day (QID) | INTRAMUSCULAR | Status: DC
  Filled 2023-09-06: qty 10

## 2023-09-06 MED ORDER — HYDROMORPHONE HCL 1 MG/ML IJ SOLN
1.0000 mg | INTRAMUSCULAR | Status: DC | PRN
  Administered 2023-09-06 – 2023-09-07 (×2): 1 mg via INTRAVENOUS
  Filled 2023-09-06 (×2): qty 1

## 2023-09-06 MED ORDER — ENOXAPARIN SODIUM 30 MG/0.3ML IJ SOSY
30.0000 mg | PREFILLED_SYRINGE | Freq: Two times a day (BID) | INTRAMUSCULAR | Status: DC
  Administered 2023-09-06 – 2023-09-11 (×11): 30 mg via SUBCUTANEOUS
  Filled 2023-09-06 (×12): qty 0.3

## 2023-09-06 MED ORDER — GABAPENTIN 300 MG PO CAPS
600.0000 mg | ORAL_CAPSULE | Freq: Every day | ORAL | Status: DC
  Administered 2023-09-06 – 2023-09-10 (×5): 600 mg via ORAL
  Filled 2023-09-06 (×5): qty 2

## 2023-09-06 NOTE — Evaluation (Signed)
 Physical Therapy Evaluation Patient Details Name: [name] MRN: [id number] DOB: [birth year] Today's Date: 09/06/2023  History of Present Illness  Pt is 78 yo presenting [facility]/27 due to ground level fall at home. Pt found to have bil inferior/superior rami fractures. Currently WBAT.   PMH includes Rt TKA,HTN, depression, congenital musculoskeletal deformity of spine, OSA.  Clinical Impression  Pt  is presenting below baseline level of functioning. Currently pt is total A for bed mobility and unable to fully get to sitting EOB. Pt has increased levels of pain and is self limiting due to pain. Pt does not have good assistance at home due to spouse is elderly. Due to pt current functional status, home set up and available assistance at home recommending skilled physical therapy services < 3 hours/day in order to address strength, balance and functional mobility to decrease risk for falls, injury, immobility, skin break down and re-hospitalization.        If plan is discharge home, recommend the following: Two people to help with walking and/or transfers;Assistance with cooking/housework;Assist for transportation;Help with stairs or ramp for entrance   Can travel by private vehicle   No    Equipment Recommendations Wheelchair cushion (measurements PT);Wheelchair (measurements PT);Hoyer lift;Hospital bed     Functional Status Assessment Patient has had a recent decline in their functional status and demonstrates the ability to make significant improvements in function in a reasonable and predictable amount of time.     Precautions / Restrictions Precautions Precautions: Fall Recall of Precautions/Restrictions: Impaired Restrictions Weight Bearing Restrictions Per Provider Order: No Other Position/Activity Restrictions: WBAT      Mobility Bed Mobility Overal bed mobility: Needs Assistance Bed Mobility: Supine to Sit, Sit to Supine     Supine to sit: Total assist, HOB  elevated, Used rails Sit to supine: Total assist   General bed mobility comments: Multiple attempts at different variations of assisting pt to EOB. Initially actively resisting hip flexion due to pain. Pt able to move her RLE > LLE with very slow movements to get heels off bed. HOB elevated ~ 70 degrees, use of rails with step by step instructions for multiiple ways. Pt with increased pain; self limiting due to pain. Pt able to get to her R side with LE off EOB and HOB elevated to ~ 70 degrees. Attempted pushing up toward the L but pt stating she is unable with attempts.       Balance Overall balance assessment: History of Falls         Pertinent Vitals/Pain Pain Assessment Pain Assessment: 0-10 Pain Score: 7  Pain Location: bil groin Pain Descriptors / Indicators: Burning, Jabbing, Cramping, Spasm, Stabbing Pain Intervention(s): Limited activity within patient's tolerance, Monitored during session    Home Living Family/patient expects to be discharged to:: Private residence Living Arrangements: Spouse/significant other Available Help at Discharge: Family Type of Home: House Home Access: Stairs to enter   Secretary/administrator of Steps: 4   Home Layout: Full bath on main level;Two level Home Equipment: Adaptive equipment Additional Comments: walking stick    Prior Function Prior Level of Function : Independent/Modified Independent;Driving       Extremity/Trunk Assessment   Upper Extremity Assessment Upper Extremity Assessment: Defer to OT evaluation    Lower Extremity Assessment Lower Extremity Assessment: LLE deficits/detail;RLE deficits/detail RLE: Unable to fully assess due to pain LLE: Unable to fully assess due to pain    Cervical / Trunk Assessment Cervical / Trunk Assessment: Normal  Communication  Communication Communication: No apparent difficulties    Cognition Arousal: Lethargic, Alert Behavior During Therapy: WFL for tasks assessed/performed,  Anxious   PT - Cognitive impairments: No apparent impairments     Following commands: Impaired Following commands impaired: Follows one step commands with increased time     Cueing Cueing Techniques: Verbal cues, Tactile cues, Gestural cues, Visual cues            Assessment/Plan    PT Assessment Patient needs continued PT services  PT Problem List Decreased strength;Decreased activity tolerance;Decreased balance;Decreased mobility;Decreased knowledge of use of DME       PT Treatment Interventions DME instruction;Balance training;Gait training;Stair training;Functional mobility training;Therapeutic activities;Therapeutic exercise;Patient/family education;Wheelchair mobility training    PT Goals (Current goals can be found in the Care Plan section)  Acute Rehab PT Goals Patient Stated Goal: Pt would like to try to return home and wants to decrease pain PT Goal Formulation: With patient Time For Goal Achievement: 09/20/23 Potential to Achieve Goals: Fair    Frequency Min 2X/week        AM-PAC PT 6 Clicks Mobility  Outcome Measure Help needed turning from your back to your side while in a flat bed without using bedrails?: Total Help needed moving from lying on your back to sitting on the side of a flat bed without using bedrails?: Total Help needed moving to and from a bed to a chair (including a wheelchair)?: Total Help needed standing up from a chair using your arms (e.g., wheelchair or bedside chair)?: Total Help needed to walk in hospital room?: Total Help needed climbing 3-5 steps with a railing? : Total 6 Click Score: 6    End of Session   Activity Tolerance: Patient limited by pain Patient left: in bed;with call bell/phone within reach Nurse Communication: Mobility status PT Visit Diagnosis: Other abnormalities of gait and mobility (R26.89);History of falling (Z91.81);Muscle weakness (generalized) (M62.81);Pain Pain - Right/Left:  (bil) Pain - part of body:  Leg    Time: 1256-1336 PT Time Calculation (min) (ACUTE ONLY): 40 min   Charges:   PT Evaluation $PT Eval Low Complexity: 1 Low   PT General Charges $$ ACUTE PT VISIT: 1 Visit        Dorothyann Maier, DPT, CLT  Acute Rehabilitation Services Office: [phone] (Secure chat preferred)   Dorothyann VEAR Maier 09/06/2023, 1:59 PM

## 2023-09-06 NOTE — TOC CAGE-AID Note (Signed)
 Transition of Care [facility]) - CAGE-AID Screening   Patient Details  Name: [name] MRN: [id number] Date of Birth: [birth year]  [facility]) CM/SW Contact:    Vitalia Stough M, RN Phone Number: 09/06/2023, 3:04 PM   Clinical Narrative: Patient s/p GLF at home with mult pelvic fractures.  She admits to drinking 2 glasses nightly, but states she does not have an ETOH problem.  She denies need for SA resources.    CAGE-AID Screening:    Have You Ever Felt You Ought to Cut Down on Your Drinking or Drug Use?: No Have People Annoyed You By Critizing Your Drinking Or Drug Use?: No Have You Felt Bad Or Guilty About Your Drinking Or Drug Use?: No Have You Ever Had a Drink or Used Drugs First Thing In The Morning to Steady Your Nerves or to Get Rid of a Hangover?: No CAGE-AID Score: 0  Substance Abuse Education Offered: No  Mliss MICAEL Fass, RN, BSN  Trauma/Neuro ICU Case Manager [phone]

## 2023-09-06 NOTE — Evaluation (Signed)
 Occupational Therapy Evaluation Patient Details Name: [name] MRN: [id number] DOB: 03/02/1945 Today's Date: 09/06/2023   History of Present Illness   Pt is 78 yo presenting to [facility] 8/27 due to ground level fall at home. Pt found to have bil inferior/superior rami fractures. Currently WBAT.   PMH includes Rt TKA,HTN, depression, congenital musculoskeletal deformity of spine, OSA.     Clinical Impressions Pt presents with decline in function and safety with ADLs and ADL mobility with impaired strength, balance and endurance; is self limiting due to pain. PTA ot lived at home with spouse and was Ind with ADLs/selfcare, home mgt, drives, cooks and used walking stick for mobility. Pt currently requires total A for bed mobility and unable to fully get to sitting EOB. Pt has increased levels of pain and requires extensive assist with ADLs/selfcare at this time. OT will follow acutely to maximize level of function and safety     If plan is discharge home, recommend the following:   A lot of help with bathing/dressing/bathroom;Two people to help with walking and/or transfers;Assistance with cooking/housework;Assist for transportation;Help with stairs or ramp for entrance     Functional Status Assessment   Patient has had a recent decline in their functional status and demonstrates the ability to make significant improvements in function in a reasonable and predictable amount of time.     Equipment Recommendations   Other (comment) (defer)     Recommendations for Other Services         Precautions/Restrictions   Precautions Precautions: Fall Recall of Precautions/Restrictions: Impaired Restrictions Weight Bearing Restrictions Per Provider Order: No Other Position/Activity Restrictions: WBAT     Mobility Bed Mobility Overal bed mobility: Needs Assistance Bed Mobility: Supine to Sit, Sit to Supine     Supine to sit: Total assist, +2 for physical assistance, HOB  elevated, Used rails Sit to supine: Total assist, +2 for physical assistance   General bed mobility comments: Multiple attempts at different variations of assisting pt to EOB. Initially actively resisting hip flexion due to pain. Pt able to move her RLE > LLE with very slow movements to get heels off bed. HOB elevated ~ 70 degrees, use of rails with step by step instructions for multiiple ways. Pt with increased pain; self limiting due to pain. Pt able to get to her R side with LE off EOB and HOB elevated to ~ 70 degrees. Attempted pushing up toward the L but pt stating she is unable with attempts.    Transfers                          Balance Overall balance assessment: History of Falls                                         ADL either performed or assessed with clinical judgement   ADL Overall ADL's : Needs assistance/impaired Eating/Feeding: Set up;Sitting;Bed level   Grooming: Wash/dry hands;Wash/dry face;Set up;Supervision/safety;Bed level   Upper Body Bathing: Minimal assistance;Bed level   Lower Body Bathing: Total assistance   Upper Body Dressing : Minimal assistance;Bed level   Lower Body Dressing: Total assistance       Toileting- Clothing Manipulation and Hygiene: Total assistance;Bed level         General ADL Comments: limited by pain tolerance     Vision Baseline Vision/History: 1 Wears glasses  Ability to See in Adequate Light: 0 Adequate Patient Visual Report: No change from baseline       Perception         Praxis         Pertinent Vitals/Pain Pain Assessment Pain Assessment: 0-10 Pain Score: 7  Pain Location: B groin Pain Descriptors / Indicators: Burning, Jabbing, Cramping, Spasm, Stabbing Pain Intervention(s): Limited activity within patient's tolerance, Monitored during session, Premedicated before session, Repositioned     Extremity/Trunk Assessment Upper Extremity Assessment Upper Extremity Assessment:  Generalized weakness;Right hand dominant   Lower Extremity Assessment Lower Extremity Assessment: Defer to PT evaluation RLE: Unable to fully assess due to pain LLE: Unable to fully assess due to pain   Cervical / Trunk Assessment Cervical / Trunk Assessment: Normal   Communication Communication Communication: No apparent difficulties   Cognition Arousal: Lethargic, Alert Behavior During Therapy: WFL for tasks assessed/performed, Anxious                                 Following commands: Impaired Following commands impaired: Follows one step commands with increased time     Cueing  General Comments   Cueing Techniques: Verbal cues;Tactile cues;Gestural cues;Visual cues      Exercises     Shoulder Instructions      Home Living Family/patient expects to be discharged to:: Private residence Living Arrangements: Spouse/significant other Available Help at Discharge: Family Type of Home: House Home Access: Stairs to enter [employer] of Steps: 4   Home Layout: Full bath on main level;Two level     Bathroom Shower/Tub: Producer, television/film/video: Standard     Home Equipment: Mudlogger: Reacher;Long-handled Actor Comments: walking stick      Prior Functioning/Environment Prior Level of Function : Independent/Modified Independent;Driving                    OT Problem List: Decreased strength;Decreased activity tolerance;Decreased knowledge of use of DME or AE;Pain;Impaired balance (sitting and/or standing)   OT Treatment/Interventions: Self-care/ADL training;Patient/family education;Therapeutic exercise;Balance training;Therapeutic activities;DME and/or AE instruction      OT Goals(Current goals can be found in the care plan section)   Acute Rehab OT Goals Patient Stated Goal: less pain OT Goal Formulation: With patient Time For Goal Achievement: 09/20/23 Potential to Achieve  Goals: Good ADL Goals Pt Will Perform Grooming: with contact guard assist;with supervision;with set-up;sitting Pt Will Perform Upper Body Bathing: with contact guard assist;with supervision;sitting Pt Will Perform Lower Body Bathing: with max assist;with mod assist;sitting/lateral leans;with adaptive equipment Pt Will Perform Upper Body Dressing: with contact guard assist;with supervision;sitting Pt Will Transfer to Toilet: with max assist;with mod assist;stand pivot transfer;bedside commode Additional ADL Goal #1: Pt will complete bed mobility max - mod A to sit EOB in prep for ADL tasks   OT Frequency:  Min 2X/week    Co-evaluation PT/OT/SLP Co-Evaluation/Treatment: Yes Reason for Co-Treatment: For patient/therapist safety   OT goals addressed during session: ADL's and self-care;Proper use of Adaptive equipment and DME      AM-PAC OT 6 Clicks Daily Activity     Outcome Measure Help from another person eating meals?: None Help from another person taking care of personal grooming?: A Little Help from another person toileting, which includes using toliet, bedpan, or urinal?: Total Help from another person bathing (including washing, rinsing, drying)?: A Lot Help from another person to put on and taking  off regular upper body clothing?: A Little Help from another person to put on and taking off regular lower body clothing?: Total 6 Click Score: 14   End of Session Nurse Communication: Mobility status;Other (comment) (pain)  Activity Tolerance: Patient limited by pain Patient left: in bed;with call bell/phone within reach  OT Visit Diagnosis: Other abnormalities of gait and mobility (R26.89);History of falling (Z91.81);Pain;Muscle weakness (generalized) (M62.81) Pain - part of body:  (groin bilaterally)                Time: 8745-8663 OT Time Calculation (min): 42 min Charges:  OT General Charges $OT Visit: 1 Visit OT Evaluation $OT Eval Moderate Complexity: 1 Mod  Jacques Karna Loose 09/06/2023, 2:14 PM

## 2023-09-06 NOTE — Progress Notes (Signed)
 [facility] Progress Note     Subjective: CC:   Reports severe pain in her pelvis, states she cannot even roll over due to pain. Denies nausea or vomiting. Endorses flatus. At baseline she denies use of narcotics. States she takes 900 mg gabapentin  nightly so that she can relax and go to sleep, otherwise it takes 3-4 hours for her to calm down and for her mind to stop racing. No reported allergies to narcotics. At baseline she walks with a cane. States she lives at home with her husband who is 70 years old.    AFVSS, HR 80's, BP 109/70 WBC 12 from 16 Hgb stable 12.7 from 13.1 BUN 13/Cr 0.81  Objective: Vital signs in last 24 hours: Temp:  [97.7 F (36.5 C)-98.7 F (37.1 C)] 98.5 F (36.9 C) (08/28 0757) Pulse Rate:  [74-91] 85 (08/28 0757) Resp:  [16-20] 17 (08/28 0757) BP: (109-140)/(69-97) 109/70 (08/28 0757) SpO2:  [91 %-98 %] 91 % (08/28 0757) Weight:  [81.6 kg] 81.6 kg (08/27 1608)    Intake/Output from previous day: 08/27 0701 - 08/28 0700 In: 120 [P.O.:120] Out: 800 [Urine:800] Intake/Output this shift: No intake/output data recorded.  PE: Gen:  Alert, NAD, somnolent but arouses to voice easily Card:  Regular rate and rhythm, pedal pulses 2+ BL Pulm:  Normal effort ORA, clear to auscultation bilaterally Abd: Soft, non-tender, mild distention, bowel sounds present in all 4 quadrants, no HSM Skin: warm and dry, no rashes  GU: foley in place draining clear yellow urine (800 mL documented, ~200 mL in gravity bag) Psych: A&Ox3   Lab Results:  Recent Labs    09/05/23 1637 09/06/23 0446  WBC 16.0* 12.2*  HGB 13.1 12.7  HCT 42.0 38.2  PLT 187 218   BMET Recent Labs    09/05/23 1637 09/06/23 0446  NA 143 136  K 4.3 4.1  CL 115* 100  CO2 15* 26  GLUCOSE 90 117*  BUN 15 13  CREATININE 0.82 0.81  CALCIUM  8.7* 8.7*   PT/INR No results for input(s): LABPROT, INR in the last 72 hours. CMP     Component Value Date/Time   NA 136  09/06/2023 0446   K 4.1 09/06/2023 0446   CL 100 09/06/2023 0446   CO2 26 09/06/2023 0446   GLUCOSE 117 (H) 09/06/2023 0446   BUN 13 09/06/2023 0446   CREATININE 0.81 09/06/2023 0446   CALCIUM  8.7 (L) 09/06/2023 0446   PROT 6.4 (L) 05/25/2022 1424   ALBUMIN 3.9 05/25/2022 1424   AST 25 05/25/2022 1424   ALT 25 05/25/2022 1424   ALKPHOS 66 05/25/2022 1424   BILITOT 0.7 05/25/2022 1424   GFRNONAA >60 09/06/2023 0446   GFRAA >60 05/10/2017 0540   Lipase  No results found for: LIPASE     Studies/Results: CT Cervical Spine Wo Contrast Result Date: 09/05/2023 CLINICAL DATA:  Polytrauma, blunt EXAM: CT CERVICAL SPINE WITHOUT CONTRAST TECHNIQUE: Multidetector CT imaging of the cervical spine was performed without intravenous contrast. Multiplanar CT image reconstructions were also generated. RADIATION DOSE REDUCTION: This exam was performed according to the departmental dose-optimization program which includes automated exposure control, adjustment of the mA and/or kV according to patient size and/or use of iterative reconstruction technique. COMPARISON:  Ultrasound thyroid  neck 04/19/2021 FINDINGS: Alignment: Normal. Skull base and vertebrae: No acute fracture. No aggressive appearing focal osseous lesion or focal pathologic process. Soft tissues and spinal canal: No prevertebral fluid or swelling. No visible canal hematoma. Upper chest: Unremarkable. Other: Right  thyroid  gland 3.1 cm hypodense nodule. This has been evaluated on previous imaging. (ref: J Am Coll Radiol. 2015 Feb;12(2): 143-50).Atherosclerotic plaque of the aortic arch and its main branches. IMPRESSION: 1. No acute displaced fracture or traumatic listhesis of the cervical spine. 2.  Aortic Atherosclerosis (ICD10-I70.0). Electronically Signed   By: Morgane  Naveau M.D.   On: 09/05/2023 22:57   CT CHEST ABDOMEN PELVIS W CONTRAST Addendum Date: 09/05/2023 ADDENDUM REPORT: 09/05/2023 22:36 ADDENDUM: Delayed images of the bladder  were obtained after installation of contrast material into the bladder via Foley catheter using CT cystogram protocol. Images are technically limited due to motion artifact and streak artifact from the right hip prosthesis. However, as visualized, there is no evidence of contrast extravasation into the soft tissues suggesting no evidence of bladder rupture. Electronically Signed   By: Elsie Gravely M.D.   On: 09/05/2023 22:36   Result Date: 09/05/2023 CLINICAL DATA:  Poly trauma, blunt due to a fall. EXAM: CT CHEST, ABDOMEN, AND PELVIS WITH CONTRAST TECHNIQUE: Multidetector CT imaging of the chest, abdomen and pelvis was performed following the standard protocol during bolus administration of intravenous contrast. RADIATION DOSE REDUCTION: This exam was performed according to the departmental dose-optimization program which includes automated exposure control, adjustment of the mA and/or kV according to patient size and/or use of iterative reconstruction technique. CONTRAST:  75mL OMNIPAQUE  IOHEXOL  350 MG/ML SOLN COMPARISON:  CT pelvis 09/05/2023.  CT chest 01/27/2015 FINDINGS: CT CHEST FINDINGS Cardiovascular: Normal heart size. No pericardial effusions. Normal caliber thoracic aorta. No aortic dissection. Motion artifact in the aortic root. Great vessel origins are patent. Mediastinum/Nodes: 2.9 cm right thyroid  nodule. This is been previously evaluated by ultrasound on 10/30/2022. Esophagus is decompressed. No significant lymphadenopathy. No mediastinal hematoma or gas. Right breast prosthesis. Lungs/Pleura: Atelectasis or scarring in the lung bases. No airspace disease or consolidation. No pleural effusion or pneumothorax. Musculoskeletal: Degenerative changes in the spine and shoulders. Postoperative fixation from the midthoracic spine through the sacrum. No acute bony abnormalities. CT ABDOMEN PELVIS FINDINGS Hepatobiliary: No hepatic injury or perihepatic hematoma. Gallbladder is unremarkable. Pancreas:  Unremarkable. No pancreatic ductal dilatation or surrounding inflammatory changes. Spleen: No splenic injury or perisplenic hematoma. Adrenals/Urinary Tract: No adrenal gland nodules. Kidneys are symmetrical without focal laceration or hematoma. No hydronephrosis or hydroureter. Foley catheter decompresses the bladder, limiting evaluation. Stomach/Bowel: Stomach, small bowel, and colon are not abnormally distended. No wall thickening or stranding. No mesenteric hematoma or infiltration. Appendix is not identified. Vascular/Lymphatic: Aortic atherosclerosis. No enlarged abdominal or pelvic lymph nodes. Reproductive: Uterine calcifications consistent with fibroids. No abnormal adnexal masses. Other: No free air or free fluid in the abdomen. Stranding and edema anterior to the bladder likely representing postoperative changes. Visualization is limited due to streak artifact from the hip arthroplasty but likely represents hemorrhage or contusion related to pelvic fractures. Musculoskeletal: Comminuted displaced fractures of the superior and inferior right pubic ramus with nondisplaced fractures of the left superior and inferior pubic rami. Right total hip arthroplasty. Previous posterior fixation of the lumbosacral spine. IMPRESSION: 1. No acute posttraumatic changes in the chest. Atelectasis or scarring in the lung bases. 2. Comminuted fractures of the bilateral superior and inferior pubic rami with displacement of right pubic ramus fractures. 3. Infiltration adjacent to the right pelvic fractures and extending anterior to the bladder and superiorly along the rectus abdominus muscle. This is likely posttraumatic fluid in may represent contusion/hemorrhage or bladder rupture. 4. Bladder is decompressed with a Foley catheter limiting evaluation. 5. Calcified  uterine fibroid. 6. Aortic atherosclerosis. 7. No evidence of solid organ injury or bowel perforation. Electronically Signed: By: Elsie Gravely M.D. On:  09/05/2023 21:58   CT Head Wo Contrast Result Date: 09/05/2023 CLINICAL DATA:  Status post trauma. EXAM: CT HEAD WITHOUT CONTRAST TECHNIQUE: Contiguous axial images were obtained from the base of the skull through the vertex without intravenous contrast. RADIATION DOSE REDUCTION: This exam was performed according to the departmental dose-optimization program which includes automated exposure control, adjustment of the mA and/or kV according to patient size and/or use of iterative reconstruction technique. COMPARISON:  None Available. FINDINGS: Brain: There is generalized cerebral atrophy with widening of the extra-axial spaces and ventricular dilatation. There are areas of decreased attenuation within the white matter tracts of the supratentorial brain, consistent with microvascular disease changes. Vascular: No hyperdense vessel or unexpected calcification. Skull: Normal. Negative for fracture or focal lesion. Sinuses/Orbits: No acute finding. Other: None. IMPRESSION: 1. No acute intracranial abnormality. 2. Generalized cerebral atrophy and microvascular disease changes of the supratentorial brain. Electronically Signed   By: Suzen Dials M.D.   On: 09/05/2023 21:57   CT PELVIS WO CONTRAST Result Date: 09/05/2023 CLINICAL DATA:  pelvic fracture further eval EXAM: CT PELVIS WITHOUT CONTRAST TECHNIQUE: Multidetector CT imaging of the pelvis was performed following the standard protocol without intravenous contrast. RADIATION DOSE REDUCTION: This exam was performed according to the departmental dose-optimization program which includes automated exposure control, adjustment of the mA and/or kV according to patient size and/or use of iterative reconstruction technique. COMPARISON:  X-ray left and right hips 09/05/2023 FINDINGS: Urinary Tract:  No abnormality visualized. Bowel:  Unremarkable visualized pelvic bowel loops. Vascular/Lymphatic: No pathologically enlarged lymph nodes. No significant vascular  abnormality seen. Reproductive: Coarsely calcified left uterine fibroid. Otherwise no mass or other significant abnormality Other: Small hematoma formation along the space of Retzius anterior to the urinary bladder lumen (3:61). No intraperitoneal free gas. No organized fluid collection. Musculoskeletal: Loosely decreased bone density. Total right hip arthroplasty. No CT evidence of surgical hardware complication. Lumbosacral surgical hardware noted. Bilateral acute displaced inferior pubic rami fractures. Acute nondisplaced left superior rami fracture that extends to adjacent to the pubic symphysis. Acute displaced and comminuted right superior rami fracture. No pelvic diastasis. No proximal left femur dislocation or fracture. IMPRESSION: .: IMPRESSION: . 1. Small hematoma formation along the space of Retzius anterior to the urinary bladder lumen. Concern for extraperitoneal urinary bladder injury. Intraperitoneal injury is not fully excluded. Recommend CT urogram for further evaluation. 2.  Bilateral acute displaced inferior pubic rami fractures. 3. Acute nondisplaced left superior rami fracture. 4. Acute displaced and comminuted right superior rami fracture. 5. Uterine fibroid. Electronically Signed   By: Morgane  Naveau M.D.   On: 09/05/2023 18:39   DG Hip Unilat W or Wo Pelvis 2-3 Views Right Result Date: 09/05/2023 CLINICAL DATA:  fall pain EXAM: DG HIP (WITH OR WITHOUT PELVIS) 2-3V RIGHT; DG HIP (WITH OR WITHOUT PELVIS) 2-3V LEFT COMPARISON:  None Available. FINDINGS: Total right hip arthroplasty. No surgical hardware complication. No acute displaced fracture or dislocation of the left proximal femur. No pelvic diastasis. Acute displaced bilateral inferior and superior rami fractures. There is no evidence of arthropathy or other focal bone abnormality. Lumbosacral fusion surgical hardware noted. IMPRESSION: Acute displaced bilateral inferior and superior rami fractures. Electronically Signed   By: Morgane   Naveau M.D.   On: 09/05/2023 18:05   DG Hip Unilat W or Wo Pelvis 2-3 Views Left Result Date: 09/05/2023 CLINICAL DATA:  fall pain EXAM: DG HIP (WITH OR WITHOUT PELVIS) 2-3V RIGHT; DG HIP (WITH OR WITHOUT PELVIS) 2-3V LEFT COMPARISON:  None Available. FINDINGS: Total right hip arthroplasty. No surgical hardware complication. No acute displaced fracture or dislocation of the left proximal femur. No pelvic diastasis. Acute displaced bilateral inferior and superior rami fractures. There is no evidence of arthropathy or other focal bone abnormality. Lumbosacral fusion surgical hardware noted. IMPRESSION: Acute displaced bilateral inferior and superior rami fractures. Electronically Signed   By: Morgane  Naveau M.D.   On: 09/05/2023 18:05    Anti-infectives: Anti-infectives (From admission, onward)    None        Assessment/Plan  78 y/o F who presented after a fall from standing Bilateral inferior and superior pelvic rami fx - Dr. Georgina consulted, non-op management, WB as tolerated, PT/OT ?Bladder injury - Urology consulted, Dr. Lovie, foley placed (800 mL UOP documented), defer CT cysto to their team today. HTN - cozaar  at home, hold for now given possible bladder injury and normotensive  HLD - crestor  Home wellbutrin  ordered   FEN - Reg Pain - tylenol  1, 000 mg q 6h, increase robaxin  to 750 mg QID, oxy 5 mg q 4h PRN, resume home gabapentin  at night at reduced dose (600 mg nightly), IV dilaudid  for breakthrough.  VTE - Lovenox  Foley - placed 8/27 for possible bladder injury, removal per Urology ID - None Admit - med-surg, PT today after pain med adjustments, mgmt of foley/possible cysto per urology   LOS: 1 day   I reviewed nursing notes, Consultant ortho, urology notes, last 24 h vitals and pain scores, last 48 h intake and output, last 24 h labs and trends, and last 24 h imaging results.  This care required moderate level of medical decision making.   Almarie Pringle, PA-C [facility] Please see Amion for pager number during day hours 7:00am-4:30pm

## 2023-09-06 NOTE — TOC Initial Note (Signed)
 Transition of Care [facility]) - Initial/Assessment Note    Patient Details  Name: [name] MRN: [id number] Date of Birth: Aug 18, 1945  [facility]) CM/SW Contact:    Brandey Vandalen M, RN Phone Number: 09/06/2023, 3:06 PM  Clinical Narrative:                 Pt is 78 yo presenting to [facility] 8/27 due to ground level fall at home. Pt found to have bil inferior/superior rami fractures. Currently WBAT.  PTA, pt independent and living at home with her 29 yo husband.  She states that he cannot provide physical assistance at discharge.  PT/OT recommending SNF; will consult Trauma CSW for follow up. Will follow progress.   Expected Discharge Plan: Skilled Nursing Facility Barriers to Discharge: Continued Medical Work up              Expected Discharge Plan and Services   Discharge Planning Services: CM Consult   Living arrangements for the past 2 months: Single Family Home                                      Prior Living Arrangements/Services Living arrangements for the past 2 months: Single Family Home Lives with:: Spouse Patient language and need for interpreter reviewed:: Yes Do you feel safe going back to the place where you live?: Yes      Need for Family Participation in Patient Care: Yes (Comment) Care giver support system in place?: Yes (comment) Current home services: DME Criminal Activity/Legal Involvement Pertinent to Current Situation/Hospitalization: No - Comment as needed            Emotional Assessment Appearance:: Appears stated age Attitude/Demeanor/Rapport: Engaged Affect (typically observed): Accepting Orientation: : Oriented to Self, Oriented to Place, Oriented to  Time, Oriented to Situation      Admission diagnosis:  Trauma [T14.90XA] Patient Active Problem List   Diagnosis Date Noted   Closed pelvic ring fracture (HCC) 09/06/2023   Trauma 09/05/2023   Status post total right knee replacement 06/06/2022   Leg length  discrepancy, Right 10/07/2020   History of revision of total replacement of right hip joint 10/07/2020   Multinodular goiter 05/18/2020   Hip contracture, unspecified laterality 07/02/2018   Overweight (BMI 25.0-29.9) 05/09/2017   S/P right THA, AA 05/08/2017   Chronic bilateral low back pain without sciatica 04/05/2015   Disorder of sacroiliac joint 12/22/2014   Chronic low back pain 12/22/2014   Acquired scoliosis 11/27/2011   Lumbosacral spondylosis without myelopathy 05/22/2011   PCP:  Katina Pfeiffer, PA-C Pharmacy:   [facility] [phone] - [place], [place] - [address] [place] [address] Phone: [phone] Fax: [phone]     Social Drivers of Health (SDOH) Social History: SDOH Screenings   Food Insecurity: No Food Insecurity (06/06/2022)  Housing: Patient Declined (06/06/2022)  Transportation Needs: Unknown (06/06/2022)  Utilities: Patient Declined (06/06/2022)  Depression (PHQ2-9): Low Risk  (09/26/2021)  Tobacco Use: Medium Risk (09/05/2023)   SDOH Interventions:     Readmission Risk Interventions     No data to display         Mliss MICAEL Fass, RN, BSN  Trauma/Neuro ICU Case Manager [phone]

## 2023-09-06 NOTE — ED Notes (Signed)
 Pt complaining about alarms beeping on monitors. Stated that it is ridiculous and would like to speak with charge nurse regarding stopping alarm so that she could sleep. VS WNL on monitor. Pt alert and oriented x 4. No changes, RN paused monitor

## 2023-09-06 NOTE — Progress Notes (Signed)
 Pt sleeping on rounds. Foley in place draining clear yellow urine. Leukocytosis improving.  Preserved renal function. Afebrile. Urology will follow peripherally.

## 2023-09-07 LAB — CBC
HCT: 35.5 % — ABNORMAL LOW (ref 36.0–46.0)
Hemoglobin: 11.9 g/dL — ABNORMAL LOW (ref 12.0–15.0)
MCH: 29.8 pg (ref 26.0–34.0)
MCHC: 33.5 g/dL (ref 30.0–36.0)
MCV: 89 fL (ref 80.0–100.0)
Platelets: 189 K/uL (ref 150–400)
RBC: 3.99 MIL/uL (ref 3.87–5.11)
RDW: 13.2 % (ref 11.5–15.5)
WBC: 10.8 K/uL — ABNORMAL HIGH (ref 4.0–10.5)
nRBC: 0 % (ref 0.0–0.2)

## 2023-09-07 LAB — BASIC METABOLIC PANEL WITH GFR
Anion gap: 10 (ref 5–15)
BUN: 12 mg/dL (ref 8–23)
CO2: 25 mmol/L (ref 22–32)
Calcium: 8.5 mg/dL — ABNORMAL LOW (ref 8.9–10.3)
Chloride: 96 mmol/L — ABNORMAL LOW (ref 98–111)
Creatinine, Ser: 0.8 mg/dL (ref 0.44–1.00)
GFR, Estimated: >60 mL/min (ref >60)
Glucose, Bld: 112 mg/dL — ABNORMAL HIGH (ref 70–99)
Potassium: 3.6 mmol/L (ref 3.5–5.1)
Sodium: 131 mmol/L — ABNORMAL LOW (ref 135–145)

## 2023-09-07 MED ORDER — HYDROMORPHONE HCL 1 MG/ML IJ SOLN
1.0000 mg | INTRAMUSCULAR | Status: DC | PRN
  Administered 2023-09-08: 1 mg via INTRAVENOUS
  Filled 2023-09-07: qty 1

## 2023-09-07 MED ORDER — IBUPROFEN 200 MG PO TABS
400.0000 mg | ORAL_TABLET | Freq: Three times a day (TID) | ORAL | Status: DC
  Administered 2023-09-07 – 2023-09-11 (×13): 400 mg via ORAL
  Filled 2023-09-07 (×13): qty 2

## 2023-09-07 MED ORDER — SODIUM CHLORIDE 0.9 % IV SOLN: INTRAVENOUS | Status: AC

## 2023-09-07 MED ORDER — OXYCODONE HCL 5 MG PO TABS
10.0000 mg | ORAL_TABLET | ORAL | Status: DC | PRN
  Administered 2023-09-08 – 2023-09-11 (×9): 10 mg via ORAL
  Filled 2023-09-07 (×9): qty 2

## 2023-09-07 NOTE — Progress Notes (Signed)
 Physical Therapy Treatment Patient Details Name: [name] MRN: [id number] DOB: 10-30-1945 Today's Date: 09/07/2023   History of Present Illness Pt is 78 yo presenting to [facility] 8/27 due to ground level fall at home. Pt found to have bil inferior/superior rami fractures. Currently WBAT.   PMH includes Rt TKA,HTN, depression, congenital musculoskeletal deformity of spine, OSA.    PT Comments  Pt resting in bed on arrival, pleasant and eager for mobility and demonstrating slow but steady progress towards acute goals. Pt giving good effort for bed mobility training, able to come to sitting EOB with max A with increased time and cues for technique and pursed lip breathing. Pt able to maintain sitting up EOB with CGA for safety as pt with initial R lateral and posterior lean due to pain. Pt declining attempts at standing this session, however able to laterally scoot along EOB to [facility] with mod A at bed pad. Pt performing supine therex for increased ROM and verbalizing understanding of completion between therapies. Patient will benefit from continued inpatient follow up therapy, <3 hours/day, will continue to follow acutely.    If plan is discharge home, recommend the following: Two people to help with walking and/or transfers;Assistance with cooking/housework;Assist for transportation;Help with stairs or ramp for entrance   Can travel by private vehicle     No  Equipment Recommendations  Wheelchair cushion (measurements PT);Wheelchair (measurements PT);Hoyer lift;Hospital bed    Recommendations for Other Services       Precautions / Restrictions Precautions Precautions: Fall Recall of Precautions/Restrictions: Impaired Restrictions Weight Bearing Restrictions Per Provider Order: No Other Position/Activity Restrictions: WBAT     Mobility  Bed Mobility Overal bed mobility: Needs Assistance Bed Mobility: Supine to Sit, Sit to Supine     Supine to sit: HOB elevated, Used rails, Max  assist Sit to supine: Max assist, Used rails   General bed mobility comments: pt coming to sit on R EOb with increased time and cues for sequencing, max A to manage LEs to EOB and to elevate trunk, pt needing x2 attempts to attain midline due to pain    Transfers Overall transfer level: Needs assistance Equipment used: None Transfers: Bed to chair/wheelchair/BSC            Lateral/Scoot Transfers: Mod assist General transfer comment: laterally scooting along EOB with mod A towards HOB, pt declining attempts at standing    Ambulation/Gait               General Gait Details: unable at this time   Stairs             Wheelchair Mobility     Tilt Bed    Modified Rankin (Stroke Patients Only)       Balance Overall balance assessment: History of Falls, Needs assistance Sitting-balance support: Bilateral upper extremity supported, Feet supported Sitting balance-Leahy Scale: Fair Sitting balance - Comments: pt with initial R lateral and posterior lwan due to pain, improving with cues for breathing techniques and time seated up                                    Communication Communication Communication: No apparent difficulties  Cognition Arousal: Alert Behavior During Therapy: WFL for tasks assessed/performed, Anxious   PT - Cognitive impairments: No apparent impairments  Following commands: Impaired Following commands impaired: Follows one step commands with increased time    Cueing Cueing Techniques: Verbal cues, Tactile cues, Gestural cues, Visual cues  Exercises Other Exercises Other Exercises: AP, ecouraged x20/hr Other Exercises: long axis internal/external rotation x10 ea side    General Comments        Pertinent Vitals/Pain Pain Assessment Pain Assessment: Faces Faces Pain Scale: Hurts even more Pain Location: B groin Pain Descriptors / Indicators: Burning, Jabbing, Cramping, Spasm,  Stabbing Pain Intervention(s): Premedicated before session, Monitored during session, Limited activity within patient's tolerance    Home Living                          Prior Function            PT Goals (current goals can now be found in the care plan section) Acute Rehab PT Goals Patient Stated Goal: Pt would like to try to return home and wants to decrease pain PT Goal Formulation: With patient Time For Goal Achievement: 09/20/23 Progress towards PT goals: Progressing toward goals    Frequency    Min 2X/week      PT Plan      Co-evaluation              AM-PAC PT 6 Clicks Mobility   Outcome Measure  Help needed turning from your back to your side while in a flat bed without using bedrails?: Total Help needed moving from lying on your back to sitting on the side of a flat bed without using bedrails?: Total Help needed moving to and from a bed to a chair (including a wheelchair)?: Total Help needed standing up from a chair using your arms (e.g., wheelchair or bedside chair)?: Total Help needed to walk in hospital room?: Total Help needed climbing 3-5 steps with a railing? : Total 6 Click Score: 6    End of Session   Activity Tolerance: Patient limited by pain;Patient tolerated treatment well Patient left: in bed;with call bell/phone within reach;with family/visitor present Nurse Communication: Mobility status PT Visit Diagnosis: Other abnormalities of gait and mobility (R26.89);History of falling (Z91.81);Muscle weakness (generalized) (M62.81);Pain Pain - Right/Left:  (bil) Pain - part of body: Leg     Time: 8482-8463 PT Time Calculation (min) (ACUTE ONLY): 19 min  Charges:    $Therapeutic Activity: 8-22 mins PT General Charges $$ ACUTE PT VISIT: 1 Visit                     Shadiyah Wernli R. PTA Acute Rehabilitation Services Office: [phone]   Therisa CHRISTELLA Boor 09/07/2023, 3:46 PM

## 2023-09-07 NOTE — Progress Notes (Signed)
 [facility] Progress Note     Subjective: CC:  Was able to sleep last night, took gabapentin  and 10 mg oxy before bed. Worked with PT yesterday with great difficulty. States dilaudid  helps a lot with her pain but it does make her feel foggy and go to sleep, which she understands limits her ability to progress with PT.   Objective: Vital signs in last 24 hours: Temp:  [98.1 F (36.7 C)-99 F (37.2 C)] 98.1 F (36.7 C) (08/29 0911) Pulse Rate:  [85-94] 85 (08/29 0911) Resp:  [15-18] 18 (08/29 0911) BP: (107-123)/(57-72) 123/69 (08/29 0911) SpO2:  [85 %-89 %] 89 % (08/29 0911)    Intake/Output from previous day: 08/28 0701 - 08/29 0700 In: 360 [P.O.:360] Out: 1000 [Urine:1000] Intake/Output this shift: No intake/output data recorded.  PE: Gen:  Alert, NAD, eating breakfast Card:  Regular rate and rhythm, pedal pulses 2+ BL Pulm:  Normal effort ORA Abd: Soft, non-tender, mild distention, bowel sounds present in all 4 quadrants Skin: warm and dry, no rashes  GU: foley in place draining clear yellow urine (1,000 mL documented, RN at bedside emptying ~700 mL more) Psych: A&Ox3   Lab Results:  Recent Labs    09/06/23 0446 09/07/23 0500  WBC 12.2* 10.8*  HGB 12.7 11.9*  HCT 38.2 35.5*  PLT 218 189   BMET Recent Labs    09/06/23 0446 09/07/23 0500  NA 136 131*  K 4.1 3.6  CL 100 96*  CO2 26 25  GLUCOSE 117* 112*  BUN 13 12  CREATININE 0.81 0.80  CALCIUM  8.7* 8.5*   PT/INR No results for input(s): LABPROT, INR in the last 72 hours. CMP     Component Value Date/Time   NA 131 (L) 09/07/2023 0500   K 3.6 09/07/2023 0500   CL 96 (L) 09/07/2023 0500   CO2 25 09/07/2023 0500   GLUCOSE 112 (H) 09/07/2023 0500   BUN 12 09/07/2023 0500   CREATININE 0.80 09/07/2023 0500   CALCIUM  8.5 (L) 09/07/2023 0500   PROT 6.4 (L) 05/25/2022 1424   ALBUMIN 3.9 05/25/2022 1424   AST 25 05/25/2022 1424   ALT 25 05/25/2022 1424   ALKPHOS 66 05/25/2022 1424    BILITOT 0.7 05/25/2022 1424   GFRNONAA >60 09/07/2023 0500   GFRAA >60 05/10/2017 0540   Lipase  No results found for: LIPASE     Studies/Results: DG Pelvis Comp Min 3V Result Date: 09/06/2023 CLINICAL DATA:  Fall with multiple pelvic fracture EXAM: JUDET PELVIS - 3+ VIEW COMPARISON:  CT 09/05/2023, radiograph 09/05/2023 FINDINGS: Hardware in the lumbosacral spine. Right hip replacement with normal alignment. Acute displaced bilateral superior and inferior pubic rami fractures are again noted. Pubic symphysis appears grossly intact. IMPRESSION: Acute displaced bilateral superior and inferior pubic rami fractures. Electronically Signed   By: Luke Bun M.D.   On: 09/06/2023 18:38   CT Cervical Spine Wo Contrast Result Date: 09/05/2023 CLINICAL DATA:  Polytrauma, blunt EXAM: CT CERVICAL SPINE WITHOUT CONTRAST TECHNIQUE: Multidetector CT imaging of the cervical spine was performed without intravenous contrast. Multiplanar CT image reconstructions were also generated. RADIATION DOSE REDUCTION: This exam was performed according to the departmental dose-optimization program which includes automated exposure control, adjustment of the mA and/or kV according to patient size and/or use of iterative reconstruction technique. COMPARISON:  Ultrasound thyroid  neck 04/19/2021 FINDINGS: Alignment: Normal. Skull base and vertebrae: No acute fracture. No aggressive appearing focal osseous lesion or focal pathologic process. Soft tissues and spinal canal: No  prevertebral fluid or swelling. No visible canal hematoma. Upper chest: Unremarkable. Other: Right thyroid  gland 3.1 cm hypodense nodule. This has been evaluated on previous imaging. (ref: J Am Coll Radiol. 2015 Feb;12(2): 143-50).Atherosclerotic plaque of the aortic arch and its main branches. IMPRESSION: 1. No acute displaced fracture or traumatic listhesis of the cervical spine. 2.  Aortic Atherosclerosis (ICD10-I70.0). Electronically Signed   By:  Morgane  Naveau M.D.   On: 09/05/2023 22:57   CT CHEST ABDOMEN PELVIS W CONTRAST Addendum Date: 09/05/2023 ADDENDUM REPORT: 09/05/2023 22:36 ADDENDUM: Delayed images of the bladder were obtained after installation of contrast material into the bladder via Foley catheter using CT cystogram protocol. Images are technically limited due to motion artifact and streak artifact from the right hip prosthesis. However, as visualized, there is no evidence of contrast extravasation into the soft tissues suggesting no evidence of bladder rupture. Electronically Signed   By: Elsie Gravely M.D.   On: 09/05/2023 22:36   Result Date: 09/05/2023 CLINICAL DATA:  Poly trauma, blunt due to a fall. EXAM: CT CHEST, ABDOMEN, AND PELVIS WITH CONTRAST TECHNIQUE: Multidetector CT imaging of the chest, abdomen and pelvis was performed following the standard protocol during bolus administration of intravenous contrast. RADIATION DOSE REDUCTION: This exam was performed according to the departmental dose-optimization program which includes automated exposure control, adjustment of the mA and/or kV according to patient size and/or use of iterative reconstruction technique. CONTRAST:  75mL OMNIPAQUE  IOHEXOL  350 MG/ML SOLN COMPARISON:  CT pelvis 09/05/2023.  CT chest 01/27/2015 FINDINGS: CT CHEST FINDINGS Cardiovascular: Normal heart size. No pericardial effusions. Normal caliber thoracic aorta. No aortic dissection. Motion artifact in the aortic root. Great vessel origins are patent. Mediastinum/Nodes: 2.9 cm right thyroid  nodule. This is been previously evaluated by ultrasound on 10/30/2022. Esophagus is decompressed. No significant lymphadenopathy. No mediastinal hematoma or gas. Right breast prosthesis. Lungs/Pleura: Atelectasis or scarring in the lung bases. No airspace disease or consolidation. No pleural effusion or pneumothorax. Musculoskeletal: Degenerative changes in the spine and shoulders. Postoperative fixation from the  midthoracic spine through the sacrum. No acute bony abnormalities. CT ABDOMEN PELVIS FINDINGS Hepatobiliary: No hepatic injury or perihepatic hematoma. Gallbladder is unremarkable. Pancreas: Unremarkable. No pancreatic ductal dilatation or surrounding inflammatory changes. Spleen: No splenic injury or perisplenic hematoma. Adrenals/Urinary Tract: No adrenal gland nodules. Kidneys are symmetrical without focal laceration or hematoma. No hydronephrosis or hydroureter. Foley catheter decompresses the bladder, limiting evaluation. Stomach/Bowel: Stomach, small bowel, and colon are not abnormally distended. No wall thickening or stranding. No mesenteric hematoma or infiltration. Appendix is not identified. Vascular/Lymphatic: Aortic atherosclerosis. No enlarged abdominal or pelvic lymph nodes. Reproductive: Uterine calcifications consistent with fibroids. No abnormal adnexal masses. Other: No free air or free fluid in the abdomen. Stranding and edema anterior to the bladder likely representing postoperative changes. Visualization is limited due to streak artifact from the hip arthroplasty but likely represents hemorrhage or contusion related to pelvic fractures. Musculoskeletal: Comminuted displaced fractures of the superior and inferior right pubic ramus with nondisplaced fractures of the left superior and inferior pubic rami. Right total hip arthroplasty. Previous posterior fixation of the lumbosacral spine. IMPRESSION: 1. No acute posttraumatic changes in the chest. Atelectasis or scarring in the lung bases. 2. Comminuted fractures of the bilateral superior and inferior pubic rami with displacement of right pubic ramus fractures. 3. Infiltration adjacent to the right pelvic fractures and extending anterior to the bladder and superiorly along the rectus abdominus muscle. This is likely posttraumatic fluid in may represent contusion/hemorrhage or bladder  rupture. 4. Bladder is decompressed with a Foley catheter limiting  evaluation. 5. Calcified uterine fibroid. 6. Aortic atherosclerosis. 7. No evidence of solid organ injury or bowel perforation. Electronically Signed: By: Elsie Gravely M.D. On: 09/05/2023 21:58   CT Head Wo Contrast Result Date: 09/05/2023 CLINICAL DATA:  Status post trauma. EXAM: CT HEAD WITHOUT CONTRAST TECHNIQUE: Contiguous axial images were obtained from the base of the skull through the vertex without intravenous contrast. RADIATION DOSE REDUCTION: This exam was performed according to the departmental dose-optimization program which includes automated exposure control, adjustment of the mA and/or kV according to patient size and/or use of iterative reconstruction technique. COMPARISON:  None Available. FINDINGS: Brain: There is generalized cerebral atrophy with widening of the extra-axial spaces and ventricular dilatation. There are areas of decreased attenuation within the white matter tracts of the supratentorial brain, consistent with microvascular disease changes. Vascular: No hyperdense vessel or unexpected calcification. Skull: Normal. Negative for fracture or focal lesion. Sinuses/Orbits: No acute finding. Other: None. IMPRESSION: 1. No acute intracranial abnormality. 2. Generalized cerebral atrophy and microvascular disease changes of the supratentorial brain. Electronically Signed   By: Suzen Dials M.D.   On: 09/05/2023 21:57   CT PELVIS WO CONTRAST Result Date: 09/05/2023 CLINICAL DATA:  pelvic fracture further eval EXAM: CT PELVIS WITHOUT CONTRAST TECHNIQUE: Multidetector CT imaging of the pelvis was performed following the standard protocol without intravenous contrast. RADIATION DOSE REDUCTION: This exam was performed according to the departmental dose-optimization program which includes automated exposure control, adjustment of the mA and/or kV according to patient size and/or use of iterative reconstruction technique. COMPARISON:  X-ray left and right hips 09/05/2023 FINDINGS:  Urinary Tract:  No abnormality visualized. Bowel:  Unremarkable visualized pelvic bowel loops. Vascular/Lymphatic: No pathologically enlarged lymph nodes. No significant vascular abnormality seen. Reproductive: Coarsely calcified left uterine fibroid. Otherwise no mass or other significant abnormality Other: Small hematoma formation along the space of Retzius anterior to the urinary bladder lumen (3:61). No intraperitoneal free gas. No organized fluid collection. Musculoskeletal: Loosely decreased bone density. Total right hip arthroplasty. No CT evidence of surgical hardware complication. Lumbosacral surgical hardware noted. Bilateral acute displaced inferior pubic rami fractures. Acute nondisplaced left superior rami fracture that extends to adjacent to the pubic symphysis. Acute displaced and comminuted right superior rami fracture. No pelvic diastasis. No proximal left femur dislocation or fracture. IMPRESSION: .: IMPRESSION: . 1. Small hematoma formation along the space of Retzius anterior to the urinary bladder lumen. Concern for extraperitoneal urinary bladder injury. Intraperitoneal injury is not fully excluded. Recommend CT urogram for further evaluation. 2.  Bilateral acute displaced inferior pubic rami fractures. 3. Acute nondisplaced left superior rami fracture. 4. Acute displaced and comminuted right superior rami fracture. 5. Uterine fibroid. Electronically Signed   By: Morgane  Naveau M.D.   On: 09/05/2023 18:39   DG Hip Unilat W or Wo Pelvis 2-3 Views Right Result Date: 09/05/2023 CLINICAL DATA:  fall pain EXAM: DG HIP (WITH OR WITHOUT PELVIS) 2-3V RIGHT; DG HIP (WITH OR WITHOUT PELVIS) 2-3V LEFT COMPARISON:  None Available. FINDINGS: Total right hip arthroplasty. No surgical hardware complication. No acute displaced fracture or dislocation of the left proximal femur. No pelvic diastasis. Acute displaced bilateral inferior and superior rami fractures. There is no evidence of arthropathy or other  focal bone abnormality. Lumbosacral fusion surgical hardware noted. IMPRESSION: Acute displaced bilateral inferior and superior rami fractures. Electronically Signed   By: Morgane  Naveau M.D.   On: 09/05/2023 18:05   DG Hip  Unilat W or Wo Pelvis 2-3 Views Left Result Date: 09/05/2023 CLINICAL DATA:  fall pain EXAM: DG HIP (WITH OR WITHOUT PELVIS) 2-3V RIGHT; DG HIP (WITH OR WITHOUT PELVIS) 2-3V LEFT COMPARISON:  None Available. FINDINGS: Total right hip arthroplasty. No surgical hardware complication. No acute displaced fracture or dislocation of the left proximal femur. No pelvic diastasis. Acute displaced bilateral inferior and superior rami fractures. There is no evidence of arthropathy or other focal bone abnormality. Lumbosacral fusion surgical hardware noted. IMPRESSION: Acute displaced bilateral inferior and superior rami fractures. Electronically Signed   By: Morgane  Naveau M.D.   On: 09/05/2023 18:05    Anti-infectives: Anti-infectives (From admission, onward)    None        Assessment/Plan  78 y/o F who presented after a fall from standing Bilateral inferior and superior pelvic rami fx - Dr. Georgina consulted, non-op management, WB as tolerated, PT/OT ?Bladder injury - Urology consulted, Dr. Lovie, foley placed, BUN/Cr remain normal. Urine is clear and yellow, defer CT cysto to urology. If no plans for repeat imaging, likely plan TOV tomorrow 8/30. HTN - cozaar  at home, hold for now, she is normotensive HLD - crestor  Home wellbutrin  ordered   FEN - Reg, hyponatremia (Na 131 from 136), start NS at 50 mL/hr, may need salt tabs as well Pain - tylenol  1, 000 mg q 6h (patient has been refusing, we discussed and she is willing to take it), add low dose advil  today 400 mg TID, increased robaxin  to 750 mg QID 8/28, oxy 10 mg q 4h PRN, home gabapentin  at night at reduced dose (600 mg nightly), IV dilaudid  for breakthrough only  VTE - Lovenox  Foley - placed 8/27 for possible bladder  injury, removal per Urology ID - None Admit - med-surg, PT/OT currently recommending SNF mgmt of foley/possible cysto per urology    LOS: 2 days   I reviewed nursing notes, Consultant ortho, urology notes, last 24 h vitals and pain scores, last 48 h intake and output, last 24 h labs and trends, and last 24 h imaging results.  This care required moderate level of medical decision making.   Almarie Pringle, PA-C [facility] Please see Amion for pager number during day hours 7:00am-4:30pm

## 2023-09-07 NOTE — TOC Progression Note (Addendum)
 Transition of Care [facility]) - Progression Note    Patient Details  Name: [name] MRN: [id number] Date of Birth: 05-22-1945  [facility]) CM/SW Contact  Carmelita FORBES Carbon, LCSW Phone Number: 09/07/2023, 9:24 AM  Clinical Narrative:    CSW spoke with patient regarding SNF rec. Patient is agreeable to SNF for STR. Patient has been to STR in the past, but cannot recall which she went to.   11:40- Met with patient at bedside, provided bed offers with Medicare.gov ratings. Patient states she will review with her husband today and make a choice.   Expected Discharge Plan: Skilled Nursing Facility Barriers to Discharge: Continued Medical Work up               Expected Discharge Plan and Services   Discharge Planning Services: CM Consult   Living arrangements for the past 2 months: Single Family Home                                       Social Drivers of Health (SDOH) Interventions SDOH Screenings   Food Insecurity: No Food Insecurity (06/06/2022)  Housing: Patient Declined (06/06/2022)  Transportation Needs: Unknown (06/06/2022)  Utilities: Patient Declined (06/06/2022)  Depression (PHQ2-9): Low Risk  (09/26/2021)  Tobacco Use: Medium Risk (09/05/2023)    Readmission Risk Interventions     No data to display

## 2023-09-07 NOTE — Plan of Care (Signed)

## 2023-09-07 NOTE — NC FL2 (Signed)
 [place]  MEDICAID FL2 LEVEL OF CARE FORM     IDENTIFICATION  Patient Name: [name] Birthdate: 1945/02/22 Sex: female Admission Date (Current Location): 09/05/2023  [facility] and [place] Number:  Producer, television/film/video and Address:  [facility], [address] [place], [place] [address]      Provider Number: [id number]  Attending Physician Name and Address:  Md, Trauma, MD  Relative Name and Phone Number:  Fathima, Bartl (Spouse)  [phone] (Mobile)    Current Level of Care: Hospital Recommended Level of Care: Skilled Nursing Facility Prior Approval Number:    Date Approved/Denied:   PASRR Number: [id number]  Discharge Plan:      Current Diagnoses: Patient Active Problem List   Diagnosis Date Noted   Closed pelvic ring fracture (HCC) 09/06/2023   Trauma 09/05/2023   Status post total right knee replacement 06/06/2022   Leg length discrepancy, Right 10/07/2020   History of revision of total replacement of right hip joint 10/07/2020   Multinodular goiter 05/18/2020   Hip contracture, unspecified laterality 07/02/2018   Overweight (BMI 25.0-29.9) 05/09/2017   S/P right THA, AA 05/08/2017   Chronic bilateral low back pain without sciatica 04/05/2015   Disorder of sacroiliac joint 12/22/2014   Chronic low back pain 12/22/2014   Acquired scoliosis 11/27/2011   Lumbosacral spondylosis without myelopathy 05/22/2011    Orientation RESPIRATION BLADDER Height & Weight     Self, Time, Situation, Place  Normal Indwelling catheter Weight: 180 lb (81.6 kg) Height:  5' 5 (165.1 cm)  BEHAVIORAL SYMPTOMS/MOOD NEUROLOGICAL BOWEL NUTRITION STATUS      Continent Diet (reg)  AMBULATORY STATUS COMMUNICATION OF NEEDS Skin   Extensive Assist Verbally Bruising                       Personal Care Assistance Level of Assistance  Bathing, Feeding, Dressing Bathing Assistance: Maximum assistance Feeding assistance: Limited assistance Dressing  Assistance: Maximum assistance     Functional Limitations Info             SPECIAL CARE FACTORS FREQUENCY  OT (By licensed OT), PT (By licensed PT)     PT Frequency: 5 times per week OT Frequency: 5 times per week            Contractures      Additional Factors Info  Code Status, Allergies Code Status Info: full Allergies Info: Lisinopril           Current Medications (09/07/2023):  This is the current hospital active medication list Current Facility-Administered Medications  Medication Dose Route Frequency Provider Last Rate Last Admin   0.9 %  sodium chloride  infusion   Intravenous Continuous Simaan, Elizabeth S, PA-C       acetaminophen  (TYLENOL ) tablet 1,000 mg  1,000 mg Oral Q6H Polly Cordella LABOR, MD   1,000 mg at 09/06/23 9442   buPROPion  (WELLBUTRIN  XL) 24 hr tablet 450 mg  450 mg Oral Daily Polly Cordella LABOR, MD   450 mg at 09/06/23 0913   Chlorhexidine  Gluconate Cloth 2 % PADS 6 each  6 each Topical Daily Polly Cordella LABOR, MD   6 each at 09/06/23 9081   docusate sodium  (COLACE) capsule 100 mg  100 mg Oral BID Polly Cordella LABOR, MD   100 mg at 09/06/23 2112   enoxaparin  (LOVENOX ) injection 30 mg  30 mg Subcutaneous BID Augustus Almarie RAMAN, PA-C   30 mg at 09/06/23 2114   gabapentin  (NEURONTIN ) capsule 600  mg  600 mg Oral QHS Augustus Almarie RAMAN, PA-C   600 mg at 09/06/23 2112   hydrALAZINE  (APRESOLINE ) injection 10 mg  10 mg Intravenous Q2H PRN Polly Cordella LABOR, MD       HYDROmorphone  (DILAUDID ) injection 1 mg  1 mg Intravenous Q3H PRN Simaan, Elizabeth S, PA-C   1 mg at 09/07/23 9384   methocarbamol  (ROBAXIN ) tablet 750 mg  750 mg Oral QID Simaan, Elizabeth S, PA-C   750 mg at 09/06/23 2112   Or   methocarbamol  (ROBAXIN ) injection 500 mg  500 mg Intravenous QID Augustus Almarie RAMAN, PA-C       metoprolol  tartrate (LOPRESSOR ) injection 5 mg  5 mg Intravenous Q6H PRN Polly Cordella LABOR, MD       ondansetron  (ZOFRAN -ODT) disintegrating tablet 4 mg  4 mg Oral  Q6H PRN Polly Cordella LABOR, MD       Or   ondansetron  (ZOFRAN ) injection 4 mg  4 mg Intravenous Q6H PRN Polly Cordella LABOR, MD       oxyCODONE  (Oxy IR/ROXICODONE ) immediate release tablet 5-10 mg  5-10 mg Oral Q4H PRN Simaan, Elizabeth S, PA-C   10 mg at 09/06/23 2358   polyethylene glycol (MIRALAX  / GLYCOLAX ) packet 17 g  17 g Oral Daily PRN Polly Cordella LABOR, MD       rosuvastatin  (CRESTOR ) tablet 40 mg  40 mg Oral Daily Polly Cordella LABOR, MD   40 mg at 09/06/23 9086     Discharge Medications: Please see discharge summary for a list of discharge medications.  Relevant Imaging Results:  Relevant Lab Results:   Additional Information SS #: [id number]  Dametrius Sanjuan E Ankita Newcomer, LCSW

## 2023-09-07 NOTE — Progress Notes (Signed)
 Patient comfortably sleeping this morning. She said she has had difficulty sleeping the last two nights due to pain so I let her continue to sleep. The plan is unchanged from an orthopedic perspective. She can continue to weight bear as tolerated. Work with PT. No operative plans at this time.   Ozell DELENA Ada, MD Orthopedic Surgeon

## 2023-09-07 NOTE — Care Management Important Message (Signed)
 Important Message  Patient Details  Name: [name] MRN: [id number] Date of Birth: 25-Dec-1945   Important Message Given:  Yes - Medicare IM     Jon Cruel 09/07/2023, 4:35 PM

## 2023-09-08 LAB — CBC
HCT: 32.3 % — ABNORMAL LOW (ref 36.0–46.0)
Hemoglobin: 10.9 g/dL — ABNORMAL LOW (ref 12.0–15.0)
MCH: 29.9 pg (ref 26.0–34.0)
MCHC: 33.7 g/dL (ref 30.0–36.0)
MCV: 88.7 fL (ref 80.0–100.0)
Platelets: 163 K/uL (ref 150–400)
RBC: 3.64 MIL/uL — ABNORMAL LOW (ref 3.87–5.11)
RDW: 13.2 % (ref 11.5–15.5)
WBC: 8.8 K/uL (ref 4.0–10.5)
nRBC: 0 % (ref 0.0–0.2)

## 2023-09-08 LAB — BASIC METABOLIC PANEL WITH GFR
Anion gap: 9 (ref 5–15)
BUN: 8 mg/dL (ref 8–23)
CO2: 22 mmol/L (ref 22–32)
Calcium: 7.6 mg/dL — ABNORMAL LOW (ref 8.9–10.3)
Chloride: 108 mmol/L (ref 98–111)
Creatinine, Ser: 0.66 mg/dL (ref 0.44–1.00)
GFR, Estimated: >60 mL/min (ref >60)
Glucose, Bld: 93 mg/dL (ref 70–99)
Potassium: 3.3 mmol/L — ABNORMAL LOW (ref 3.5–5.1)
Sodium: 139 mmol/L (ref 135–145)

## 2023-09-08 MED ORDER — NITROFURANTOIN MONOHYD MACRO 100 MG PO CAPS
100.0000 mg | ORAL_CAPSULE | Freq: Every day | ORAL | Status: DC
  Administered 2023-09-08 – 2023-09-10 (×3): 100 mg via ORAL
  Filled 2023-09-08 (×4): qty 1

## 2023-09-08 MED ORDER — DIPHENHYDRAMINE HCL 50 MG/ML IJ SOLN
12.5000 mg | Freq: Four times a day (QID) | INTRAMUSCULAR | Status: DC | PRN
  Administered 2023-09-08 – 2023-09-09 (×2): 12.5 mg via INTRAVENOUS
  Filled 2023-09-08 (×2): qty 1

## 2023-09-08 MED ORDER — BISACODYL 10 MG RE SUPP
10.0000 mg | Freq: Once | RECTAL | Status: AC
  Administered 2023-09-08: 10 mg via RECTAL
  Filled 2023-09-08: qty 1

## 2023-09-08 NOTE — Plan of Care (Signed)

## 2023-09-08 NOTE — Progress Notes (Signed)
 [facility] Progress Note     Subjective: CC:  Feeling better.  Able to move a bit better  Objective: Vital signs in last 24 hours: Temp:  [97.5 F (36.4 C)-98.5 F (36.9 C)] 98.2 F (36.8 C) (08/30 0820) Pulse Rate:  [73-77] 73 (08/30 0820) Resp:  [16-19] 19 (08/30 0820) BP: (114-134)/(64-83) 134/80 (08/30 0820) SpO2:  [89 %-94 %] 94 % (08/30 0820) Last BM Date : 09/05/23  Intake/Output from previous day: 08/29 0701 - 08/30 0700 In: 240 [P.O.:240] Out: 2300 [Urine:2300] Intake/Output this shift: No intake/output data recorded.  PE: Gen:  Alert, NAD, eating breakfast Abd: Soft, non-tender Skin: warm and dry, no rashes  GU: foley in place draining clear yellow urine  Psych: A&Ox3   Lab Results:  Recent Labs    09/07/23 0500 09/08/23 0639  WBC 10.8* 8.8  HGB 11.9* 10.9*  HCT 35.5* 32.3*  PLT 189 163   BMET Recent Labs    09/07/23 0500 09/08/23 0639  NA 131* 139  K 3.6 3.3*  CL 96* 108  CO2 25 22  GLUCOSE 112* 93  BUN 12 8  CREATININE 0.80 0.66  CALCIUM  8.5* 7.6*   PT/INR No results for input(s): LABPROT, INR in the last 72 hours. CMP     Component Value Date/Time   NA 139 09/08/2023 0639   K 3.3 (L) 09/08/2023 0639   CL 108 09/08/2023 0639   CO2 22 09/08/2023 0639   GLUCOSE 93 09/08/2023 0639   BUN 8 09/08/2023 0639   CREATININE 0.66 09/08/2023 0639   CALCIUM  7.6 (L) 09/08/2023 0639   PROT 6.4 (L) 05/25/2022 1424   ALBUMIN 3.9 05/25/2022 1424   AST 25 05/25/2022 1424   ALT 25 05/25/2022 1424   ALKPHOS 66 05/25/2022 1424   BILITOT 0.7 05/25/2022 1424   GFRNONAA >60 09/08/2023 0639   GFRAA >60 05/10/2017 0540   Lipase  No results found for: LIPASE     Studies/Results: DG Pelvis Comp Min 3V Result Date: 09/06/2023 CLINICAL DATA:  Fall with multiple pelvic fracture EXAM: JUDET PELVIS - 3+ VIEW COMPARISON:  CT 09/05/2023, radiograph 09/05/2023 FINDINGS: Hardware in the lumbosacral spine. Right hip replacement with  normal alignment. Acute displaced bilateral superior and inferior pubic rami fractures are again noted. Pubic symphysis appears grossly intact. IMPRESSION: Acute displaced bilateral superior and inferior pubic rami fractures. Electronically Signed   By: Luke Bun M.D.   On: 09/06/2023 18:38    Anti-infectives: Anti-infectives (From admission, onward)    Start     Dose/Rate Route Frequency Ordered Stop   09/08/23 2200  nitrofurantoin  (macrocrystal-monohydrate) (MACROBID ) capsule 100 mg        100 mg Oral Daily at bedtime 09/08/23 1050          Assessment/Plan  78 y/o F who presented after a fall from standing Bilateral inferior and superior pelvic rami fx - Dr. Georgina consulted, non-op management, WB as tolerated, PT/OT ?Bladder injury - Urology consulted, Dr. Lovie, foley placed, BUN/Cr remain normal. Urine is clear and yellow, Pt does not want to repeat CT scan at this time.  Discussed with Dr Nieves.  Will leave foley in place for 10 days and then do voiding trial back in office.  Will give Macrobid  100mg  at bedtime for UTI prophylaxis HTN - cozaar  at home, hold for now, she is normotensive HLD - crestor  Home wellbutrin  ordered   FEN - Reg, hyponatremia (Na 131 from 136), start NS at 50 mL/hr, may need salt tabs  as well Pain - tylenol  1,000 mg q 6h, low dose advil  today 400 mg TID, robaxin  to 750 mg QID 8/28, oxy 10 mg q 4h PRN, home gabapentin  at night at reduced dose (600 mg nightly), IV dilaudid  for breakthrough only  VTE - Lovenox  Foley - placed 8/27 for possible bladder injury, removal per Urology ID - None Admit - med-surg, PT/OT currently recommending SNF Medically stable for d/c once bed available   LOS: 3 days   I reviewed nursing notes, Consultant ortho, urology notes, last 24 h vitals and pain scores, last 48 h intake and output, last 24 h labs and trends, and last 24 h imaging results.  This care required moderate level of medical decision making.   Bernarda JAYSON Ned, MD  Colorectal and General Surgery [facility]

## 2023-09-08 NOTE — Progress Notes (Signed)
 S: Pt without complaints.   O: Vitals:   09/08/23 0406 09/08/23 0820  BP: 128/83 134/80  Pulse: 77 73  Resp: 16 19  Temp: 98.5 F (36.9 C) 98.2 F (36.8 C)  SpO2: 94% 94%   PE: NAD, alert, watching TV Foley in place, urine clear   Intake/Output Summary (Last 24 hours) at 09/08/2023 1036 Last data filed at 09/08/2023 0511 Gross per 24 hour  Intake 240 ml  Output 2300 ml  Net -2060 ml   Lab Results  Component Value Date   CREATININE 0.66 09/08/2023   CREATININE 0.80 09/07/2023   CREATININE 0.81 09/06/2023   CT images and delayed images reviewed   A/P: Fall, pelvic fxs, hematoma in space of retzius and air in bladder at this area on delayed CT images (no sagittal images to compare, not a formal CT cystogram) -  -pt refuse CT cystogram twice - discussed with her again CT cystogram vs foley and she would like to keep the foley.  -continue foley for 10 days and void trial in office - f/u message sent.

## 2023-09-08 NOTE — Progress Notes (Signed)
 Physical Therapy Treatment Patient Details Name: [name] MRN: [id number] DOB: May 18, 1945 Today's Date: 09/08/2023   History of Present Illness Pt is 78 yo presenting to [facility] 8/27 due to ground level fall at home. Pt found to have bil inferior/superior rami fractures. Currently WBAT. PMH includes Rt TKA,HTN, depression, congenital musculoskeletal deformity of spine, OSA.    PT Comments  Pt received in supine, preparing to roll with RN present to get off bed pan and for hygiene assist, pt agreeable to PTA assist for safety/comfort and working on transfers when done. Pt performs functional mobility tasks with up to +2 maxA, not able to progress to standing with platform lift when attempted due to pain but did tolerate sitting EOB a few mins. Plan to work on lateral or posterior scooting OOB to chair next session pending pain tolerance vs hoyer OOB to chair with pressure relief cushion for comfort. Patient will benefit from continued inpatient follow up therapy, <3 hours/day.    If plan is discharge home, recommend the following: Two people to help with walking and/or transfers;Assistance with cooking/housework;Assist for transportation;Help with stairs or ramp for entrance   Can travel by private vehicle     No  Equipment Recommendations  Wheelchair cushion (measurements PT);Wheelchair (measurements PT);Hoyer lift;Hospital bed    Recommendations for Other Services       Precautions / Restrictions Precautions Precautions: Fall Recall of Precautions/Restrictions: Impaired Restrictions Weight Bearing Restrictions Per Provider Order: No Other Position/Activity Restrictions: WBAT     Mobility  Bed Mobility Overal bed mobility: Needs Assistance Bed Mobility: Sit to Supine, Rolling, Sidelying to Sit Rolling: +2 for safety/equipment, Used rails, Mod assist, Min assist Sidelying to sit: Mod assist, +2 for physical assistance, Used rails   Sit to supine: Max assist, Used rails, +2  for physical assistance, +2 for safety/equipment   General bed mobility comments: RN already in room when PTA arrived to prepare her to roll off bed pan, used towels between her knees for comfort when rolling to her L side and RN providing posterior peri-care in sidelying, then up to +2 modA for sidelying to EOB, with max encouragement and cues for pursed-lip breathing. Pt tolerated sitting EOB <5 mins, then requesting return to supine due to pain. BLE and trunk assist for pt safety/comfort returning to supine and totalA +2 to scoot toward HOB in supine.    Transfers Overall transfer level: Needs assistance                Lateral/Scoot Transfers: Mod assist, Max assist, +2 physical assistance General transfer comment: attempt for sit>stand with Stedy platform for pt safety/comfort but pt placed hands on Stedy rail, then defers, c/o pain. Pt attempted x1 lateral scoot to her R/L sides but limited effort/tolerance due to increase in pain, then request return to supine    Ambulation/Gait               General Gait Details: unable to stand at this time   Stairs             Wheelchair Mobility     Tilt Bed    Modified Rankin (Stroke Patients Only)       Balance Overall balance assessment: History of Falls, Needs assistance Sitting-balance support: Bilateral upper extremity supported, Feet supported Sitting balance-Leahy Scale: Poor Sitting balance - Comments: poor unsupported; posterior bias, heavy use of BUE to offload due to pain, slight L lean.  Communication Communication Communication: No apparent difficulties  Cognition Arousal: Alert Behavior During Therapy: WFL for tasks assessed/performed, Anxious, Lability   PT - Cognitive impairments: No apparent impairments                         Following commands: Impaired Following commands impaired: Follows one step commands with increased time     Cueing Cueing Techniques: Verbal cues, Tactile cues, Gestural cues, Visual cues  Exercises Other Exercises Other Exercises: AP, encouraged x10-20/hr    General Comments        Pertinent Vitals/Pain Pain Assessment Pain Assessment: Faces Faces Pain Scale: Hurts whole lot Pain Location: B groin and bottom Pain Descriptors / Indicators: Burning, Jabbing, Cramping, Spasm, Stabbing, Moaning, Crying Pain Intervention(s): Limited activity within patient's tolerance, Monitored during session, Premedicated before session, Repositioned, Ice applied    Home Living                          Prior Function            PT Goals (current goals can now be found in the care plan section) Acute Rehab PT Goals Patient Stated Goal: Pt would like to try to return home and wants to decrease pain PT Goal Formulation: With patient Time For Goal Achievement: 09/20/23 Progress towards PT goals: Progressing toward goals    Frequency    Min 2X/week      PT Plan      Co-evaluation              AM-PAC PT 6 Clicks Mobility   Outcome Measure  Help needed turning from your back to your side while in a flat bed without using bedrails?: Total Help needed moving from lying on your back to sitting on the side of a flat bed without using bedrails?: Total Help needed moving to and from a bed to a chair (including a wheelchair)?: Total Help needed standing up from a chair using your arms (e.g., wheelchair or bedside chair)?: Total Help needed to walk in hospital room?: Total Help needed climbing 3-5 steps with a railing? : Total 6 Click Score: 6    End of Session Equipment Utilized During Treatment: Gait belt Activity Tolerance: Patient limited by pain Patient left: in bed;with call bell/phone within reach;with bed alarm set;Other (comment) (pt heels floated; awaiting RN to bring ice pack for hip) Nurse Communication: Mobility status;Other (comment) (pt requesting ice pack; RN  agreeable to bring her one.) PT Visit Diagnosis: Other abnormalities of gait and mobility (R26.89);History of falling (Z91.81);Muscle weakness (generalized) (M62.81);Pain Pain - Right/Left:  (bil) Pain - part of body: Hip (bottom/groin)     Time: 8468-8454 PT Time Calculation (min) (ACUTE ONLY): 14 min  Charges:    $Therapeutic Activity: 8-22 mins PT General Charges $$ ACUTE PT VISIT: 1 Visit                     Shawndale Kilpatrick P., PTA Acute Rehabilitation Services Secure Chat Preferred 9a-5:30pm Office: [phone]    [name] 09/08/2023, 5:48 PM

## 2023-09-09 NOTE — Progress Notes (Signed)
   09/09/23 1045  Mobility  Activity  (Bedpan)  Level of Assistance Minimal assist, patient does 75% or more  Assistive Device Other (Comment) (Bedrails)  Range of Motion/Exercises Active;Right leg;Left leg  Activity Response Tolerated fair  Mobility Referral Yes  Mobility visit 1 Mobility  Mobility Specialist Start Time (ACUTE ONLY) 1045  Mobility Specialist Stop Time (ACUTE ONLY) 1102  Mobility Specialist Time Calculation (min) (ACUTE ONLY) 17 min   Mobility Specialist: Progress Note  Pt agreeable to mobility session - assisted NT with CHG bath - received in bed. C/o being in a lot of pain, BLE in hips, inner thigh and pelvic area. Pt able to lift hips off bed with min assist/ Deferred further mobility d.t pain. Pt states she was unable to get to [facility] this morning. Returned to bed with all needs met - call bell within reach. NT present.   Virgle Boards, BS Mobility Specialist Please contact via SecureChat or  Rehab office at [phone].

## 2023-09-09 NOTE — TOC Progression Note (Addendum)
 Transition of Care [facility]) - Progression Note    Patient Details  Name: [name] MRN: [id number] Date of Birth: 1945-02-05  Transition [facility]) CM/SW Contact  Isaiah Public, LCSWA Phone Number: 09/09/2023, 1:00 PM  Clinical Narrative:     CSW spoke with patient regarding SNF offers. Patient informed CSW that she is still reviewing and plans on discussing with her spouse today. Patient is going to try and have SNF choice by tomorrow.   Expected Discharge Plan: Skilled Nursing Facility Barriers to Discharge: Continued Medical Work up               Expected Discharge Plan and Services   Discharge Planning Services: CM Consult   Living arrangements for the past 2 months: Single Family Home                                       Social Drivers of Health (SDOH) Interventions SDOH Screenings   Food Insecurity: No Food Insecurity (09/08/2023)  Housing: Low Risk  (09/08/2023)  Transportation Needs: No Transportation Needs (09/08/2023)  Utilities: Not At Risk (09/08/2023)  Depression (PHQ2-9): Low Risk  (09/26/2021)  Social Connections: Unknown (09/08/2023)  Tobacco Use: Medium Risk (09/05/2023)    Readmission Risk Interventions     No data to display

## 2023-09-09 NOTE — Progress Notes (Signed)
 [facility] Progress Note     Subjective: CC:  Feeling better.  Sore after PT yesterday  Objective: Vital signs in last 24 hours: Temp:  [97.8 F (36.6 C)-98.9 F (37.2 C)] 98 F (36.7 C) (08/31 0859) Pulse Rate:  [78-85] 78 (08/31 0859) Resp:  [15-19] 18 (08/31 0859) BP: (137-148)/(73-85) 147/73 (08/31 0859) SpO2:  [88 %-94 %] 94 % (08/31 0859) Last BM Date : 09/08/23  Intake/Output from previous day: 08/30 0701 - 08/31 0700 In: 480 [P.O.:480] Out: -  Intake/Output this shift: No intake/output data recorded.  PE: Gen:  Alert, NAD, eating breakfast Abd: Soft, non-tender Skin: warm and dry, no rashes  GU: foley in place draining clear yellow urine  Psych: A&Ox3   Lab Results:  Recent Labs    09/07/23 0500 09/08/23 0639  WBC 10.8* 8.8  HGB 11.9* 10.9*  HCT 35.5* 32.3*  PLT 189 163   BMET Recent Labs    09/07/23 0500 09/08/23 0639  NA 131* 139  K 3.6 3.3*  CL 96* 108  CO2 25 22  GLUCOSE 112* 93  BUN 12 8  CREATININE 0.80 0.66  CALCIUM  8.5* 7.6*   PT/INR No results for input(s): LABPROT, INR in the last 72 hours. CMP     Component Value Date/Time   NA 139 09/08/2023 0639   K 3.3 (L) 09/08/2023 0639   CL 108 09/08/2023 0639   CO2 22 09/08/2023 0639   GLUCOSE 93 09/08/2023 0639   BUN 8 09/08/2023 0639   CREATININE 0.66 09/08/2023 0639   CALCIUM  7.6 (L) 09/08/2023 0639   PROT 6.4 (L) 05/25/2022 1424   ALBUMIN 3.9 05/25/2022 1424   AST 25 05/25/2022 1424   ALT 25 05/25/2022 1424   ALKPHOS 66 05/25/2022 1424   BILITOT 0.7 05/25/2022 1424   GFRNONAA >60 09/08/2023 0639   GFRAA >60 05/10/2017 0540   Lipase  No results found for: LIPASE     Studies/Results: No results found.   Anti-infectives: Anti-infectives (From admission, onward)    Start     Dose/Rate Route Frequency Ordered Stop   09/08/23 2200  nitrofurantoin  (macrocrystal-monohydrate) (MACROBID ) capsule 100 mg        100 mg Oral Daily at bedtime 09/08/23  1050          Assessment/Plan  78 y/o F who presented after a fall from standing Bilateral inferior and superior pelvic rami fx - Dr. Georgina consulted, non-op management, WB as tolerated, PT/OT ?Bladder injury - Urology consulted, Dr. Lovie, foley placed, BUN/Cr remain normal. Urine is clear and yellow, Pt does not want to repeat CT scan at this time.  Discussed with Dr Nieves.  Will leave foley in place for 10 days and then do voiding trial back in office.  Will give Macrobid  100mg  at bedtime for UTI prophylaxis HTN - cozaar  at home, hold for now, she is normotensive HLD - crestor  Home wellbutrin  ordered   FEN - Reg diet Pain - tylenol  1,000 mg q 6h, low dose advil  today 400 mg TID, robaxin  to 750 mg QID 8/28, oxy 10 mg q 4h PRN, home gabapentin  at night at reduced dose (600 mg nightly), IV dilaudid  for breakthrough only  VTE - Lovenox  Foley - placed 8/27 for possible bladder injury, removal per Urology after voiding trial in the office in 10 days ID - None Admit - med-surg, PT/OT currently recommending SNF Medically stable for d/c once bed available, anticipate this will be Tues due to the holiday weekend.  LOS: 4 days   I reviewed nursing notes, Consultant ortho, urology notes, last 24 h vitals and pain scores, last 48 h intake and output, last 24 h labs and trends, and last 24 h imaging results.  This care required moderate level of medical decision making.   Bernarda JAYSON Ned, MD  Colorectal and General Surgery [facility]

## 2023-09-09 NOTE — Plan of Care (Signed)
  Problem: Clinical Measurements: Goal: Will remain free from infection Outcome: Progressing   Problem: Safety: Goal: Ability to remain free from injury will improve Outcome: Progressing   Problem: Pain Managment: Goal: General experience of comfort will improve and/or be controlled Outcome: Progressing

## 2023-09-10 MED ORDER — LOSARTAN POTASSIUM 50 MG PO TABS
50.0000 mg | ORAL_TABLET | Freq: Every day | ORAL | Status: DC
  Administered 2023-09-10 – 2023-09-11 (×2): 50 mg via ORAL
  Filled 2023-09-10 (×2): qty 1

## 2023-09-10 MED ORDER — DIAZEPAM 5 MG PO TABS
5.0000 mg | ORAL_TABLET | Freq: Four times a day (QID) | ORAL | Status: DC | PRN
  Administered 2023-09-10 – 2023-09-11 (×3): 5 mg via ORAL
  Filled 2023-09-10 (×3): qty 1

## 2023-09-10 MED ORDER — HYDROCORTISONE 1 % EX CREA
TOPICAL_CREAM | Freq: Three times a day (TID) | CUTANEOUS | Status: DC | PRN
  Administered 2023-09-11: 1 via TOPICAL
  Filled 2023-09-10 (×2): qty 28

## 2023-09-10 NOTE — TOC Progression Note (Signed)
 Transition of Care [facility]) - Progression Note    Patient Details  Name: [name] MRN: [id number] Date of Birth: Sep 05, 1945  Transition of Care [facility]) CM/SW Contact  Izaha Shughart E Pressley Barsky, LCSW Phone Number: 09/10/2023, 8:56 AM  Clinical Narrative:    Spoke with patient, explained we need a choice for SNF. She requests follow up later today.   Expected Discharge Plan: Skilled Nursing Facility Barriers to Discharge: Continued Medical Work up               Expected Discharge Plan and Services   Discharge Planning Services: CM Consult   Living arrangements for the past 2 months: Single Family Home                                       Social Drivers of Health (SDOH) Interventions SDOH Screenings   Food Insecurity: No Food Insecurity (09/08/2023)  Housing: Low Risk  (09/08/2023)  Transportation Needs: No Transportation Needs (09/08/2023)  Utilities: Not At Risk (09/08/2023)  Depression (PHQ2-9): Low Risk  (09/26/2021)  Social Connections: Unknown (09/08/2023)  Tobacco Use: Medium Risk (09/05/2023)    Readmission Risk Interventions     No data to display

## 2023-09-10 NOTE — Progress Notes (Signed)
 Physical Therapy Treatment Patient Details Name: [name] MRN: [id number] DOB: [birth year] Today's Date: 09/10/2023   History of Present Illness Pt is 78 yo presenting to [facility]27 due to ground level fall at home. Pt found to have bil inferior/superior rami fractures. Currently WBAT. PMH includes Rt TKA,HTN, depression, congenital musculoskeletal deformity of spine, OSA.    PT Comments  Pt resting in bed on arrival, agreeable to session, however continues to be limited by pain despite pre-medication. Pt giving good effort throughout session and progressing transfers this session, coming to stand with RW for support and min A to boost with use of bed pad from EOB at lowest height. Pt able to maintain full upright standing ~20 seconds before needing to sit.  Pt continues to require max A to complete bed mobility due to pain in sitting and with rolling. Patient will benefit from continued inpatient follow up therapy, <3 hours/day, will continue to follow acutely.    If plan is discharge home, recommend the following: Two people to help with walking and/or transfers;Assistance with cooking/housework;Assist for transportation;Help with stairs or ramp for entrance   Can travel by private vehicle     No  Equipment Recommendations  Wheelchair cushion (measurements PT);Wheelchair (measurements PT);Hoyer lift;Hospital bed    Recommendations for Other Services       Precautions / Restrictions Precautions Precautions: Fall Recall of Precautions/Restrictions: Impaired Restrictions Weight Bearing Restrictions Per Provider Order: No Other Position/Activity Restrictions: WBAT     Mobility  Bed Mobility Overal bed mobility: Needs Assistance Bed Mobility: Sit to Supine, Supine to Sit     Supine to sit: HOB elevated, Used rails, Max assist Sit to supine: Max assist, Used rails, +2 for physical assistance, +2 for safety/equipment   General bed mobility comments: max A to come to sitting on  L EOB with max cues for hand placement and cues for pursed lip breathing, BLE and trunk assist for pt safety/comfort returning to supine and totalA +2 to scoot toward HOB in supine.    Transfers Overall transfer level: Needs assistance Equipment used: Rolling walker (2 wheels) Transfers: Sit to/from Stand Sit to Stand: Min assist           General transfer comment: pt staind from EOB at lowest height with min A, assist at bedpad. pt able to maintain ~ 20 seconds before needing to sit    Ambulation/Gait                   Stairs             Wheelchair Mobility     Tilt Bed    Modified Rankin (Stroke Patients Only)       Balance Overall balance assessment: History of Falls, Needs assistance Sitting-balance support: Bilateral upper extremity supported, Feet supported Sitting balance-Leahy Scale: Poor Sitting balance - Comments: poor unsupported; posterior bias, heavy use of BUE to offload due to pain, slight L lean.   Standing balance support: Bilateral upper extremity supported Standing balance-Leahy Scale: Poor Standing balance comment: heavy reliance on RW support                            Communication Communication Communication: No apparent difficulties  Cognition Arousal: Alert Behavior During Therapy: WFL for tasks assessed/performed, Anxious, Lability   PT - Cognitive impairments: No apparent impairments  Following commands: Impaired Following commands impaired: Follows one step commands with increased time    Cueing Cueing Techniques: Verbal cues, Tactile cues, Gestural cues, Visual cues  Exercises Other Exercises Other Exercises: glute stretches in supine    General Comments        Pertinent Vitals/Pain Pain Assessment Pain Assessment: Faces Faces Pain Scale: Hurts whole lot Pain Location: B groin and bottom Pain Descriptors / Indicators: Burning, Jabbing, Cramping, Spasm, Stabbing,  Moaning, Crying Pain Intervention(s): Monitored during session, Limited activity within patient's tolerance, Repositioned    Home Living                          Prior Function            PT Goals (current goals can now be found in the care plan section) Acute Rehab PT Goals Patient Stated Goal: Pt would like to try to return home and wants to decrease pain PT Goal Formulation: With patient Time For Goal Achievement: 09/20/23 Progress towards PT goals: Progressing toward goals    Frequency    Min 2X/week      PT Plan      Co-evaluation              AM-PAC PT 6 Clicks Mobility   Outcome Measure  Help needed turning from your back to your side while in a flat bed without using bedrails?: Total Help needed moving from lying on your back to sitting on the side of a flat bed without using bedrails?: Total Help needed moving to and from a bed to a chair (including a wheelchair)?: Total Help needed standing up from a chair using your arms (e.g., wheelchair or bedside chair)?: A Little Help needed to walk in hospital room?: Total Help needed climbing 3-5 steps with a railing? : Total 6 Click Score: 8    End of Session   Activity Tolerance: Patient limited by pain Patient left: in bed;with call bell/phone within reach;with nursing/sitter in room Nurse Communication: Mobility status PT Visit Diagnosis: Other abnormalities of gait and mobility (R26.89);History of falling (Z91.81);Muscle weakness (generalized) (M62.81);Pain Pain - Right/Left:  (bil) Pain - part of body: Hip (bottom/groin)     Time: 8473-8448 PT Time Calculation (min) (ACUTE ONLY): 25 min  Charges:    $Therapeutic Activity: 23-37 mins PT General Charges $$ ACUTE PT VISIT: 1 Visit                     Chanie Soucek R. PTA Acute Rehabilitation Services Office: [phone]   Therisa CHRISTELLA Boor 09/10/2023, 4:17 PM

## 2023-09-10 NOTE — Progress Notes (Signed)
 [facility] Progress Note     Subjective: CC:  Feeling better.  Resting comfortably but easily arouses  Waiting on decision on SNF placement  Objective: Vital signs in last 24 hours: Temp:  [97.4 F (36.3 C)-98.6 F (37 C)] 97.4 F (36.3 C) (09/01 0826) Pulse Rate:  [76-81] 76 (09/01 0826) Resp:  [15-18] 18 (09/01 0826) BP: (125-141)/(80-111) 135/88 (09/01 0826) SpO2:  [94 %-96 %] 94 % (09/01 0826) Last BM Date : 09/08/23  Intake/Output from previous day: 08/31 0701 - 09/01 0700 In: -  Out: 900 [Urine:900] Intake/Output this shift: No intake/output data recorded.  PE: Gen:  Alert, NAD, eating breakfast GU: foley in place draining clear yellow urine  Psych: A&Ox3   Lab Results:  Recent Labs    09/08/23 0639  WBC 8.8  HGB 10.9*  HCT 32.3*  PLT 163   BMET Recent Labs    09/08/23 0639  NA 139  K 3.3*  CL 108  CO2 22  GLUCOSE 93  BUN 8  CREATININE 0.66  CALCIUM  7.6*   PT/INR No results for input(s): LABPROT, INR in the last 72 hours. CMP     Component Value Date/Time   NA 139 09/08/2023 0639   K 3.3 (L) 09/08/2023 0639   CL 108 09/08/2023 0639   CO2 22 09/08/2023 0639   GLUCOSE 93 09/08/2023 0639   BUN 8 09/08/2023 0639   CREATININE 0.66 09/08/2023 0639   CALCIUM  7.6 (L) 09/08/2023 0639   PROT 6.4 (L) 05/25/2022 1424   ALBUMIN 3.9 05/25/2022 1424   AST 25 05/25/2022 1424   ALT 25 05/25/2022 1424   ALKPHOS 66 05/25/2022 1424   BILITOT 0.7 05/25/2022 1424   GFRNONAA >60 09/08/2023 0639   GFRAA >60 05/10/2017 0540   Lipase  No results found for: LIPASE     Studies/Results: No results found.   Anti-infectives: Anti-infectives (From admission, onward)    Start     Dose/Rate Route Frequency Ordered Stop   09/08/23 2200  nitrofurantoin  (macrocrystal-monohydrate) (MACROBID ) capsule 100 mg        100 mg Oral Daily at bedtime 09/08/23 1050          Assessment/Plan  78 y/o F who presented after a fall from  standing Bilateral inferior and superior pelvic rami fx - Dr. Georgina consulted, non-op management, WB as tolerated, PT/OT ?Bladder injury - Urology consulted, Dr. Lovie, foley placed, BUN/Cr remain normal. Urine is clear and yellow, Pt does not want to repeat CT scan at this time.  Discussed with Dr Nieves.  Will leave foley in place for 10 days and then do voiding trial back in office.  Will give Macrobid  100mg  at bedtime for UTI prophylaxis HTN - cozaar  at home, restarted due to hypertension HLD - crestor  Home wellbutrin  ordered   FEN - Reg diet Pain - tylenol  1,000 mg q 6h, low dose advil  today 400 mg TID, robaxin  to 750 mg QID 8/28, oxy 10 mg q 4h PRN, home gabapentin  at night at reduced dose (600 mg nightly), IV dilaudid  for breakthrough only  VTE - Lovenox  Foley - placed 8/27 for possible bladder injury, removal per Urology after voiding trial in the office in 10 days ID - None Admit - med-surg, PT/OT currently recommending SNF Medically stable for d/c once SNF chosen and bed available, anticipate this will be atleast Tues due to the holiday weekend.   LOS: 5 days   I reviewed nursing notes, Consultant ortho, urology notes, last 24 h  vitals and pain scores, last 48 h intake and output, last 24 h labs and trends, and last 24 h imaging results.  This care required moderate level of medical decision making.   Bernarda JAYSON Ned, MD  Colorectal and General Surgery [facility] Surgery

## 2023-09-10 NOTE — Plan of Care (Signed)

## 2023-09-10 NOTE — TOC Progression Note (Signed)
 Transition of Care [facility]) - Progression Note    Patient Details  Name: [name] MRN: [id number] Date of Birth: 23-Sep-1945  Transition of Care [facility]) CM/SW Contact  Bridget Cordella Simmonds, LCSW Phone Number: 09/10/2023, 11:55 AM  Clinical Narrative:   CSW spoke with pt and husband Fairy regarding SNF choice.  They will accept offer at [facility].     Expected Discharge Plan: Skilled Nursing Facility Barriers to Discharge: Continued Medical Work up               Expected Discharge Plan and Services   Discharge Planning Services: CM Consult   Living arrangements for the past 2 months: Single Family Home                                       Social Drivers of Health (SDOH) Interventions SDOH Screenings   Food Insecurity: No Food Insecurity (09/08/2023)  Housing: Low Risk  (09/08/2023)  Transportation Needs: No Transportation Needs (09/08/2023)  Utilities: Not At Risk (09/08/2023)  Depression (PHQ2-9): Low Risk  (09/26/2021)  Social Connections: Unknown (09/08/2023)  Tobacco Use: Medium Risk (09/05/2023)    Readmission Risk Interventions     No data to display

## 2023-09-10 NOTE — TOC Progression Note (Signed)
 Transition of Care [facility]) - Progression Note    Patient Details  Name: [name] MRN: [id number] Date of Birth: 03/29/1945  Transition of Care [facility]) CM/SW Contact  Kaybree Williams E Saveah Bahar, LCSW Phone Number: 09/10/2023, 1:09 PM  Clinical Narrative:    Spoke with Soy with [name] who confirms they can admit patient tomorrow 9/2.   Expected Discharge Plan: Skilled Nursing Facility Barriers to Discharge: Continued Medical Work up               Expected Discharge Plan and Services   Discharge Planning Services: CM Consult   Living arrangements for the past 2 months: Single Family Home                                       Social Drivers of Health (SDOH) Interventions SDOH Screenings   Food Insecurity: No Food Insecurity (09/08/2023)  Housing: Low Risk  (09/08/2023)  Transportation Needs: No Transportation Needs (09/08/2023)  Utilities: Not At Risk (09/08/2023)  Depression (PHQ2-9): Low Risk  (09/26/2021)  Social Connections: Unknown (09/08/2023)  Tobacco Use: Medium Risk (09/05/2023)    Readmission Risk Interventions     No data to display

## 2023-09-11 DIAGNOSIS — N390 Urinary tract infection, site not specified: Secondary | ICD-10-CM | POA: Diagnosis not present

## 2023-09-11 DIAGNOSIS — Z978 Presence of other specified devices: Secondary | ICD-10-CM | POA: Diagnosis not present

## 2023-09-11 DIAGNOSIS — N329 Bladder disorder, unspecified: Secondary | ICD-10-CM | POA: Diagnosis not present

## 2023-09-11 DIAGNOSIS — R1030 Lower abdominal pain, unspecified: Secondary | ICD-10-CM | POA: Diagnosis not present

## 2023-09-11 DIAGNOSIS — R238 Other skin changes: Secondary | ICD-10-CM | POA: Diagnosis not present

## 2023-09-11 DIAGNOSIS — R300 Dysuria: Secondary | ICD-10-CM | POA: Diagnosis not present

## 2023-09-11 DIAGNOSIS — I10 Essential (primary) hypertension: Secondary | ICD-10-CM | POA: Diagnosis not present

## 2023-09-11 DIAGNOSIS — R1312 Dysphagia, oropharyngeal phase: Secondary | ICD-10-CM | POA: Diagnosis not present

## 2023-09-11 DIAGNOSIS — G8911 Acute pain due to trauma: Secondary | ICD-10-CM | POA: Diagnosis not present

## 2023-09-11 DIAGNOSIS — F32A Depression, unspecified: Secondary | ICD-10-CM | POA: Diagnosis not present

## 2023-09-11 DIAGNOSIS — M545 Low back pain, unspecified: Secondary | ICD-10-CM | POA: Diagnosis not present

## 2023-09-11 DIAGNOSIS — S32592A Other specified fracture of left pubis, initial encounter for closed fracture: Secondary | ICD-10-CM | POA: Diagnosis not present

## 2023-09-11 DIAGNOSIS — S32591A Other specified fracture of right pubis, initial encounter for closed fracture: Secondary | ICD-10-CM | POA: Diagnosis not present

## 2023-09-11 DIAGNOSIS — E785 Hyperlipidemia, unspecified: Secondary | ICD-10-CM | POA: Diagnosis not present

## 2023-09-11 DIAGNOSIS — R41841 Cognitive communication deficit: Secondary | ICD-10-CM | POA: Diagnosis not present

## 2023-09-11 DIAGNOSIS — N898 Other specified noninflammatory disorders of vagina: Secondary | ICD-10-CM | POA: Diagnosis not present

## 2023-09-11 DIAGNOSIS — S3720XD Unspecified injury of bladder, subsequent encounter: Secondary | ICD-10-CM | POA: Diagnosis not present

## 2023-09-11 DIAGNOSIS — W19XXXA Unspecified fall, initial encounter: Secondary | ICD-10-CM | POA: Diagnosis not present

## 2023-09-11 DIAGNOSIS — R2689 Other abnormalities of gait and mobility: Secondary | ICD-10-CM | POA: Diagnosis not present

## 2023-09-11 DIAGNOSIS — Z96651 Presence of right artificial knee joint: Secondary | ICD-10-CM | POA: Diagnosis not present

## 2023-09-11 DIAGNOSIS — B3731 Acute candidiasis of vulva and vagina: Secondary | ICD-10-CM | POA: Diagnosis not present

## 2023-09-11 DIAGNOSIS — S32810A Multiple fractures of pelvis with stable disruption of pelvic ring, initial encounter for closed fracture: Secondary | ICD-10-CM | POA: Diagnosis not present

## 2023-09-11 DIAGNOSIS — L299 Pruritus, unspecified: Secondary | ICD-10-CM | POA: Diagnosis not present

## 2023-09-11 DIAGNOSIS — E042 Nontoxic multinodular goiter: Secondary | ICD-10-CM | POA: Diagnosis not present

## 2023-09-11 DIAGNOSIS — F419 Anxiety disorder, unspecified: Secondary | ICD-10-CM | POA: Diagnosis not present

## 2023-09-11 DIAGNOSIS — M47817 Spondylosis without myelopathy or radiculopathy, lumbosacral region: Secondary | ICD-10-CM | POA: Diagnosis not present

## 2023-09-11 DIAGNOSIS — J302 Other seasonal allergic rhinitis: Secondary | ICD-10-CM | POA: Diagnosis not present

## 2023-09-11 DIAGNOSIS — Z96641 Presence of right artificial hip joint: Secondary | ICD-10-CM | POA: Diagnosis not present

## 2023-09-11 DIAGNOSIS — M6281 Muscle weakness (generalized): Secondary | ICD-10-CM | POA: Diagnosis not present

## 2023-09-11 DIAGNOSIS — G8929 Other chronic pain: Secondary | ICD-10-CM | POA: Diagnosis not present

## 2023-09-11 DIAGNOSIS — G4733 Obstructive sleep apnea (adult) (pediatric): Secondary | ICD-10-CM | POA: Diagnosis not present

## 2023-09-11 DIAGNOSIS — K5909 Other constipation: Secondary | ICD-10-CM | POA: Diagnosis not present

## 2023-09-11 DIAGNOSIS — S3282XD Multiple fractures of pelvis without disruption of pelvic ring, subsequent encounter for fracture with routine healing: Secondary | ICD-10-CM | POA: Diagnosis not present

## 2023-09-11 DIAGNOSIS — S329XXA Fracture of unspecified parts of lumbosacral spine and pelvis, initial encounter for closed fracture: Secondary | ICD-10-CM | POA: Diagnosis not present

## 2023-09-11 DIAGNOSIS — E871 Hypo-osmolality and hyponatremia: Secondary | ICD-10-CM | POA: Diagnosis not present

## 2023-09-11 DIAGNOSIS — E663 Overweight: Secondary | ICD-10-CM | POA: Diagnosis not present

## 2023-09-11 DIAGNOSIS — S3091XA Unspecified superficial injury of lower back and pelvis, initial encounter: Secondary | ICD-10-CM | POA: Diagnosis not present

## 2023-09-11 DIAGNOSIS — Z9181 History of falling: Secondary | ICD-10-CM | POA: Diagnosis not present

## 2023-09-11 DIAGNOSIS — R059 Cough, unspecified: Secondary | ICD-10-CM | POA: Diagnosis not present

## 2023-09-11 DIAGNOSIS — Z7401 Bed confinement status: Secondary | ICD-10-CM | POA: Diagnosis not present

## 2023-09-11 DIAGNOSIS — F330 Major depressive disorder, recurrent, mild: Secondary | ICD-10-CM | POA: Diagnosis not present

## 2023-09-11 DIAGNOSIS — F5101 Primary insomnia: Secondary | ICD-10-CM | POA: Diagnosis not present

## 2023-09-11 DIAGNOSIS — B964 Proteus (mirabilis) (morganii) as the cause of diseases classified elsewhere: Secondary | ICD-10-CM | POA: Diagnosis not present

## 2023-09-11 DIAGNOSIS — M419 Scoliosis, unspecified: Secondary | ICD-10-CM | POA: Diagnosis not present

## 2023-09-11 MED ORDER — ACETAMINOPHEN 500 MG PO TABS: 1000.0000 mg | ORAL_TABLET | Freq: Four times a day (QID) | ORAL | Status: AC

## 2023-09-11 MED ORDER — METHOCARBAMOL 750 MG PO TABS: 750.0000 mg | ORAL_TABLET | Freq: Four times a day (QID) | ORAL | Status: DC

## 2023-09-11 MED ORDER — HYDROCORTISONE 1 % EX CREA: TOPICAL_CREAM | Freq: Three times a day (TID) | CUTANEOUS | Status: AC | PRN

## 2023-09-11 MED ORDER — IBUPROFEN 400 MG PO TABS: 400.0000 mg | ORAL_TABLET | Freq: Three times a day (TID) | ORAL | Status: AC

## 2023-09-11 MED ORDER — DIAZEPAM 5 MG PO TABS: 5.0000 mg | ORAL_TABLET | Freq: Four times a day (QID) | ORAL | 0 refills | Status: AC | PRN

## 2023-09-11 MED ORDER — OXYCODONE HCL 10 MG PO TABS: 5.0000 mg | ORAL_TABLET | ORAL | 0 refills | Status: AC | PRN

## 2023-09-11 MED ORDER — GABAPENTIN 300 MG PO CAPS: 600.0000 mg | ORAL_CAPSULE | Freq: Every day | ORAL | Status: AC

## 2023-09-11 MED ORDER — NITROFURANTOIN MONOHYD MACRO 100 MG PO CAPS: 100.0000 mg | ORAL_CAPSULE | Freq: Every day | ORAL | Status: AC

## 2023-09-11 MED ORDER — POLYETHYLENE GLYCOL 3350 17 G PO PACK: 17.0000 g | PACK | Freq: Every day | ORAL | Status: AC | PRN

## 2023-09-11 NOTE — TOC Transition Note (Addendum)
 Transition of Care [facility]) - Discharge Note   Patient Details  Name: [name] MRN: [id number] Date of Birth: [birth year]  Transition [facility] Care [facility]) CM/SW Contact:  Jenavee Laguardia E Patrisia Faeth, LCSW Phone Number: 09/11/2023, 10:56 AM   Clinical Narrative:    Discharge to [name] today. [address]. Confirmed with Admissions Worker Soy. Updated MD, RN, and spouse Fairy. Asked RN to call report. EMS paperwork completed. [facility] called for transport at 11:55 am.  RN notified.    Final next level of care: Skilled Nursing Facility Barriers to Discharge: Barriers Resolved   Patient Goals and CMS Choice   CMS Medicare.gov Compare Post Acute Care list provided to:: Patient Choice offered to / list presented to : Patient [place] ownership interest in [facility].provided to:: Patient    Discharge Placement              Patient chooses bed at: [name] Patient to be transferred to facility by: [facility] Name of family member notified: left VM for spouse Patient and family notified of of transfer: 09/11/23  Discharge Plan and Services Additional resources added to the After Visit Summary for     Discharge Planning Services: CM Consult                                 Social Drivers of Health (SDOH) Interventions SDOH Screenings   Food Insecurity: No Food Insecurity (09/08/2023)  Housing: Low Risk  (09/08/2023)  Transportation Needs: No Transportation Needs (09/08/2023)  Utilities: Not At Risk (09/08/2023)  Depression (PHQ2-9): Low Risk  (09/26/2021)  Social Connections: Unknown (09/08/2023)  Tobacco Use: Medium Risk (09/05/2023)     Readmission Risk Interventions     No data to display

## 2023-09-11 NOTE — Progress Notes (Signed)
 Medication details completed for AVS. AVS printed and placed with patient chart.
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
## 2023-09-11 NOTE — Plan of Care (Signed)

## 2023-09-11 NOTE — Discharge Summary (Signed)
 Physician Discharge Summary  Patient ID: [name] MRN: [id number] DOB/AGE: 1945/05/20 78 y.o.  Admit date: 09/05/2023 Discharge date: 09/11/2023  Discharge Diagnoses Fall from standing Pelvic fractures Questionable bladder injury History of HTN, HLD, depression  Consultants Urology Orthopedics  Procedures None  HPI/Hospital Course:  [name] is a 78 y.o. female with a past medical history of hypertension, depression, anemia, congenital musculoskeletal deformity of spine, incontinence of bowel, sleep apnea, spinal stenosis, and lumbosacral spondylosis. Patient presented to the ED due to mechanical fall at home after tripping while moving a sewing machine. She arrived in stable condition and was evaluated by the EDP. Patient underwent a CT pelvis that showed several pelvic fractures along with fluid within the space of Retzius concerning for possible bladder perforation.   The bilateral inferior and superior pelvic rami fractures were evaluated by orthopedics who recommended non-operative management, weight bearing as tolerated and PT/OT. Will plan to follow up with outpatient orthopedics at discharge.  Urology was consulted regarding pelvic ring injury with possible hematoma adjacent to bladder on noncontrast study. A foley catheter was placed by ER staff without complications. Contrasted images with delayed films were obtained. There was no obvious extravasation although bladder was incompletely filled. It was explained that the studies conducted could not definitively rule out a bladder injury. Patient requested that no further imaging be completed. She elected to continue the foley catheter for 10 days. Outpatient follow up with urology to be arranged with voiding trial. Macrobid  provided for nighttime administration for UTI prophylaxis.   Patient evaluated by PT/OT throughout her hospital course. SNF recommended at discharge.  On 09/11/2023, patient was fit for discharge  to [name] skilled nursing facility. She will follow up outpatient with the following specialists as outlined below.    Physical exam: Gen: Pleasant female in NAD resting in bed. HEENT: Atraumatic, trachea midline, no lacerations/abrasions Resp: Nonlabored breathing on room air CV: RRR, HR 80s Abd: Soft, non-distended. Mild tenderness to palpation of lower pelvic region. No rebound tenderness or guarding. GU: Foley catheter in place with yellow clear urine draining in bag    Allergies as of 09/11/2023       Reactions   Lisinopril Cough        Medication List     STOP taking these medications    celecoxib  200 MG capsule Commonly known as: CELEBREX        TAKE these medications    acetaminophen  500 MG tablet Commonly known as: TYLENOL  Take 2 tablets (1,000 mg total) by mouth every 6 (six) hours.   Biofreeze 10 % Liqd Generic drug: Menthol  (Topical Analgesic) Apply 1 application  topically daily as needed (pain).   buPROPion  150 MG 24 hr tablet Commonly known as: WELLBUTRIN  XL Take 450 mg by mouth daily.   cetirizine 10 MG tablet Commonly known as: ZYRTEC Take 10 mg by mouth daily as needed for allergies.   diazepam  5 MG tablet Commonly known as: VALIUM  Take 1 tablet (5 mg total) by mouth every 6 (six) hours as needed for muscle spasms.   diphenhydrAMINE  50 MG capsule Commonly known as: BENADRYL  Take 50 mg by mouth at bedtime as needed for sleep.   docusate sodium  50 MG capsule Commonly known as: COLACE Take 50 mg by mouth daily.   gabapentin  300 MG capsule Commonly known as: NEURONTIN  Take 2 capsules (600 mg total) by mouth at bedtime.   hydrocortisone  cream 1 % Apply topically 3 (three) times daily as needed for itching.  ibuprofen  400 MG tablet Commonly known as: ADVIL  Take 1 tablet (400 mg total) by mouth 3 (three) times daily.   losartan  50 MG tablet Commonly known as: COZAAR  Take 50 mg by mouth daily.   methocarbamol  750 MG  tablet Commonly known as: ROBAXIN  Take 1 tablet (750 mg total) by mouth 4 (four) times daily. What changed:  medication strength how much to take when to take this reasons to take this   nitrofurantoin  (macrocrystal-monohydrate) 100 MG capsule Commonly known as: MACROBID  Take 1 capsule (100 mg total) by mouth at bedtime for 7 days.   Oxycodone  HCl 10 MG Tabs Take 0.5-1 tablets (5-10 mg total) by mouth every 4 (four) hours as needed for moderate pain (pain score 4-6) or severe pain (pain score 7-10) (5 mg for moderate pain, 10 mg for severe pain).   polyethylene glycol 17 g packet Commonly known as: MIRALAX  / GLYCOLAX  Take 17 g by mouth daily as needed (constipation).   rosuvastatin  40 MG tablet Commonly known as: CRESTOR  Take 40 mg by mouth daily.   VISINE OP Place 1 drop into both eyes daily as needed (allergies).          Contact information for follow-up providers     [facility]. Schedule an appointment as soon as possible for a visit in 1 week(s).   Why: For foley removal Contact information: [address] [place] [address] [place]  [address] [phone]        Georgina Ozell LABOR, MD. Schedule an appointment as soon as possible for a visit.   Specialty: Orthopedic Surgery Why: to follow up regarding pelvic fractures Contact information: [address] [place] [address] [phone]         Katina Pfeiffer, PA-C. Schedule an appointment as soon as possible for a visit.   Specialty: Family Medicine Why: to follow up upon discharge from SNF Contact information: [address] [place] [address] [phone]              Contact information for after-discharge care     Destination     [name] .   Service: Skilled Nursing Contact information: [address] [name] [place]  [address] [phone]                     Signed: Marjorie Carlyon Favre , [facility]  Surgery 09/11/2023, 11:03 AM Please see Amion for pager number during day hours 7:00am-4:30pm

## 2023-09-12 DIAGNOSIS — J302 Other seasonal allergic rhinitis: Secondary | ICD-10-CM | POA: Diagnosis not present

## 2023-09-12 DIAGNOSIS — I10 Essential (primary) hypertension: Secondary | ICD-10-CM | POA: Diagnosis not present

## 2023-09-12 DIAGNOSIS — R300 Dysuria: Secondary | ICD-10-CM | POA: Diagnosis not present

## 2023-09-12 DIAGNOSIS — S3282XD Multiple fractures of pelvis without disruption of pelvic ring, subsequent encounter for fracture with routine healing: Secondary | ICD-10-CM | POA: Diagnosis not present

## 2023-09-12 DIAGNOSIS — F32A Depression, unspecified: Secondary | ICD-10-CM | POA: Diagnosis not present

## 2023-09-12 DIAGNOSIS — F5101 Primary insomnia: Secondary | ICD-10-CM | POA: Diagnosis not present

## 2023-09-12 DIAGNOSIS — K5909 Other constipation: Secondary | ICD-10-CM | POA: Diagnosis not present

## 2023-09-12 DIAGNOSIS — E785 Hyperlipidemia, unspecified: Secondary | ICD-10-CM | POA: Diagnosis not present

## 2023-09-12 DIAGNOSIS — F330 Major depressive disorder, recurrent, mild: Secondary | ICD-10-CM | POA: Diagnosis not present

## 2023-09-12 DIAGNOSIS — M47817 Spondylosis without myelopathy or radiculopathy, lumbosacral region: Secondary | ICD-10-CM | POA: Diagnosis not present

## 2023-09-13 DIAGNOSIS — G8911 Acute pain due to trauma: Secondary | ICD-10-CM | POA: Diagnosis not present

## 2023-09-13 DIAGNOSIS — K5909 Other constipation: Secondary | ICD-10-CM | POA: Diagnosis not present

## 2023-09-13 DIAGNOSIS — E785 Hyperlipidemia, unspecified: Secondary | ICD-10-CM | POA: Diagnosis not present

## 2023-09-13 DIAGNOSIS — Z96641 Presence of right artificial hip joint: Secondary | ICD-10-CM | POA: Diagnosis not present

## 2023-09-13 DIAGNOSIS — M545 Low back pain, unspecified: Secondary | ICD-10-CM | POA: Diagnosis not present

## 2023-09-13 DIAGNOSIS — S3282XD Multiple fractures of pelvis without disruption of pelvic ring, subsequent encounter for fracture with routine healing: Secondary | ICD-10-CM | POA: Diagnosis not present

## 2023-09-13 DIAGNOSIS — M47817 Spondylosis without myelopathy or radiculopathy, lumbosacral region: Secondary | ICD-10-CM | POA: Diagnosis not present

## 2023-09-13 DIAGNOSIS — Z96651 Presence of right artificial knee joint: Secondary | ICD-10-CM | POA: Diagnosis not present

## 2023-09-17 ENCOUNTER — Ambulatory Visit (INDEPENDENT_AMBULATORY_CARE_PROVIDER_SITE_OTHER): Admitting: Orthopedic Surgery

## 2023-09-17 ENCOUNTER — Other Ambulatory Visit (INDEPENDENT_AMBULATORY_CARE_PROVIDER_SITE_OTHER)

## 2023-09-17 DIAGNOSIS — N898 Other specified noninflammatory disorders of vagina: Secondary | ICD-10-CM | POA: Diagnosis not present

## 2023-09-17 DIAGNOSIS — B3731 Acute candidiasis of vulva and vagina: Secondary | ICD-10-CM | POA: Diagnosis not present

## 2023-09-17 DIAGNOSIS — S329XXA Fracture of unspecified parts of lumbosacral spine and pelvis, initial encounter for closed fracture: Secondary | ICD-10-CM

## 2023-09-17 DIAGNOSIS — N390 Urinary tract infection, site not specified: Secondary | ICD-10-CM | POA: Diagnosis not present

## 2023-09-17 DIAGNOSIS — B964 Proteus (mirabilis) (morganii) as the cause of diseases classified elsewhere: Secondary | ICD-10-CM | POA: Diagnosis not present

## 2023-09-17 NOTE — Progress Notes (Signed)
 Orthopedic Surgery Progress Note   Assessment: Patient is a 78 y.o. female with pelvic ring injury Date of injury: 09/06/2023 (~2 weeks from date of injury)   Plan: -Discussed the fact that there has been some displacement of her superior rami fractures.  Told her that surgery may help with pain on the short run but outcomes would likely be similar at longer term without surgery.  Patient was not interested in any kind of surgical intervention -Will continue to monitor and treat nonoperatively -Weightbearing as tolerated right lower extremity, toe-touch weightbearing left lower extremity -Continue to work with physical therapy -Pain control -Patient should return to the office in 4, x-rays at next visit: AP/inlet/outlet pelvis  ___________________________________________________________________________  History: Patient had a fall at home about 2 weeks ago. She presented to the hospital and was found to have a pelvic ring injury. She eventually mobilized better once pain was under better control. She was discharge to a SNF. She presents today for follow up on this injury.  Patient continues to have significant pain particular on the left side in the groin.  She has difficulty with weightbearing.  She is able to get to the bathroom at the skilled nursing facility but is not able to do much more.  She has been working with physical therapy.  She is not taking any pain medications consistently.  She says she takes oxycodone  once every other day.  She has been using a muscle relaxer more regularly but that does not give her the same pain relief.  She still has a Foley catheter in place and is supposed to see urology this week.   Physical Exam:  General: no acute distress, appears stated age Neurologic: alert, answering questions appropriately, following commands Respiratory: unlabored breathing on room air, symmetric chest rise Psychiatric: appropriate affect, normal cadence to speech  MSK:    -Bilateral lower extremity  In wheelchair  Pain with logroll, worse on the left side.  Leg lengths appear symmetric, no open wounds.  No gross deformity. EHL/TA/GSC intact Sensation intact to light touch in sural, saphenous, tibial, deep peroneal, and superficial peroneal nerve distributions Foot warm and well perfused   Imaging:  XRs of the pelvis from 09/17/2023 were independently reviewed and interpreted, showing displaced bilateral superior rami fractures.  Inferior rami fractures bilaterally that appear minimally displaced.  No sacral fracture seen.  No fracture around the proximal femur on either side.  No dislocation seen.  Joint space narrowing and cam deformity seen within the left hip.   Patient name: [name] Patient MRN: [id number] Date: 09/17/23

## 2023-09-19 DIAGNOSIS — M47817 Spondylosis without myelopathy or radiculopathy, lumbosacral region: Secondary | ICD-10-CM | POA: Diagnosis not present

## 2023-09-19 DIAGNOSIS — F330 Major depressive disorder, recurrent, mild: Secondary | ICD-10-CM | POA: Diagnosis not present

## 2023-09-19 DIAGNOSIS — G8911 Acute pain due to trauma: Secondary | ICD-10-CM | POA: Diagnosis not present

## 2023-09-19 DIAGNOSIS — N898 Other specified noninflammatory disorders of vagina: Secondary | ICD-10-CM | POA: Diagnosis not present

## 2023-09-19 DIAGNOSIS — F5101 Primary insomnia: Secondary | ICD-10-CM | POA: Diagnosis not present

## 2023-09-19 DIAGNOSIS — S3282XD Multiple fractures of pelvis without disruption of pelvic ring, subsequent encounter for fracture with routine healing: Secondary | ICD-10-CM | POA: Diagnosis not present

## 2023-09-20 DIAGNOSIS — S3720XD Unspecified injury of bladder, subsequent encounter: Secondary | ICD-10-CM | POA: Diagnosis not present

## 2023-09-21 ENCOUNTER — Other Ambulatory Visit (HOSPITAL_COMMUNITY): Payer: Self-pay | Admitting: Nurse Practitioner

## 2023-09-21 DIAGNOSIS — I10 Essential (primary) hypertension: Secondary | ICD-10-CM | POA: Diagnosis not present

## 2023-09-21 DIAGNOSIS — S3720XD Unspecified injury of bladder, subsequent encounter: Secondary | ICD-10-CM | POA: Diagnosis not present

## 2023-09-21 DIAGNOSIS — S32810A Multiple fractures of pelvis with stable disruption of pelvic ring, initial encounter for closed fracture: Secondary | ICD-10-CM

## 2023-09-21 DIAGNOSIS — Z978 Presence of other specified devices: Secondary | ICD-10-CM | POA: Diagnosis not present

## 2023-09-24 DIAGNOSIS — S3720XD Unspecified injury of bladder, subsequent encounter: Secondary | ICD-10-CM | POA: Diagnosis not present

## 2023-09-24 DIAGNOSIS — F32A Depression, unspecified: Secondary | ICD-10-CM | POA: Diagnosis not present

## 2023-09-24 DIAGNOSIS — Z978 Presence of other specified devices: Secondary | ICD-10-CM | POA: Diagnosis not present

## 2023-09-26 DIAGNOSIS — G8911 Acute pain due to trauma: Secondary | ICD-10-CM | POA: Diagnosis not present

## 2023-09-26 DIAGNOSIS — S3282XD Multiple fractures of pelvis without disruption of pelvic ring, subsequent encounter for fracture with routine healing: Secondary | ICD-10-CM | POA: Diagnosis not present

## 2023-09-26 DIAGNOSIS — Z978 Presence of other specified devices: Secondary | ICD-10-CM | POA: Diagnosis not present

## 2023-09-26 DIAGNOSIS — F330 Major depressive disorder, recurrent, mild: Secondary | ICD-10-CM | POA: Diagnosis not present

## 2023-09-27 ENCOUNTER — Ambulatory Visit (HOSPITAL_COMMUNITY)

## 2023-09-27 ENCOUNTER — Telehealth: Payer: Self-pay | Admitting: Orthopedic Surgery

## 2023-09-27 DIAGNOSIS — S3282XD Multiple fractures of pelvis without disruption of pelvic ring, subsequent encounter for fracture with routine healing: Secondary | ICD-10-CM | POA: Diagnosis not present

## 2023-09-27 DIAGNOSIS — G8911 Acute pain due to trauma: Secondary | ICD-10-CM | POA: Diagnosis not present

## 2023-09-27 NOTE — Telephone Encounter (Signed)
 Patient called and said she is in severe pain and she just don't know what to do. She wants you to call her daughter to discuss what's going on. CB#[phone]  [name]. She said she has permission.

## 2023-09-28 ENCOUNTER — Ambulatory Visit (HOSPITAL_COMMUNITY)
Admission: RE | Admit: 2023-09-28 | Discharge: 2023-09-28 | Disposition: A | Discharge status: Home / Self Care | Source: Ambulatory Visit | Attending: Nurse Practitioner | Admitting: Nurse Practitioner

## 2023-09-28 DIAGNOSIS — S32810A Multiple fractures of pelvis with stable disruption of pelvic ring, initial encounter for closed fracture: Secondary | ICD-10-CM | POA: Insufficient documentation

## 2023-09-28 DIAGNOSIS — N329 Bladder disorder, unspecified: Secondary | ICD-10-CM | POA: Diagnosis not present

## 2023-09-28 DIAGNOSIS — S3720XD Unspecified injury of bladder, subsequent encounter: Secondary | ICD-10-CM

## 2023-09-28 DIAGNOSIS — W19XXXA Unspecified fall, initial encounter: Secondary | ICD-10-CM | POA: Insufficient documentation

## 2023-10-01 DIAGNOSIS — S3282XD Multiple fractures of pelvis without disruption of pelvic ring, subsequent encounter for fracture with routine healing: Secondary | ICD-10-CM | POA: Diagnosis not present

## 2023-10-01 DIAGNOSIS — M47817 Spondylosis without myelopathy or radiculopathy, lumbosacral region: Secondary | ICD-10-CM | POA: Diagnosis not present

## 2023-10-01 DIAGNOSIS — Z978 Presence of other specified devices: Secondary | ICD-10-CM | POA: Diagnosis not present

## 2023-10-01 DIAGNOSIS — G8911 Acute pain due to trauma: Secondary | ICD-10-CM | POA: Diagnosis not present

## 2023-10-05 DIAGNOSIS — F330 Major depressive disorder, recurrent, mild: Secondary | ICD-10-CM | POA: Diagnosis not present

## 2023-10-05 DIAGNOSIS — F419 Anxiety disorder, unspecified: Secondary | ICD-10-CM | POA: Diagnosis not present

## 2023-10-08 DIAGNOSIS — R059 Cough, unspecified: Secondary | ICD-10-CM | POA: Diagnosis not present

## 2023-10-12 ENCOUNTER — Telehealth: Payer: Self-pay

## 2023-10-12 DIAGNOSIS — S3720XD Unspecified injury of bladder, subsequent encounter: Secondary | ICD-10-CM | POA: Diagnosis not present

## 2023-10-12 NOTE — Telephone Encounter (Signed)
 Patient called and states she is having so much pain in Left leg/thigh pain. Doing PT at [facility] place. Would like an MRI. Takes Hydrocondone & Methocarmabol- helps temp. Has a scheduled F/U appt 10/17/2023.    Pelvic ring injury Date of injury: 09/06/2023   CB: [phone]

## 2023-10-15 DIAGNOSIS — E785 Hyperlipidemia, unspecified: Secondary | ICD-10-CM | POA: Diagnosis not present

## 2023-10-15 DIAGNOSIS — G8911 Acute pain due to trauma: Secondary | ICD-10-CM | POA: Diagnosis not present

## 2023-10-15 DIAGNOSIS — S3282XD Multiple fractures of pelvis without disruption of pelvic ring, subsequent encounter for fracture with routine healing: Secondary | ICD-10-CM | POA: Diagnosis not present

## 2023-10-15 DIAGNOSIS — I10 Essential (primary) hypertension: Secondary | ICD-10-CM | POA: Diagnosis not present

## 2023-10-15 DIAGNOSIS — K5909 Other constipation: Secondary | ICD-10-CM | POA: Diagnosis not present

## 2023-10-17 ENCOUNTER — Other Ambulatory Visit: Payer: Self-pay

## 2023-10-17 ENCOUNTER — Ambulatory Visit: Admitting: Orthopedic Surgery

## 2023-10-17 DIAGNOSIS — F32A Depression, unspecified: Secondary | ICD-10-CM | POA: Diagnosis not present

## 2023-10-17 DIAGNOSIS — S329XXA Fracture of unspecified parts of lumbosacral spine and pelvis, initial encounter for closed fracture: Secondary | ICD-10-CM

## 2023-10-17 DIAGNOSIS — G8911 Acute pain due to trauma: Secondary | ICD-10-CM | POA: Diagnosis not present

## 2023-10-17 DIAGNOSIS — S3282XD Multiple fractures of pelvis without disruption of pelvic ring, subsequent encounter for fracture with routine healing: Secondary | ICD-10-CM | POA: Diagnosis not present

## 2023-10-20 NOTE — Progress Notes (Signed)
 Orthopedic Surgery Progress Note     Assessment: Patient is a 78 y.o. female with pelvic ring injury Date of injury: 09/06/2023 (~6 weeks from date of injury)     Plan: -Patient still not interested in any kind of surgery.  Will continue to monitor nonoperatively.  Going forward would not recommend surgery as it would be difficult to reduce the fractures with the scarring and healing that has occurred.  Plus, her groin pain has gotten better.  Her newer pain down the posterior aspect of the leg may be sciatic.  She has a large lumbar fusion so it seems unlikely that it would be radicular in nature -Weightbearing as tolerated -Continue to work with physical therapy -Pain control -Patient should return to the office in 6, x-rays at next visit: AP/inlet/outlet pelvis   ___________________________________________________________________________   History: Patient has remained in a skilled nursing facility since she was last seen in the office.  Her pain in the groin has gotten better since she was last seen in the office.  However, she has developed pain along the posterior aspect of her left thigh and buttock.  She said that this pain is severe in nature.  It does not sometimes radiate past the knee but it is not as consistent.  She feels the pain mostly with sitting for prolonged periods.  She feels a cramping sensation in the back of her leg as well.  No pain radiating into the right lower extremity.  There is no recent trauma or injury that preceded the onset of this pain in the posterior aspect of her thigh.  Foley catheter has been removed and a cystogram performed that did not show any bladder injury.     Physical Exam:   General: no acute distress, appears stated age Neurologic: alert, answering questions appropriately, following commands Respiratory: unlabored breathing on room air, symmetric chest rise Psychiatric: appropriate affect, normal cadence to speech   MSK:    -Bilateral  lower extremity             No pain with logroll, negative straight leg raise, negative FADIR, no pain through hip range of motion EHL/TA/GSC intact Sensation intact to light touch in sural, saphenous, tibial, deep peroneal, and superficial peroneal nerve distributions Foot warm and well perfused     Imaging:  XRs of the pelvis from 10/17/2023 were independently reviewed and interpreted, showing displaced bilateral superior rami fractures.  Inferior rami fractures are minimally displaced.  There is not been interval change in her alignment since films on 09/17/2023.  No significant callus formation seen.  No sacral fracture seen.  No new fracture seen.  No dislocation seen.     Patient name: [name] Patient MRN: [id number] Date: 10/17/2023  I spent 30 minutes in the room with the patient and her husband.  Her children are on the phone as well.  We covered her change in symptoms.  I performed an exam at this time.  We also discussed next [facility] management.

## 2023-10-22 DIAGNOSIS — G8911 Acute pain due to trauma: Secondary | ICD-10-CM | POA: Diagnosis not present

## 2023-10-22 DIAGNOSIS — G8929 Other chronic pain: Secondary | ICD-10-CM | POA: Diagnosis not present

## 2023-10-22 DIAGNOSIS — E871 Hypo-osmolality and hyponatremia: Secondary | ICD-10-CM | POA: Diagnosis not present

## 2023-10-25 ENCOUNTER — Telehealth: Payer: Self-pay | Admitting: Orthopedic Surgery

## 2023-10-25 ENCOUNTER — Other Ambulatory Visit: Payer: Self-pay | Admitting: Radiology

## 2023-10-25 DIAGNOSIS — R350 Frequency of micturition: Secondary | ICD-10-CM | POA: Diagnosis not present

## 2023-10-25 DIAGNOSIS — M549 Dorsalgia, unspecified: Secondary | ICD-10-CM

## 2023-10-25 DIAGNOSIS — S329XXA Fracture of unspecified parts of lumbosacral spine and pelvis, initial encounter for closed fracture: Secondary | ICD-10-CM

## 2023-10-25 DIAGNOSIS — R300 Dysuria: Secondary | ICD-10-CM | POA: Diagnosis not present

## 2023-10-25 DIAGNOSIS — M545 Low back pain, unspecified: Secondary | ICD-10-CM

## 2023-10-25 NOTE — Telephone Encounter (Signed)
 Pt called stating she had a referral sent to pain management and would like to know if any updates as to when they will call. Explained to pt pain management takes a few weeks but pt is asked for a call from [employer]. Pt phone number is [phone].

## 2023-10-25 NOTE — Telephone Encounter (Signed)
 I called and advised that the referral for pain management was placed but that it would take several weeks to get in, I also advised that she need to give our office time to get the information sent to them. She states that she understands, referral placed to [facility], and the phone number was given to patient to call them if she has not heard anything after 2 weeks.

## 2023-10-26 DIAGNOSIS — F419 Anxiety disorder, unspecified: Secondary | ICD-10-CM | POA: Diagnosis not present

## 2023-10-26 DIAGNOSIS — F330 Major depressive disorder, recurrent, mild: Secondary | ICD-10-CM | POA: Diagnosis not present

## 2023-10-29 DIAGNOSIS — I10 Essential (primary) hypertension: Secondary | ICD-10-CM | POA: Diagnosis not present

## 2023-10-29 DIAGNOSIS — G8929 Other chronic pain: Secondary | ICD-10-CM | POA: Diagnosis not present

## 2023-10-29 DIAGNOSIS — R300 Dysuria: Secondary | ICD-10-CM | POA: Diagnosis not present

## 2023-10-30 DIAGNOSIS — R300 Dysuria: Secondary | ICD-10-CM | POA: Diagnosis not present

## 2023-10-31 DIAGNOSIS — F330 Major depressive disorder, recurrent, mild: Secondary | ICD-10-CM | POA: Diagnosis not present

## 2023-10-31 DIAGNOSIS — F5101 Primary insomnia: Secondary | ICD-10-CM | POA: Diagnosis not present

## 2023-11-01 DIAGNOSIS — R300 Dysuria: Secondary | ICD-10-CM | POA: Diagnosis not present

## 2023-11-05 DIAGNOSIS — R300 Dysuria: Secondary | ICD-10-CM | POA: Diagnosis not present

## 2023-11-05 DIAGNOSIS — N390 Urinary tract infection, site not specified: Secondary | ICD-10-CM | POA: Diagnosis not present

## 2023-11-05 DIAGNOSIS — G8911 Acute pain due to trauma: Secondary | ICD-10-CM | POA: Diagnosis not present

## 2023-11-05 DIAGNOSIS — G8929 Other chronic pain: Secondary | ICD-10-CM | POA: Diagnosis not present

## 2023-11-09 MED ORDER — IOTHALAMATE MEGLUMINE 17.2 % UR SOLN
200.0000 mL | Freq: Once | URETHRAL | Status: AC | PRN
  Administered 2023-09-28: 200 mL via INTRAVESICAL

## 2023-11-09 NOTE — Addendum Note (Signed)
 Encounter addended by: Janit Perkins D on: 11/09/2023 2:14 PM  Actions taken: Imaging Exam ended, Order list changed, MAR administration accepted, Charge Capture section accepted

## 2023-11-09 NOTE — Addendum Note (Signed)
 Encounter addended by: Janit Perkins D on: 11/09/2023 2:10 PM  Actions taken: Imaging Exam ended

## 2023-11-12 ENCOUNTER — Encounter: Payer: Self-pay | Admitting: Radiology

## 2023-11-14 DIAGNOSIS — M800B2D Age-related osteoporosis with current pathological fracture, left pelvis, subsequent encounter for fracture with routine healing: Secondary | ICD-10-CM | POA: Diagnosis not present

## 2023-11-14 DIAGNOSIS — M47817 Spondylosis without myelopathy or radiculopathy, lumbosacral region: Secondary | ICD-10-CM | POA: Diagnosis not present

## 2023-11-14 DIAGNOSIS — M800B1D Age-related osteoporosis with current pathological fracture, right pelvis, subsequent encounter for fracture with routine healing: Secondary | ICD-10-CM | POA: Diagnosis not present

## 2023-11-14 DIAGNOSIS — Z87891 Personal history of nicotine dependence: Secondary | ICD-10-CM | POA: Diagnosis not present

## 2023-11-14 DIAGNOSIS — E785 Hyperlipidemia, unspecified: Secondary | ICD-10-CM | POA: Diagnosis not present

## 2023-11-14 DIAGNOSIS — G8929 Other chronic pain: Secondary | ICD-10-CM | POA: Diagnosis not present

## 2023-11-14 DIAGNOSIS — F32A Depression, unspecified: Secondary | ICD-10-CM | POA: Diagnosis not present

## 2023-11-14 DIAGNOSIS — I10 Essential (primary) hypertension: Secondary | ICD-10-CM | POA: Diagnosis not present

## 2023-11-14 DIAGNOSIS — M48061 Spinal stenosis, lumbar region without neurogenic claudication: Secondary | ICD-10-CM | POA: Diagnosis not present

## 2023-11-14 DIAGNOSIS — M1990 Unspecified osteoarthritis, unspecified site: Secondary | ICD-10-CM | POA: Diagnosis not present

## 2023-11-14 DIAGNOSIS — Z9181 History of falling: Secondary | ICD-10-CM | POA: Diagnosis not present

## 2023-11-14 DIAGNOSIS — Q675 Congenital deformity of spine: Secondary | ICD-10-CM | POA: Diagnosis not present

## 2023-11-14 DIAGNOSIS — N390 Urinary tract infection, site not specified: Secondary | ICD-10-CM | POA: Diagnosis not present

## 2023-11-14 DIAGNOSIS — Z96651 Presence of right artificial knee joint: Secondary | ICD-10-CM | POA: Diagnosis not present

## 2023-11-14 DIAGNOSIS — G4730 Sleep apnea, unspecified: Secondary | ICD-10-CM | POA: Diagnosis not present

## 2023-11-14 DIAGNOSIS — Z96641 Presence of right artificial hip joint: Secondary | ICD-10-CM | POA: Diagnosis not present

## 2023-11-14 DIAGNOSIS — D649 Anemia, unspecified: Secondary | ICD-10-CM | POA: Diagnosis not present

## 2023-11-16 DIAGNOSIS — F419 Anxiety disorder, unspecified: Secondary | ICD-10-CM | POA: Diagnosis not present

## 2023-11-16 DIAGNOSIS — F330 Major depressive disorder, recurrent, mild: Secondary | ICD-10-CM | POA: Diagnosis not present

## 2023-11-19 DIAGNOSIS — D649 Anemia, unspecified: Secondary | ICD-10-CM | POA: Diagnosis not present

## 2023-11-19 DIAGNOSIS — I10 Essential (primary) hypertension: Secondary | ICD-10-CM | POA: Diagnosis not present

## 2023-11-19 DIAGNOSIS — N390 Urinary tract infection, site not specified: Secondary | ICD-10-CM | POA: Diagnosis not present

## 2023-11-19 DIAGNOSIS — G8929 Other chronic pain: Secondary | ICD-10-CM | POA: Diagnosis not present

## 2023-11-19 DIAGNOSIS — F32A Depression, unspecified: Secondary | ICD-10-CM | POA: Diagnosis not present

## 2023-11-19 DIAGNOSIS — G4730 Sleep apnea, unspecified: Secondary | ICD-10-CM | POA: Diagnosis not present

## 2023-11-19 DIAGNOSIS — R3915 Urgency of urination: Secondary | ICD-10-CM | POA: Diagnosis not present

## 2023-11-20 DIAGNOSIS — D649 Anemia, unspecified: Secondary | ICD-10-CM | POA: Diagnosis not present

## 2023-11-20 DIAGNOSIS — I10 Essential (primary) hypertension: Secondary | ICD-10-CM | POA: Diagnosis not present

## 2023-11-20 DIAGNOSIS — G8929 Other chronic pain: Secondary | ICD-10-CM | POA: Diagnosis not present

## 2023-11-20 DIAGNOSIS — F32A Depression, unspecified: Secondary | ICD-10-CM | POA: Diagnosis not present

## 2023-11-20 DIAGNOSIS — N390 Urinary tract infection, site not specified: Secondary | ICD-10-CM | POA: Diagnosis not present

## 2023-11-20 DIAGNOSIS — G4730 Sleep apnea, unspecified: Secondary | ICD-10-CM | POA: Diagnosis not present

## 2023-11-21 DIAGNOSIS — I10 Essential (primary) hypertension: Secondary | ICD-10-CM | POA: Diagnosis not present

## 2023-11-21 DIAGNOSIS — M8000XA Age-related osteoporosis with current pathological fracture, unspecified site, initial encounter for fracture: Secondary | ICD-10-CM | POA: Diagnosis not present

## 2023-11-21 DIAGNOSIS — F32A Depression, unspecified: Secondary | ICD-10-CM | POA: Diagnosis not present

## 2023-11-21 DIAGNOSIS — E559 Vitamin D deficiency, unspecified: Secondary | ICD-10-CM | POA: Diagnosis not present

## 2023-11-21 DIAGNOSIS — Z6831 Body mass index (BMI) 31.0-31.9, adult: Secondary | ICD-10-CM | POA: Diagnosis not present

## 2023-11-21 DIAGNOSIS — N390 Urinary tract infection, site not specified: Secondary | ICD-10-CM | POA: Diagnosis not present

## 2023-11-21 DIAGNOSIS — G8929 Other chronic pain: Secondary | ICD-10-CM | POA: Diagnosis not present

## 2023-11-21 DIAGNOSIS — G4730 Sleep apnea, unspecified: Secondary | ICD-10-CM | POA: Diagnosis not present

## 2023-11-21 DIAGNOSIS — D649 Anemia, unspecified: Secondary | ICD-10-CM | POA: Diagnosis not present

## 2023-11-22 DIAGNOSIS — I10 Essential (primary) hypertension: Secondary | ICD-10-CM | POA: Diagnosis not present

## 2023-11-22 DIAGNOSIS — G8929 Other chronic pain: Secondary | ICD-10-CM | POA: Diagnosis not present

## 2023-11-22 DIAGNOSIS — F32A Depression, unspecified: Secondary | ICD-10-CM | POA: Diagnosis not present

## 2023-11-22 DIAGNOSIS — D649 Anemia, unspecified: Secondary | ICD-10-CM | POA: Diagnosis not present

## 2023-11-22 DIAGNOSIS — N390 Urinary tract infection, site not specified: Secondary | ICD-10-CM | POA: Diagnosis not present

## 2023-11-22 DIAGNOSIS — G4730 Sleep apnea, unspecified: Secondary | ICD-10-CM | POA: Diagnosis not present

## 2023-11-23 DIAGNOSIS — F32A Depression, unspecified: Secondary | ICD-10-CM | POA: Diagnosis not present

## 2023-11-23 DIAGNOSIS — G4730 Sleep apnea, unspecified: Secondary | ICD-10-CM | POA: Diagnosis not present

## 2023-11-23 DIAGNOSIS — N390 Urinary tract infection, site not specified: Secondary | ICD-10-CM | POA: Diagnosis not present

## 2023-11-23 DIAGNOSIS — I10 Essential (primary) hypertension: Secondary | ICD-10-CM | POA: Diagnosis not present

## 2023-11-23 DIAGNOSIS — G8929 Other chronic pain: Secondary | ICD-10-CM | POA: Diagnosis not present

## 2023-11-23 DIAGNOSIS — D649 Anemia, unspecified: Secondary | ICD-10-CM | POA: Diagnosis not present

## 2023-11-26 ENCOUNTER — Other Ambulatory Visit (HOSPITAL_BASED_OUTPATIENT_CLINIC_OR_DEPARTMENT_OTHER): Payer: Self-pay | Admitting: Family Medicine

## 2023-11-26 DIAGNOSIS — F32A Depression, unspecified: Secondary | ICD-10-CM | POA: Diagnosis not present

## 2023-11-26 DIAGNOSIS — N390 Urinary tract infection, site not specified: Secondary | ICD-10-CM | POA: Diagnosis not present

## 2023-11-26 DIAGNOSIS — I10 Essential (primary) hypertension: Secondary | ICD-10-CM | POA: Diagnosis not present

## 2023-11-26 DIAGNOSIS — D649 Anemia, unspecified: Secondary | ICD-10-CM | POA: Diagnosis not present

## 2023-11-26 DIAGNOSIS — M810 Age-related osteoporosis without current pathological fracture: Secondary | ICD-10-CM

## 2023-11-26 DIAGNOSIS — G8929 Other chronic pain: Secondary | ICD-10-CM | POA: Diagnosis not present

## 2023-11-26 DIAGNOSIS — G4730 Sleep apnea, unspecified: Secondary | ICD-10-CM | POA: Diagnosis not present

## 2023-11-27 DIAGNOSIS — N390 Urinary tract infection, site not specified: Secondary | ICD-10-CM | POA: Diagnosis not present

## 2023-11-27 DIAGNOSIS — G8929 Other chronic pain: Secondary | ICD-10-CM | POA: Diagnosis not present

## 2023-11-27 DIAGNOSIS — D649 Anemia, unspecified: Secondary | ICD-10-CM | POA: Diagnosis not present

## 2023-11-27 DIAGNOSIS — F32A Depression, unspecified: Secondary | ICD-10-CM | POA: Diagnosis not present

## 2023-11-27 DIAGNOSIS — I10 Essential (primary) hypertension: Secondary | ICD-10-CM | POA: Diagnosis not present

## 2023-11-27 DIAGNOSIS — G4730 Sleep apnea, unspecified: Secondary | ICD-10-CM | POA: Diagnosis not present

## 2023-11-28 ENCOUNTER — Other Ambulatory Visit (INDEPENDENT_AMBULATORY_CARE_PROVIDER_SITE_OTHER): Payer: Self-pay

## 2023-11-28 ENCOUNTER — Ambulatory Visit: Admitting: Orthopedic Surgery

## 2023-11-28 DIAGNOSIS — S329XXA Fracture of unspecified parts of lumbosacral spine and pelvis, initial encounter for closed fracture: Secondary | ICD-10-CM | POA: Diagnosis not present

## 2023-11-28 MED ORDER — METHOCARBAMOL 750 MG PO TABS: 750.0000 mg | ORAL_TABLET | Freq: Three times a day (TID) | ORAL | 2 refills | Status: AC

## 2023-11-28 NOTE — Progress Notes (Signed)
 Orthopedic Surgery Progress Note     Assessment: Patient is a 78 y.o. female with pelvic ring injury Date of injury: 09/06/2023 (~3 months from date of injury)     Plan: -Patient is doing much better.  She is still having some left posterior hip pain that seems to be near the SI joint.  Her pain is well-controlled with methocarbamol  750 mg and ibuprofen , so we will continue this for now -Weightbearing as tolerated -Pain control -Patient should return to the office in 6 weeks, x-rays at next visit: AP/inlet/outlet pelvis   ___________________________________________________________________________   History: Patient has noticed significant improvement since she was last in the office.  Patient was previously using a wheelchair for most of her mobility but is now ambulating with a walker.  Her pain has gotten significantly better.  She is no longer taking any narcotics.  She is using methocarbamol  and ibuprofen  to control her pain.  She is no longer having any pain radiating down the lower extremities.  She is just having pain in the posterior buttock on the left side.  She notes this pain mostly with weightbearing.  She does not of the pain at rest.  She is now back at home and has left the skilled nursing facility.     Physical Exam:   General: no acute distress, appears stated age Neurologic: alert, answering questions appropriately, following commands Respiratory: unlabored breathing on room air, symmetric chest rise Psychiatric: appropriate affect, normal cadence to speech   MSK:    -Bilateral lower extremity             No pain through range of motion at the hip, negative Stinchfield, ambulating with walker left lower EHL/TA/GSC intact Sensation intact to light touch in sural, saphenous, tibial, deep peroneal, and superficial peroneal nerve distributions Foot warm and well perfused     Imaging:  XRs of the pelvis from 11/28/2023 were independently reviewed and interpreted,  showing displaced superior and inferior rami fractures bilaterally.  There has been interval callus formation seen on the inner inferior rami.  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  No new fracture seen.  Alignment unchanged since films on 10/17/2023.     Patient name: [name] Patient MRN: [id number] Date: 11/28/23

## 2023-11-29 ENCOUNTER — Other Ambulatory Visit (HOSPITAL_BASED_OUTPATIENT_CLINIC_OR_DEPARTMENT_OTHER)

## 2023-11-29 ENCOUNTER — Encounter: Payer: Self-pay | Admitting: Orthopedic Surgery

## 2023-11-29 DIAGNOSIS — G4730 Sleep apnea, unspecified: Secondary | ICD-10-CM | POA: Diagnosis not present

## 2023-11-29 DIAGNOSIS — D649 Anemia, unspecified: Secondary | ICD-10-CM | POA: Diagnosis not present

## 2023-11-29 DIAGNOSIS — I10 Essential (primary) hypertension: Secondary | ICD-10-CM | POA: Diagnosis not present

## 2023-11-29 DIAGNOSIS — G8929 Other chronic pain: Secondary | ICD-10-CM | POA: Diagnosis not present

## 2023-11-29 DIAGNOSIS — N390 Urinary tract infection, site not specified: Secondary | ICD-10-CM | POA: Diagnosis not present

## 2023-11-29 DIAGNOSIS — F32A Depression, unspecified: Secondary | ICD-10-CM | POA: Diagnosis not present

## 2023-12-03 ENCOUNTER — Ambulatory Visit (HOSPITAL_BASED_OUTPATIENT_CLINIC_OR_DEPARTMENT_OTHER)
Admission: RE | Admit: 2023-12-03 | Discharge: 2023-12-03 | Disposition: A | Discharge status: Home / Self Care | Source: Ambulatory Visit | Attending: Family Medicine | Admitting: Family Medicine

## 2023-12-03 DIAGNOSIS — I10 Essential (primary) hypertension: Secondary | ICD-10-CM | POA: Diagnosis not present

## 2023-12-03 DIAGNOSIS — M810 Age-related osteoporosis without current pathological fracture: Secondary | ICD-10-CM | POA: Diagnosis not present

## 2023-12-03 DIAGNOSIS — N390 Urinary tract infection, site not specified: Secondary | ICD-10-CM | POA: Diagnosis not present

## 2023-12-03 DIAGNOSIS — M8589 Other specified disorders of bone density and structure, multiple sites: Secondary | ICD-10-CM | POA: Diagnosis not present

## 2023-12-03 DIAGNOSIS — G8929 Other chronic pain: Secondary | ICD-10-CM | POA: Diagnosis not present

## 2023-12-03 DIAGNOSIS — F32A Depression, unspecified: Secondary | ICD-10-CM | POA: Diagnosis not present

## 2023-12-03 DIAGNOSIS — G4730 Sleep apnea, unspecified: Secondary | ICD-10-CM | POA: Diagnosis not present

## 2023-12-03 DIAGNOSIS — D649 Anemia, unspecified: Secondary | ICD-10-CM | POA: Diagnosis not present

## 2023-12-03 DIAGNOSIS — Z780 Asymptomatic menopausal state: Secondary | ICD-10-CM | POA: Diagnosis not present

## 2023-12-04 DIAGNOSIS — D649 Anemia, unspecified: Secondary | ICD-10-CM | POA: Diagnosis not present

## 2023-12-04 DIAGNOSIS — G4730 Sleep apnea, unspecified: Secondary | ICD-10-CM | POA: Diagnosis not present

## 2023-12-04 DIAGNOSIS — N390 Urinary tract infection, site not specified: Secondary | ICD-10-CM | POA: Diagnosis not present

## 2023-12-04 DIAGNOSIS — I10 Essential (primary) hypertension: Secondary | ICD-10-CM | POA: Diagnosis not present

## 2023-12-04 DIAGNOSIS — G8929 Other chronic pain: Secondary | ICD-10-CM | POA: Diagnosis not present

## 2023-12-04 DIAGNOSIS — F32A Depression, unspecified: Secondary | ICD-10-CM | POA: Diagnosis not present

## 2023-12-10 DIAGNOSIS — F32A Depression, unspecified: Secondary | ICD-10-CM | POA: Diagnosis not present

## 2023-12-10 DIAGNOSIS — I10 Essential (primary) hypertension: Secondary | ICD-10-CM | POA: Diagnosis not present

## 2023-12-10 DIAGNOSIS — N390 Urinary tract infection, site not specified: Secondary | ICD-10-CM | POA: Diagnosis not present

## 2023-12-10 DIAGNOSIS — G8929 Other chronic pain: Secondary | ICD-10-CM | POA: Diagnosis not present

## 2023-12-10 DIAGNOSIS — G4730 Sleep apnea, unspecified: Secondary | ICD-10-CM | POA: Diagnosis not present

## 2023-12-10 DIAGNOSIS — D649 Anemia, unspecified: Secondary | ICD-10-CM | POA: Diagnosis not present

## 2023-12-11 DIAGNOSIS — Z1231 Encounter for screening mammogram for malignant neoplasm of breast: Secondary | ICD-10-CM | POA: Diagnosis not present

## 2023-12-12 DIAGNOSIS — F32A Depression, unspecified: Secondary | ICD-10-CM | POA: Diagnosis not present

## 2023-12-12 DIAGNOSIS — G8929 Other chronic pain: Secondary | ICD-10-CM | POA: Diagnosis not present

## 2023-12-12 DIAGNOSIS — D649 Anemia, unspecified: Secondary | ICD-10-CM | POA: Diagnosis not present

## 2023-12-12 DIAGNOSIS — N390 Urinary tract infection, site not specified: Secondary | ICD-10-CM | POA: Diagnosis not present

## 2023-12-12 DIAGNOSIS — G4730 Sleep apnea, unspecified: Secondary | ICD-10-CM | POA: Diagnosis not present

## 2023-12-12 DIAGNOSIS — I10 Essential (primary) hypertension: Secondary | ICD-10-CM | POA: Diagnosis not present

## 2023-12-13 DIAGNOSIS — D649 Anemia, unspecified: Secondary | ICD-10-CM | POA: Diagnosis not present

## 2023-12-13 DIAGNOSIS — F32A Depression, unspecified: Secondary | ICD-10-CM | POA: Diagnosis not present

## 2023-12-13 DIAGNOSIS — G4730 Sleep apnea, unspecified: Secondary | ICD-10-CM | POA: Diagnosis not present

## 2023-12-13 DIAGNOSIS — F330 Major depressive disorder, recurrent, mild: Secondary | ICD-10-CM | POA: Diagnosis not present

## 2023-12-13 DIAGNOSIS — I10 Essential (primary) hypertension: Secondary | ICD-10-CM | POA: Diagnosis not present

## 2023-12-13 DIAGNOSIS — F419 Anxiety disorder, unspecified: Secondary | ICD-10-CM | POA: Diagnosis not present

## 2023-12-13 DIAGNOSIS — G8929 Other chronic pain: Secondary | ICD-10-CM | POA: Diagnosis not present

## 2023-12-13 DIAGNOSIS — N390 Urinary tract infection, site not specified: Secondary | ICD-10-CM | POA: Diagnosis not present

## 2023-12-14 ENCOUNTER — Telehealth: Payer: Self-pay | Admitting: Orthopedic Surgery

## 2023-12-14 DIAGNOSIS — M8000XD Age-related osteoporosis with current pathological fracture, unspecified site, subsequent encounter for fracture with routine healing: Secondary | ICD-10-CM | POA: Diagnosis not present

## 2023-12-14 DIAGNOSIS — Z6831 Body mass index (BMI) 31.0-31.9, adult: Secondary | ICD-10-CM | POA: Diagnosis not present

## 2023-12-14 DIAGNOSIS — R0981 Nasal congestion: Secondary | ICD-10-CM | POA: Diagnosis not present

## 2023-12-14 DIAGNOSIS — R058 Other specified cough: Secondary | ICD-10-CM | POA: Diagnosis not present

## 2023-12-14 NOTE — Telephone Encounter (Signed)
 Denise with [employer] called wanting to know what the status is on the fax that sent over on 11/21 and 21/1. Call back number is [phone].

## 2023-12-17 NOTE — Telephone Encounter (Signed)
 This is the form that you faxed twice. Please call back.

## 2023-12-17 NOTE — Telephone Encounter (Signed)
 I called Karna and advised that this form has ben faxed at least 4-5 times. She gave me a different fax number to try. [phone]

## 2023-12-25 DIAGNOSIS — R0981 Nasal congestion: Secondary | ICD-10-CM | POA: Diagnosis not present

## 2023-12-25 DIAGNOSIS — M8000XD Age-related osteoporosis with current pathological fracture, unspecified site, subsequent encounter for fracture with routine healing: Secondary | ICD-10-CM | POA: Diagnosis not present

## 2023-12-25 DIAGNOSIS — R058 Other specified cough: Secondary | ICD-10-CM | POA: Diagnosis not present

## 2023-12-26 ENCOUNTER — Encounter (INDEPENDENT_AMBULATORY_CARE_PROVIDER_SITE_OTHER): Payer: Self-pay

## 2024-01-09 ENCOUNTER — Other Ambulatory Visit: Payer: Self-pay

## 2024-01-09 ENCOUNTER — Ambulatory Visit: Admitting: Orthopedic Surgery

## 2024-01-09 DIAGNOSIS — S329XXA Fracture of unspecified parts of lumbosacral spine and pelvis, initial encounter for closed fracture: Secondary | ICD-10-CM

## 2024-01-09 NOTE — Progress Notes (Signed)
 Orthopedic Surgery Progress Note     Assessment: Patient is a 78 y.o. female with pelvic ring injury Date of injury: 09/06/2023 (~4 months from date of injury)     Plan: -Since patient is doing better, will continue with non-operative treatment -In regards to her thoracic spine pain, it is well-controlled with ibuprofen  and methocarbamol .  She should continue those.  I told her that she could use Tylenol  to decrease her use of ibuprofen .  I told her would be safe to alternate those medications.  She is currently taking 600 mg of ibuprofen  3 times daily.  I told her that if she does develop a flare of back pain, could use a Medrol  Dosepak.  She can call or MyChart me and I will send that in for her -Weightbearing as tolerated -Pain control: Ibuprofen , methocarbamol  -Patient should return to the office in 8 weeks, x-rays at next visit: AP/inlet/outlet pelvis   ___________________________________________________________________________   History: Patient is not having significant pain in her groin or pelvis at this point.  She is weightbearing with a walker.  She sometimes notices pain when she sits particular on the left side near the ischial tuberosity.  She is having more pain near the mid and upper thoracic spine than she is in the pelvis or groin.  She has been taking ibuprofen  and muscle relaxers mostly for her thoracic spine but sometimes she needs it for pain particular if she is sitting for a while on the ischial tuberosity.  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     Physical Exam:   General: no acute distress, appears stated age Neurologic: alert, answering questions appropriately, following commands Respiratory: unlabored breathing on room air, symmetric chest rise Psychiatric: appropriate affect, normal cadence to speech   MSK:    -Bilateral lower extremity             No pain through range of motion at the hip, negative Stinchfield, ambulating with walker left  lower EHL/TA/GSC intact Sensation intact to light touch in sural, saphenous, tibial, deep peroneal, and superficial peroneal nerve distributions Foot warm and well perfused     Imaging:  XRs of the pelvis from 01/09/2024 were independently reviewed and interpreted, showing a total hip arthroplasty on the right side.  No lucency seen around the cup or the stem.  Joint space narrowing seen in the left hip joint.  There are fractures of the superior and inferior rami bilaterally.  Callus formation is seen around the superior and inferior rami on the right.  Callus formation seen on the left inferior ramus.  No callus formation seen around the superior ramus on the left side.  The posterior pelvic ring appears intact. The left hemipelvis appears internally rotated.     Patient name: [name] Patient MRN: [id number] Date:  01/09/2024

## 2024-01-22 ENCOUNTER — Institutional Professional Consult (permissible substitution) (INDEPENDENT_AMBULATORY_CARE_PROVIDER_SITE_OTHER): Admitting: Physician Assistant

## 2024-01-22 ENCOUNTER — Encounter (INDEPENDENT_AMBULATORY_CARE_PROVIDER_SITE_OTHER): Payer: Self-pay

## 2024-03-05 ENCOUNTER — Ambulatory Visit: Admitting: Orthopedic Surgery
